# Patient Record
Sex: Female | Born: 1960
Health system: Southern US, Community
[De-identification: ages and names within clinical notes are randomized; demographics above are authoritative.]

## PROBLEM LIST (undated history)

## (undated) DIAGNOSIS — Z9889 Other specified postprocedural states: Secondary | ICD-10-CM

## (undated) DIAGNOSIS — N390 Urinary tract infection, site not specified: Secondary | ICD-10-CM

## (undated) DIAGNOSIS — J449 Chronic obstructive pulmonary disease, unspecified: Secondary | ICD-10-CM

## (undated) DIAGNOSIS — I890 Lymphedema, not elsewhere classified: Secondary | ICD-10-CM

## (undated) DIAGNOSIS — T8859XA Other complications of anesthesia, initial encounter: Secondary | ICD-10-CM

## (undated) DIAGNOSIS — K224 Dyskinesia of esophagus: Secondary | ICD-10-CM

## (undated) DIAGNOSIS — I480 Paroxysmal atrial fibrillation: Secondary | ICD-10-CM

## (undated) DIAGNOSIS — G4730 Sleep apnea, unspecified: Secondary | ICD-10-CM

## (undated) DIAGNOSIS — IMO0002 Reserved for concepts with insufficient information to code with codable children: Secondary | ICD-10-CM

## (undated) DIAGNOSIS — D649 Anemia, unspecified: Secondary | ICD-10-CM

## (undated) DIAGNOSIS — L4050 Arthropathic psoriasis, unspecified: Secondary | ICD-10-CM

## (undated) DIAGNOSIS — J189 Pneumonia, unspecified organism: Secondary | ICD-10-CM

## (undated) DIAGNOSIS — I10 Essential (primary) hypertension: Secondary | ICD-10-CM

## (undated) DIAGNOSIS — Z87898 Personal history of other specified conditions: Secondary | ICD-10-CM

## (undated) DIAGNOSIS — K3184 Gastroparesis: Secondary | ICD-10-CM

## (undated) DIAGNOSIS — R112 Nausea with vomiting, unspecified: Secondary | ICD-10-CM

## (undated) DIAGNOSIS — F32A Depression, unspecified: Secondary | ICD-10-CM

## (undated) DIAGNOSIS — M797 Fibromyalgia: Secondary | ICD-10-CM

## (undated) DIAGNOSIS — E119 Type 2 diabetes mellitus without complications: Secondary | ICD-10-CM

## (undated) DIAGNOSIS — I499 Cardiac arrhythmia, unspecified: Secondary | ICD-10-CM

## (undated) DIAGNOSIS — M1990 Unspecified osteoarthritis, unspecified site: Secondary | ICD-10-CM

## (undated) DIAGNOSIS — G894 Chronic pain syndrome: Secondary | ICD-10-CM

## (undated) DIAGNOSIS — K219 Gastro-esophageal reflux disease without esophagitis: Secondary | ICD-10-CM

## (undated) DIAGNOSIS — K589 Irritable bowel syndrome without diarrhea: Secondary | ICD-10-CM

## (undated) DIAGNOSIS — M069 Rheumatoid arthritis, unspecified: Secondary | ICD-10-CM

## (undated) DIAGNOSIS — N319 Neuromuscular dysfunction of bladder, unspecified: Secondary | ICD-10-CM

## (undated) DIAGNOSIS — F329 Major depressive disorder, single episode, unspecified: Secondary | ICD-10-CM

## (undated) HISTORY — PX: BACK SURGERY: SHX140

## (undated) HISTORY — PX: CHOLECYSTECTOMY: SHX55

## (undated) HISTORY — DX: Dyskinesia of esophagus: K22.4

## (undated) HISTORY — PX: KNEE SURGERY: SHX244

## (undated) HISTORY — DX: Lymphedema, not elsewhere classified: I89.0

## (undated) HISTORY — DX: Gastro-esophageal reflux disease without esophagitis: K21.9

## (undated) HISTORY — DX: Reserved for concepts with insufficient information to code with codable children: IMO0002

## (undated) HISTORY — DX: Unspecified osteoarthritis, unspecified site: M19.90

## (undated) HISTORY — PX: LUNG BIOPSY: SHX232

## (undated) HISTORY — PX: APPENDECTOMY: SHX54

## (undated) HISTORY — DX: Neuromuscular dysfunction of bladder, unspecified: N31.9

## (undated) HISTORY — DX: Irritable bowel syndrome, unspecified: K58.9

## (undated) HISTORY — PX: CARDIAC CATHETERIZATION: SHX172

## (undated) HISTORY — DX: Gastroparesis: K31.84

## (undated) HISTORY — DX: Chronic obstructive pulmonary disease, unspecified: J44.9

## (undated) HISTORY — DX: Paroxysmal atrial fibrillation: I48.0

## (undated) HISTORY — DX: Fibromyalgia: M79.7

## (undated) HISTORY — PX: COLONOSCOPY: SHX174

## (undated) HISTORY — DX: Essential (primary) hypertension: I10

## (undated) HISTORY — DX: Personal history of other specified conditions: Z87.898

## (undated) HISTORY — DX: Type 2 diabetes mellitus without complications: E11.9

## (undated) HISTORY — DX: Other specified postprocedural states: Z98.890

## (undated) HISTORY — PX: ABDOMINAL HYSTERECTOMY: SHX81

---

## 1994-01-02 HISTORY — PX: CHOLECYSTECTOMY: SHX55

## 1995-01-03 HISTORY — PX: ABDOMINAL HYSTERECTOMY: SHX81

## 1995-01-03 HISTORY — PX: KNEE SURGERY: SHX244

## 1997-09-02 HISTORY — PX: APPENDECTOMY: SHX54

## 2000-03-12 ENCOUNTER — Encounter: Payer: Self-pay | Admitting: Cardiovascular Disease

## 2000-03-12 ENCOUNTER — Inpatient Hospital Stay (HOSPITAL_COMMUNITY): Admission: EM | Admit: 2000-03-12 | Discharge: 2000-03-14 | Payer: Self-pay | Admitting: Emergency Medicine

## 2000-11-02 ENCOUNTER — Ambulatory Visit (HOSPITAL_COMMUNITY): Admission: RE | Admit: 2000-11-02 | Discharge: 2000-11-02 | Payer: Self-pay | Admitting: *Deleted

## 2001-07-12 ENCOUNTER — Ambulatory Visit (HOSPITAL_COMMUNITY): Admission: RE | Admit: 2001-07-12 | Discharge: 2001-07-12 | Payer: Self-pay | Admitting: Internal Medicine

## 2001-07-25 ENCOUNTER — Encounter: Payer: Self-pay | Admitting: Anesthesiology

## 2001-07-25 ENCOUNTER — Ambulatory Visit (HOSPITAL_COMMUNITY): Admission: RE | Admit: 2001-07-25 | Discharge: 2001-07-25 | Payer: Self-pay | Admitting: Anesthesiology

## 2002-03-09 ENCOUNTER — Ambulatory Visit (HOSPITAL_COMMUNITY): Admission: RE | Admit: 2002-03-09 | Discharge: 2002-03-09 | Payer: Self-pay | Admitting: Neurosurgery

## 2003-03-09 ENCOUNTER — Ambulatory Visit (HOSPITAL_COMMUNITY): Admission: RE | Admit: 2003-03-09 | Discharge: 2003-03-09 | Payer: Self-pay | Admitting: Neurosurgery

## 2003-03-12 ENCOUNTER — Emergency Department (HOSPITAL_COMMUNITY): Admission: EM | Admit: 2003-03-12 | Discharge: 2003-03-13 | Payer: Self-pay | Admitting: Emergency Medicine

## 2003-03-13 ENCOUNTER — Inpatient Hospital Stay (HOSPITAL_COMMUNITY): Admission: AD | Admit: 2003-03-13 | Discharge: 2003-03-13 | Payer: Self-pay | Admitting: Cardiology

## 2003-08-17 ENCOUNTER — Ambulatory Visit (HOSPITAL_COMMUNITY): Admission: RE | Admit: 2003-08-17 | Discharge: 2003-08-17 | Payer: Self-pay | Admitting: Internal Medicine

## 2004-05-20 ENCOUNTER — Ambulatory Visit: Payer: Self-pay | Admitting: Gastroenterology

## 2004-06-28 ENCOUNTER — Ambulatory Visit: Payer: Self-pay | Admitting: Internal Medicine

## 2004-08-30 ENCOUNTER — Ambulatory Visit: Payer: Self-pay | Admitting: Cardiology

## 2004-09-28 ENCOUNTER — Ambulatory Visit: Payer: Self-pay | Admitting: Internal Medicine

## 2004-10-12 ENCOUNTER — Encounter: Payer: Self-pay | Admitting: Internal Medicine

## 2004-10-12 ENCOUNTER — Ambulatory Visit (HOSPITAL_COMMUNITY): Admission: RE | Admit: 2004-10-12 | Discharge: 2004-10-12 | Payer: Self-pay | Admitting: Internal Medicine

## 2004-10-12 ENCOUNTER — Ambulatory Visit: Payer: Self-pay | Admitting: Internal Medicine

## 2004-10-12 HISTORY — PX: ESOPHAGOGASTRODUODENOSCOPY: SHX1529

## 2005-01-02 HISTORY — PX: LUNG BIOPSY: SHX232

## 2005-05-02 ENCOUNTER — Ambulatory Visit (HOSPITAL_COMMUNITY): Admission: RE | Admit: 2005-05-02 | Discharge: 2005-05-02 | Payer: Self-pay | Admitting: Thoracic Surgery

## 2005-05-02 ENCOUNTER — Encounter (INDEPENDENT_AMBULATORY_CARE_PROVIDER_SITE_OTHER): Payer: Self-pay | Admitting: *Deleted

## 2005-11-21 ENCOUNTER — Ambulatory Visit (HOSPITAL_COMMUNITY): Admission: RE | Admit: 2005-11-21 | Discharge: 2005-11-21 | Payer: Self-pay | Admitting: Neurosurgery

## 2006-03-28 ENCOUNTER — Ambulatory Visit (HOSPITAL_COMMUNITY): Admission: RE | Admit: 2006-03-28 | Discharge: 2006-03-28 | Payer: Self-pay | Admitting: Endocrinology

## 2007-06-13 ENCOUNTER — Ambulatory Visit: Payer: Self-pay | Admitting: Internal Medicine

## 2007-06-19 ENCOUNTER — Encounter (HOSPITAL_COMMUNITY): Admission: RE | Admit: 2007-06-19 | Discharge: 2007-07-19 | Payer: Self-pay | Admitting: Internal Medicine

## 2007-06-26 ENCOUNTER — Ambulatory Visit: Payer: Self-pay | Admitting: Internal Medicine

## 2007-07-11 ENCOUNTER — Ambulatory Visit: Payer: Self-pay | Admitting: Internal Medicine

## 2007-07-30 ENCOUNTER — Ambulatory Visit: Payer: Self-pay | Admitting: Gastroenterology

## 2007-08-06 ENCOUNTER — Ambulatory Visit: Payer: Self-pay | Admitting: Internal Medicine

## 2007-09-10 ENCOUNTER — Ambulatory Visit (HOSPITAL_COMMUNITY): Admission: RE | Admit: 2007-09-10 | Discharge: 2007-09-10 | Payer: Self-pay | Admitting: Neurosurgery

## 2007-10-02 ENCOUNTER — Inpatient Hospital Stay (HOSPITAL_COMMUNITY): Admission: RE | Admit: 2007-10-02 | Discharge: 2007-10-07 | Payer: Self-pay | Admitting: Neurosurgery

## 2007-10-02 DIAGNOSIS — R7309 Other abnormal glucose: Secondary | ICD-10-CM | POA: Insufficient documentation

## 2007-10-02 DIAGNOSIS — E1143 Type 2 diabetes mellitus with diabetic autonomic (poly)neuropathy: Secondary | ICD-10-CM | POA: Insufficient documentation

## 2007-10-02 DIAGNOSIS — K3184 Gastroparesis: Secondary | ICD-10-CM

## 2007-10-02 DIAGNOSIS — IMO0002 Reserved for concepts with insufficient information to code with codable children: Secondary | ICD-10-CM | POA: Insufficient documentation

## 2007-10-02 DIAGNOSIS — E739 Lactose intolerance, unspecified: Secondary | ICD-10-CM | POA: Insufficient documentation

## 2007-10-02 DIAGNOSIS — J4489 Other specified chronic obstructive pulmonary disease: Secondary | ICD-10-CM | POA: Insufficient documentation

## 2007-10-02 DIAGNOSIS — F329 Major depressive disorder, single episode, unspecified: Secondary | ICD-10-CM | POA: Insufficient documentation

## 2007-10-02 DIAGNOSIS — M1990 Unspecified osteoarthritis, unspecified site: Secondary | ICD-10-CM | POA: Insufficient documentation

## 2007-10-02 DIAGNOSIS — IMO0001 Reserved for inherently not codable concepts without codable children: Secondary | ICD-10-CM | POA: Insufficient documentation

## 2007-10-02 DIAGNOSIS — G4733 Obstructive sleep apnea (adult) (pediatric): Secondary | ICD-10-CM | POA: Insufficient documentation

## 2007-10-02 DIAGNOSIS — K589 Irritable bowel syndrome without diarrhea: Secondary | ICD-10-CM | POA: Insufficient documentation

## 2007-10-02 DIAGNOSIS — I201 Angina pectoris with documented spasm: Secondary | ICD-10-CM | POA: Insufficient documentation

## 2007-10-02 DIAGNOSIS — K219 Gastro-esophageal reflux disease without esophagitis: Secondary | ICD-10-CM | POA: Insufficient documentation

## 2007-10-02 DIAGNOSIS — R1110 Vomiting, unspecified: Secondary | ICD-10-CM | POA: Insufficient documentation

## 2007-10-02 DIAGNOSIS — J449 Chronic obstructive pulmonary disease, unspecified: Secondary | ICD-10-CM | POA: Insufficient documentation

## 2007-11-19 ENCOUNTER — Ambulatory Visit: Payer: Self-pay | Admitting: Internal Medicine

## 2008-05-15 ENCOUNTER — Encounter: Payer: Self-pay | Admitting: Internal Medicine

## 2008-06-02 ENCOUNTER — Ambulatory Visit: Payer: Self-pay | Admitting: Cardiology

## 2008-06-09 ENCOUNTER — Ambulatory Visit: Payer: Self-pay | Admitting: Cardiology

## 2008-06-14 ENCOUNTER — Encounter: Payer: Self-pay | Admitting: Cardiology

## 2008-06-29 ENCOUNTER — Encounter: Payer: Self-pay | Admitting: Physician Assistant

## 2008-06-29 ENCOUNTER — Ambulatory Visit: Payer: Self-pay | Admitting: Cardiology

## 2008-09-16 DIAGNOSIS — R079 Chest pain, unspecified: Secondary | ICD-10-CM | POA: Insufficient documentation

## 2008-10-24 ENCOUNTER — Encounter: Payer: Self-pay | Admitting: Cardiology

## 2010-01-02 HISTORY — PX: HAND SURGERY: SHX662

## 2010-01-23 ENCOUNTER — Encounter: Payer: Self-pay | Admitting: Neurological Surgery

## 2010-01-24 ENCOUNTER — Encounter: Payer: Self-pay | Admitting: Neurosurgery

## 2010-02-04 ENCOUNTER — Other Ambulatory Visit (HOSPITAL_COMMUNITY): Payer: Self-pay | Admitting: Neurosurgery

## 2010-02-04 DIAGNOSIS — M545 Low back pain, unspecified: Secondary | ICD-10-CM

## 2010-02-04 DIAGNOSIS — M542 Cervicalgia: Secondary | ICD-10-CM

## 2010-02-18 ENCOUNTER — Other Ambulatory Visit (HOSPITAL_COMMUNITY): Payer: Self-pay

## 2010-03-08 ENCOUNTER — Other Ambulatory Visit (HOSPITAL_COMMUNITY): Payer: Self-pay

## 2010-03-08 ENCOUNTER — Ambulatory Visit (HOSPITAL_COMMUNITY)
Admission: RE | Admit: 2010-03-08 | Discharge: 2010-03-08 | Disposition: A | Discharge status: Home / Self Care | Payer: Medicare Other | Source: Ambulatory Visit | Attending: Neurosurgery | Admitting: Neurosurgery

## 2010-03-08 DIAGNOSIS — M542 Cervicalgia: Secondary | ICD-10-CM

## 2010-03-08 DIAGNOSIS — M545 Low back pain, unspecified: Secondary | ICD-10-CM

## 2010-03-08 DIAGNOSIS — Q761 Klippel-Feil syndrome: Secondary | ICD-10-CM | POA: Insufficient documentation

## 2010-03-08 DIAGNOSIS — R209 Unspecified disturbances of skin sensation: Secondary | ICD-10-CM | POA: Insufficient documentation

## 2010-03-08 DIAGNOSIS — M5126 Other intervertebral disc displacement, lumbar region: Secondary | ICD-10-CM | POA: Insufficient documentation

## 2010-03-08 DIAGNOSIS — M4802 Spinal stenosis, cervical region: Secondary | ICD-10-CM | POA: Insufficient documentation

## 2010-03-08 DIAGNOSIS — Z981 Arthrodesis status: Secondary | ICD-10-CM | POA: Insufficient documentation

## 2010-03-08 MED ORDER — IOHEXOL 300 MG/ML  SOLN: 10.0000 mL | Freq: Once | INTRAMUSCULAR | Status: DC | PRN

## 2010-05-17 NOTE — Assessment & Plan Note (Signed)
NAMEMarland Kitchen  MERTIS, MOSHER                CHART#:  [id number]   DATE:  07/11/2007                       DOB:  02-Jun-1960   CHIEF COMPLAINT:  Vomiting.   SUBJECTIVE:  The patient is a pleasant, morbidly obese Caucasian female  who presents today in followup.  She was last seen on June 13, 2007.  She has a long history of gastroesophageal reflux disease and IBS.  She  had recently come off her Reglan due to tremors.  She had noticed since  that time, intermittent nausea and vomiting.  She also had bright red  reflux symptoms.  It was felt that she likely had underlying  gastroparesis, which was medication induced.  She had a solid-phase  gastric emptying study, which revealed 99% of the radiotracer remaining  in the stomach at 2 hours.  She also had LFTs done, which were normal  with a history of fatty liver.  Three Hemoccults were negative.  She has  tried gastroparesis diet.  She eats multiple small meals daily, but  continues to have intermittent vomiting, not daily, but several days a  week.  She also complains of early a.m. nausea even prior to meals or  medications.  Heartburn is not much of an issue at this point in time.  She is on Prilosec 20 mg b.i.d.  She continues to have some postprandial  vomiting up to and hour or hour and a half after meals consisting of  undigested food.  Denies any dysphagia or odynophagia, constipation,  diarrhea, melena, or rectal bleeding.   CURRENT MEDICATIONS:  Multiple, please see chart.   ALLERGIES:  Levsin and Reglan.   PHYSICAL EXAMINATION:  VITAL SIGNS:  Weight 280 pounds and stable, temp  98.1, blood pressure 110/60, and pulse 88.  GENERAL:  Pleasant, obese Caucasian female in no acute distress.  SKIN:  Warm and dry.  No jaundice.  HEENT:  Sclerae nonicteric.  ABDOMEN:  Massively obese.  Positive bowel sounds.  Soft and nontender.  EXTREMITIES:  Trace edema bilaterally.   IMPRESSION:  The patient is a pleasant, morbidly obese 50 year old  lady  with a long-standing gastroesophageal reflux disease with persistent  intermittent nausea and vomiting.  She had very abnormal gastric  emptying study basically with 99% of the trace were present at 2 hours.  She is unable to tolerate Reglan due to tremor, but had been doing very  well on Reglan with regards to her gastroesophageal reflux disease and  vomiting.  She would likely benefit from erythromycin.  She is on  multiple medications, which of course can negatively impact gastric  motility as well as potentially interact with erythromycin.  I have  discussed with her extensively the potential interactions with her  medications.  She was advised to absolutely not take Phenergan with  erythromycin as it is an absolute contraindication.  We would give her  as low dose as possible that control  her symptoms.  If she has any  questions or notices any side effects, etc., she will stop erythromycin  and call us.   PLAN:  1. Erythromycin ethyl succinate 80 mg q.a.c., 2-week supply with zero      refills, do not take Phenergan.  2. Office visit in 2 weeks with Dr. Jena Gauss.  3. If she does not respond to EES she may need  EGD or UGI to rule out      obstruction as potential cause of delayed gastric empyting.  4. Further recommendations to follow.       Tana Coast, P.A.  Electronically Signed     Kassie Mends, M.D.  Electronically Signed    LL/MEDQ  D:  07/11/2007  T:  07/12/2007  Job:  [id number]   cc:   Doreen Beam, MD

## 2010-05-17 NOTE — Assessment & Plan Note (Signed)
NAMEMarland Kitchen  CRISTAL, QADIR                CHART#:  [id number]   DATE:  11/19/2007                       DOB:  February 16, 1960   SUBJECTIVE:  The patient is here for followup visit.  She was last seen  back on August 06, 2007, by Dr. Jena Gauss.  She has a history of  gastroesophageal reflux disease, gastroparesis, and IBS.  Since we last  saw her, she has had a back surgery with spinal fusion of L3-S1.  She is  temporarily on Percocet for pain.  She states she actually has less  problems with her gastroparesis.  She has had less frequent nausea, but  does tend to have early morning nausea fairly regularly.  She is able to  eat throughout the day, however.  She has had no vomiting.  Her reflux  is pretty well controlled.  She did not see any benefits to the  erythromycin 125 mg q.a.c.  She states she actually felt a little worse  on the medication.  She had a lot of problems taking the liquid and felt  more nauseated.  She continues to have alternating constipation and  diarrhea.  She may go 3 days without any bowel movement, then have a day  of 7-day loose stools and a lot of abdominal cramping.  Currently, she  is not taking her MiraLax or lactulose.  She takes Bentyl about 2-3  times a day on the day she has diarrhea.   CURRENT MEDICATIONS:  Please see updated list.   ALLERGIES:  Levsin causes tachycardia and Reglan causes tremor.   PHYSICAL EXAMINATION:  VITAL SIGNS:  Weight 294, up 9 pounds.  Temp  97.9, blood pressure 122/72, and pulse 80.  GENERAL:  A pleasant, obese Caucasian female in no acute distress.  She  is accompanied by her CNA.  SKIN:  Warm and dry.  No jaundice.  HEENT:  Sclerae nonicteric.  Oropharyngeal mucosa moist and pink.  ABDOMEN:  Positive bowel sounds.  Abdomen is obese, soft, and nontender.  No organomegaly or masses.  No rebound or guarding.  No abdominal bruits  or hernias, somewhat limited due to body habitus.  LOWER EXTREMITIES:  Trace pedal edema bilaterally.   IMPRESSION:  The patient is a 50 year old lady with history of  gastroesophageal reflux disease and gastroparesis.  Gastroparesis felt  to be multifactorial and drug-induced in origin.  She continues to have  intermittent nausea, but as not as frequent vomiting.  She was not able  to tolerate Reglan due to side effects.  She developed tremors on the  medication.  She did not appreciate any benefit on erythromycin  ethylsuccinate suspension at 125 mg q.a.c.  I am not sure what else we  have to offer at this point.  There are so many of her medications that  are impeding her gastric emptying and gastrointestinal motility, which  is making it very difficult to overcome.  I discussed with Dr. Jena Gauss a  potential of referral to [facility] ([place]  for evaluation.  Domperidone may be an option, given her polypharmacy  would be concerned with potential drug interactions with any additional  medication.  She continues to have swings between constipation, diarrhea  likely due to her irritable bowel syndrome and polypharmacy as well.   PLAN:  1. MiraLax 17 g daily.  She is to take in the evening time everyday      unless she has diarrhea that day.  She may continue to use Bentyl 2-      3 times daily on days that she has abdominal cramps and diarrhea.      Seven days samples and coupon provided for MiraLax.  2. We will discuss further with Dr. Jena Gauss and potentially make      referral to [facility] to help      with management for her severe gastroparesis.       Tana Coast, P.A.  Electronically Signed     R. Roetta Sessions, M.D.  Electronically Signed    LL/MEDQ  D:  11/19/2007  T:  11/19/2007  Job:  [id number]   cc:   Kirstie Peri, MD

## 2010-05-17 NOTE — Op Note (Signed)
[name] [name], [name] [name]               ACCOUNT NO.:  [id number]   MEDICAL RECORD NO.:  [id number]          PATIENT TYPE:  INP   LOCATION:  3032                         FACILITY:  [facility]   PHYSICIAN:  Cristi Loron, M.D.DATE OF BIRTH:  03/23/1960   DATE OF PROCEDURE:  10/02/2007  DATE OF DISCHARGE:                               OPERATIVE REPORT   BRIEF HISTORY:  The patient is a 50 year old white female who has  previously undergone a L4-5 and L5-S1 lumbar decompression and fusion by  another physician in 1991.  The patient had some chronic back pain but  more recently it has increased and she has symptoms consistent with  neurogenic claudication.  I worked the patient up with a lumbar Myo/CT  which demonstrated that the patient may have had a pseudoarthrosis at L4-  5 and possibly L5-S1 and has developed significant adjacent segment  degenerative changes at L3-4 with spondylolisthesis and spinal stenosis  and facet arthropathy.  I discussed various treatment options with the  patient including surgery.  She has weighed the risks, benefits and  alternatives of surgery and decided to proceed with exploration of a  lumbar fusion with a decompression, fusion and instrumentation.   PREOPERATIVE DIAGNOSES:  L4-5 and possible L5-S1 pseudoarthrosis; L3-4  degenerative disk disease, stenosis, lumbar radiculopathy, myelopathy,  and lumbago.   POSTOPERATIVE DIAGNOSES:  L4-5 and possible L5-S1 pseudoarthrosis; L3-4  degenerative disk disease, stenosis, lumbar radiculopathy, myelopathy,  and lumbago.   PROCEDURE:  Exploration of lumbar fusion; removal of old  Steffee  instrumentation; bilateral L3-L4 laminotomies and foraminotomies to  decompress the bilateral L3, L4, and L5 nerve roots; L3-4 and L4-5  transforaminal lumbar interbody fusion with local morselized autograft  bone and Vitoss/Actifuse bone graft extender; insertion of L3-4 and L4-5  interbody prosthesis (PEEK interbody  prosthesis); posterior segmental  instrumentation L3 to S1 with Legacy titanium pedicle screws and rods;  L3-4, L4-5 and L5-S1 posterolateral arthrodesis with local morselized  autograft bone, Actifuse and Vitoss bone graft extenders as well as bone  morphogenic protein soaked collagen sponges.   SURGEON:  Cristi Loron, MD   ASSISTANT:  Hewitt Shorts, MD   ANESTHESIA:  General endotracheal.   ESTIMATED BLOOD LOSS:  600 mL.   SPECIMENS:  None.   DRAINS:  None.   COMPLICATIONS:  None.   DESCRIPTION OF PROCEDURE:  The patient was brought to the operating room  by the anesthesia team.  General endotracheal anesthesia was induced.  The patient was turned to the prone position on the Wilson frame.  Her  lumbosacral region was then prepared with Betadine scrub and Betadine  solution.  Sterile drapes were applied.  I then injected the area to be  incised with Marcaine with epinephrine solution, used a scalpel to make  a linear midline incision through the patient's previous lumbar scar.  I  used electrocautery to perform a bilateral subperiosteal dissection  exposing the spinous process lamina of L2, L3, L4, L5 in the upper  sacrum.  Then we used electrocautery to expose the old hardware.  We  then inserted the Versa-Trac  retractor for exposure.  We began the  surgery by exploring the arthrodesis.  We used the various wrenches to  remove the old Steffee hardware.  This was quite time consuming.  As  expected, we noted that the bilateral L4 pedicles screws were fractured.  We removed the screw heads but could not see the deeper pedicle screw  within the vertebral body/pedicles.  Once we removed the hardware, we  inspected the arthrodesis.  The patient appeared to have pseudoarthrosis  at L4-5 with some motion but we could not tell whether L5-S1 was fused  or not.   We now turned our attention to the decompression.  Because of the  patient's severe facet arthropathy at L3-4  and L4-5, we need to do a  wide decompression of the thecal sac and the nerve roots.  I removed the  medial aspect of the facets and performed wide foraminotomies about the  bilateral L3, L4 and L5 nerve roots.  We used a high-speed drill to  perform a right L3 laminotomy and bilateral L4 laminotomies.  We widened  the lamina with Kerrison punches and carefully dissected through the  epidural fibrosis and decompressing the thecal sac and performed  foraminotomies about the bilateral L3, L4 and L5 nerve roots.  We were  able to decompress the bilateral nerve roots at L3-4 via unilateral  laminotomy by reaching across at midline and carefully reached across  the  midline with the Kerrison punches..   Having completed the decompression, we now turned our attention to the  transforaminal lumbar interbody fusion, removed the inferior facet at L3-  L4 and this gave [place] wide lateral exposure to the L3-4 and L4-5  intervertebral disk.  We incised these disks with the 15 blade scalpel  and performed a partial intervertebral diskectomy with the pituitary  forceps and Epstein curettes.  We prepared the vertebral endplates for  the fusion by removing all soft tissues using the curettes.  We used a  trial spacer to determine it using a 10 x 26 spacer at L4-5 and a 12 x  26 at L3-4.  We prefilled this prosthesis with a combination of BMP,  local autograft bone, Actifuse and Vitoss.  We also used these  substances to fill in the disk space ventrally at L3-4 and L4-5.  We  inserted the prosthesis into the interspace from the right side of  course after retracting neural structures out of harm's way.  We then  used a bone tap to turn the prosthesis laterally and then filled the  posterior disk space with local autograft bone, Vitoss, Actifuse and  bone morphogenic protein soaked collagen sponges.  This completed the  transforaminal lumbar interbody fusion.   We now turned our attention to  instrumentation.  We cannulated the  bilateral L3 pedicles with the bone probes under fluoroscopic guidance.  We tapped the pedicles with 5.5 Miller tap, probed inside the tapped  pedicles around cortical breeches and then inserted  7.5 x 55 mm pedicle  screws bilaterally at L3 and 7.5 x 50 bilaterally at L5 and 7.5 x 45  bilaterally at S1 but we did not attempt to put screws in the L4  pedicles because the drains has broken screws from old surgery.  We then  connected unilateral pedicle screws with a lordotic rod.  We compressed  the construct and then secured the rod in place with the caps, then  placed a cross connector and tightened it appropriately.  This completed  instrumentation.  We palpated along the medial aspect of the L3 and L4  pedicles and noted there was no cortical breeches and the nerve roots  were not injured.   We now turned our attention to arthrodesis.  We used a high-speed drill  to decorticate the remainder of the left L3-4, L4-5 and L5-S1 facets,  pars region, lateral masses and old fusion mass.  We then laid the bone  morphogenic protein soaked collagen sponges over these decorticated  posterolateral structures and then laid local autograft bone, Vitoss and  Actifuse over the BMP.  This completed the posterolateral arthrodesis.  We then inspected the thecal sac and noted that the thecal sac was well  decompressed at L3-4, L4-5 and about L3, L4 and L5 nerve roots were well  decompressed.  We then obtained hemostasis using bipolar cautery.  We  removed the retractor and then reapproximated the patient's  thoracolumbar fascia with interrupted #1 Vicryl suture, subcutaneous  with interrupted 2-0 Vicryl suture and the skin with Steri-Strips and  benzoin.  The wound was then coated with bacitracin ointment.  A sterile dressing was applied.  The drapes were removed and the patient  was subsequently returned to supine position where she was extubated by  the anesthesia  team and transported to the postanesthesia care unit in  stable condition.  All sponge, instrument and needle counts were correct  at the end of this case.      Cristi Loron, M.D.  Electronically Signed     JDJ/MEDQ  D:  10/03/2007  T:  10/04/2007  Job:  [id number]

## 2010-05-17 NOTE — Discharge Summary (Signed)
NAMELENOIR, FACCHINI               ACCOUNT NO.:  [id number]   MEDICAL RECORD NO.:  [id number]          PATIENT TYPE:  INP   LOCATION:  3032                         FACILITY:  [facility]   PHYSICIAN:  Cristi Loron, M.D.DATE OF BIRTH:  Dec 04, 1960   DATE OF ADMISSION:  10/02/2007  DATE OF DISCHARGE:  10/07/2007                               DISCHARGE SUMMARY   BRIEF HISTORY:  The patient is a 51 year old white female who has  previously undergone L4-L5, L5-S1 lumbar decompression and fusion by  another physician in 1991.  The patient has had chronic back pain, but  more recently it has increased and has developed symptoms consistent  with neurogenic claudication.  I have ordered the patient now for lumbar  myelo-CT, which demonstrated the patient may have osteoarthritis of L4-  L5 and possibly L5-S1 and has developed significant degenerative changes  at L3-L4 with spondylolisthesis, spinal stenosis, and facet arthropathy.  I discussed various treatment options with the patient.  She has weighed  the risk, benefits, alternatives of the surgery and decided to proceed  with an exploration of lumbar fusion with decompression, fusion, and  instrumentation of L3-L4.   For further details of this admission, please refer typed history and  physical.   HOSPITAL COURSE:  Admitted the patient to [facility] on  October 02, 2007.  On the day of admission, I performed a L3-L4  decompression, instrumentation, and fusion.  The surgery went well (for  full details of this operation, please refer to operative note).   POSTOPERATIVE COURSE:  The patient's postoperative course was  unremarkable.  We had PT/OT see the patient.   By October 07, 2007, the patient was afebrile.  Vital signs were stable.  She was eating well and ambulating well and her wound was healing well  without signs of infections, although she did have some minimal  serosanguineous discharge.  The patient was discharged  home on October 07, 2007.   DISCHARGE INSTRUCTIONS:  The patient was instructed to follow up with me  in 4 weeks.  She was given written discharge instructions.   DISCHARGE PRESCRIPTIONS:  1. Percocet 10/325 #100 one p.o. q.4 h. p.r.n. pain.  2. Valium 5 mg #15 one p.o. q.6-8 h. p.r.n. for muscle spasm.  3. Keflex 500 mg #20 one p.o. q.i.d.   FINAL DIAGNOSES:  Lumbar pseudoarthrosis and L3-L4 degenerative disk  disease, stenosis, lumbar radiculopathy and myelopathy, lumbago.   PROCEDURE PERFORMED:  Exploration of lumbar fusion; removal of old  Steffe instrumentation; bilateral L3-L4 laminotomies, foraminotomies,  decompression of bilateral L3-L4 nerve roots; L3-L4, L4-L5  transforaminal lumbar interbody fusion with local morselized autograft  bone and Vitoss and Actifuse bone graft extenders; insertion of L3-  L4 and L4-L5 interbody prosthesis (PEEK interbody prosthesis) posterior  segmental instrumentation L3 through S1 with Legacy titanium pedicle  screws and rods; L3-L4 and L4-L5 posterior lateral arthrodesis with  local morselized autograft bone, and Actifuse and Vitoss bone graft  extender.      Cristi Loron, M.D.  Electronically Signed     JDJ/MEDQ  D:  11/14/2007  T:  11/15/2007  Job:  [id number]

## 2010-05-17 NOTE — Assessment & Plan Note (Signed)
[facility]                          [name] CARDIOLOG[facility] OFFICE NOTE   [name], [name] [name]                      MRN:          [id number]  DATE:06/29/2008                            DOB:          1960/08/09    PRIMARY CARDIOLOGIST:  Learta Codding, MD, [facility]   REASON FOR VISIT:  Scheduled followup.  Please refer to Dr. Margarita Mail  office note of June 02, 2008, for complete details.   At that time, the patient was referred for a dobutamine stress  echocardiogram for risk stratification.  This was interpreted as  entirely within normal limits.  No medication adjustments were made.   Ms. [name] continues to have intermittent chest pain; of note, this is  completely unpredictable in onset, although it does respond to  sublingual nitroglycerin.  Ms. [name] also suggests that the recent up  titration of Imdur, per Dr. Sherril Croon, has helped somewhat.  She refers to  having been previously diagnosed with spasm.   The patient has had 2 previous cardiac catheterizations, initially in  1998 revealing normal coronary arteries, by Dr. Viann Fish.  More  recently, Dr. Gerri Spore noted mild, nonobstructive CAD in 2002.  Specifically, there was 40% ostial RCA stenosis with question of  catheter-induced spasm.  There was no residual significant disease in  the other arteries.   CURRENT MEDICATIONS:  Unchanged from previous visit.   PHYSICAL EXAMINATION:  VITAL SIGNS:  Blood pressure 125/82, pulse 95,  regular, and weight 289.8.  GENERAL:  A 50 year old female, morbidly obese, sitting upright, and in  no distress.  HEENT:  Normocephalic, atraumatic.  NECK:  Palpable carotid pulse without bruits; unable to assess JVD,  secondary to neck girth.  LUNGS:  Clear to auscultation in all fields.  HEART:  Regular rate and rhythm.  No significant murmurs.  No rubs or  gallops.  ABDOMEN:  Protuberant, intact bowel sounds.  EXTREMITIES:  Bilateral chronic lower extremity edema,  with mild  erythema.  NEUROLOGIC:  No focal deficit.   IMPRESSION:  1. Noncardiac chest pain.      a.     Recent normal dobutamine stress echocardiogram.      b.     History of normal coronary arteries by previous       catheterizations.      c.     Question proximal RCA vasospasm by cardiac catheterization       in 2002.      d.     Status post recent false positive perfusion imaging study.  2. Normal LVF.  3. GERD/esophageal spasm.  4. Fibromyalgia.  5. Irritable bowel syndrome.  6. Anxiety/depression.  7. Chronic lymphedema.  8. Obstructive sleep apnea.   PLAN:  1. Increase Norvasc to 10 mg daily, for continued attempt in      suppressing possible coronary vasospasm-induced chest pain.      Moreover, the patient suggests a history of esophageal spasm, which      may also be the etiology for her symptoms.  In the future, if she      continues to have breakthrough chest discomfort, then  we may need      to further up titrate her Imdur.  2. Schedule return clinic followup with myself and Dr. Andee Lineman in 3      months.  3. The patient is in the process of being referred to Dr. Lucretia Field of      the [facility] at [facility].      Gene Serpe, PA-C  Electronically Signed      Learta Codding, MD,FACC  Electronically Signed   GS/MedQ  DD: 06/29/2008  DT: 06/30/2008  Job #: [id number]   cc:   Doreen Beam, MD

## 2010-05-17 NOTE — Assessment & Plan Note (Signed)
NAMEMarland Kitchen  [name] [name], [name] [name]                CHART#:  [id number]   DATE:  07/30/2007                       DOB:  11-May-1960   CHIEF COMPLAINT:  Followup for diarrhea and vomiting.   SUBJECTIVE:  The patient is a very pleasant 50 year old lady who I  recently saw on 07/11/2007.  She has a history of gastroparesis and IBS.  She was having intermittent nausea and vomiting felt to be due to  gastroparesis.  She did not tolerate Reglan due to tremors.  When she  came off the Reglan, she noticed that her nausea and vomiting had  returned.  She is on multiple medications, which is felt to be the cause  of her gastroparesis.  At the last office visit, we did decide to try  erythromycin ethylsuccinate at 80 mg q.a.c., a 2-week supply.  She was  advised not to take with Phenergan and the risk of reaction to multiple  of her medications.  She states that it did not seem to help at this  dose.  She continued to have some vomiting intermittently.  She states  that these symptoms are ongoing, but she is mostly concerned now of a 4-  week history of diarrhea, which she failed to tell me about at her last  office visit.  She is having 5 or 6 stools a day.  All of her stools are  loose to watery.  Denies any blood in the stool or melena.  In the past,  she has had some loose stools related to IBS, but not as prolonged as  this.  She denies any nocturnal diarrhea or constipation.  She states  every morning, her nausea is very bad in the morning.  When she gets up  and moves around, she feels fluid jostling around in her stomach.  She  also has a lot of abdominal cramps, which were relieved with bowel  movements, but it takes a while for them to go away.  Sometimes, she  feels like she has a flu.  She doubles over with cramps.  She  currently is holding her MiraLax and lactulose.  She denies any recent  antibiotic use.   CURRENT MEDICATIONS:  Multiple.  Please see chart.   ALLERGIES:  Levsin caused tachycardia  and Reglan caused tremors.   PHYSICAL EXAMINATION:  VITAL SIGNS:  Weight 274, down 6 pounds.  Temp  97.9, blood pressure 118/82, and pulse 60.  GENERAL:  A pleasant, obese, Caucasian female in no acute distress.  She  is accompanied by her aide today.  SKIN:  Warm and dry.  No jaundice.  HEENT:  Sclerae nonicteric.  Oropharyngeal mucosa moist and pink.  ABDOMEN:  Massively obese.  Positive bowel sounds.  Soft and nontender.  No organomegaly or masses appreciated, but limited due to body habitus.  LOWER EXTREMITIES:  Trace edema bilaterally.   IMPRESSION:  The patient is a very pleasant 50 year old lady with  longstanding gastroesophageal reflux disease, abnormal gastric emptying  study with 99% of the tracer present at 2 hours, who presents with  persistent nausea and vomiting as well as 4-week history of diarrhea in  the setting of irritable bowel syndrome.  Unfortunately, she did not  tolerate Reglan due to tremors.  Erythromycin at low dose did not seem  to help.  At this point in  time, we will hold the medication, but may  need to consider trying at 125 mg q.a.c. in the near future.  With  regards to her diarrhea, need to rule out infectious etiology.  May  ultimately be due to irritable bowel syndrome, but symptoms seem to be  more pronounced for her currently.   PLAN:  1. We will check CBC, CMET, and TSH.  2. Stools for C. Diff,  O&P culture, and WBC.  3. Trial of Bentyl 10 mg p.o. t.i.d. p.r.n. diarrhea and abdominal      cramps, #90, 1 refill.  4. She should push the fluids, and for ongoing nausea and vomiting,      try clear liquids or full liquids due to her gastroparesis.  5. Further recommendations to follow.       Tana Coast, P.A.  Electronically Signed     R. Roetta Sessions, M.D.  Electronically Signed    LL/MEDQ  D:  07/30/2007  T:  07/30/2007  Job:  [id number]   cc:   Dr. Sherryll Burger

## 2010-05-17 NOTE — Assessment & Plan Note (Signed)
[facility]                          [name] OFFICE NOTE   ALYCE, INSCORE                      MRN:          [id number]  DATE:06/02/2008                            DOB:          08-10-1960    HISTORY OF PRESENT ILLNESS:  The patient is a 50 year old female with a  history of chronic chest discomfort, fibromyalgia, irritable bowel  syndrome and chronic fatigue syndrome.  The patient had a prior cardiac  catheterization in 1998, which showed normal coronary arteries.  More  recently, however, the patient states that she is been having more chest  pain, which is substernal with some radiation to the left shoulder.  However, this pain is not new and she has had this for quite some time,  but just thinks that now more frequent.  Unfortunately, the patient is  on plethora of pain medications including Duragesic patch and baclofen.  The patient does have a history of anxiety and depression and is  currently taking Wellbutrin.  She continues to have intermittent bowel  symptoms, alternating diarrhea with constipation.  She states she is  under lot of stress and does not sleep very well.  Her son is a  diabetic.  She needs to administer insulin at 3 o'clock in the morning.  The patient's mother also has Alzheimer's and severe arthritis and needs  a lot of help.  The patient states that she has been using more  nitroglycerin and frequently and also Dr. Sherril Croon increased her Imdur down  to 50 mg p.o. daily.  She had a stress test done, which was interpreted  by Dr. Shelva Majestic, which showed mild hypoperfusion to antral apex on rest  compared to stress study.  However, it was suspect that this was breast  attenuation.  Ejection fraction was 39%, but he also was not certain  whether this was actually underestimated.   MEDICATIONS:  1. Prilosec 20 mg p.o. b.i.d.  2. Neurontin 400 mg q.6 h.  3. Imdur 150 mg p.o. daily.  4. Lasix 80 mg p.o. b.i.d.  5.  Nortriptyline 12.5 mg at bedtime.  6. Valium 5 mg p.o. b.i.d.  7. Ecotrin 325 daily.  8. Baclofen 20 mg p.o. t.i.d.  9. Wellbutrin SR 150 p.o. daily.  10.B12 injection 1000 mcg every month.  11.MiraLax p.r.n.  12.Symbicort.  13.K-Dur.  14.Spironolactone 50 mg p.o. t.i.d.  15.Duragesic patch 75 mcg per hour patch for last q.12 h.  16.Loratadine 10 mg p.o. daily.  17.Nitrostat p.r.n.  18.Albuterol p.r.n.  19.Phenergan p.r.n.  20.Maalox p.r.n.  21.O2 with 2 L oxygen at night.  22.Niaspan 500 mg p.o. daily.  23.Multivitamin.  24.Calcium D.  25.Vitamin C.  26.Vitamin E.  27.Vitamin D.   PHYSICAL EXAMINATION:  VITAL SIGNS:  Blood pressure is 142/87, heart  rate 97, weight is 289 pounds.  NECK:  Normal carotid upstroke and no carotid bruits.  LUNGS:  Clear breath sounds bilaterally.  HEART:  Regular rate and rhythm with normal S1 and S2.  No murmur, rubs,  or gallops.  ABDOMEN:  Soft and nontender.  No rebound or guarding.  Good  bowel  sounds.  EXTREMITIES:  No cyanosis, clubbing, or edema.  NEUROLOGIC:  The patient is alert, oriented and grossly nonfocal.   PROBLEM LIST:  1. Chronic chest pain syndrome.      a.     Rule out fibromyalgia.      b.     Rule out costochondritis.      c.     Rule out ischemic heart disease.  2. Lymphedema.  3. Obstructive sleep apnea.  4. Fibromyalgia.  5. Irritable bowel syndrome  6. Anxiety.  7. Depression.  8. Abnormal Cardiolite scan.   PLAN:  1. At this point I suspect that the patient's ejection fraction is      underestimated and that the Cardiolite scan is a false positive      study.  Rather than performing a catheterization, which I think      will be low yield particularly in light of prior negative      catheterization.  I recommended a dobutamine echocardiographic      study.  2. If the study is within normal limits the patient will need to be      referred to a specialty clinic particularly at a functional bowel       disorder in the clinic of Dr. Lucretia Field as she needs to have her      medications consolidated and likely needs to be treated with      combination of antidepressants rather than medication regimen that      consists of multiple narcotics.  Actually the narcotics are making      her bowel symptoms significantly worse.  3. The patient was very receptive to her referral to Functional Bowel      Disorder Clinic and we have made arrangements for this.     Learta Codding, MD,FACC  Electronically Signed    GED/MedQ  DD: 06/02/2008  DT: 06/03/2008  Job #: [id number]

## 2010-05-17 NOTE — Assessment & Plan Note (Signed)
NAMEEDELL, MESENBRINK                CHART#:  [id number]   DATE:  08/06/2007                       DOB:  [birth year]   FOLLOWUP:  Intermittent diarrhea, vomiting, history of gastroparesis  likely drug-induced, last seen on July 30, 2007, history of long-  standing gastroesophageal reflux disease with a markedly delayed gastric  emptying with a recent 5-week history of worsening of nausea and  vomiting and the diarrhea felt to be secondary to irritable bowel  syndrome.  Previously, she was found to be intolerant to [facility] due to  tremors.  She was on erythromycin 80 mg orally a.c., which really has  not helped.  She is taking Phenergan in the morning.  Lab for evaluation  ensued from her last visit.  Stool culture came back negative,  glycophorin and C. diff came back negative, OMP came back negative.  Interestingly, we have her weighing 11 pounds more than she did at her  prior visit last week.  White count slightly up at 12.8, hemoglobin  15.0, and hematocrit 45.8.  BMET with good except for separate glucose  128, TSH 1.268.  She has been taking Bentyl 10 mg t.i.d. which has  slowed the diarrhea.  She tells me other folks around her have been ill  of similar symptoms and we wonder if she would have experienced a viral  syndrome along the way, superimposed on gastroparesis.  She continues  taking Prilosec 20 mg orally b.i.d.  She is on multiple medications  which would impede gastric emptying and GI motility gel for that matter  including nortriptyline, baclofen, and Duragesic patch.  She is followed  by Dr. Thyra Breed, pain management specialist at [facility].   ALLERGIES:  LEVSIN, tachycardia and REGLAN, tremors.   PHYSICAL EXAMINATION:  GENERAL:  She appears in no acute distress.  She  is accompanied by one of her caregivers.  VITAL SIGNS:  Weight 285, height 5 feet 9 inches, temperature 98.4, BP  122/70, and pulse 96.  SKIN:  Warm and dry.  CHEST:  Lungs are clear to  auscultation.  CARDIAC:  Regular rate and rhythm without murmur, gallop, or rub.  ABDOMEN:  Nondistended.  Positive bowel sounds.  No succussion.  Flat,  soft, and nontender,  No appreciable mass or organomegaly.   ASSESSMENT:  History of gastroesophageal reflux disease/gastroparesis,  likely multifactorial drug-induced in origin.  She continues to have  symptoms of gastroparesis with intermittent nausea and vomiting.  She  has certainly not lost any ground as far as her weight is concerned and  should not.  Based on BMET last week, it is a challenge to deal with  gastroparesis with intolerance to Reglan and her polypharmaceutical  regimen.   She may have experienced a recent viral gastroenteritis, which  exacerbated her gastroparesis, in part.   RECOMMENDATIONS:  We will go ahead and bump up the dose of erythromycin  ethylsuccinate suspension 125 mg a.c. (limited to t.i.d.).  I have asked  her to spread her meals out in 4-5 smaller meals daily, but limit  erythromycin dosing to before 3 of these meals.  I have asked her not to  take any Phenergan whatsoever while taking erythromycin given the  potential for drug interactions.  I will see how she does with this  regimen.  I have given her enough erythromycin  for 10 days.  If she  likes, she will call up and she is going to let us know.  She may  continue Bentyl p.r.n. diarrhea.   Plan is to see this lady back in the office in 8 weeks.       Jonathon Bellows, M.D.  Electronically Signed     RMR/MEDQ  D:  08/06/2007  T:  08/07/2007  Job:  [id number]   cc:   Cam Hai, C.N.M.

## 2010-05-17 NOTE — Assessment & Plan Note (Signed)
NAME:  [name] [name], [name] [name]                CHART#:  [id number]   DATE:  06/13/2007                       DOB:  January 03, 1960   This patient is a 50 year old lady, has not been seen here in nearly 3  years.  She is followed primarily with Dr. Sherril Croon in [place].  She has a long  complicated medical history and has well-documented gastroesophageal  reflux disease in the setting of morbid obesity.  In addition to b.i.d.  and t.i.d. proton pump inhibitor therapy, she has been on Reglan for at  least a couple of years.  As she apparently developed a tremor recently,  Dr. Vear Clock, her pain management physician, asked her to stop the  Reglan and tremors improved.  However, her nausea and vomiting has now  become an issue.  She has had these symptoms for 2 to 3 months, which  correlate to cessation of Reglan therapy.  Reflux symptoms have been  well controlled on Prilosec 20 mg orally b.i.d., but they are not as  well controlled nowadays.  No odynophagia and no dysphagia.  No true  early satiety.  She has gained 16.5 pounds since she was weighed here  for instance in 2000.  We last saw her in September 2006, she was felt  to be doing fairly well.  She weighed 316 pounds at that time.  She had  seen Dr. Huston Foley over need for consideration of antireflux  surgery but never followed through.  She has well-documented  gastroesophageal reflux disease on 2 prior pH studies.  There is no  history of Barrett's on prior EGD.  She has not had any melena or rectal  bleeding.  She is not known to have diabetes.  She had a colonoscopy  back in 2003 largely for screening.  She had history of 3 second degree  relatives with colon cancer.  She had internal hemorrhoids only.   PAST MEDICAL HISTORY:  Fibromyalgia and degenerative joint disease,  lower extremity neuropathy, osteoarthritis, and COPD.  She really has  glucose intolerance but no diabetes.  Gastroesophageal reflux disease,  irritable bowel syndrome,  depression, and obstructive sleep apnea.   PAST SURGICAL HISTORY:  Spinal fusion, appendectomy, cholecystectomy,  and hysterectomy, lung biopsy for benign disease, exploratory laparotomy  for endometriosis, and left knee surgery.   CURRENT MEDICATIONS:  1. Prilosec 20 mg b.i.d.  2. Neurontin 400 mg q.6 h.  3. Imdur 120 mg daily.  4. Lasix 80 mg b.i.d.  5. Nortriptyline 25 mg at bedtime.  6. Valium 5 mg one tablet b.i.d.  7. Ecotrin 325 mg daily.  8. Premarin 0.125 mg two capsules daily.  9. Nitrostat 0.4 mg p.r.n.  10.Baclofen 20 mg one tablet t.i.d.  11.Wellbutrin SR 150 mg one tablet b.i.d.  12.B12, 1 mg injection monthly.  13.MiraLax 25 g in 8 ounces of water p.r.n. constipation.  14.Advair Diskus 250/50 inhaler one puff twice daily for asthma.  15.Albuterol 90 mcg 2 puffs p.r.n. asthma.  16.Albuterol nebulizer 4 times daily.  17.Chantix 1 mg b.i.d.  18.K-Dur 20 mEq CR 2 tablets 4 times daily.  19.Spironolactone 50 mg orally t.i.d. for fluid.  20.Duragesic 75 mcg patch every 2 days.  21.Magnesium oxide 400 mg two tablets t.i.d.  22.Metanx one tablet every 12 hours for neuropathy.  23.Loratadine 10 mg one tablet daily.  24.Phenergan 25 mg one tablet a day for nausea.  25.Spiriva inhaler once daily.  26.Lactulose 2 tablespoons as needed for constipation.  27.Diflucan 150 mg once weekly for thrush.  28.Maalox as needed for GERD.  29.Hydroxyzine 25 mg one tablet every 4 hours as needed for rash or      itching.  30.BiPAP when sleeping, 2 L of O2 as needed.  31.Calcium, vitamin D supplement.  32.Vitamin C, vitamin E, and vitamin D daily.   ALLERGIES:  LEVSIN, and REGLAN.   FAMILY HISTORY:  Two aunts and one grandparent with colon cancer at  advanced age.  No first-degree relatives with colon cancer or polyps.  Her father died with heart disease-related problems.  Mother has had  mini stroke, osteoarthritis, and dementia.   SOCIAL HISTORY:  The patient is divorced,  disabled.  She quit smoking in  March of this year.  No alcohol or illicit drugs.   REVIEW OF SYSTEMS:  No recent chest pain, dyspnea on exertion, weight  does fluctuate, but she has been morbidly obese for several years.   PHYSICAL EXAMINATION:  A 50 year old lady resting comfortably.  Weight 279.5, height 5 feet 9 inches, temp 97.9, and BP 110/78, and  pulse 88.  SKIN:  Warm and dry.  There is no jaundice or cutaneous stigmata of  chronic liver disease.  HEENT:  No scleral icterus.  Conjunctivae are pink.  CHEST:  Lungs are clear to auscultation.  CARDIAC:  Regular rate and rhythm without murmur, gallop or rub.  ABDOMEN:  Nondistended.  BREAST:  Deferred.  ABDOMEN:  Massively obese.  Positive bowel sounds.  No succussion  splash.  Abdomen is soft with minimal epigastric tenderness.  No  appreciable mass or organomegaly.  EXTREMITIES:  No edema.   IMPRESSION:  This patient is a pleasant, morbidly obese 50 year old lady  with longstanding gastroesophageal reflux disease with intermittent  nausea and vomiting.  She has been on Reglan for some time as an adjunct  to treat gastroesophageal reflux disease.  She is on multiple  medications, a number of which would negatively impact on gastric  motility.  I am glad the Reglan has been withdrawn from her regimen, and  her tremor has improved.   However, the withdrawal of Reglan is at least temporally related to the  onset of worsening nausea and vomiting symptoms.  I suspect she may have  underlying gastroparesis, which is probably more medication induced than  anything else.  She does have a history of glucose intolerance but  really does not have a history of diabetes.  She has a past family  history of colon cancer in 3 second-degree relatives but not ata young  age, per report.   RECOMMENDATIONS:  We will go ahead and do a solid-phase gastric emptying  study to see where we stand as far as gastric emptying is concerned, and  we  will check hepatic profile and sent her home with 3 mail-in  Hemoccults.  We will make further recommendation in the very near  future.       Jonathon Bellows, M.D.  Electronically Signed     RMR/MEDQ  D:  06/13/2007  T:  06/14/2007  Job:  [id number]   cc:   Doreen Beam, MD

## 2010-05-20 NOTE — Op Note (Signed)
[name] [name], [name] [name]                           ACCOUNT NO.:  [id number]   MEDICAL RECORD NO.:  [id number]                    PATIENT TYPE:   LOCATION:                                       FACILITY:   PHYSICIAN:  R. Roetta Sessions, M.D.              DATE OF BIRTH:  01-Apr-1960   DATE OF PROCEDURE:  08/17/2003  DATE OF DISCHARGE:                                 OPERATIVE REPORT   PROCEDURE:  A 24-hour pH probe study.   REFERRING PHYSICIAN:  R. Roetta Sessions, M.D.   INDICATIONS:  Atypical chest pain, possibly related to gastroesophageal  reflux unresponsive to aggressive acid suppression therapy.   PRIOR STUDIES:  Remotely in April 2000 esophageal pH probe study revealed  markedly abnormal amount of gastroesophageal reflux over a 24-hour period of  time which coordinated well with the patient's reflux symptoms.  In March  2000 EGD revealed a 4-cm hiatal hernia.   METHOD:  The pH electrodes were placed 20 and 5 cm above the proximal border  of the manometrically determined lower esophageal sphincter (LES). These  electrodes were defined as channels 1 and 2, respectively. The data was  converted using the [employer], version 2.30. A reflux  episode was defined as a drop in pH below 4.0. The procedure was reviewed  with the patient prior to performing it.   FINDINGS:  Proximal border of the LES (location from nares):  42 cm.  Study duration:  24 hours.  Channel 2 analysis:  Number of reflux episodes:  114.  Number of reflux episodes longer than 5 minutes:  3  Longest reflux episode:  12 minutes.  Total time pH below 4.0 (min):  142 minutes.  Fraction of total time pH below 4.0:  9.9%.  DeMeester score (DeMeester normals <14.7 95th percentile):  28.8.   IMPRESSION:  A 24-hour ambulatory esophageal pH study performed while on  Nexium 40 mg b.i.d. and Reglan 10 mg q.a.c. and q.h.s.  Markedly abnormal  esophageal pH probe study on acid suppression.  The patient has  a  significant amount of gastroesophageal reflux documented on this study.  Unfortunately she did not record any symptoms on her diary sheet; therefore,  correlation between episodes of reflux and symptoms cannot be determined.  Notably, the patient did not have any significant acid reflux in the supine  position.   RECOMMENDATION:  Will have the patient continue Nexium 40 mg b.i.d. and  Reglan q.a.c. and q.h.s. for now.  We will touch base with her with regards  to any symptoms that she might have had during this 24 hour period of time.  The patient is to follow up with Dr. Jena Gauss regarding further  recommendations.     ________________________________________  ___________________________________________  Tana Coast, P.A.  Jonathon Bellows, M.D.   LL/MEDQ  D:  08/19/2003  T:  08/19/2003  Job:  [id number]   cc:   R. Roetta Sessions, M.D.  [address]  [place]  [place] [address]  Fax: [phone]

## 2010-05-20 NOTE — H&P (Signed)
NAME:  [name] [name]                         ACCOUNT NO.:  [id number]   MEDICAL RECORD NO.:  [id number]                   PATIENT TYPE:  INP   LOCATION:  4705                                 FACILITY:  [facility]   PHYSICIAN:   Bing, M.D.               DATE OF BIRTH:  1960/12/08   DATE OF ADMISSION:  03/13/2003  DATE OF DISCHARGE:  03/13/2003                                HISTORY & PHYSICAL   PHYSICIANS:  Referring physician, Dr. Ilean Skill  Primary care physician, Dr. Doyne Keel.  Primary cardiologist, Dr. Diona Browner.   HISTORY OF PRESENT ILLNESS:  A 50 year old woman transferred from [facility] where she presented with an exacerbation of chronic chest pain.  [name] [name]'s history dates back at least to 1998 when she was admitted to  [facility] for chest discomfort.  Cardiac catheterization at that  time was reportedly entirely normal.  She has had multiple subsequent  admissions and evaluations, most recently in December of last year at  [facility].  Her most recent catheterization was performed in late  2002 at which time she was said to have a 40% proximal RCA lesion that was  smooth and not of the typical appearance of atherosclerosis.  Her most  recent stress Cardiolite study was performed at [facility] within the  past year.  She experienced approximately four episodes of transient chest  discomfort with radiation to the neck and jaw and associated dyspnea today,  each terminated by sublingual nitroglycerin.  She also experienced some  nausea.  This is not unlike her usual chest discomfort.  She subsequently  was seen by her primary care physician for a routine followup visit.  Upon  hearing of her recent symptoms, he advised observation in the hospital.  Since [facility] was excessively busy, he sent the patient to [facility].  There were no unit beds there for management of intravenous  nitroglycerin, prompting transfer  here.   PAST MEDICAL HISTORY:  Extensive and includes:  1. Recurrent episodes of bronchitis.  2. History of oral Candidiasis.  3. Depression and anxiety.  4. Asthma.  5. Fibromyositis.  6. Chronic low back pain.  7. Mediastinal lymphadenopathy of uncertain cause.  8. Pituitary adenoma.  9. GERD.  10.      Chronic anemia.  11.      B12 deficiency.   PAST SURGICAL HISTORY:  1. Appendectomy.  2. Cholecystectomy.  3. Hysterectomy.  4. Lumbar laminectomy.  5. Left arthroscopic knee surgery.   MEDICATIONS:  Also extensive and include:  1. Advair.  2. Albuterol.  3. Aspirin.  4. Baclofen 20 t.i.d.  5. Calcium.  6. Torsemide 40 mg b.i.d.  7. Diazepam 5 mg t.i.d.  8. Diflucan weekly.  9. Duragesic.  10.      Flonase.  11.      Hycodan.  12.      Imdur 120 mg daily.  13.      KCl 40 mEq t.i.d.  14.      MiraLax.  15.      Multivitamins.  16.      Neurontin 400 mg t.i.d.  17.      Nexium.  18.      Niaspan 1 g q.p.m.  19.      Sublingual nitroglycerin.  20.      Nortriptyline 100 mg q.h.s.  21.      Phenergan.  22.      Premarin 1.25 mg daily.  23.      Reglan 10 mg four times a day  24.      Spironolactone 50 mg b.i.d.  25.      Toprol 50 mg daily.  26.      Vitamin B12 monthly.  27.      Wellbutrin 150 mg b.i.d.   ALLERGIES:  No allergies to medications are reported.   SOCIAL HISTORY:  Disabled; lives with relatives; 30-pack-year history of  cigarette smoking, recently discontinued.   FAMILY HISTORY:  Father died from myocardial infarction.   REVIEW OF SYSTEMS:  Limited activity level; chronic low back pain; chronic  lower extremity edema with erythema of the skin; all other systems negative.   PHYSICAL EXAMINATION:  GENERAL:  Pleasant woman with mildly slurred speech  which she attributes to fatigue, in no acute distress.  Morphine was  administered when she was in the emergency department.  VITAL SIGNS:  Blood pressure 105/75, heart rate 85 and regular,  respirations  20.  HEENT:  Pupils equal, round, and reactive to light, anicteric sclerae.  NECK:  No jugular venous distention, no carotid bruit.  ENDOCRINE:  No thyromegaly.  HEMATOPOIETIC:  No adenopathy.  SKIN:  Erythema and warmth over the lower legs.  LUNGS: Decreased breath sounds.  CARDIAC:  Distant first and second heart sounds.  Minimal systolic murmur.  ABDOMEN:  Soft and nontender.  No bruits, nor organomegaly.  EXTREMITIES:  1+ pretibial edema; normal distal pulses.  NEUROMUSCULAR:  Symmetrical strength and tone.  MUSCULOSKELETAL:  No joint deformities.   LABORATORY AND X-RAY DATA:  EKG:  Sinus rhythm; nondiagnostic lateral Q  wave; PVCs.   Initial laboratory studies unremarkable including cardiac markers, CBC, and  chemistry profile.   IMPRESSION:  [name] [name] has chronic chest pain that is probably not of  cardiac origin, although coronary spasm and syndrome X have not been  entirely excluded by previous testing.  It is unlikely that her repeat  stress Cardiolite study or cardiac catheterization will be of benefit.  There is little room for manipulation of her medical therapy.  Serial  cardiac markers and EKGs will be obtained.  If there is no evidence of  myocardial ischemia or infarction, she will be released from the hospital  for followup in [place].                                                [place] Bing, M.D.    RR/MEDQ  D:  03/13/2003  T:  03/14/2003  Job:  [id number]   cc:   Ilean Skill, M.D.   Dr. Leeroy Bock, M.D. [facility]

## 2010-05-20 NOTE — Op Note (Signed)
[name] [name], [name] [name]               ACCOUNT NO.:  [id number]   MEDICAL RECORD NO.:  [id number]          PATIENT TYPE:  AMB   LOCATION:  SDS                          FACILITY:  [facility]   PHYSICIAN:  Ines Bloomer, M.D. DATE OF BIRTH:  1960-08-03   DATE OF PROCEDURE:  05/02/2005  DATE OF DISCHARGE:                                 OPERATIVE REPORT   PREOPERATIVE DIAGNOSIS:  Mediastinal adenopathy.   POSTOPERATIVE DIAGNOSIS:  Mediastinal adenopathy.   OPERATION PERFORMED:  Fiberoptic bronchoscopy, mediastinoscopy.   SURGEON:  Ines Bloomer, M.D.   ANESTHESIA:  General.   After adequate general anesthesia, the video bronchoscope was passed through  the endotracheal tube.  There was a lot of tracheal bronchitis but the  distal carina was normal.  The right upper lobe, right middle lobe, and  right lower lobe orifices were normal.  The left mainstem, left upper lobe,  and left lower lobe orifices were normal.  All of the cultures and washings  were taken.  The video bronchoscope was removed.  The anterior neck was  prepped and draped in the usual sterile manner.  Transverse incision was  made.  It was carried down with electrocautery to the subcutaneous tissue  and fascia.  The pretracheal fascia was entered and __________ was entered.  Biopsies of 4R, 2R, and another 4R node were done.  The strap muscles were  closed with 2-0 Vicryl, subcutaneous sutures with 3-0 Vicryl, and Dermabond  otherwise the skin.  Patient returned to the recovery room in stable  condition.           ______________________________  Ines Bloomer, M.D.     DPB/MEDQ  D:  05/02/2005  T:  05/02/2005  Job:  [id number]   cc:   Kern Reap, M.D.  [place], [place]

## 2010-05-20 NOTE — Discharge Summary (Signed)
NAME:  [name] [name], [name] [name]                         ACCOUNT NO.:  [id number]   MEDICAL RECORD NO.:  [id number]                   PATIENT TYPE:  INP   LOCATION:  4705                                 FACILITY:  [facility]   PHYSICIAN:  Olga Millers, M.D.                DATE OF BIRTH:  October 13, 1960   DATE OF ADMISSION:  03/13/2003  DATE OF DISCHARGE:  03/13/2003                                 DISCHARGE SUMMARY   DISCHARGE DIAGNOSIS:  1. Recurrent chest discomfort, admitted for observation.  Initial cardiac     enzyme negative.  Enzymes on the morning of March 13, 2003, show CK is     53, troponin I is 0.01.  Also electrocardiogram on the morning of March 13, 2003, showing sinus rhythm with occasional premature ventricular     contractions, no ST or T wave changes.  2. History of chronic chest pain status post negative left heart     catheterization in 1998.  3. Left heart catheterization in 2002 with 40% proximal right coronary     artery stenosis.  Multiple Cardiolite studies since then.   SECONDARY DIAGNOSES:  1. Obesity.  2. Asthma.  3. Family history of coronary artery disease.  4. Tobacco habituation.  5. History of lumbosacral degenerative joint disease.  6. Gastroesophageal reflux disease.  7. Status post cholecystectomy, hysterectomy, lumbar fusion surgery, left     knee surgery, and appendectomy.   PROCEDURES:  None.   The studies above including electrocardiography and cardiac enzymes have  been negative.  The patient has ruled out for acute myocardial infarction.  The patient has had no further chest pain or discomfort that could be traced  to unstable angina.  Patient goes home with the following medications:  1. Advair Diskus one puff twice daily.  2. Albuterol inhaler as needed.  3. Enteric-coated aspirin 325 mg daily.  4. Baclofen 20 mg three times a day.  5. Demadex 40 mg twice daily.  6. Diazepam 5 mg three times daily.  7. Duragesic patch 100 mcg per hour, change  every 3 days.  8. Imdur 120 mg daily.  9. Potassium chloride 40 mEq three times daily.  10.      MiraLax 17 g daily.  11.      Neurontin 400 mg three times daily.  12.      Nexium 40 mg daily.  13.      Niaspan 1 g daily.  14.      Nortriptyline 100 mg at bedtime.  15.      Premarin 1.25 mg daily.  16.      Reglan 10 mg before meals and at bedtime.  17.      Aldactone 25 mg daily.  18.      Toprol 50 mg daily.  19.      Sublingual nitroglycerin 0.4 mg as needed.   DISCHARGE ACTIVITIES:  As  tolerated.   DISCHARGE DIET:  Low-sodium, low-cholesterol diet.   FOLLOW UP:  She has follow up scheduled with Dr. Diona Browner at the [facility] on March 24, 2003, at 1:15 p.m.   BRIEF HISTORY:  [name] is a 50 year old female.  She has chronic chest  pain.  She was admitted with an exacerbation.  She had catheterization in  1998, which was negative.  She had left heart catheterization in 2002, which  showed a 40% smooth proximal RCA lesion.  She has had multiple Cardiolites  with variable findings.  She has had transient recurrent chest tightness  since arrival in the emergency room.  She has an extensive medication list.  Electrocardiogram at the emergency room was nondiagnostic with lateral Q-  waves.  Initial cardiac enzyme was negative to rule out myocardial  infarction.  Medications for ischemic coronary disease have been maximized.  It is doubted that further diagnostic study will be of benefit.  Serial  markers will be obtained and if negative in the morning, she can be  discharged, and indeed they were negative.  CK was 53 and troponin I was  0.01.   HOSPITAL COURSE:  She was seen by Dr. Jens Som on the morning of March 13, 2003.  She has had no further chest pain overnight after her admission in  the early morning hours.  She is maintaining a regular rate and rhythm.  Two  sets of cardiac enzymes were negative.  Electrocardiogram shows normal sinus  rhythm  with no ST changes.  She will go home with the pre-admission  medications and she will follow up with Dr. Nona Dell in the [facility]  office on March 24, 2003, as previously arranged.      Maple Mirza, P.A.                    Olga Millers, M.D.    GM/MEDQ  D:  03/13/2003  T:  03/15/2003  Job:  [id number]   cc:   Jonelle Sidle, M.D. [facility]   Wende Crease, M.D.

## 2010-05-20 NOTE — Op Note (Signed)
[name] [name], [name] [name]               ACCOUNT NO.:  [id number]   MEDICAL RECORD NO.:  [id number]          PATIENT TYPE:  AMB   LOCATION:  DAY                           FACILITY:  [facility]   PHYSICIAN:  R. Roetta Sessions, M.D. DATE OF BIRTH:  1960/06/15   DATE OF PROCEDURE:  10/12/2004  DATE OF DISCHARGE:                                 OPERATIVE REPORT   PROCEDURE:  Esophagogastroduodenoscopy with esophageal biopsy, KOH brush.   INDICATIONS FOR PROCEDURE:  A 50 year old morbid obese lady with chronic  gastroesophageal reflux disease symptoms in spite of t.i.d. 40 mg Nexium  capsules. She has been on Reglan. This is being tapered off. She has  intermittent nausea and vomiting. She has a history of candida esophagitis.  EGD is now being done to rule out mucosa process in the upper GI tract  contributing to her symptoms. This approach has been discussed with the  patient at length. Potential risks, benefits, and alternatives have been  reviewed and questions answered. She is agreeable. Please see documentation  in the medical record.   PROCEDURE NOTE:  O2 saturation, blood pressure, pulse, and respirations were  monitored throughout the entire procedure. Conscious sedation with Versed 4  mg IV and Demerol 100 mg IV in divided doses. SB prophylaxis ampicillin 2 g  IV, gentamicin 120 mg IV. Cetacaine spray for topical oropharyngeal  anesthesia.   INSTRUMENT:  Olympus video chip system.   FINDINGS:  Examination of the tubular esophagus revealed a fine plaquing of  the esophageal mucosa with linear streaking on top of the mucosa. Plaques  were not cream colored or raised. Did not look cheesy as seen with typical  candida esophagitis. Esophageal lumen was widely patent through the EG  junction. Otherwise esophageal mucosa appeared normal.   Stomach:  Gastric cavity was empty and insufflated well with air. Thorough  examination of gastric mucosa including retroflexed view of the proximal  stomach and esophagogastric junction demonstrated only a tiny hiatal hernia.  Pylorus patent and easily traversed. Examination of bulb and second portion  revealed no abnormalities.   THERAPEUTIC/DIAGNOSTIC MANEUVERS:  Esophageal mucosa was biopsied for  histologic study and also brushed for KOH prep.   The patient tolerated the procedure well and was reactive to endoscopy.   IMPRESSION:  Fine plaquing on the esophageal mucosa of uncertain  significance, not typical of what is seen with candida esophagitis status  post KOH brushing for KOH prep and biopsy for histology. Rule out candida  esophagitis/eosinophilic esophagitis. Otherwise normal esophagus. Tiny  hiatal hernia. Otherwise, normal stomach, normal D1 and D2.   RECOMMENDATIONS:  1.  Follow up on pending labs.  2.  Further recommendations to follow.      Jonathon Bellows, M.D.  Electronically Signed     RMR/MEDQ  D:  10/12/2004  T:  10/12/2004  Job:  [id number]   cc:   Wende Crease, M.D.  [facility]  [place], [place]

## 2010-05-20 NOTE — Op Note (Signed)
NAME:  [name] [name], [name] [name]                        ACCOUNT NO.:  [id number]   MEDICAL RECORD NO.:  [id number]                    PATIENT TYPE:   LOCATION:                                       FACILITY:   PHYSICIAN:  R. Roetta Sessions, M.D.              DATE OF BIRTH:   DATE OF PROCEDURE:  08/17/2003  DATE OF DISCHARGE:                                 OPERATIVE REPORT   PROCEDURE:  Ambulatory esophageal pH report.   REFERRING PHYSICIAN:  Fayrene Fearing B. [facility], [place], [place].   INDICATIONS:  The patient is a 50 year old morbidly obese lady with atypical  chest pain and symptoms consistent with typical gastroesophageal reflux  disease refractory to Nexium 40 mg orally b.i.d. and Reglan 10 mg a.c. and  h.s.  She is significantly overweight and continues to gain weight.  Therefore, to sore out further how much of her chest pain may be related to  gastroesophageal reflux disease pH probe monitoring is being performed.  She  had a pH study back in 2000 which showed excessive gastroesophageal reflux  disease.  This approach has been discussed with the patient.   PRIOR STUDIES:  Normal esophageal manometry and abnormal esophageal pH probe  study 04/14/1998.   METHOD:  The pH electrodes were placed 20 and 5 cm above the proximal border  of the manometrically determined lower esophageal sphincter (LES). These  electrodes were defined as channels 1 and 2, respectively. The data was  converted using the [employer], version 2.30. A reflux  episode was defined as a drop in pH below 4.0. The procedure was reviewed  with the patient prior to performing it.   FINDINGS:  Proximal border of the LES (location from nares):  38 cm based on  04/14/1998 EM study.  Study duration:  Channel 2 analysis  Total time pH below 4.0 (min):  142.  Fraction of total time pH below 4.0:  9.9%.  DeMeester score (DeMeester normals <14.7 95th percentile):  This study  28.8.   IMPRESSION:  In reviewing the patient's diary during the study she did not  have any episodes of chest pain or reflux during the study.   This study is felt to be abnormal.  She does have objectively documented  abnormal periods of gastroesophageal reflux.  This is striking in the  setting of concomitant high-dose proton pump inhibitor therapy and  __________ therapy in the way of Reglan.  This is an abnormal study.   RECOMMENDATION:  Will continue to recommend weight loss and an antireflux--  lifestyle diet.  Follow up appointment with [place] in our office in 6 weeks.   The concept of antireflux surgery was discussed with this lady previously;  however, her marked morbid obesity would make that approach risky.  The  ideal situation is for this lady to loose 100 pounds or some lesser  significant amount of weight which would  be of great benefit for her overall  health as well as would have a positive effect on her gastroesophageal  reflux disease.      ___________________________________________                                            Jonathon Bellows, M.D.   RMR/MEDQ  D:  08/26/2003  T:  08/27/2003  Job:  [id number]   cc:   Fayrene Fearing B. [facility].  [name]  [place]   R. Roetta Sessions, M.D.  [address]  [place]  [place] [address]  Fax: [phone]

## 2010-05-23 ENCOUNTER — Encounter (HOSPITAL_BASED_OUTPATIENT_CLINIC_OR_DEPARTMENT_OTHER)
Admission: RE | Admit: 2010-05-23 | Discharge: 2010-05-23 | Disposition: A | Discharge status: Home / Self Care | Payer: Medicare Other | Source: Ambulatory Visit | Attending: Orthopedic Surgery | Admitting: Orthopedic Surgery

## 2010-05-23 LAB — BASIC METABOLIC PANEL
CO2: 28 mEq/L (ref 19–32)
Calcium: 10.1 mg/dL (ref 8.4–10.5)
Creatinine, Ser: 0.64 mg/dL (ref 0.4–1.2)
GFR calc non Af Amer: >60 mL/min (ref >60)
Glucose, Bld: 125 mg/dL — ABNORMAL HIGH (ref 70–99)
Sodium: 138 mEq/L (ref 135–145)

## 2010-05-24 ENCOUNTER — Ambulatory Visit (HOSPITAL_BASED_OUTPATIENT_CLINIC_OR_DEPARTMENT_OTHER)
Admission: RE | Admit: 2010-05-24 | Discharge: 2010-05-24 | Disposition: A | Discharge status: Home / Self Care | Payer: Medicare Other | Source: Ambulatory Visit | Attending: Orthopedic Surgery | Admitting: Orthopedic Surgery

## 2010-05-24 DIAGNOSIS — G5620 Lesion of ulnar nerve, unspecified upper limb: Secondary | ICD-10-CM | POA: Insufficient documentation

## 2010-05-24 DIAGNOSIS — M479 Spondylosis, unspecified: Secondary | ICD-10-CM | POA: Insufficient documentation

## 2010-05-24 DIAGNOSIS — Z01810 Encounter for preprocedural cardiovascular examination: Secondary | ICD-10-CM | POA: Insufficient documentation

## 2010-05-24 DIAGNOSIS — J449 Chronic obstructive pulmonary disease, unspecified: Secondary | ICD-10-CM | POA: Insufficient documentation

## 2010-05-24 DIAGNOSIS — J4489 Other specified chronic obstructive pulmonary disease: Secondary | ICD-10-CM | POA: Insufficient documentation

## 2010-05-24 DIAGNOSIS — Z01812 Encounter for preprocedural laboratory examination: Secondary | ICD-10-CM | POA: Insufficient documentation

## 2010-05-24 DIAGNOSIS — E119 Type 2 diabetes mellitus without complications: Secondary | ICD-10-CM | POA: Insufficient documentation

## 2010-05-24 DIAGNOSIS — I2510 Atherosclerotic heart disease of native coronary artery without angina pectoris: Secondary | ICD-10-CM | POA: Insufficient documentation

## 2010-05-24 DIAGNOSIS — E669 Obesity, unspecified: Secondary | ICD-10-CM | POA: Insufficient documentation

## 2010-05-24 DIAGNOSIS — I10 Essential (primary) hypertension: Secondary | ICD-10-CM | POA: Insufficient documentation

## 2010-05-24 DIAGNOSIS — M6740 Ganglion, unspecified site: Secondary | ICD-10-CM | POA: Insufficient documentation

## 2010-05-24 DIAGNOSIS — M25439 Effusion, unspecified wrist: Secondary | ICD-10-CM | POA: Insufficient documentation

## 2010-05-27 NOTE — Op Note (Signed)
[name], [name]               ACCOUNT NO.:  [id number]  MEDICAL RECORD NO.:  [id number]           PATIENT TYPE:  LOCATION:                                 FACILITY:  PHYSICIAN:  Katy Fitch. Oleta Gunnoe, M.D. 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  OPERATION: 1. Decompression of left ulnar nerve at Guyon canal with inspection of     pisotriquetral joint and identification of mild effusion but no     evidence of an entrapping pisotriquetral ganglion. 2. Resection of complex dorsal myxoid cyst involving fourth dorsal  compartment and radiocarpal articulation followed by resection of     fourth dorsal compartment ganglion and debridement of radiocarpal     articulation with resection of posterior interosseous nerve for     postoperative pain control.  SURGEON:  Katy Fitch. Koehn Salehi, MD  ASSISTANT:  Annye Rusk, PA-C  ANESTHESIA:  General by LMA supplemented by a left interscalene block.  SUPERVISING ANESTHESIOLOGIST:  Quita Skye. Krista Blue, MD  INDICATIONS:  [name] [name] is a 50 year old woman referred through the courtesy of Dr. Wende Crease of [place], [place] for evaluation and management of left hand numbness and a large myxoid cyst on the dorsal aspect of the wrist.  [name] [name] has recently been diagnosed to have type 2 diabetes.  She has a large body mass index of 41.  Multiple comorbidities including COPD, hypertension, coronary artery disease and marked functional impairment due to spinal arthrosis.  Her clinical examination suggested ulnar entrapment neuropathy either at the elbow or wrist.  Clinical examination confirmed the presence of moderate pisotriquetral arthrosis evidenced by crepitation with grind and a large dorsal myxoid cyst.  Due to electrodiagnostic studies being normal at the elbow and borderline at the wrist, we obtained an MRI of the wrist, looking for signs of a ganglion at Guyon canal.  Given the past history of  pisotriquetral arthrosis, this was a distinct possibility.  [name] [name] was noted to have a bilobed cyst dorsally involving the fourth dorsal compartment and extending within the joint with  a multilobular component within the joint likely causing compression of the posterior interosseous nerve.  She has also noted to have a cyst proximal and distal to pisotriquetral joint that could have been a synovial cyst due to arthrosis and may have been causing compression of the ulnar nerve to wrist.  Due to persistent ulnar symptoms in view of a normal conduction  study at the elbow, we recommended proceeding with exploration of the ulnar nerve at canal Guyon anticipating possible cyst excision from the pisotriquetral joint.  Also, we planned on dorsal exploration in removal of her fourth dorsal compartment cyst and her intra-articular components of her myxoid cyst.  Preoperatively questions were invited and answered in detail.  She was reviewed by Dr. Krista Blue preoperatively for an Anesthesia consult. He recommended a regional block supplemented by general anesthesia by LMA technique.  [name] [name] accepted Dr. Robina Ade recommendations and is brought to the operating room at this time.  PROCEDURE:  [name] [name] was brought to room 8 at the [facility] surgical center and placed in supine position on the operating table.  Following Dr. Robina Ade consultation, a left infraclavicular block was placed without complication after few moments of excellent anesthesia of the left upper extremity was achieved.  [name] [name] was brought to room 8 where under Dr. Robina Ade direct supervision general anesthesia by LMA technique was induced.  The left arm was then prepped with Betadine soap solution and sterilely draped.  Due to joint entry, 1 g of Ancef was administered as an IV prophylactic antibiotic.  A preoperative time-out was accomplished followed by planning of a Brunner zigzag incision at the distal wrist flexion creases extending over the path of the ulnar nerve through the canal Guyon.  The arm was exsanguinated with Esmarch bandage and an arterial tourniquet on the proximal brachium was inflated to 250 mmHg.  Procedure commenced with incision in the region between the proximal and distal wrist flexion creases identifying the flexor carpi ulnaris, the ulnar nerve and ulnar artery at wrist level.  A meticulous dissection through the canal Guyon was accomplished following the path of the ulnar nerve. The motor branch was identified at its entry point at  the abductor digiti minimi.  The motor branch to the hypothenar muscles identified and the distal sensory branches were followed through the palmar space. The pisotriquetral joint was directly inspected after retraction of the artery and nerve.  There was no ganglion directly compressing the nerve. The pisotriquetral ligament and the fascia at the wrist level was quite contracted due to her diabetes as well as morbid obesity.  There was clearly evidence of compression of the nerve at the distal wrist flexion crease and at the pisotriquetral ligament.  We elected not to resect the pisiform.  This wound was then dressed with hemostasis with bipolar cautery under saline.  The wound was repaired with subcutaneous 4-0 Vicryl and intradermal 3-0 Prolene with Steri-Strips.  Attention then directed to the dorsal aspect of the wrist.  A 3-cm dorsal transverse incision was fashioned directly over the apex of her cyst.  Subcutaneous tissues were carefully divided taking care to spare the dorsal cutaneous sensory branches and dorsal veins.  The retinacular fibers were split transversely and the cystic exposed.  There was a cyst measuring 2 cm diameter in the fourth dorsal compartment which was carefully followed to its entry into the capsule.  Fourth dorsal compartment tendons were retracted ulnarly, the second dorsal compartment tendons retracted radially.  We drained the cyst and followed its  neck into the radiocarpal articulation through transverse arthrotomy.  The cyst was exiting between the posterior interosseous nerve branches and the dorsal posterior interosseous vascular branches. After dissection, I elected to resect the posterior interosseous nerve branch that was directly involving the cyst to prevent postoperative pain.  The cyst was followed directly to the scapholunate ligament and had another lobe that extended towards a radiolunate articulation.  A capsulectomy was performed  eliminating all elements of the cyst.  Bleeding points were electrocauterized with bipolar current followed by repair of the wound in layers with 4-0 Vicryl subcutaneously and intradermal 3-0 Prolene with Steri-Strips.  [name] [name] was placed in compressive dressing.  She will be discharged home to the care of friends and family.  She was provided a prescription for Percocet 5 mg one p.o. q. 4-6 h. p.r.n. pain 24 tablets without refill.  We will see her back for follow-up in the office in 1 week for dressing change, discussion of pathology and planning of her postoperative rehabilitation program.     Katy Fitch. Dossie Swor, M.D.     RVS/MEDQ  D:  05/24/2010  T:  05/24/2010  Job:  [id number]  cc:   Dr. Wende Crease  Electronically Signed by Josephine Igo M.D. on 05/27/2010 08:15:05 AM

## 2010-10-03 LAB — BASIC METABOLIC PANEL
BUN: 7
CO2: 27
Calcium: 8.4
Chloride: 95 — ABNORMAL LOW
GFR calc non Af Amer: >60
Glucose, Bld: 129 — ABNORMAL HIGH
Glucose, Bld: 149 — ABNORMAL HIGH
Potassium: 3.5
Sodium: 135
Sodium: 136

## 2010-10-03 LAB — CBC
HCT: 42.4
Hemoglobin: 14.5
Hemoglobin: 11.4 — ABNORMAL LOW
MCHC: 34.3
MCV: 92.2
Platelets: 341
Platelets: 261
RDW: 12.9
RDW: 13.4
WBC: 12.4 — ABNORMAL HIGH

## 2010-10-03 LAB — TYPE AND SCREEN

## 2010-10-03 LAB — POCT I-STAT 4, (NA,K, GLUC, HGB,HCT)
Glucose, Bld: 177 — ABNORMAL HIGH
HCT: 34 — ABNORMAL LOW
Hemoglobin: 11.6 — ABNORMAL LOW

## 2010-11-08 ENCOUNTER — Encounter (HOSPITAL_COMMUNITY): Payer: Self-pay

## 2010-11-15 ENCOUNTER — Ambulatory Visit (HOSPITAL_COMMUNITY)
Admission: RE | Admit: 2010-11-15 | Discharge: 2010-11-15 | Disposition: A | Discharge status: Home / Self Care | Payer: Medicare Other | Source: Ambulatory Visit | Attending: Cardiology | Admitting: Cardiology

## 2010-11-15 ENCOUNTER — Encounter (HOSPITAL_COMMUNITY): Payer: Self-pay

## 2010-11-15 ENCOUNTER — Encounter (HOSPITAL_COMMUNITY)
Admission: RE | Disposition: A | Discharge status: Home / Self Care | Payer: Self-pay | Source: Ambulatory Visit | Attending: Cardiology

## 2010-11-15 DIAGNOSIS — E119 Type 2 diabetes mellitus without complications: Secondary | ICD-10-CM | POA: Insufficient documentation

## 2010-11-15 DIAGNOSIS — R079 Chest pain, unspecified: Secondary | ICD-10-CM | POA: Insufficient documentation

## 2010-11-15 DIAGNOSIS — I10 Essential (primary) hypertension: Secondary | ICD-10-CM | POA: Insufficient documentation

## 2010-11-15 DIAGNOSIS — Z87891 Personal history of nicotine dependence: Secondary | ICD-10-CM | POA: Insufficient documentation

## 2010-11-15 HISTORY — PX: LEFT HEART CATHETERIZATION WITH CORONARY ANGIOGRAM: SHX5451

## 2010-11-15 SURGERY — LEFT HEART CATHETERIZATION WITH CORONARY ANGIOGRAM
Anesthesia: LOCAL

## 2010-11-15 MED ORDER — FENTANYL CITRATE 0.05 MG/ML IJ SOLN
INTRAMUSCULAR | Status: AC
  Filled 2010-11-15: qty 2

## 2010-11-15 MED ORDER — ACETAMINOPHEN 325 MG PO TABS
650.0000 mg | ORAL_TABLET | Freq: Four times a day (QID) | ORAL | Status: DC | PRN
  Filled 2010-11-15: qty 2

## 2010-11-15 MED ORDER — ASPIRIN 81 MG PO CHEW
324.0000 mg | CHEWABLE_TABLET | ORAL | Status: AC
  Administered 2010-11-15: 324 mg via ORAL

## 2010-11-15 MED ORDER — NITROGLYCERIN 0.2 MG/ML ON CALL CATH LAB
INTRAVENOUS | Status: AC
  Filled 2010-11-15: qty 1

## 2010-11-15 MED ORDER — ACETAMINOPHEN 325 MG PO TABS: 650.0000 mg | ORAL_TABLET | ORAL | Status: DC | PRN

## 2010-11-15 MED ORDER — IOHEXOL 300 MG/ML  SOLN: 10.0000 mL | Freq: Once | INTRAMUSCULAR | Status: DC | PRN

## 2010-11-15 MED ORDER — SODIUM CHLORIDE 0.9 % IJ SOLN
3.0000 mL | Freq: Two times a day (BID) | INTRAMUSCULAR | Status: DC
Start: 2010-11-15 — End: 2010-11-15

## 2010-11-15 MED ORDER — ONDANSETRON HCL 4 MG/2ML IJ SOLN: 4.0000 mg | Freq: Four times a day (QID) | INTRAMUSCULAR | Status: DC | PRN

## 2010-11-15 MED ORDER — HEPARIN SODIUM (PORCINE) 1000 UNIT/ML IJ SOLN
INTRAMUSCULAR | Status: AC
  Filled 2010-11-15: qty 1

## 2010-11-15 MED ORDER — MIDAZOLAM HCL 2 MG/2ML IJ SOLN
INTRAMUSCULAR | Status: AC
  Filled 2010-11-15: qty 2

## 2010-11-15 MED ORDER — VERAPAMIL HCL 2.5 MG/ML IV SOLN
INTRAVENOUS | Status: AC
  Filled 2010-11-15: qty 2

## 2010-11-15 MED ORDER — SODIUM CHLORIDE 0.9 % IJ SOLN: 3.0000 mL | INTRAMUSCULAR | Status: DC | PRN

## 2010-11-15 MED ORDER — SODIUM CHLORIDE 0.9 % IV SOLN: 1.0000 mL/kg/h | INTRAVENOUS | Status: DC

## 2010-11-15 MED ORDER — HEPARIN (PORCINE) IN NACL 2-0.9 UNIT/ML-% IJ SOLN
INTRAMUSCULAR | Status: AC
  Filled 2010-11-15: qty 2000

## 2010-11-15 MED ORDER — FENTANYL CITRATE 0.05 MG/ML IJ SOLN
50.0000 ug | INTRAMUSCULAR | Status: DC | PRN
  Administered 2010-11-15: 11:00:00 via INTRAVENOUS
  Filled 2010-11-15: qty 2

## 2010-11-15 MED ORDER — ONDANSETRON HCL 4 MG/2ML IJ SOLN
INTRAMUSCULAR | Status: AC
  Filled 2010-11-15: qty 2

## 2010-11-15 MED ORDER — LIDOCAINE HCL (PF) 1 % IJ SOLN
INTRAMUSCULAR | Status: AC
  Filled 2010-11-15: qty 30

## 2010-11-15 MED ORDER — SODIUM CHLORIDE 0.9 % IV SOLN
250.0000 mL | INTRAVENOUS | Status: DC
  Administered 2010-11-15: 1000 mL via INTRAVENOUS

## 2010-11-15 MED ORDER — ASPIRIN 81 MG PO CHEW
CHEWABLE_TABLET | ORAL | Status: AC
  Administered 2010-11-15: 324 mg via ORAL
  Filled 2010-11-15: qty 4

## 2010-11-15 NOTE — Cardiovascular Report (Signed)
NAMEKRISTOPHER, [name]               ACCOUNT NO.:  [id number]  MEDICAL RECORD NO.:  [id number]  LOCATION:  [facility]                         FACILITY:  [facility]  PHYSICIAN:  Pamella Pert, MD DATE OF BIRTH:  05-Nov-1960  DATE OF PROCEDURE: DATE OF DISCHARGE:                           CARDIAC CATHETERIZATION   PROCEDURE PERFORMED: 1. Cardiac catheterization, left heart catheterization including left     ventriculography. 2. Selective right and left coronary arteriography.  INDICATION:  [name] is a 50 year old pleasant female with history of diabetes, hypertension, prior tobacco use who has been having recurrent chest discomfort.  She has had negative Cardiolite stress test x2 in the past, last one was in 2010.  Because of continued recurrent chest pain for definite diagnosis of coronary artery disease, she is now brought to the cardiac cath lab to evaluate his coronary anatomy.  She has had cardiac catheterization in 2002, which had revealed normal coronary arteries.  HEMODYNAMIC DATA:  Left ventricular pressure was 109/8 with end diastole of 16 mmHg.  Aortic pressure was 103/62 with a mean of 82 mmHg.  There is no pressure gradient across the aortic valve.  ANGIOGRAPHIC DATA:  Left ventricle:  Left ventricular systolic function was normal with ejection fraction of 60% to 65%.  Right coronary artery:  Right coronary artery is a large-caliber vessel and is a dominant vessel.  It is smooth and normal.  Left main coronary artery:  Left main coronary artery is a large-caliber vessel.  It is smooth and normal.  Left anterior descending artery.  Left anterior descending artery is a large-caliber vessel, which gives origin to several small to moderate- sized diagonals.  It wraps around the apex.  It is smooth and normal.  Circumflex:  The circumflex is a very large vessel giving origin to large obtuse marginal 1.  It is smooth and normal.  IMPRESSION: 1. Normal left ventricular  systolic function.  Ejection fraction 65%.     No significant mitral regurgitation. 2. Mildly elevated left ventricular end-diastolic pressure suggestive     of diastolic dysfunction. 3. Normal coronary arteries, right dominant circulation.  RECOMMENDATION:  Evaluation for noncardiac cause of chest pain is indicated.  I suspect GI as an etiology for her chest pain.  She will be discharged home today with outpatient followup.  A total of 60 mL of contrast was utilized for diagnostic angiography.  TECHNIQUE OF PROCEDURE:  Under sterile precautions, using 6-French right radial access, a 5-French TIG #4 catheter was advanced into the ascending aorta and selective right and left coronary arteriography was performed. Catheter was then pulled out of the body over a exchange length J-wire. Left ventriculography was performed in the RAO projection using a 5- French pigtail catheter.  The catheter exchanges was done over a J-wire. Hemostasis was obtained by applying TR band.  The patient tolerated the procedure well.  No immediate complications.     Pamella Pert, MD     JRG/MEDQ  D:  11/15/2010  T:  11/15/2010  Job:  [id number]

## 2010-11-15 NOTE — H&P (Signed)
  Date of Initial H&P: 11/07/2010  History reviewed, patient examined, no change in status, stable for surgery. No change since prior out patient evaluation in physical exam and medication reconciliation.  Pamella Pert 11/15/2010 9:49 AM

## 2010-11-15 NOTE — Brief Op Note (Signed)
11/15/2010  10:26 AM  PATIENT: [name] [name]  PRE-OPERATIVE DIAGNOSIS: Chest pain   POST-OPERATIVE DIAGNOSIS: Same  PROCEDURE (S):  leftHEART CATHETERIZATION WITH CORONARY/ ANGIOGRAM  Cardiologist: Jeanella Cara, MD, [facility].:    Referring MD: PCP      DICTATION: .Other Dictation: Dictation Number [id number]   PATIENT DISPOSITION:  Short Stay

## 2011-01-12 DIAGNOSIS — R339 Retention of urine, unspecified: Secondary | ICD-10-CM | POA: Insufficient documentation

## 2011-01-12 DIAGNOSIS — N941 Unspecified dyspareunia: Secondary | ICD-10-CM | POA: Insufficient documentation

## 2011-01-12 DIAGNOSIS — R3589 Other polyuria: Secondary | ICD-10-CM | POA: Insufficient documentation

## 2011-07-13 DIAGNOSIS — R319 Hematuria, unspecified: Secondary | ICD-10-CM | POA: Insufficient documentation

## 2011-08-04 DIAGNOSIS — D72829 Elevated white blood cell count, unspecified: Secondary | ICD-10-CM | POA: Insufficient documentation

## 2011-08-04 DIAGNOSIS — R590 Localized enlarged lymph nodes: Secondary | ICD-10-CM | POA: Insufficient documentation

## 2011-08-17 DIAGNOSIS — R591 Generalized enlarged lymph nodes: Secondary | ICD-10-CM | POA: Insufficient documentation

## 2011-09-13 DIAGNOSIS — I4891 Unspecified atrial fibrillation: Secondary | ICD-10-CM

## 2011-09-13 DIAGNOSIS — R079 Chest pain, unspecified: Secondary | ICD-10-CM

## 2011-09-15 ENCOUNTER — Telehealth: Payer: Self-pay | Admitting: Cardiology

## 2011-09-15 NOTE — Telephone Encounter (Signed)
Spoke with patient and she said she was recently d/c from [facility] and found to have atrial fibrillation and placed on cardizem cd 120 mg and xarelto 20 mg. Patient said her HR speeds up especially when she moves around. Patient is unable to check BP and HR because she doesn't have a monitor at home. Spoke with Gene and he suggest patient go to ED for evaluation.

## 2011-09-15 NOTE — Telephone Encounter (Signed)
[name] [name] called and states that while taking her shower yesterday she became very short of breath. States that she Is concerned about her shortness of breath. Has appointment with Dr. Diona Browner on 09-28-2011.

## 2011-09-28 ENCOUNTER — Encounter: Payer: Self-pay | Admitting: Cardiology

## 2011-09-28 ENCOUNTER — Encounter: Payer: Self-pay | Admitting: *Deleted

## 2011-09-28 ENCOUNTER — Ambulatory Visit (INDEPENDENT_AMBULATORY_CARE_PROVIDER_SITE_OTHER): Payer: Medicare Other | Admitting: Cardiology

## 2011-09-28 VITALS — BP 108/62 | HR 91 | Ht 68.0 in | Wt 294.0 lb

## 2011-09-28 DIAGNOSIS — I480 Paroxysmal atrial fibrillation: Secondary | ICD-10-CM | POA: Insufficient documentation

## 2011-09-28 DIAGNOSIS — I4891 Unspecified atrial fibrillation: Secondary | ICD-10-CM

## 2011-09-28 DIAGNOSIS — I10 Essential (primary) hypertension: Secondary | ICD-10-CM

## 2011-09-28 NOTE — Patient Instructions (Addendum)
Your physician recommends that you schedule a follow-up appointment in: 5 weeks with Dr. Diona Browner. Your physician recommends that you continue on your current medications as directed. Please refer to the Current Medication list given to you today.  Your physician has recommended that you have a Cardioversion (DCCV) October 10th 2013 @10 :00am with Dr. Diona Browner. Electrical Cardioversion uses a jolt of electricity to your heart either through paddles or wired patches attached to your chest. This is a controlled, usually prescheduled, procedure. Defibrillation is done under light anesthesia in the hospital, and you usually go home the day of the procedure. This is done to get your heart back into a normal rhythm. You are not awake for the procedure. Please see the instruction sheet given to you today.

## 2011-09-28 NOTE — Progress Notes (Signed)
Clinical Summary [name] [name] is a 51 y.o.female presenting for office followup. I met her recently in consultation at [facility], previously followed by Dr. Jacinto Halim in [place]. She has been recently diagnosed with paroxysmal to persistent atrial fibrillation, managed medically including rate control and anticoagulant therapy.  Recent echocardiogram on 9/11 showed mild LVH with LV EF 60-65%, mild left atrial enlargement, trace mitral and tricuspid regurgitation, RVSP 33 mm mercury. I reviewed this with her today.  She still reports dyspnea on exertion, vague sense of palpitations, sometimes sweating. Her ECG today shows persistent atrial fibrillation, although heart rate control is much better at 80 beats per minute.  She states that she does have sleep apnea, although has not been able to tolerate CPAP. She is followed by Dr. Egbert Garibaldi for Pulmonary medicine assessment in [place].  We discussed options for management of atrial fibrillation including cardioversion, possibility of antiarrhythmic therapy, also need for better control of sleep apnea. Ablation would be reserved for later.  She has been on Xarelto for 2 weeks, denies any bleeding episodes.   Allergies  Allergen Reactions  . Hyoscyamine Sulfate Other (See Comments)    TACHYCARDIA  . Metoclopramide Hcl Other (See Comments)     TREMORS    Current Outpatient Prescriptions  Medication Sig Dispense Refill  . albuterol (PROAIR HFA) 108 (90 BASE) MCG/ACT inhaler Inhale 2 puffs into the lungs every 6 (six) hours as needed.      Marland Kitchen albuterol (PROVENTIL) (2.5 MG/3ML) 0.083% nebulizer solution Take 2.5 mg by nebulization every 6 (six) hours as needed. For shortness of breath.       Marland Kitchen amoxicillin (AMOXIL) 500 MG capsule Take 2,000 mg by mouth as directed.      . baclofen (LIORESAL) 20 MG tablet Take 20 mg by mouth 3 (three) times daily.        Marland Kitchen buPROPion (WELLBUTRIN SR) 150 MG 12 hr tablet Take 150 mg by mouth daily.        . Cyanocobalamin  (VITAMIN B-12 IJ) Inject 1,000 mcg as directed every 30 (thirty) days.       . diazepam (VALIUM) 5 MG tablet Take 5 mg by mouth every 8 (eight) hours as needed. For anxiety.       . diclofenac sodium (VOLTAREN) 1 % GEL Apply 2-4 g topically 4 (four) times daily.      Marland Kitchen diltiazem (CARDIZEM CD) 120 MG 24 hr capsule Take 120 mg by mouth daily.      Marland Kitchen esomeprazole (NEXIUM) 40 MG capsule Take 40 mg by mouth daily before breakfast.      . furosemide (LASIX) 80 MG tablet Take 80 mg by mouth 2 (two) times daily.        Marland Kitchen gabapentin (NEURONTIN) 400 MG capsule Take 400 mg by mouth 4 (four) times daily.        . isosorbide mononitrate (IMDUR) 120 MG 24 hr tablet Take 120 mg by mouth daily.        . isosorbide mononitrate (IMDUR) 30 MG 24 hr tablet Take 30 mg by mouth daily.        Marland Kitchen l-methylfolate-B6-B12 (METANX) 3-35-2 MG TABS Take 1 tablet by mouth daily.        Marland Kitchen lactulose (CHRONULAC) 10 GM/15ML solution Take 15 mLs by mouth 2 (two) times daily as needed.      . loratadine (CLARITIN) 10 MG tablet Take 10 mg by mouth daily.        . niacin (NIASPAN) 500 MG CR tablet Take  500 mg by mouth at bedtime.        . nitroGLYCERIN (NITROLINGUAL) 0.4 MG/SPRAY spray Place 1-2 sprays under the tongue every 5 (five) minutes as needed. For chest pain.       Marland Kitchen oxyCODONE (OXYCONTIN) 40 MG 12 hr tablet Take 40 mg by mouth every 12 (twelve) hours.        Marland Kitchen oxyCODONE-acetaminophen (PERCOCET) 10-325 MG per tablet Take 1 tablet by mouth every 6 (six) hours as needed. For pain.      . potassium chloride SA (K-DUR,KLOR-CON) 20 MEQ tablet Take 40 mEq by mouth 4 (four) times daily.        . Rivaroxaban (XARELTO) 20 MG TABS Take 20 mg by mouth daily.      Marland Kitchen spironolactone (ALDACTONE) 50 MG tablet Take 50 mg by mouth 3 (three) times daily.        Marland Kitchen DISCONTD: isosorbide mononitrate (IMDUR) 30 MG 24 hr tablet Take 30 mg by mouth daily.      Marland Kitchen DISCONTD: albuterol (PROVENTIL HFA;VENTOLIN HFA) 108 (90 BASE) MCG/ACT inhaler Inhale 2 puffs  into the lungs every 6 (six) hours as needed. For shortness of breath.         Past Medical History  Diagnosis Date  . Atrial fibrillation     Paroxysmal  . Essential hypertension, benign   . Type 2 diabetes mellitus   . History of cardiac catheterization     Normal coronary arteries 11/12  . Esophageal spasm     NTG and Norvasc  . Fibromyalgia   . Irritable bowel syndrome   . COPD (chronic obstructive pulmonary disease)   . Lymphedema     No past surgical history on file.  Family History  Problem Relation Age of Onset  . Coronary artery disease Father     Died age 90  . Heart attack Father   . Arrhythmia Father     AF  . Arrhythmia Mother     AF  . Arrhythmia Brother     AF    Social History [name] [name] reports that she quit smoking about 4 years ago. Her smoking use included Cigarettes. She has a 45 pack-year smoking history. She has never used smokeless tobacco. [name] [name] reports that she does not drink alcohol.  Review of Systems Negative except as outlined above.  Physical Examination Filed Vitals:   09/28/11 1108  BP: 108/62  Pulse: 91   Filed Weights   09/28/11 1108  Weight: 294 lb (133.358 kg)   Patient in no acute distress. HEENT: Conjunctiva and lids normal, oropharynx clear with moist mucosa. Neck: Supple, no elevated JVP or carotid bruits, no thyromegaly. Lungs: Clear to auscultation, nonlabored breathing at rest. Cardiac: Irregularly irregular, no S3 or significant systolic murmur, no pericardial rub. Abdomen: Soft, nontender, bowel sounds present, no guarding or rebound. Extremities: Mild edema, distal pulses 2+. Skin: Warm and dry. Musculoskeletal: No kyphosis. Neuropsychiatric: Alert and oriented x3, affect grossly appropriate.   Problem List and Plan   Atrial fibrillation Paroxysmal to persistent, now with better heart rate control on Cardizem CD, tolerating Xarelto without bleeding episodes. As noted above, we discussed options for  management, and our plan will be to arrange elective cardioversion attempt in approximately 2 weeks. We may ultimately need to consider antiarrhythmic therapy as well. I have also asked her to see her pulmonologist and try to arrange more consistent treatment of obstructive sleep apnea as this will be necessary for optimal rhythm management. Office followup arranged.  Essential hypertension, benign Blood pressure well controlled today.  OBSTRUCTIVE SLEEP APNEA I asked her to follow up with Dr. Egbert Garibaldi for pulmonary assessment.    Jonelle Sidle, M.D., F.A.C.C.

## 2011-09-28 NOTE — Assessment & Plan Note (Signed)
Blood pressure well-controlled today. 

## 2011-09-28 NOTE — Assessment & Plan Note (Signed)
Paroxysmal to persistent, now with better heart rate control on Cardizem CD, tolerating Xarelto without bleeding episodes. As noted above, we discussed options for management, and our plan will be to arrange elective cardioversion attempt in approximately 2 weeks. We may ultimately need to consider antiarrhythmic therapy as well. I have also asked her to see her pulmonologist and try to arrange more consistent treatment of obstructive sleep apnea as this will be necessary for optimal rhythm management. Office followup arranged.

## 2011-09-28 NOTE — Assessment & Plan Note (Signed)
I asked her to follow up with Dr. Egbert Garibaldi for pulmonary assessment.

## 2011-10-03 ENCOUNTER — Telehealth: Payer: Self-pay | Admitting: Cardiology

## 2011-10-03 NOTE — Telephone Encounter (Signed)
Patient c/o chest pain this morning that was sharp pressure in middle of chest that went into her back. Patient took nitro times 3 and said it was the worse pain she has ever had. Patient say's it resolved after taking 3rd dose. Patient c/o dizziness and sob with chest pain. Nurse advised patient that she needed to go to ED for evaluation of the severe chest pain she had today. Patient verbalized understanding of plan.

## 2011-10-03 NOTE — Telephone Encounter (Signed)
Cardioversion (DCCV) October 10th 2013 @10 :00am scheduled. [name] [name] is wanting to know if procedure is only scheduled At [facility]. She states that she has been having some chest pains today also. States that it has been severe at times.

## 2011-10-05 DIAGNOSIS — R079 Chest pain, unspecified: Secondary | ICD-10-CM

## 2011-10-11 ENCOUNTER — Other Ambulatory Visit: Payer: Self-pay | Admitting: *Deleted

## 2011-10-11 DIAGNOSIS — I4891 Unspecified atrial fibrillation: Secondary | ICD-10-CM

## 2011-10-12 DIAGNOSIS — I4891 Unspecified atrial fibrillation: Secondary | ICD-10-CM

## 2011-10-30 ENCOUNTER — Ambulatory Visit (INDEPENDENT_AMBULATORY_CARE_PROVIDER_SITE_OTHER): Payer: Medicare Other | Admitting: Cardiology

## 2011-10-30 ENCOUNTER — Encounter: Payer: Self-pay | Admitting: Cardiology

## 2011-10-30 VITALS — BP 110/70 | HR 67 | Ht 68.0 in | Wt 289.0 lb

## 2011-10-30 DIAGNOSIS — I4891 Unspecified atrial fibrillation: Secondary | ICD-10-CM

## 2011-10-30 DIAGNOSIS — G4733 Obstructive sleep apnea (adult) (pediatric): Secondary | ICD-10-CM

## 2011-10-30 DIAGNOSIS — I10 Essential (primary) hypertension: Secondary | ICD-10-CM

## 2011-10-30 NOTE — Patient Instructions (Addendum)
Your physician recommends that you schedule a follow-up appointment in: 3 months. Your physician recommends that you continue on your current medications as directed. Please refer to the Current Medication list given to you today. 

## 2011-10-30 NOTE — Assessment & Plan Note (Signed)
Pending BiPAP titration study.

## 2011-10-30 NOTE — Assessment & Plan Note (Signed)
Maintains sinus rhythm following elective DCCV. Continue current regimen and followup arranged.

## 2011-10-30 NOTE — Assessment & Plan Note (Signed)
Blood pressure normal today. 

## 2011-10-30 NOTE — Progress Notes (Signed)
Clinical Summary Ms. Purcell is a 51 y.o.female presenting for followup. She was seen in September and subsequently underwent successful elective DCCV with conversion of atrial fibrillation to sinus rhythm. She reports feeling better. Still has intermittent "spasms" in her chest, for which she uses nitroglycerin as before. She reports compliance with her medications, no major bleeding episodes on anticoagulation. She also states that she is scheduled for a BiPAP titration study and will hopefully get more regular treatment of her sleep apnea.  ECG today shows sinus rhythm with leftward axis.   Allergies  Allergen Reactions  . Hyoscyamine Sulfate Other (See Comments)    TACHYCARDIA  . Metoclopramide Hcl Other (See Comments)     TREMORS    Current Outpatient Prescriptions  Medication Sig Dispense Refill  . albuterol (PROAIR HFA) 108 (90 BASE) MCG/ACT inhaler Inhale 2 puffs into the lungs every 6 (six) hours as needed.      Marland Kitchen albuterol (PROVENTIL) (2.5 MG/3ML) 0.083% nebulizer solution Take 2.5 mg by nebulization every 6 (six) hours as needed. For shortness of breath.       Marland Kitchen albuterol (PROVENTIL) (2.5 MG/3ML) 0.083% nebulizer solution Take 2.5 mg by nebulization every 4 (four) hours as needed.      Marland Kitchen amoxicillin (AMOXIL) 500 MG capsule Take 2,000 mg by mouth as directed.      . baclofen (LIORESAL) 20 MG tablet Take 20 mg by mouth 3 (three) times daily.        Marland Kitchen buPROPion (WELLBUTRIN) 75 MG tablet Take 75 mg by mouth every other day.      . Cyanocobalamin (VITAMIN B-12 IJ) Inject 1 mL as directed every 30 (thirty) days.       . diazepam (VALIUM) 5 MG tablet Take 5 mg by mouth every 8 (eight) hours as needed. For anxiety.       . diclofenac sodium (VOLTAREN) 1 % GEL Apply 2-4 g topically 4 (four) times daily as needed.       . diltiazem (CARDIZEM CD) 120 MG 24 hr capsule Take 120 mg by mouth daily.      Marland Kitchen esomeprazole (NEXIUM) 40 MG capsule Take 40 mg by mouth daily before breakfast.        . furosemide (LASIX) 80 MG tablet Take 80 mg by mouth 2 (two) times daily.        Marland Kitchen gabapentin (NEURONTIN) 400 MG capsule Take 400 mg by mouth 4 (four) times daily.        . hydrOXYzine (ATARAX/VISTARIL) 25 MG tablet Take 25 mg by mouth 2 (two) times daily as needed.      . isosorbide mononitrate (IMDUR) 120 MG 24 hr tablet Take 120 mg by mouth daily.        . isosorbide mononitrate (IMDUR) 30 MG 24 hr tablet Take 30 mg by mouth daily.        Marland Kitchen l-methylfolate-B6-B12 (METANX) 3-35-2 MG TABS Take 1 tablet by mouth daily.        Marland Kitchen lactulose (CHRONULAC) 10 GM/15ML solution Take 15 mLs by mouth 2 (two) times daily as needed.      . loratadine (CLARITIN) 10 MG tablet Take 10 mg by mouth daily.        . niacin (NIASPAN) 500 MG CR tablet Take 500 mg by mouth at bedtime.        . nitroGLYCERIN (NITROLINGUAL) 0.4 MG/SPRAY spray Place 1-2 sprays under the tongue every 5 (five) minutes as needed. For chest pain.       Marland Kitchen  oxyCODONE (OXYCONTIN) 40 MG 12 hr tablet Take 40 mg by mouth every 12 (twelve) hours.        Marland Kitchen oxyCODONE-acetaminophen (PERCOCET) 10-325 MG per tablet Take 1 tablet by mouth every 6 (six) hours as needed. For pain.      . potassium chloride SA (K-DUR,KLOR-CON) 20 MEQ tablet Take 40 mEq by mouth 4 (four) times daily.        . predniSONE (DELTASONE) 10 MG tablet Take 1 tablet by mouth Daily.      . Rivaroxaban (XARELTO) 20 MG TABS Take 20 mg by mouth daily.      Marland Kitchen spironolactone (ALDACTONE) 50 MG tablet Take 50 mg by mouth 3 (three) times daily.        . isosorbide mononitrate (IMDUR) 30 MG 24 hr tablet Take 30 mg by mouth daily.        Past Medical History  Diagnosis Date  . Atrial fibrillation     Paroxysmal  . Essential hypertension, benign   . Type 2 diabetes mellitus   . History of cardiac catheterization     Normal coronary arteries 11/12  . Esophageal spasm     NTG and Norvasc  . Fibromyalgia   . Irritable bowel syndrome   . COPD (chronic obstructive pulmonary disease)   .  Lymphedema     Social History Ms. Appleman reports that she quit smoking about 4 years ago. Her smoking use included Cigarettes. She has a 45 pack-year smoking history. She has never used smokeless tobacco. Ms. Zinda reports that she does not drink alcohol.  Review of Systems No palpitations or dizziness. Otherwise negative except as outlined.  Physical Examination Filed Vitals:   10/30/11 1455  BP: 110/70  Pulse: 67   Filed Weights   10/30/11 1455  Weight: 289 lb (131.09 kg)   Patient in no acute distress.  HEENT: Conjunctiva and lids normal, oropharynx clear with moist mucosa.  Neck: Supple, no elevated JVP or carotid bruits, no thyromegaly.  Lungs: Clear to auscultation, nonlabored breathing at rest.  Cardiac: Regular rate and rhythm, no S3 or significant systolic murmur, no pericardial rub.  Abdomen: Soft, nontender, bowel sounds present, no guarding or rebound.  Extremities: Mild edema, distal pulses 2+.    Problem List and Plan   Atrial fibrillation Maintains sinus rhythm following elective DCCV. Continue current regimen and followup arranged.  OBSTRUCTIVE SLEEP APNEA Pending BiPAP titration study.  Essential hypertension, benign Blood pressure normal today.    Jonelle Sidle, M.D., F.A.C.C.

## 2011-11-03 ENCOUNTER — Telehealth: Payer: Self-pay | Admitting: *Deleted

## 2011-11-03 DIAGNOSIS — R319 Hematuria, unspecified: Secondary | ICD-10-CM

## 2011-11-03 DIAGNOSIS — I4891 Unspecified atrial fibrillation: Secondary | ICD-10-CM

## 2011-11-03 NOTE — Telephone Encounter (Signed)
Patient and Vyas's office informed. Patient verbalized understanding of plan. Lab order faxed to [facility].

## 2011-11-03 NOTE — Telephone Encounter (Signed)
Please have her get a CBC and UA. She should also go by Dr. Bonnita Levan office to get stool cards to check for any guaiac-positive stools. We can then decide on the need to modify medications and whether she needs any further workup.

## 2011-11-03 NOTE — Telephone Encounter (Signed)
Patient started taking xarelto on September 10 th 2013 and on yesterday and today patient noticed dark stools and urine is darker. Patient also said that she has started having bilateral calf pain. Patient noticed that after brushing teeth on last week, that she spit out blood x's one, and hasn't noticed this before taking xarelto.  Patient also notice that she has started having headaches. Patient denies dizziness,chest pain or sob. Nurse advised patient that these could all be potential side effects of xarelto and physician would be notified for recommendations.

## 2011-11-07 ENCOUNTER — Ambulatory Visit: Payer: Medicare Other | Admitting: Cardiology

## 2011-11-07 ENCOUNTER — Telehealth: Payer: Self-pay | Admitting: *Deleted

## 2011-11-07 NOTE — Telephone Encounter (Signed)
Patient informed. 

## 2011-11-07 NOTE — Telephone Encounter (Signed)
Message copied by Eustace Moore on Tue Nov 07, 2011 11:07 AM ------      Message from: MCDOWELL, Illene Bolus      Created: Mon Nov 06, 2011  2:24 PM       Hemoglobin is normal at 14.5, platelets normal at 299, urinalysis also normal without blood. Would followup stool cards with Dr. Sherril Croon. Not entirely clear whether her described symptoms are related to Xarelto at this point.

## 2011-12-08 ENCOUNTER — Other Ambulatory Visit: Payer: Self-pay | Admitting: Neurosurgery

## 2011-12-08 DIAGNOSIS — M545 Low back pain, unspecified: Secondary | ICD-10-CM

## 2011-12-15 ENCOUNTER — Ambulatory Visit
Admission: RE | Admit: 2011-12-15 | Discharge: 2011-12-15 | Disposition: A | Discharge status: Home / Self Care | Payer: Medicare Other | Source: Ambulatory Visit | Attending: Neurosurgery | Admitting: Neurosurgery

## 2011-12-15 DIAGNOSIS — M545 Low back pain, unspecified: Secondary | ICD-10-CM

## 2011-12-15 MED ORDER — GADOBENATE DIMEGLUMINE 529 MG/ML IV SOLN
20.0000 mL | Freq: Once | INTRAVENOUS | Status: AC | PRN
  Administered 2011-12-15: 20 mL via INTRAVENOUS

## 2012-01-24 ENCOUNTER — Other Ambulatory Visit: Payer: Self-pay | Admitting: Neurosurgery

## 2012-01-24 DIAGNOSIS — G9519 Other vascular myelopathies: Secondary | ICD-10-CM

## 2012-01-24 DIAGNOSIS — M5410 Radiculopathy, site unspecified: Secondary | ICD-10-CM

## 2012-01-24 DIAGNOSIS — M549 Dorsalgia, unspecified: Secondary | ICD-10-CM

## 2012-01-24 DIAGNOSIS — M48062 Spinal stenosis, lumbar region with neurogenic claudication: Secondary | ICD-10-CM

## 2012-01-31 ENCOUNTER — Ambulatory Visit: Payer: Medicare Other | Admitting: Cardiology

## 2012-02-06 ENCOUNTER — Encounter: Payer: Self-pay | Admitting: Cardiology

## 2012-02-06 ENCOUNTER — Ambulatory Visit (INDEPENDENT_AMBULATORY_CARE_PROVIDER_SITE_OTHER): Payer: Medicare Other | Admitting: Cardiology

## 2012-02-06 VITALS — BP 112/70 | HR 63 | Ht 69.0 in | Wt 299.8 lb

## 2012-02-06 DIAGNOSIS — J449 Chronic obstructive pulmonary disease, unspecified: Secondary | ICD-10-CM

## 2012-02-06 DIAGNOSIS — R072 Precordial pain: Secondary | ICD-10-CM | POA: Insufficient documentation

## 2012-02-06 DIAGNOSIS — I10 Essential (primary) hypertension: Secondary | ICD-10-CM

## 2012-02-06 DIAGNOSIS — I4891 Unspecified atrial fibrillation: Secondary | ICD-10-CM

## 2012-02-06 DIAGNOSIS — G4733 Obstructive sleep apnea (adult) (pediatric): Secondary | ICD-10-CM

## 2012-02-06 DIAGNOSIS — R079 Chest pain, unspecified: Secondary | ICD-10-CM

## 2012-02-06 NOTE — Assessment & Plan Note (Signed)
Maintaining sinus rhythm by ECG today. She does report occasional palpitations, but has had no clearly defined atrial fibrillation since her cardioversion. She continues on Cardizem and Xarelto.

## 2012-02-06 NOTE — Progress Notes (Signed)
Clinical Summary [name] [name] is a 52 y.o.female presenting for followup. She was seen in October 2013. She came into the office today complaining of significant chest discomfort, reported that it started shortly before coming in for her visit, states that she was late and was rushing. She reports waves of chest discomfort, cannot discern whether it is related to esophageal spasm or not. She has not tried any nitroglycerin until she came in.  ECG today shows sinus rhythm with prolonged PR interval and IVCD/LAFB. She was given nitroglycerin spray, reported some improvement but not resolution in symptoms. Followup tracing obtained, shows continued sinus rhythm with no acute ST segment abnormalities.  She does report occasional palpitations, has not had any definitively documented recurrent atrial fibrillation however. She continues on anticoagulant therapy with Xarelto. Also continues on diltiazem.  Cardiac history includes documentation of normal coronary arteries at cardiac catheterization by Dr. Jacinto Halim in November 2012. She has not had any followup ischemic testing. Echocardiogram in September 2013 revealed LVEF 60-65%, No major valvular abnormalities. Of note, she has been on high-dose long-acting nitrates for the possibility of recurring esophageal spasm versus coronary vasospasm. Of note, she does not have any acute ST segment elevation with her symptoms to suggest definitive ongoing coronary vasospasm with her symptoms.   Allergies  Allergen Reactions  . Hyoscyamine Sulfate Other (See Comments)    TACHYCARDIA  . Metoclopramide Hcl Other (See Comments)     TREMORS    Current Outpatient Prescriptions  Medication Sig Dispense Refill  . albuterol (PROAIR HFA) 108 (90 BASE) MCG/ACT inhaler Inhale 2 puffs into the lungs every 6 (six) hours as needed.      Marland Kitchen albuterol (PROVENTIL) (2.5 MG/3ML) 0.083% nebulizer solution Take 2.5 mg by nebulization every 6 (six) hours as needed. For shortness of  breath.       Marland Kitchen albuterol (PROVENTIL) (2.5 MG/3ML) 0.083% nebulizer solution Take 2.5 mg by nebulization every 4 (four) hours as needed.      Marland Kitchen amoxicillin (AMOXIL) 500 MG capsule Take 2,000 mg by mouth as directed.      . baclofen (LIORESAL) 20 MG tablet Take 20 mg by mouth 3 (three) times daily.        Marland Kitchen buPROPion (WELLBUTRIN) 75 MG tablet Take 75 mg by mouth every other day.      . Cyanocobalamin (VITAMIN B-12 IJ) Inject 1 mL as directed every 30 (thirty) days.       . diazepam (VALIUM) 5 MG tablet Take 5 mg by mouth every 8 (eight) hours as needed. For anxiety.       . diclofenac sodium (VOLTAREN) 1 % GEL Apply 2-4 g topically 4 (four) times daily as needed.       . diltiazem (CARDIZEM CD) 120 MG 24 hr capsule Take 120 mg by mouth daily.      Marland Kitchen esomeprazole (NEXIUM) 40 MG capsule Take 40 mg by mouth daily before breakfast.      . furosemide (LASIX) 80 MG tablet Take 80 mg by mouth 2 (two) times daily.        Marland Kitchen gabapentin (NEURONTIN) 400 MG capsule Take 400 mg by mouth 4 (four) times daily.        . hydrOXYzine (ATARAX/VISTARIL) 25 MG tablet Take 25 mg by mouth 2 (two) times daily as needed.      . isosorbide mononitrate (IMDUR) 120 MG 24 hr tablet Take 120 mg by mouth every morning.       . isosorbide mononitrate (IMDUR) 30 MG  24 hr tablet Take 30 mg by mouth every evening.       Marland Kitchen l-methylfolate-B6-B12 (METANX) 3-35-2 MG TABS Take 1 tablet by mouth daily.        Marland Kitchen lactulose (CHRONULAC) 10 GM/15ML solution Take 15 mLs by mouth 2 (two) times daily as needed.      . loratadine (CLARITIN) 10 MG tablet Take 10 mg by mouth daily.        . niacin (NIASPAN) 500 MG CR tablet Take 500 mg by mouth at bedtime.        . nitroGLYCERIN (NITROLINGUAL) 0.4 MG/SPRAY spray Place 1-2 sprays under the tongue every 5 (five) minutes as needed. For chest pain.       . NON FORMULARY BIPAP - STARTED ABOUT 1 MONTH AGO.      Marland Kitchen oxyCODONE (OXYCONTIN) 40 MG 12 hr tablet Take 40 mg by mouth every 12 (twelve) hours.          Marland Kitchen oxyCODONE-acetaminophen (PERCOCET) 10-325 MG per tablet Take 1 tablet by mouth every 6 (six) hours as needed. For pain.      . potassium chloride SA (K-DUR,KLOR-CON) 20 MEQ tablet Take 40 mEq by mouth 4 (four) times daily.        . Rivaroxaban (XARELTO) 20 MG TABS Take 20 mg by mouth daily.      Marland Kitchen spironolactone (ALDACTONE) 50 MG tablet Take 50 mg by mouth 3 (three) times daily.          Past Medical History  Diagnosis Date  . Atrial fibrillation     Paroxysmal  . Essential hypertension, benign   . Type 2 diabetes mellitus   . History of cardiac catheterization     Normal coronary arteries 11/12  . Esophageal spasm     NTG and Norvasc  . Fibromyalgia   . Irritable bowel syndrome   . COPD (chronic obstructive pulmonary disease)   . Lymphedema     Social History [name] reports that she quit smoking about 5 years ago. Her smoking use included Cigarettes. She has a 45 pack-year smoking history. She has never used smokeless tobacco. [name] reports that she does not drink alcohol.  Review of Systems Outlined above. No reported fevers or chills, no cough. Has intermittent chest pain symptoms, for which she uses nitroglycerin. No change in baseline appetite. States her weight has increased.  Physical Examination Filed Vitals:   02/06/12 1438  BP: 112/70  Pulse: 63   Filed Weights   02/06/12 1438  Weight: 299 lb 12.8 oz (135.988 kg)    Patient reports chest discomfort, appears uncomfortable. HEENT: Conjunctiva and lids normal, oropharynx clear.  Neck: No elevated JVP or carotid bruits.  Lungs: Clear to auscultation, nonlabored breathing at rest.  Cardiac: Regular rate and rhythm, no S3 or significant systolic murmur, no pericardial rub.  Abdomen: Soft, nontender, bowel sounds present.  Extremities: Mild edema, distal pulses 2+.    Problem List and Plan   Precordial pain Recurrent over time, however presents to the office today with more prolonged, intense  symptoms which just recently began. She has been given nitroglycerin in the office with some improvement although no resolution as yet. Vital signs are normal, and serial ECGs do not show acute ST segment changes at this time. Importantly, she also remains in sinus rhythm. Etiology of symptoms is not entirely certain at this point. She has history of possible esophageal spasm and questionable coronary vasospasm (notably no active ST segment change with her symptoms would  argue against this). She has a history of normal coronary arteries at cardiac catheterization in November 2012, has not had followup ischemic workup since then. Situation reviewed in detail with the patient and her sister who was summoned to the office by phone. Recommendation is for further evaluation in the ER with possible plan for hospital observation. Offered to call EMS to have her transported, however they declined and will travel across the street to [place] via private vehicle. Situation discussed with Dr. Timmothy Euler. Note being sent to ER with copies of ECGs.  Atrial fibrillation Maintaining sinus rhythm by ECG today. She does report occasional palpitations, but has had no clearly defined atrial fibrillation since her cardioversion. She continues on Cardizem and Xarelto.  OBSTRUCTIVE SLEEP APNEA States that she has been using CPAP.  COPD Could potentially be contributing to symptoms as well. Chest x-ray to be obtained.  Essential hypertension, benign Blood pressure is normal today.    Jonelle Sidle, M.D., F.A.C.C.

## 2012-02-06 NOTE — Assessment & Plan Note (Signed)
Could potentially be contributing to symptoms as well. Chest x-ray to be obtained.

## 2012-02-06 NOTE — Assessment & Plan Note (Addendum)
Recurrent over time, however presents to the office today with more prolonged, intense symptoms which just recently began. She has been given nitroglycerin in the office with some improvement although no resolution as yet. Vital signs are normal, and serial ECGs do not show acute ST segment changes at this time. Importantly, she also remains in sinus rhythm. Etiology of symptoms is not entirely certain at this point. She has history of possible esophageal spasm and questionable coronary vasospasm (notably no active ST segment change with her symptoms would argue against this). She has a history of normal coronary arteries at cardiac catheterization in November 2012, has not had followup ischemic workup since then. Situation reviewed in detail with the patient and her sister who was summoned to the office by phone. Recommendation is for further evaluation in the ER with possible plan for hospital observation. Offered to call EMS to have her transported, however they declined and will travel across the street to [place] via private vehicle. Situation discussed with Dr. Timmothy Euler. Note being sent to ER with copies of ECGs.

## 2012-02-06 NOTE — Assessment & Plan Note (Signed)
Blood pressure is normal today. 

## 2012-02-06 NOTE — Assessment & Plan Note (Signed)
States that she has been using CPAP.

## 2012-02-07 DIAGNOSIS — R079 Chest pain, unspecified: Secondary | ICD-10-CM

## 2012-02-14 ENCOUNTER — Other Ambulatory Visit: Payer: Medicare Other

## 2012-02-21 ENCOUNTER — Encounter: Payer: Medicare Other | Admitting: Physician Assistant

## 2012-02-21 ENCOUNTER — Encounter: Payer: Self-pay | Admitting: Physician Assistant

## 2012-02-21 ENCOUNTER — Other Ambulatory Visit: Payer: Medicare Other

## 2012-02-22 NOTE — Progress Notes (Signed)
Patient ID: [name] [name], female   DOB: 17-May-1960, 52 y.o.   MRN: [id number] error

## 2012-02-28 ENCOUNTER — Ambulatory Visit
Admission: RE | Admit: 2012-02-28 | Discharge: 2012-02-28 | Disposition: A | Discharge status: Home / Self Care | Payer: Medicare Other | Source: Ambulatory Visit | Attending: Neurosurgery | Admitting: Neurosurgery

## 2012-02-28 VITALS — BP 139/65 | HR 63 | Ht 68.5 in | Wt 292.0 lb

## 2012-02-28 DIAGNOSIS — M549 Dorsalgia, unspecified: Secondary | ICD-10-CM

## 2012-02-28 DIAGNOSIS — M5410 Radiculopathy, site unspecified: Secondary | ICD-10-CM

## 2012-02-28 DIAGNOSIS — M48062 Spinal stenosis, lumbar region with neurogenic claudication: Secondary | ICD-10-CM

## 2012-02-28 DIAGNOSIS — G9519 Other vascular myelopathies: Secondary | ICD-10-CM

## 2012-02-28 MED ORDER — IOHEXOL 180 MG/ML  SOLN
15.0000 mL | Freq: Once | INTRAMUSCULAR | Status: AC | PRN
  Administered 2012-02-28: 15 mL via INTRATHECAL

## 2012-02-28 MED ORDER — ONDANSETRON HCL 4 MG/2ML IJ SOLN
4.0000 mg | Freq: Once | INTRAMUSCULAR | Status: AC
  Administered 2012-02-28: 4 mg via INTRAMUSCULAR

## 2012-02-28 MED ORDER — DIAZEPAM 5 MG PO TABS
10.0000 mg | ORAL_TABLET | Freq: Once | ORAL | Status: AC
  Administered 2012-02-28: 10 mg via ORAL

## 2012-02-28 MED ORDER — MEPERIDINE HCL 100 MG/ML IJ SOLN
100.0000 mg | Freq: Once | INTRAMUSCULAR | Status: AC
  Administered 2012-02-28: 100 mg via INTRAMUSCULAR

## 2012-02-28 NOTE — Progress Notes (Signed)
Patient states she has been off Bupropion since Sunday (02/25/12).  Last dose of Xarelto was Monday, 02/26/12.  jkl

## 2012-03-04 ENCOUNTER — Encounter: Payer: Medicare Other | Admitting: Physician Assistant

## 2012-04-09 ENCOUNTER — Other Ambulatory Visit: Payer: Self-pay | Admitting: Physician Assistant

## 2012-05-02 ENCOUNTER — Encounter: Payer: Self-pay | Admitting: Cardiology

## 2012-05-02 ENCOUNTER — Ambulatory Visit (INDEPENDENT_AMBULATORY_CARE_PROVIDER_SITE_OTHER): Payer: Medicare Other | Admitting: Cardiology

## 2012-05-02 VITALS — BP 111/69 | HR 66 | Ht 68.5 in | Wt 302.0 lb

## 2012-05-02 DIAGNOSIS — I10 Essential (primary) hypertension: Secondary | ICD-10-CM

## 2012-05-02 DIAGNOSIS — R072 Precordial pain: Secondary | ICD-10-CM

## 2012-05-02 DIAGNOSIS — I4891 Unspecified atrial fibrillation: Secondary | ICD-10-CM

## 2012-05-02 NOTE — Assessment & Plan Note (Signed)
Paroxysmal, presently in sinus rhythm. Continue current cardiac medications, followup in 6 months.

## 2012-05-02 NOTE — Assessment & Plan Note (Signed)
No significant recurrences. Patient has history of normal coronary arteries at catheterization in November 2012, low risk followup Cardiolite done recently in February.

## 2012-05-02 NOTE — Progress Notes (Signed)
Clinical Summary [name] [name] is a 52 y.o.female last seen in February 2014. At that time she was admitted to the hospital with recurring episodes of chest pain. She ruled out for infarct with normal troponin I levels, and underwent a Lexiscan Cardiolite that demonstrated no diagnostic ST segment abnormalities with evidence of attenuation artifact, no ischemia, LVEF 73%.  She reports no signiicant chest pain symptoms or palpitations at this point. ECG today shows normal sinus rhythm, nonspecific IVCD.  She reports compliance with her cardiac medications, no significant bleeding episodes on Xarelto.  She states she may be having surgery at some point over the next year, possibly her back or a bladder suspension. Nothing is scheduled at this time.   Allergies  Allergen Reactions  . Hyoscyamine Sulfate Other (See Comments)    TACHYCARDIA  . Metoclopramide Hcl Other (See Comments)     TREMORS    Current Outpatient Prescriptions  Medication Sig Dispense Refill  . albuterol (PROAIR HFA) 108 (90 BASE) MCG/ACT inhaler Inhale 2 puffs into the lungs every 6 (six) hours as needed.      Marland Kitchen albuterol (PROVENTIL) (2.5 MG/3ML) 0.083% nebulizer solution Take 2.5 mg by nebulization every 6 (six) hours as needed. For shortness of breath.       Marland Kitchen amoxicillin (AMOXIL) 500 MG capsule Take 2,000 mg by mouth as directed.      . baclofen (LIORESAL) 20 MG tablet Take 20 mg by mouth 3 (three) times daily.        Marland Kitchen buPROPion (WELLBUTRIN) 75 MG tablet Take 75 mg by mouth every other day.      . Cyanocobalamin (VITAMIN B-12 IJ) Inject 1 mL as directed every 30 (thirty) days.       . diazepam (VALIUM) 5 MG tablet Take 5 mg by mouth every 8 (eight) hours as needed. For anxiety.       . diclofenac sodium (VOLTAREN) 1 % GEL Apply 2-4 g topically 4 (four) times daily as needed.       . diltiazem (CARDIZEM CD) 120 MG 24 hr capsule TAKE 1 CAPSULE BY MOUTH DAILY  30 capsule  6  . esomeprazole (NEXIUM) 40 MG capsule Take  40 mg by mouth daily before breakfast.      . fluconazole (DIFLUCAN) 150 MG tablet as needed.      . furosemide (LASIX) 80 MG tablet Take 80 mg by mouth 2 (two) times daily.        Marland Kitchen gabapentin (NEURONTIN) 600 MG tablet Take 600 mg by mouth 4 (four) times daily.      . hydrOXYzine (ATARAX/VISTARIL) 25 MG tablet Take 25 mg by mouth 2 (two) times daily as needed.      . isosorbide mononitrate (IMDUR) 120 MG 24 hr tablet Take 120 mg by mouth every morning.       . isosorbide mononitrate (IMDUR) 30 MG 24 hr tablet Take 30 mg by mouth every evening.       Marland Kitchen l-methylfolate-B6-B12 (METANX) 3-35-2 MG TABS Take 1 tablet by mouth daily.        Marland Kitchen lactulose (CHRONULAC) 10 GM/15ML solution Take 15 mLs by mouth 2 (two) times daily as needed.      . loratadine (CLARITIN) 10 MG tablet Take 10 mg by mouth daily.        . niacin (NIASPAN) 1000 MG CR tablet Take 1,000 mg by mouth at bedtime.      . nitroGLYCERIN (NITROLINGUAL) 0.4 MG/SPRAY spray Place 1-2 sprays under the tongue every  5 (five) minutes as needed. For chest pain.       . NON FORMULARY BIPAP - STARTED ABOUT 1 MONTH AGO.      Marland Kitchen Nystatin (NYAMYC) 100000 UNIT/GM POWD Apply 1 Bottle topically as needed.       . OPANA ER, CRUSH RESISTANT, 20 MG T12A Take 1 tablet by mouth 2 (two) times daily.      Marland Kitchen oxyCODONE-acetaminophen (PERCOCET) 10-325 MG per tablet Take 1 tablet by mouth every 6 (six) hours as needed. For pain.      . potassium chloride SA (K-DUR,KLOR-CON) 20 MEQ tablet Take 40 mEq by mouth 4 (four) times daily.        . Rivaroxaban (XARELTO) 20 MG TABS Take 20 mg by mouth daily.      Marland Kitchen spironolactone (ALDACTONE) 50 MG tablet Take 50 mg by mouth 3 (three) times daily.         No current facility-administered medications for this visit.    Past Medical History  Diagnosis Date  . Atrial fibrillation     Paroxysmal  . Essential hypertension, benign   . Type 2 diabetes mellitus   . History of cardiac catheterization     Normal coronary arteries  11/12  . Esophageal spasm     NTG and Norvasc  . Fibromyalgia   . Irritable bowel syndrome   . COPD (chronic obstructive pulmonary disease)   . Lymphedema     Social History [name] [name] reports that she quit smoking about 5 years ago. Her smoking use included Cigarettes. She has a 45 pack-year smoking history. She has never used smokeless tobacco. [name] [name] reports that she does not drink alcohol.  Review of Systems Chronic back and leg pain. Using a cane to walk. States that her "bladder has fallen. "Some incontinence. Otherwise negative.  Physical Examination Filed Vitals:   05/02/12 1057  BP: 111/69  Pulse: 66   Filed Weights   05/02/12 1057  Weight: 302 lb (136.986 kg)   Obese woman in no acute distress. HEENT: Conjunctiva and lids normal, oropharynx clear.  Neck: No elevated JVP or carotid bruits.  Lungs: Clear to auscultation, nonlabored breathing at rest.  Cardiac: Regular rate and rhythm, no S3 or significant systolic murmur, no pericardial rub.  Abdomen: Soft, nontender, bowel sounds present.  Extremities: Mild edema, distal pulses 2+.    Problem List and Plan   Atrial fibrillation Paroxysmal, presently in sinus rhythm. Continue current cardiac medications, followup in 6 months.  Essential hypertension, benign Blood pressure control is good today.  Precordial pain No significant recurrences. Patient has history of normal coronary arteries at catheterization in November 2012, low risk followup Cardiolite done recently in February.    Jonelle Sidle, M.D., F.A.C.C.

## 2012-05-02 NOTE — Assessment & Plan Note (Signed)
Blood pressure control is good today. 

## 2012-05-02 NOTE — Patient Instructions (Addendum)

## 2012-09-30 ENCOUNTER — Ambulatory Visit: Payer: No Typology Code available for payment source | Attending: Neurosurgery | Admitting: Physical Therapy

## 2012-09-30 ENCOUNTER — Other Ambulatory Visit: Payer: Self-pay | Admitting: *Deleted

## 2012-09-30 DIAGNOSIS — IMO0001 Reserved for inherently not codable concepts without codable children: Secondary | ICD-10-CM | POA: Insufficient documentation

## 2012-09-30 DIAGNOSIS — M545 Low back pain, unspecified: Secondary | ICD-10-CM | POA: Insufficient documentation

## 2012-09-30 MED ORDER — DILTIAZEM HCL ER COATED BEADS 120 MG PO CP24: 120.0000 mg | ORAL_CAPSULE | Freq: Every day | ORAL | Status: DC

## 2012-10-03 ENCOUNTER — Ambulatory Visit: Payer: No Typology Code available for payment source | Attending: Neurosurgery

## 2012-10-03 DIAGNOSIS — IMO0001 Reserved for inherently not codable concepts without codable children: Secondary | ICD-10-CM | POA: Insufficient documentation

## 2012-10-03 DIAGNOSIS — M545 Low back pain, unspecified: Secondary | ICD-10-CM | POA: Insufficient documentation

## 2012-10-09 ENCOUNTER — Ambulatory Visit: Payer: No Typology Code available for payment source

## 2012-10-10 ENCOUNTER — Ambulatory Visit: Payer: No Typology Code available for payment source

## 2012-10-14 ENCOUNTER — Ambulatory Visit: Payer: No Typology Code available for payment source

## 2012-11-14 ENCOUNTER — Ambulatory Visit (INDEPENDENT_AMBULATORY_CARE_PROVIDER_SITE_OTHER): Payer: Medicare Other | Admitting: Cardiology

## 2012-11-14 ENCOUNTER — Encounter: Payer: Self-pay | Admitting: Cardiology

## 2012-11-14 VITALS — BP 113/69 | HR 67 | Ht 69.0 in | Wt 294.0 lb

## 2012-11-14 DIAGNOSIS — R072 Precordial pain: Secondary | ICD-10-CM

## 2012-11-14 DIAGNOSIS — I4891 Unspecified atrial fibrillation: Secondary | ICD-10-CM

## 2012-11-14 DIAGNOSIS — I10 Essential (primary) hypertension: Secondary | ICD-10-CM

## 2012-11-14 NOTE — Patient Instructions (Signed)
Continue all current medications. Your physician wants you to follow up in: 6 months.  You will receive a reminder letter in the mail one-two months in advance.  If you don't receive a letter, please call our office to schedule the follow up appointment   

## 2012-11-14 NOTE — Assessment & Plan Note (Addendum)
No significant recurrences. Patient has history of normal coronary arteries at catheterization in November 2012, low risk followup Cardiolite done recently in February. Likely also has component of esophageal spasm. She is on nitrates and Norvasc.

## 2012-11-14 NOTE — Assessment & Plan Note (Signed)
Blood pressure is well-controlled today. I asked her to try and take her low-dose lisinopril in the evenings. If she is not able to tolerate this, then she needs to discuss with Dr. Sherril Croon whether the medicine can be stopped altogether.

## 2012-11-14 NOTE — Progress Notes (Signed)
Clinical Summary [name] [name] is a 52 y.o.female last seen in May 2014. She has been stable from a cardiac perspective, no progressive chest pain or palpitations. She was placed on low-dose ACE inhibitor for renal protective effects with diabetes per Dr. Sherril Croon. States that she has been dizzy on this medication with relatively low normal blood pressures. She stop it 4 days ago.  Lexiscan Cardiolite in February 2014 showed no diagnostic ST segment abnormalities with evidence of attenuation artifact, no ischemia, LVEF 73%.   Allergies  Allergen Reactions  . Hyoscyamine Sulfate Other (See Comments)    TACHYCARDIA  . Metoclopramide Hcl Other (See Comments)     TREMORS    Current Outpatient Prescriptions  Medication Sig Dispense Refill  . albuterol (PROAIR HFA) 108 (90 BASE) MCG/ACT inhaler Inhale 2 puffs into the lungs every 6 (six) hours as needed.      Marland Kitchen albuterol (PROVENTIL) (2.5 MG/3ML) 0.083% nebulizer solution Take 2.5 mg by nebulization every 6 (six) hours as needed. For shortness of breath.       Marland Kitchen amoxicillin (AMOXIL) 500 MG capsule Take 2,000 mg by mouth as directed.      . baclofen (LIORESAL) 20 MG tablet Take 20 mg by mouth 3 (three) times daily.        Marland Kitchen buPROPion (WELLBUTRIN) 75 MG tablet Take 75 mg by mouth every other day.      . Cyanocobalamin (VITAMIN B-12 IJ) Inject 1 mL as directed every 30 (thirty) days.       . diazepam (VALIUM) 5 MG tablet Take 5 mg by mouth every 8 (eight) hours as needed. For anxiety.       . diclofenac sodium (VOLTAREN) 1 % GEL Apply 2-4 g topically 4 (four) times daily as needed.       . diltiazem (CARDIZEM CD) 120 MG 24 hr capsule Take 1 capsule (120 mg total) by mouth daily.  30 capsule  6  . esomeprazole (NEXIUM) 40 MG capsule Take 40 mg by mouth daily before breakfast.      . fluconazole (DIFLUCAN) 150 MG tablet as needed.      . furosemide (LASIX) 80 MG tablet Take 80 mg by mouth 2 (two) times daily.        Marland Kitchen gabapentin (NEURONTIN) 600 MG  tablet Take 600 mg by mouth 4 (four) times daily.      . hydrOXYzine (ATARAX/VISTARIL) 25 MG tablet Take 25 mg by mouth 2 (two) times daily as needed.      . isosorbide mononitrate (IMDUR) 120 MG 24 hr tablet Take 120 mg by mouth every morning.       . isosorbide mononitrate (IMDUR) 30 MG 24 hr tablet Take 30 mg by mouth every evening.       Marland Kitchen l-methylfolate-B6-B12 (METANX) 3-35-2 MG TABS Take 1 tablet by mouth daily.        Marland Kitchen lactulose (CHRONULAC) 10 GM/15ML solution Take 15 mLs by mouth 2 (two) times daily as needed.      . loratadine (CLARITIN) 10 MG tablet Take 10 mg by mouth daily.        . niacin (NIASPAN) 1000 MG CR tablet Take 1,000 mg by mouth at bedtime.      . nitroGLYCERIN (NITROLINGUAL) 0.4 MG/SPRAY spray Place 1-2 sprays under the tongue every 5 (five) minutes as needed. For chest pain.       . NON FORMULARY BIPAP - STARTED ABOUT 1 MONTH AGO.      Marland Kitchen Nystatin (NYAMYC) 100000  UNIT/GM POWD Apply 1 Bottle topically as needed.       . OPANA ER, CRUSH RESISTANT, 20 MG T12A Take 1 tablet by mouth 2 (two) times daily.      Marland Kitchen oxyCODONE-acetaminophen (PERCOCET) 10-325 MG per tablet Take 1 tablet by mouth every 6 (six) hours as needed. For pain.      . potassium chloride SA (K-DUR,KLOR-CON) 20 MEQ tablet Take 40 mEq by mouth 4 (four) times daily.        Marland Kitchen spironolactone (ALDACTONE) 50 MG tablet Take 50 mg by mouth 3 (three) times daily.        Marland Kitchen sulfamethoxazole-trimethoprim (BACTRIM DS) 800-160 MG per tablet Take 1 tablet by mouth 2 (two) times daily.       Marland Kitchen lisinopril (PRINIVIL,ZESTRIL) 2.5 MG tablet Take 2.5 mg by mouth daily.       . Rivaroxaban (XARELTO) 20 MG TABS Take 20 mg by mouth daily.       No current facility-administered medications for this visit.    Past Medical History  Diagnosis Date  . Atrial fibrillation     Paroxysmal  . Essential hypertension, benign   . Type 2 diabetes mellitus   . History of cardiac catheterization     Normal coronary arteries 11/12  .  Esophageal spasm     NTG and Norvasc  . Fibromyalgia   . Irritable bowel syndrome   . COPD (chronic obstructive pulmonary disease)   . Lymphedema     Social History [name] [name] reports that she quit smoking about 5 years ago. Her smoking use included Cigarettes. She has a 45 pack-year smoking history. She has never used smokeless tobacco. [name] [name] reports that she does not drink alcohol.  Review of Systems Chronic back pain. Will be having a tooth extraction soon, temporarily off  Xarelto. Otherwise negative.  Physical Examination Filed Vitals:   11/14/12 1041  BP: 113/69  Pulse: 67   Filed Weights   11/14/12 1041  Weight: 294 lb (133.358 kg)    Obese woman in no acute distress.  HEENT: Conjunctiva and lids normal, oropharynx clear.  Neck: No elevated JVP or carotid bruits.  Lungs: Clear to auscultation, nonlabored breathing at rest.  Cardiac: Regular rate and rhythm, no S3 or significant systolic murmur, no pericardial rub.  Abdomen: Soft, nontender, bowel sounds present.  Extremities: Mild edema, distal pulses 2+.    Problem List and Plan   Atrial fibrillation Paroxysmal, symptomatically stable. Continue Cardizem CD and Xarelto.  Precordial pain No significant recurrences. Patient has history of normal coronary arteries at catheterization in November 2012, low risk followup Cardiolite done recently in February. Likely also has component of esophageal spasm. She is on nitrates and Norvasc.  Essential hypertension, benign Blood pressure is well-controlled today. I asked her to try and take her low-dose lisinopril in the evenings. If she is not able to tolerate this, then she needs to discuss with Dr. Sherril Croon whether the medicine can be stopped altogether.    Jonelle Sidle, M.D., F.A.C.C.

## 2012-11-14 NOTE — Assessment & Plan Note (Signed)
Paroxysmal, symptomatically stable. Continue Cardizem CD and Xarelto.

## 2013-01-06 ENCOUNTER — Emergency Department (HOSPITAL_COMMUNITY): Payer: Medicare Other

## 2013-01-06 ENCOUNTER — Telehealth: Payer: Self-pay | Admitting: *Deleted

## 2013-01-06 ENCOUNTER — Encounter (HOSPITAL_COMMUNITY): Payer: Self-pay | Admitting: Emergency Medicine

## 2013-01-06 ENCOUNTER — Emergency Department (HOSPITAL_COMMUNITY)
Admission: EM | Admit: 2013-01-06 | Discharge: 2013-01-07 | Disposition: A | Discharge status: Home / Self Care | Payer: Medicare Other | Attending: Emergency Medicine | Admitting: Emergency Medicine

## 2013-01-06 DIAGNOSIS — Z79899 Other long term (current) drug therapy: Secondary | ICD-10-CM | POA: Insufficient documentation

## 2013-01-06 DIAGNOSIS — Z8719 Personal history of other diseases of the digestive system: Secondary | ICD-10-CM | POA: Insufficient documentation

## 2013-01-06 DIAGNOSIS — Z7901 Long term (current) use of anticoagulants: Secondary | ICD-10-CM | POA: Insufficient documentation

## 2013-01-06 DIAGNOSIS — R072 Precordial pain: Secondary | ICD-10-CM

## 2013-01-06 DIAGNOSIS — I10 Essential (primary) hypertension: Secondary | ICD-10-CM | POA: Insufficient documentation

## 2013-01-06 DIAGNOSIS — I4891 Unspecified atrial fibrillation: Secondary | ICD-10-CM | POA: Insufficient documentation

## 2013-01-06 DIAGNOSIS — Z87891 Personal history of nicotine dependence: Secondary | ICD-10-CM | POA: Insufficient documentation

## 2013-01-06 DIAGNOSIS — R110 Nausea: Secondary | ICD-10-CM | POA: Insufficient documentation

## 2013-01-06 DIAGNOSIS — Z9889 Other specified postprocedural states: Secondary | ICD-10-CM | POA: Insufficient documentation

## 2013-01-06 DIAGNOSIS — J441 Chronic obstructive pulmonary disease with (acute) exacerbation: Secondary | ICD-10-CM | POA: Insufficient documentation

## 2013-01-06 LAB — COMPREHENSIVE METABOLIC PANEL
ALBUMIN: 4.5 g/dL (ref 3.5–5.2)
ALK PHOS: 74 U/L (ref 39–117)
ALT: 16 U/L (ref 0–35)
AST: 16 U/L (ref 0–37)
BUN: 16 mg/dL (ref 6–23)
CHLORIDE: 88 meq/L — AB (ref 96–112)
CO2: 28 mEq/L (ref 19–32)
Calcium: 10.9 mg/dL — ABNORMAL HIGH (ref 8.4–10.5)
Creatinine, Ser: 0.65 mg/dL (ref 0.50–1.10)
GFR calc Af Amer: >90 mL/min (ref >90)
GFR calc non Af Amer: >90 mL/min (ref >90)
Glucose, Bld: 245 mg/dL — ABNORMAL HIGH (ref 70–99)
POTASSIUM: 4.3 meq/L (ref 3.7–5.3)
SODIUM: 135 meq/L — AB (ref 137–147)
TOTAL PROTEIN: 9 g/dL — AB (ref 6.0–8.3)
Total Bilirubin: 0.2 mg/dL — ABNORMAL LOW (ref 0.3–1.2)

## 2013-01-06 LAB — CBC WITH DIFFERENTIAL/PLATELET
BASOS ABS: 0 10*3/uL (ref 0.0–0.1)
BASOS PCT: 0 % (ref 0–1)
EOS ABS: 0.2 10*3/uL (ref 0.0–0.7)
Eosinophils Relative: 2 % (ref 0–5)
HCT: 43.3 % (ref 36.0–46.0)
Hemoglobin: 14.4 g/dL (ref 12.0–15.0)
Lymphocytes Relative: 38 % (ref 12–46)
Lymphs Abs: 5.3 10*3/uL — ABNORMAL HIGH (ref 0.7–4.0)
MCH: 29 pg (ref 26.0–34.0)
MCHC: 33.3 g/dL (ref 30.0–36.0)
MCV: 87.3 fL (ref 78.0–100.0)
MONOS PCT: 6 % (ref 3–12)
Monocytes Absolute: 0.9 10*3/uL (ref 0.1–1.0)
NEUTROS ABS: 7.7 10*3/uL (ref 1.7–7.7)
NEUTROS PCT: 54 % (ref 43–77)
PLATELETS: 358 10*3/uL (ref 150–400)
RBC: 4.96 MIL/uL (ref 3.87–5.11)
RDW: 12.7 % (ref 11.5–15.5)
WBC: 14.2 10*3/uL — ABNORMAL HIGH (ref 4.0–10.5)

## 2013-01-06 LAB — TROPONIN I

## 2013-01-06 MED ORDER — DIAZEPAM 5 MG PO TABS
5.0000 mg | ORAL_TABLET | Freq: Once | ORAL | Status: AC
  Administered 2013-01-06: 5 mg via ORAL
  Filled 2013-01-06: qty 1

## 2013-01-06 NOTE — ED Provider Notes (Signed)
CSN: [id number]     Arrival date & time 01/06/13  1928 History   First MD Initiated Contact with Patient 01/06/13 2236     Chief Complaint  Patient presents with  . Chest Pain   (Consider location/radiation/quality/duration/timing/severity/associated sxs/prior Treatment) HPI Comments: [name] [name] is a 53 y.o. Female presenting with a 3-4 day history of midsternal chest pain, described as aching and pressure, with radiation into her right jaw and worsened with exertion and stress.  She has taken nitroglycerin in addition to her daily imdur with temporary relief.  She also reports sob and nausea, also with exertion.  She has a history of previous episodes of similar symptoms, and is followed by Dr. Diona Browner additionally for her atrial fibrillation and htn.  She denies dizziness, palpitations with this episode.  She states her prior episodes are identical to current, although currently more prolonged.  She has had a cardiac catheterization in 2012 which was negative for coronary disease and also underwent a cardiolyte study 2/14 which was normal, prompted by an episode as described above.  She has been diagnosed with "cardiac spasm" by a prior cardiologist,  Per Dr McDowell's notes, it is unclear if this is coronary vasospasm vs esophageal spasm.   Pt denies palpitations, fevers, chills, peripheral edema, vomiting and abdominal pain.  She took aspirin prior to arrival.     The history is provided by the patient.    Past Medical History  Diagnosis Date  . Atrial fibrillation     Paroxysmal  . Essential hypertension, benign   . Type 2 diabetes mellitus   . History of cardiac catheterization     Normal coronary arteries 11/12  . Esophageal spasm     NTG and Norvasc  . Fibromyalgia   . Irritable bowel syndrome   . COPD (chronic obstructive pulmonary disease)   . Lymphedema    Past Surgical History  Procedure Laterality Date  . Back surgery    . Cholecystectomy    . Appendectomy    .  Abdominal hysterectomy    . Lung biopsy    . Knee surgery     Family History  Problem Relation Age of Onset  . Coronary artery disease Father     Died age 62  . Heart attack Father   . Arrhythmia Father     AF  . Arrhythmia Mother     AF  . Arrhythmia Brother     AF   History  Substance Use Topics  . Smoking status: Former Smoker -- 1.50 packs/day for 30 years    Types: Cigarettes    Quit date: 01/03/2007  . Smokeless tobacco: Never Used  . Alcohol Use: No   OB History   Grav Para Term Preterm Abortions TAB SAB Ect Mult Living                 Review of Systems  Constitutional: Negative for fever.  HENT: Negative for congestion and sore throat.   Eyes: Negative.   Respiratory: Positive for chest tightness and shortness of breath.   Cardiovascular: Positive for chest pain. Negative for palpitations and leg swelling.  Gastrointestinal: Positive for nausea. Negative for vomiting and abdominal pain.  Genitourinary: Negative.   Musculoskeletal: Negative for arthralgias, joint swelling and neck pain.  Skin: Negative.  Negative for rash and wound.  Neurological: Negative for dizziness, weakness, light-headedness, numbness and headaches.  Psychiatric/Behavioral: Negative.     Allergies  Hyoscyamine sulfate and Metoclopramide hcl  Home Medications  Current Outpatient Rx  Name  Route  Sig  Dispense  Refill  . albuterol (PROAIR HFA) 108 (90 BASE) MCG/ACT inhaler   Inhalation   Inhale 2 puffs into the lungs every 6 (six) hours as needed.         Marland Kitchen albuterol (PROVENTIL) (2.5 MG/3ML) 0.083% nebulizer solution   Nebulization   Take 2.5 mg by nebulization every 6 (six) hours as needed. For shortness of breath.          Marland Kitchen amoxicillin (AMOXIL) 500 MG capsule   Oral   Take 2,000 mg by mouth as directed.         . baclofen (LIORESAL) 20 MG tablet   Oral   Take 20 mg by mouth 3 (three) times daily.           Marland Kitchen buPROPion (WELLBUTRIN) 75 MG tablet   Oral   Take 75  mg by mouth every other day.         . Cyanocobalamin (VITAMIN B-12 IJ)   Injection   Inject 1 mL as directed every 30 (thirty) days.          . diazepam (VALIUM) 5 MG tablet   Oral   Take 5 mg by mouth every 8 (eight) hours as needed. For anxiety.          . diclofenac sodium (VOLTAREN) 1 % GEL   Topical   Apply 2-4 g topically 4 (four) times daily as needed.          . diltiazem (CARDIZEM CD) 120 MG 24 hr capsule   Oral   Take 1 capsule (120 mg total) by mouth daily.   30 capsule   6     Generic [id number] CD  120MG /24 Generic [id number] .Marland Kitchen.   . esomeprazole (NEXIUM) 40 MG capsule   Oral   Take 40 mg by mouth daily before breakfast.         . fluconazole (DIFLUCAN) 150 MG tablet      as needed.         . furosemide (LASIX) 80 MG tablet   Oral   Take 80 mg by mouth 2 (two) times daily.           Marland Kitchen gabapentin (NEURONTIN) 600 MG tablet   Oral   Take 600 mg by mouth 4 (four) times daily.         . hydrOXYzine (ATARAX/VISTARIL) 25 MG tablet   Oral   Take 25 mg by mouth 2 (two) times daily as needed.         . isosorbide mononitrate (IMDUR) 120 MG 24 hr tablet   Oral   Take 120 mg by mouth every morning.          . isosorbide mononitrate (IMDUR) 30 MG 24 hr tablet   Oral   Take 30 mg by mouth every evening.          Marland Kitchen l-methylfolate-B6-B12 (METANX) 3-35-2 MG TABS   Oral   Take 1 tablet by mouth daily.           Marland Kitchen lactulose (CHRONULAC) 10 GM/15ML solution   Oral   Take 15 mLs by mouth 2 (two) times daily as needed.         Marland Kitchen lisinopril (PRINIVIL,ZESTRIL) 2.5 MG tablet   Oral   Take 2.5 mg by mouth daily.          Marland Kitchen loratadine (CLARITIN) 10 MG tablet   Oral   Take 10 mg by mouth  daily.           . niacin (NIASPAN) 1000 MG CR tablet   Oral   Take 1,000 mg by mouth at bedtime.         . nitroGLYCERIN (NITROLINGUAL) 0.4 MG/SPRAY spray   Sublingual   Place 1-2 sprays under the tongue every 5 (five) minutes as needed. For  chest pain.          . NON FORMULARY      BIPAP - STARTED ABOUT 1 MONTH AGO.         Marland Kitchen Nystatin (NYAMYC) 100000 UNIT/GM POWD   Topical   Apply 1 Bottle topically as needed.          . OPANA ER, CRUSH RESISTANT, 20 MG T12A   Oral   Take 1 tablet by mouth 2 (two) times daily.         Marland Kitchen oxyCODONE-acetaminophen (PERCOCET) 10-325 MG per tablet   Oral   Take 1 tablet by mouth every 6 (six) hours as needed. For pain.         . potassium chloride SA (K-DUR,KLOR-CON) 20 MEQ tablet   Oral   Take 40 mEq by mouth 4 (four) times daily.           . Rivaroxaban (XARELTO) 20 MG TABS   Oral   Take 20 mg by mouth daily.         Marland Kitchen spironolactone (ALDACTONE) 50 MG tablet   Oral   Take 50 mg by mouth 3 (three) times daily.           Marland Kitchen sulfamethoxazole-trimethoprim (BACTRIM DS) 800-160 MG per tablet   Oral   Take 1 tablet by mouth 2 (two) times daily.           BP 124/65  Pulse 69  Temp(Src) 97.4 F (36.3 C) (Oral)  Resp 20  Ht 5\' 9"  (1.753 m)  Wt 292 lb (132.45 kg)  BMI 43.10 kg/m2  SpO2 98% Physical Exam  Nursing note and vitals reviewed. Constitutional: She appears well-developed and well-nourished. No distress.  HENT:  Head: Normocephalic and atraumatic.  Eyes: Conjunctivae are normal.  Neck: Normal range of motion.  Cardiovascular: Normal rate, regular rhythm, normal heart sounds and intact distal pulses.   No murmur heard. Pulmonary/Chest: Effort normal and breath sounds normal. She has no wheezes.  Abdominal: Soft. Bowel sounds are normal. She exhibits no distension. There is no tenderness.  Musculoskeletal: Normal range of motion.  Neurological: She is alert.  Skin: Skin is warm and dry.  Psychiatric: She has a normal mood and affect.    ED Course  Procedures (including critical care time) Labs Review Labs Reviewed  CBC WITH DIFFERENTIAL - Abnormal; Notable for the following:    WBC 14.2 (*)    Lymphs Abs 5.3 (*)    All other components within  normal limits  COMPREHENSIVE METABOLIC PANEL - Abnormal; Notable for the following:    Sodium 135 (*)    Chloride 88 (*)    Glucose, Bld 245 (*)    Calcium 10.9 (*)    Total Protein 9.0 (*)    Total Bilirubin 0.2 (*)    All other components within normal limits  TROPONIN I   Imaging Review Dg Chest 2 View  01/06/2013   CLINICAL DATA:  Chest pain and shortness of breath  EXAM: CHEST  2 VIEW  COMPARISON:  February 06, 2012  FINDINGS: There is a degree of underlying emphysema. There is no edema or  consolidation. The heart size is normal. Pulmonary vascularity reflects underlying emphysema. No adenopathy. No bone lesions.  IMPRESSION: Underlying emphysematous change.  No edema or consolidation.   Electronically Signed   By: Lowella Grip M.D.   On: 01/06/2013 19:57    EKG Interpretation    Date/Time:  Monday January 06 2013 19:38:02 EST Ventricular Rate:  69 PR Interval:  170 QRS Duration: 108 QT Interval:  370 QTC Calculation: 396 R Axis:   -26 Text Interpretation:  Normal sinus rhythm Incomplete right bundle branch block Left ventricular hypertrophy with repolarization abnormality When compared with ECG of 23-May-2010 15:32, No significant change was found Confirmed by [facility]  MD, JOHN ([id number]) on 01/06/2013 7:48:20 PM            MDM   1. Precordial pain    Patients labs and/or radiological studies were viewed and considered during the medical decision making and disposition process. Pt was discussed with Dr. Lacinda Axon who agreed with dc home.  4 day hx of sx with prior episodes of same, negative troponin, normal vital signs.  No evidence for cardiac process.  Doubt PE with normal sats, respirations and pulse.  Intermittent symptoms.  Advised f/u with her cardiologist this week for a recheck if sx persist.  She was sx free at time of dc.    Evalee Jefferson, PA-C 01/07/13 1217

## 2013-01-06 NOTE — ED Notes (Signed)
Pt reports having taking nitro for the past 4 days, today was the worse. Pt states she took 4 nitro & asa at home. Pt under more stress & she says she is more SOB while walking than normal.

## 2013-01-06 NOTE — ED Notes (Signed)
Has used ntg intermittently for 3-4 days for chest pain.  , Sob.  nausea

## 2013-01-06 NOTE — Telephone Encounter (Signed)
Patient c/o intermittent chest pain that started on Friday rated #8 on a scale of 1-10 (10 being the greatest) with sob. Patient said today she also had nausea with the chest pain. Patient isn't having chest pain now while on the phone. No c/o dizziness. Patient has been using her nitroglycerin and said she did get relief. Nurse advised patient to go to [facility] ED for evaluation. Patient verbalized understanding of plan.

## 2013-01-07 NOTE — ED Notes (Signed)
Pt alert & oriented x4, stable gait. Patient given discharge instructions, paperwork & prescription(s). Patient  instructed to stop at the registration desk to finish any additional paperwork. Patient verbalized understanding. Pt left department w/ no further questions. 

## 2013-01-07 NOTE — Discharge Instructions (Signed)
Chest Pain (Nonspecific) °It is often hard to give a specific diagnosis for the cause of chest pain. There is always a chance that your pain could be related to something serious, such as a heart attack or a blood clot in the lungs. You need to follow up with your caregiver for further evaluation. °CAUSES  °· Heartburn. °· Pneumonia or bronchitis. °· Anxiety or stress. °· Inflammation around your heart (pericarditis) or lung (pleuritis or pleurisy). °· A blood clot in the lung. °· A collapsed lung (pneumothorax). It can develop suddenly on its own (spontaneous pneumothorax) or from injury (trauma) to the chest. °· Shingles infection (herpes zoster virus). °The chest wall is composed of bones, muscles, and cartilage. Any of these can be the source of the pain. °· The bones can be bruised by injury. °· The muscles or cartilage can be strained by coughing or overwork. °· The cartilage can be affected by inflammation and become sore (costochondritis). °DIAGNOSIS  °Lab tests or other studies, such as X-rays, electrocardiography, stress testing, or cardiac imaging, may be needed to find the cause of your pain.  °TREATMENT  °· Treatment depends on what may be causing your chest pain. Treatment may include: °· Acid blockers for heartburn. °· Anti-inflammatory medicine. °· Pain medicine for inflammatory conditions. °· Antibiotics if an infection is present. °· You may be advised to change lifestyle habits. This includes stopping smoking and avoiding alcohol, caffeine, and chocolate. °· You may be advised to keep your head raised (elevated) when sleeping. This reduces the chance of acid going backward from your stomach into your esophagus. °· Most of the time, nonspecific chest pain will improve within 2 to 3 days with rest and mild pain medicine. °HOME CARE INSTRUCTIONS  °· If antibiotics were prescribed, take your antibiotics as directed. Finish them even if you start to feel better. °· For the next few days, avoid physical  activities that bring on chest pain. Continue physical activities as directed. °· Do not smoke. °· Avoid drinking alcohol. °· Only take over-the-counter or prescription medicine for pain, discomfort, or fever as directed by your caregiver. °· Follow your caregiver's suggestions for further testing if your chest pain does not go away. °· Keep any follow-up appointments you made. If you do not go to an appointment, you could develop lasting (chronic) problems with pain. If there is any problem keeping an appointment, you must call to reschedule. °SEEK MEDICAL CARE IF:  °· You think you are having problems from the medicine you are taking. Read your medicine instructions carefully. °· Your chest pain does not go away, even after treatment. °· You develop a rash with blisters on your chest. °SEEK IMMEDIATE MEDICAL CARE IF:  °· You have increased chest pain or pain that spreads to your arm, neck, jaw, back, or abdomen. °· You develop shortness of breath, an increasing cough, or you are coughing up blood. °· You have severe back or abdominal pain, feel nauseous, or vomit. °· You develop severe weakness, fainting, or chills. °· You have a fever. °THIS IS AN EMERGENCY. Do not wait to see if the pain will go away. Get medical help at once. Call your local emergency services (911 in [place] Do not drive yourself to the hospital. °MAKE SURE YOU:  °· Understand these instructions. °· Will watch your condition. °· Will get help right away if you are not doing well or get worse. °Document Released: 09/28/2004 Document Revised: 03/13/2011 Document Reviewed: 07/25/2007 °[employer]® Patient Information ©2014 [employer]   [employer]. ° °

## 2013-01-09 ENCOUNTER — Ambulatory Visit (INDEPENDENT_AMBULATORY_CARE_PROVIDER_SITE_OTHER): Payer: Medicare Other | Admitting: Cardiology

## 2013-01-09 ENCOUNTER — Encounter: Payer: Self-pay | Admitting: Cardiology

## 2013-01-09 VITALS — BP 122/78 | HR 68 | Ht 69.0 in | Wt 288.8 lb

## 2013-01-09 DIAGNOSIS — R072 Precordial pain: Secondary | ICD-10-CM

## 2013-01-09 DIAGNOSIS — I4891 Unspecified atrial fibrillation: Secondary | ICD-10-CM

## 2013-01-09 MED ORDER — NITROGLYCERIN 0.4 MG/SPRAY TL SOLN: 1.0000 | Status: DC | PRN

## 2013-01-09 NOTE — Progress Notes (Signed)
Clinical Summary [name] [name] is a 53 y.o.female last seen in November 2014. Recent ER visit noted at [facility] secondary to chest pain. Troponin I level was negative, ECG showed sinus rhythm with nonspecific ST changes, increased voltage, R' V2 predominately, chest x-ray suggested emphysematous changes but no acute process.  She is here with her CNA. We reviewed her symptoms. Determining clear-cut etiology has been quite frustrating, I was honest with her about this today. Her cardiac workup has actually been quite reassuring over the years including normal coronary arteries in 2012 (Dr. Einar Gip) and a negative stress test done in followup last year. Although the possibility of vasospasm is to be considered, perhaps more likely is endothelial dysfunction, and also what appears to be a definite component of stress and anxiety. It is important to note that she has not been in atrial fibrillation recently in the setting of these symptoms.   Lexiscan Cardiolite in February 2014 showed no diagnostic ST segment abnormalities with evidence of attenuation artifact, no ischemia, LVEF 73%.   Allergies  Allergen Reactions  . Hyoscyamine Sulfate Other (See Comments)    TACHYCARDIA  . Metoclopramide Hcl Other (See Comments)     TREMORS    Current Outpatient Prescriptions  Medication Sig Dispense Refill  . albuterol (PROAIR HFA) 108 (90 BASE) MCG/ACT inhaler Inhale 2 puffs into the lungs every 6 (six) hours as needed.      Marland Kitchen albuterol (PROVENTIL) (2.5 MG/3ML) 0.083% nebulizer solution Take 2.5 mg by nebulization every 6 (six) hours as needed. For shortness of breath.       . baclofen (LIORESAL) 20 MG tablet Take 20 mg by mouth 3 (three) times daily.        Marland Kitchen buPROPion (WELLBUTRIN) 75 MG tablet Take 75 mg by mouth every other day.      . Cyanocobalamin (VITAMIN B-12 IJ) Inject 1 mL as directed every 30 (thirty) days.       . diazepam (VALIUM) 5 MG tablet Take 5 mg by mouth every 8 (eight) hours as  needed. For anxiety.       . diclofenac sodium (VOLTAREN) 1 % GEL Apply 2-4 g topically 4 (four) times daily as needed.       . diltiazem (CARDIZEM CD) 120 MG 24 hr capsule Take 1 capsule (120 mg total) by mouth daily.  30 capsule  6  . esomeprazole (NEXIUM) 40 MG capsule Take 40 mg by mouth daily before breakfast.      . furosemide (LASIX) 80 MG tablet Take 80 mg by mouth 2 (two) times daily.        Marland Kitchen gabapentin (NEURONTIN) 600 MG tablet Take 600 mg by mouth 4 (four) times daily.      . hydrOXYzine (ATARAX/VISTARIL) 25 MG tablet Take 25 mg by mouth 2 (two) times daily as needed.      . isosorbide mononitrate (IMDUR) 120 MG 24 hr tablet Take 120 mg by mouth every morning.       . isosorbide mononitrate (IMDUR) 30 MG 24 hr tablet Take 30 mg by mouth every evening.       . lactulose (CHRONULAC) 10 GM/15ML solution Take 15 mLs by mouth 2 (two) times daily as needed.      Marland Kitchen lisinopril (PRINIVIL,ZESTRIL) 2.5 MG tablet Take 2.5 mg by mouth daily.       Marland Kitchen loratadine (CLARITIN) 10 MG tablet Take 10 mg by mouth daily.        . niacin (NIASPAN) 1000 MG CR  tablet Take 1,000 mg by mouth at bedtime.      . nitroGLYCERIN (NITROLINGUAL) 0.4 MG/SPRAY spray Place 1-2 sprays under the tongue every 5 (five) minutes as needed. Up to 3 doses For chest pain. If no relief after 3rd dose, proceed to ED for evaluation  12 g  3  . NON FORMULARY BIPAP - STARTED ABOUT 1 MONTH AGO.      Marland Kitchen Nystatin (NYAMYC) 100000 UNIT/GM POWD Apply 1 Bottle topically as needed.       . OPANA ER, CRUSH RESISTANT, 20 MG T12A Take 1 tablet by mouth 2 (two) times daily.      Marland Kitchen oxyCODONE-acetaminophen (PERCOCET) 10-325 MG per tablet Take 1 tablet by mouth every 6 (six) hours as needed. For pain.      . potassium chloride SA (K-DUR,KLOR-CON) 20 MEQ tablet Take 40 mEq by mouth 4 (four) times daily.        . Rivaroxaban (XARELTO) 20 MG TABS Take 20 mg by mouth daily.      Marland Kitchen spironolactone (ALDACTONE) 50 MG tablet Take 50 mg by mouth 3 (three) times  daily.        Marland Kitchen amoxicillin (AMOXIL) 500 MG capsule Take 2,000 mg by mouth as directed.      . fluconazole (DIFLUCAN) 150 MG tablet as needed.      Marland Kitchen l-methylfolate-B6-B12 (METANX) 3-35-2 MG TABS Take 1 tablet by mouth daily.         No current facility-administered medications for this visit.    Past Medical History  Diagnosis Date  . Atrial fibrillation     Paroxysmal  . Essential hypertension, benign   . Type 2 diabetes mellitus   . History of cardiac catheterization     Normal coronary arteries 11/12  . Esophageal spasm     NTG and Norvasc  . Fibromyalgia   . Irritable bowel syndrome   . COPD (chronic obstructive pulmonary disease)   . Lymphedema     Social History Ms. Clemence reports that she quit smoking about 6 years ago. Her smoking use included Cigarettes. She has a 45 pack-year smoking history. She has never used smokeless tobacco. Ms. Prokop reports that she does not drink alcohol.  Review of Systems Outlined above.  Physical Examination Filed Vitals:   01/09/13 1358  BP: 122/78  Pulse: 68   Filed Weights   01/09/13 1358  Weight: 288 lb 12.8 oz (130.999 kg)    Obese woman in no acute distress.  HEENT: Conjunctiva and lids normal, oropharynx clear.  Neck: No elevated JVP or carotid bruits.  Lungs: Clear to auscultation, nonlabored breathing at rest.  Cardiac: Regular rate and rhythm, no S3 or significant systolic murmur, no pericardial rub.  Abdomen: Soft, nontender, bowel sounds present.  Extremities: Mild edema, distal pulses 2+.    Problem List and Plan   Precordial pain Recurrent as outlined above. Etiology is not clearly defined, but I suspect endothelial dysfunction and also a component of anxiety and stress. She has not been in atrial fibrillation recently. Ischemic and structural cardiac workups have been reassuring. Plan to continue current medical regimen. We did provide a refill for Nitrolingual spray. I have recommended weight loss and  exercise, also stress reduction.  Atrial fibrillation Paroxysmal, currently in sinus rhythm. She is on Xarelto for stroke prophylaxis and has prior history of cardioversion.    Satira Sark, M.D., F.A.C.C.

## 2013-01-09 NOTE — Assessment & Plan Note (Signed)
Paroxysmal, currently in sinus rhythm. She is on Xarelto for stroke prophylaxis and has prior history of cardioversion.

## 2013-01-09 NOTE — Assessment & Plan Note (Signed)
Recurrent as outlined above. Etiology is not clearly defined, but I suspect endothelial dysfunction and also a component of anxiety and stress. She has not been in atrial fibrillation recently. Ischemic and structural cardiac workups have been reassuring. Plan to continue current medical regimen. We did provide a refill for Nitrolingual spray. I have recommended weight loss and exercise, also stress reduction.

## 2013-01-09 NOTE — Patient Instructions (Signed)
Your physician recommends that you schedule a follow-up appointment in: 4 months. You will receive a reminder letter in the mail in about 2 months reminding you to call and schedule your appointment. If you don't receive this letter, please contact our office. Your physician recommends that you continue on your current medications as directed. Please refer to the Current Medication list given to you today. 

## 2013-01-10 NOTE — ED Provider Notes (Signed)
Medical screening examination/treatment/procedure(s) were conducted as a shared visit with non-physician practitioner(s) and myself.  I personally evaluated the patient during the encounter.  EKG Interpretation    Date/Time:  Monday January 06 2013 19:38:02 EST Ventricular Rate:  69 PR Interval:  170 QRS Duration: 108 QT Interval:  370 QTC Calculation: 396 R Axis:   -26 Text Interpretation:  Normal sinus rhythm Incomplete right bundle branch block Left ventricular hypertrophy with repolarization abnormality When compared with ECG of 23-May-2010 15:32, No significant change was found Confirmed by [facility]  MD, JOHN ([id number]) on 01/06/2013 7:48:20 PM           Screening tests including chest x-ray, EKG, troponin all negative. Low risk for ACS or PE.  Nat Christen, MD 01/10/13 [phone]

## 2013-05-13 ENCOUNTER — Other Ambulatory Visit: Payer: Self-pay | Admitting: Cardiology

## 2013-06-02 ENCOUNTER — Ambulatory Visit: Payer: Medicare Other | Admitting: Gastroenterology

## 2013-06-04 NOTE — Progress Notes (Addendum)
Primary Care Physician:  Glenda Chroman., MD Primary Gastroenterologist:  Dr.   Laurel Dimmer Complaint  Patient presents with  . Irritable Bowel Syndrome    diarrhea to constipation    HPI:   [name] [name] presents today as a self-referral with a history of GERD, gastroparesis, and IBS. Last colonoscopy possibly in 2003 per review of old medical records. Actual op notes not available at time of visit; however, remote office visits report internal hemorrhoids at that time. Goes from constipation to diarrhea. Intermittent low-volume rectal bleeding. Will be constipated for several days then several days of diarrhea, worsens with anxiety/stress/feeling upset. Has a lot of stress currently. Sets it off. Abdominal cramping with diarrhea. Sometimes fecal incontinence. Denies unexplained weight loss or lack of appetite. Chronic nausea, at baseline. No vomiting. Chronic GERD, on Nexium daily. Tries to stay away from triggers that set her off. On Xarelto 20 mg daily for afib. Possible black stool about a month ago. NONE since. Rare Ibuprofen for headaches. No dysphagia. Occasional esophageal spasms.  Past Medical History  Diagnosis Date  . Atrial fibrillation     Paroxysmal  . Essential hypertension, benign   . Type 2 diabetes mellitus   . History of cardiac catheterization     Normal coronary arteries 11/12  . Esophageal spasm     NTG and Norvasc  . Fibromyalgia   . Irritable bowel syndrome   . COPD (chronic obstructive pulmonary disease)   . Lymphedema   . GERD (gastroesophageal reflux disease)   . DDD (degenerative disc disease)   . Osteoarthritis   . Gastroparesis     remote past    Past Surgical History  Procedure Laterality Date  . Back surgery    . Cholecystectomy    . Appendectomy    . Abdominal hysterectomy    . Lung biopsy    . Knee surgery    . Esophagogastroduodenoscopy  10/12/2004    YCX:[id number] plaquing on the esophageal mucosa of uncertain significance, not typical of  what is seen with candida esophagitis status post KOH brushing for KOH prep and biopsy for histology. Rule out candida esophagitis/eosinophilic esophagitis. Otherwise normal esophagus. Tiny hiatal hernia. Otherwise, normal stomach, normal D1 and D2. Benign biopsy of esophagus, unknown KOH status.   . Colonoscopy      Approximately 2003. Per medical records, internal hemorrhoids noted    Current Outpatient Prescriptions  Medication Sig Dispense Refill  . albuterol (PROAIR HFA) 108 (90 BASE) MCG/ACT inhaler Inhale 2 puffs into the lungs every 6 (six) hours as needed.      Marland Kitchen albuterol (PROVENTIL) (2.5 MG/3ML) 0.083% nebulizer solution Take 2.5 mg by nebulization every 6 (six) hours as needed. For shortness of breath.       . baclofen (LIORESAL) 20 MG tablet Take 20 mg by mouth 3 (three) times daily.        Marland Kitchen buPROPion (WELLBUTRIN) 75 MG tablet Take 75 mg by mouth every other day.      . Cyanocobalamin (VITAMIN B-12 IJ) Inject 1 mL as directed every 30 (thirty) days.       . diazepam (VALIUM) 5 MG tablet Take 5 mg by mouth every 12 (twelve) hours as needed. For anxiety.      . diclofenac sodium (VOLTAREN) 1 % GEL Apply 2-4 g topically 4 (four) times daily as needed.       . diltiazem (CARDIZEM CD) 120 MG 24 hr capsule TAKE 1 CAPSULE BY MOUTH EVERY DAY  30  capsule  6  . esomeprazole (NEXIUM) 40 MG capsule Take 40 mg by mouth daily before breakfast.      . furosemide (LASIX) 80 MG tablet Take 80 mg by mouth 2 (two) times daily.        Marland Kitchen gabapentin (NEURONTIN) 600 MG tablet Take 600 mg by mouth 4 (four) times daily.      . hydrOXYzine (ATARAX/VISTARIL) 25 MG tablet Take 25 mg by mouth 2 (two) times daily as needed.      . isosorbide mononitrate (IMDUR) 120 MG 24 hr tablet Take 120 mg by mouth every morning.       . isosorbide mononitrate (IMDUR) 30 MG 24 hr tablet Take 30 mg by mouth every evening.       Marland Kitchen lisinopril (PRINIVIL,ZESTRIL) 2.5 MG tablet Take 2.5 mg by mouth daily.       Marland Kitchen loratadine  (CLARITIN) 10 MG tablet Take 10 mg by mouth daily.        . niacin (NIASPAN) 1000 MG CR tablet Take 1,000 mg by mouth at bedtime.      Marland Kitchen Nystatin (NYAMYC) 100000 UNIT/GM POWD Apply 1 Bottle topically as needed.       . OPANA ER, CRUSH RESISTANT, 20 MG T12A Take 1 tablet by mouth 2 (two) times daily.      Marland Kitchen oxyCODONE-acetaminophen (PERCOCET) 10-325 MG per tablet Take 1 tablet by mouth every 6 (six) hours as needed. For pain.      . potassium chloride SA (K-DUR,KLOR-CON) 20 MEQ tablet Take 40 mEq by mouth 4 (four) times daily.        . Rivaroxaban (XARELTO) 20 MG TABS Take 20 mg by mouth daily.      Marland Kitchen spironolactone (ALDACTONE) 50 MG tablet Take 50 mg by mouth 3 (three) times daily.        Marland Kitchen dicyclomine (BENTYL) 10 MG capsule Take 1 capsule (10 mg total) by mouth 4 (four) times daily -  before meals and at bedtime. For abdominal cramping and loose stools  120 capsule  5  . NON FORMULARY BIPAP - STARTED ABOUT 1 MONTH AGO.      . polyethylene glycol-electrolytes (TRILYTE) 420 G solution Take 4,000 mLs by mouth as directed.  4000 mL  0   No current facility-administered medications for this visit.    Allergies as of 06/05/2013 - Review Complete 06/05/2013  Allergen Reaction Noted  . Hyoscyamine sulfate Other (See Comments)   . Metoclopramide hcl Other (See Comments)     Family History  Problem Relation Age of Onset  . Coronary artery disease Father     Died age 48  . Heart attack Father   . Arrhythmia Father     AF  . Arrhythmia Mother     AF  . Arrhythmia Brother     AF  . Colon cancer Maternal Grandfather   . Colon cancer Paternal Aunt   . Colon cancer Paternal Aunt     History   Social History  . Marital Status: Divorced    Spouse Name: N/A    Number of Children: N/A  . Years of Education: N/A   Occupational History  . Not on file.   Social History Main Topics  . Smoking status: Former Smoker -- 1.50 packs/day for 30 years    Types: Cigarettes    Quit date: 01/03/2007   . Smokeless tobacco: Never Used     Comment: Quit x 6 years  . Alcohol Use: No  . Drug Use:  No  . Sexual Activity: Yes    Birth Control/ Protection: Surgical   Other Topics Concern  . Not on file   Social History Narrative  . No narrative on file    Review of Systems: Gen: Denies any fever, chills, fatigue, weight loss, lack of appetite.  CV: occasional chest discomfort  Resp: +DOE GI: see HPI GU : Denies urinary burning, urinary frequency, urinary hesitancy MS: +joint pain  Derm: Denies rash, itching, dry skin Psych: +stress Heme: Denies bruising, bleeding, and enlarged lymph nodes.  Physical Exam: BP 108/67  Pulse 70  Temp(Src) 97 F (36.1 C) (Oral)  Ht 5\' 9"  (1.753 m)  Wt 298 lb 12.8 oz (135.535 kg)  BMI 44.10 kg/m2 General:   Alert and oriented. Pleasant and cooperative. Well-nourished and well-developed.  Head:  Normocephalic and atraumatic. Eyes:  Without icterus, sclera clear and conjunctiva pink.  Ears:  Normal auditory acuity. Nose:  No deformity, discharge,  or lesions. Mouth:  No deformity or lesions, oral mucosa pink.  Lungs:  Clear to auscultation bilaterally. No wheezes, rales, or rhonchi. No distress.  Heart:  S1, S2 present without murmurs appreciated.  Abdomen:  +BS, soft, non-tender and non-distended. No HSM noted. No guarding or rebound. No masses appreciated.  Rectal:  Deferred  Msk:  Symmetrical without gross deformities. Normal posture. Extremities:  Without clubbing or edema. Neurologic:  Alert and  oriented x4;  grossly normal neurologically. Skin:  Intact without significant lesions or rashes. Psych:  Alert and cooperative. Normal mood and affect.   Outside labs 5/27:  Hgb 12.8, Hct 39.2

## 2013-06-05 ENCOUNTER — Ambulatory Visit (INDEPENDENT_AMBULATORY_CARE_PROVIDER_SITE_OTHER): Payer: Medicare Other | Admitting: Gastroenterology

## 2013-06-05 ENCOUNTER — Encounter: Payer: Self-pay | Admitting: Gastroenterology

## 2013-06-05 VITALS — BP 108/67 | HR 70 | Temp 97.0°F | Ht 69.0 in | Wt 298.8 lb

## 2013-06-05 DIAGNOSIS — K589 Irritable bowel syndrome without diarrhea: Secondary | ICD-10-CM | POA: Insufficient documentation

## 2013-06-05 DIAGNOSIS — Z8719 Personal history of other diseases of the digestive system: Secondary | ICD-10-CM

## 2013-06-05 MED ORDER — PEG 3350-KCL-NA BICARB-NACL 420 G PO SOLR: 4000.0000 mL | ORAL | Status: DC

## 2013-06-05 MED ORDER — DICYCLOMINE HCL 10 MG PO CAPS: 10.0000 mg | ORAL_CAPSULE | Freq: Three times a day (TID) | ORAL | Status: DC

## 2013-06-05 NOTE — Assessment & Plan Note (Addendum)
Possible report of melena about a month ago but none since. On Xarelto for afib. No significant upper GI symptoms to report. Question if this was a true incidence. Will request most recent CBC and plan on possible EGD at time of TCS to evaluate further. Last EGD in 2006 as mentioned in [facility].   Proceed with upper endoscopy in the near future with Dr. Gala Romney. The risks, benefits, and alternatives have been discussed in detail with patient. They have stated understanding and desire to proceed.  Obtain outside CBC Xarelto on hold X 3 days prior

## 2013-06-05 NOTE — Patient Instructions (Signed)
For constipation: take Miralax each evening as needed for constipation.  For diarrhea/loose stool: take Bentyl 1 capsule with meals and at bedtime as needed. Monitor for dry mouth, constipation, drowsiness.   We have scheduled you for a colonoscopy and possible upper endoscopy with Dr. Gala Romney in the near future.

## 2013-06-05 NOTE — Assessment & Plan Note (Signed)
53 year old female with history of IBS, now with fecal incontinence in the setting of intermittent constipation/diarrhea. Intermittent low-volume hematochezia likely benign in this setting. Somewhat overdue for routine screening colonoscopy, last in 2003. Hx of internal hemorrhoids. No first degree relatives with colon cancer but does note several 2nd degree relatives with colon cancer.   Needs TCS in near future for screening purposes; doubt occult issues going on. Will treat symptomatically with Miralax for constipation and Bentyl for loose stool/abdominal cramping.  Proceed with TCS with Dr. Gala Romney in near future: the risks, benefits, and alternatives have been discussed with the patient in detail. The patient states understanding and desires to proceed. ON XARELTO: hold X 3 days. Confirming with cardiology.

## 2013-06-06 ENCOUNTER — Telehealth: Payer: Self-pay

## 2013-06-06 NOTE — Telephone Encounter (Signed)
Dr. Domenic Polite,   your patient was seen in our office for IBS and melena. She needs to have a colonoscopy and EGD but she is on Xarelto and we need to know if she can hold it for 3 days. Her procedures are scheduled for 06/25/13 with Dr. Gala Romney.  Please advise.   Thank you.

## 2013-06-09 NOTE — Telephone Encounter (Signed)
Thank you Dr.McDowell! 

## 2013-06-09 NOTE — Telephone Encounter (Signed)
Okay to hold Xarelto for endoscopic procedures. Generally, Xarelto just needs to be held greater than 24 hours prior to planned procedure.

## 2013-06-09 NOTE — Progress Notes (Signed)
cc'd to pcp 

## 2013-06-09 NOTE — Telephone Encounter (Signed)
Pt is aware that its ok to hold xarelto. [name] [name], pt needs to reschedule for a different date. She forgot she had two office visits that day. Will you reschedule her please.

## 2013-06-09 NOTE — Telephone Encounter (Signed)
She states that she will keep the 24th

## 2013-06-19 ENCOUNTER — Encounter (HOSPITAL_COMMUNITY): Payer: Self-pay | Admitting: Pharmacy Technician

## 2013-06-25 ENCOUNTER — Encounter (HOSPITAL_COMMUNITY)
Admission: RE | Disposition: A | Discharge status: Home / Self Care | Payer: Self-pay | Source: Ambulatory Visit | Attending: Internal Medicine

## 2013-06-25 ENCOUNTER — Encounter (HOSPITAL_COMMUNITY): Payer: Self-pay | Admitting: *Deleted

## 2013-06-25 ENCOUNTER — Ambulatory Visit (HOSPITAL_COMMUNITY)
Admission: RE | Admit: 2013-06-25 | Discharge: 2013-06-25 | Disposition: A | Discharge status: Home / Self Care | Payer: Medicare Other | Source: Ambulatory Visit | Attending: Internal Medicine | Admitting: Internal Medicine

## 2013-06-25 DIAGNOSIS — J449 Chronic obstructive pulmonary disease, unspecified: Secondary | ICD-10-CM | POA: Insufficient documentation

## 2013-06-25 DIAGNOSIS — K589 Irritable bowel syndrome without diarrhea: Secondary | ICD-10-CM

## 2013-06-25 DIAGNOSIS — D126 Benign neoplasm of colon, unspecified: Secondary | ICD-10-CM

## 2013-06-25 DIAGNOSIS — Z87891 Personal history of nicotine dependence: Secondary | ICD-10-CM | POA: Diagnosis not present

## 2013-06-25 DIAGNOSIS — Z8719 Personal history of other diseases of the digestive system: Secondary | ICD-10-CM

## 2013-06-25 DIAGNOSIS — K921 Melena: Secondary | ICD-10-CM

## 2013-06-25 DIAGNOSIS — K2940 Chronic atrophic gastritis without bleeding: Secondary | ICD-10-CM | POA: Insufficient documentation

## 2013-06-25 DIAGNOSIS — I10 Essential (primary) hypertension: Secondary | ICD-10-CM | POA: Insufficient documentation

## 2013-06-25 DIAGNOSIS — E119 Type 2 diabetes mellitus without complications: Secondary | ICD-10-CM | POA: Diagnosis not present

## 2013-06-25 DIAGNOSIS — R897 Abnormal histological findings in specimens from other organs, systems and tissues: Secondary | ICD-10-CM

## 2013-06-25 DIAGNOSIS — K645 Perianal venous thrombosis: Secondary | ICD-10-CM | POA: Insufficient documentation

## 2013-06-25 DIAGNOSIS — Z79899 Other long term (current) drug therapy: Secondary | ICD-10-CM | POA: Insufficient documentation

## 2013-06-25 DIAGNOSIS — J4489 Other specified chronic obstructive pulmonary disease: Secondary | ICD-10-CM | POA: Insufficient documentation

## 2013-06-25 HISTORY — PX: COLONOSCOPY: SHX5424

## 2013-06-25 HISTORY — DX: Urinary tract infection, site not specified: N39.0

## 2013-06-25 HISTORY — PX: ESOPHAGOGASTRODUODENOSCOPY: SHX5428

## 2013-06-25 LAB — GLUCOSE, CAPILLARY: Glucose-Capillary: 125 mg/dL — ABNORMAL HIGH (ref 70–99)

## 2013-06-25 SURGERY — COLONOSCOPY
Anesthesia: Moderate Sedation

## 2013-06-25 MED ORDER — ONDANSETRON HCL 4 MG/2ML IJ SOLN
INTRAMUSCULAR | Status: DC | PRN
  Administered 2013-06-25: 4 mg via INTRAVENOUS

## 2013-06-25 MED ORDER — LIDOCAINE VISCOUS 2 % MT SOLN
OROMUCOSAL | Status: AC
  Filled 2013-06-25: qty 15

## 2013-06-25 MED ORDER — MEPERIDINE HCL 100 MG/ML IJ SOLN
INTRAMUSCULAR | Status: AC
  Filled 2013-06-25: qty 2

## 2013-06-25 MED ORDER — PROMETHAZINE HCL 25 MG/ML IJ SOLN
25.0000 mg | Freq: Once | INTRAMUSCULAR | Status: AC
  Administered 2013-06-25: 25 mg via INTRAVENOUS
  Filled 2013-06-25: qty 1

## 2013-06-25 MED ORDER — LIDOCAINE VISCOUS 2 % MT SOLN
OROMUCOSAL | Status: DC | PRN
  Administered 2013-06-25: 4 mL via OROMUCOSAL

## 2013-06-25 MED ORDER — MIDAZOLAM HCL 5 MG/5ML IJ SOLN
INTRAMUSCULAR | Status: DC | PRN
  Administered 2013-06-25: 2 mg via INTRAVENOUS
  Administered 2013-06-25: 1 mg via INTRAVENOUS
  Administered 2013-06-25: 2 mg via INTRAVENOUS
  Administered 2013-06-25 (×5): 1 mg via INTRAVENOUS

## 2013-06-25 MED ORDER — PROMETHAZINE HCL 25 MG/ML IJ SOLN
INTRAMUSCULAR | Status: AC
  Filled 2013-06-25: qty 1

## 2013-06-25 MED ORDER — MIDAZOLAM HCL 5 MG/5ML IJ SOLN
INTRAMUSCULAR | Status: AC
  Filled 2013-06-25: qty 10

## 2013-06-25 MED ORDER — MEPERIDINE HCL 100 MG/ML IJ SOLN
INTRAMUSCULAR | Status: DC | PRN
  Administered 2013-06-25: 25 mg via INTRAVENOUS
  Administered 2013-06-25: 50 mg via INTRAVENOUS
  Administered 2013-06-25 (×3): 25 mg via INTRAVENOUS

## 2013-06-25 MED ORDER — SODIUM CHLORIDE 0.9 % IJ SOLN
INTRAMUSCULAR | Status: AC
Start: 2013-06-25 — End: 2013-06-25
  Filled 2013-06-25: qty 10

## 2013-06-25 MED ORDER — SODIUM CHLORIDE 0.9 % IV SOLN
INTRAVENOUS | Status: DC
  Administered 2013-06-25: 12:00:00 via INTRAVENOUS

## 2013-06-25 MED ORDER — ONDANSETRON HCL 4 MG/2ML IJ SOLN
INTRAMUSCULAR | Status: AC
  Filled 2013-06-25: qty 2

## 2013-06-25 MED ORDER — STERILE WATER FOR IRRIGATION IR SOLN
Status: DC | PRN
  Administered 2013-06-25: 12:00:00

## 2013-06-25 NOTE — Discharge Instructions (Addendum)
Colonoscopy Discharge Instructions  Read the instructions outlined below and refer to this sheet in the next few weeks. These discharge instructions provide you with general information on caring for yourself after you leave the hospital. Your doctor may also give you specific instructions. While your treatment has been planned according to the most current medical practices available, unavoidable complications occasionally occur. If you have any problems or questions after discharge, call Dr. Gala Romney at [phone]. ACTIVITY  You may resume your regular activity, but move at a slower pace for the next 24 hours.   Take frequent rest periods for the next 24 hours.   Walking will help get rid of the air and reduce the bloated feeling in your belly (abdomen).   No driving for 24 hours (because of the medicine (anesthesia) used during the test).    Do not sign any important legal documents or operate any machinery for 24 hours (because of the anesthesia used during the test).  NUTRITION  Drink plenty of fluids.   You may resume your normal diet as instructed by your doctor.   Begin with a light meal and progress to your normal diet. Heavy or fried foods are harder to digest and may make you feel sick to your stomach (nauseated).   Avoid alcoholic beverages for 24 hours or as instructed.  MEDICATIONS  You may resume your normal medications unless your doctor tells you otherwise.  WHAT YOU CAN EXPECT TODAY  Some feelings of bloating in the abdomen.   Passage of more gas than usual.   Spotting of blood in your stool or on the toilet paper.  IF YOU HAD POLYPS REMOVED DURING THE COLONOSCOPY:  No aspirin products for 7 days or as instructed.   No alcohol for 7 days or as instructed.   Eat a soft diet for the next 24 hours.  FINDING OUT THE RESULTS OF YOUR TEST Not all test results are available during your visit. If your test results are not back during the visit, make an appointment  with your caregiver to find out the results. Do not assume everything is normal if you have not heard from your caregiver or the medical facility. It is important for you to follow up on all of your test results.  SEEK IMMEDIATE MEDICAL ATTENTION IF:  You have more than a spotting of blood in your stool.   Your belly is swollen (abdominal distention).   You are nauseated or vomiting.   You have a temperature over 101.  You have abdominal pain or discomfort that is severe or gets worse throughout the day. EGD Discharge instructions Please read the instructions outlined below and refer to this sheet in the next few weeks. These discharge instructions provide you with general information on caring for yourself after you leave the hospital. Your doctor may also give you specific instructions. While your treatment has been planned according to the most current medical practices available, unavoidable complications occasionally occur. If you have any problems or questions after discharge, please call your doctor. ACTIVITY You may resume your regular activity but move at a slower pace for the next 24 hours.  Take frequent rest periods for the next 24 hours.  Walking will help expel (get rid of) the air and reduce the bloated feeling in your abdomen.  No driving for 24 hours (because of the anesthesia (medicine) used during the test).  You may shower.  Do not sign any important legal documents or operate any machinery for 24  hours (because of the anesthesia used during the test).  NUTRITION Drink plenty of fluids.  You may resume your normal diet.  Begin with a light meal and progress to your normal diet.  Avoid alcoholic beverages for 24 hours or as instructed by your caregiver.  MEDICATIONS You may resume your normal medications unless your caregiver tells you otherwise.  WHAT YOU CAN EXPECT TODAY You may experience abdominal discomfort such as a feeling of fullness or gas pains.   FOLLOW-UP Your doctor will discuss the results of your test with you.  SEEK IMMEDIATE MEDICAL ATTENTION IF ANY OF THE FOLLOWING OCCUR: Excessive nausea (feeling sick to your stomach) and/or vomiting.  Severe abdominal pain and distention (swelling).  Trouble swallowing.  Temperature over 101 F (37.8 C).  Rectal bleeding or vomiting of blood.   Polyp and hemorrhoid information provided  Continue  Nexium daily.  Begin Benefiber 2 teaspoons twice daily  Ten-day course of Anusol suppositories  Resume Xarelto June 27  Further recommendations to follow pending review of pathology report    Hemorrhoids Hemorrhoids are swollen veins around the rectum or anus. There are two types of hemorrhoids:  Internal hemorrhoids. These occur in the veins just inside the rectum. They may poke through to the outside and become irritated and painful. External hemorrhoids. These occur in the veins outside the anus and can be felt as a painful swelling or hard lump near the anus. CAUSES Pregnancy.  Obesity.  Constipation or diarrhea.  Straining to have a bowel movement.  Sitting for long periods on the toilet. Heavy lifting or other activity that caused you to strain. Anal intercourse. SYMPTOMS  Pain.  Anal itching or irritation.  Rectal bleeding.  Fecal leakage.  Anal swelling.  One or more lumps around the anus.  DIAGNOSIS  Your caregiver may be able to diagnose hemorrhoids by visual examination. Other examinations or tests that may be performed include:  Examination of the rectal area with a gloved hand (digital rectal exam).  Examination of anal canal using a small tube (scope).  A blood test if you have lost a significant amount of blood. A test to look inside the colon (sigmoidoscopy or colonoscopy). TREATMENT Most hemorrhoids can be treated at home. However, if symptoms do not seem to be getting better or if you have a lot of rectal bleeding, your caregiver may perform a  procedure to help make the hemorrhoids get smaller or remove them completely. Possible treatments include:  Placing a rubber band at the base of the hemorrhoid to cut off the circulation (rubber band ligation).  Injecting a chemical to shrink the hemorrhoid (sclerotherapy).  Using a tool to burn the hemorrhoid (infrared light therapy).  Surgically removing the hemorrhoid (hemorrhoidectomy).  Stapling the hemorrhoid to block blood flow to the tissue (hemorrhoid stapling).  HOME CARE INSTRUCTIONS  Eat foods with fiber, such as whole grains, beans, nuts, fruits, and vegetables. Ask your doctor about taking products with added fiber in them (fibersupplements). Increase fluid intake. Drink enough water and fluids to keep your urine clear or pale yellow.  Exercise regularly.  Go to the bathroom when you have the urge to have a bowel movement. Do not wait.  Avoid straining to have bowel movements.  Keep the anal area dry and clean. Use wet toilet paper or moist towelettes after a bowel movement.  Medicated creams and suppositories may be used or applied as directed.  Only take over-the-counter or prescription medicines as directed by your caregiver.  Take  warm sitz baths for 15-20 minutes, 3-4 times a day to ease pain and discomfort.  Place ice packs on the hemorrhoids if they are tender and swollen. Using ice packs between sitz baths may be helpful.  Put ice in a plastic bag.  Place a towel between your skin and the bag.  Leave the ice on for 15-20 minutes, 3-4 times a day.  Do not use a donut-shaped pillow or sit on the toilet for long periods. This increases blood pooling and pain.  SEEK MEDICAL CARE IF: You have increasing pain and swelling that is not controlled by treatment or medicine. You have uncontrolled bleeding. You have difficulty or you are unable to have a bowel movement. You have pain or inflammation outside the area of the hemorrhoids. MAKE SURE YOU: Understand  these instructions. Will watch your condition. Will get help right away if you are not doing well or get worse. Document Released: 12/17/1999 Document Revised: 12/06/2011 Document Reviewed: 10/24/2011 [facility] Patient Information 2015 [place], [place]. This information is not intended to replace advice given to you by your health care provider. Make sure you discuss any questions you have with your health care provider. Colon Polyps Polyps are lumps of extra tissue growing inside the body. Polyps can grow in the large intestine (colon). Most colon polyps are noncancerous (benign). However, some colon polyps can become cancerous over time. Polyps that are larger than a pea may be harmful. To be safe, caregivers remove and test all polyps. CAUSES  Polyps form when mutations in the genes cause your cells to grow and divide even though no more tissue is needed. RISK FACTORS There are a number of risk factors that can increase your chances of getting colon polyps. They include: Being older than 50 years. Family history of colon polyps or colon cancer. Long-term colon diseases, such as colitis or Crohn disease. Being overweight. Smoking. Being inactive. Drinking too much alcohol. SYMPTOMS  Most small polyps do not cause symptoms. If symptoms are present, they may include: Blood in the stool. The stool may look dark red or black. Constipation or diarrhea that lasts longer than 1 week. DIAGNOSIS People often do not know they have polyps until their caregiver finds them during a regular checkup. Your caregiver can use 4 tests to check for polyps: Digital rectal exam. The caregiver wears gloves and feels inside the rectum. This test would find polyps only in the rectum. Barium enema. The caregiver puts a liquid called barium into your rectum before taking X-rays of your colon. Barium makes your colon look white. Polyps are dark, so they are easy to see in the X-ray pictures. Sigmoidoscopy. A thin,  flexible tube (sigmoidoscope) is placed into your rectum. The sigmoidoscope has a light and tiny camera in it. The caregiver uses the sigmoidoscope to look at the last third of your colon. Colonoscopy. This test is like sigmoidoscopy, but the caregiver looks at the entire colon. This is the most common method for finding and removing polyps. TREATMENT  Any polyps will be removed during a sigmoidoscopy or colonoscopy. The polyps are then tested for cancer. PREVENTION  To help lower your risk of getting more colon polyps: Eat plenty of fruits and vegetables. Avoid eating fatty foods. Do not smoke. Avoid drinking alcohol. Exercise every day. Lose weight if recommended by your caregiver. Eat plenty of calcium and folate. Foods that are rich in calcium include milk, cheese, and broccoli. Foods that are rich in folate include chickpeas, kidney beans, and spinach.  HOME CARE INSTRUCTIONS Keep all follow-up appointments as directed by your caregiver. You may need periodic exams to check for polyps. SEEK MEDICAL CARE IF: You notice bleeding during a bowel movement. Document Released: 09/15/2003 Document Revised: 03/13/2011 Document Reviewed: 02/28/2011 [facility] Patient Information 2015 [place], [place]. This information is not intended to replace advice given to you by your health care provider. Make sure you discuss any questions you have with your health care provider.

## 2013-06-25 NOTE — H&P (View-Only) (Signed)
Primary Care Physician:  Glenda Chroman., MD Primary Gastroenterologist:  Dr.   Laurel Dimmer Complaint  Patient presents with  . Irritable Bowel Syndrome    diarrhea to constipation    HPI:   [name] [name] presents today as a self-referral with a history of GERD, gastroparesis, and IBS. Last colonoscopy possibly in 2003 per review of old medical records. Actual op notes not available at time of visit; however, remote office visits report internal hemorrhoids at that time. Goes from constipation to diarrhea. Intermittent low-volume rectal bleeding. Will be constipated for several days then several days of diarrhea, worsens with anxiety/stress/feeling upset. Has a lot of stress currently. Sets it off. Abdominal cramping with diarrhea. Sometimes fecal incontinence. Denies unexplained weight loss or lack of appetite. Chronic nausea, at baseline. No vomiting. Chronic GERD, on Nexium daily. Tries to stay away from triggers that set her off. On Xarelto 20 mg daily for afib. Possible black stool about a month ago. NONE since. Rare Ibuprofen for headaches. No dysphagia. Occasional esophageal spasms.  Past Medical History  Diagnosis Date  . Atrial fibrillation     Paroxysmal  . Essential hypertension, benign   . Type 2 diabetes mellitus   . History of cardiac catheterization     Normal coronary arteries 11/12  . Esophageal spasm     NTG and Norvasc  . Fibromyalgia   . Irritable bowel syndrome   . COPD (chronic obstructive pulmonary disease)   . Lymphedema   . GERD (gastroesophageal reflux disease)   . DDD (degenerative disc disease)   . Osteoarthritis   . Gastroparesis     remote past    Past Surgical History  Procedure Laterality Date  . Back surgery    . Cholecystectomy    . Appendectomy    . Abdominal hysterectomy    . Lung biopsy    . Knee surgery    . Esophagogastroduodenoscopy  10/12/2004    YCX:[id number] plaquing on the esophageal mucosa of uncertain significance, not typical of  what is seen with candida esophagitis status post KOH brushing for KOH prep and biopsy for histology. Rule out candida esophagitis/eosinophilic esophagitis. Otherwise normal esophagus. Tiny hiatal hernia. Otherwise, normal stomach, normal D1 and D2. Benign biopsy of esophagus, unknown KOH status.   . Colonoscopy      Approximately 2003. Per medical records, internal hemorrhoids noted    Current Outpatient Prescriptions  Medication Sig Dispense Refill  . albuterol (PROAIR HFA) 108 (90 BASE) MCG/ACT inhaler Inhale 2 puffs into the lungs every 6 (six) hours as needed.      Marland Kitchen albuterol (PROVENTIL) (2.5 MG/3ML) 0.083% nebulizer solution Take 2.5 mg by nebulization every 6 (six) hours as needed. For shortness of breath.       . baclofen (LIORESAL) 20 MG tablet Take 20 mg by mouth 3 (three) times daily.        Marland Kitchen buPROPion (WELLBUTRIN) 75 MG tablet Take 75 mg by mouth every other day.      . Cyanocobalamin (VITAMIN B-12 IJ) Inject 1 mL as directed every 30 (thirty) days.       . diazepam (VALIUM) 5 MG tablet Take 5 mg by mouth every 12 (twelve) hours as needed. For anxiety.      . diclofenac sodium (VOLTAREN) 1 % GEL Apply 2-4 g topically 4 (four) times daily as needed.       . diltiazem (CARDIZEM CD) 120 MG 24 hr capsule TAKE 1 CAPSULE BY MOUTH EVERY DAY  30  capsule  6  . esomeprazole (NEXIUM) 40 MG capsule Take 40 mg by mouth daily before breakfast.      . furosemide (LASIX) 80 MG tablet Take 80 mg by mouth 2 (two) times daily.        Marland Kitchen gabapentin (NEURONTIN) 600 MG tablet Take 600 mg by mouth 4 (four) times daily.      . hydrOXYzine (ATARAX/VISTARIL) 25 MG tablet Take 25 mg by mouth 2 (two) times daily as needed.      . isosorbide mononitrate (IMDUR) 120 MG 24 hr tablet Take 120 mg by mouth every morning.       . isosorbide mononitrate (IMDUR) 30 MG 24 hr tablet Take 30 mg by mouth every evening.       Marland Kitchen lisinopril (PRINIVIL,ZESTRIL) 2.5 MG tablet Take 2.5 mg by mouth daily.       Marland Kitchen loratadine  (CLARITIN) 10 MG tablet Take 10 mg by mouth daily.        . niacin (NIASPAN) 1000 MG CR tablet Take 1,000 mg by mouth at bedtime.      Marland Kitchen Nystatin (NYAMYC) 100000 UNIT/GM POWD Apply 1 Bottle topically as needed.       . OPANA ER, CRUSH RESISTANT, 20 MG T12A Take 1 tablet by mouth 2 (two) times daily.      Marland Kitchen oxyCODONE-acetaminophen (PERCOCET) 10-325 MG per tablet Take 1 tablet by mouth every 6 (six) hours as needed. For pain.      . potassium chloride SA (K-DUR,KLOR-CON) 20 MEQ tablet Take 40 mEq by mouth 4 (four) times daily.        . Rivaroxaban (XARELTO) 20 MG TABS Take 20 mg by mouth daily.      Marland Kitchen spironolactone (ALDACTONE) 50 MG tablet Take 50 mg by mouth 3 (three) times daily.        Marland Kitchen dicyclomine (BENTYL) 10 MG capsule Take 1 capsule (10 mg total) by mouth 4 (four) times daily -  before meals and at bedtime. For abdominal cramping and loose stools  120 capsule  5  . NON FORMULARY BIPAP - STARTED ABOUT 1 MONTH AGO.      . polyethylene glycol-electrolytes (TRILYTE) 420 G solution Take 4,000 mLs by mouth as directed.  4000 mL  0   No current facility-administered medications for this visit.    Allergies as of 06/05/2013 - Review Complete 06/05/2013  Allergen Reaction Noted  . Hyoscyamine sulfate Other (See Comments)   . Metoclopramide hcl Other (See Comments)     Family History  Problem Relation Age of Onset  . Coronary artery disease Father     Died age 48  . Heart attack Father   . Arrhythmia Father     AF  . Arrhythmia Mother     AF  . Arrhythmia Brother     AF  . Colon cancer Maternal Grandfather   . Colon cancer Paternal Aunt   . Colon cancer Paternal Aunt     History   Social History  . Marital Status: Divorced    Spouse Name: N/A    Number of Children: N/A  . Years of Education: N/A   Occupational History  . Not on file.   Social History Main Topics  . Smoking status: Former Smoker -- 1.50 packs/day for 30 years    Types: Cigarettes    Quit date: 01/03/2007   . Smokeless tobacco: Never Used     Comment: Quit x 6 years  . Alcohol Use: No  . Drug Use:  No  . Sexual Activity: Yes    Birth Control/ Protection: Surgical   Other Topics Concern  . Not on file   Social History Narrative  . No narrative on file    Review of Systems: Gen: Denies any fever, chills, fatigue, weight loss, lack of appetite.  CV: occasional chest discomfort  Resp: +DOE GI: see HPI GU : Denies urinary burning, urinary frequency, urinary hesitancy MS: +joint pain  Derm: Denies rash, itching, dry skin Psych: +stress Heme: Denies bruising, bleeding, and enlarged lymph nodes.  Physical Exam: BP 108/67  Pulse 70  Temp(Src) 97 F (36.1 C) (Oral)  Ht 5\' 9"  (1.753 m)  Wt 298 lb 12.8 oz (135.535 kg)  BMI 44.10 kg/m2 General:   Alert and oriented. Pleasant and cooperative. Well-nourished and well-developed.  Head:  Normocephalic and atraumatic. Eyes:  Without icterus, sclera clear and conjunctiva pink.  Ears:  Normal auditory acuity. Nose:  No deformity, discharge,  or lesions. Mouth:  No deformity or lesions, oral mucosa pink.  Lungs:  Clear to auscultation bilaterally. No wheezes, rales, or rhonchi. No distress.  Heart:  S1, S2 present without murmurs appreciated.  Abdomen:  +BS, soft, non-tender and non-distended. No HSM noted. No guarding or rebound. No masses appreciated.  Rectal:  Deferred  Msk:  Symmetrical without gross deformities. Normal posture. Extremities:  Without clubbing or edema. Neurologic:  Alert and  oriented x4;  grossly normal neurologically. Skin:  Intact without significant lesions or rashes. Psych:  Alert and cooperative. Normal mood and affect.

## 2013-06-25 NOTE — Op Note (Addendum)
[facility] [address] [place], [address]   COLONOSCOPY PROCEDURE REPORT  PATIENT: [name] [name], [name] [name]  MR#:         [id number] BIRTHDATE: [birth year] , 49  yrs. old GENDER: Female ENDOSCOPIST: R.  Garfield Cornea, MD FACP FACG REFERRED BY:  Jerene Bears, M.D. PROCEDURE DATE:  06/25/2013 PROCEDURE:     Ileocolonoscopy with snare polypectomy  INDICATIONS: Paper hematochezia  INFORMED CONSENT:  The risks, benefits, alternatives and imponderables including but not limited to bleeding, perforation as well as the possibility of a missed lesion have been reviewed.  The potential for biopsy, lesion removal, etc. have also been discussed.  Questions have been answered.  All parties agreeable. Please see the history and physical in the medical record for more information.  MEDICATIONS: Versed 8 mg IV and Demerol 125 mg IV in divided doses. Phenergan 25 mg IV and Zofran 4 mg IV  DESCRIPTION OF PROCEDURE:  After a digital rectal exam was performed, the EC-[id number] ([id number])  colonoscope was advanced from the anus through the rectum and colon to the area of the cecum, ileocecal valve and appendiceal orifice.  The cecum was deeply intubated.  These structures were well-seen and photographed for the record.  From the level of the cecum and ileocecal valve, the scope was slowly and cautiously withdrawn.  The mucosal surfaces were carefully surveyed utilizing scope tip deflection to facilitate fold flattening as needed.  The scope was pulled down into the rectum where a thorough examination including retroflexion was performed.    FINDINGS:  Adequate preparation. Anal canal hemorrhoids; otherwise, normal rectum. Capacious and redundant colon. (1) 7 mm polyp in the mid sigmoid segment and (1) 8 mm polyp in the mid descending segment; otherwise, the remainder of colonic mucosa appeared normal. The distal 5 cm of terminal ileal mucosa also appeared normal.  THERAPEUTIC /  DIAGNOSTIC MANEUVERS PERFORMED:  The above-mentioned polyps were hot snare removed.  COMPLICATIONS: none  CECAL WITHDRAWAL TIME:  13 minutes  IMPRESSION:  Anal canal hemorrhoids-more likely the source of paper hematochezia. Redundant, capacious colon. Multiple colonic polyps-removed as described above  RECOMMENDATIONS: Begin Benefiber 2 teaspoons twice daily. Ten-day course of Anusol suppositories one per rectum twice a day. Followup on pathology. See EGD report.  Resume Xarelto  June 27   _______________________________ eSigned:  R. Garfield Cornea, MD FACP [facility] 06/25/2013 1:08 PM Revised: 06/25/2013 1:08 PM  CC:    PATIENT NAME:  [name] [name], [name] [name] MR#: [id number]

## 2013-06-25 NOTE — Interval H&P Note (Signed)
History and Physical Interval Note:  06/25/2013 12:10 PM  [name] [name]  has presented today for surgery, with the diagnosis of IBS AND POSS MELENA  The various methods of treatment have been discussed with the patient and family. After consideration of risks, benefits and other options for treatment, the patient has consented to  Procedure(s) with comments: COLONOSCOPY (N/A) - 12:15 ESOPHAGOGASTRODUODENOSCOPY (EGD) (N/A) as a surgical intervention .  The patient's history has been reviewed, patient examined, no change in status, stable for surgery.  I have reviewed the patient's chart and labs.  Questions were answered to the patient's satisfaction.    No change. Patient denies dysphagia. Intermittent dark stool or hemoglobin 14 several days ago. Colonoscopy probable diagnostic EGD to follow per plan. Blood thinners held x3 days.  The risks, benefits, limitations, imponderables and alternatives regarding both EGD and colonoscopy have been reviewed with the patient. Questions have been answered. All parties agreeable.    Manus Rudd

## 2013-06-25 NOTE — Op Note (Signed)
[facility] [address] [place], [address]   ENDOSCOPY PROCEDURE REPORT  PATIENT: [name] [name], [name] [name]  MR#: [id number] BIRTHDATE: 06-Jul-1960 , 82  yrs. old GENDER: Female ENDOSCOPIST: R.  Garfield Cornea, MD FACP FACG REFERRED BY:  Jerene Bears, M.D. PROCEDURE DATE:  06/25/2013 PROCEDURE:     EGD with gastric biopsy  INDICATIONS:    Reported episode of melena; hemoglobin remains normal at 14; no dysphagia  INFORMED CONSENT:   The risks, benefits, limitations, alternatives and imponderables have been discussed.  The potential for biopsy, esophogeal dilation, etc. have also been reviewed.  Questions have been answered.  All parties agreeable.  Please see the history and physical in the medical record for more information.  MEDICATIONS: Versed 10 mg IV and Demerol 150 mg IV. Phenergan 25 mg IV and Zofran 4 mg the. Xylocaine gel orally  DESCRIPTION OF PROCEDURE:   The MO-[id number] ([id number])  endoscope was introduced through the mouth and advanced to the second portion of the duodenum without difficulty or limitations.  The mucosal surfaces were surveyed very carefully during advancement of the scope and upon withdrawal.  Retroflexion view of the proximal stomach and esophagogastric junction was performed.      FINDINGS: Normal esophagus. Stomach empty. "Mosaicism" of the Proximal Gastric Mucosa. No Ulcer or Infiltrating Process. Patent Pylorus. Normal First and Second Portion of the Duodenum  THERAPEUTIC / DIAGNOSTIC MANEUVERS PERFORMED:  Biopsies of abnormal gastric mucosa taken for histologic study   COMPLICATIONS:  None  IMPRESSION:  Abnormal gastric mucosa of uncertain significance next is post gastric biopsy  RECOMMENDATIONS:  Followup on pathology. Further recommendations to follow. See colonoscopy report.    _______________________________ R. Garfield Cornea, MD FACP [facility] eSigned:  R. Garfield Cornea, MD FACP [facility] 06/25/2013 1:04  PM     CC:  PATIENT NAME:  [name] [name], [name] [name] MR#: [id number]

## 2013-06-27 ENCOUNTER — Encounter (HOSPITAL_COMMUNITY): Payer: Self-pay | Admitting: Internal Medicine

## 2013-07-10 ENCOUNTER — Telehealth: Payer: Self-pay | Admitting: Internal Medicine

## 2013-07-10 NOTE — Telephone Encounter (Signed)
Pt had TCS/EGD done a few weeks ago and hasn't heard from the results yet. She is still having diarrhea and the medicine isn't helping her much. Please call [phone]

## 2013-07-11 NOTE — Telephone Encounter (Signed)
Routing to RMR for path results.

## 2013-07-14 ENCOUNTER — Encounter: Payer: Self-pay | Admitting: Internal Medicine

## 2013-07-14 NOTE — Telephone Encounter (Signed)
Letter mailed to pt. LL has cc'd pcp.  Manuela Schwartz, please schedule pt. She would like for you to call her with that appt

## 2013-07-14 NOTE — Telephone Encounter (Signed)
Per RMR- Send letter to patient.  Send copy of letter with path to referring provider and PCP.  Please let patient know of findings as outlined in letter. Patient needs an office visit with extender to go over bowel symptoms.

## 2013-07-15 NOTE — Telephone Encounter (Signed)
Pt is aware of OV on 8/19 at 130 with AS and appt card mailed

## 2013-07-17 ENCOUNTER — Encounter: Payer: Medicare Other | Admitting: Gastroenterology

## 2013-08-06 LAB — CBC
HCT: 39 %
HGB: 12.8 g/dL

## 2013-08-20 ENCOUNTER — Encounter: Payer: Self-pay | Admitting: Gastroenterology

## 2013-08-20 ENCOUNTER — Ambulatory Visit (INDEPENDENT_AMBULATORY_CARE_PROVIDER_SITE_OTHER): Payer: Medicare Other | Admitting: Gastroenterology

## 2013-08-20 VITALS — BP 119/74 | HR 60 | Temp 97.5°F | Ht 69.0 in | Wt 294.4 lb

## 2013-08-20 DIAGNOSIS — K589 Irritable bowel syndrome without diarrhea: Secondary | ICD-10-CM

## 2013-08-20 MED ORDER — HYDROCORTISONE 2.5 % RE CREA: 1.0000 "application " | TOPICAL_CREAM | Freq: Two times a day (BID) | RECTAL | Status: DC

## 2013-08-20 NOTE — Patient Instructions (Signed)
I will need to discuss with Dr. Gala Romney how to manage Xarelto for the hemorrhoid banding.  You may take Miralax once to twice a day as needed for constipation.  Use Bentyl as needed for abdominal discomfort and loose stool.

## 2013-08-20 NOTE — Progress Notes (Signed)
Referring Provider: Glenda Chroman., MD Primary Care Physician:  Glenda Chroman., MD Primary GI: Dr. Gala Romney   Chief Complaint  Patient presents with  . Follow-up    HPI:   [name] [name] presents today in follow-up after TCS/EGD. Seen in June with possible reports of melena and noted low-volume hematochezia. Alternating constipation and diarrhea. Recent flare with hemorrhoids. Couldn't get the anusol suppositories as it was not covered by her insurance. On Xarelto. Bleeding subsided. Now in the constipation mode. Alternates between diarrhea and constipation. Diarrhea usually when stressed and upset. Will take Miralax prn. More constipated than anything. Bentyl works well with loose stool and cramping.   Past Medical History  Diagnosis Date  . Atrial fibrillation     Paroxysmal  . Essential hypertension, benign   . Type 2 diabetes mellitus   . History of cardiac catheterization     Normal coronary arteries 11/12  . Esophageal spasm     NTG and Norvasc  . Fibromyalgia   . Irritable bowel syndrome   . COPD (chronic obstructive pulmonary disease)   . Lymphedema   . GERD (gastroesophageal reflux disease)   . DDD (degenerative disc disease)   . Osteoarthritis   . Gastroparesis     remote past  . Urinary tract infection     Past Surgical History  Procedure Laterality Date  . Back surgery    . Cholecystectomy    . Appendectomy    . Abdominal hysterectomy    . Lung biopsy    . Knee surgery    . Esophagogastroduodenoscopy  10/12/2004    ZOX:WRUE plaquing on the esophageal mucosa of uncertain significance, not typical of what is seen with candida esophagitis status post KOH brushing for KOH prep and biopsy for histology. Rule out candida esophagitis/eosinophilic esophagitis. Otherwise normal esophagus. Tiny hiatal hernia. Otherwise, normal stomach, normal D1 and D2. Benign biopsy of esophagus, unknown KOH status.   . Colonoscopy      Approximately 2003. Per medical records,  internal hemorrhoids noted  . Colonoscopy N/A 06/25/2013    Dr. Gala Romney: Anal canal hemorrhoids-more likely the source of paper hematochezia. Redundant, capacious colon. Multiple colonic polyps-tubular adenoma  . Esophagogastroduodenoscopy N/A 06/25/2013    Dr. Rourk:mild chronic gastritis    Current Outpatient Prescriptions  Medication Sig Dispense Refill  . albuterol (PROAIR HFA) 108 (90 BASE) MCG/ACT inhaler Inhale 2 puffs into the lungs every 6 (six) hours as needed for wheezing or shortness of breath.       Marland Kitchen albuterol (PROVENTIL) (2.5 MG/3ML) 0.083% nebulizer solution Take 2.5 mg by nebulization every 6 (six) hours as needed. For shortness of breath.       . baclofen (LIORESAL) 20 MG tablet Take 20 mg by mouth 3 (three) times daily.        Marland Kitchen buPROPion (WELLBUTRIN) 75 MG tablet Take 75 mg by mouth daily.       . Cyanocobalamin (VITAMIN B-12 IJ) Inject 1 mL as directed every 30 (thirty) days.       . diazepam (VALIUM) 5 MG tablet Take 5 mg by mouth every 12 (twelve) hours as needed. For anxiety.      . dicyclomine (BENTYL) 10 MG capsule Take 1 capsule (10 mg total) by mouth 4 (four) times daily -  before meals and at bedtime. For abdominal cramping and loose stools  120 capsule  5  . diltiazem (CARDIZEM CD) 120 MG 24 hr capsule Take 120 mg by mouth daily.      Marland Kitchen  esomeprazole (NEXIUM) 40 MG capsule Take 40 mg by mouth daily before breakfast.      . furosemide (LASIX) 80 MG tablet Take 80 mg by mouth 2 (two) times daily.        Marland Kitchen gabapentin (NEURONTIN) 600 MG tablet Take 600 mg by mouth 4 (four) times daily.      . hydrOXYzine (ATARAX/VISTARIL) 25 MG tablet Take 25 mg by mouth 2 (two) times daily as needed for anxiety or itching.       . isosorbide mononitrate (IMDUR) 120 MG 24 hr tablet Take 120 mg by mouth every morning.       Marland Kitchen lisinopril (PRINIVIL,ZESTRIL) 2.5 MG tablet Take 2.5 mg by mouth at bedtime.       Marland Kitchen loratadine (CLARITIN) 10 MG tablet Take 10 mg by mouth daily.        . meclizine  (ANTIVERT) 25 MG tablet Take 25 mg by mouth 2 (two) times daily.       . metFORMIN (GLUCOPHAGE) 500 MG tablet Take 500 mg by mouth daily.      . nitroGLYCERIN (NITROLINGUAL) 0.4 MG/SPRAY spray Place 1 spray under the tongue as needed. Chest pain      . NON FORMULARY BIPAP - STARTED ABOUT 1 MONTH AGO.      Marland Kitchen nystatin (MYCOSTATIN/NYSTOP) 100000 UNIT/GM POWD Apply 1 g topically 2 (two) times daily.      . OPANA ER, CRUSH RESISTANT, 20 MG T12A Take 1 tablet by mouth 2 (two) times daily.      Marland Kitchen oxyCODONE-acetaminophen (PERCOCET) 10-325 MG per tablet Take 1 tablet by mouth every 6 (six) hours as needed. For pain.      . potassium chloride SA (K-DUR,KLOR-CON) 20 MEQ tablet Take 40 mEq by mouth 4 (four) times daily.        . Rivaroxaban (XARELTO) 20 MG TABS Take 20 mg by mouth daily.      Marland Kitchen spironolactone (ALDACTONE) 50 MG tablet Take 50 mg by mouth 3 (three) times daily.        . ciprofloxacin (CIPRO) 500 MG tablet Take 500 mg by mouth 2 (two) times daily. Started 06/18/2013 for for 10 days      . diclofenac sodium (VOLTAREN) 1 % GEL Apply 2-4 g topically 4 (four) times daily.       . hydrocortisone (PROCTOSOL HC) 2.5 % rectal cream Place 1 application rectally 2 (two) times daily.  30 g  0  . isosorbide mononitrate (IMDUR) 30 MG 24 hr tablet Take 30 mg by mouth every evening.       . tamsulosin (FLOMAX) 0.4 MG CAPS capsule Take 0.4 mg by mouth daily.       No current facility-administered medications for this visit.    Allergies as of 08/20/2013 - Review Complete 06/25/2013  Allergen Reaction Noted  . Hyoscyamine sulfate Other (See Comments)   . Metoclopramide hcl Other (See Comments)     Family History  Problem Relation Age of Onset  . Coronary artery disease Father     Died age 53  . Heart attack Father   . Arrhythmia Father     AF  . Arrhythmia Mother     AF  . Arrhythmia Brother     AF  . Colon cancer Maternal Grandfather   . Colon cancer Paternal Aunt   . Colon cancer Paternal  Aunt     History   Social History  . Marital Status: Divorced    Spouse Name: N/A  Number of Children: N/A  . Years of Education: N/A   Social History Main Topics  . Smoking status: Former Smoker -- 1.50 packs/day for 30 years    Types: Cigarettes    Quit date: 01/03/2007  . Smokeless tobacco: Never Used     Comment: Quit x 6 years  . Alcohol Use: No  . Drug Use: No  . Sexual Activity: Yes    Birth Control/ Protection: Surgical   Other Topics Concern  . None   Social History Narrative  . None    Review of Systems: As mentioned in HPI  Physical Exam: BP 119/74  Pulse 60  Temp(Src) 97.5 F (36.4 C) (Oral)  Ht 5\' 9"  (1.753 m)  Wt 294 lb 6.4 oz (133.539 kg)  BMI 43.46 kg/m2 General:   Alert and oriented. No distress noted. Pleasant and cooperative.  Head:  Normocephalic and atraumatic. Heart:  S1, S2 present without murmurs, rubs, or gallops. Regular rate and rhythm. Abdomen:  +BS, soft, non-tender and non-distended. No rebound or guarding. No HSM or masses noted. Msk:  Symmetrical without gross deformities. Normal posture. Extremities:  Without edema. Neurologic:  Alert and  oriented x4;  grossly normal neurologically. Skin:  Intact without significant lesions or rashes. Psych:  Alert and cooperative. Normal mood and affect.  Lab Results  Component Value Date   WBC 14.2* 01/06/2013   HGB 12.8 05/28/2013   HCT 39 05/28/2013   MCV 87.3 01/06/2013   PLT 358 01/06/2013

## 2013-08-22 ENCOUNTER — Encounter: Payer: Self-pay | Admitting: Gastroenterology

## 2013-08-22 DIAGNOSIS — K625 Hemorrhage of anus and rectum: Secondary | ICD-10-CM | POA: Insufficient documentation

## 2013-08-22 NOTE — Assessment & Plan Note (Signed)
With alternating constipation and diarrhea. TCS on file with surveillance due in 2020. Internal hemorrhoids noted on colonoscopy with intermittent low-volume hematochezia still noted. Interested in hemorrhoid banding. Somewhat more complicated due to anticoagulation on Xarelto. Will need to review with Dr. Gala Romney first.   Bentyl prn loose stool Miralax nightly as needed for constipation Anusol cream BID X 7 days Discuss with Dr. Gala Romney hemorrhoid banding

## 2013-08-26 NOTE — Progress Notes (Signed)
Cc to PCP 

## 2013-11-06 ENCOUNTER — Ambulatory Visit (INDEPENDENT_AMBULATORY_CARE_PROVIDER_SITE_OTHER): Payer: Medicare Other | Admitting: *Deleted

## 2013-11-06 ENCOUNTER — Encounter: Payer: Self-pay | Admitting: Cardiology

## 2013-11-06 ENCOUNTER — Ambulatory Visit (INDEPENDENT_AMBULATORY_CARE_PROVIDER_SITE_OTHER): Payer: Medicare Other | Admitting: Cardiology

## 2013-11-06 VITALS — BP 103/64 | HR 61 | Ht 69.0 in | Wt 297.0 lb

## 2013-11-06 DIAGNOSIS — Z23 Encounter for immunization: Secondary | ICD-10-CM

## 2013-11-06 DIAGNOSIS — R072 Precordial pain: Secondary | ICD-10-CM

## 2013-11-06 DIAGNOSIS — I480 Paroxysmal atrial fibrillation: Secondary | ICD-10-CM

## 2013-11-06 DIAGNOSIS — I10 Essential (primary) hypertension: Secondary | ICD-10-CM

## 2013-11-06 NOTE — Assessment & Plan Note (Signed)
Maintaining sinus rhythm at this time. She continues on Cardizem CD as well as Xarelto. It has not been clear that her atrial fibrillation is related to her chest pain symptoms.

## 2013-11-06 NOTE — Patient Instructions (Signed)

## 2013-11-06 NOTE — Assessment & Plan Note (Signed)
Recurrent over time. Etiology is not clearly defined, but I suspect endothelial dysfunction and also a component of anxiety and stress. She has had reassuring cardiac testing as outlined above. Plan to continue current medical regimen.

## 2013-11-06 NOTE — Assessment & Plan Note (Signed)
Blood pressure is normal today. No changes were made.

## 2013-11-06 NOTE — Progress Notes (Signed)
Reason for visit: Atrial fibrillation, history of chest pain  Clinical Summary [name] [name] is a 53 y.o.female last seen in January. She presents for a routine visit. She continues to have intermittent chest pain symptoms, uses nitroglycerin spray intermittently.  Her cardiac workup has actually been quite reassuring over the years including normal coronary arteries in 2012 (Dr. Einar Gip) and a negative stress test done in followup last year. Although the possibility of vasospasm is to be considered, perhaps more likely is endothelial dysfunction, and also what appears to be a definite component of stress and anxiety.  Lexiscan Cardiolite in February 2014 showed no diagnostic ST segment abnormalities with evidence of attenuation artifact, no ischemia, LVEF 73%.  She tells me that she was admitted to [facility] back in August with cellulitis. She was under the impression that she had an ECG done at that time showing evidence of an old "infarct." I had the ECG pulled today, reviewing it finding sinus rhythm with borderline left axis deviation, no evidence of previous infarct.  She tells me that she is also nearing a neurological consultation to evaluate for the possibility of "mini strokes versus MS."  Allergies  Allergen Reactions  . Hyoscyamine Sulfate Other (See Comments)    TACHYCARDIA  . Metoclopramide Hcl Other (See Comments)     TREMORS    Current Outpatient Prescriptions  Medication Sig Dispense Refill  . albuterol (PROAIR HFA) 108 (90 BASE) MCG/ACT inhaler Inhale 2 puffs into the lungs every 6 (six) hours as needed for wheezing or shortness of breath.     Marland Kitchen albuterol (PROVENTIL) (2.5 MG/3ML) 0.083% nebulizer solution Take 2.5 mg by nebulization every 6 (six) hours as needed. For shortness of breath.     . baclofen (LIORESAL) 20 MG tablet Take 20 mg by mouth 3 (three) times daily.      Marland Kitchen buPROPion (WELLBUTRIN) 75 MG tablet Take 75 mg by mouth daily.     . Cyanocobalamin (VITAMIN B-12  IJ) Inject 1 mL as directed every 30 (thirty) days.     . diazepam (VALIUM) 5 MG tablet Take 5 mg by mouth every 12 (twelve) hours as needed. For anxiety.    . diclofenac sodium (VOLTAREN) 1 % GEL Apply 2-4 g topically 4 (four) times daily.     Marland Kitchen dicyclomine (BENTYL) 10 MG capsule Take 1 capsule (10 mg total) by mouth 4 (four) times daily -  before meals and at bedtime. For abdominal cramping and loose stools 120 capsule 5  . diltiazem (CARDIZEM CD) 120 MG 24 hr capsule Take 120 mg by mouth daily.    Marland Kitchen esomeprazole (NEXIUM) 40 MG capsule Take 40 mg by mouth daily before breakfast.    . furosemide (LASIX) 80 MG tablet Take 80 mg by mouth 2 (two) times daily.      Marland Kitchen gabapentin (NEURONTIN) 600 MG tablet Take 600 mg by mouth 4 (four) times daily.    . hydrocortisone (PROCTOSOL HC) 2.5 % rectal cream Place 1 application rectally 2 (two) times daily. 30 g 0  . hydrOXYzine (ATARAX/VISTARIL) 25 MG tablet Take 25 mg by mouth 2 (two) times daily as needed for anxiety or itching.     . isosorbide mononitrate (IMDUR) 120 MG 24 hr tablet Take 120 mg by mouth every morning.     . isosorbide mononitrate (IMDUR) 30 MG 24 hr tablet Take 30 mg by mouth every evening.     Marland Kitchen lisinopril (PRINIVIL,ZESTRIL) 2.5 MG tablet Take 2.5 mg by mouth at bedtime.     Marland Kitchen  loratadine (CLARITIN) 10 MG tablet Take 10 mg by mouth daily.      . meclizine (ANTIVERT) 25 MG tablet Take 25 mg by mouth 2 (two) times daily as needed.     . metFORMIN (GLUCOPHAGE) 500 MG tablet Take 500 mg by mouth daily.    . nitroGLYCERIN (NITROLINGUAL) 0.4 MG/SPRAY spray Place 1 spray under the tongue as needed. Chest pain    . NON FORMULARY BIPAP - STARTED ABOUT 1 1/2 years AGO.    Marland Kitchen nystatin (MYCOSTATIN/NYSTOP) 100000 UNIT/GM POWD Apply 1 g topically 2 (two) times daily.    . OPANA ER, CRUSH RESISTANT, 20 MG T12A Take 1 tablet by mouth 2 (two) times daily.    Marland Kitchen oxyCODONE-acetaminophen (PERCOCET) 10-325 MG per tablet Take 1 tablet by mouth every 6 (six)  hours as needed. For pain.    . potassium chloride SA (K-DUR,KLOR-CON) 20 MEQ tablet Take 40 mEq by mouth 4 (four) times daily.      . Rivaroxaban (XARELTO) 20 MG TABS Take 20 mg by mouth daily.    Marland Kitchen spironolactone (ALDACTONE) 50 MG tablet Take 50 mg by mouth 3 (three) times daily.      . tamsulosin (FLOMAX) 0.4 MG CAPS capsule Take 0.4 mg by mouth daily.     No current facility-administered medications for this visit.    Past Medical History  Diagnosis Date  . Atrial fibrillation     Paroxysmal  . Essential hypertension, benign   . Type 2 diabetes mellitus   . History of cardiac catheterization     Normal coronary arteries 11/12  . Esophageal spasm     NTG and Norvasc  . Fibromyalgia   . Irritable bowel syndrome   . COPD (chronic obstructive pulmonary disease)   . Lymphedema   . GERD (gastroesophageal reflux disease)   . DDD (degenerative disc disease)   . Osteoarthritis   . Gastroparesis     remote past  . Urinary tract infection     Social History Ms. Devins reports that she quit smoking about 6 years ago. Her smoking use included Cigarettes. She started smoking about 36 years ago. She has a 45 pack-year smoking history. She has never used smokeless tobacco. Ms. Selvage reports that she does not drink alcohol.  Review of Systems Complete review of systems negative except as otherwise outlined in the clinical summary and also the following.  Physical Examination Filed Vitals:   11/06/13 1107  BP: 103/64  Pulse: 61   Filed Weights   11/06/13 1107  Weight: 297 lb (134.718 kg)    Obese woman in no acute distress.  HEENT: Conjunctiva and lids normal, oropharynx clear.  Neck: No elevated JVP or carotid bruits.  Lungs: Clear to auscultation, nonlabored breathing at rest.  Cardiac: Regular rate and rhythm, no S3 or significant systolic murmur, no pericardial rub.  Abdomen: Soft, nontender, bowel sounds present.  Extremities: Mild edema, distal pulses 2+. Uses  a cane to walk.   Problem List and Plan   Paroxysmal atrial fibrillation Maintaining sinus rhythm at this time. She continues on Cardizem CD as well as Xarelto. It has not been clear that her atrial fibrillation is related to her chest pain symptoms.  Essential hypertension, benign Blood pressure is normal today. No changes were made.  Precordial pain Recurrent over time. Etiology is not clearly defined, but I suspect endothelial dysfunction and also a component of anxiety and stress. She has had reassuring cardiac testing as outlined above. Plan to continue current medical  regimen.     Satira Sark, M.D., F.A.C.C.

## 2013-11-21 ENCOUNTER — Ambulatory Visit (INDEPENDENT_AMBULATORY_CARE_PROVIDER_SITE_OTHER): Payer: Medicare Other | Admitting: Neurology

## 2013-11-21 ENCOUNTER — Encounter: Payer: Self-pay | Admitting: Neurology

## 2013-11-21 VITALS — BP 122/68 | HR 64 | Temp 98.0°F | Resp 16 | Ht 69.0 in | Wt 295.2 lb

## 2013-11-21 DIAGNOSIS — H53133 Sudden visual loss, bilateral: Secondary | ICD-10-CM

## 2013-11-21 DIAGNOSIS — H02401 Unspecified ptosis of right eyelid: Secondary | ICD-10-CM

## 2013-11-21 DIAGNOSIS — R42 Dizziness and giddiness: Secondary | ICD-10-CM

## 2013-11-21 NOTE — Patient Instructions (Addendum)
1.  We will check CTA of the head to look for any blockage or narrowing of arteries [facility]  12/04/13 at 3:45 pm  2.  We will check an EEG to look for any evidence for risk of seizures 3.  We will check a myasthenia panel. 4.  We will follow up afterwards

## 2013-11-21 NOTE — Progress Notes (Signed)
NEUROLOGY CONSULTATION NOTE  [name] [name] MRN: [id number] DOB: 1960-09-02  Referring provider: Dr. Woody Seller Primary care provider: Dr. Woody Seller  Reason for consult:  Episodic dizziness  HISTORY OF PRESENT ILLNESS: [name] [name] is a 53 year old right-handed woman with paroxysmal atrial fibrillation, hypertension, B12 deficiency, type II diabetes mellitus, fibromyalgia, CAD, depression, anxiety, gastroparesis, IBS, and history of spinal stenosis with neuropathy, who presents for episodes of dizziness.  Limited records from PCP reviewed.  She has history of dizziness secondary to medications.  However, she began experiencing different dizzy spells in May.  She would describe it as her "head swimming" and then vision goes black but with no loss of consciousness.  Dizziness is not positional.  It lasts several minutes and occurs daily.  She needs to stay still for it to go away.  She had an MRI of the brain without contrast performed on 07/22/13, which was personally reviewed, and revealed mild small vessel ischemic changes and incidental pituitary adenoma or Rathke's cleft cyst.  She reportedly was diagnosed with the pituitary adenoma many years ago, which had been monitored for several years.  However, she says that the last imaging done a few years ago said that it was no longer present.  In September, she had a slightly different episode.  She was standing and talking to her mom when she developed the dizziness and vision loss but she also felt like she was "in a fog."  She did not lose consciousness but does not remember all the details of the event.  She reportedly was slurring her words and not really making sense and having word-finding difficulties.  It lasted 45 minutes.  However, her tongue felt numb for about a day.  She had similar episode a couple of other times lasting 20-30 minutes, the last one about 2 weeks ago.  There is no associated headache.  She also reports that her right eye  sometimes droops when she gets tired, as well as the left side of her mouth.  Her CNA mentions that she sometimes slurs her speech.  She reports having a work up for MS in the late-90s.  She said she was evaluated at that time for episodes of numbness and tingling.  She was evaluated by a neurologist at [facility], who performed an MRI which was unremarkable except for the pituitary lesion.  She was diagnosed with fibromyalgia.    She has no history of seizures.  She had a Lexiscan Cardiolite in February 2014, which was unremarkable with LVEF of 73%.  She reportedly had an MRI of the brain performed in July which showed a pituitary adenoma versus cleft cyst, however I do not have the scans or report to review.  Her medications include Xarelto, Lisinopril, Imdur, spironolactone, furosemide, metformin, Opana ER, bupropion, gabapentin, cyanocobalamin, baclofen and Percocet.  PAST MEDICAL HISTORY: Past Medical History  Diagnosis Date  . Atrial fibrillation     Paroxysmal  . Essential hypertension, benign   . Type 2 diabetes mellitus   . History of cardiac catheterization     Normal coronary arteries 11/12  . Esophageal spasm     NTG and Norvasc  . Fibromyalgia   . Irritable bowel syndrome   . COPD (chronic obstructive pulmonary disease)   . Lymphedema   . GERD (gastroesophageal reflux disease)   . DDD (degenerative disc disease)   . Osteoarthritis   . Gastroparesis     remote past  . Urinary tract infection     PAST  SURGICAL HISTORY: Past Surgical History  Procedure Laterality Date  . Back surgery    . Cholecystectomy    . Appendectomy    . Abdominal hysterectomy    . Lung biopsy    . Knee surgery    . Esophagogastroduodenoscopy  10/12/2004    WNI:OEVO plaquing on the esophageal mucosa of uncertain significance, not typical of what is seen with candida esophagitis status post KOH brushing for KOH prep and biopsy for histology. Rule out candida esophagitis/eosinophilic esophagitis.  Otherwise normal esophagus. Tiny hiatal hernia. Otherwise, normal stomach, normal D1 and D2. Benign biopsy of esophagus, unknown KOH status.   . Colonoscopy      Approximately 2003. Per medical records, internal hemorrhoids noted  . Colonoscopy N/A 06/25/2013    Dr. Gala Romney: Anal canal hemorrhoids-more likely the source of paper hematochezia. Redundant, capacious colon. Multiple colonic polyps-tubular adenoma  . Esophagogastroduodenoscopy N/A 06/25/2013    Dr. Chelsea Aus chronic gastritis    MEDICATIONS: Current Outpatient Prescriptions on File Prior to Visit  Medication Sig Dispense Refill  . albuterol (PROAIR HFA) 108 (90 BASE) MCG/ACT inhaler Inhale 2 puffs into the lungs every 6 (six) hours as needed for wheezing or shortness of breath.     Marland Kitchen albuterol (PROVENTIL) (2.5 MG/3ML) 0.083% nebulizer solution Take 2.5 mg by nebulization every 6 (six) hours as needed. For shortness of breath.     . baclofen (LIORESAL) 20 MG tablet Take 20 mg by mouth 3 (three) times daily.      Marland Kitchen buPROPion (WELLBUTRIN) 75 MG tablet Take 75 mg by mouth daily.     . Cyanocobalamin (VITAMIN B-12 IJ) Inject 1 mL as directed every 30 (thirty) days.     . diazepam (VALIUM) 5 MG tablet Take 5 mg by mouth every 12 (twelve) hours as needed. For anxiety.    . diclofenac sodium (VOLTAREN) 1 % GEL Apply 2-4 g topically 4 (four) times daily.     Marland Kitchen dicyclomine (BENTYL) 10 MG capsule Take 1 capsule (10 mg total) by mouth 4 (four) times daily -  before meals and at bedtime. For abdominal cramping and loose stools 120 capsule 5  . diltiazem (CARDIZEM CD) 120 MG 24 hr capsule Take 120 mg by mouth daily.    Marland Kitchen esomeprazole (NEXIUM) 40 MG capsule Take 40 mg by mouth daily before breakfast.    . furosemide (LASIX) 80 MG tablet Take 80 mg by mouth 2 (two) times daily.      Marland Kitchen gabapentin (NEURONTIN) 600 MG tablet Take 600 mg by mouth 4 (four) times daily.    . hydrocortisone (PROCTOSOL HC) 2.5 % rectal cream Place 1 application rectally 2  (two) times daily. 30 g 0  . hydrOXYzine (ATARAX/VISTARIL) 25 MG tablet Take 25 mg by mouth 2 (two) times daily as needed for anxiety or itching.     . isosorbide mononitrate (IMDUR) 120 MG 24 hr tablet Take 120 mg by mouth every morning.     . isosorbide mononitrate (IMDUR) 30 MG 24 hr tablet Take 30 mg by mouth every evening.     Marland Kitchen lisinopril (PRINIVIL,ZESTRIL) 2.5 MG tablet Take 2.5 mg by mouth at bedtime.     Marland Kitchen loratadine (CLARITIN) 10 MG tablet Take 10 mg by mouth daily.      . meclizine (ANTIVERT) 25 MG tablet Take 25 mg by mouth 2 (two) times daily as needed.     . metFORMIN (GLUCOPHAGE) 500 MG tablet Take 500 mg by mouth daily.    . nitroGLYCERIN (NITROLINGUAL) 0.4  MG/SPRAY spray Place 1 spray under the tongue as needed. Chest pain    . NON FORMULARY BIPAP - STARTED ABOUT 1 1/2 years AGO.    Marland Kitchen nystatin (MYCOSTATIN/NYSTOP) 100000 UNIT/GM POWD Apply 1 g topically 2 (two) times daily.    . OPANA ER, CRUSH RESISTANT, 20 MG T12A Take 1 tablet by mouth 2 (two) times daily.    Marland Kitchen oxyCODONE-acetaminophen (PERCOCET) 10-325 MG per tablet Take 1 tablet by mouth every 6 (six) hours as needed. For pain.    . potassium chloride SA (K-DUR,KLOR-CON) 20 MEQ tablet Take 40 mEq by mouth 4 (four) times daily.      . Rivaroxaban (XARELTO) 20 MG TABS Take 20 mg by mouth daily.    Marland Kitchen spironolactone (ALDACTONE) 50 MG tablet Take 50 mg by mouth 3 (three) times daily.      . tamsulosin (FLOMAX) 0.4 MG CAPS capsule Take 0.4 mg by mouth daily.     No current facility-administered medications on file prior to visit.    ALLERGIES: Allergies  Allergen Reactions  . Hyoscyamine Sulfate Other (See Comments)    TACHYCARDIA  . Metoclopramide Hcl Other (See Comments)     TREMORS    FAMILY HISTORY: Family History  Problem Relation Age of Onset  . Coronary artery disease Father     Died age 70  . Heart attack Father   . Arrhythmia Father     AF  . Arrhythmia Mother     AF  . Arrhythmia Brother     AF  .  Colon cancer Maternal Grandfather   . Colon cancer Paternal Aunt   . Colon cancer Paternal Aunt   . Stroke Mother   . Dementia Mother   . Cancer Mother     UTERINE   . Diabetes Father   . Diabetes Son   . Cancer Sister     BREAST   . Parkinson's disease Mother   . Parkinson's disease Father     SOCIAL HISTORY: History   Social History  . Marital Status: Divorced    Spouse Name: N/A    Number of Children: N/A  . Years of Education: N/A   Occupational History  . Not on file.   Social History Main Topics  . Smoking status: Former Smoker -- 1.50 packs/day for 30 years    Types: Cigarettes    Start date: 03/06/1977    Quit date: 01/03/2007  . Smokeless tobacco: Never Used     Comment: Quit x 6 years  . Alcohol Use: No  . Drug Use: No  . Sexual Activity: No   Other Topics Concern  . Not on file   Social History Narrative    REVIEW OF SYSTEMS: Constitutional: No fevers, chills, or sweats, no generalized fatigue, change in appetite Eyes: No visual changes, double vision, eye pain Ear, nose and throat: No hearing loss, ear pain, nasal congestion, sore throat Cardiovascular: No chest pain, palpitations Respiratory:  No shortness of breath at rest or with exertion, wheezes GastrointestinaI: No nausea, vomiting, diarrhea, abdominal pain, fecal incontinence Genitourinary:  No dysuria, urinary retention or frequency Musculoskeletal:  No neck pain, back pain Integumentary: No rash, pruritus, skin lesions Neurological: as above Psychiatric: No depression, insomnia, anxiety Endocrine: No palpitations, fatigue, diaphoresis, mood swings, change in appetite, change in weight, increased thirst Hematologic/Lymphatic:  No anemia, purpura, petechiae. Allergic/Immunologic: no itchy/runny eyes, nasal congestion, recent allergic reactions, rashes  PHYSICAL EXAM: Filed Vitals:   11/21/13 0823  BP: 122/68  Pulse: 64  Temp: 98 F (36.7 C)  Resp: 16   General: No acute  distress Head:  Normocephalic/atraumatic Eyes:  fundi not able to be visualized when inspected CN III, IV, VI:  full range of motion, no nystagmus, no ptosis Neck: supple, no paraspinal tenderness, full range of motion Back: No paraspinal tenderness Heart: regular rate and rhythm Lungs: Clear to auscultation bilaterally. Vascular: No carotid bruits. Neurological Exam: Mental status: alert and oriented to person, place, and time, recent and remote memory intact, fund of knowledge intact, attention and concentration intact, speech fluent and not dysarthric, language intact. Cranial nerves: CN I: not tested CN II: pupils equal, round and reactive to light, visual fields intact, fundi not able to be visualized when inspected CN III, IV, VI:  full range of motion, no nystagmus, no ptosis CN V: facial sensation intact CN VII: upper and lower face symmetric CN VIII: hearing intact CN IX, X: gag intact, uvula midline CN XI: sternocleidomastoid and trapezius muscles intact CN XII: tongue midline Bulk & Tone: normal, no fasciculations. Motor:  5/5 throughout Sensation:  Reduced pinprick and vibration in feet. Deep Tendon Reflexes:  Areflexive throughout, toes downgoing. Finger to nose testing:  No dysmetria Gait:  Slight waddling gait. Romberg negative.  IMPRESSION: Dizziness Transient episodes of slurred speech Transient right ptosis  PLAN: 1.  We will check CTA of the head to look for any blockage or narrowing of arteries [facility]  12/04/13 at 3:45 pm  2.  We will check an EEG to look for any evidence for risk of seizures 3.  We will check a myasthenia panel. 4.  We will follow up afterwards  Thank you for allowing me to take part in the care of this patient.  Metta Clines, DO  CC: Jerene Bears, MD

## 2013-11-25 ENCOUNTER — Ambulatory Visit (INDEPENDENT_AMBULATORY_CARE_PROVIDER_SITE_OTHER): Payer: Medicare Other | Admitting: Neurology

## 2013-11-25 DIAGNOSIS — R42 Dizziness and giddiness: Secondary | ICD-10-CM

## 2013-11-25 DIAGNOSIS — H02401 Unspecified ptosis of right eyelid: Secondary | ICD-10-CM

## 2013-11-25 DIAGNOSIS — H53133 Sudden visual loss, bilateral: Secondary | ICD-10-CM

## 2013-11-25 LAB — ACETYLCHOLINE RECEPTOR, BINDING: A CHR BINDING ABS: <0.3 nmol/L (ref <=0.30)

## 2013-11-26 NOTE — Progress Notes (Signed)
If still interested in hemorrhoid banding, she would need to be off of Xarelto 2 days prior to banding PLUS an additional week following the banding. IF she is still interested in this, would need to have clearance from cardiology first to hold Xarelto that long.

## 2013-12-01 ENCOUNTER — Telehealth: Payer: Self-pay | Admitting: *Deleted

## 2013-12-01 ENCOUNTER — Telehealth: Payer: Self-pay | Admitting: Neurology

## 2013-12-01 LAB — STRIATED MUSCLE ANTIBODY

## 2013-12-01 NOTE — Telephone Encounter (Signed)
-----   Message from Dudley Major, DO sent at 12/01/2013 10:20 AM EST ----- Labs are normal. ----- Message -----    From: Lab in [facility] Interface    Sent: 12/01/2013  10:16 AM      To: Dudley Major, DO

## 2013-12-01 NOTE — Telephone Encounter (Signed)
did not leave voice message  regarding normal labs

## 2013-12-01 NOTE — Telephone Encounter (Signed)
Patient is aware of normal labs  

## 2013-12-01 NOTE — Telephone Encounter (Signed)
Pt called/returning your call at 10:45AM. C/B [phone]

## 2013-12-04 ENCOUNTER — Encounter (HOSPITAL_COMMUNITY): Payer: Self-pay

## 2013-12-04 ENCOUNTER — Ambulatory Visit (HOSPITAL_COMMUNITY)
Admission: RE | Admit: 2013-12-04 | Discharge: 2013-12-04 | Disposition: A | Discharge status: Home / Self Care | Payer: Medicare Other | Source: Ambulatory Visit | Attending: Neurology | Admitting: Neurology

## 2013-12-04 DIAGNOSIS — R42 Dizziness and giddiness: Secondary | ICD-10-CM | POA: Diagnosis not present

## 2013-12-04 DIAGNOSIS — H02401 Unspecified ptosis of right eyelid: Secondary | ICD-10-CM

## 2013-12-04 DIAGNOSIS — R4781 Slurred speech: Secondary | ICD-10-CM | POA: Diagnosis not present

## 2013-12-04 DIAGNOSIS — R29810 Facial weakness: Secondary | ICD-10-CM | POA: Diagnosis not present

## 2013-12-04 DIAGNOSIS — H53133 Sudden visual loss, bilateral: Secondary | ICD-10-CM

## 2013-12-04 LAB — POCT I-STAT CREATININE: Creatinine, Ser: 0.6 mg/dL (ref 0.50–1.10)

## 2013-12-04 MED ORDER — IOHEXOL 350 MG/ML SOLN: 100.0000 mL | Freq: Once | INTRAVENOUS | Status: AC | PRN

## 2013-12-05 ENCOUNTER — Telehealth: Payer: Self-pay | Admitting: *Deleted

## 2013-12-05 ENCOUNTER — Other Ambulatory Visit: Payer: Self-pay | Admitting: Cardiology

## 2013-12-05 NOTE — Telephone Encounter (Signed)
-----   Message from Dudley Major, DO sent at 12/05/2013  7:11 AM EST ----- CTA looks okay. ----- Message -----    From: Rad Results In Interface    Sent: 12/04/2013   7:07 PM      To: Dudley Major, DO

## 2013-12-10 ENCOUNTER — Ambulatory Visit: Payer: Medicare Other | Admitting: Neurology

## 2013-12-11 ENCOUNTER — Encounter (HOSPITAL_COMMUNITY): Payer: Self-pay | Admitting: Cardiology

## 2013-12-15 ENCOUNTER — Ambulatory Visit (INDEPENDENT_AMBULATORY_CARE_PROVIDER_SITE_OTHER): Payer: Medicare Other | Admitting: Neurology

## 2013-12-15 ENCOUNTER — Encounter: Payer: Self-pay | Admitting: Neurology

## 2013-12-15 VITALS — BP 122/60 | HR 78 | Temp 98.1°F | Resp 16 | Ht 69.0 in | Wt 288.6 lb

## 2013-12-15 DIAGNOSIS — R42 Dizziness and giddiness: Secondary | ICD-10-CM

## 2013-12-15 DIAGNOSIS — R404 Transient alteration of awareness: Secondary | ICD-10-CM

## 2013-12-15 DIAGNOSIS — Z79899 Other long term (current) drug therapy: Secondary | ICD-10-CM

## 2013-12-15 NOTE — Procedures (Signed)
ELECTROENCEPHALOGRAM REPORT  Date of Study: 11/25/2013  Patient's Name: [name] [name] MRN: [id number] Date of Birth: July 19, 1960  Indication: transient episodes of dizziness, slurred speech and right ptosis  Medications: Gabapentin, valium, baclofen, Percocet, bupropion  Technical Summary: This is a multichannel digital EEG recording, using the international 10-20 placement system.  Spike detection software was employed.  Description: The EEG background is symmetric, with a well-developed posterior dominant rhythm of 8-9 Hz, which is reactive to eye opening and closing.  Diffuse beta activity is seen, with a bilateral frontal preponderance.  No focal or generalized abnormalities are seen.  No focal or generalized epileptiform discharges are seen.  Stage II sleep is seen, with normal and symmetric sleep patterns.  Hyperventilation and photic stimulation were performed, and produced no abnormalities.  ECG revealed normal cardiac rate and rhythm.  Impression: This is a normal routine EEG of the awake and asleep states, with activating procedures.  A normal study does not rule out the possibility of a seizure disorder in this patient.  Adam R. Tomi Likens, DO

## 2013-12-15 NOTE — Progress Notes (Signed)
NEUROLOGY FOLLOW UP OFFICE NOTE  [name] [name] [id number]  HISTORY OF PRESENT ILLNESS: [name] [name] is a 52 year old right-handed woman with paroxysmal atrial fibrillation, hypertension, B12 deficiency, type II diabetes mellitus, fibromyalgia, CAD, depression, anxiety, gastroparesis, IBS, and history of spinal stenosis with neuropathy, who follows up for transient episodes of dizziness, slurred speech and right ptosis.  CTA, labs and EEG reviewed.  UPDATE: EEG was performed 11/25/13 and was normal. CTA of the head was performed on 12/04/13 and was unremarkable, without any basilar stenosis. Myasthenia panel performed 11/21/13 was negative.  Since last visit, she has had two more episodes of "foggy" feeling.  HISTORY: She has history of dizziness secondary to medications.  However, she began experiencing different dizzy spells in May.  She would describe it as her "head swimming" and then vision goes black but with no loss of consciousness.  Dizziness is not positional.  It lasts several minutes and occurs daily.  She needs to stay still for it to go away.  She had an MRI of the brain without contrast performed on 07/22/13, which was personally reviewed, and revealed mild small vessel ischemic changes and incidental pituitary adenoma or Rathke's cleft cyst.  She reportedly was diagnosed with the pituitary adenoma many years ago, which had been monitored for several years.  However, she says that the last imaging done a few years ago said that it was no longer present.  In September, she had a slightly different episode.  She was standing and talking to her mom when she developed the dizziness and vision loss but she also felt like she was "in a fog."  She did not lose consciousness but does not remember all the details of the event.  She reportedly was slurring her words and not really making sense and having word-finding difficulties.  It lasted 45 minutes.  However, her tongue felt numb for  about a day.  She had similar episode a couple of other times lasting 20-30 minutes, the last one about 2 weeks ago.  There is no associated headache.  She also reports that her right eye sometimes droops when she gets tired, as well as the left side of her mouth.  Her CNA mentions that she sometimes slurs her speech.  She reports having a work up for MS in the late-90s.  She said she was evaluated at that time for episodes of numbness and tingling.  She was evaluated by a neurologist at [facility], who performed an MRI which was unremarkable except for the pituitary lesion.  She was diagnosed with fibromyalgia.    She has no history of seizures.  She had a Lexiscan Cardiolite in February 2014, which was unremarkable with LVEF of 73%.  She reportedly had an MRI of the brain performed in July which showed a pituitary adenoma versus cleft cyst, however I do not have the scans or report to review.  Her medications include Xarelto, Lisinopril, Imdur, spironolactone, furosemide, metformin, Opana ER, bupropion, gabapentin, cyanocobalamin, baclofen and Percocet.  PAST MEDICAL HISTORY: Past Medical History  Diagnosis Date  . Atrial fibrillation     Paroxysmal  . Essential hypertension, benign   . Type 2 diabetes mellitus   . History of cardiac catheterization     Normal coronary arteries 11/12  . Esophageal spasm     NTG and Norvasc  . Fibromyalgia   . Irritable bowel syndrome   . COPD (chronic obstructive pulmonary disease)   . Lymphedema   . GERD (gastroesophageal  reflux disease)   . DDD (degenerative disc disease)   . Osteoarthritis   . Gastroparesis     remote past  . Urinary tract infection     MEDICATIONS: Current Outpatient Prescriptions on File Prior to Visit  Medication Sig Dispense Refill  . albuterol (PROAIR HFA) 108 (90 BASE) MCG/ACT inhaler Inhale 2 puffs into the lungs every 6 (six) hours as needed for wheezing or shortness of breath.     Marland Kitchen albuterol (PROVENTIL) (2.5 MG/3ML)  0.083% nebulizer solution Take 2.5 mg by nebulization every 6 (six) hours as needed. For shortness of breath.     Marland Kitchen amoxicillin (AMOXIL) 500 MG capsule     . amoxicillin-clavulanate (AUGMENTIN) 875-125 MG per tablet     . baclofen (LIORESAL) 20 MG tablet Take 20 mg by mouth 3 (three) times daily.      Marland Kitchen buPROPion (WELLBUTRIN) 75 MG tablet Take 75 mg by mouth daily.     . Cyanocobalamin (VITAMIN B-12 IJ) Inject 1 mL as directed every 30 (thirty) days.     . diazepam (VALIUM) 5 MG tablet Take 5 mg by mouth every 12 (twelve) hours as needed. For anxiety.    . diclofenac sodium (VOLTAREN) 1 % GEL Apply 2-4 g topically 4 (four) times daily.     Marland Kitchen dicyclomine (BENTYL) 10 MG capsule Take 1 capsule (10 mg total) by mouth 4 (four) times daily -  before meals and at bedtime. For abdominal cramping and loose stools 120 capsule 5  . diltiazem (CARDIZEM CD) 120 MG 24 hr capsule Take 120 mg by mouth daily.    Marland Kitchen diltiazem (CARDIZEM CD) 120 MG 24 hr capsule TAKE 1 CAPSULE BY MOUTH EVERY DAY. 30 capsule 6  . doxycycline (MONODOX) 100 MG capsule     . esomeprazole (NEXIUM) 40 MG capsule Take 40 mg by mouth daily before breakfast.    . furosemide (LASIX) 80 MG tablet Take 80 mg by mouth 2 (two) times daily.      Marland Kitchen gabapentin (NEURONTIN) 600 MG tablet Take 600 mg by mouth 4 (four) times daily.    . hydrocortisone (PROCTOSOL HC) 2.5 % rectal cream Place 1 application rectally 2 (two) times daily. 30 g 0  . hydrOXYzine (ATARAX/VISTARIL) 25 MG tablet Take 25 mg by mouth 2 (two) times daily as needed for anxiety or itching.     . isosorbide mononitrate (IMDUR) 120 MG 24 hr tablet Take 120 mg by mouth every morning.     . isosorbide mononitrate (IMDUR) 30 MG 24 hr tablet Take 30 mg by mouth every evening.     Marland Kitchen lisinopril (PRINIVIL,ZESTRIL) 2.5 MG tablet Take 2.5 mg by mouth at bedtime.     Marland Kitchen loratadine (CLARITIN) 10 MG tablet Take 10 mg by mouth daily.      . meclizine (ANTIVERT) 25 MG tablet Take 25 mg by mouth 2  (two) times daily as needed.     . metFORMIN (GLUCOPHAGE) 500 MG tablet Take 500 mg by mouth daily.    . nitrofurantoin, macrocrystal-monohydrate, (MACROBID) 100 MG capsule     . nitroGLYCERIN (NITROLINGUAL) 0.4 MG/SPRAY spray Place 1 spray under the tongue as needed. Chest pain    . NON FORMULARY BIPAP - STARTED ABOUT 1 1/2 years AGO.    Marland Kitchen nystatin (MYCOSTATIN/NYSTOP) 100000 UNIT/GM POWD Apply 1 g topically 2 (two) times daily.    . OPANA ER, CRUSH RESISTANT, 20 MG T12A Take 1 tablet by mouth 2 (two) times daily.    Marland Kitchen oxyCODONE-acetaminophen (PERCOCET)  10-325 MG per tablet Take 1 tablet by mouth every 6 (six) hours as needed. For pain.    . polyethylene glycol powder (GLYCOLAX/MIRALAX) powder     . potassium chloride SA (K-DUR,KLOR-CON) 20 MEQ tablet Take 40 mEq by mouth 4 (four) times daily.      . Rivaroxaban (XARELTO) 20 MG TABS Take 20 mg by mouth daily.    Marland Kitchen spironolactone (ALDACTONE) 50 MG tablet Take 50 mg by mouth 3 (three) times daily.      . tamsulosin (FLOMAX) 0.4 MG CAPS capsule Take 0.4 mg by mouth daily.     No current facility-administered medications on file prior to visit.    ALLERGIES: Allergies  Allergen Reactions  . Hyoscyamine Sulfate Other (See Comments)    TACHYCARDIA  . Metoclopramide Hcl Other (See Comments)     TREMORS    FAMILY HISTORY: Family History  Problem Relation Age of Onset  . Coronary artery disease Father     Died age 69  . Heart attack Father   . Arrhythmia Father     AF  . Arrhythmia Mother     AF  . Arrhythmia Brother     AF  . Colon cancer Maternal Grandfather   . Colon cancer Paternal Aunt   . Colon cancer Paternal Aunt   . Stroke Mother   . Dementia Mother   . Cancer Mother     UTERINE   . Diabetes Father   . Diabetes Son   . Cancer Sister     BREAST   . Parkinson's disease Mother   . Parkinson's disease Father     SOCIAL HISTORY: History   Social History  . Marital Status: Divorced    Spouse Name: N/A    Number of  Children: N/A  . Years of Education: N/A   Occupational History  . Not on file.   Social History Main Topics  . Smoking status: Former Smoker -- 1.50 packs/day for 30 years    Types: Cigarettes    Start date: 03/06/1977    Quit date: 01/03/2007  . Smokeless tobacco: Never Used     Comment: Quit x 6 years  . Alcohol Use: No  . Drug Use: No  . Sexual Activity: No   Other Topics Concern  . Not on file   Social History Narrative    REVIEW OF SYSTEMS: Constitutional: No fevers, chills, or sweats, no generalized fatigue, change in appetite Eyes: No visual changes, double vision, eye pain Ear, nose and throat: No hearing loss, ear pain, nasal congestion, sore throat Cardiovascular: No chest pain, palpitations Respiratory:  No shortness of breath at rest or with exertion, wheezes GastrointestinaI: No nausea, vomiting, diarrhea, abdominal pain, fecal incontinence Genitourinary:  No dysuria, urinary retention or frequency Musculoskeletal:  No neck pain, back pain Integumentary: No rash, pruritus, skin lesions Neurological: as above Psychiatric: No depression, insomnia, anxiety Endocrine: No palpitations, fatigue, diaphoresis, mood swings, change in appetite, change in weight, increased thirst Hematologic/Lymphatic:  No anemia, purpura, petechiae. Allergic/Immunologic: no itchy/runny eyes, nasal congestion, recent allergic reactions, rashes  PHYSICAL EXAM: Filed Vitals:   12/15/13 1359  BP: 122/60  Pulse: 78  Temp: 98.1 F (36.7 C)  Resp: 16   General: No acute distress Head:  Normocephalic/atraumatic  IMPRESSION: Episodic dizziness Transient altered sensorium Polypharmacy Morbid obesity  I have no explanation for her recurrent symptoms.  They really are not typical for seizure or migraine.  They really do not seem like TIAs either, especially since these are  recurrent habitual spells.  All I can say is that we ruled out potentially dangerous causes, such as basilar  stenosis.  I do believe they are related to polypharmacy, despite taking these medications for many years.  PLAN: Asked her to discuss with pain specialist about decreasing some of her medications. Weight loss.  Follow up as needed. 15 minutes spent with patient, 100% spent discussing differential diagnoses and coordinating care. Metta Clines, DO  CC: Jerene Bears, MD

## 2013-12-15 NOTE — Patient Instructions (Signed)
I don't have an explanation for your spells but at least we ruled out serious causes.  Still consider medication side effects.  Please call with any questions or concerns.

## 2013-12-19 NOTE — Progress Notes (Signed)
Tried to call pt- LMOM asked her to call back and let us know if she is interested in a banding.

## 2014-02-05 ENCOUNTER — Telehealth: Payer: Self-pay | Admitting: *Deleted

## 2014-02-05 NOTE — Telephone Encounter (Signed)
Home health nurse called to report that patient is having palpitations and is requesting an appointment. Also home health nurse is asking if patient should go back on xarelto. Patient was admitted to [facility] after Christmas for urinary bladder bleeding after giving herself a cath and caused trauma to bladder that resulted in bleeding. Patient was d/c on 12/30/14 and her xarelto was placed on hold. Patient was referred to [facility] at that time but is being d/c from their services today. Patient is still giving herself caths at home. All cardiac medications were reviewed while on phone with home health nurse. Please advise if this patient can be added to see MD on 01/09/14 at the [place] office.

## 2014-02-05 NOTE — Telephone Encounter (Signed)
Called and left message on voicemail with appointment information.

## 2014-02-05 NOTE — Telephone Encounter (Signed)
Can place on [place] schedule on February 8. Please pick afternoon if possible.

## 2014-02-09 ENCOUNTER — Ambulatory Visit: Payer: Medicare Other | Admitting: Cardiology

## 2014-02-20 ENCOUNTER — Ambulatory Visit (INDEPENDENT_AMBULATORY_CARE_PROVIDER_SITE_OTHER): Payer: Medicare Other | Admitting: Cardiology

## 2014-02-20 ENCOUNTER — Encounter: Payer: Self-pay | Admitting: Cardiology

## 2014-02-20 VITALS — BP 105/67 | HR 76 | Ht 69.0 in | Wt 288.4 lb

## 2014-02-20 DIAGNOSIS — I10 Essential (primary) hypertension: Secondary | ICD-10-CM

## 2014-02-20 DIAGNOSIS — I481 Persistent atrial fibrillation: Secondary | ICD-10-CM

## 2014-02-20 DIAGNOSIS — E1140 Type 2 diabetes mellitus with diabetic neuropathy, unspecified: Secondary | ICD-10-CM

## 2014-02-20 DIAGNOSIS — R002 Palpitations: Secondary | ICD-10-CM

## 2014-02-20 DIAGNOSIS — I4819 Other persistent atrial fibrillation: Secondary | ICD-10-CM

## 2014-02-20 MED ORDER — DILTIAZEM HCL ER COATED BEADS 180 MG PO CP24: 180.0000 mg | ORAL_CAPSULE | Freq: Every day | ORAL | Status: DC

## 2014-02-20 MED ORDER — FLECAINIDE ACETATE 50 MG PO TABS: 50.0000 mg | ORAL_TABLET | Freq: Two times a day (BID) | ORAL | Status: DC

## 2014-02-20 NOTE — Progress Notes (Signed)
Cardiology Office Note  Date: 02/20/2014   ID: [name] [name], DOB 27-Apr-1960, MRN [id number]  PCP: Glenda Chroman., MD  Primary Cardiologist: Rozann Lesches, MD   Chief Complaint  Patient presents with  . PAF  . Hypertension    History of Present Illness: [name] [name] is a 54 y.o. female last seen in November 2015. Interval records reviewed finding hospitalization at [facility] in December with hematuria associated with instrumentation of the bladder via self-catheterization, patient also on Xarelto. Anticoagulation was held at that time and outpatient urology follow-up was arranged by Dr. Woody Seller. She also had home health nursing temporarily. She tells me that she was found to have an enlarged bladder and may need further intervention down the road, continues to have to self catheterize. She has been placed back on Xarelto by Dr. Woody Seller, approximately one week ago. No recurring bleeding at this time.  She presents today stating that she has sensed palpitations since early January, potentially has been back in atrial fibrillation over this time. ECG today confirms rate-controlled atrial fibrillation. Her tracing from hospitalization at [facility] showed sinus rhythm in December 2015.       We discussed atrial fibrillation management strategies, she has had cardioversion in the past, and actually maintained sinus rhythm for a reasonable period. She is symptomatic with her atrial fibrillation, and we discussed initiating antiarrhythmic therapy in the form of flecainide, potentially considering a repeat cardioversion if she does not convert in the interim.                        Her cardiac workup has been quite reassuring over the years including normal coronary arteries in 2012 (Dr. Einar Gip) and a negative stress test done in followup 2014. Although the possibility of vasospasm is to be considered, perhaps more likely is endothelial dysfunction, and also what appears to be a definite component of  stress and anxiety.   Past Medical History  Diagnosis Date  . Atrial fibrillation     Paroxysmal  . Essential hypertension, benign   . Type 2 diabetes mellitus   . History of cardiac catheterization     Normal coronary arteries 11/12  . Esophageal spasm     NTG and Norvasc  . Fibromyalgia   . Irritable bowel syndrome   . COPD (chronic obstructive pulmonary disease)   . Lymphedema   . GERD (gastroesophageal reflux disease)   . DDD (degenerative disc disease)   . Osteoarthritis   . Gastroparesis     remote past  . Urinary tract infection     Past Surgical History  Procedure Laterality Date  . Back surgery    . Cholecystectomy    . Appendectomy    . Abdominal hysterectomy    . Lung biopsy    . Knee surgery    . Esophagogastroduodenoscopy  10/12/2004    XIH:WTUU plaquing on the esophageal mucosa of uncertain significance, not typical of what is seen with candida esophagitis status post KOH brushing for KOH prep and biopsy for histology. Rule out candida esophagitis/eosinophilic esophagitis. Otherwise normal esophagus. Tiny hiatal hernia. Otherwise, normal stomach, normal D1 and D2. Benign biopsy of esophagus, unknown KOH status.   . Colonoscopy      Approximately 2003. Per medical records, internal hemorrhoids noted  . Colonoscopy N/A 06/25/2013    Dr. Gala Romney: Anal canal hemorrhoids-more likely the source of paper hematochezia. Redundant, capacious colon. Multiple colonic polyps-tubular adenoma  . Esophagogastroduodenoscopy N/A 06/25/2013  Dr. Rourk:mild chronic gastritis  . Left heart catheterization with coronary angiogram N/A 11/15/2010    Procedure: LEFT HEART CATHETERIZATION WITH CORONARY ANGIOGRAM;  Surgeon: Laverda Page, MD;  Location: [facility] CATH LAB;  Service: Cardiovascular;  Laterality: N/A;    Current Outpatient Prescriptions  Medication Sig Dispense Refill  . albuterol (PROAIR HFA) 108 (90 BASE) MCG/ACT inhaler Inhale 2 puffs into the lungs every 6 (six) hours  as needed for wheezing or shortness of breath.     Marland Kitchen albuterol (PROVENTIL) (2.5 MG/3ML) 0.083% nebulizer solution Take 2.5 mg by nebulization every 6 (six) hours as needed. For shortness of breath.     Marland Kitchen amoxicillin (AMOXIL) 500 MG capsule As needed prior to dental work    . baclofen (LIORESAL) 20 MG tablet Take 20 mg by mouth 3 (three) times daily.      Marland Kitchen buPROPion (WELLBUTRIN) 75 MG tablet Take 75 mg by mouth daily.     . diazepam (VALIUM) 5 MG tablet Take 5 mg by mouth every 12 (twelve) hours as needed. For anxiety.    . diclofenac sodium (VOLTAREN) 1 % GEL Apply 2-4 g topically 4 (four) times daily.     Marland Kitchen dicyclomine (BENTYL) 10 MG capsule Take 1 capsule (10 mg total) by mouth 4 (four) times daily -  before meals and at bedtime. For abdominal cramping and loose stools 120 capsule 5  . esomeprazole (NEXIUM) 40 MG capsule Take 40 mg by mouth daily before breakfast.    . fluconazole (DIFLUCAN) 100 MG tablet as needed.     . furosemide (LASIX) 80 MG tablet Take 80 mg by mouth 2 (two) times daily.      Marland Kitchen gabapentin (NEURONTIN) 600 MG tablet Take 600 mg by mouth 4 (four) times daily.    . hydrocortisone (PROCTOSOL HC) 2.5 % rectal cream Place 1 application rectally 2 (two) times daily. 30 g 0  . hydrOXYzine (ATARAX/VISTARIL) 25 MG tablet Take 25 mg by mouth 2 (two) times daily as needed for anxiety or itching.     . isosorbide mononitrate (IMDUR) 120 MG 24 hr tablet Take 120 mg by mouth every morning.     . isosorbide mononitrate (IMDUR) 30 MG 24 hr tablet Take 30 mg by mouth every evening.     Marland Kitchen lisinopril (PRINIVIL,ZESTRIL) 2.5 MG tablet Take 2.5 mg by mouth at bedtime.     Marland Kitchen loratadine (CLARITIN) 10 MG tablet Take 10 mg by mouth daily.      . meclizine (ANTIVERT) 25 MG tablet Take 25 mg by mouth 2 (two) times daily as needed.     . metFORMIN (GLUCOPHAGE) 500 MG tablet Take 500 mg by mouth daily.    . nitroGLYCERIN (NITROLINGUAL) 0.4 MG/SPRAY spray Place 1 spray under the tongue as needed. Chest  pain    . NON FORMULARY BIPAP - STARTED ABOUT 1 1/2 years AGO.    Marland Kitchen nystatin (MYCOSTATIN/NYSTOP) 100000 UNIT/GM POWD Apply 1 g topically 2 (two) times daily.    . OPANA ER, CRUSH RESISTANT, 20 MG T12A Take 1 tablet by mouth 2 (two) times daily.    Marland Kitchen oxyCODONE-acetaminophen (PERCOCET) 10-325 MG per tablet Take 1 tablet by mouth every 6 (six) hours as needed. For pain.    . polyethylene glycol powder (GLYCOLAX/MIRALAX) powder as needed.     . potassium chloride SA (K-DUR,KLOR-CON) 20 MEQ tablet Take 40 mEq by mouth 4 (four) times daily.      . Rivaroxaban (XARELTO) 20 MG TABS Take 20 mg by mouth  daily.    . spironolactone (ALDACTONE) 50 MG tablet Take 50 mg by mouth 3 (three) times daily.      . tamsulosin (FLOMAX) 0.4 MG CAPS capsule Take 0.4 mg by mouth daily.    Marland Kitchen diltiazem (CARDIZEM CD) 180 MG 24 hr capsule Take 1 capsule (180 mg total) by mouth daily. 90 capsule 3  . flecainide (TAMBOCOR) 50 MG tablet Take 1 tablet (50 mg total) by mouth 2 (two) times daily. 180 tablet 3   No current facility-administered medications for this visit.    Allergies:  Hyoscyamine sulfate and Metoclopramide hcl   Social History: The patient  reports that she quit smoking about 7 years ago. Her smoking use included Cigarettes. She started smoking about 36 years ago. She has a 45 pack-year smoking history. She has never used smokeless tobacco. She reports that she does not drink alcohol or use illicit drugs.   ROS:  Please see the history of present illness. Otherwise, complete review of systems is positive for difficulty passing urine, arthritic pains.  All other systems are reviewed and negative.    Physical Exam: VS:  BP 105/67 mmHg  Pulse 76  Ht 5\' 9"  (1.753 m)  Wt 288 lb 6.4 oz (130.817 kg)  BMI 42.57 kg/m2  SpO2 99%, BMI Body mass index is 42.57 kg/(m^2).  Wt Readings from Last 3 Encounters:  02/20/14 288 lb 6.4 oz (130.817 kg)  12/15/13 288 lb 9.6 oz (130.908 kg)  11/21/13 295 lb 3.2 oz (133.902  kg)     Obese woman in no acute distress.  HEENT: Conjunctiva and lids normal, oropharynx clear.  Neck: No elevated JVP or carotid bruits.  Lungs: Clear to auscultation, nonlabored breathing at rest.  Cardiac: Irregularly irregular, no S3 or significant systolic murmur, no pericardial rub.  Abdomen: Soft, nontender, bowel sounds present.  Extremities: Mild edema, distal pulses 2+. Uses a cane to walk. Skin: Warm and dry. Musculoskeletal: No kyphosis. Neuropsychiatric: Alert and oriented 3, affect appropriate.   ECG: ECG is ordered today and reviewed showing rate-controlled atrial fibrillation.   Recent Labwork:  Lab work from December 2015 showed BUN 16, creatinine 0.6, potassium 3.7, INR 1.1, hemoglobin 11.9, platelets 359.  Other Studies Reviewed Today:  ECG from 12/25/2013 showed normal sinus rhythm with left axis deviation, high lateral Q waves.  Assessment and Plan:  1. Persistent atrial fibrillation, possibly recurrence sometime in January based on symptoms. She reports rapid heart rate with activity and symptomatic palpitations but no chest pain. After discussing potential options for treatment, plan is to initiate flecainide 50 mg twice daily and also increase Cardizem CD to 180 mg daily. Agree with reinstitution of Xarelto as per Dr. Woody Seller for stroke prophylaxis. If we have to pursue electrical cardioversion she will need to be anticoagulated.  2. Essential hypertension, blood pressure low normal. I reviewed her medications. She is on Cardizem CD, Imdur, Aldactone, and lisinopril. In discussing it with her, it sounds like the ACE inhibitor was added for renal protection with diabetes rather than for further blood pressure control. Need to keep an eye on blood pressure with increased dose Cardizem CD.  3. Type 2 diabetes mellitus, followed by Dr. Woody Seller.  Current medicines are reviewed at length with the patient today.  The patient does not have concerns regarding  medicines.   Orders Placed This Encounter  Procedures  . EKG 12-Lead    Disposition: FU with me in 1 month.   Signed, Satira Sark, MD, [name] 02/20/2014 9:47  [facility] [place], [place], [place] [address] Phone: [phone]; Fax: [phone]

## 2014-02-20 NOTE — Patient Instructions (Signed)
Your physician recommends that you schedule a follow-up appointment in: 1 month with Dr. Domenic Polite  Your physician has recommended you make the following change in your medication:   INCREASE CARDIZEM 180 MG DAILY  START FLECAINIDE 50 MG TWICE DAILY  CONTINUE ALL OTHER MEDICATIONS AS DIRECTED  Thank you for choosing [facility]!!

## 2014-03-20 ENCOUNTER — Telehealth: Payer: Self-pay | Admitting: Cardiology

## 2014-03-20 ENCOUNTER — Encounter: Payer: Self-pay | Admitting: Cardiology

## 2014-03-20 ENCOUNTER — Other Ambulatory Visit: Payer: Self-pay | Admitting: Cardiology

## 2014-03-20 ENCOUNTER — Encounter: Payer: Self-pay | Admitting: *Deleted

## 2014-03-20 ENCOUNTER — Ambulatory Visit (INDEPENDENT_AMBULATORY_CARE_PROVIDER_SITE_OTHER): Payer: Medicare Other | Admitting: Cardiology

## 2014-03-20 VITALS — BP 108/71 | HR 69 | Ht 69.0 in | Wt 288.1 lb

## 2014-03-20 DIAGNOSIS — I10 Essential (primary) hypertension: Secondary | ICD-10-CM | POA: Diagnosis not present

## 2014-03-20 DIAGNOSIS — I481 Persistent atrial fibrillation: Secondary | ICD-10-CM | POA: Diagnosis not present

## 2014-03-20 DIAGNOSIS — I4819 Other persistent atrial fibrillation: Secondary | ICD-10-CM

## 2014-03-20 DIAGNOSIS — E119 Type 2 diabetes mellitus without complications: Secondary | ICD-10-CM

## 2014-03-20 DIAGNOSIS — I480 Paroxysmal atrial fibrillation: Secondary | ICD-10-CM

## 2014-03-20 NOTE — Addendum Note (Signed)
Addended by: Merlene Laughter on: 03/20/2014 01:46 PM   Modules accepted: Level of Service

## 2014-03-20 NOTE — Telephone Encounter (Signed)
No precert required 

## 2014-03-20 NOTE — Progress Notes (Signed)
Cardiology Office Note  Date: 03/20/2014   ID: [name], DOB 11-Aug-1960, MRN [id number]  PCP: Glenda Chroman., MD  Primary Cardiologist: Rozann Lesches, MD   Chief Complaint  Patient presents with  . Atrial Fibrillation    History of Present Illness: [name] [name] is a 54 y.o. female seen recently in February with documented persistent atrial fibrillation. In addition to baseline anticoagulation with the Xarelto, she was started on flecainide and had her Cardizem CD dose increased. She comes in for a follow-up visit today.  Unfortunately, she remains in atrial fibrillation, symptomatic in terms of dyspnea on exertion and palpitations, but her heart rate does look better controlled. Follow-up ECG as noted below.  We discussed arranging an outpatient DCCV to restore sinus rhythm, and will plan to leave her on flecainide in hopes that this will help maintain sinus rhythm. We are scheduling this for next week in [place] with Dr Bronson Ing will be covering the hospital at that time. She has been on uninterrupted Xarelto for greater than a month.   Past Medical History  Diagnosis Date  . Atrial fibrillation     Paroxysmal  . Essential hypertension, benign   . Type 2 diabetes mellitus   . History of cardiac catheterization     Normal coronary arteries 11/12  . Esophageal spasm     NTG and Norvasc  . Fibromyalgia   . Irritable bowel syndrome   . COPD (chronic obstructive pulmonary disease)   . Lymphedema   . GERD (gastroesophageal reflux disease)   . DDD (degenerative disc disease)   . Osteoarthritis   . Gastroparesis     remote past  . Urinary tract infection     Past Surgical History  Procedure Laterality Date  . Back surgery    . Cholecystectomy    . Appendectomy    . Abdominal hysterectomy    . Lung biopsy    . Knee surgery    . Esophagogastroduodenoscopy  10/12/2004    VEL:FYBO plaquing on the esophageal mucosa of uncertain significance, not typical of  what is seen with candida esophagitis status post KOH brushing for KOH prep and biopsy for histology. Rule out candida esophagitis/eosinophilic esophagitis. Otherwise normal esophagus. Tiny hiatal hernia. Otherwise, normal stomach, normal D1 and D2. Benign biopsy of esophagus, unknown KOH status.   . Colonoscopy      Approximately 2003. Per medical records, internal hemorrhoids noted  . Colonoscopy N/A 06/25/2013    Dr. Gala Romney: Anal canal hemorrhoids-more likely the source of paper hematochezia. Redundant, capacious colon. Multiple colonic polyps-tubular adenoma  . Esophagogastroduodenoscopy N/A 06/25/2013    Dr. Rourk:mild chronic gastritis  . Left heart catheterization with coronary angiogram N/A 11/15/2010    Procedure: LEFT HEART CATHETERIZATION WITH CORONARY ANGIOGRAM;  Surgeon: Laverda Page, MD;  Location: [facility] CATH LAB;  Service: Cardiovascular;  Laterality: N/A;    Current Outpatient Prescriptions  Medication Sig Dispense Refill  . albuterol (PROAIR HFA) 108 (90 BASE) MCG/ACT inhaler Inhale 2 puffs into the lungs every 6 (six) hours as needed for wheezing or shortness of breath.     Marland Kitchen albuterol (PROVENTIL) (2.5 MG/3ML) 0.083% nebulizer solution Take 2.5 mg by nebulization every 6 (six) hours as needed. For shortness of breath.     Marland Kitchen amoxicillin (AMOXIL) 500 MG capsule As needed prior to dental work    . baclofen (LIORESAL) 20 MG tablet Take 20 mg by mouth 3 (three) times daily.      Marland Kitchen buPROPion (WELLBUTRIN) 75 MG  tablet Take 75 mg by mouth daily.     . diazepam (VALIUM) 5 MG tablet Take 5 mg by mouth every 12 (twelve) hours as needed. For anxiety.    . diclofenac sodium (VOLTAREN) 1 % GEL Apply 2-4 g topically 4 (four) times daily.     Marland Kitchen dicyclomine (BENTYL) 10 MG capsule Take 1 capsule (10 mg total) by mouth 4 (four) times daily -  before meals and at bedtime. For abdominal cramping and loose stools 120 capsule 5  . diltiazem (CARDIZEM CD) 180 MG 24 hr capsule Take 1 capsule (180 mg  total) by mouth daily. 90 capsule 3  . esomeprazole (NEXIUM) 40 MG capsule Take 40 mg by mouth daily before breakfast.    . flecainide (TAMBOCOR) 50 MG tablet Take 1 tablet (50 mg total) by mouth 2 (two) times daily. 180 tablet 3  . fluconazole (DIFLUCAN) 100 MG tablet as needed.     . furosemide (LASIX) 80 MG tablet Take 80 mg by mouth 2 (two) times daily.      Marland Kitchen gabapentin (NEURONTIN) 600 MG tablet Take 600 mg by mouth 4 (four) times daily.    . hydrocortisone (PROCTOSOL HC) 2.5 % rectal cream Place 1 application rectally 2 (two) times daily. 30 g 0  . hydrOXYzine (ATARAX/VISTARIL) 25 MG tablet Take 25 mg by mouth 2 (two) times daily as needed for anxiety or itching.     . isosorbide mononitrate (IMDUR) 120 MG 24 hr tablet Take 120 mg by mouth every morning.     . isosorbide mononitrate (IMDUR) 30 MG 24 hr tablet Take 30 mg by mouth every evening.     Marland Kitchen lisinopril (PRINIVIL,ZESTRIL) 2.5 MG tablet Take 2.5 mg by mouth at bedtime.     Marland Kitchen loratadine (CLARITIN) 10 MG tablet Take 10 mg by mouth daily.      . meclizine (ANTIVERT) 25 MG tablet Take 25 mg by mouth 2 (two) times daily as needed.     . metFORMIN (GLUCOPHAGE) 500 MG tablet Take 500 mg by mouth daily.    . nitroGLYCERIN (NITROLINGUAL) 0.4 MG/SPRAY spray Place 1 spray under the tongue as needed. Chest pain    . NON FORMULARY BIPAP - STARTED ABOUT 1 1/2 years AGO.    Marland Kitchen nystatin (MYCOSTATIN/NYSTOP) 100000 UNIT/GM POWD Apply 1 g topically 2 (two) times daily.    . OPANA ER, CRUSH RESISTANT, 20 MG T12A Take 1 tablet by mouth 2 (two) times daily.    Marland Kitchen oxyCODONE-acetaminophen (PERCOCET) 10-325 MG per tablet Take 1 tablet by mouth every 6 (six) hours as needed. For pain.    . polyethylene glycol powder (GLYCOLAX/MIRALAX) powder as needed.     . potassium chloride SA (K-DUR,KLOR-CON) 20 MEQ tablet Take 40 mEq by mouth 4 (four) times daily.      . Rivaroxaban (XARELTO) 20 MG TABS Take 20 mg by mouth daily.    Marland Kitchen spironolactone (ALDACTONE) 50 MG  tablet Take 50 mg by mouth 3 (three) times daily.      . tamsulosin (FLOMAX) 0.4 MG CAPS capsule Take 0.4 mg by mouth daily.     No current facility-administered medications for this visit.    Allergies:  Hyoscyamine sulfate and Metoclopramide hcl   Social History: The patient  reports that she quit smoking about 7 years ago. Her smoking use included Cigarettes. She started smoking about 37 years ago. She has a 45 pack-year smoking history. She has never used smokeless tobacco. She reports that she does not drink alcohol  or use illicit drugs.   ROS:  Please see the history of present illness. Otherwise, complete review of systems is positive for dyspnea exertion, palpitations, no chest pain or syncope.  All other systems are reviewed and negative.    Physical Exam: VS:  BP 108/71 mmHg  Pulse 69  Ht 5\' 9"  (1.753 m)  Wt 288 lb 1.9 oz (130.69 kg)  BMI 42.53 kg/m2  SpO2 98%, BMI Body mass index is 42.53 kg/(m^2).  Wt Readings from Last 3 Encounters:  03/20/14 288 lb 1.9 oz (130.69 kg)  02/20/14 288 lb 6.4 oz (130.817 kg)  12/15/13 288 lb 9.6 oz (130.908 kg)     Obese woman in no acute distress.  HEENT: Conjunctiva and lids normal, oropharynx clear.  Neck: No elevated JVP or carotid bruits.  Lungs: Clear to auscultation, nonlabored breathing at rest.  Cardiac: Irregularly irregular, no S3 or significant systolic murmur, no pericardial rub.  Abdomen: Soft, nontender, bowel sounds present.  Extremities: Mild edema, distal pulses 2+. Uses a cane to walk. Skin: Warm and dry. Musculoskeletal: No kyphosis. Neuropsychiatric: Alert and oriented 3, affect appropriate.   ECG: ECG is ordered today and reviewed showing rate-controlled atrial fibrillation.   Recent Labwork: 05/28/2013: Hemoglobin 12.8 12/04/2013: Creatinine 0.60   Assessment and Plan:  1. Persistent, symptomatic atrial fibrillation despite adequate heart rate control. She has been on Xarelto uninterrupted for  greater than a month, continues on flecainide and Cardizem CD. Plan is to schedule an elective DCCV with Dr. Bronson Ing who will be covering the hospital next week in [place]. Follow-up lab work at presentation, also hold Cardizem CD that morning just in case she is significantly bradycardic following her cardioversion.  2. Essential hypertension, blood pressure is normal today.  3. Type 2 diabetes mellitus, followed by Dr. Woody Seller.   Current medicines are reviewed at length with the patient today.     Orders Placed This Encounter  Procedures  . EKG 12-Lead    Disposition: FU with me in 3 weeks.   Signed, Satira Sark, MD, [facility] 03/20/2014 1:42 PM    [facility] [place] [facility] [place], [place], [place] [address] Phone: [phone]; Fax: [phone]

## 2014-03-20 NOTE — Telephone Encounter (Signed)
CHECKING PERCERT  DCCV scheduled for Thursday, March 26, 2014 @9 :00 am with Dr. Bronson Ing for perssistant atrial fibrillation

## 2014-03-20 NOTE — Patient Instructions (Signed)
Your physician recommends that you schedule a follow-up appointment in: 3 weeks. Your physician recommends that you continue on your current medications as directed. Please refer to the Current Medication list given to you today. Your physician has recommended that you have a Cardioversion (DCCV). Electrical Cardioversion uses a jolt of electricity to your heart either through paddles or wired patches attached to your chest. This is a controlled, usually prescheduled, procedure. Defibrillation is done under light anesthesia in the hospital, and you usually go home the day of the procedure. This is done to get your heart back into a normal rhythm. You are not awake for the procedure. Please see the instruction sheet given to you today.

## 2014-03-26 ENCOUNTER — Ambulatory Visit (HOSPITAL_COMMUNITY): Payer: Medicare Other | Admitting: Anesthesiology

## 2014-03-26 ENCOUNTER — Ambulatory Visit (HOSPITAL_COMMUNITY)
Admission: RE | Admit: 2014-03-26 | Discharge: 2014-03-26 | Disposition: A | Discharge status: Home / Self Care | Payer: Medicare Other | Source: Ambulatory Visit | Attending: Cardiovascular Disease | Admitting: Cardiovascular Disease

## 2014-03-26 ENCOUNTER — Encounter (HOSPITAL_COMMUNITY): Payer: Self-pay | Admitting: *Deleted

## 2014-03-26 ENCOUNTER — Encounter (HOSPITAL_COMMUNITY)
Admission: RE | Disposition: A | Discharge status: Home / Self Care | Payer: Self-pay | Source: Ambulatory Visit | Attending: Cardiovascular Disease

## 2014-03-26 DIAGNOSIS — M1990 Unspecified osteoarthritis, unspecified site: Secondary | ICD-10-CM | POA: Diagnosis not present

## 2014-03-26 DIAGNOSIS — Z888 Allergy status to other drugs, medicaments and biological substances status: Secondary | ICD-10-CM | POA: Diagnosis not present

## 2014-03-26 DIAGNOSIS — M797 Fibromyalgia: Secondary | ICD-10-CM | POA: Insufficient documentation

## 2014-03-26 DIAGNOSIS — E119 Type 2 diabetes mellitus without complications: Secondary | ICD-10-CM | POA: Insufficient documentation

## 2014-03-26 DIAGNOSIS — Z87891 Personal history of nicotine dependence: Secondary | ICD-10-CM | POA: Insufficient documentation

## 2014-03-26 DIAGNOSIS — I482 Chronic atrial fibrillation: Secondary | ICD-10-CM

## 2014-03-26 DIAGNOSIS — K219 Gastro-esophageal reflux disease without esophagitis: Secondary | ICD-10-CM | POA: Insufficient documentation

## 2014-03-26 DIAGNOSIS — I481 Persistent atrial fibrillation: Secondary | ICD-10-CM | POA: Diagnosis not present

## 2014-03-26 DIAGNOSIS — Z90710 Acquired absence of both cervix and uterus: Secondary | ICD-10-CM | POA: Insufficient documentation

## 2014-03-26 DIAGNOSIS — J449 Chronic obstructive pulmonary disease, unspecified: Secondary | ICD-10-CM | POA: Diagnosis not present

## 2014-03-26 DIAGNOSIS — Z87440 Personal history of urinary (tract) infections: Secondary | ICD-10-CM | POA: Insufficient documentation

## 2014-03-26 DIAGNOSIS — Z9049 Acquired absence of other specified parts of digestive tract: Secondary | ICD-10-CM | POA: Diagnosis not present

## 2014-03-26 DIAGNOSIS — I4891 Unspecified atrial fibrillation: Secondary | ICD-10-CM | POA: Diagnosis present

## 2014-03-26 DIAGNOSIS — I10 Essential (primary) hypertension: Secondary | ICD-10-CM | POA: Insufficient documentation

## 2014-03-26 HISTORY — PX: CARDIOVERSION: SHX1299

## 2014-03-26 LAB — CBC WITH DIFFERENTIAL/PLATELET
BASOS PCT: 0 % (ref 0–1)
Basophils Absolute: 0 10*3/uL (ref 0.0–0.1)
Eosinophils Absolute: 0.3 10*3/uL (ref 0.0–0.7)
Eosinophils Relative: 2 % (ref 0–5)
HEMATOCRIT: 38.8 % (ref 36.0–46.0)
Hemoglobin: 11.9 g/dL — ABNORMAL LOW (ref 12.0–15.0)
LYMPHS ABS: 2.1 10*3/uL (ref 0.7–4.0)
Lymphocytes Relative: 19 % (ref 12–46)
MCH: 24.9 pg — ABNORMAL LOW (ref 26.0–34.0)
MCHC: 30.7 g/dL (ref 30.0–36.0)
MCV: 81.3 fL (ref 78.0–100.0)
MONO ABS: 0.6 10*3/uL (ref 0.1–1.0)
MONOS PCT: 6 % (ref 3–12)
NEUTROS ABS: 7.9 10*3/uL — AB (ref 1.7–7.7)
Neutrophils Relative %: 73 % (ref 43–77)
Platelets: 346 10*3/uL (ref 150–400)
RBC: 4.77 MIL/uL (ref 3.87–5.11)
RDW: 14.1 % (ref 11.5–15.5)
WBC: 10.8 10*3/uL — AB (ref 4.0–10.5)

## 2014-03-26 LAB — BASIC METABOLIC PANEL
Anion gap: 8 (ref 5–15)
BUN: 13 mg/dL (ref 6–23)
CHLORIDE: 101 mmol/L (ref 96–112)
CO2: 28 mmol/L (ref 19–32)
Calcium: 9.6 mg/dL (ref 8.4–10.5)
Creatinine, Ser: 0.55 mg/dL (ref 0.50–1.10)
GFR calc Af Amer: >90 mL/min (ref >90)
Glucose, Bld: 141 mg/dL — ABNORMAL HIGH (ref 70–99)
POTASSIUM: 4.3 mmol/L (ref 3.5–5.1)
SODIUM: 137 mmol/L (ref 135–145)

## 2014-03-26 LAB — GLUCOSE, CAPILLARY: Glucose-Capillary: 128 mg/dL — ABNORMAL HIGH (ref 70–99)

## 2014-03-26 SURGERY — CARDIOVERSION
Anesthesia: Monitor Anesthesia Care

## 2014-03-26 MED ORDER — ONDANSETRON HCL 4 MG/2ML IJ SOLN
INTRAMUSCULAR | Status: AC
  Filled 2014-03-26: qty 2

## 2014-03-26 MED ORDER — PROPOFOL 10 MG/ML IV BOLUS
INTRAVENOUS | Status: AC
  Filled 2014-03-26: qty 20

## 2014-03-26 MED ORDER — ONDANSETRON HCL 4 MG/2ML IJ SOLN: 4.0000 mg | Freq: Once | INTRAMUSCULAR | Status: DC | PRN

## 2014-03-26 MED ORDER — MIDAZOLAM HCL 5 MG/5ML IJ SOLN
INTRAMUSCULAR | Status: DC | PRN
  Administered 2014-03-26: 2 mg via INTRAVENOUS

## 2014-03-26 MED ORDER — MIDAZOLAM HCL 2 MG/2ML IJ SOLN
INTRAMUSCULAR | Status: AC
  Filled 2014-03-26: qty 2

## 2014-03-26 MED ORDER — FENTANYL CITRATE 0.05 MG/ML IJ SOLN
INTRAMUSCULAR | Status: AC
  Filled 2014-03-26: qty 2

## 2014-03-26 MED ORDER — ONDANSETRON HCL 4 MG/2ML IJ SOLN
4.0000 mg | Freq: Once | INTRAMUSCULAR | Status: AC
  Administered 2014-03-26: 4 mg via INTRAVENOUS

## 2014-03-26 MED ORDER — FENTANYL CITRATE 0.05 MG/ML IJ SOLN: 25.0000 ug | INTRAMUSCULAR | Status: AC

## 2014-03-26 MED ORDER — PROPOFOL INFUSION 10 MG/ML OPTIME
INTRAVENOUS | Status: DC | PRN
  Administered 2014-03-26: 200 ug/kg/min via INTRAVENOUS

## 2014-03-26 MED ORDER — MIDAZOLAM HCL 2 MG/2ML IJ SOLN: 1.0000 mg | INTRAMUSCULAR | Status: DC | PRN

## 2014-03-26 MED ORDER — LACTATED RINGERS IV SOLN
INTRAVENOUS | Status: DC
  Administered 2014-03-26: 09:00:00 via INTRAVENOUS

## 2014-03-26 MED ORDER — FENTANYL CITRATE 0.05 MG/ML IJ SOLN
INTRAMUSCULAR | Status: DC | PRN
  Administered 2014-03-26 (×3): 25 ug via INTRAVENOUS

## 2014-03-26 MED ORDER — FENTANYL CITRATE 0.05 MG/ML IJ SOLN: 25.0000 ug | INTRAMUSCULAR | Status: DC | PRN

## 2014-03-26 NOTE — Anesthesia Postprocedure Evaluation (Signed)
  Anesthesia Post-op Note  Patient: [name] [name]  Procedure(s) Performed: Procedure(s): CARDIOVERSION (N/A)  Patient Location: PACU  Anesthesia Type:MAC  Level of Consciousness: awake, alert  and oriented  Airway and Oxygen Therapy: Patient Spontanous Breathing  Post-op Pain: none  Post-op Assessment: Post-op Vital signs reviewed, Patient's Cardiovascular Status Stable, Respiratory Function Stable, Patent Airway and No signs of Nausea or vomiting  Post-op Vital Signs: Reviewed and stable  Last Vitals:  Filed Vitals:   03/26/14 0932  BP:   Pulse:   Temp:   Resp: 15    Complications: No apparent anesthesia complications

## 2014-03-26 NOTE — Interval H&P Note (Signed)
History and Physical Interval Note:  No changes. Will proceed with DCCV as planned.  03/26/2014 8:33 AM  [name] [name]  has presented today for surgery, with the diagnosis of persistent atrial fibrillation  The various methods of treatment have been discussed with the patient and family. After consideration of risks, benefits and other options for treatment, the patient has consented to  Procedure(s): CARDIOVERSION (N/[name]) as [name] surgical intervention .  The patient's history has been reviewed, patient examined, no change in status, stable for surgery.  I have reviewed the patient's chart and labs.  Questions were answered to the patient's satisfaction.     [name] [name]

## 2014-03-26 NOTE — Discharge Instructions (Signed)

## 2014-03-26 NOTE — H&P (View-Only) (Signed)
Cardiology Office Note  Date: 03/20/2014   ID: [name] [name], DOB 1960-01-22, MRN [id number]  PCP: Glenda Chroman., MD  Primary Cardiologist: Rozann Lesches, MD   Chief Complaint  Patient presents with  . Atrial Fibrillation    History of Present Illness: [name] [name] is a 54 y.o. female seen recently in February with documented persistent atrial fibrillation. In addition to baseline anticoagulation with the Xarelto, she was started on flecainide and had her Cardizem CD dose increased. She comes in for a follow-up visit today.  Unfortunately, she remains in atrial fibrillation, symptomatic in terms of dyspnea on exertion and palpitations, but her heart rate does look better controlled. Follow-up ECG as noted below.  We discussed arranging an outpatient DCCV to restore sinus rhythm, and will plan to leave her on flecainide in hopes that this will help maintain sinus rhythm. We are scheduling this for next week in [place] with Dr Bronson Ing will be covering the hospital at that time. She has been on uninterrupted Xarelto for greater than a month.   Past Medical History  Diagnosis Date  . Atrial fibrillation     Paroxysmal  . Essential hypertension, benign   . Type 2 diabetes mellitus   . History of cardiac catheterization     Normal coronary arteries 11/12  . Esophageal spasm     NTG and Norvasc  . Fibromyalgia   . Irritable bowel syndrome   . COPD (chronic obstructive pulmonary disease)   . Lymphedema   . GERD (gastroesophageal reflux disease)   . DDD (degenerative disc disease)   . Osteoarthritis   . Gastroparesis     remote past  . Urinary tract infection     Past Surgical History  Procedure Laterality Date  . Back surgery    . Cholecystectomy    . Appendectomy    . Abdominal hysterectomy    . Lung biopsy    . Knee surgery    . Esophagogastroduodenoscopy  10/12/2004    LYY:TKPT plaquing on the esophageal mucosa of uncertain significance, not typical of  what is seen with candida esophagitis status post KOH brushing for KOH prep and biopsy for histology. Rule out candida esophagitis/eosinophilic esophagitis. Otherwise normal esophagus. Tiny hiatal hernia. Otherwise, normal stomach, normal D1 and D2. Benign biopsy of esophagus, unknown KOH status.   . Colonoscopy      Approximately 2003. Per medical records, internal hemorrhoids noted  . Colonoscopy N/A 06/25/2013    Dr. Gala Romney: Anal canal hemorrhoids-more likely the source of paper hematochezia. Redundant, capacious colon. Multiple colonic polyps-tubular adenoma  . Esophagogastroduodenoscopy N/A 06/25/2013    Dr. Rourk:mild chronic gastritis  . Left heart catheterization with coronary angiogram N/A 11/15/2010    Procedure: LEFT HEART CATHETERIZATION WITH CORONARY ANGIOGRAM;  Surgeon: Laverda Page, MD;  Location: [facility] CATH LAB;  Service: Cardiovascular;  Laterality: N/A;    Current Outpatient Prescriptions  Medication Sig Dispense Refill  . albuterol (PROAIR HFA) 108 (90 BASE) MCG/ACT inhaler Inhale 2 puffs into the lungs every 6 (six) hours as needed for wheezing or shortness of breath.     Marland Kitchen albuterol (PROVENTIL) (2.5 MG/3ML) 0.083% nebulizer solution Take 2.5 mg by nebulization every 6 (six) hours as needed. For shortness of breath.     Marland Kitchen amoxicillin (AMOXIL) 500 MG capsule As needed prior to dental work    . baclofen (LIORESAL) 20 MG tablet Take 20 mg by mouth 3 (three) times daily.      Marland Kitchen buPROPion (WELLBUTRIN) 75 MG  tablet Take 75 mg by mouth daily.     . diazepam (VALIUM) 5 MG tablet Take 5 mg by mouth every 12 (twelve) hours as needed. For anxiety.    . diclofenac sodium (VOLTAREN) 1 % GEL Apply 2-4 g topically 4 (four) times daily.     Marland Kitchen dicyclomine (BENTYL) 10 MG capsule Take 1 capsule (10 mg total) by mouth 4 (four) times daily -  before meals and at bedtime. For abdominal cramping and loose stools 120 capsule 5  . diltiazem (CARDIZEM CD) 180 MG 24 hr capsule Take 1 capsule (180 mg  total) by mouth daily. 90 capsule 3  . esomeprazole (NEXIUM) 40 MG capsule Take 40 mg by mouth daily before breakfast.    . flecainide (TAMBOCOR) 50 MG tablet Take 1 tablet (50 mg total) by mouth 2 (two) times daily. 180 tablet 3  . fluconazole (DIFLUCAN) 100 MG tablet as needed.     . furosemide (LASIX) 80 MG tablet Take 80 mg by mouth 2 (two) times daily.      Marland Kitchen gabapentin (NEURONTIN) 600 MG tablet Take 600 mg by mouth 4 (four) times daily.    . hydrocortisone (PROCTOSOL HC) 2.5 % rectal cream Place 1 application rectally 2 (two) times daily. 30 g 0  . hydrOXYzine (ATARAX/VISTARIL) 25 MG tablet Take 25 mg by mouth 2 (two) times daily as needed for anxiety or itching.     . isosorbide mononitrate (IMDUR) 120 MG 24 hr tablet Take 120 mg by mouth every morning.     . isosorbide mononitrate (IMDUR) 30 MG 24 hr tablet Take 30 mg by mouth every evening.     Marland Kitchen lisinopril (PRINIVIL,ZESTRIL) 2.5 MG tablet Take 2.5 mg by mouth at bedtime.     Marland Kitchen loratadine (CLARITIN) 10 MG tablet Take 10 mg by mouth daily.      . meclizine (ANTIVERT) 25 MG tablet Take 25 mg by mouth 2 (two) times daily as needed.     . metFORMIN (GLUCOPHAGE) 500 MG tablet Take 500 mg by mouth daily.    . nitroGLYCERIN (NITROLINGUAL) 0.4 MG/SPRAY spray Place 1 spray under the tongue as needed. Chest pain    . NON FORMULARY BIPAP - STARTED ABOUT 1 1/2 years AGO.    Marland Kitchen nystatin (MYCOSTATIN/NYSTOP) 100000 UNIT/GM POWD Apply 1 g topically 2 (two) times daily.    . OPANA ER, CRUSH RESISTANT, 20 MG T12A Take 1 tablet by mouth 2 (two) times daily.    Marland Kitchen oxyCODONE-acetaminophen (PERCOCET) 10-325 MG per tablet Take 1 tablet by mouth every 6 (six) hours as needed. For pain.    . polyethylene glycol powder (GLYCOLAX/MIRALAX) powder as needed.     . potassium chloride SA (K-DUR,KLOR-CON) 20 MEQ tablet Take 40 mEq by mouth 4 (four) times daily.      . Rivaroxaban (XARELTO) 20 MG TABS Take 20 mg by mouth daily.    Marland Kitchen spironolactone (ALDACTONE) 50 MG  tablet Take 50 mg by mouth 3 (three) times daily.      . tamsulosin (FLOMAX) 0.4 MG CAPS capsule Take 0.4 mg by mouth daily.     No current facility-administered medications for this visit.    Allergies:  Hyoscyamine sulfate and Metoclopramide hcl   Social History: The patient  reports that she quit smoking about 7 years ago. Her smoking use included Cigarettes. She started smoking about 37 years ago. She has a 45 pack-year smoking history. She has never used smokeless tobacco. She reports that she does not drink alcohol  or use illicit drugs.   ROS:  Please see the history of present illness. Otherwise, complete review of systems is positive for dyspnea exertion, palpitations, no chest pain or syncope.  All other systems are reviewed and negative.    Physical Exam: VS:  BP 108/71 mmHg  Pulse 69  Ht 5\' 9"  (1.753 m)  Wt 288 lb 1.9 oz (130.69 kg)  BMI 42.53 kg/m2  SpO2 98%, BMI Body mass index is 42.53 kg/(m^2).  Wt Readings from Last 3 Encounters:  03/20/14 288 lb 1.9 oz (130.69 kg)  02/20/14 288 lb 6.4 oz (130.817 kg)  12/15/13 288 lb 9.6 oz (130.908 kg)     Obese woman in no acute distress.  HEENT: Conjunctiva and lids normal, oropharynx clear.  Neck: No elevated JVP or carotid bruits.  Lungs: Clear to auscultation, nonlabored breathing at rest.  Cardiac: Irregularly irregular, no S3 or significant systolic murmur, no pericardial rub.  Abdomen: Soft, nontender, bowel sounds present.  Extremities: Mild edema, distal pulses 2+. Uses a cane to walk. Skin: Warm and dry. Musculoskeletal: No kyphosis. Neuropsychiatric: Alert and oriented 3, affect appropriate.   ECG: ECG is ordered today and reviewed showing rate-controlled atrial fibrillation.   Recent Labwork: 05/28/2013: Hemoglobin 12.8 12/04/2013: Creatinine 0.60   Assessment and Plan:  1. Persistent, symptomatic atrial fibrillation despite adequate heart rate control. She has been on Xarelto uninterrupted for  greater than a month, continues on flecainide and Cardizem CD. Plan is to schedule an elective DCCV with Dr. Bronson Ing who will be covering the hospital next week in [place]. Follow-up lab work at presentation, also hold Cardizem CD that morning just in case she is significantly bradycardic following her cardioversion.  2. Essential hypertension, blood pressure is normal today.  3. Type 2 diabetes mellitus, followed by Dr. Woody Seller.   Current medicines are reviewed at length with the patient today.     Orders Placed This Encounter  Procedures  . EKG 12-Lead    Disposition: FU with me in 3 weeks.   Signed, Satira Sark, MD, [facility] 03/20/2014 1:42 PM    [place] [facility] [place], [place], [place] [address] Phone: [phone]; Fax: [phone]

## 2014-03-26 NOTE — Transfer of Care (Signed)
Immediate Anesthesia Transfer of Care Note  Patient: [name] [name]  Procedure(s) Performed: Procedure(s): CARDIOVERSION (N/A)  Patient Location: PACU  Anesthesia Type:MAC  Level of Consciousness: awake, alert  and oriented  Airway & Oxygen Therapy: Patient Spontanous Breathing and Patient connected to nasal cannula oxygen  Post-op Assessment: Report given to RN  Post vital signs: Reviewed and stable  Last Vitals:  Filed Vitals:   03/26/14 0932  BP:   Pulse:   Temp:   Resp: 15    Complications: No apparent anesthesia complications

## 2014-03-26 NOTE — Procedures (Signed)
After written informed consent was obtained, the patient was sedated by anesthesiology. Rhythm was rate-controlled atrial fibrillation, 60 bpm range. Initially, 150 J were administered which was unable to successfully cardiovert the patient. Finally, 200 J were administered which was also unsuccessful. The patient remains in atrial fibrillation. The results were discussed with family members. The patient will follow up with Dr. Domenic Polite for further management.

## 2014-03-26 NOTE — Anesthesia Preprocedure Evaluation (Addendum)
Anesthesia Evaluation  Patient identified by MRN, date of birth, ID band Patient awake    Reviewed: Allergy & Precautions, NPO status , Patient's Chart, lab work & pertinent test results  Airway Mallampati: II  TM Distance: <3 FB   Mouth opening: Limited Mouth Opening  Dental  (+) Partial Upper   Pulmonary sleep apnea , COPDformer smoker,  breath sounds clear to auscultation        Cardiovascular hypertension, Pt. on medications + dysrhythmias Atrial Fibrillation Rhythm:Irregular Rate:Normal     Neuro/Psych    GI/Hepatic GERD-  Medicated and Controlled,  Endo/Other  diabetes, Well Controlled, Type 2, Oral Hypoglycemic AgentsMorbid obesity  Renal/GU      Musculoskeletal  (+) Arthritis -, Fibromyalgia -  Abdominal   Peds  Hematology   Anesthesia Other Findings   Reproductive/Obstetrics                           Anesthesia Physical Anesthesia Plan  ASA: III  Anesthesia Plan: MAC   Post-op Pain Management:    Induction: Intravenous  Airway Management Planned: Simple Face Mask  Additional Equipment:   Intra-op Plan:   Post-operative Plan:   Informed Consent: I have reviewed the patients History and Physical, chart, labs and discussed the procedure including the risks, benefits and alternatives for the proposed anesthesia with the patient or authorized representative who has indicated his/her understanding and acceptance.     Plan Discussed with:   Anesthesia Plan Comments:         Anesthesia Quick Evaluation

## 2014-03-27 NOTE — Addendum Note (Signed)
Addendum  created 03/27/14 1058 by Ollen Bowl, CRNA   Modules edited: Anesthesia Attestations

## 2014-03-31 ENCOUNTER — Encounter (HOSPITAL_COMMUNITY): Payer: Self-pay | Admitting: Cardiovascular Disease

## 2014-04-10 ENCOUNTER — Other Ambulatory Visit: Payer: Self-pay | Admitting: *Deleted

## 2014-04-10 ENCOUNTER — Ambulatory Visit (INDEPENDENT_AMBULATORY_CARE_PROVIDER_SITE_OTHER): Payer: Medicare Other | Admitting: Cardiology

## 2014-04-10 ENCOUNTER — Encounter: Payer: Self-pay | Admitting: Cardiology

## 2014-04-10 VITALS — BP 118/82 | HR 70

## 2014-04-10 DIAGNOSIS — I10 Essential (primary) hypertension: Secondary | ICD-10-CM | POA: Diagnosis not present

## 2014-04-10 DIAGNOSIS — I4819 Other persistent atrial fibrillation: Secondary | ICD-10-CM

## 2014-04-10 DIAGNOSIS — R002 Palpitations: Secondary | ICD-10-CM

## 2014-04-10 DIAGNOSIS — I481 Persistent atrial fibrillation: Secondary | ICD-10-CM | POA: Diagnosis not present

## 2014-04-10 MED ORDER — FLECAINIDE ACETATE 50 MG PO TABS: 100.0000 mg | ORAL_TABLET | Freq: Two times a day (BID) | ORAL | Status: DC

## 2014-04-10 NOTE — Patient Instructions (Signed)
   Increase Flecainide to 100mg  twice a day  - patient will take 2 tabs of her 50mg  tablet twice a day till next visit.   Continue all other medications.   Your physician has recommended that you wear a 24 hour holter monitor. Holter monitors are medical devices that record the heart's electrical activity. Doctors most often use these monitors to diagnose arrhythmias. Arrhythmias are problems with the speed or rhythm of the heartbeat. The monitor is a small, portable device. You can wear one while you do your normal daily activities. This is usually used to diagnose what is causing palpitations/syncope (passing out). Office will contact with results via phone or letter.   Follow up in  2-3 weeks

## 2014-04-10 NOTE — Progress Notes (Signed)
Cardiology Office Note  Date: 04/10/2014   ID: [name] [name], DOB [birth year], MRN [id number]  PCP: Glenda Chroman., MD  Primary Cardiologist: Rozann Lesches, MD   Chief Complaint  Patient presents with  . Atrial Fibrillation    History of Present Illness: [name] [name] is a 54 y.o. female seen in March with persistent atrial fibrillation. She has been anticoagulated with Xarelto and treated with Cardizem CD, most recent addition was flecainide prior to arranging elective cardioversion attempt to restore sinus rhythm. She presented on March 24 and underwent 2 separate shocks with Dr. Bronson Ing, 150 J followed by 200 J which failed to convert the patient back to sinus rhythm.  She presents today for a follow-up visit. She reports fatigue, shortness of breath, and palpitations which she attributes to the atrial fibrillation. Today's blood pressure and heart rate are well controlled at rest. She states that her heart rate "runs away" when she walks around. She reports compliance with her medications.  Today we discussed obtaining a Holter monitor to better assess heart rate variability which will help me know how to adjust her medications. We will also increase her flecainide dose and consider an additional cardioversion attempt prior to deviating course to a different antiarrhythmic or EP consultation. Frankly, it may be difficult to keep her in sinus rhythm.   Past Medical History  Diagnosis Date  . Atrial fibrillation     Paroxysmal  . Essential hypertension, benign   . Type 2 diabetes mellitus   . History of cardiac catheterization     Normal coronary arteries 11/12  . Esophageal spasm     NTG and Norvasc  . Fibromyalgia   . Irritable bowel syndrome   . COPD (chronic obstructive pulmonary disease)   . Lymphedema   . GERD (gastroesophageal reflux disease)   . DDD (degenerative disc disease)   . Osteoarthritis   . Gastroparesis   . Urinary tract infection     Past  Surgical History  Procedure Laterality Date  . Back surgery    . Cholecystectomy    . Appendectomy    . Abdominal hysterectomy    . Lung biopsy    . Knee surgery    . Esophagogastroduodenoscopy  10/12/2004    HLK:TGYB plaquing on the esophageal mucosa of uncertain significance, not typical of what is seen with candida esophagitis status post KOH brushing for KOH prep and biopsy for histology. Rule out candida esophagitis/eosinophilic esophagitis. Otherwise normal esophagus. Tiny hiatal hernia. Otherwise, normal stomach, normal D1 and D2. Benign biopsy of esophagus, unknown KOH status.   . Colonoscopy      Approximately 2003. Per medical records, internal hemorrhoids noted  . Colonoscopy N/A 06/25/2013    Dr. Gala Romney: Anal canal hemorrhoids-more likely the source of paper hematochezia. Redundant, capacious colon. Multiple colonic polyps-tubular adenoma  . Esophagogastroduodenoscopy N/A 06/25/2013    Dr. Rourk:mild chronic gastritis  . Left heart catheterization with coronary angiogram N/A 11/15/2010    Procedure: LEFT HEART CATHETERIZATION WITH CORONARY ANGIOGRAM;  Surgeon: Laverda Page, MD;  Location: [facility] CATH LAB;  Service: Cardiovascular;  Laterality: N/A;  . Cardioversion N/A 03/26/2014    Procedure: CARDIOVERSION;  Surgeon: Herminio Commons, MD;  Location: [facility] ORS;  Service: Endoscopy;  Laterality: N/A;    Current Outpatient Prescriptions  Medication Sig Dispense Refill  . albuterol (PROAIR HFA) 108 (90 BASE) MCG/ACT inhaler Inhale 2 puffs into the lungs every 6 (six) hours as needed for wheezing or shortness of breath.     Marland Kitchen  albuterol (PROVENTIL) (2.5 MG/3ML) 0.083% nebulizer solution Take 2.5 mg by nebulization every 6 (six) hours as needed. For shortness of breath.     Marland Kitchen amoxicillin (AMOXIL) 500 MG capsule As needed prior to dental work    . amoxicillin-clavulanate (AUGMENTIN) 500-125 MG per tablet Take 1 tablet by mouth 2 (two) times daily. Bronchitis    . baclofen (LIORESAL) 20  MG tablet Take 20 mg by mouth 3 (three) times daily.      Marland Kitchen buPROPion (WELLBUTRIN) 75 MG tablet Take 75 mg by mouth daily.     . diazepam (VALIUM) 5 MG tablet Take 5 mg by mouth every 12 (twelve) hours as needed. For anxiety.    . diclofenac sodium (VOLTAREN) 1 % GEL Apply 2-4 g topically 4 (four) times daily.     Marland Kitchen dicyclomine (BENTYL) 10 MG capsule Take 1 capsule (10 mg total) by mouth 4 (four) times daily -  before meals and at bedtime. For abdominal cramping and loose stools 120 capsule 5  . diltiazem (CARDIZEM CD) 180 MG 24 hr capsule Take 1 capsule (180 mg total) by mouth daily. 90 capsule 3  . esomeprazole (NEXIUM) 40 MG capsule Take 40 mg by mouth daily before breakfast.    . flecainide (TAMBOCOR) 50 MG tablet Take 1 tablet (50 mg total) by mouth 2 (two) times daily. 180 tablet 3  . furosemide (LASIX) 80 MG tablet Take 80 mg by mouth 2 (two) times daily.      Marland Kitchen gabapentin (NEURONTIN) 600 MG tablet Take 600 mg by mouth 4 (four) times daily.    . hydrocortisone (PROCTOSOL HC) 2.5 % rectal cream Place 1 application rectally 2 (two) times daily. 30 g 0  . hydrOXYzine (ATARAX/VISTARIL) 25 MG tablet Take 25 mg by mouth 2 (two) times daily as needed for anxiety or itching.     . isosorbide mononitrate (IMDUR) 120 MG 24 hr tablet Take 120 mg by mouth every morning.     . isosorbide mononitrate (IMDUR) 30 MG 24 hr tablet Take 30 mg by mouth every evening.     Marland Kitchen lisinopril (PRINIVIL,ZESTRIL) 2.5 MG tablet Take 2.5 mg by mouth at bedtime.     Marland Kitchen loratadine (CLARITIN) 10 MG tablet Take 10 mg by mouth daily.      . meclizine (ANTIVERT) 25 MG tablet Take 25 mg by mouth 2 (two) times daily as needed.     . metFORMIN (GLUCOPHAGE) 500 MG tablet Take 500 mg by mouth daily.    . nitroGLYCERIN (NITROLINGUAL) 0.4 MG/SPRAY spray Place 1 spray under the tongue as needed. Chest pain    . NON FORMULARY BIPAP - STARTED ABOUT 1 1/2 years AGO.    Marland Kitchen nystatin (MYCOSTATIN/NYSTOP) 100000 UNIT/GM POWD Apply 1 g topically  2 (two) times daily.    . OPANA ER, CRUSH RESISTANT, 20 MG T12A Take 1 tablet by mouth 2 (two) times daily.    Marland Kitchen oxyCODONE-acetaminophen (PERCOCET) 10-325 MG per tablet Take 1 tablet by mouth every 6 (six) hours as needed. For pain.    . polyethylene glycol powder (GLYCOLAX/MIRALAX) powder Take 17 g by mouth daily as needed for mild constipation.     . potassium chloride SA (K-DUR,KLOR-CON) 20 MEQ tablet Take 40 mEq by mouth 4 (four) times daily.      Marland Kitchen PREDNISONE, PAK, PO Take by mouth. Dose pack for bronchitis    . Rivaroxaban (XARELTO) 20 MG TABS Take 20 mg by mouth daily.    Marland Kitchen spironolactone (ALDACTONE) 50 MG tablet  Take 50 mg by mouth 3 (three) times daily.      . tamsulosin (FLOMAX) 0.4 MG CAPS capsule Take 0.4 mg by mouth daily.     No current facility-administered medications for this visit.    Allergies:  Hyoscyamine sulfate and Metoclopramide hcl   Social History: The patient  reports that she quit smoking about 7 years ago. Her smoking use included Cigarettes. She started smoking about 37 years ago. She has a 45 pack-year smoking history. She has never used smokeless tobacco. She reports that she does not drink alcohol or use illicit drugs.    ROS:  Please see the history of present illness. Otherwise, complete review of systems is positive for arthritic pains, shortness of breath and fatigue as noted.  All other systems are reviewed and negative.   Physical Exam: VS:  BP 118/82 mmHg  Pulse 70  SpO2 97%, BMI There is no weight on file to calculate BMI.  Wt Readings from Last 3 Encounters:  03/26/14 288 lb (130.636 kg)  03/20/14 288 lb 1.9 oz (130.69 kg)  02/20/14 288 lb 6.4 oz (130.817 kg)     Obese woman in no acute distress.  HEENT: Conjunctiva and lids normal, oropharynx clear.  Neck: No elevated JVP or carotid bruits.  Lungs: Clear to auscultation, nonlabored breathing at rest.  Cardiac: Irregularly irregular, no S3 or significant systolic murmur, no pericardial  rub.  Abdomen: Soft, nontender, bowel sounds present.  Extremities: Mild edema, distal pulses 2+. Uses a cane to walk. Skin: Warm and dry. Musculoskeletal: No kyphosis. Neuropsychiatric: Alert and oriented 3, affect appropriate.   ECG: ECG is not ordered today.   Recent Labwork: 03/26/2014: BUN 13; Creatinine 0.55; Hemoglobin 11.9*; Platelets 346; Potassium 4.3; Sodium 137   Assessment and Plan:  1. Persistent atrial fibrillation, failed recent cardioversion attempt as outlined. Plan at this time is to continue her current regimen except increase flecainide to 100 mg twice daily (she is 288 pounds), obtain a 24 Holter monitor to better assess heart rate variability, and even consider a follow-up cardioversion attempt. May need to consider EP consultation if unsuccessful at that point.  2. History of normal coronaries based on prior workup.  3. Essential hypertension, blood pressure well controlled today.  Current medicines were reviewed with the patient today.   Disposition: FU with me in 3 weeks.   Signed, Satira Sark, MD, [facility] 04/10/2014 5:00 PM    [facility] [address], [place], [place] [address] Phone: [phone]; Fax: [phone]

## 2014-04-20 ENCOUNTER — Ambulatory Visit: Payer: Medicare Other | Admitting: Cardiology

## 2014-04-21 ENCOUNTER — Telehealth: Payer: Self-pay | Admitting: Cardiology

## 2014-04-21 MED ORDER — FLECAINIDE ACETATE 50 MG PO TABS: 100.0000 mg | ORAL_TABLET | Freq: Two times a day (BID) | ORAL | Status: DC

## 2014-04-21 NOTE — Telephone Encounter (Signed)
Needs refill on the flecainide (TAMBOCOR) 50 MG tablet patient states that it needs to be 100mg  -[name] Patient states that she has been in A-Fib. Patient states that has had to use Nitro 3 times in the past week.

## 2014-04-21 NOTE — Telephone Encounter (Signed)
Spoke with patient and she c/o that she is using her nitroglycerin more than usual 3 times per week. Patient also c/o sob after walking and that she is back in a-fib. Patient's last appointment suggest to follow up in 2-3 weeks so patient was added to clinic tomorrow for an evaluation. New prescription sent to pharmacy for flecainide per patient request.

## 2014-04-22 ENCOUNTER — Telehealth: Payer: Self-pay | Admitting: Cardiology

## 2014-04-22 ENCOUNTER — Ambulatory Visit (INDEPENDENT_AMBULATORY_CARE_PROVIDER_SITE_OTHER): Payer: Medicare Other | Admitting: Cardiology

## 2014-04-22 ENCOUNTER — Other Ambulatory Visit: Payer: Self-pay | Admitting: Cardiology

## 2014-04-22 ENCOUNTER — Encounter: Payer: Self-pay | Admitting: Cardiology

## 2014-04-22 ENCOUNTER — Encounter: Payer: Self-pay | Admitting: *Deleted

## 2014-04-22 VITALS — BP 118/76 | HR 81 | Ht 69.0 in | Wt 284.0 lb

## 2014-04-22 DIAGNOSIS — R072 Precordial pain: Secondary | ICD-10-CM | POA: Diagnosis not present

## 2014-04-22 DIAGNOSIS — I481 Persistent atrial fibrillation: Secondary | ICD-10-CM | POA: Diagnosis not present

## 2014-04-22 DIAGNOSIS — I10 Essential (primary) hypertension: Secondary | ICD-10-CM

## 2014-04-22 DIAGNOSIS — I4819 Other persistent atrial fibrillation: Secondary | ICD-10-CM

## 2014-04-22 NOTE — Telephone Encounter (Signed)
DCCV holding cardizem at [facility] on 05/03/13 with Dr. Domenic Polite @10 :00 am Checking percert

## 2014-04-22 NOTE — Patient Instructions (Signed)
Your physician recommends that you continue on your current medications as directed. Please refer to the Current Medication list given to you today. Your physician has recommended that you have a Cardioversion (DCCV). Electrical Cardioversion uses a jolt of electricity to your heart either through paddles or wired patches attached to your chest. This is a controlled, usually prescheduled, procedure. Defibrillation is done under light anesthesia in the hospital, and you usually go home the day of the procedure. This is done to get your heart back into a normal rhythm. You are not awake for the procedure. Please see the instruction sheet given to you today.

## 2014-04-22 NOTE — Progress Notes (Signed)
Cardiology Office Note  Date: 04/22/2014   ID: NAZLY DIGILIO, DOB [birth year], MRN [id number]  PCP: Glenda Chroman., MD  Primary Cardiologist: Rozann Lesches, MD   Chief Complaint  Patient presents with  . Atrial Fibrillation  . Chest Pain    History of Present Illness: [name] [name] is a 54 y.o. female just recently seen in the office in early April at which time flecainide dose was increased with plan to consider follow-up cardioversion of persistent atrial fibrillation following a recent failed cardioversion attempt. She also had a follow-up cardiac monitor arranged to better assess heart rate variability. Monitor showed average heart rate in atrial fibrillation of 79 with peak up to the 130s and some bradycardia, although no pauses greater than 3 seconds. I reviewed these results with her today.  Phone call to nursing noted yesterday reporting recent use of nitroglycerin and persistent shortness of breath with activity in atrial fibrillation. She was placed on the schedule for today. Cardiac history is reviewed below including previous documentation of normal coronary arteries as well as esophageal spasm.  She remains in atrial fibrillation today. We have discussed arranging a follow-up attempt at cardioversion on higher dose flecainide to see if we can restore sinus rhythm. She had held sinus rhythm previously and was symptomatically improved for several months. Otherwise, I will plan to refer her to the atrial fibrillation clinic.   Past Medical History  Diagnosis Date  . Atrial fibrillation     Paroxysmal  . Essential hypertension, benign   . Type 2 diabetes mellitus   . History of cardiac catheterization     Normal coronary arteries 11/12  . Esophageal spasm     NTG and Norvasc  . Fibromyalgia   . Irritable bowel syndrome   . COPD (chronic obstructive pulmonary disease)   . Lymphedema   . GERD (gastroesophageal reflux disease)   . DDD (degenerative disc disease)     . Osteoarthritis   . Gastroparesis   . Urinary tract infection     Past Surgical History  Procedure Laterality Date  . Back surgery    . Cholecystectomy    . Appendectomy    . Abdominal hysterectomy    . Lung biopsy    . Knee surgery    . Esophagogastroduodenoscopy  10/12/2004    VQM:GQQP plaquing on the esophageal mucosa of uncertain significance, not typical of what is seen with candida esophagitis status post KOH brushing for KOH prep and biopsy for histology. Rule out candida esophagitis/eosinophilic esophagitis. Otherwise normal esophagus. Tiny hiatal hernia. Otherwise, normal stomach, normal D1 and D2. Benign biopsy of esophagus, unknown KOH status.   . Colonoscopy      Approximately 2003. Per medical records, internal hemorrhoids noted  . Colonoscopy N/A 06/25/2013    Dr. Gala Romney: Anal canal hemorrhoids-more likely the source of paper hematochezia. Redundant, capacious colon. Multiple colonic polyps-tubular adenoma  . Esophagogastroduodenoscopy N/A 06/25/2013    Dr. Rourk:mild chronic gastritis  . Left heart catheterization with coronary angiogram N/A 11/15/2010    Procedure: LEFT HEART CATHETERIZATION WITH CORONARY ANGIOGRAM;  Surgeon: Laverda Page, MD;  Location: [facility] CATH LAB;  Service: Cardiovascular;  Laterality: N/A;  . Cardioversion N/A 03/26/2014    Procedure: CARDIOVERSION;  Surgeon: Herminio Commons, MD;  Location: [facility] ORS;  Service: Endoscopy;  Laterality: N/A;    Current Outpatient Prescriptions  Medication Sig Dispense Refill  . albuterol (PROAIR HFA) 108 (90 BASE) MCG/ACT inhaler Inhale 2 puffs into the lungs every 6 (six)  hours as needed for wheezing or shortness of breath.     Marland Kitchen albuterol (PROVENTIL) (2.5 MG/3ML) 0.083% nebulizer solution Take 2.5 mg by nebulization every 6 (six) hours as needed. For shortness of breath.     Marland Kitchen amoxicillin (AMOXIL) 500 MG capsule As needed prior to dental work    . baclofen (LIORESAL) 20 MG tablet Take 20 mg by mouth 3 (three)  times daily.      Marland Kitchen buPROPion (WELLBUTRIN) 75 MG tablet Take 75 mg by mouth daily.     . diazepam (VALIUM) 5 MG tablet Take 5 mg by mouth every 12 (twelve) hours as needed. For anxiety.    . diclofenac sodium (VOLTAREN) 1 % GEL Apply 2-4 g topically 4 (four) times daily.     Marland Kitchen dicyclomine (BENTYL) 10 MG capsule Take 1 capsule (10 mg total) by mouth 4 (four) times daily -  before meals and at bedtime. For abdominal cramping and loose stools 120 capsule 5  . diltiazem (CARDIZEM CD) 180 MG 24 hr capsule Take 1 capsule (180 mg total) by mouth daily. 90 capsule 3  . esomeprazole (NEXIUM) 40 MG capsule Take 40 mg by mouth daily before breakfast.    . flecainide (TAMBOCOR) 50 MG tablet Take 2 tablets (100 mg total) by mouth 2 (two) times daily. 120 tablet 1  . furosemide (LASIX) 80 MG tablet Take 80 mg by mouth 2 (two) times daily.      Marland Kitchen gabapentin (NEURONTIN) 600 MG tablet Take 600 mg by mouth 4 (four) times daily.    . hydrocortisone (PROCTOSOL HC) 2.5 % rectal cream Place 1 application rectally 2 (two) times daily. 30 g 0  . hydrOXYzine (ATARAX/VISTARIL) 25 MG tablet Take 25 mg by mouth 2 (two) times daily as needed for anxiety or itching.     . isosorbide mononitrate (IMDUR) 120 MG 24 hr tablet Take 120 mg by mouth every morning.     . isosorbide mononitrate (IMDUR) 30 MG 24 hr tablet Take 30 mg by mouth every evening.     Marland Kitchen lisinopril (PRINIVIL,ZESTRIL) 2.5 MG tablet Take 2.5 mg by mouth at bedtime.     Marland Kitchen loratadine (CLARITIN) 10 MG tablet Take 10 mg by mouth daily.      . meclizine (ANTIVERT) 25 MG tablet Take 25 mg by mouth 2 (two) times daily as needed.     . metFORMIN (GLUCOPHAGE) 500 MG tablet Take 500 mg by mouth daily.    . nitroGLYCERIN (NITROLINGUAL) 0.4 MG/SPRAY spray Place 1 spray under the tongue as needed. Chest pain    . NON FORMULARY BIPAP - STARTED ABOUT 1 1/2 years AGO.    Marland Kitchen nystatin (MYCOSTATIN/NYSTOP) 100000 UNIT/GM POWD Apply 1 g topically 2 (two) times daily.    . OPANA ER,  CRUSH RESISTANT, 20 MG T12A Take 1 tablet by mouth 2 (two) times daily.    Marland Kitchen oxyCODONE-acetaminophen (PERCOCET) 10-325 MG per tablet Take 1 tablet by mouth every 6 (six) hours as needed. For pain.    . polyethylene glycol powder (GLYCOLAX/MIRALAX) powder Take 17 g by mouth daily as needed for mild constipation.     . potassium chloride SA (K-DUR,KLOR-CON) 20 MEQ tablet Take 40 mEq by mouth 4 (four) times daily.      . Rivaroxaban (XARELTO) 20 MG TABS Take 20 mg by mouth daily.    Marland Kitchen spironolactone (ALDACTONE) 50 MG tablet Take 50 mg by mouth 3 (three) times daily.      . tamsulosin (FLOMAX) 0.4 MG CAPS  capsule Take 0.4 mg by mouth daily.     No current facility-administered medications for this visit.    Allergies:  Hyoscyamine sulfate and Metoclopramide hcl   Social History: The patient  reports that she quit smoking about 7 years ago. Her smoking use included Cigarettes. She started smoking about 37 years ago. She has a 45 pack-year smoking history. She has never used smokeless tobacco. She reports that she does not drink alcohol or use illicit drugs.   Family History: The patient's family history includes Arrhythmia in her brother, father, and mother; Cancer in her mother and sister; Colon cancer in her maternal grandfather, paternal aunt, and paternal aunt; Coronary artery disease in her father; Dementia in her mother; Diabetes in her father and son; Heart attack in her father; Parkinson's disease in her father and mother; Stroke in her mother.   ROS:  Please see the history of present illness. Otherwise, complete review of systems is positive for fatigue.  All other systems are reviewed and negative.   Physical Exam: VS:  BP 118/76 mmHg  Pulse 81  Ht 5\' 9"  (1.753 m)  Wt 284 lb (128.822 kg)  BMI 41.92 kg/m2  SpO2 98%, BMI Body mass index is 41.92 kg/(m^2).  Wt Readings from Last 3 Encounters:  04/22/14 284 lb (128.822 kg)  03/26/14 288 lb (130.636 kg)  03/20/14 288 lb 1.9 oz (130.69  kg)     Obese woman in no acute distress.  HEENT: Conjunctiva and lids normal, oropharynx clear.  Neck: No elevated JVP or carotid bruits.  Lungs: Clear to auscultation, nonlabored breathing at rest.  Cardiac: Irregularly irregular, no S3 or significant systolic murmur, no pericardial rub.  Abdomen: Soft, nontender, bowel sounds present.  Extremities: Mild edema, distal pulses 2+. Uses a cane to walk. Skin: Warm and dry. Musculoskeletal: No kyphosis. Neuropsychiatric: Alert and oriented 3, affect appropriate.   ECG: ECG is not ordered today.   Recent Labwork: 03/26/2014: BUN 13; Creatinine 0.55; Hemoglobin 11.9*; Platelets 346; Potassium 4.3; Sodium 137  No results found for: CHOL, TRIG, HDL, CHOLHDL, VLDL, LDLCALC, LDLDIRECT   Assessment and Plan:  1. Persistent, symptomatic atrial fibrillation. She is now on flecainide 100 mg twice daily, Cardizem CD 180 mg daily, and Xarelto 20 mg daily. We will plan to arrange a repeat attempt at electrical cardioversion in the first week of May at [facility]. I will have her hold Cardizem CD before the procedure to avoid post-cardioversion bradycardia. If we are not successful in restoring sinus rhythm, I will plan to refer her to the atrial fibrillation clinic to discuss other options for management.   2. History of recurrent chest pain with previously documented normal coronary arteries.  3. Essential hypertension, blood pressure well controlled today.  Current medicines were reviewed with the patient today.   Orders Placed This Encounter  Procedures  . Cardioversion (Bedside)  . EKG 12-Lead    Disposition: FU with me after cardioversion.   Signed, Satira Sark, MD, [facility] 04/22/2014 3:38 PM    [facility] [place], [place], [place] [address] Phone: [phone]; Fax: [phone]

## 2014-04-23 NOTE — Telephone Encounter (Signed)
No precert required 

## 2014-05-04 ENCOUNTER — Ambulatory Visit (HOSPITAL_COMMUNITY): Payer: Medicare Other | Admitting: Anesthesiology

## 2014-05-04 ENCOUNTER — Ambulatory Visit (HOSPITAL_COMMUNITY)
Admission: RE | Admit: 2014-05-04 | Discharge: 2014-05-04 | Disposition: A | Discharge status: Home / Self Care | Payer: Medicare Other | Source: Ambulatory Visit | Attending: Cardiology | Admitting: Cardiology

## 2014-05-04 ENCOUNTER — Encounter (HOSPITAL_COMMUNITY): Payer: Self-pay | Admitting: Anesthesiology

## 2014-05-04 ENCOUNTER — Encounter (HOSPITAL_COMMUNITY)
Admission: RE | Disposition: A | Discharge status: Home / Self Care | Payer: Self-pay | Source: Ambulatory Visit | Attending: Cardiology

## 2014-05-04 DIAGNOSIS — M1990 Unspecified osteoarthritis, unspecified site: Secondary | ICD-10-CM | POA: Insufficient documentation

## 2014-05-04 DIAGNOSIS — I10 Essential (primary) hypertension: Secondary | ICD-10-CM | POA: Diagnosis not present

## 2014-05-04 DIAGNOSIS — Z7901 Long term (current) use of anticoagulants: Secondary | ICD-10-CM | POA: Diagnosis not present

## 2014-05-04 DIAGNOSIS — E119 Type 2 diabetes mellitus without complications: Secondary | ICD-10-CM | POA: Insufficient documentation

## 2014-05-04 DIAGNOSIS — K219 Gastro-esophageal reflux disease without esophagitis: Secondary | ICD-10-CM | POA: Insufficient documentation

## 2014-05-04 DIAGNOSIS — J449 Chronic obstructive pulmonary disease, unspecified: Secondary | ICD-10-CM | POA: Insufficient documentation

## 2014-05-04 DIAGNOSIS — Z87891 Personal history of nicotine dependence: Secondary | ICD-10-CM | POA: Insufficient documentation

## 2014-05-04 DIAGNOSIS — I481 Persistent atrial fibrillation: Secondary | ICD-10-CM | POA: Diagnosis present

## 2014-05-04 DIAGNOSIS — M797 Fibromyalgia: Secondary | ICD-10-CM | POA: Diagnosis not present

## 2014-05-04 HISTORY — PX: CARDIOVERSION: SHX1299

## 2014-05-04 LAB — CBC WITH DIFFERENTIAL/PLATELET
Basophils Absolute: 0 10*3/uL (ref 0.0–0.1)
Basophils Relative: 0 % (ref 0–1)
Eosinophils Absolute: 0.2 10*3/uL (ref 0.0–0.7)
Eosinophils Relative: 2 % (ref 0–5)
HCT: 38.6 % (ref 36.0–46.0)
Hemoglobin: 12.2 g/dL (ref 12.0–15.0)
LYMPHS ABS: 2.3 10*3/uL (ref 0.7–4.0)
LYMPHS PCT: 26 % (ref 12–46)
MCH: 25.1 pg — ABNORMAL LOW (ref 26.0–34.0)
MCHC: 31.6 g/dL (ref 30.0–36.0)
MCV: 79.4 fL (ref 78.0–100.0)
Monocytes Absolute: 0.6 10*3/uL (ref 0.1–1.0)
Monocytes Relative: 7 % (ref 3–12)
Neutro Abs: 5.6 10*3/uL (ref 1.7–7.7)
Neutrophils Relative %: 65 % (ref 43–77)
Platelets: 310 10*3/uL (ref 150–400)
RBC: 4.86 MIL/uL (ref 3.87–5.11)
RDW: 16.6 % — AB (ref 11.5–15.5)
WBC: 8.7 10*3/uL (ref 4.0–10.5)

## 2014-05-04 LAB — BASIC METABOLIC PANEL
ANION GAP: 8 (ref 5–15)
BUN: 13 mg/dL (ref 6–20)
CALCIUM: 9.4 mg/dL (ref 8.9–10.3)
CHLORIDE: 105 mmol/L (ref 101–111)
CO2: 24 mmol/L (ref 22–32)
Creatinine, Ser: 0.47 mg/dL (ref 0.44–1.00)
Glucose, Bld: 109 mg/dL — ABNORMAL HIGH (ref 70–99)
Potassium: 4.2 mmol/L (ref 3.5–5.1)
SODIUM: 137 mmol/L (ref 135–145)

## 2014-05-04 LAB — GLUCOSE, CAPILLARY: Glucose-Capillary: 108 mg/dL — ABNORMAL HIGH (ref 70–99)

## 2014-05-04 SURGERY — CARDIOVERSION
Anesthesia: Monitor Anesthesia Care

## 2014-05-04 MED ORDER — SUCCINYLCHOLINE CHLORIDE 20 MG/ML IJ SOLN
INTRAMUSCULAR | Status: AC
  Filled 2014-05-04: qty 1

## 2014-05-04 MED ORDER — PROPOFOL 10 MG/ML IV BOLUS
INTRAVENOUS | Status: AC
  Filled 2014-05-04: qty 20

## 2014-05-04 MED ORDER — ALBUTEROL SULFATE (2.5 MG/3ML) 0.083% IN NEBU
2.5000 mg | INHALATION_SOLUTION | Freq: Once | RESPIRATORY_TRACT | Status: AC
  Administered 2014-05-04: 2.5 mg via RESPIRATORY_TRACT

## 2014-05-04 MED ORDER — LIDOCAINE HCL (PF) 1 % IJ SOLN
INTRAMUSCULAR | Status: AC
  Filled 2014-05-04: qty 5

## 2014-05-04 MED ORDER — ONDANSETRON HCL 4 MG/2ML IJ SOLN
INTRAMUSCULAR | Status: AC
  Filled 2014-05-04: qty 2

## 2014-05-04 MED ORDER — FENTANYL CITRATE (PF) 100 MCG/2ML IJ SOLN: 25.0000 ug | INTRAMUSCULAR | Status: DC | PRN

## 2014-05-04 MED ORDER — ALBUTEROL SULFATE (2.5 MG/3ML) 0.083% IN NEBU
INHALATION_SOLUTION | RESPIRATORY_TRACT | Status: AC
  Filled 2014-05-04: qty 3

## 2014-05-04 MED ORDER — ONDANSETRON HCL 4 MG/2ML IJ SOLN
4.0000 mg | Freq: Once | INTRAMUSCULAR | Status: AC
  Administered 2014-05-04: 4 mg via INTRAVENOUS

## 2014-05-04 MED ORDER — FENTANYL CITRATE (PF) 100 MCG/2ML IJ SOLN
25.0000 ug | INTRAMUSCULAR | Status: AC
  Administered 2014-05-04 (×2): 25 ug via INTRAVENOUS

## 2014-05-04 MED ORDER — FENTANYL CITRATE (PF) 100 MCG/2ML IJ SOLN
INTRAMUSCULAR | Status: AC
  Filled 2014-05-04: qty 2

## 2014-05-04 MED ORDER — MIDAZOLAM HCL 2 MG/2ML IJ SOLN
INTRAMUSCULAR | Status: AC
  Filled 2014-05-04: qty 2

## 2014-05-04 MED ORDER — LACTATED RINGERS IV SOLN
INTRAVENOUS | Status: DC
  Administered 2014-05-04: 10:00:00 via INTRAVENOUS

## 2014-05-04 MED ORDER — MIDAZOLAM HCL 5 MG/5ML IJ SOLN
INTRAMUSCULAR | Status: DC | PRN
Start: 2014-05-04 — End: 2014-05-04
  Administered 2014-05-04: 2 mg via INTRAVENOUS

## 2014-05-04 MED ORDER — LIDOCAINE HCL (CARDIAC) 20 MG/ML IV SOLN
INTRAVENOUS | Status: DC | PRN
  Administered 2014-05-04: 50 mg via INTRAVENOUS

## 2014-05-04 MED ORDER — ONDANSETRON HCL 4 MG/2ML IJ SOLN: 4.0000 mg | Freq: Once | INTRAMUSCULAR | Status: DC | PRN

## 2014-05-04 MED ORDER — PROPOFOL INFUSION 10 MG/ML OPTIME
INTRAVENOUS | Status: DC | PRN
  Administered 2014-05-04: 150 ug/kg/min via INTRAVENOUS

## 2014-05-04 NOTE — Anesthesia Preprocedure Evaluation (Signed)
Anesthesia Evaluation  Patient identified by MRN, date of birth, ID band Patient awake    Reviewed: Allergy & Precautions, NPO status , Patient's Chart, lab work & pertinent test results  Airway Mallampati: II  TM Distance: <3 FB   Mouth opening: Limited Mouth Opening  Dental  (+) Partial Upper   Pulmonary sleep apnea , COPDformer smoker,  breath sounds clear to auscultation        Cardiovascular hypertension, Pt. on medications + dysrhythmias Atrial Fibrillation Rhythm:Irregular Rate:Normal     Neuro/Psych    GI/Hepatic GERD-  Medicated and Controlled,  Endo/Other  diabetes, Well Controlled, Type 2, Oral Hypoglycemic AgentsMorbid obesity  Renal/GU      Musculoskeletal  (+) Arthritis -, Fibromyalgia -  Abdominal   Peds  Hematology   Anesthesia Other Findings   Reproductive/Obstetrics                             Anesthesia Physical Anesthesia Plan  ASA: III  Anesthesia Plan: MAC   Post-op Pain Management:    Induction: Intravenous  Airway Management Planned: Simple Face Mask  Additional Equipment:   Intra-op Plan:   Post-operative Plan:   Informed Consent: I have reviewed the patients History and Physical, chart, labs and discussed the procedure including the risks, benefits and alternatives for the proposed anesthesia with the patient or authorized representative who has indicated his/her understanding and acceptance.     Plan Discussed with:   Anesthesia Plan Comments:         Anesthesia Quick Evaluation

## 2014-05-04 NOTE — Anesthesia Postprocedure Evaluation (Signed)
  Anesthesia Post-op Note  Patient: [name] [name]  Procedure(s) Performed: Procedure(s): CARDIOVERSION (N/A)  Patient Location: PACU  Anesthesia Type:MAC  Level of Consciousness: awake, alert , oriented and patient cooperative  Airway and Oxygen Therapy: Patient Spontanous Breathing and Patient connected to face mask oxygen  Post-op Pain: none  Post-op Assessment: Post-op Vital signs reviewed, Patient's Cardiovascular Status Stable, Respiratory Function Stable, Patent Airway, No signs of Nausea or vomiting and Pain level controlled  Post-op Vital Signs: Reviewed and stable  Last Vitals:  Filed Vitals:   05/04/14 1049  BP:   Pulse:   Resp: 19    Complications: No apparent anesthesia complications

## 2014-05-04 NOTE — CV Procedure (Signed)
Elective direct current cardioversion  Indication: Persistent symptomatic atrial fibrillation  Consent signed, time out completed. Sedation provided by Anesthesia service (see record for details). Biphasic defibrillator utilized with delivery of 120J synchronized shock via anterior/posterior pad placement and anterior sandbag weight in place. Atrial fibrillation persisted and a second synchronized shock at 200J was delivered with subsequent conversion to sinus rhythm with prolonged PR interval and some Wenkebach conduction. Patient tolerated well without obvious immediate complications.  Satira Sark, M.D., F.A.C.C.

## 2014-05-04 NOTE — Discharge Instructions (Signed)

## 2014-05-04 NOTE — Progress Notes (Signed)
Electrical Cardioversion Procedure Note [name] [name] [id number] June 24, 1960  Procedure: Electrical Cardioversion Indications: atrial fibrillation  Procedure Details Consent: elective cardioversion Time Out: Verified patient identification, verified procedure, site/side was marked, verified correct patient position, special equipment/implants available, medications/allergies/relevent history reviewed, required imaging and test results available. Dr. Domenic Polite, Dr. Patsey Berthold, A. Adams CRNA, Richarda Osmond RN [phone]  Patient placed on cardiac monitor, pulse oximetry, supplemental oxygen as necessary.  Sedation given: anesthesia  Pacer pads placed anterior and posterior chest with added 5 lb sand bag for added weight.  Cardioverted 2 time(s).  Cardioverted at 1039 with 120 jules then @ 1040 @ 200 jules with sinus rhythm obtained..  Evaluation Findings: Post procedure EKG shows:sinus rhythm with 2nd degree block  Complications: n/a Patient did tolerate procedure well. Pad sites pink. Patient & family spoke with Dr. Domenic Polite following procedure. Appointment to be made for followup with the cardiology office.   [name] [name] 05/04/2014, 10:57 AM

## 2014-05-04 NOTE — Transfer of Care (Signed)
Immediate Anesthesia Transfer of Care Note  Patient: [name] [name]  Procedure(s) Performed: Procedure(s): CARDIOVERSION (N/A)  Patient Location: PACU  Anesthesia Type:MAC  Level of Consciousness: awake, alert , oriented and patient cooperative  Airway & Oxygen Therapy: Patient Spontanous Breathing and Patient connected to face mask oxygen  Post-op Assessment: Report given to RN and Post -op Vital signs reviewed and stable  Post vital signs: Reviewed and stable  Last Vitals:  Filed Vitals:   05/04/14 1025  BP: 127/72  Pulse:   Resp: 24    Complications: No apparent anesthesia complications

## 2014-05-04 NOTE — H&P (Signed)
Cardiology Office Note  Date: 04/22/2014   ID: [name] [name], DOB 02/18/1960, MRN [id number]  PCP: Glenda Chroman., MD Primary Cardiologist: Rozann Lesches, MD   Chief Complaint  Patient presents with  . Atrial Fibrillation  . Chest Pain    History of Present Illness: [name] [name] is a 54 y.o. female just recently seen in the office in early April at which time flecainide dose was increased with plan to consider follow-up cardioversion of persistent atrial fibrillation following a recent failed cardioversion attempt. She also had a follow-up cardiac monitor arranged to better assess heart rate variability. Monitor showed average heart rate in atrial fibrillation of 79 with peak up to the 130s and some bradycardia, although no pauses greater than 3 seconds. I reviewed these results with her today.  Phone call to nursing noted yesterday reporting recent use of nitroglycerin and persistent shortness of breath with activity in atrial fibrillation. She was placed on the schedule for today. Cardiac history is reviewed below including previous documentation of normal coronary arteries as well as esophageal spasm.  She remains in atrial fibrillation today. We have discussed arranging a follow-up attempt at cardioversion on higher dose flecainide to see if we can restore sinus rhythm. She had held sinus rhythm previously and was symptomatically improved for several months. Otherwise, I will plan to refer her to the atrial fibrillation clinic.   Past Medical History  Diagnosis Date  . Atrial fibrillation     Paroxysmal  . Essential hypertension, benign   . Type 2 diabetes mellitus   . History of cardiac catheterization     Normal coronary arteries 11/12  . Esophageal spasm     NTG and Norvasc  . Fibromyalgia   . Irritable bowel syndrome   . COPD (chronic obstructive pulmonary disease)   . Lymphedema   . GERD (gastroesophageal  reflux disease)   . DDD (degenerative disc disease)   . Osteoarthritis   . Gastroparesis   . Urinary tract infection     Past Surgical History  Procedure Laterality Date  . Back surgery    . Cholecystectomy    . Appendectomy    . Abdominal hysterectomy    . Lung biopsy    . Knee surgery    . Esophagogastroduodenoscopy  10/12/2004    [id number] plaquing on the esophageal mucosa of uncertain significance, not typical of what is seen with candida esophagitis status post KOH brushing for KOH prep and biopsy for histology. Rule out candida esophagitis/eosinophilic esophagitis. Otherwise normal esophagus. Tiny hiatal hernia. Otherwise, normal stomach, normal D1 and D2. Benign biopsy of esophagus, unknown KOH status.   . Colonoscopy      Approximately 2003. Per medical records, internal hemorrhoids noted  . Colonoscopy N/A 06/25/2013    Dr. Gala Romney: Anal canal hemorrhoids-more likely the source of paper hematochezia. Redundant, capacious colon. Multiple colonic polyps-tubular adenoma  . Esophagogastroduodenoscopy N/A 06/25/2013    Dr. Rourk:mild chronic gastritis  . Left heart catheterization with coronary angiogram N/A 11/15/2010    Procedure: LEFT HEART CATHETERIZATION WITH CORONARY ANGIOGRAM; Surgeon: Laverda Page, MD; Location: [facility] CATH LAB; Service: Cardiovascular; Laterality: N/A;  . Cardioversion N/A 03/26/2014    Procedure: CARDIOVERSION; Surgeon: Herminio Commons, MD; Location: [facility]; Service: Endoscopy; Laterality: N/A;    Current Outpatient Prescriptions  Medication Sig Dispense Refill  . albuterol (PROAIR HFA) 108 (90 BASE) MCG/ACT inhaler Inhale 2 puffs into the lungs every 6 (six) hours as needed for wheezing or shortness of breath.     Marland Kitchen  albuterol (PROVENTIL) (2.5 MG/3ML) 0.083% nebulizer solution Take 2.5 mg by nebulization every 6 (six) hours as needed. For shortness  of breath.     Marland Kitchen amoxicillin (AMOXIL) 500 MG capsule As needed prior to dental work    . baclofen (LIORESAL) 20 MG tablet Take 20 mg by mouth 3 (three) times daily.     Marland Kitchen buPROPion (WELLBUTRIN) 75 MG tablet Take 75 mg by mouth daily.     . diazepam (VALIUM) 5 MG tablet Take 5 mg by mouth every 12 (twelve) hours as needed. For anxiety.    . diclofenac sodium (VOLTAREN) 1 % GEL Apply 2-4 g topically 4 (four) times daily.     Marland Kitchen dicyclomine (BENTYL) 10 MG capsule Take 1 capsule (10 mg total) by mouth 4 (four) times daily - before meals and at bedtime. For abdominal cramping and loose stools 120 capsule 5  . diltiazem (CARDIZEM CD) 180 MG 24 hr capsule Take 1 capsule (180 mg total) by mouth daily. 90 capsule 3  . esomeprazole (NEXIUM) 40 MG capsule Take 40 mg by mouth daily before breakfast.    . flecainide (TAMBOCOR) 50 MG tablet Take 2 tablets (100 mg total) by mouth 2 (two) times daily. 120 tablet 1  . furosemide (LASIX) 80 MG tablet Take 80 mg by mouth 2 (two) times daily.     Marland Kitchen gabapentin (NEURONTIN) 600 MG tablet Take 600 mg by mouth 4 (four) times daily.    . hydrocortisone (PROCTOSOL HC) 2.5 % rectal cream Place 1 application rectally 2 (two) times daily. 30 g 0  . hydrOXYzine (ATARAX/VISTARIL) 25 MG tablet Take 25 mg by mouth 2 (two) times daily as needed for anxiety or itching.     . isosorbide mononitrate (IMDUR) 120 MG 24 hr tablet Take 120 mg by mouth every morning.     . isosorbide mononitrate (IMDUR) 30 MG 24 hr tablet Take 30 mg by mouth every evening.     Marland Kitchen lisinopril (PRINIVIL,ZESTRIL) 2.5 MG tablet Take 2.5 mg by mouth at bedtime.     Marland Kitchen loratadine (CLARITIN) 10 MG tablet Take 10 mg by mouth daily.     . meclizine (ANTIVERT) 25 MG tablet Take 25 mg by mouth 2 (two) times daily as needed.     . metFORMIN (GLUCOPHAGE) 500 MG tablet Take 500 mg by mouth daily.    . nitroGLYCERIN  (NITROLINGUAL) 0.4 MG/SPRAY spray Place 1 spray under the tongue as needed. Chest pain    . NON FORMULARY BIPAP - STARTED ABOUT 1 1/2 years AGO.    Marland Kitchen nystatin (MYCOSTATIN/NYSTOP) 100000 UNIT/GM POWD Apply 1 g topically 2 (two) times daily.    . OPANA ER, CRUSH RESISTANT, 20 MG T12A Take 1 tablet by mouth 2 (two) times daily.    Marland Kitchen oxyCODONE-acetaminophen (PERCOCET) 10-325 MG per tablet Take 1 tablet by mouth every 6 (six) hours as needed. For pain.    . polyethylene glycol powder (GLYCOLAX/MIRALAX) powder Take 17 g by mouth daily as needed for mild constipation.     . potassium chloride SA (K-DUR,KLOR-CON) 20 MEQ tablet Take 40 mEq by mouth 4 (four) times daily.     . Rivaroxaban (XARELTO) 20 MG TABS Take 20 mg by mouth daily.    Marland Kitchen spironolactone (ALDACTONE) 50 MG tablet Take 50 mg by mouth 3 (three) times daily.     . tamsulosin (FLOMAX) 0.4 MG CAPS capsule Take 0.4 mg by mouth daily.     No current facility-administered medications for this visit.  Allergies: Hyoscyamine sulfate and Metoclopramide hcl   Social History: The patient  reports that she quit smoking about 7 years ago. Her smoking use included Cigarettes. She started smoking about 37 years ago. She has a 45 pack-year smoking history. She has never used smokeless tobacco. She reports that she does not drink alcohol or use illicit drugs.   Family History: The patient's family history includes Arrhythmia in her brother, father, and mother; Cancer in her mother and sister; Colon cancer in her maternal grandfather, paternal aunt, and paternal aunt; Coronary artery disease in her father; Dementia in her mother; Diabetes in her father and son; Heart attack in her father; Parkinson's disease in her father and mother; Stroke in her mother.   ROS: Please see the history of present illness. Otherwise, complete review of systems is positive for fatigue. All other systems are reviewed and  negative.   Physical Exam: VS: BP 118/76 mmHg  Pulse 81  Ht 5\' 9"  (1.753 m)  Wt 284 lb (128.822 kg)  BMI 41.92 kg/m2  SpO2 98%, BMI Body mass index is 41.92 kg/(m^2).  Wt Readings from Last 3 Encounters:  04/22/14 284 lb (128.822 kg)  03/26/14 288 lb (130.636 kg)  03/20/14 288 lb 1.9 oz (130.69 kg)     Obese woman in no acute distress.  HEENT: Conjunctiva and lids normal, oropharynx clear.  Neck: No elevated JVP or carotid bruits.  Lungs: Clear to auscultation, nonlabored breathing at rest.  Cardiac: Irregularly irregular, no S3 or significant systolic murmur, no pericardial rub.  Abdomen: Soft, nontender, bowel sounds present.  Extremities: Mild edema, distal pulses 2+. Uses a cane to walk. Skin: Warm and dry. Musculoskeletal: No kyphosis. Neuropsychiatric: Alert and oriented 3, affect appropriate.   ECG: ECG is not ordered today.   Recent Labwork: 03/26/2014: BUN 13; Creatinine 0.55; Hemoglobin 11.9*; Platelets 346; Potassium 4.3; Sodium 137   Labs (Brief)    No results found for: CHOL, TRIG, HDL, CHOLHDL, VLDL, LDLCALC, LDLDIRECT     Assessment and Plan:  1. Persistent, symptomatic atrial fibrillation. She is now on flecainide 100 mg twice daily, Cardizem CD 180 mg daily, and Xarelto 20 mg daily. We will plan to arrange a repeat attempt at electrical cardioversion in the first week of May at [facility]. I will have her hold Cardizem CD before the procedure to avoid post-cardioversion bradycardia. If we are not successful in restoring sinus rhythm, I will plan to refer her to the atrial fibrillation clinic to discuss other options for management.   2. History of recurrent chest pain with previously documented normal coronary arteries.  3. Essential hypertension, blood pressure well controlled today.  Current medicines were reviewed with the patient today.  Orders Placed This Encounter  Procedures  . Cardioversion (Bedside)  . EKG 12-Lead     Disposition: FU with me after cardioversion.   Signed, Satira Sark, MD, [facility]       Addendum:  Patient presents for repeat DCCV attempt with symptomatic atrial fibrillation as noted above. No major interval change in history except some recent chest pain with stress (mother taken to hospital last night). Stable without chest pain today and ECG without change. She is ready to proceed.  Satira Sark, M.D., F.A.C.C.

## 2014-05-05 ENCOUNTER — Encounter (HOSPITAL_COMMUNITY): Payer: Self-pay | Admitting: Cardiology

## 2014-05-12 ENCOUNTER — Encounter: Payer: Self-pay | Admitting: Cardiology

## 2014-05-12 ENCOUNTER — Ambulatory Visit: Payer: Medicare Other | Admitting: Cardiology

## 2014-05-12 ENCOUNTER — Ambulatory Visit (INDEPENDENT_AMBULATORY_CARE_PROVIDER_SITE_OTHER): Payer: Medicare Other | Admitting: Cardiology

## 2014-05-12 VITALS — BP 117/73 | HR 60 | Ht 69.0 in | Wt 276.0 lb

## 2014-05-12 DIAGNOSIS — I480 Paroxysmal atrial fibrillation: Secondary | ICD-10-CM | POA: Diagnosis not present

## 2014-05-12 DIAGNOSIS — R072 Precordial pain: Secondary | ICD-10-CM | POA: Diagnosis not present

## 2014-05-12 DIAGNOSIS — I10 Essential (primary) hypertension: Secondary | ICD-10-CM

## 2014-05-12 MED ORDER — DILTIAZEM HCL ER COATED BEADS 120 MG PO CP24: 120.0000 mg | ORAL_CAPSULE | Freq: Every day | ORAL | Status: DC

## 2014-05-12 NOTE — Patient Instructions (Signed)
Your physician recommends that you schedule a follow-up appointment in: 3 months with Dr. Domenic Polite  Your physician has recommended you make the following change in your medication:   DECREASE CARDIZEM 120 MG DAILY'  CONTINUE ALL OTHER MEDICATIONS AS DIRECTED  Thank you for choosing [facility]!!

## 2014-05-12 NOTE — Progress Notes (Signed)
Cardiology Office Note  Date: 05/12/2014   ID: SHAY JHAVERI, DOB 02-04-1960, MRN [id number]  PCP: Glenda Chroman., MD  Primary Cardiologist: Rozann Lesches, MD   Chief Complaint  Patient presents with  . Follow-up cardioversion    History of Present Illness: [name] [name] is a 54 y.o. female last seen in the office in April, and referred for a follow-up cardioversion attempt for symptomatic atrial fibrillation (initially unsuccessful on March 24). Flecainide dose was increased and she underwent repeat cardioversion on May 2 which was successful in restoring sinus rhythm. Subsequent rhythm was sinus with prolonged PR interval and some intermittent second-degree heart block which was predominantly Wenkebach conduction.   She presents today for a follow-up visit. She indicates less of a sense of palpitations and exertional dyspnea. Continues to have atypical chest pains although less frequent. Follow-up ECG today confirms sinus rhythm. She still has a prolonged PR interval with evidence of conduction system disease, however no higher degree heart block.  We discussed reducing her Cardizem CD dose, otherwise continuing observation on remaining regimen.   Past Medical History  Diagnosis Date  . Atrial fibrillation     Paroxysmal  . Essential hypertension, benign   . Type 2 diabetes mellitus   . History of cardiac catheterization     Normal coronary arteries 11/12  . Esophageal spasm     NTG and Norvasc  . Fibromyalgia   . Irritable bowel syndrome   . COPD (chronic obstructive pulmonary disease)   . Lymphedema   . GERD (gastroesophageal reflux disease)   . DDD (degenerative disc disease)   . Osteoarthritis   . Gastroparesis   . Urinary tract infection      Current Outpatient Prescriptions  Medication Sig Dispense Refill  . albuterol (PROAIR HFA) 108 (90 BASE) MCG/ACT inhaler Inhale 2 puffs into the lungs every 6 (six) hours as needed for wheezing or shortness of breath.      Marland Kitchen albuterol (PROVENTIL) (2.5 MG/3ML) 0.083% nebulizer solution Take 2.5 mg by nebulization every 6 (six) hours as needed. For shortness of breath.     Marland Kitchen amoxicillin (AMOXIL) 500 MG capsule As needed prior to dental work    . baclofen (LIORESAL) 20 MG tablet Take 20 mg by mouth 3 (three) times daily.      Marland Kitchen buPROPion (WELLBUTRIN) 75 MG tablet Take 75 mg by mouth daily.     . diazepam (VALIUM) 5 MG tablet Take 5 mg by mouth every 12 (twelve) hours as needed. For anxiety.    . diclofenac sodium (VOLTAREN) 1 % GEL Apply 2-4 g topically 4 (four) times daily.     Marland Kitchen dicyclomine (BENTYL) 10 MG capsule Take 1 capsule (10 mg total) by mouth 4 (four) times daily -  before meals and at bedtime. For abdominal cramping and loose stools 120 capsule 5  . diltiazem (CARDIZEM CD) 180 MG 24 hr capsule Take 1 capsule (180 mg total) by mouth daily. 90 capsule 3  . esomeprazole (NEXIUM) 40 MG capsule Take 40 mg by mouth daily before breakfast.    . flecainide (TAMBOCOR) 50 MG tablet Take 2 tablets (100 mg total) by mouth 2 (two) times daily. 120 tablet 1  . furosemide (LASIX) 80 MG tablet Take 80 mg by mouth 2 (two) times daily.      Marland Kitchen gabapentin (NEURONTIN) 600 MG tablet Take 600 mg by mouth 4 (four) times daily.    . hydrocortisone (PROCTOSOL HC) 2.5 % rectal cream Place 1 application rectally  2 (two) times daily. 30 g 0  . hydrOXYzine (ATARAX/VISTARIL) 25 MG tablet Take 25 mg by mouth 2 (two) times daily as needed for anxiety or itching.     . isosorbide mononitrate (IMDUR) 120 MG 24 hr tablet Take 120 mg by mouth every morning.     . isosorbide mononitrate (IMDUR) 30 MG 24 hr tablet Take 30 mg by mouth every evening.     Marland Kitchen lisinopril (PRINIVIL,ZESTRIL) 2.5 MG tablet Take 2.5 mg by mouth at bedtime.     Marland Kitchen loratadine (CLARITIN) 10 MG tablet Take 10 mg by mouth daily.      . meclizine (ANTIVERT) 25 MG tablet Take 25 mg by mouth 2 (two) times daily as needed.     . metFORMIN (GLUCOPHAGE) 500 MG tablet Take 500 mg  by mouth daily.    . nitroGLYCERIN (NITROLINGUAL) 0.4 MG/SPRAY spray Place 1 spray under the tongue as needed. Chest pain    . NON FORMULARY BIPAP - STARTED ABOUT 1 1/2 years AGO.    Marland Kitchen nystatin (MYCOSTATIN/NYSTOP) 100000 UNIT/GM POWD Apply 1 g topically 2 (two) times daily.    . OPANA ER, CRUSH RESISTANT, 20 MG T12A Take 1 tablet by mouth 2 (two) times daily.    Marland Kitchen oxyCODONE-acetaminophen (PERCOCET) 10-325 MG per tablet Take 1 tablet by mouth every 6 (six) hours as needed. For pain.    . polyethylene glycol powder (GLYCOLAX/MIRALAX) powder Take 17 g by mouth daily as needed for mild constipation.     . potassium chloride SA (K-DUR,KLOR-CON) 20 MEQ tablet Take 40 mEq by mouth 4 (four) times daily.      . Rivaroxaban (XARELTO) 20 MG TABS Take 20 mg by mouth daily.    Marland Kitchen spironolactone (ALDACTONE) 50 MG tablet Take 50 mg by mouth 3 (three) times daily.      . tamsulosin (FLOMAX) 0.4 MG CAPS capsule Take 0.4 mg by mouth daily.     No current facility-administered medications for this visit.    Allergies:  Hyoscyamine sulfate and Metoclopramide hcl   Social History: The patient  reports that she quit smoking about 7 years ago. Her smoking use included Cigarettes. She started smoking about 37 years ago. She has a 45 pack-year smoking history. She has never used smokeless tobacco. She reports that she does not drink alcohol or use illicit drugs.   ROS:  Please see the history of present illness. Otherwise, complete review of systems is positive for chronic knee pain.  All other systems are reviewed and negative.   Physical Exam: VS:  BP 117/73 mmHg  Pulse 60  Ht 5\' 9"  (1.753 m)  Wt 276 lb (125.193 kg)  BMI 40.74 kg/m2  SpO2 98%, BMI Body mass index is 40.74 kg/(m^2).  Wt Readings from Last 3 Encounters:  05/12/14 276 lb (125.193 kg)  05/04/14 284 lb (128.822 kg)  04/22/14 284 lb (128.822 kg)     Obese woman in no acute distress.  HEENT: Conjunctiva and lids normal, oropharynx clear.   Neck: No elevated JVP or carotid bruits.  Lungs: Clear to auscultation, nonlabored breathing at rest.  Cardiac: RRR, no S3 or significant systolic murmur, no pericardial rub.  Abdomen: Soft, nontender, bowel sounds present.  Extremities: Mild edema, distal pulses 2+. Uses a cane to walk. Skin: Warm and dry.   ECG: ECG is ordered today and reviewed showing sinus bradycardia with prolonged PR interval 262 ms, incomplete right bundle branch block and left anterior fascicular block.   Recent Labwork: 05/04/2014: BUN  13; Creatinine 0.47; Hemoglobin 12.2; Platelets 310; Potassium 4.2; Sodium 137    Assessment and Plan:  1. Symptomatic paroxysmal atrial fibrillation, maintaining sinus rhythm after recent cardioversion. Plan to reduce Cardizem CD to from 180 mg to 120 mg daily. She does have evidence of conduction system disease by ECG in sinus rhythm. Otherwise continue flecainide and Xarelto.  2. Essential hypertension, blood pressure normal today.  3. History of recurrent, atypical chest pain. Normal coronary arteries by cardiac catheterization in 2012. Follow-up Lexiscan Cardiolite in 2014 showed no clear evidence of ischemia. She does use nitroglycerin, possibly endothelial dysfunction.  Current medicines were reviewed with the patient today.   Orders Placed This Encounter  Procedures  . EKG 12-Lead    Disposition: FU with me in 3 months.   Signed, Satira Sark, MD, [facility] 05/12/2014 4:26 PM    [facility] [place], [place], [place] [address] Phone: [phone]; Fax: [phone]

## 2014-05-29 ENCOUNTER — Other Ambulatory Visit: Payer: Self-pay | Admitting: Cardiology

## 2014-07-08 ENCOUNTER — Other Ambulatory Visit: Payer: Self-pay | Admitting: *Deleted

## 2014-07-08 MED ORDER — NITROGLYCERIN 0.4 MG/SPRAY TL SOLN
1.0000 | Status: DC | PRN
Start: 2014-07-08 — End: 2015-06-18

## 2014-08-18 ENCOUNTER — Encounter: Payer: Self-pay | Admitting: Cardiology

## 2014-08-18 ENCOUNTER — Ambulatory Visit (INDEPENDENT_AMBULATORY_CARE_PROVIDER_SITE_OTHER): Payer: Medicare Other | Admitting: Cardiology

## 2014-08-18 VITALS — BP 102/58 | HR 71 | Ht 69.0 in | Wt 288.0 lb

## 2014-08-18 DIAGNOSIS — R072 Precordial pain: Secondary | ICD-10-CM

## 2014-08-18 DIAGNOSIS — I480 Paroxysmal atrial fibrillation: Secondary | ICD-10-CM

## 2014-08-18 DIAGNOSIS — I10 Essential (primary) hypertension: Secondary | ICD-10-CM | POA: Diagnosis not present

## 2014-08-18 NOTE — Patient Instructions (Signed)
Your physician recommends that you continue on your current medications as directed. Please refer to the Current Medication list given to you today. Your physician recommends that you schedule a follow-up appointment in: 4 months. You will receive a reminder letter in the mail in about 1-2 months reminding you to call and schedule your appointment. If you don't receive this letter, please contact our office. 

## 2014-08-18 NOTE — Progress Notes (Signed)
Cardiology Office Note  Date: 08/18/2014   ID: [name] [name], DOB 05-04-1960, MRN [id number]  PCP: Glenda Chroman., MD  Primary Cardiologist: Rozann Lesches, MD   Chief Complaint  Patient presents with  . Atrial Fibrillation    History of Present Illness: [name] [name] is a 54 y.o. female last seen in May. At that time she was maintaining sinus rhythm, Cardizem CD was reduced to 120 mg daily, and she otherwise continued on flecainide and Xarelto. She presents today for a routine follow-up visit, reports occasional brief palpitations, but remains in sinus rhythm. Follow-up ECG reviewed.  She tells me that her mother passed away in the last few weeks after a prolonged illness. Although she was expecting this to some degree, she is still obviously undergoing appropriate grieving adjustment.  We reviewed her cardiac medications which are unchanged. She denies having any bleeding problems on Xarelto.   Past Medical History  Diagnosis Date  . Atrial fibrillation     Paroxysmal  . Essential hypertension, benign   . Type 2 diabetes mellitus   . History of cardiac catheterization     Normal coronary arteries 11/12  . Esophageal spasm     NTG and Norvasc  . Fibromyalgia   . Irritable bowel syndrome   . COPD (chronic obstructive pulmonary disease)   . Lymphedema   . GERD (gastroesophageal reflux disease)   . DDD (degenerative disc disease)   . Osteoarthritis   . Gastroparesis   . Urinary tract infection      Current Outpatient Prescriptions  Medication Sig Dispense Refill  . albuterol (PROAIR HFA) 108 (90 BASE) MCG/ACT inhaler Inhale 2 puffs into the lungs every 6 (six) hours as needed for wheezing or shortness of breath.     Marland Kitchen albuterol (PROVENTIL) (2.5 MG/3ML) 0.083% nebulizer solution Take 2.5 mg by nebulization every 6 (six) hours as needed. For shortness of breath.     Marland Kitchen amoxicillin (AMOXIL) 500 MG capsule As needed prior to dental work    . baclofen (LIORESAL) 20 MG  tablet Take 20 mg by mouth 3 (three) times daily.      Marland Kitchen buPROPion (WELLBUTRIN) 75 MG tablet Take 75 mg by mouth daily.     . diazepam (VALIUM) 5 MG tablet Take 5 mg by mouth every 12 (twelve) hours as needed. For anxiety.    . diclofenac sodium (VOLTAREN) 1 % GEL Apply 2-4 g topically 4 (four) times daily.     Marland Kitchen dicyclomine (BENTYL) 10 MG capsule Take 1 capsule (10 mg total) by mouth 4 (four) times daily -  before meals and at bedtime. For abdominal cramping and loose stools 120 capsule 5  . diltiazem (CARDIZEM CD) 120 MG 24 hr capsule Take 1 capsule (120 mg total) by mouth daily. 90 capsule 3  . esomeprazole (NEXIUM) 40 MG capsule Take 40 mg by mouth daily before breakfast.    . flecainide (TAMBOCOR) 50 MG tablet TAKE 2 TABLETS BY MOUTH TWICE DAILY 120 tablet 6  . furosemide (LASIX) 80 MG tablet Take 80 mg by mouth 2 (two) times daily.      Marland Kitchen gabapentin (NEURONTIN) 600 MG tablet Take 600 mg by mouth 4 (four) times daily.    . hydrocortisone (PROCTOSOL HC) 2.5 % rectal cream Place 1 application rectally 2 (two) times daily. 30 g 0  . hydrOXYzine (ATARAX/VISTARIL) 25 MG tablet Take 25 mg by mouth 2 (two) times daily as needed for anxiety or itching.     . isosorbide  mononitrate (IMDUR) 120 MG 24 hr tablet Take 120 mg by mouth every morning.     . isosorbide mononitrate (IMDUR) 30 MG 24 hr tablet Take 30 mg by mouth every evening.     Marland Kitchen lisinopril (PRINIVIL,ZESTRIL) 2.5 MG tablet Take 2.5 mg by mouth at bedtime.     Marland Kitchen loratadine (CLARITIN) 10 MG tablet Take 10 mg by mouth daily.      . meclizine (ANTIVERT) 25 MG tablet Take 25 mg by mouth 2 (two) times daily as needed.     . metFORMIN (GLUCOPHAGE) 500 MG tablet Take 500 mg by mouth daily.    . nitroGLYCERIN (NITROLINGUAL) 0.4 MG/SPRAY spray Place 1 spray under the tongue as needed. Chest pain 12 g 3  . NON FORMULARY BIPAP - STARTED ABOUT 1 1/2 years AGO.    Marland Kitchen nystatin (MYCOSTATIN/NYSTOP) 100000 UNIT/GM POWD Apply 1 g topically 2 (two) times daily.     . OPANA ER, CRUSH RESISTANT, 20 MG T12A Take 1 tablet by mouth 2 (two) times daily.    Marland Kitchen oxyCODONE-acetaminophen (PERCOCET) 10-325 MG per tablet Take 1 tablet by mouth every 6 (six) hours as needed. For pain.    . polyethylene glycol powder (GLYCOLAX/MIRALAX) powder Take 17 g by mouth daily as needed for mild constipation.     . potassium chloride SA (K-DUR,KLOR-CON) 20 MEQ tablet Take 40 mEq by mouth 4 (four) times daily.      . Rivaroxaban (XARELTO) 20 MG TABS Take 20 mg by mouth daily.    Marland Kitchen spironolactone (ALDACTONE) 50 MG tablet Take 50 mg by mouth 3 (three) times daily.       No current facility-administered medications for this visit.    Allergies:  Hyoscyamine sulfate and Metoclopramide hcl   Social History: The patient  reports that she quit smoking about 7 years ago. Her smoking use included Cigarettes. She started smoking about 37 years ago. She has a 45 pack-year smoking history. She has never used smokeless tobacco. She reports that she does not drink alcohol or use illicit drugs.   ROS:  Please see the history of present illness. Otherwise, complete review of systems is positive for chronic back pain, some left arm neuropathic paresthesias.  All other systems are reviewed and negative.   Physical Exam: VS:  BP 102/58 mmHg  Pulse 71  Ht 5\' 9"  (1.753 m)  Wt 288 lb (130.636 kg)  BMI 42.51 kg/m2  SpO2 96%, BMI Body mass index is 42.51 kg/(m^2).  Wt Readings from Last 3 Encounters:  08/18/14 288 lb (130.636 kg)  05/12/14 276 lb (125.193 kg)  05/04/14 284 lb (128.822 kg)     Obese woman in no acute distress.  HEENT: Conjunctiva and lids normal, oropharynx clear.  Neck: No elevated JVP or carotid bruits.  Lungs: Clear to auscultation, nonlabored breathing at rest.  Cardiac: RRR, no S3 or significant systolic murmur, no pericardial rub.  Abdomen: Soft, nontender, bowel sounds present.  Extremities: Mild edema, distal pulses 2+. Uses a cane to walk. Skin: Warm and  dry.   ECG: ECG is ordered today showing sinus rhythm with left anterior fascicular block, LVH with repolarization changes.   Recent Labwork: 05/04/2014: BUN 13; Creatinine, Ser 0.47; Hemoglobin 12.2; Platelets 310; Potassium 4.2; Sodium 137   Assessment and Plan:  1. Paroxysmal atrial fibrillation, maintaining sinus rhythm on flecainide following cardioversion earlier in the year. No changes were made to present regimen. ECG reviewed. QRS duration stable.  2. Essential hypertension, blood pressure is normal today.  3. History of recurrent chest pain with previously documented normal coronary arteries and follow-up low risk Cardiolite in 2014. Treating for possible endothelial dysfunction, she continues on Imdur.  Current medicines were reviewed with the patient today.   Orders Placed This Encounter  Procedures  . EKG 12-Lead    Disposition: FU with me in 4 months.   Signed, Satira Sark, MD, [facility] 08/18/2014 3:24 PM    [facility] [place], [place], [place] [address] Phone: [phone]; Fax: [phone]

## 2014-09-03 ENCOUNTER — Other Ambulatory Visit: Payer: Self-pay | Admitting: Anesthesiology

## 2014-09-03 DIAGNOSIS — M542 Cervicalgia: Secondary | ICD-10-CM

## 2014-09-13 ENCOUNTER — Ambulatory Visit
Admission: RE | Admit: 2014-09-13 | Discharge: 2014-09-13 | Disposition: A | Discharge status: Home / Self Care | Payer: Medicare Other | Source: Ambulatory Visit | Attending: Anesthesiology | Admitting: Anesthesiology

## 2014-09-13 DIAGNOSIS — M542 Cervicalgia: Secondary | ICD-10-CM

## 2014-09-28 ENCOUNTER — Other Ambulatory Visit: Payer: Self-pay | Admitting: Neurosurgery

## 2014-09-28 DIAGNOSIS — M48061 Spinal stenosis, lumbar region without neurogenic claudication: Secondary | ICD-10-CM

## 2014-10-01 ENCOUNTER — Ambulatory Visit
Admission: RE | Admit: 2014-10-01 | Discharge: 2014-10-01 | Disposition: A | Discharge status: Home / Self Care | Payer: Medicare Other | Source: Ambulatory Visit | Attending: Neurosurgery | Admitting: Neurosurgery

## 2014-10-01 DIAGNOSIS — M48061 Spinal stenosis, lumbar region without neurogenic claudication: Secondary | ICD-10-CM

## 2014-10-01 MED ORDER — GADOBENATE DIMEGLUMINE 529 MG/ML IV SOLN
20.0000 mL | Freq: Once | INTRAVENOUS | Status: AC | PRN
  Administered 2014-10-01: 20 mL via INTRAVENOUS

## 2014-10-06 ENCOUNTER — Other Ambulatory Visit: Payer: Self-pay | Admitting: Nurse Practitioner

## 2014-10-06 DIAGNOSIS — N6310 Unspecified lump in the right breast, unspecified quadrant: Secondary | ICD-10-CM

## 2014-10-13 ENCOUNTER — Other Ambulatory Visit: Payer: Medicare Other

## 2014-10-20 ENCOUNTER — Ambulatory Visit
Admission: RE | Admit: 2014-10-20 | Discharge: 2014-10-20 | Disposition: A | Discharge status: Home / Self Care | Payer: Medicare Other | Source: Ambulatory Visit | Attending: Nurse Practitioner | Admitting: Nurse Practitioner

## 2014-10-20 DIAGNOSIS — N6310 Unspecified lump in the right breast, unspecified quadrant: Secondary | ICD-10-CM

## 2014-10-23 ENCOUNTER — Other Ambulatory Visit: Payer: Self-pay | Admitting: Neurosurgery

## 2014-10-23 DIAGNOSIS — G8929 Other chronic pain: Secondary | ICD-10-CM

## 2014-10-23 DIAGNOSIS — M542 Cervicalgia: Principal | ICD-10-CM

## 2014-10-23 DIAGNOSIS — M545 Low back pain: Secondary | ICD-10-CM

## 2014-11-05 ENCOUNTER — Ambulatory Visit
Admission: RE | Admit: 2014-11-05 | Discharge: 2014-11-05 | Disposition: A | Discharge status: Home / Self Care | Payer: Medicare Other | Source: Ambulatory Visit | Attending: Neurosurgery | Admitting: Neurosurgery

## 2014-11-05 DIAGNOSIS — M545 Low back pain, unspecified: Secondary | ICD-10-CM

## 2014-11-05 DIAGNOSIS — M542 Cervicalgia: Principal | ICD-10-CM

## 2014-11-05 DIAGNOSIS — G8929 Other chronic pain: Secondary | ICD-10-CM

## 2014-11-05 MED ORDER — DIAZEPAM 5 MG PO TABS
10.0000 mg | ORAL_TABLET | Freq: Once | ORAL | Status: AC
  Administered 2014-11-05: 10 mg via ORAL

## 2014-11-05 MED ORDER — ONDANSETRON HCL 4 MG/2ML IJ SOLN
4.0000 mg | Freq: Once | INTRAMUSCULAR | Status: AC
  Administered 2014-11-05: 4 mg via INTRAMUSCULAR

## 2014-11-05 MED ORDER — MEPERIDINE HCL 100 MG/ML IJ SOLN
100.0000 mg | Freq: Once | INTRAMUSCULAR | Status: AC
  Administered 2014-11-05: 100 mg via INTRAMUSCULAR

## 2014-11-05 MED ORDER — IOHEXOL 300 MG/ML  SOLN
10.0000 mL | Freq: Once | INTRAMUSCULAR | Status: DC | PRN
  Administered 2014-11-05: 10 mL via INTRATHECAL

## 2014-11-05 NOTE — Discharge Instructions (Signed)
Myelogram Discharge Instructions  1. Go home and rest quietly for the next 24 hours.  It is important to lie flat for the next 24 hours.  Get up only to go to the restroom.  You may lie in the bed or on a couch on your back, your stomach, your left side or your right side.  You may have one pillow under your head.  You may have pillows between your knees while you are on your side or under your knees while you are on your back.  2. DO NOT drive today.  Recline the seat as far back as it will go, while still wearing your seat belt, on the way home.  3. You may get up to go to the bathroom as needed.  You may sit up for 10 minutes to eat.  You may resume your normal diet and medications unless otherwise indicated.  Drink lots of extra fluids today and tomorrow.  4. The incidence of headache, nausea, or vomiting is about 5% (one in 20 patients).  If you develop a headache, lie flat and drink plenty of fluids until the headache goes away.  Caffeinated beverages may be helpful.  If you develop severe nausea and vomiting or a headache that does not go away with flat bed rest, call [phone].  5. You may resume normal activities after your 24 hours of bed rest is over; however, do not exert yourself strongly or do any heavy lifting tomorrow. If when you get up you have a headache when standing, go back to bed and force fluids for another 24 hours.  6. Call your physician for a follow-up appointment.  The results of your myelogram will be sent directly to your physician by the following day.  7. If you have any questions or if complications develop after you arrive home, please call [phone].  Discharge instructions have been explained to the patient.  The patient, or the person responsible for the patient, fully understands these instructions.       MAY RESUME XARELTO TO DAY.  May resume Wellbutrin on Nov. 4, 2016, after 11:00 am.

## 2014-11-05 NOTE — Progress Notes (Signed)
Pt has been off Xarelto and Wellbutrin for the past 2 days.   Discharge instructions explained to pt.

## 2014-11-10 ENCOUNTER — Other Ambulatory Visit: Payer: Self-pay | Admitting: Neurosurgery

## 2014-11-23 ENCOUNTER — Encounter (HOSPITAL_COMMUNITY)
Admission: RE | Admit: 2014-11-23 | Discharge: 2014-11-23 | Disposition: A | Discharge status: Home / Self Care | Payer: Medicare Other | Source: Ambulatory Visit | Attending: Neurosurgery | Admitting: Neurosurgery

## 2014-11-23 ENCOUNTER — Encounter (HOSPITAL_COMMUNITY): Payer: Self-pay

## 2014-11-23 DIAGNOSIS — Z01818 Encounter for other preprocedural examination: Secondary | ICD-10-CM | POA: Insufficient documentation

## 2014-11-23 DIAGNOSIS — Z79899 Other long term (current) drug therapy: Secondary | ICD-10-CM | POA: Insufficient documentation

## 2014-11-23 DIAGNOSIS — Z7902 Long term (current) use of antithrombotics/antiplatelets: Secondary | ICD-10-CM | POA: Diagnosis not present

## 2014-11-23 DIAGNOSIS — I4891 Unspecified atrial fibrillation: Secondary | ICD-10-CM | POA: Insufficient documentation

## 2014-11-23 DIAGNOSIS — E119 Type 2 diabetes mellitus without complications: Secondary | ICD-10-CM | POA: Diagnosis not present

## 2014-11-23 DIAGNOSIS — K219 Gastro-esophageal reflux disease without esophagitis: Secondary | ICD-10-CM | POA: Diagnosis not present

## 2014-11-23 DIAGNOSIS — Z87891 Personal history of nicotine dependence: Secondary | ICD-10-CM | POA: Diagnosis not present

## 2014-11-23 DIAGNOSIS — Z01812 Encounter for preprocedural laboratory examination: Secondary | ICD-10-CM | POA: Diagnosis not present

## 2014-11-23 DIAGNOSIS — J45909 Unspecified asthma, uncomplicated: Secondary | ICD-10-CM | POA: Insufficient documentation

## 2014-11-23 DIAGNOSIS — J449 Chronic obstructive pulmonary disease, unspecified: Secondary | ICD-10-CM | POA: Insufficient documentation

## 2014-11-23 DIAGNOSIS — I10 Essential (primary) hypertension: Secondary | ICD-10-CM | POA: Diagnosis not present

## 2014-11-23 DIAGNOSIS — Z7984 Long term (current) use of oral hypoglycemic drugs: Secondary | ICD-10-CM | POA: Diagnosis not present

## 2014-11-23 DIAGNOSIS — M4722 Other spondylosis with radiculopathy, cervical region: Secondary | ICD-10-CM | POA: Diagnosis not present

## 2014-11-23 DIAGNOSIS — G4733 Obstructive sleep apnea (adult) (pediatric): Secondary | ICD-10-CM | POA: Insufficient documentation

## 2014-11-23 HISTORY — DX: Cardiac arrhythmia, unspecified: I49.9

## 2014-11-23 HISTORY — DX: Depression, unspecified: F32.A

## 2014-11-23 HISTORY — DX: Nausea with vomiting, unspecified: R11.2

## 2014-11-23 HISTORY — DX: Sleep apnea, unspecified: G47.30

## 2014-11-23 HISTORY — DX: Nausea with vomiting, unspecified: Z98.890

## 2014-11-23 HISTORY — DX: Major depressive disorder, single episode, unspecified: F32.9

## 2014-11-23 LAB — BASIC METABOLIC PANEL
ANION GAP: 7 (ref 5–15)
BUN: 9 mg/dL (ref 6–20)
CHLORIDE: 98 mmol/L — AB (ref 101–111)
CO2: 29 mmol/L (ref 22–32)
Calcium: 10 mg/dL (ref 8.9–10.3)
Creatinine, Ser: 0.58 mg/dL (ref 0.44–1.00)
GFR calc Af Amer: >60 mL/min (ref >60)
GFR calc non Af Amer: >60 mL/min (ref >60)
GLUCOSE: 119 mg/dL — AB (ref 65–99)
POTASSIUM: 4 mmol/L (ref 3.5–5.1)
Sodium: 134 mmol/L — ABNORMAL LOW (ref 135–145)

## 2014-11-23 LAB — SURGICAL PCR SCREEN
MRSA, PCR: NEGATIVE
Staphylococcus aureus: POSITIVE — AB

## 2014-11-23 LAB — CBC
HCT: 38.1 % (ref 36.0–46.0)
HEMOGLOBIN: 12.9 g/dL (ref 12.0–15.0)
MCH: 29.2 pg (ref 26.0–34.0)
MCHC: 33.9 g/dL (ref 30.0–36.0)
MCV: 86.2 fL (ref 78.0–100.0)
Platelets: 297 10*3/uL (ref 150–400)
RBC: 4.42 MIL/uL (ref 3.87–5.11)
RDW: 13 % (ref 11.5–15.5)
WBC: 10.3 10*3/uL (ref 4.0–10.5)

## 2014-11-23 LAB — GLUCOSE, CAPILLARY: Glucose-Capillary: 120 mg/dL — ABNORMAL HIGH (ref 65–99)

## 2014-11-23 NOTE — Pre-Procedure Instructions (Signed)
    HELAINE BURFIELD  11/23/2014      [name] - [place], [place] - [address] [address] Phone: [phone] Fax: [phone]    Your procedure is scheduled on 12/02/14.  Report to [facility] Admitting at 12 noon  Call this number if you have problems the morning of surgery:  [phone]   Remember:  Do not eat food or drink liquids after midnight.  Take these medicines the morning of surgery with A SIP OF WATER --all inhalers,bclofen,valium,diltiazem,nexium,neurontin,imdur,oxycodone   Do not wear jewelry, make-up or nail polish.  Do not wear lotions, powders, or perfumes.  You may wear deodorant.  Do not shave 48 hours prior to surgery.  Men may shave face and neck.  Do not bring valuables to the hospital.  [facility] is not responsible for any belongings or valuables.  Contacts, dentures or bridgework may not be worn into surgery.  Leave your suitcase in the car.  After surgery it may be brought to your room.  For patients admitted to the hospital, discharge time will be determined by your treatment team.  Patients discharged the day of surgery will not be allowed to drive home.   Name and phone number of your driver:    Special instructions:   Please read over the following fact sheets that you were given. Pain Booklet, Coughing and Deep Breathing, MRSA Information and Surgical Site Infection Prevention

## 2014-11-24 NOTE — Progress Notes (Signed)
Anesthesia Chart Review:  Pt is 54 year old female scheduled for C5-6 ACDF on 12/02/2014 with Dr. Arnoldo Morale.   PCP is Dr. Jerene Bears. Cardiologist is Dr. Rozann Lesches, last office visit 08/18/14.   PMH includes:  Atrial fibrillation (maintaining SR on flecainide s/p cardioversion 05/04/14), HTN, DM, OSA (BiPAP), COPD, asthma, GERD, post-op N/V, pituitary tumor. Bladder does not empty, pt self-caths. Former smoker. BMI 45.   Medications include: albuterol, diltiazem, nexium, flecainide, lasix, imdur, lisinopril, metformin, potassium, xarelto, spironolactone. Pt to stop xarelto 11/26/14.   Preoperative labs reviewed.  Glucose 119, hgbA1c not obtained.   EKG 08/18/14: Sinus  Rhythm. LAFB. Voltage criteria for LVH. Voltage criteria w/o ST/T abnormality may be normal.  Cardiac cath 11/15/10:  1. Normal left ventricular systolic function. Ejection fraction 65%. No significant mitral regurgitation. 2. Mildly elevated left ventricular end-diastolic pressure suggestive of diastolic dysfunction. 3. Normal coronary arteries, right dominant circulation.  By Dr. Myles Gip note dated 05/02/12, pt had Lexiscan Cardiolite in 02/2012 that demonstrated no diagnostic ST segment abnormalities with evidence of attenuation artifact, no ischemia, LVEF 73%. Will attempt to obtain copy of report for our records.   If no changes, I anticipate pt can proceed with surgery as scheduled.   Willeen Cass, FNP-BC [facility]/Anesthesiology Phone: [phone] 11/24/2014 2:09 PM

## 2014-11-30 ENCOUNTER — Telehealth: Payer: Self-pay | Admitting: Cardiology

## 2014-11-30 NOTE — Telephone Encounter (Signed)
Having cervical fusion done on Wednesday, 12/02/2014 at [facility] - Dr. Arnoldo Morale .  Was told by her pain management doctor to contact us regarding low blood pressure.  Stated that this BP was concerning for her since she was stressed & frustrated by the time she got to office as she had a flat tire on the way.  She thought her BP would have been elevated.  Stated that she did not have any other readings written down.  Did state that her BP at her pre-op visit was relatively in the same range.  Informed patient that message will be sent to provider for further advice.

## 2014-11-30 NOTE — Telephone Encounter (Signed)
Noted. Actually, a blood pressure of 103/55 is not overly concerning, particularly if she is asymptomatic. This is fairly close to the last blood pressure we had when I saw her in the office back in August - 102/58. Would not change her medications now.

## 2014-11-30 NOTE — Telephone Encounter (Signed)
[name] [name] called today in regards to her low blood pressure. States that she saw her pain doctor today and was told to call Our office in regards to her BP being low.  103/55 per pain doctor. Patient is scheduled for surgery on 12/02/14 by Dr. Arnoldo Morale.

## 2014-12-01 ENCOUNTER — Ambulatory Visit (INDEPENDENT_AMBULATORY_CARE_PROVIDER_SITE_OTHER): Payer: Medicare Other | Admitting: *Deleted

## 2014-12-01 ENCOUNTER — Telehealth: Payer: Self-pay | Admitting: *Deleted

## 2014-12-01 VITALS — BP 101/71 | HR 80

## 2014-12-01 DIAGNOSIS — I480 Paroxysmal atrial fibrillation: Secondary | ICD-10-CM

## 2014-12-01 MED ORDER — DEXTROSE 5 % IV SOLN
3.0000 g | INTRAVENOUS | Status: AC
  Administered 2014-12-02: 3 g via INTRAVENOUS
  Filled 2014-12-01: qty 3000

## 2014-12-01 NOTE — Telephone Encounter (Signed)
Patient thinks her heart is out of rhythm today. Patient is scheduled for c-spine surgery tomorrow. Patient instructed to come to the office now for an EKG. Patient said she didn't have transportation for right now but could be here by 4:00 pm today. Patient is going to check with her transportation and call office back to confirm if she can come today.

## 2014-12-01 NOTE — Telephone Encounter (Signed)
Noted. Get ECG. It is not necessarily clear however that having recurrent atrial fibrillation would lead to a cancellation of surgery, as long as she is hemodynamically stable and heart rate is adequately controlled. She has known PAF - she could be followed by our cardiology service while hospitalized at [facility].

## 2014-12-01 NOTE — Telephone Encounter (Signed)
Patient informed and is coming to have EKG done now.

## 2014-12-01 NOTE — Telephone Encounter (Signed)
Patient notified

## 2014-12-02 ENCOUNTER — Inpatient Hospital Stay (HOSPITAL_COMMUNITY): Payer: Medicare Other | Admitting: Emergency Medicine

## 2014-12-02 ENCOUNTER — Inpatient Hospital Stay (HOSPITAL_COMMUNITY): Payer: Medicare Other | Admitting: Anesthesiology

## 2014-12-02 ENCOUNTER — Encounter (HOSPITAL_COMMUNITY): Payer: Self-pay | Admitting: *Deleted

## 2014-12-02 ENCOUNTER — Encounter (HOSPITAL_COMMUNITY)
Admission: RE | Disposition: A | Discharge status: Home / Self Care | Payer: Self-pay | Source: Ambulatory Visit | Attending: Neurosurgery

## 2014-12-02 ENCOUNTER — Telehealth: Payer: Self-pay | Admitting: *Deleted

## 2014-12-02 ENCOUNTER — Inpatient Hospital Stay (HOSPITAL_COMMUNITY): Payer: Medicare Other

## 2014-12-02 ENCOUNTER — Inpatient Hospital Stay (HOSPITAL_COMMUNITY)
Admission: RE | Admit: 2014-12-02 | Discharge: 2014-12-03 | DRG: 473 | Disposition: A | Discharge status: Home / Self Care | Payer: Medicare Other | Source: Ambulatory Visit | Attending: Neurosurgery | Admitting: Neurosurgery

## 2014-12-02 DIAGNOSIS — Z79891 Long term (current) use of opiate analgesic: Secondary | ICD-10-CM | POA: Diagnosis not present

## 2014-12-02 DIAGNOSIS — M797 Fibromyalgia: Secondary | ICD-10-CM | POA: Diagnosis present

## 2014-12-02 DIAGNOSIS — N189 Chronic kidney disease, unspecified: Secondary | ICD-10-CM | POA: Diagnosis present

## 2014-12-02 DIAGNOSIS — I129 Hypertensive chronic kidney disease with stage 1 through stage 4 chronic kidney disease, or unspecified chronic kidney disease: Secondary | ICD-10-CM | POA: Diagnosis present

## 2014-12-02 DIAGNOSIS — J449 Chronic obstructive pulmonary disease, unspecified: Secondary | ICD-10-CM | POA: Diagnosis present

## 2014-12-02 DIAGNOSIS — Z87891 Personal history of nicotine dependence: Secondary | ICD-10-CM | POA: Diagnosis not present

## 2014-12-02 DIAGNOSIS — F329 Major depressive disorder, single episode, unspecified: Secondary | ICD-10-CM | POA: Diagnosis present

## 2014-12-02 DIAGNOSIS — M4722 Other spondylosis with radiculopathy, cervical region: Secondary | ICD-10-CM | POA: Diagnosis present

## 2014-12-02 DIAGNOSIS — K589 Irritable bowel syndrome without diarrhea: Secondary | ICD-10-CM | POA: Diagnosis present

## 2014-12-02 DIAGNOSIS — E1122 Type 2 diabetes mellitus with diabetic chronic kidney disease: Secondary | ICD-10-CM | POA: Diagnosis present

## 2014-12-02 DIAGNOSIS — J45909 Unspecified asthma, uncomplicated: Secondary | ICD-10-CM | POA: Diagnosis present

## 2014-12-02 DIAGNOSIS — M5010 Cervical disc disorder with radiculopathy, unspecified cervical region: Secondary | ICD-10-CM | POA: Diagnosis present

## 2014-12-02 DIAGNOSIS — K219 Gastro-esophageal reflux disease without esophagitis: Secondary | ICD-10-CM | POA: Diagnosis present

## 2014-12-02 DIAGNOSIS — Z419 Encounter for procedure for purposes other than remedying health state, unspecified: Secondary | ICD-10-CM

## 2014-12-02 DIAGNOSIS — G4730 Sleep apnea, unspecified: Secondary | ICD-10-CM | POA: Diagnosis present

## 2014-12-02 DIAGNOSIS — M4802 Spinal stenosis, cervical region: Secondary | ICD-10-CM | POA: Diagnosis present

## 2014-12-02 DIAGNOSIS — I4891 Unspecified atrial fibrillation: Secondary | ICD-10-CM | POA: Diagnosis present

## 2014-12-02 DIAGNOSIS — M542 Cervicalgia: Secondary | ICD-10-CM | POA: Diagnosis present

## 2014-12-02 HISTORY — PX: ANTERIOR CERVICAL DECOMP/DISCECTOMY FUSION: SHX1161

## 2014-12-02 LAB — GLUCOSE, CAPILLARY
GLUCOSE-CAPILLARY: 136 mg/dL — AB (ref 65–99)
GLUCOSE-CAPILLARY: 137 mg/dL — AB (ref 65–99)
Glucose-Capillary: 229 mg/dL — ABNORMAL HIGH (ref 65–99)

## 2014-12-02 SURGERY — ANTERIOR CERVICAL DECOMPRESSION/DISCECTOMY FUSION 1 LEVEL
Anesthesia: General

## 2014-12-02 MED ORDER — THROMBIN 5000 UNITS EX SOLR
CUTANEOUS | Status: DC | PRN
  Administered 2014-12-02 (×2): 5000 [IU] via TOPICAL

## 2014-12-02 MED ORDER — PANTOPRAZOLE SODIUM 40 MG PO TBEC
80.0000 mg | DELAYED_RELEASE_TABLET | Freq: Every day | ORAL | Status: DC
  Administered 2014-12-03: 80 mg via ORAL
  Filled 2014-12-02: qty 2

## 2014-12-02 MED ORDER — DEXAMETHASONE SODIUM PHOSPHATE 4 MG/ML IJ SOLN
INTRAMUSCULAR | Status: DC | PRN
  Administered 2014-12-02: 8 mg via INTRAVENOUS

## 2014-12-02 MED ORDER — FENTANYL CITRATE (PF) 250 MCG/5ML IJ SOLN
INTRAMUSCULAR | Status: AC
  Filled 2014-12-02: qty 5

## 2014-12-02 MED ORDER — FLECAINIDE ACETATE 100 MG PO TABS
100.0000 mg | ORAL_TABLET | Freq: Two times a day (BID) | ORAL | Status: DC
Start: 2014-12-02 — End: 2014-12-03
  Administered 2014-12-02 – 2014-12-03 (×2): 100 mg via ORAL
  Filled 2014-12-02 (×3): qty 1

## 2014-12-02 MED ORDER — FUROSEMIDE 80 MG PO TABS
80.0000 mg | ORAL_TABLET | Freq: Two times a day (BID) | ORAL | Status: DC
  Administered 2014-12-03 (×2): 80 mg via ORAL
  Filled 2014-12-02 (×2): qty 1

## 2014-12-02 MED ORDER — DIAZEPAM 5 MG PO TABS
ORAL_TABLET | ORAL | Status: AC
  Filled 2014-12-02: qty 1

## 2014-12-02 MED ORDER — BUPROPION HCL 75 MG PO TABS
75.0000 mg | ORAL_TABLET | Freq: Every day | ORAL | Status: DC
  Administered 2014-12-03: 75 mg via ORAL
  Filled 2014-12-02 (×2): qty 1

## 2014-12-02 MED ORDER — ACETAMINOPHEN 325 MG PO TABS: 650.0000 mg | ORAL_TABLET | ORAL | Status: DC | PRN

## 2014-12-02 MED ORDER — POTASSIUM CHLORIDE CRYS ER 20 MEQ PO TBCR: 40.0000 meq | EXTENDED_RELEASE_TABLET | Freq: Four times a day (QID) | ORAL | Status: DC

## 2014-12-02 MED ORDER — ISOSORBIDE MONONITRATE ER 30 MG PO TB24
30.0000 mg | ORAL_TABLET | Freq: Every evening | ORAL | Status: DC
  Administered 2014-12-03: 30 mg via ORAL
  Filled 2014-12-02: qty 1

## 2014-12-02 MED ORDER — DICYCLOMINE HCL 10 MG PO CAPS
10.0000 mg | ORAL_CAPSULE | Freq: Four times a day (QID) | ORAL | Status: DC | PRN
  Filled 2014-12-02: qty 1

## 2014-12-02 MED ORDER — LIDOCAINE HCL (CARDIAC) 20 MG/ML IV SOLN
INTRAVENOUS | Status: DC | PRN
  Administered 2014-12-02: 60 mg via INTRAVENOUS
  Administered 2014-12-02: 60 mg via INTRATRACHEAL

## 2014-12-02 MED ORDER — ACETAMINOPHEN 650 MG RE SUPP: 650.0000 mg | RECTAL | Status: DC | PRN

## 2014-12-02 MED ORDER — HYDROXYZINE HCL 25 MG PO TABS: 25.0000 mg | ORAL_TABLET | Freq: Two times a day (BID) | ORAL | Status: DC | PRN

## 2014-12-02 MED ORDER — HYDROMORPHONE HCL 1 MG/ML IJ SOLN
INTRAMUSCULAR | Status: AC
  Administered 2014-12-02: 0.5 mg via INTRAVENOUS
  Filled 2014-12-02: qty 1

## 2014-12-02 MED ORDER — ARTIFICIAL TEARS OP OINT
TOPICAL_OINTMENT | OPHTHALMIC | Status: AC
  Filled 2014-12-02: qty 3.5

## 2014-12-02 MED ORDER — ONDANSETRON HCL 4 MG/2ML IJ SOLN
INTRAMUSCULAR | Status: AC
  Filled 2014-12-02: qty 2

## 2014-12-02 MED ORDER — SPIRONOLACTONE 25 MG PO TABS
50.0000 mg | ORAL_TABLET | Freq: Three times a day (TID) | ORAL | Status: DC
  Administered 2014-12-03 (×2): 50 mg via ORAL
  Filled 2014-12-02 (×2): qty 2

## 2014-12-02 MED ORDER — MORPHINE SULFATE ER 30 MG PO TBCR
60.0000 mg | EXTENDED_RELEASE_TABLET | Freq: Two times a day (BID) | ORAL | Status: DC
  Administered 2014-12-02 – 2014-12-03 (×2): 60 mg via ORAL
  Filled 2014-12-02 (×2): qty 2

## 2014-12-02 MED ORDER — DIAZEPAM 5 MG PO TABS
5.0000 mg | ORAL_TABLET | Freq: Four times a day (QID) | ORAL | Status: DC | PRN
  Administered 2014-12-02: 5 mg via ORAL
  Filled 2014-12-02: qty 1

## 2014-12-02 MED ORDER — CEFAZOLIN SODIUM-DEXTROSE 2-3 GM-% IV SOLR
2.0000 g | Freq: Three times a day (TID) | INTRAVENOUS | Status: AC
  Administered 2014-12-02 – 2014-12-03 (×2): 2 g via INTRAVENOUS
  Filled 2014-12-02 (×2): qty 50

## 2014-12-02 MED ORDER — METFORMIN HCL 500 MG PO TABS
500.0000 mg | ORAL_TABLET | Freq: Every day | ORAL | Status: DC
  Administered 2014-12-03: 500 mg via ORAL
  Filled 2014-12-02: qty 1

## 2014-12-02 MED ORDER — BACITRACIN ZINC 500 UNIT/GM EX OINT
TOPICAL_OINTMENT | CUTANEOUS | Status: DC | PRN
  Administered 2014-12-02: 1 via TOPICAL

## 2014-12-02 MED ORDER — LORATADINE 10 MG PO TABS
10.0000 mg | ORAL_TABLET | Freq: Every day | ORAL | Status: DC
  Administered 2014-12-03: 10 mg via ORAL
  Filled 2014-12-02: qty 1

## 2014-12-02 MED ORDER — ALBUTEROL SULFATE (2.5 MG/3ML) 0.083% IN NEBU
2.5000 mg | INHALATION_SOLUTION | Freq: Four times a day (QID) | RESPIRATORY_TRACT | Status: DC | PRN
Start: 2014-12-02 — End: 2014-12-02

## 2014-12-02 MED ORDER — GABAPENTIN 600 MG PO TABS
600.0000 mg | ORAL_TABLET | Freq: Four times a day (QID) | ORAL | Status: DC
  Administered 2014-12-02 – 2014-12-03 (×4): 600 mg via ORAL
  Filled 2014-12-02 (×3): qty 1

## 2014-12-02 MED ORDER — PHENOL 1.4 % MT LIQD: 1.0000 | OROMUCOSAL | Status: DC | PRN

## 2014-12-02 MED ORDER — PROPOFOL 10 MG/ML IV BOLUS
INTRAVENOUS | Status: AC
  Filled 2014-12-02: qty 20

## 2014-12-02 MED ORDER — BACLOFEN 10 MG PO TABS
20.0000 mg | ORAL_TABLET | Freq: Three times a day (TID) | ORAL | Status: DC
  Administered 2014-12-02 – 2014-12-03 (×3): 20 mg via ORAL
  Filled 2014-12-02 (×3): qty 2

## 2014-12-02 MED ORDER — DOCUSATE SODIUM 100 MG PO CAPS
100.0000 mg | ORAL_CAPSULE | Freq: Two times a day (BID) | ORAL | Status: DC
  Administered 2014-12-02 – 2014-12-03 (×2): 100 mg via ORAL
  Filled 2014-12-02: qty 1

## 2014-12-02 MED ORDER — MENTHOL 3 MG MT LOZG: 1.0000 | LOZENGE | OROMUCOSAL | Status: DC | PRN

## 2014-12-02 MED ORDER — LACTATED RINGERS IV SOLN
INTRAVENOUS | Status: DC
  Administered 2014-12-02: 21:00:00 via INTRAVENOUS

## 2014-12-02 MED ORDER — ONDANSETRON HCL 4 MG/2ML IJ SOLN: 4.0000 mg | Freq: Once | INTRAMUSCULAR | Status: DC | PRN

## 2014-12-02 MED ORDER — 0.9 % SODIUM CHLORIDE (POUR BTL) OPTIME
TOPICAL | Status: DC | PRN
  Administered 2014-12-02: 1000 mL

## 2014-12-02 MED ORDER — DILTIAZEM HCL ER COATED BEADS 120 MG PO CP24
120.0000 mg | ORAL_CAPSULE | Freq: Every day | ORAL | Status: DC
  Administered 2014-12-03: 120 mg via ORAL
  Filled 2014-12-02: qty 1

## 2014-12-02 MED ORDER — ROCURONIUM BROMIDE 100 MG/10ML IV SOLN
INTRAVENOUS | Status: DC | PRN
  Administered 2014-12-02: 20 mg via INTRAVENOUS
  Administered 2014-12-02: 10 mg via INTRAVENOUS
  Administered 2014-12-02: 50 mg via INTRAVENOUS
  Administered 2014-12-02: 10 mg via INTRAVENOUS

## 2014-12-02 MED ORDER — ALBUTEROL SULFATE (2.5 MG/3ML) 0.083% IN NEBU: 3.0000 mL | INHALATION_SOLUTION | Freq: Four times a day (QID) | RESPIRATORY_TRACT | Status: DC | PRN

## 2014-12-02 MED ORDER — MIDAZOLAM HCL 2 MG/2ML IJ SOLN
INTRAMUSCULAR | Status: AC
  Filled 2014-12-02: qty 2

## 2014-12-02 MED ORDER — SUGAMMADEX SODIUM 500 MG/5ML IV SOLN
INTRAVENOUS | Status: DC | PRN
  Administered 2014-12-02: 276.6 mg via INTRAVENOUS

## 2014-12-02 MED ORDER — SODIUM CHLORIDE 0.9 % IR SOLN
Status: DC | PRN
  Administered 2014-12-02: 16:00:00

## 2014-12-02 MED ORDER — ONDANSETRON HCL 4 MG/2ML IJ SOLN: 4.0000 mg | INTRAMUSCULAR | Status: DC | PRN

## 2014-12-02 MED ORDER — DEXAMETHASONE 4 MG PO TABS
4.0000 mg | ORAL_TABLET | Freq: Four times a day (QID) | ORAL | Status: AC
  Administered 2014-12-03 (×2): 4 mg via ORAL
  Filled 2014-12-02 (×2): qty 1

## 2014-12-02 MED ORDER — LACTATED RINGERS IV SOLN
INTRAVENOUS | Status: DC
  Administered 2014-12-02 (×2): via INTRAVENOUS

## 2014-12-02 MED ORDER — OXYCODONE-ACETAMINOPHEN 5-325 MG PO TABS
1.0000 | ORAL_TABLET | ORAL | Status: DC | PRN
  Administered 2014-12-03 (×3): 2 via ORAL
  Filled 2014-12-02 (×3): qty 2

## 2014-12-02 MED ORDER — HYDROCODONE-ACETAMINOPHEN 5-325 MG PO TABS: 1.0000 | ORAL_TABLET | ORAL | Status: DC | PRN

## 2014-12-02 MED ORDER — ROCURONIUM BROMIDE 50 MG/5ML IV SOLN
INTRAVENOUS | Status: AC
  Filled 2014-12-02: qty 1

## 2014-12-02 MED ORDER — HYDROMORPHONE HCL 1 MG/ML IJ SOLN
0.2500 mg | INTRAMUSCULAR | Status: DC | PRN
  Administered 2014-12-02 (×3): 0.5 mg via INTRAVENOUS

## 2014-12-02 MED ORDER — MIDAZOLAM HCL 5 MG/5ML IJ SOLN
INTRAMUSCULAR | Status: DC | PRN
  Administered 2014-12-02: 2 mg via INTRAVENOUS

## 2014-12-02 MED ORDER — NITROGLYCERIN 0.4 MG/SPRAY TL SOLN
1.0000 | Status: DC | PRN
  Filled 2014-12-02: qty 4.9

## 2014-12-02 MED ORDER — BUPIVACAINE-EPINEPHRINE (PF) 0.5% -1:200000 IJ SOLN
INTRAMUSCULAR | Status: DC | PRN
  Administered 2014-12-02: 10 mL via PERINEURAL

## 2014-12-02 MED ORDER — POLYETHYLENE GLYCOL 3350 17 GM/SCOOP PO POWD
17.0000 g | Freq: Every day | ORAL | Status: DC | PRN
  Filled 2014-12-02: qty 255

## 2014-12-02 MED ORDER — MORPHINE SULFATE (PF) 2 MG/ML IV SOLN
1.0000 mg | INTRAVENOUS | Status: DC | PRN
  Administered 2014-12-03: 2 mg via INTRAVENOUS
  Filled 2014-12-02: qty 1

## 2014-12-02 MED ORDER — DEXAMETHASONE SODIUM PHOSPHATE 4 MG/ML IJ SOLN
4.0000 mg | Freq: Four times a day (QID) | INTRAMUSCULAR | Status: AC
  Administered 2014-12-03: 4 mg via INTRAVENOUS
  Filled 2014-12-02: qty 1

## 2014-12-02 MED ORDER — OXYCODONE-ACETAMINOPHEN 10-325 MG PO TABS: 1.0000 | ORAL_TABLET | ORAL | Status: DC | PRN

## 2014-12-02 MED ORDER — LISINOPRIL 2.5 MG PO TABS
2.5000 mg | ORAL_TABLET | Freq: Every day | ORAL | Status: DC
  Administered 2014-12-02: 2.5 mg via ORAL
  Filled 2014-12-02: qty 1

## 2014-12-02 MED ORDER — ONDANSETRON HCL 4 MG/2ML IJ SOLN
4.0000 mg | Freq: Once | INTRAMUSCULAR | Status: AC
  Administered 2014-12-02: 4 mg via INTRAVENOUS
  Filled 2014-12-02: qty 2

## 2014-12-02 MED ORDER — MEPERIDINE HCL 25 MG/ML IJ SOLN: 6.2500 mg | INTRAMUSCULAR | Status: DC | PRN

## 2014-12-02 MED ORDER — ALUM & MAG HYDROXIDE-SIMETH 200-200-20 MG/5ML PO SUSP: 30.0000 mL | Freq: Four times a day (QID) | ORAL | Status: DC | PRN

## 2014-12-02 MED ORDER — FENTANYL CITRATE (PF) 100 MCG/2ML IJ SOLN
INTRAMUSCULAR | Status: DC | PRN
  Administered 2014-12-02: 50 ug via INTRAVENOUS
  Administered 2014-12-02 (×2): 100 ug via INTRAVENOUS

## 2014-12-02 MED ORDER — BISACODYL 10 MG RE SUPP
10.0000 mg | Freq: Every day | RECTAL | Status: DC | PRN
Start: 2014-12-02 — End: 2014-12-03

## 2014-12-02 MED ORDER — HEMOSTATIC AGENTS (NO CHARGE) OPTIME
TOPICAL | Status: DC | PRN
  Administered 2014-12-02: 1 via TOPICAL

## 2014-12-02 MED ORDER — MECLIZINE HCL 12.5 MG PO TABS: 25.0000 mg | ORAL_TABLET | Freq: Two times a day (BID) | ORAL | Status: DC | PRN

## 2014-12-02 MED ORDER — PROPOFOL 10 MG/ML IV BOLUS
INTRAVENOUS | Status: DC | PRN
  Administered 2014-12-02: 180 mg via INTRAVENOUS

## 2014-12-02 MED ORDER — ISOSORBIDE MONONITRATE ER 30 MG PO TB24
120.0000 mg | ORAL_TABLET | Freq: Every morning | ORAL | Status: DC
  Administered 2014-12-03: 120 mg via ORAL
  Filled 2014-12-02: qty 4

## 2014-12-02 SURGICAL SUPPLY — 57 items
BAG DECANTER FOR FLEXI CONT (MISCELLANEOUS) ×2 IMPLANT
BENZOIN TINCTURE PRP APPL 2/3 (GAUZE/BANDAGES/DRESSINGS) ×2 IMPLANT
BIT DRILL NEURO 2X3.1 SFT TUCH (MISCELLANEOUS) ×1 IMPLANT
BLADE SURG 15 STRL LF DISP TIS (BLADE) ×1 IMPLANT
BLADE SURG 15 STRL SS (BLADE) ×1
BLADE ULTRA TIP 2M (BLADE) ×2 IMPLANT
BRUSH SCRUB EZ PLAIN DRY (MISCELLANEOUS) ×2 IMPLANT
BUR BARREL STRAIGHT FLUTE 4.0 (BURR) ×2 IMPLANT
BUR MATCHSTICK NEURO 3.0 LAGG (BURR) ×2 IMPLANT
CAGE VISTA-S 11X14X9-0 (Cage) ×2 IMPLANT
CANISTER SUCT 3000ML PPV (MISCELLANEOUS) ×2 IMPLANT
COVER MAYO STAND STRL (DRAPES) ×2 IMPLANT
DEVICE FUSION VISTA 11X14X8MM (Spacer) IMPLANT
DRAPE LAPAROTOMY 100X72 PEDS (DRAPES) ×2 IMPLANT
DRAPE MICROSCOPE LEICA (MISCELLANEOUS) IMPLANT
DRAPE POUCH INSTRU U-SHP 10X18 (DRAPES) ×2 IMPLANT
DRAPE SURG 17X23 STRL (DRAPES) ×4 IMPLANT
DRILL NEURO 2X3.1 SOFT TOUCH (MISCELLANEOUS) ×2
ELECT BLADE 4.0 EZ CLEAN MEGAD (MISCELLANEOUS) ×2
ELECT REM PT RETURN 9FT ADLT (ELECTROSURGICAL) ×2
ELECTRODE BLDE 4.0 EZ CLN MEGD (MISCELLANEOUS) ×1 IMPLANT
ELECTRODE REM PT RTRN 9FT ADLT (ELECTROSURGICAL) ×1 IMPLANT
GAUZE SPONGE 4X4 12PLY STRL (GAUZE/BANDAGES/DRESSINGS) ×2 IMPLANT
GAUZE SPONGE 4X4 16PLY XRAY LF (GAUZE/BANDAGES/DRESSINGS) ×2 IMPLANT
GLOVE BIO SURGEON STRL SZ8 (GLOVE) ×2 IMPLANT
GLOVE BIO SURGEON STRL SZ8.5 (GLOVE) ×2 IMPLANT
GLOVE EXAM NITRILE LRG STRL (GLOVE) IMPLANT
GLOVE EXAM NITRILE MD LF STRL (GLOVE) IMPLANT
GLOVE EXAM NITRILE XL STR (GLOVE) IMPLANT
GLOVE EXAM NITRILE XS STR PU (GLOVE) IMPLANT
GOWN STRL REUS W/ TWL LRG LVL3 (GOWN DISPOSABLE) IMPLANT
GOWN STRL REUS W/ TWL XL LVL3 (GOWN DISPOSABLE) ×1 IMPLANT
GOWN STRL REUS W/TWL LRG LVL3 (GOWN DISPOSABLE)
GOWN STRL REUS W/TWL XL LVL3 (GOWN DISPOSABLE) ×1
KIT BASIN OR (CUSTOM PROCEDURE TRAY) ×2 IMPLANT
KIT ROOM TURNOVER OR (KITS) ×2 IMPLANT
MARKER SKIN DUAL TIP RULER LAB (MISCELLANEOUS) ×2 IMPLANT
NEEDLE HYPO 22GX1.5 SAFETY (NEEDLE) ×2 IMPLANT
NEEDLE SPNL 18GX3.5 QUINCKE PK (NEEDLE) ×2 IMPLANT
NS IRRIG 1000ML POUR BTL (IV SOLUTION) ×2 IMPLANT
PACK LAMINECTOMY NEURO (CUSTOM PROCEDURE TRAY) ×2 IMPLANT
PIN DISTRACTION 14MM (PIN) ×4 IMPLANT
PLATE ANT CERV XTEND ELD 1 L14 (Plate) ×2 IMPLANT
PUTTY BIOACTIVE 2CC KINEX (Miscellaneous) ×2 IMPLANT
RUBBERBAND STERILE (MISCELLANEOUS) IMPLANT
SCREW XTD VAR 4.2 SELF TAP 12 (Screw) ×8 IMPLANT
SPONGE INTESTINAL PEANUT (DISPOSABLE) ×4 IMPLANT
SPONGE SURGIFOAM ABS GEL SZ50 (HEMOSTASIS) ×2 IMPLANT
STRIP CLOSURE SKIN 1/2X4 (GAUZE/BANDAGES/DRESSINGS) ×2 IMPLANT
SUT VIC AB 0 CT1 27 (SUTURE) ×1
SUT VIC AB 0 CT1 27XBRD ANTBC (SUTURE) ×1 IMPLANT
SUT VIC AB 3-0 SH 8-18 (SUTURE) ×2 IMPLANT
TAPE CLOTH SURG 4X10 WHT LF (GAUZE/BANDAGES/DRESSINGS) ×2 IMPLANT
TOWEL OR 17X24 6PK STRL BLUE (TOWEL DISPOSABLE) ×2 IMPLANT
TOWEL OR 17X26 10 PK STRL BLUE (TOWEL DISPOSABLE) ×2 IMPLANT
VISTA 11X14X8MM (Spacer) IMPLANT
WATER STERILE IRR 1000ML POUR (IV SOLUTION) ×2 IMPLANT

## 2014-12-02 NOTE — Anesthesia Procedure Notes (Signed)
Procedure Name: Intubation Date/Time: 12/02/2014 3:13 PM Performed by: Maryland Pink Pre-anesthesia Checklist: Patient identified, Emergency Drugs available, Suction available, Patient being monitored and Timeout performed Patient Re-evaluated:Patient Re-evaluated prior to inductionOxygen Delivery Method: Circle system utilized Preoxygenation: Pre-oxygenation with 100% oxygen Intubation Type: IV induction Ventilation: Mask ventilation without difficulty Laryngoscope Size: Mac and 4 Grade View: Grade I Tube type: Oral Tube size: 7.0 mm Number of attempts: 1 Airway Equipment and Method: Stylet and LTA kit utilized Placement Confirmation: ETT inserted through vocal cords under direct vision,  positive ETCO2 and breath sounds checked- equal and bilateral Secured at: 23 cm Tube secured with: Tape Dental Injury: Teeth and Oropharynx as per pre-operative assessment

## 2014-12-02 NOTE — Anesthesia Postprocedure Evaluation (Signed)
Anesthesia Post Note  Patient: [name] [name]  Procedure(s) Performed: Procedure(s) (LRB): Cervical five cervical six anterior cervical decompression with fusion interbody prosthesis plating and bone graft (N/A)  Patient location during evaluation: PACU Anesthesia Type: General Level of consciousness: awake and alert Pain management: pain level controlled Vital Signs Assessment: post-procedure vital signs reviewed and stable Respiratory status: spontaneous breathing, nonlabored ventilation, respiratory function stable and patient connected to nasal cannula oxygen Cardiovascular status: blood pressure returned to baseline and stable Postop Assessment: no signs of nausea or vomiting Anesthetic complications: no    Last Vitals:  Filed Vitals:   12/02/14 1730 12/02/14 1745  BP: 112/55 114/56  Pulse: 74 72  Temp: 37 C   Resp: 12 14    Last Pain:  Filed Vitals:   12/02/14 1750  PainSc: 5                  [name] DAVID

## 2014-12-02 NOTE — Progress Notes (Signed)
Dr. Conrad Harris called and informed of nausea, Zofran ordered and that pt went to cardiologist's office yesterday due to pt reports of "going out of rhythm", hx of a-fib. EKG was done and pt was in SR. Pt's cardiologist aware of surgery today, EKG and notes from office visit in [place].

## 2014-12-02 NOTE — Telephone Encounter (Signed)
-----   Message from Satira Sark, MD sent at 12/01/2014  4:08 PM EST ----- I reviewed the ECG, she remains in sinus rhythm.  ----- Message -----    From: Merlene Laughter, LPN    Sent: [id number]   4:06 PM      To: Satira Sark, MD

## 2014-12-02 NOTE — Telephone Encounter (Signed)
Patient informed. 

## 2014-12-02 NOTE — Transfer of Care (Signed)
Immediate Anesthesia Transfer of Care Note  Patient: [name] [name]  Procedure(s) Performed: Procedure(s) with comments: Cervical five cervical six anterior cervical decompression with fusion interbody prosthesis plating and bone graft (N/A) - C56 anterior cervical decompression with fusion interbody prosthesis plating and bonegraft  Patient Location: PACU  Anesthesia Type:General  Level of Consciousness: awake, alert  and oriented  Airway & Oxygen Therapy: Patient Spontanous Breathing and Patient connected to nasal cannula oxygen  Post-op Assessment: Report given to RN, Post -op Vital signs reviewed and stable and Patient moving all extremities  Post vital signs: Reviewed and stable  Last Vitals:  Filed Vitals:   12/02/14 1158  BP: 120/56  Pulse: 64  Temp: 37.2 C  Resp: 20    Complications: No apparent anesthesia complications

## 2014-12-02 NOTE — Op Note (Signed)
Brief history: White female who has complained of neck and arm pain consistent with a cervical radiculopathy. She has failed medical management and was worked up with a cervical MRI and myelogram CT. This demonstrated the patient had disc degeneration and spondylosis most prominent at C5-6. I discussed the various treatment options with the patient including surgery. She has weighed the risks, benefits, and alternatives surgery and decided to proceed with a C5-6 anterior cervical discectomy, fusion, and plating.  Preoperative diagnosis: C5-6 disc degeneration, spondylosis, foraminal stenosis, cervicalgia, cervical radiculopathy  Postoperative diagnosis: The same  Procedure: C5-6 Anterior cervical discectomy/decompression; C5-6 interbody arthrodesis with local morcellized autograft bone and Kinnex bone graft extender; insertion of interbody prosthesis at C5-6 (Zimmer peek interbody prosthesis); anterior cervical plating from C5-6 with globus titanium plate  Surgeon: Dr. Earle Gell  Asst.: Dr. Dayton Bailiff  Anesthesia: Gen. endotracheal  Estimated blood loss: Minimal  Drains: None  Complications: None  Description of procedure: The patient was brought to the operating room by the anesthesia team. General endotracheal anesthesia was induced. A roll was placed under the patient's shoulders to keep the neck in the neutral position. The patient's anterior cervical region was then prepared with Betadine scrub and Betadine solution. Sterile drapes were applied.  The area to be incised was then injected with Marcaine with epinephrine solution. I then used a scalpel to make a transverse incision in the patient's left anterior neck. I used the Metzenbaum scissors to divide the platysmal muscle and then to dissect medial to the sternocleidomastoid muscle, jugular vein, and carotid artery. I carefully dissected down towards the anterior cervical spine identifying the esophagus and retracting it medially.  Then using Kitner swabs to clear soft tissue from the anterior cervical spine. We then inserted a bent spinal needle into the upper exposed intervertebral disc space. We then obtained intraoperative radiographs confirm our location.  I then used electrocautery to detach the medial border of the longus colli muscle bilaterally from the C5-6 intervertebral disc spaces. I then inserted the Caspar self-retaining retractor underneath the longus colli muscle bilaterally to provide exposure.  We then incised the intervertebral disc at C5-6. We then performed a partial intervertebral discectomy with a pituitary forceps and the Karlin curettes. I then inserted distraction screws into the vertebral bodies at C5-6. We then distracted the interspace. We then used the high-speed drill to decorticate the vertebral endplates at 075-GRM, to drill away the remainder of the intervertebral disc, to drill away some posterior spondylosis, and to thin out the posterior longitudinal ligament. I then incised ligament with the arachnoid knife. We then removed the ligament with a Kerrison punches undercutting the vertebral endplates and decompressing the thecal sac. We then performed foraminotomies about the bilateral C6 nerve roots. This completed the decompression at this level.  We now turned our to attention to the interbody fusion. We used the trial spacers to determine the appropriate size for the interbody prosthesis. We then pre-filled prosthesis with a combination of local morcellized autograft bone that we obtained during decompression as well as Kinnex bone graft extender. We then inserted the prosthesis into the distracted interspace at C5-6. We then removed the distraction screws. There was a good snug fit of the prosthesis in the interspace.  Having completed the fusion we now turned attention to the anterior spinal instrumentation. We used the high-speed drill to drill away some anterior spondylosis at the disc spaces so  that the plate lay down flat. We selected the appropriate length titanium anterior cervical  plate. We laid it along the anterior aspect of the vertebral bodies from C5-6. We then drilled 12 mm holes at C5 and C6. We then secured the plate to the vertebral bodies by placing two 12 mm self-tapping screws at C5 and C6. We then obtained intraoperative radiograph. The demonstrating good position of the instrumentation. We therefore secured the screws the plate the locking each cam. This completed the instrumentation.  We then obtained hemostasis using bipolar electrocautery. We irrigated the wound out with bacitracin solution. We then removed the retractor. We inspected the esophagus for any damage. There was none apparent. We then reapproximated patient's platysmal muscle with interrupted 3-0 Vicryl suture. We then reapproximated the subcutaneous tissue with interrupted 3-0 Vicryl suture. The skin was reapproximated with Steri-Strips and benzoin. The wound was then covered with bacitracin ointment. A sterile dressing was applied. The drapes were removed. Patient was subsequently extubated by the anesthesia team and transported to the post anesthesia care unit in stable condition. All sponge instrument and needle counts were reportedly correct at the end of this case.

## 2014-12-02 NOTE — H&P (Signed)
Subjective: The patient is a 54 year old white female who has complained of neck and arm pain consistent with a cervical radiculopathy. She has failed medical management and was worked up with a cervical myelo CT.Marland Kitchen This demonstrated cervical spondylosis and stenosis at C5-6. I discussed the various treatment options with the patient including surgery. She has decided to proceed with a C5-6 anterior cervical discectomy, fusion, and plating.   Past Medical History  Diagnosis Date  . Atrial fibrillation (HCC)     Paroxysmal  . Essential hypertension, benign   . Type 2 diabetes mellitus ([place])   . History of cardiac catheterization     Normal coronary arteries 11/12  . Esophageal spasm     NTG and Norvasc  . Fibromyalgia   . Irritable bowel syndrome   . COPD (chronic obstructive pulmonary disease) ([place])   . Lymphedema   . GERD (gastroesophageal reflux disease)   . DDD (degenerative disc disease)   . Osteoarthritis   . Gastroparesis   . Urinary tract infection   . PONV (postoperative nausea and vomiting)   . Dysrhythmia   . Depression   . Brain tumor ([place])     pituitary  . Sleep apnea     bi pap  . Asthma   . Chronic kidney disease     self caths 4xdaily        bladder doesnot empty    Past Surgical History  Procedure Laterality Date  . Back surgery    . Cholecystectomy    . Appendectomy    . Abdominal hysterectomy    . Lung biopsy    . Knee surgery    . Esophagogastroduodenoscopy  10/12/2004    [id number] plaquing on the esophageal mucosa of uncertain significance, not typical of what is seen with candida esophagitis status post KOH brushing for KOH prep and biopsy for histology. Rule out candida esophagitis/eosinophilic esophagitis. Otherwise normal esophagus. Tiny hiatal hernia. Otherwise, normal stomach, normal D1 and D2. Benign biopsy of esophagus, unknown KOH status.   . Colonoscopy      Approximately 2003. Per medical records, internal hemorrhoids noted  . Colonoscopy  N/A 06/25/2013    Dr. Gala Romney: Anal canal hemorrhoids-more likely the source of paper hematochezia. Redundant, capacious colon. Multiple colonic polyps-tubular adenoma  . Esophagogastroduodenoscopy N/A 06/25/2013    Dr. Rourk:mild chronic gastritis  . Left heart catheterization with coronary angiogram N/A 11/15/2010    Procedure: LEFT HEART CATHETERIZATION WITH CORONARY ANGIOGRAM;  Surgeon: Laverda Page, MD;  Location: [facility] CATH LAB;  Service: Cardiovascular;  Laterality: N/A;  . Cardioversion N/A 03/26/2014    Procedure: CARDIOVERSION;  Surgeon: Herminio Commons, MD;  Location: [facility] ORS;  Service: Endoscopy;  Laterality: N/A;  . Cardioversion N/A 05/04/2014    Procedure: CARDIOVERSION;  Surgeon: Satira Sark, MD;  Location: [facility] ORS;  Service: Cardiovascular;  Laterality: N/A;  . Cardiac catheterization      Allergies  Allergen Reactions  . Levofloxacin Other (See Comments)  . Hyoscyamine Sulfate Other (See Comments)    TACHYCARDIA  . Reglan [Metoclopramide] Other (See Comments)    tremors    Social History  Substance Use Topics  . Smoking status: Former Smoker -- 1.50 packs/day for 30 years    Types: Cigarettes    Start date: 03/06/1977    Quit date: 01/03/2007  . Smokeless tobacco: Never Used     Comment: Quit x 6 years  . Alcohol Use: No    Family History  Problem Relation Age of Onset  .  Coronary artery disease Father     Died age 78  . Heart attack Father   . Arrhythmia Father     AF  . Arrhythmia Mother     AF  . Arrhythmia Brother     AF  . Colon cancer Maternal Grandfather   . Colon cancer Paternal Aunt   . Colon cancer Paternal Aunt   . Stroke Mother   . Dementia Mother   . Cancer Mother     UTERINE   . Diabetes Father   . Diabetes Son   . Cancer Sister     BREAST   . Parkinson's disease Mother   . Parkinson's disease Father    Prior to Admission medications   Medication Sig Start Date End Date Taking? Authorizing Provider  albuterol (PROAIR HFA) 108  (90 BASE) MCG/ACT inhaler Inhale 2 puffs into the lungs every 6 (six) hours as needed for wheezing or shortness of breath.    Yes Historical Provider, MD  albuterol (PROVENTIL) (2.5 MG/3ML) 0.083% nebulizer solution Take 2.5 mg by nebulization every 6 (six) hours as needed for wheezing. For shortness of breath.   Yes Historical Provider, MD  baclofen (LIORESAL) 20 MG tablet Take 20 mg by mouth 3 (three) times daily.     Yes Historical Provider, MD  buPROPion (WELLBUTRIN) 75 MG tablet Take 75 mg by mouth daily.    Yes Historical Provider, MD  diazepam (VALIUM) 5 MG tablet Take 5 mg by mouth every 12 (twelve) hours as needed for anxiety. For anxiety.   Yes Historical Provider, MD  diclofenac sodium (VOLTAREN) 1 % GEL Apply 2-4 g topically 4 (four) times daily as needed. For pain   Yes Historical Provider, MD  dicyclomine (BENTYL) 10 MG capsule Take 1 capsule (10 mg total) by mouth 4 (four) times daily -  before meals and at bedtime. For abdominal cramping and loose stools Patient taking differently: Take 10 mg by mouth 4 (four) times daily as needed. For abdominal cramping and loose stools 06/05/13  Yes Orvil Feil, NP  diltiazem (CARDIZEM CD) 120 MG 24 hr capsule Take 1 capsule (120 mg total) by mouth daily. 05/12/14  Yes Satira Sark, MD  esomeprazole (NEXIUM) 40 MG capsule Take 40 mg by mouth daily before breakfast.   Yes Historical Provider, MD  flecainide (TAMBOCOR) 50 MG tablet TAKE 2 TABLETS BY MOUTH TWICE DAILY 05/29/14  Yes Satira Sark, MD  furosemide (LASIX) 80 MG tablet Take 80 mg by mouth 2 (two) times daily.     Yes Historical Provider, MD  gabapentin (NEURONTIN) 600 MG tablet Take 600 mg by mouth 4 (four) times daily.   Yes Historical Provider, MD  isosorbide mononitrate (IMDUR) 120 MG 24 hr tablet Take 120 mg by mouth every morning.    Yes Historical Provider, MD  isosorbide mononitrate (IMDUR) 30 MG 24 hr tablet Take 30 mg by mouth every evening.    Yes Historical Provider, MD   lisinopril (PRINIVIL,ZESTRIL) 2.5 MG tablet Take 2.5 mg by mouth at bedtime.  10/29/12  Yes Historical Provider, MD  loratadine (CLARITIN) 10 MG tablet Take 10 mg by mouth daily.     Yes Historical Provider, MD  metFORMIN (GLUCOPHAGE) 500 MG tablet Take 500 mg by mouth daily. 05/18/13  Yes Historical Provider, MD  nitroGLYCERIN (NITROLINGUAL) 0.4 MG/SPRAY spray Place 1 spray under the tongue as needed. Chest pain Patient taking differently: Place 1 spray under the tongue as needed for chest pain. Chest pain 07/08/14  Yes Satira Sark, MD  nystatin (MYCOSTATIN/NYSTOP) 100000 UNIT/GM POWD Apply 1 g topically daily.    Yes Historical Provider, MD  OPANA ER, CRUSH RESISTANT, 20 MG T12A Take 1 tablet by mouth 2 (two) times daily. 04/10/12  Yes Historical Provider, MD  oxyCODONE-acetaminophen (PERCOCET) 10-325 MG per tablet Take 1 tablet by mouth every 6 (six) hours as needed. For pain.   Yes Historical Provider, MD  polyethylene glycol powder (GLYCOLAX/MIRALAX) powder Take 17 g by mouth daily as needed for mild constipation.  08/25/13  Yes Historical Provider, MD  potassium chloride SA (K-DUR,KLOR-CON) 20 MEQ tablet Take 40 mEq by mouth 4 (four) times daily.     Yes Historical Provider, MD  Rivaroxaban (XARELTO) 20 MG TABS Take 20 mg by mouth daily.   Yes Historical Provider, MD  spironolactone (ALDACTONE) 50 MG tablet Take 50 mg by mouth 3 (three) times daily.     Yes Historical Provider, MD  amoxicillin (AMOXIL) 500 MG capsule Take 500 mg by mouth as needed. As needed prior to dental work 10/10/13   Historical Provider, MD  hydrocortisone (PROCTOSOL HC) 2.5 % rectal cream Place 1 application rectally 2 (two) times daily. Patient taking differently: Place 1 application rectally daily as needed for hemorrhoids.  08/20/13   Orvil Feil, NP  hydrOXYzine (ATARAX/VISTARIL) 25 MG tablet Take 25 mg by mouth 2 (two) times daily as needed for anxiety or itching.     Historical Provider, MD  meclizine (ANTIVERT) 25  MG tablet Take 25 mg by mouth 2 (two) times daily as needed for dizziness.  07/31/13   Historical Provider, MD  NON FORMULARY BIPAP - STARTED ABOUT 1 1/2 years AGO.    Historical Provider, MD     Review of Systems  Positive ROS: As above  All other systems have been reviewed and were otherwise negative with the exception of those mentioned in the HPI and as above.  Objective: Vital signs in last 24 hours: Temp:  [98.9 F (37.2 C)] 98.9 F (37.2 C) (11/30 1158) Pulse Rate:  [64-80] 64 (11/30 1158) Resp:  [20] 20 (11/30 1158) BP: (101-120)/(56-71) 120/56 mmHg (11/30 1158) SpO2:  [98 %] 98 % (11/30 1158) Weight:  [138.347 kg (305 lb)] 138.347 kg (305 lb) (11/30 1158)  General Appearance: Alert, cooperative, no distress, Head: Normocephalic, without obvious abnormality, atraumatic Eyes: PERRL, conjunctiva/corneas clear, EOM's intact,    Ears: Normal  Throat: Normal  Neck: Supple, symmetrical, trachea midline, no adenopathy; thyroid: No enlargement/tenderness/nodules; no carotid bruit or JVD Back: Symmetric, no curvature, ROM normal, no CVA tenderness. The patient's lumbar incision is well-healed. Lungs: Clear to auscultation bilaterally, respirations unlabored Heart: Regular rate and rhythm, no murmur, rub or gallop Abdomen: Soft, non-tender,, no masses, no organomegaly Extremities: Extremities normal, atraumatic, no cyanosis or edema Pulses: 2+ and symmetric all extremities Skin: Skin color, texture, turgor normal, no rashes or lesions  NEUROLOGIC:   Mental status: alert and oriented, no aphasia, good attention span, Fund of knowledge/ memory ok Motor Exam - grossly normal Sensory Exam - grossly normal Reflexes:  Coordination - grossly normal Gait - grossly normal Balance - grossly normal Cranial Nerves: I: smell Not tested  II: visual acuity  OS: Normal  OD: Normal   II: visual fields Full to confrontation  II: pupils Equal, round, reactive to light  III,VII: ptosis None   III,IV,VI: extraocular muscles  Full ROM  V: mastication Normal  V: facial light touch sensation  Normal  V,VII: corneal reflex  Present  VII: facial muscle function - upper  Normal  VII: facial muscle function - lower Normal  VIII: hearing Not tested  IX: soft palate elevation  Normal  IX,X: gag reflex Present  XI: trapezius strength  5/5  XI: sternocleidomastoid strength 5/5  XI: neck flexion strength  5/5  XII: tongue strength  Normal    Data Review Lab Results  Component Value Date   WBC 10.3 11/23/2014   HGB 12.9 11/23/2014   HCT 38.1 11/23/2014   MCV 86.2 11/23/2014   PLT 297 11/23/2014   Lab Results  Component Value Date   NA 134* 11/23/2014   K 4.0 11/23/2014   CL 98* 11/23/2014   CO2 29 11/23/2014   BUN 9 11/23/2014   CREATININE 0.58 11/23/2014   GLUCOSE 119* 11/23/2014   No results found for: INR, PROTIME  Assessment/Plan: C5-6 disc degeneration, spondylosis, stenosis, cervicalgia, cervical radiculopathy: I have discussed the situation with the patient and reviewed her imaging studies with her. We have discussed the various treatment options including surgery. I have described the surgical treatment option of a C5-6 anterior cervical discectomy, fusion, and plating. I have shown her surgical models. We have discussed the risks, benefits, alternatives, and likelihood of achieving her goals with surgery. I have answered all patient's questions. She has decided proceed with surgery.   [name] [name] 12/02/2014 2:15 PM

## 2014-12-02 NOTE — Anesthesia Preprocedure Evaluation (Signed)
Anesthesia Evaluation  Patient identified by MRN, date of birth, ID band  Reviewed: Allergy & Precautions, NPO status   History of Anesthesia Complications (+) PONV  Airway Mallampati: II  TM Distance: >3 FB Neck ROM: Full    Dental   Pulmonary sleep apnea , COPD, former smoker,    Pulmonary exam normal        Cardiovascular hypertension, Pt. on medications Normal cardiovascular exam+ dysrhythmias Atrial Fibrillation      Neuro/Psych    GI/Hepatic GERD  Medicated and Controlled,  Endo/Other  diabetes, Type 2, Oral Hypoglycemic Agents  Renal/GU      Musculoskeletal   Abdominal   Peds  Hematology   Anesthesia Other Findings   Reproductive/Obstetrics                             Anesthesia Physical Anesthesia Plan  ASA: III  Anesthesia Plan: General   Post-op Pain Management:    Induction: Intravenous  Airway Management Planned: Oral ETT  Additional Equipment:   Intra-op Plan:   Post-operative Plan: Extubation in OR  Informed Consent: I have reviewed the patients History and Physical, chart, labs and discussed the procedure including the risks, benefits and alternatives for the proposed anesthesia with the patient or authorized representative who has indicated his/her understanding and acceptance.     Plan Discussed with: CRNA and Surgeon  Anesthesia Plan Comments:         Anesthesia Quick Evaluation

## 2014-12-03 ENCOUNTER — Encounter (HOSPITAL_COMMUNITY): Payer: Self-pay | Admitting: Neurosurgery

## 2014-12-03 LAB — GLUCOSE, CAPILLARY
Glucose-Capillary: 259 mg/dL — ABNORMAL HIGH (ref 65–99)
Glucose-Capillary: 255 mg/dL — ABNORMAL HIGH (ref 65–99)
Glucose-Capillary: 227 mg/dL — ABNORMAL HIGH (ref 65–99)

## 2014-12-03 MED ORDER — INSULIN ASPART 100 UNIT/ML ~~LOC~~ SOLN
0.0000 [IU] | Freq: Three times a day (TID) | SUBCUTANEOUS | Status: DC
  Administered 2014-12-03: 7 [IU] via SUBCUTANEOUS
  Administered 2014-12-03 (×2): 11 [IU] via SUBCUTANEOUS

## 2014-12-03 MED ORDER — OXYCODONE-ACETAMINOPHEN 10-325 MG PO TABS: 1.0000 | ORAL_TABLET | ORAL | Status: DC | PRN

## 2014-12-03 NOTE — Progress Notes (Signed)
Pt arrived to unit via bed from PACU.  Pt alert and able to move from stretcher to unit bed.  Pt complains of pain 5/10. Vitals and assessment stable, see flow sheet.  Bed low, call bell within reach and bed alarm set.  Will continue to monitor.

## 2014-12-03 NOTE — Discharge Summary (Signed)
Physician Discharge Summary  Patient ID: [name] [name] MRN: [id number] DOB/AGE: Sep 09, 1960 54 y.o.  Admit date: 12/02/2014 Discharge date: 12/03/2014  Admission Diagnoses:C5-6 disc degeneration, spondylosis, stenosis, cervicalgia, cervical radiculopathy  Discharge Diagnoses: the same Active Problems:   Cervical spondylosis with radiculopathy   Discharged Condition: good  Hospital Course: I performed a C5-6 anterior cervical discectomy, fusion, and plating on the patient on 12/02/2014. The surgery went well.  The patient's postoperative course was unremarkable. On postoperative day #1 the patient requested discharge to home. The patient, and her friend, were given oral and written discharge instructions. All their questions were answered.  Consults:none Significant Diagnostic Studies:none Treatments:C5-6 anterior cervical discectomy, fusion, and plating. Discharge Exam: Blood pressure 100/38, pulse 94, temperature 98 F (36.7 C), temperature source Oral, resp. rate 20, height 5\' 9"  (1.753 m), weight 140.57 kg (309 lb 14.4 oz), SpO2 80 %. The patient is alert and pleasant. She looks well. Her dressing is clean and dry. There is no hematoma or shift. Her strength is normal in all 4 extremities.  Disposition: home  Discharge Instructions    Call MD for:  difficulty breathing, headache or visual disturbances    Complete by:  As directed      Call MD for:  extreme fatigue    Complete by:  As directed      Call MD for:  hives    Complete by:  As directed      Call MD for:  persistant dizziness or light-headedness    Complete by:  As directed      Call MD for:  persistant nausea and vomiting    Complete by:  As directed      Call MD for:  redness, tenderness, or signs of infection (pain, swelling, redness, odor or green/yellow discharge around incision site)    Complete by:  As directed      Call MD for:  severe uncontrolled pain    Complete by:  As directed      Call MD for:   temperature >100.4    Complete by:  As directed      Diet - low sodium heart healthy    Complete by:  As directed      Discharge instructions    Complete by:  As directed   Call [phone] for a followup appointment. Take a stool softener while you are using pain medications.     Driving Restrictions    Complete by:  As directed   Do not drive for 2 weeks.     Increase activity slowly    Complete by:  As directed      Lifting restrictions    Complete by:  As directed   Do not lift more than 5 pounds. No excessive bending or twisting.     May shower / Bathe    Complete by:  As directed   He may shower after the pain she is removed 3 days after surgery. Leave the incision alone.     Remove dressing in 48 hours    Complete by:  As directed   Your stitches are under the scan and will dissolve by themselves. The Steri-Strips will fall off after you take a few showers. Do not rub back or pick at the wound, Leave the wound alone.            Medication List    TAKE these medications        albuterol (2.5 MG/3ML) 0.083% nebulizer solution  Commonly  known as:  PROVENTIL  Take 2.5 mg by nebulization every 6 (six) hours as needed for wheezing. For shortness of breath.     PROAIR HFA 108 (90 BASE) MCG/ACT inhaler  Generic drug:  albuterol  Inhale 2 puffs into the lungs every 6 (six) hours as needed for wheezing or shortness of breath.     amoxicillin 500 MG capsule  Commonly known as:  AMOXIL  Take 500 mg by mouth as needed. As needed prior to dental work     baclofen 20 MG tablet  Commonly known as:  LIORESAL  Take 20 mg by mouth 3 (three) times daily.     diazepam 5 MG tablet  Commonly known as:  VALIUM  Take 5 mg by mouth every 12 (twelve) hours as needed for anxiety. For anxiety.     dicyclomine 10 MG capsule  Commonly known as:  BENTYL  Take 1 capsule (10 mg total) by mouth 4 (four) times daily -  before meals and at bedtime. For abdominal cramping and loose stools      diltiazem 120 MG 24 hr capsule  Commonly known as:  CARDIZEM CD  Take 1 capsule (120 mg total) by mouth daily.     esomeprazole 40 MG capsule  Commonly known as:  NEXIUM  Take 40 mg by mouth daily before breakfast.     flecainide 50 MG tablet  Commonly known as:  TAMBOCOR  TAKE 2 TABLETS BY MOUTH TWICE DAILY     furosemide 80 MG tablet  Commonly known as:  LASIX  Take 80 mg by mouth 2 (two) times daily.     gabapentin 600 MG tablet  Commonly known as:  NEURONTIN  Take 600 mg by mouth 4 (four) times daily.     hydrocortisone 2.5 % rectal cream  Commonly known as:  PROCTOSOL HC  Place 1 application rectally 2 (two) times daily.     hydrOXYzine 25 MG tablet  Commonly known as:  ATARAX/VISTARIL  Take 25 mg by mouth 2 (two) times daily as needed for anxiety or itching.     isosorbide mononitrate 120 MG 24 hr tablet  Commonly known as:  IMDUR  Take 120 mg by mouth every morning.     isosorbide mononitrate 30 MG 24 hr tablet  Commonly known as:  IMDUR  Take 30 mg by mouth every evening.     lisinopril 2.5 MG tablet  Commonly known as:  PRINIVIL,ZESTRIL  Take 2.5 mg by mouth at bedtime.     loratadine 10 MG tablet  Commonly known as:  CLARITIN  Take 10 mg by mouth daily.     meclizine 25 MG tablet  Commonly known as:  ANTIVERT  Take 25 mg by mouth 2 (two) times daily as needed for dizziness.     metFORMIN 500 MG tablet  Commonly known as:  GLUCOPHAGE  Take 500 mg by mouth daily.     nitroGLYCERIN 0.4 MG/SPRAY spray  Commonly known as:  NITROLINGUAL  Place 1 spray under the tongue as needed. Chest pain     NON FORMULARY  BIPAP - STARTED ABOUT 1 1/2 years AGO.     nystatin 100000 UNIT/GM Powd  Apply 1 g topically daily.     OPANA ER (CRUSH RESISTANT) 20 MG T12a  Generic drug:  Oxymorphone HCl (Crush Resist)  Take 1 tablet by mouth 2 (two) times daily.     oxyCODONE-acetaminophen 10-325 MG tablet  Commonly known as:  PERCOCET  Take 1 tablet by mouth  every 6  (six) hours as needed. For pain.     oxyCODONE-acetaminophen 10-325 MG tablet  Commonly known as:  PERCOCET  Take 1 tablet by mouth every 4 (four) hours as needed for pain.     polyethylene glycol powder powder  Commonly known as:  GLYCOLAX/MIRALAX  Take 17 g by mouth daily as needed for mild constipation.     potassium chloride SA 20 MEQ tablet  Commonly known as:  K-DUR,KLOR-CON  Take 40 mEq by mouth 4 (four) times daily.     spironolactone 50 MG tablet  Commonly known as:  ALDACTONE  Take 50 mg by mouth 3 (three) times daily.     VOLTAREN 1 % Gel  Generic drug:  diclofenac sodium  Apply 2-4 g topically 4 (four) times daily as needed. For pain     WELLBUTRIN 75 MG tablet  Generic drug:  buPROPion  Take 75 mg by mouth daily.     XARELTO 20 MG Tabs tablet  Generic drug:  rivaroxaban  Take 20 mg by mouth daily.         SignedOphelia Charter 12/03/2014, 5:49 PM

## 2014-12-03 NOTE — Progress Notes (Signed)
Pt to discharge home with sister. Discharge instructions given. IV discontinued. Pt to leave unit via wheelchair with RN. Wendee Copp

## 2014-12-15 ENCOUNTER — Encounter: Payer: Self-pay | Admitting: Cardiology

## 2014-12-15 ENCOUNTER — Encounter: Payer: Medicare Other | Admitting: Cardiology

## 2014-12-15 NOTE — Progress Notes (Signed)
Patient canceled.  This encounter was created in error - please disregard. 

## 2014-12-25 ENCOUNTER — Other Ambulatory Visit: Payer: Self-pay | Admitting: Cardiology

## 2015-01-01 ENCOUNTER — Encounter: Payer: Self-pay | Admitting: Cardiology

## 2015-01-01 ENCOUNTER — Ambulatory Visit (INDEPENDENT_AMBULATORY_CARE_PROVIDER_SITE_OTHER): Payer: Medicare Other | Admitting: Cardiology

## 2015-01-01 VITALS — BP 119/71 | HR 74 | Ht 69.0 in | Wt 310.6 lb

## 2015-01-01 DIAGNOSIS — I480 Paroxysmal atrial fibrillation: Secondary | ICD-10-CM

## 2015-01-01 DIAGNOSIS — I10 Essential (primary) hypertension: Secondary | ICD-10-CM | POA: Diagnosis not present

## 2015-01-01 NOTE — Progress Notes (Signed)
Cardiology Office Note  Date: 01/01/2015   ID: [name] [name], DOB [birth year], MRN [id number]  PCP: Glenda Chroman., MD  Primary Cardiologist: Rozann Lesches, MD   Chief Complaint  Patient presents with  . PAF    History of Present Illness: [name] [name] is a 54 y.o. female last seen in August. She presents for a routine follow-up visit. Overall she has been stable from a cardiac perspective, only has occasional sense of palpitations. She has not had any documented persistent atrial fibrillation since earlier in the year.  Records reviewed, she underwent cervical spine surgery in November with Dr. Arnoldo Morale. There were no perioperative cardiac complications noted. I reviewed her most recent lab work and ECG from November.  Her weight has been going up, she admits that she has not been very active both related to her surgery and also after the passing of her mother. She would like to try to get back to a walking plan.  Past Medical History  Diagnosis Date  . Atrial fibrillation (HCC)     Paroxysmal  . Essential hypertension, benign   . Type 2 diabetes mellitus ([place])   . History of cardiac catheterization     Normal coronary arteries 11/12  . Esophageal spasm     NTG and Norvasc  . Fibromyalgia   . Irritable bowel syndrome   . COPD (chronic obstructive pulmonary disease) ([place])   . Lymphedema   . GERD (gastroesophageal reflux disease)   . DDD (degenerative disc disease)   . Osteoarthritis   . Gastroparesis   . Urinary tract infection   . Depression   . History of pituitary tumor   . Sleep apnea     BIPAP  . Bladder dysfunction     Self urinary catheterization    Current Outpatient Prescriptions  Medication Sig Dispense Refill  . albuterol (PROAIR HFA) 108 (90 BASE) MCG/ACT inhaler Inhale 2 puffs into the lungs every 6 (six) hours as needed for wheezing or shortness of breath.     Marland Kitchen albuterol (PROVENTIL) (2.5 MG/3ML) 0.083% nebulizer solution Take 2.5 mg by  nebulization every 6 (six) hours as needed for wheezing. For shortness of breath.    Marland Kitchen amoxicillin (AMOXIL) 500 MG capsule Take 500 mg by mouth as needed. As needed prior to dental work    . baclofen (LIORESAL) 20 MG tablet Take 20 mg by mouth 3 (three) times daily.      Marland Kitchen buPROPion (WELLBUTRIN) 75 MG tablet Take 75 mg by mouth daily.     . diazepam (VALIUM) 5 MG tablet Take 5 mg by mouth every 12 (twelve) hours as needed for anxiety. For anxiety.    . diclofenac sodium (VOLTAREN) 1 % GEL Apply 2-4 g topically 4 (four) times daily as needed. For pain    . dicyclomine (BENTYL) 10 MG capsule Take 1 capsule (10 mg total) by mouth 4 (four) times daily -  before meals and at bedtime. For abdominal cramping and loose stools (Patient taking differently: Take 10 mg by mouth 4 (four) times daily as needed. For abdominal cramping and loose stools) 120 capsule 5  . diltiazem (CARDIZEM CD) 120 MG 24 hr capsule Take 1 capsule (120 mg total) by mouth daily. 90 capsule 3  . esomeprazole (NEXIUM) 40 MG capsule Take 40 mg by mouth daily before breakfast.    . flecainide (TAMBOCOR) 50 MG tablet TAKE 2 TABLETS BY MOUTH TWICE DAILY 120 tablet 6  . furosemide (LASIX) 80 MG tablet Take 80  mg by mouth 2 (two) times daily.      Marland Kitchen gabapentin (NEURONTIN) 600 MG tablet Take 600 mg by mouth 4 (four) times daily.    . hydrocortisone (PROCTOSOL HC) 2.5 % rectal cream Place 1 application rectally 2 (two) times daily. (Patient taking differently: Place 1 application rectally daily as needed for hemorrhoids. ) 30 g 0  . isosorbide mononitrate (IMDUR) 120 MG 24 hr tablet Take 120 mg by mouth every morning.     . isosorbide mononitrate (IMDUR) 30 MG 24 hr tablet Take 30 mg by mouth every evening.     Marland Kitchen lisinopril (PRINIVIL,ZESTRIL) 2.5 MG tablet Take 2.5 mg by mouth at bedtime.     Marland Kitchen loratadine (CLARITIN) 10 MG tablet Take 10 mg by mouth daily.      . meclizine (ANTIVERT) 25 MG tablet Take 25 mg by mouth 2 (two) times daily as needed  for dizziness.     . metFORMIN (GLUCOPHAGE) 500 MG tablet Take 500 mg by mouth daily.    . nitroGLYCERIN (NITROLINGUAL) 0.4 MG/SPRAY spray Place 1 spray under the tongue as needed. Chest pain (Patient taking differently: Place 1 spray under the tongue as needed for chest pain. Chest pain) 12 g 3  . NON FORMULARY BIPAP - STARTED ABOUT 1 1/2 years AGO.    Marland Kitchen nystatin (MYCOSTATIN/NYSTOP) 100000 UNIT/GM POWD Apply 1 g topically daily.     . OPANA ER, CRUSH RESISTANT, 20 MG T12A Take 1 tablet by mouth 2 (two) times daily.    Marland Kitchen oxyCODONE-acetaminophen (PERCOCET) 10-325 MG per tablet Take 1 tablet by mouth every 6 (six) hours as needed. For pain.    . polyethylene glycol powder (GLYCOLAX/MIRALAX) powder Take 17 g by mouth daily as needed for mild constipation.     . potassium chloride SA (K-DUR,KLOR-CON) 20 MEQ tablet Take 40 mEq by mouth 4 (four) times daily.      . Rivaroxaban (XARELTO) 20 MG TABS Take 20 mg by mouth daily.    Marland Kitchen spironolactone (ALDACTONE) 50 MG tablet Take 50 mg by mouth 3 (three) times daily.       No current facility-administered medications for this visit.   Allergies:  Levofloxacin; Hyoscyamine sulfate; and Reglan   Social History: The patient  reports that she quit smoking about 8 years ago. Her smoking use included Cigarettes. She started smoking about 37 years ago. She has a 45 pack-year smoking history. She has never used smokeless tobacco. She reports that she does not drink alcohol or use illicit drugs.   ROS:  Please see the history of present illness. Otherwise, complete review of systems is positive for chronic back pain, uses a cane, neck brace in place.  All other systems are reviewed and negative.   Physical Exam: VS:  BP 119/71 mmHg  Pulse 74  Ht 5\' 9"  (1.753 m)  Wt 310 lb 9.6 oz (140.887 kg)  BMI 45.85 kg/m2  SpO2 97%, BMI Body mass index is 45.85 kg/(m^2).  Wt Readings from Last 3 Encounters:  01/01/15 310 lb 9.6 oz (140.887 kg)  12/02/14 309 lb 14.4 oz  (140.57 kg)  11/23/14 305 lb 6.4 oz (138.529 kg)    Obese woman in no acute distress. Neck brace in place. HEENT: Conjunctiva and lids normal, oropharynx clear.  Neck: No elevated JVP or carotid bruits.  Lungs: Clear to auscultation, nonlabored breathing at rest.  Cardiac: RRR, no S3 or significant systolic murmur, no pericardial rub.  Abdomen: Soft, nontender, bowel sounds present.  Extremities: Mild  edema, distal pulses 2+. Uses a cane to walk.  ECG: Tracing from 12/01/2014 showed normal sinus rhythm with left anterior fascicular block, QRS duration 116 ms..  Recent Labwork: 11/23/2014: BUN 9; Creatinine, Ser 0.58; Hemoglobin 12.9; Platelets 297; Potassium 4.0; Sodium 134*   Assessment and Plan:  1. Paroxysmal atrial fibrillation, maintaining sinus rhythm at this time on medical therapy which includes Cardizem CD, flecainide, and Xarelto. No changes were made today.  2. History of recurrent chest pain with previously documented normal coronary arteries and low risk follow-up noninvasive cardiac evaluation. She continues on Imdur for possible endothelial dysfunction. No recently worsening symptoms.  3. Essential hypertension, blood pressure is well controlled today.  Current medicines were reviewed with the patient today.  Disposition: FU with me in 4 months.   Signed, Satira Sark, MD, [facility] 01/01/2015 2:53 PM    [facility] [place], [place], [place] [address] Phone: [phone]; Fax: [phone]

## 2015-01-01 NOTE — Patient Instructions (Signed)
Your physician recommends that you schedule a follow-up appointment in: 4 months with Dr. McDowell  Your physician recommends that you continue on your current medications as directed. Please refer to the Current Medication list given to you today.  Thank you for choosing [facility]!    

## 2015-01-05 ENCOUNTER — Ambulatory Visit (INDEPENDENT_AMBULATORY_CARE_PROVIDER_SITE_OTHER): Payer: Medicare Other | Admitting: Psychiatry

## 2015-01-05 ENCOUNTER — Encounter (HOSPITAL_COMMUNITY): Payer: Self-pay | Admitting: Psychiatry

## 2015-01-05 VITALS — BP 103/52 | HR 67 | Ht 69.0 in | Wt 305.2 lb

## 2015-01-05 DIAGNOSIS — F329 Major depressive disorder, single episode, unspecified: Secondary | ICD-10-CM | POA: Diagnosis not present

## 2015-01-05 DIAGNOSIS — F32A Depression, unspecified: Secondary | ICD-10-CM

## 2015-01-05 MED ORDER — DULOXETINE HCL 30 MG PO CPEP: 30.0000 mg | ORAL_CAPSULE | Freq: Every day | ORAL | Status: DC

## 2015-01-05 MED ORDER — BUPROPION HCL 75 MG PO TABS: 75.0000 mg | ORAL_TABLET | Freq: Every day | ORAL | Status: DC
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
## 2015-01-22 ENCOUNTER — Other Ambulatory Visit: Payer: Self-pay | Admitting: Internal Medicine

## 2015-01-22 DIAGNOSIS — N8111 Cystocele, midline: Secondary | ICD-10-CM | POA: Diagnosis not present

## 2015-01-22 DIAGNOSIS — Z0000 Encounter for general adult medical examination without abnormal findings: Secondary | ICD-10-CM | POA: Diagnosis not present

## 2015-01-22 DIAGNOSIS — R338 Other retention of urine: Secondary | ICD-10-CM | POA: Diagnosis not present

## 2015-01-25 DIAGNOSIS — G894 Chronic pain syndrome: Secondary | ICD-10-CM | POA: Diagnosis not present

## 2015-01-25 DIAGNOSIS — M5412 Radiculopathy, cervical region: Secondary | ICD-10-CM | POA: Diagnosis not present

## 2015-01-25 DIAGNOSIS — M4806 Spinal stenosis, lumbar region: Secondary | ICD-10-CM | POA: Diagnosis not present

## 2015-01-25 DIAGNOSIS — Z79891 Long term (current) use of opiate analgesic: Secondary | ICD-10-CM | POA: Diagnosis not present

## 2015-01-27 ENCOUNTER — Ambulatory Visit (INDEPENDENT_AMBULATORY_CARE_PROVIDER_SITE_OTHER): Payer: Medicare Other | Admitting: Psychiatry

## 2015-01-27 ENCOUNTER — Encounter (HOSPITAL_COMMUNITY): Payer: Self-pay | Admitting: Psychiatry

## 2015-01-27 DIAGNOSIS — F329 Major depressive disorder, single episode, unspecified: Secondary | ICD-10-CM | POA: Diagnosis not present

## 2015-01-27 DIAGNOSIS — F32A Depression, unspecified: Secondary | ICD-10-CM
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
## 2015-01-27 NOTE — Patient Instructions (Signed)
Discussed orally 

## 2015-02-02 ENCOUNTER — Encounter (HOSPITAL_COMMUNITY): Payer: Self-pay | Admitting: Psychiatry

## 2015-02-02 ENCOUNTER — Ambulatory Visit (HOSPITAL_COMMUNITY): Payer: Self-pay | Admitting: Psychiatry

## 2015-02-02 ENCOUNTER — Ambulatory Visit (INDEPENDENT_AMBULATORY_CARE_PROVIDER_SITE_OTHER): Payer: Medicare Other | Admitting: Psychiatry

## 2015-02-02 VITALS — BP 117/59 | HR 66 | Ht 69.0 in | Wt 303.4 lb

## 2015-02-02 DIAGNOSIS — F32A Depression, unspecified: Secondary | ICD-10-CM

## 2015-02-02 DIAGNOSIS — F329 Major depressive disorder, single episode, unspecified: Secondary | ICD-10-CM | POA: Diagnosis not present

## 2015-02-02 MED ORDER — DULOXETINE HCL 60 MG PO CPEP: 60.0000 mg | ORAL_CAPSULE | Freq: Every day | ORAL | Status: DC

## 2015-02-02 MED ORDER — BUPROPION HCL 75 MG PO TABS: 75.0000 mg | ORAL_TABLET | Freq: Every day | ORAL | Status: DC
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
## 2015-02-11 ENCOUNTER — Ambulatory Visit (HOSPITAL_COMMUNITY): Payer: Self-pay | Admitting: Psychiatry

## 2015-02-18 ENCOUNTER — Telehealth (HOSPITAL_COMMUNITY): Payer: Self-pay | Admitting: *Deleted

## 2015-02-18 ENCOUNTER — Other Ambulatory Visit (HOSPITAL_COMMUNITY): Payer: Self-pay | Admitting: Psychiatry

## 2015-02-18 MED ORDER — DULOXETINE HCL 30 MG PO CPEP: 30.0000 mg | ORAL_CAPSULE | Freq: Every day | ORAL | Status: DC

## 2015-02-18 NOTE — Telephone Encounter (Signed)
Refill for 30 mg sent in

## 2015-02-18 NOTE — Telephone Encounter (Signed)
noted 

## 2015-02-18 NOTE — Telephone Encounter (Signed)
phone call from patient her Cymbalta was increased from 30 mg. to 60 mg. She said she can't tolerate the increase due to dizziness, nausea, sweating.   She said she fell because of dizziness.   She said she want to just stay on 30 mg.   She think she has a refill left on the 30 mg.

## 2015-02-22 DIAGNOSIS — E1142 Type 2 diabetes mellitus with diabetic polyneuropathy: Secondary | ICD-10-CM | POA: Diagnosis not present

## 2015-02-22 DIAGNOSIS — M4806 Spinal stenosis, lumbar region: Secondary | ICD-10-CM | POA: Diagnosis not present

## 2015-02-22 DIAGNOSIS — G894 Chronic pain syndrome: Secondary | ICD-10-CM | POA: Diagnosis not present

## 2015-02-22 DIAGNOSIS — Z79891 Long term (current) use of opiate analgesic: Secondary | ICD-10-CM | POA: Diagnosis not present

## 2015-02-24 ENCOUNTER — Ambulatory Visit (HOSPITAL_COMMUNITY): Payer: Self-pay | Admitting: Psychiatry

## 2015-02-24 ENCOUNTER — Telehealth (HOSPITAL_COMMUNITY): Payer: Self-pay | Admitting: *Deleted

## 2015-02-24 NOTE — Telephone Encounter (Signed)
Spoke with pt and informed her that medication was sent into pharmacy and mg was decreased to 30 mg and pt showed understanding.

## 2015-02-24 NOTE — Telephone Encounter (Signed)
Phone call from patient, she wants to know status of her phone call on 02/18/15.    the medicine is not agreeing with her at all.

## 2015-03-12 DIAGNOSIS — R42 Dizziness and giddiness: Secondary | ICD-10-CM | POA: Diagnosis not present

## 2015-03-12 DIAGNOSIS — E1140 Type 2 diabetes mellitus with diabetic neuropathy, unspecified: Secondary | ICD-10-CM | POA: Diagnosis not present

## 2015-03-12 DIAGNOSIS — I10 Essential (primary) hypertension: Secondary | ICD-10-CM | POA: Diagnosis not present

## 2015-03-12 DIAGNOSIS — I2510 Atherosclerotic heart disease of native coronary artery without angina pectoris: Secondary | ICD-10-CM | POA: Diagnosis not present

## 2015-03-16 DIAGNOSIS — M542 Cervicalgia: Secondary | ICD-10-CM | POA: Diagnosis not present

## 2015-03-16 DIAGNOSIS — Z6841 Body Mass Index (BMI) 40.0 and over, adult: Secondary | ICD-10-CM | POA: Diagnosis not present

## 2015-03-16 DIAGNOSIS — I10 Essential (primary) hypertension: Secondary | ICD-10-CM | POA: Diagnosis not present

## 2015-03-17 ENCOUNTER — Ambulatory Visit (INDEPENDENT_AMBULATORY_CARE_PROVIDER_SITE_OTHER): Payer: Medicare Other | Admitting: Psychiatry

## 2015-03-17 ENCOUNTER — Encounter (HOSPITAL_COMMUNITY): Payer: Self-pay | Admitting: Psychiatry

## 2015-03-17 VITALS — BP 114/59 | HR 67 | Ht 69.0 in | Wt 306.8 lb

## 2015-03-17 DIAGNOSIS — F329 Major depressive disorder, single episode, unspecified: Secondary | ICD-10-CM | POA: Diagnosis not present

## 2015-03-17 DIAGNOSIS — F32A Depression, unspecified: Secondary | ICD-10-CM

## 2015-03-17 MED ORDER — BUPROPION HCL ER (XL) 150 MG PO TB24: 150.0000 mg | ORAL_TABLET | ORAL | Status: DC
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
## 2015-03-19 DIAGNOSIS — M4806 Spinal stenosis, lumbar region: Secondary | ICD-10-CM | POA: Diagnosis not present

## 2015-03-19 DIAGNOSIS — G894 Chronic pain syndrome: Secondary | ICD-10-CM | POA: Diagnosis not present

## 2015-03-19 DIAGNOSIS — Z79891 Long term (current) use of opiate analgesic: Secondary | ICD-10-CM | POA: Diagnosis not present

## 2015-03-19 DIAGNOSIS — E1142 Type 2 diabetes mellitus with diabetic polyneuropathy: Secondary | ICD-10-CM | POA: Diagnosis not present

## 2015-03-25 ENCOUNTER — Ambulatory Visit (INDEPENDENT_AMBULATORY_CARE_PROVIDER_SITE_OTHER): Payer: Medicare Other | Admitting: Nurse Practitioner

## 2015-03-25 ENCOUNTER — Encounter: Payer: Self-pay | Admitting: Nurse Practitioner

## 2015-03-25 VITALS — BP 117/75 | HR 66 | Temp 98.0°F | Ht 69.0 in | Wt 303.4 lb

## 2015-03-25 DIAGNOSIS — R110 Nausea: Secondary | ICD-10-CM | POA: Diagnosis not present

## 2015-03-25 DIAGNOSIS — K582 Mixed irritable bowel syndrome: Secondary | ICD-10-CM

## 2015-03-25 DIAGNOSIS — E1142 Type 2 diabetes mellitus with diabetic polyneuropathy: Secondary | ICD-10-CM | POA: Diagnosis not present

## 2015-03-25 DIAGNOSIS — L84 Corns and callosities: Secondary | ICD-10-CM | POA: Diagnosis not present

## 2015-03-25 DIAGNOSIS — K219 Gastro-esophageal reflux disease without esophagitis: Secondary | ICD-10-CM

## 2015-03-25 DIAGNOSIS — B351 Tinea unguium: Secondary | ICD-10-CM | POA: Diagnosis not present

## 2015-03-25 MED ORDER — ONDANSETRON HCL 4 MG PO TABS: 4.0000 mg | ORAL_TABLET | Freq: Three times a day (TID) | ORAL | Status: DC | PRN

## 2015-03-25 NOTE — Progress Notes (Signed)
Referring Provider: Glenda Chroman., MD Primary Care Physician:  Glenda Chroman., MD Primary GI:  Dr. Gala Romney  Chief Complaint  Patient presents with  . Constipation  . Gastroesophageal Reflux    HPI:   [name] [name] is a 55 y.o. female who presents for follow-up on irritable bowel syndrome. Last seen in our office 08/20/2013 at which point it was noted she alternates between diarrhea and constipation with diarrhea more prominent during stressful times. Colonoscopy up-to-date, next due in 2020. Stated she was interested in hemorrhoid banding but determined that she would need to be off Xarelto for 2 days prior to banding and remain off it for 1 week after banding. Message left with this information and at if she is still interested she would need cardiology clearance first. At that time recommended Bentyl when necessary for loose stools, MiraLAX when necessary for constipation, Anusol cream for hemorrhoid symptoms.  Today she states she still has alternating IBS. Hasn't had to use Bentyl in a while, primarily with constipation, but never knows when diarrhea will hit. Miralax works "pretty good" for constipation, will take it twice a day if needed. Has issues cleaning when constipation is worse, and notes inflamed hemorrhoids when that happens. Recently had cervical fusion. Has a history of GERD previously well controlled on Nexium. GERD symptoms worsening post cervical fusion. Remains on Nexium and is also on Zantac in the evenings. Has breakthrough symptoms on average 1-2 times a day. Dependant on diet. States Dr. Gala Romney previously told her she should have surgery for "my flapper and hernia to help the acid reflux." Denies overt hematochezia, occasional scant toilet tissue hematochezia when hemorrhoids are flared up for which she takes Anusol. Denies melena. Also with nausea. Denies chest pain, dyspnea, dizziness, lightheadedness, syncope, near syncope. Denies any other upper or lower GI  symptoms.  Past Medical History  Diagnosis Date  . Atrial fibrillation (HCC)     Paroxysmal  . Essential hypertension, benign   . Type 2 diabetes mellitus ([place])   . History of cardiac catheterization     Normal coronary arteries 11/12  . Esophageal spasm     NTG and Norvasc  . Fibromyalgia   . Irritable bowel syndrome   . COPD (chronic obstructive pulmonary disease) ([place])   . Lymphedema   . GERD (gastroesophageal reflux disease)   . DDD (degenerative disc disease)   . Osteoarthritis   . Gastroparesis   . Urinary tract infection   . Depression   . History of pituitary tumor   . Sleep apnea     BIPAP  . Bladder dysfunction     Self urinary catheterization    Past Surgical History  Procedure Laterality Date  . Back surgery    . Cholecystectomy    . Appendectomy    . Abdominal hysterectomy    . Lung biopsy    . Knee surgery    . Esophagogastroduodenoscopy  10/12/2004    [id number] plaquing on the esophageal mucosa of uncertain significance, not typical of what is seen with candida esophagitis status post KOH brushing for KOH prep and biopsy for histology. Rule out candida esophagitis/eosinophilic esophagitis. Otherwise normal esophagus. Tiny hiatal hernia. Otherwise, normal stomach, normal D1 and D2. Benign biopsy of esophagus, unknown KOH status.   . Colonoscopy      Approximately 2003. Per medical records, internal hemorrhoids noted  . Colonoscopy N/A 06/25/2013    Dr. Gala Romney: Anal canal hemorrhoids-more likely the source of paper hematochezia. Redundant, capacious colon. Multiple  colonic polyps-tubular adenoma  . Esophagogastroduodenoscopy N/A 06/25/2013    Dr. Rourk:mild chronic gastritis  . Left heart catheterization with coronary angiogram N/A 11/15/2010    Procedure: LEFT HEART CATHETERIZATION WITH CORONARY ANGIOGRAM;  Surgeon: Laverda Page, MD;  Location: [facility] CATH LAB;  Service: Cardiovascular;  Laterality: N/A;  . Cardioversion N/A 03/26/2014    Procedure:  CARDIOVERSION;  Surgeon: Herminio Commons, MD;  Location: [facility] ORS;  Service: Endoscopy;  Laterality: N/A;  . Cardioversion N/A 05/04/2014    Procedure: CARDIOVERSION;  Surgeon: Satira Sark, MD;  Location: [facility] ORS;  Service: Cardiovascular;  Laterality: N/A;  . Cardiac catheterization    . Anterior cervical decomp/discectomy fusion N/A 12/02/2014    Procedure: Cervical five cervical six anterior cervical decompression with fusion interbody prosthesis plating and bone graft;  Surgeon: Newman Pies, MD;  Location: [facility] NEURO ORS;  Service: Neurosurgery;  Laterality: N/A;  C56 anterior cervical decompression with fusion interbody prosthesis plating and bonegraft    Current Outpatient Prescriptions  Medication Sig Dispense Refill  . albuterol (PROAIR HFA) 108 (90 BASE) MCG/ACT inhaler Inhale 2 puffs into the lungs every 6 (six) hours as needed for wheezing or shortness of breath.     Marland Kitchen albuterol (PROVENTIL) (2.5 MG/3ML) 0.083% nebulizer solution Take 2.5 mg by nebulization every 6 (six) hours as needed for wheezing. For shortness of breath.    Marland Kitchen atorvastatin (LIPITOR) 10 MG tablet Take 10 mg by mouth at bedtime.  3  . baclofen (LIORESAL) 20 MG tablet Take 20 mg by mouth 3 (three) times daily.      Marland Kitchen buPROPion (WELLBUTRIN XL) 150 MG 24 hr tablet Take 1 tablet (150 mg total) by mouth every morning. 30 tablet 2  . diazepam (VALIUM) 5 MG tablet Take 5 mg by mouth every 12 (twelve) hours as needed for anxiety. For anxiety.    . diclofenac sodium (VOLTAREN) 1 % GEL Apply 2-4 g topically 4 (four) times daily as needed. For pain    . dicyclomine (BENTYL) 10 MG capsule Take 1 capsule (10 mg total) by mouth 4 (four) times daily -  before meals and at bedtime. For abdominal cramping and loose stools (Patient taking differently: Take 10 mg by mouth 4 (four) times daily as needed. For abdominal cramping and loose stools) 120 capsule 5  . diltiazem (CARDIZEM CD) 120 MG 24 hr capsule Take 1 capsule (120 mg  total) by mouth daily. 90 capsule 3  . esomeprazole (NEXIUM) 40 MG capsule Take 40 mg by mouth daily before breakfast.    . flecainide (TAMBOCOR) 50 MG tablet TAKE 2 TABLETS BY MOUTH TWICE DAILY 120 tablet 6  . furosemide (LASIX) 80 MG tablet Take 80 mg by mouth 2 (two) times daily.      Marland Kitchen gabapentin (NEURONTIN) 600 MG tablet Take 600 mg by mouth 4 (four) times daily.    . hydrocortisone (PROCTOSOL HC) 2.5 % rectal cream Place 1 application rectally 2 (two) times daily. (Patient taking differently: Place 1 application rectally daily as needed for hemorrhoids. ) 30 g 0  . isosorbide mononitrate (IMDUR) 120 MG 24 hr tablet Take 120 mg by mouth every morning.     . isosorbide mononitrate (IMDUR) 30 MG 24 hr tablet Take 30 mg by mouth every evening.     Marland Kitchen lisinopril (PRINIVIL,ZESTRIL) 2.5 MG tablet Take 2.5 mg by mouth at bedtime.     Marland Kitchen loratadine (CLARITIN) 10 MG tablet Take 10 mg by mouth daily.      Marland Kitchen  meclizine (ANTIVERT) 25 MG tablet Take 25 mg by mouth 3 (three) times daily.     . metFORMIN (GLUCOPHAGE) 500 MG tablet Take 500 mg by mouth daily.    . NON FORMULARY BIPAP - STARTED ABOUT 1 1/2 years AGO.    Marland Kitchen nystatin (MYCOSTATIN/NYSTOP) 100000 UNIT/GM POWD Apply 1 g topically daily.     . OPANA ER, CRUSH RESISTANT, 20 MG T12A Take 1 tablet by mouth 2 (two) times daily.    Marland Kitchen oxyCODONE-acetaminophen (PERCOCET) 10-325 MG per tablet Take 1 tablet by mouth every 6 (six) hours as needed. For pain.    . polyethylene glycol powder (GLYCOLAX/MIRALAX) powder Take 17 g by mouth daily as needed for mild constipation.     . potassium chloride SA (K-DUR,KLOR-CON) 20 MEQ tablet Take 40 mEq by mouth 4 (four) times daily.      . ranitidine (ZANTAC) 150 MG tablet Take 150 mg by mouth at bedtime.    . Rivaroxaban (XARELTO) 20 MG TABS Take 20 mg by mouth daily.    Marland Kitchen spironolactone (ALDACTONE) 50 MG tablet Take 50 mg by mouth 3 (three) times daily.      . nitroGLYCERIN (NITROLINGUAL) 0.4 MG/SPRAY spray Place 1 spray  under the tongue as needed. Chest pain (Patient not taking: Reported on 03/25/2015) 12 g 3  . ondansetron (ZOFRAN) 4 MG tablet Take 1 tablet (4 mg total) by mouth every 8 (eight) hours as needed for nausea or vomiting. 30 tablet 1   No current facility-administered medications for this visit.    Allergies as of 03/25/2015 - Review Complete 03/25/2015  Allergen Reaction Noted  . Levofloxacin Other (See Comments) 12/01/2014  . Hyoscyamine sulfate Other (See Comments)   . Reglan [metoclopramide] Other (See Comments) 11/05/2014    Family History  Problem Relation Age of Onset  . Coronary artery disease Father     Died age 23  . Heart attack Father   . Arrhythmia Father     AF  . Diabetes Father   . Parkinson's disease Father   . Arrhythmia Mother     AF  . Stroke Mother   . Dementia Mother   . Cancer Mother     UTERINE   . Parkinson's disease Mother   . Arrhythmia Brother     AF  . Colon cancer Maternal Grandfather   . Colon cancer Paternal Aunt   . Colon cancer Paternal Aunt   . Diabetes Son   . Cancer Sister     BREAST   . Depression Sister   . Depression Sister   . Depression Sister     Social History   Social History  . Marital Status: Divorced    Spouse Name: N/A  . Number of Children: N/A  . Years of Education: N/A   Social History Main Topics  . Smoking status: Former Smoker -- 1.50 packs/day for 30 years    Types: Cigarettes    Start date: 03/06/1977    Quit date: 01/03/2007  . Smokeless tobacco: Never Used     Comment: Quit x 6 years  . Alcohol Use: No  . Drug Use: No  . Sexual Activity: No   Other Topics Concern  . None   Social History Narrative    Review of Systems: General: Negative for anorexia, weight loss, fever, chills, fatigue, weakness. ENT: Negative for hoarseness, difficulty swallowing. CV: Negative for chest pain, angina, palpitations,peripheral edema.  Respiratory: Negative for dyspnea at rest, cough, sputum, wheezing.  GI: See  history  of present illness. MS: Chronic neck pain, is s/p cervical fusion.  Derm: Negative for rash or itching.  Endo: Negative for unusual weight change.    Physical Exam: BP 117/75 mmHg  Pulse 66  Temp(Src) 98 F (36.7 C) (Oral)  Ht 5\' 9"  (1.753 m)  Wt 303 lb 6.4 oz (137.621 kg)  BMI 44.78 kg/m2 General:   Alert and oriented. Pleasant and cooperative. Well-nourished and well-developed.  Head:  Normocephalic and atraumatic. Eyes:  Without icterus, sclera clear and conjunctiva pink.  Cardiovascular:  S1, S2 present without murmurs appreciated. Extremities without clubbing or edema. Respiratory:  Clear to auscultation bilaterally. No wheezes, rales, or rhonchi. No distress.  Gastrointestinal:  +BS, soft, and non-distended. Mild epigastric TTP. No HSM noted. No guarding or rebound. No masses appreciated.  Rectal:  Deferred  Musculoskalatal:  Symmetrical without gross deformities. Skin:  Intact without significant lesions or rashes. Neurologic:  Alert and oriented x4;  grossly normal neurologically. Carvical collar in place and not manipulated. Psych:  Alert and cooperative. Normal mood and affect.    03/30/2015 9:23 AM   Disclaimer: This note was dictated with voice recognition software. Similar sounding words can inadvertently be transcribed and may not be corrected upon review.

## 2015-03-25 NOTE — Patient Instructions (Signed)
1. Stop Nexium. 2. Start taking Dexilant 60 mg once a day. We will give the samples for 2 weeks. 3. I sent in a prescription of Zofran to your pharmacy for nausea. You can take 1 every 8 hours as needed for nausea. 4. Call us in 2 weeks and let us know how the Dexilant is working. 5. Return for follow-up in 6 weeks.

## 2015-03-26 ENCOUNTER — Ambulatory Visit (HOSPITAL_COMMUNITY): Payer: Self-pay | Admitting: Psychiatry

## 2015-03-30 ENCOUNTER — Telehealth (HOSPITAL_COMMUNITY): Payer: Self-pay | Admitting: *Deleted

## 2015-03-30 NOTE — Telephone Encounter (Signed)
voice message from patient, said the Wellbutrin 150 mg. is making her extremely sleepy.   She can't function.

## 2015-03-30 NOTE — Assessment & Plan Note (Addendum)
Has irritable bowel syndrome, mixed type. Recommend continue MiraLAX for constipation and Bentyl for diarrhea as needed. Return for follow-up in 6 weeks. She does have hemorrhoid flareups as well and is interested in possible hemorrhoid banding. She states she will bring it up with her cardiologist to discuss whether she can hold her anticoagulant.

## 2015-03-30 NOTE — Assessment & Plan Note (Signed)
Patient with nausea without vomiting, likely due to uncontrolled GERD which seems to have worsened after cervical fusion. We'll send in prescription for Zofran to take as needed in addition to PPI changes noted above. Return for follow-up in 6 weeks. Has a history of gastroparesis but does not want to try Reglan as she had side effects with the last attempt.

## 2015-03-30 NOTE — Assessment & Plan Note (Signed)
Progressive GERD symptoms on Nexium. Stop Nexium, start Dexilant 60 mg daily with samples for 2 weeks. If she continues to have problems may need pH testing. Mentioned a discussion years ago about Nissen fundoplication for hiatal hernia to improve her GERD symptoms. Reluctant to schedule for endoscopy at this time as she just completed cervical fusion surgery and still has a c-collar in place. Return for follow-up in 6 weeks to discuss options in next steps.

## 2015-03-31 NOTE — Progress Notes (Signed)
CC'D TO PCP °

## 2015-04-01 ENCOUNTER — Other Ambulatory Visit (HOSPITAL_COMMUNITY): Payer: Self-pay | Admitting: Psychiatry

## 2015-04-01 ENCOUNTER — Other Ambulatory Visit: Payer: Self-pay

## 2015-04-01 DIAGNOSIS — Z1231 Encounter for screening mammogram for malignant neoplasm of breast: Secondary | ICD-10-CM

## 2015-04-01 DIAGNOSIS — I2510 Atherosclerotic heart disease of native coronary artery without angina pectoris: Secondary | ICD-10-CM | POA: Diagnosis not present

## 2015-04-01 DIAGNOSIS — E119 Type 2 diabetes mellitus without complications: Secondary | ICD-10-CM | POA: Diagnosis not present

## 2015-04-01 DIAGNOSIS — I10 Essential (primary) hypertension: Secondary | ICD-10-CM | POA: Diagnosis not present

## 2015-04-01 MED ORDER — BUPROPION HCL 75 MG PO TABS: 75.0000 mg | ORAL_TABLET | Freq: Every day | ORAL | Status: DC

## 2015-04-01 NOTE — Telephone Encounter (Signed)
Called pt back to get more details. Per pt, she wants to go back on the 75 mg Wellbutrin XL. Per pt, the mg she is on right now is not working. Per pt, it's making her extremely sleepy, can not function like walking and can't stay awake. Pt number is [phone]

## 2015-04-01 NOTE — Telephone Encounter (Signed)
75 mg dose sent in

## 2015-04-05 DIAGNOSIS — N3946 Mixed incontinence: Secondary | ICD-10-CM | POA: Diagnosis not present

## 2015-04-05 DIAGNOSIS — N8111 Cystocele, midline: Secondary | ICD-10-CM | POA: Diagnosis not present

## 2015-04-05 DIAGNOSIS — R3914 Feeling of incomplete bladder emptying: Secondary | ICD-10-CM | POA: Diagnosis not present

## 2015-04-05 DIAGNOSIS — Z0000 Encounter for general adult medical examination without abnormal findings: Secondary | ICD-10-CM | POA: Diagnosis not present

## 2015-04-06 DIAGNOSIS — H538 Other visual disturbances: Secondary | ICD-10-CM | POA: Diagnosis not present

## 2015-04-06 DIAGNOSIS — R42 Dizziness and giddiness: Secondary | ICD-10-CM | POA: Diagnosis not present

## 2015-04-06 DIAGNOSIS — E237 Disorder of pituitary gland, unspecified: Secondary | ICD-10-CM | POA: Diagnosis not present

## 2015-04-06 DIAGNOSIS — R279 Unspecified lack of coordination: Secondary | ICD-10-CM | POA: Diagnosis not present

## 2015-04-06 NOTE — Telephone Encounter (Signed)
noted 

## 2015-04-08 ENCOUNTER — Telehealth: Payer: Self-pay

## 2015-04-08 DIAGNOSIS — K219 Gastro-esophageal reflux disease without esophagitis: Secondary | ICD-10-CM

## 2015-04-08 NOTE — Telephone Encounter (Signed)
Pt called and states that the [place] is working great and wants a Careers information officer

## 2015-04-09 MED ORDER — DEXLANSOPRAZOLE 60 MG PO CPDR: 60.0000 mg | DELAYED_RELEASE_CAPSULE | Freq: Every day | ORAL | Status: DC

## 2015-04-09 NOTE — Telephone Encounter (Signed)
Please notify patient that Rx sent to pharmacy

## 2015-04-12 NOTE — Telephone Encounter (Signed)
Called pt and LMOM about RX

## 2015-04-13 DIAGNOSIS — Z79899 Other long term (current) drug therapy: Secondary | ICD-10-CM | POA: Diagnosis not present

## 2015-04-13 DIAGNOSIS — E1165 Type 2 diabetes mellitus with hyperglycemia: Secondary | ICD-10-CM | POA: Diagnosis not present

## 2015-04-13 DIAGNOSIS — R5383 Other fatigue: Secondary | ICD-10-CM | POA: Diagnosis not present

## 2015-04-14 DIAGNOSIS — R5383 Other fatigue: Secondary | ICD-10-CM | POA: Diagnosis not present

## 2015-04-14 DIAGNOSIS — Z79899 Other long term (current) drug therapy: Secondary | ICD-10-CM | POA: Diagnosis not present

## 2015-04-15 DIAGNOSIS — E1142 Type 2 diabetes mellitus with diabetic polyneuropathy: Secondary | ICD-10-CM | POA: Diagnosis not present

## 2015-04-15 DIAGNOSIS — G894 Chronic pain syndrome: Secondary | ICD-10-CM | POA: Diagnosis not present

## 2015-04-15 DIAGNOSIS — Z79891 Long term (current) use of opiate analgesic: Secondary | ICD-10-CM | POA: Diagnosis not present

## 2015-04-15 DIAGNOSIS — M4806 Spinal stenosis, lumbar region: Secondary | ICD-10-CM | POA: Diagnosis not present

## 2015-04-19 ENCOUNTER — Other Ambulatory Visit: Payer: Self-pay | Admitting: Cardiology

## 2015-04-23 ENCOUNTER — Encounter: Payer: Self-pay | Admitting: Cardiology

## 2015-04-23 ENCOUNTER — Ambulatory Visit (INDEPENDENT_AMBULATORY_CARE_PROVIDER_SITE_OTHER): Payer: Medicare Other | Admitting: Cardiology

## 2015-04-23 VITALS — BP 126/79 | HR 78 | Ht 69.0 in | Wt 307.6 lb

## 2015-04-23 DIAGNOSIS — K649 Unspecified hemorrhoids: Secondary | ICD-10-CM | POA: Diagnosis not present

## 2015-04-23 DIAGNOSIS — I480 Paroxysmal atrial fibrillation: Secondary | ICD-10-CM | POA: Diagnosis not present

## 2015-04-23 DIAGNOSIS — I208 Other forms of angina pectoris: Secondary | ICD-10-CM | POA: Diagnosis not present

## 2015-04-23 DIAGNOSIS — I10 Essential (primary) hypertension: Secondary | ICD-10-CM | POA: Diagnosis not present

## 2015-04-23 NOTE — Patient Instructions (Signed)
Your physician recommends that you continue on your current medications as directed. Please refer to the Current Medication list given to you today. Your physician recommends that you schedule a follow-up appointment in: 6 months. You will receive a reminder letter in the mail in about 4 months reminding you to call and schedule your appointment. If you don't receive this letter, please contact our office. 

## 2015-04-23 NOTE — Progress Notes (Addendum)
Cardiology Office Note  Date: 04/23/2015   ID: [name] [name], DOB [birth year], MRN [id number]  PCP: Glenda Chroman, MD  Primary Cardiologist: Rozann Lesches, MD   Chief Complaint  Patient presents with  . Atrial Fibrillation    History of Present Illness: [name] [name] is a 55 y.o. female last seen in December 2016. She presents for a routine follow-up visit. Reports no palpitations or increasing chest pain symptoms on current regimen. She has had other health problems, continues to wear a neck brace after having cervical spine surgery in November of last year, has trouble with chronic back pain, also IBS as well as hemorrhoid flareups.  I reviewed her medications. Cardiac regimen includes Cardizem CD, flecainide, Lasix, potassium supplements, Imdur, lisinopril, and Xarelto.  I reviewed her follow-up ECG today which shows sinus rhythm with left anterior fascicular block and increased voltage, QRS duration 118 ms.  She was evaluated by gastroenterology recently in March, discussion had at that time about possibly undergoing hemorrhoid banding. Note indicates that she would need to be off Xarelto 2 days prior to the procedure and then offer a week thereafter. I do think that she could proceed with this at an acceptable risk, relatively low risk of stroke particularly since she has been maintaining sinus rhythm.  Past Medical History  Diagnosis Date  . Atrial fibrillation (HCC)     Paroxysmal  . Essential hypertension, benign   . Type 2 diabetes mellitus ([place])   . History of cardiac catheterization     Normal coronary arteries 11/12  . Esophageal spasm     NTG and Norvasc  . Fibromyalgia   . Irritable bowel syndrome   . COPD (chronic obstructive pulmonary disease) ([place])   . Lymphedema   . GERD (gastroesophageal reflux disease)   . DDD (degenerative disc disease)   . Osteoarthritis   . Gastroparesis   . Urinary tract infection   . Depression   . History of pituitary tumor    . Sleep apnea     BIPAP  . Bladder dysfunction     Self urinary catheterization    Past Surgical History  Procedure Laterality Date  . Back surgery    . Cholecystectomy    . Appendectomy    . Abdominal hysterectomy    . Lung biopsy    . Knee surgery    . Esophagogastroduodenoscopy  10/12/2004    [id number] plaquing on the esophageal mucosa of uncertain significance, not typical of what is seen with candida esophagitis status post KOH brushing for KOH prep and biopsy for histology. Rule out candida esophagitis/eosinophilic esophagitis. Otherwise normal esophagus. Tiny hiatal hernia. Otherwise, normal stomach, normal D1 and D2. Benign biopsy of esophagus, unknown KOH status.   . Colonoscopy      Approximately 2003. Per medical records, internal hemorrhoids noted  . Colonoscopy N/A 06/25/2013    Dr. Gala Romney: Anal canal hemorrhoids-more likely the source of paper hematochezia. Redundant, capacious colon. Multiple colonic polyps-tubular adenoma  . Esophagogastroduodenoscopy N/A 06/25/2013    Dr. Rourk:mild chronic gastritis  . Left heart catheterization with coronary angiogram N/A 11/15/2010    Procedure: LEFT HEART CATHETERIZATION WITH CORONARY ANGIOGRAM;  Surgeon: Laverda Page, MD;  Location: [facility] CATH LAB;  Service: Cardiovascular;  Laterality: N/A;  . Cardioversion N/A 03/26/2014    Procedure: CARDIOVERSION;  Surgeon: Herminio Commons, MD;  Location: [facility] ORS;  Service: Endoscopy;  Laterality: N/A;  . Cardioversion N/A 05/04/2014    Procedure: CARDIOVERSION;  Surgeon: Aloha Gell  Domenic Polite, MD;  Location: [facility] ORS;  Service: Cardiovascular;  Laterality: N/A;  . Cardiac catheterization    . Anterior cervical decomp/discectomy fusion N/A 12/02/2014    Procedure: Cervical five cervical six anterior cervical decompression with fusion interbody prosthesis plating and bone graft;  Surgeon: Newman Pies, MD;  Location: [facility] NEURO ORS;  Service: Neurosurgery;  Laterality: N/A;  C56 anterior cervical  decompression with fusion interbody prosthesis plating and bonegraft    Current Outpatient Prescriptions  Medication Sig Dispense Refill  . albuterol (PROAIR HFA) 108 (90 BASE) MCG/ACT inhaler Inhale 2 puffs into the lungs every 6 (six) hours as needed for wheezing or shortness of breath.     Marland Kitchen albuterol (PROVENTIL) (2.5 MG/3ML) 0.083% nebulizer solution Take 2.5 mg by nebulization every 6 (six) hours as needed for wheezing. For shortness of breath.    Marland Kitchen atorvastatin (LIPITOR) 10 MG tablet Take 10 mg by mouth at bedtime.  3  . baclofen (LIORESAL) 20 MG tablet Take 20 mg by mouth 3 (three) times daily.      Marland Kitchen buPROPion (WELLBUTRIN) 75 MG tablet Take 1 tablet (75 mg total) by mouth daily. 30 tablet 2  . dexlansoprazole (DEXILANT) 60 MG capsule Take 1 capsule (60 mg total) by mouth daily. 90 capsule 3  . diazepam (VALIUM) 5 MG tablet Take 5 mg by mouth every 12 (twelve) hours as needed for anxiety. For anxiety.    . diclofenac sodium (VOLTAREN) 1 % GEL Apply 2-4 g topically 4 (four) times daily as needed. For pain    . dicyclomine (BENTYL) 10 MG capsule Take 1 capsule (10 mg total) by mouth 4 (four) times daily -  before meals and at bedtime. For abdominal cramping and loose stools (Patient taking differently: Take 10 mg by mouth 4 (four) times daily as needed. For abdominal cramping and loose stools) 120 capsule 5  . diltiazem (CARDIZEM CD) 120 MG 24 hr capsule TAKE 1 CAPSULE BY MOUTH EVERY DAY 90 capsule 3  . esomeprazole (NEXIUM) 40 MG capsule Take 40 mg by mouth daily before breakfast.    . flecainide (TAMBOCOR) 50 MG tablet TAKE 2 TABLETS BY MOUTH TWICE DAILY 120 tablet 6  . furosemide (LASIX) 80 MG tablet Take 80 mg by mouth 2 (two) times daily.      Marland Kitchen gabapentin (NEURONTIN) 600 MG tablet Take 600 mg by mouth 4 (four) times daily.    . hydrocortisone (PROCTOSOL HC) 2.5 % rectal cream Place 1 application rectally 2 (two) times daily. (Patient taking differently: Place 1 application rectally  daily as needed for hemorrhoids. ) 30 g 0  . isosorbide mononitrate (IMDUR) 120 MG 24 hr tablet Take 120 mg by mouth every morning.     . isosorbide mononitrate (IMDUR) 30 MG 24 hr tablet Take 30 mg by mouth every evening.     Marland Kitchen lisinopril (PRINIVIL,ZESTRIL) 2.5 MG tablet Take 2.5 mg by mouth at bedtime.     Marland Kitchen loratadine (CLARITIN) 10 MG tablet Take 10 mg by mouth daily.      . meclizine (ANTIVERT) 25 MG tablet Take 25 mg by mouth 3 (three) times daily.     . metFORMIN (GLUCOPHAGE) 500 MG tablet Take 500 mg by mouth daily.    . nitroGLYCERIN (NITROLINGUAL) 0.4 MG/SPRAY spray Place 1 spray under the tongue as needed. Chest pain 12 g 3  . NON FORMULARY BIPAP - STARTED ABOUT 1 1/2 years AGO.    Marland Kitchen nystatin (MYCOSTATIN/NYSTOP) 100000 UNIT/GM POWD Apply 1 g topically daily.     Marland Kitchen  ondansetron (ZOFRAN) 4 MG tablet Take 1 tablet (4 mg total) by mouth every 8 (eight) hours as needed for nausea or vomiting. 30 tablet 1  . [place] ER, CRUSH RESISTANT, 20 MG T12A Take 1 tablet by mouth 2 (two) times daily.    Marland Kitchen oxyCODONE-acetaminophen (PERCOCET) 10-325 MG per tablet Take 1 tablet by mouth every 6 (six) hours as needed. For pain.    . polyethylene glycol powder (GLYCOLAX/MIRALAX) powder Take 17 g by mouth daily as needed for mild constipation.     . potassium chloride SA (K-DUR,KLOR-CON) 20 MEQ tablet Take 40 mEq by mouth 4 (four) times daily.      . ranitidine (ZANTAC) 150 MG tablet Take 150 mg by mouth as needed.     . Rivaroxaban (XARELTO) 20 MG TABS Take 20 mg by mouth daily.    Marland Kitchen spironolactone (ALDACTONE) 50 MG tablet Take 50 mg by mouth 3 (three) times daily.       No current facility-administered medications for this visit.   Allergies:  Levofloxacin; Hyoscyamine sulfate; and Reglan   Social History: The patient  reports that she quit smoking about 8 years ago. Her smoking use included Cigarettes. She started smoking about 38 years ago. She has a 45 pack-year smoking history. She has never used  smokeless tobacco. She reports that she does not drink alcohol or use illicit drugs.   ROS:  Please see the history of present illness. Otherwise, complete review of systems is positive for chronic back pain.  All other systems are reviewed and negative.   Physical Exam: VS:  BP 126/79 mmHg  Pulse 78  Ht 5\' 9"  (1.753 m)  Wt 307 lb 9.6 oz (139.526 kg)  BMI 45.40 kg/m2  SpO2 96%, BMI Body mass index is 45.4 kg/(m^2).  Wt Readings from Last 3 Encounters:  04/23/15 307 lb 9.6 oz (139.526 kg)  03/25/15 303 lb 6.4 oz (137.621 kg)  03/17/15 306 lb 12.8 oz (139.164 kg)    Obese woman in no acute distress. Neck brace in place. HEENT: Conjunctiva and lids normal, oropharynx clear.  Neck: No elevated JVP or carotid bruits.  Lungs: Clear to auscultation, nonlabored breathing at rest.  Cardiac: RRR, no S3 or significant systolic murmur, no pericardial rub.  Abdomen: Soft, nontender, bowel sounds present.  Extremities: Mild edema, distal pulses 2+. Uses a cane to walk.  ECG: I personally reviewed the prior tracing from 12/01/2014 which showed normal sinus rhythm with left anterior fascicular block.  Recent Labwork: 11/23/2014: BUN 9; Creatinine, Ser 0.58; Hemoglobin 12.9; Platelets 297; Potassium 4.0; Sodium 134*   Assessment and Plan:  1. Paroxysmal atrial fibrillation, maintaining sinus rhythm on current regimen including Cardizem CD, flecainide, and Xarelto. ECG reviewed. No changes made to current regimen.  2. History of recurrent chest pain with previously documented normal coronary arteries and low risk follow-up noninvasive cardiac evaluation. She continues on Imdur for possible endothelial dysfunction. No recently worsening symptoms.  3. Essential hypertension, blood pressure is adequately controlled today.  4. Patient considering possible hemorrhoid banding procedure with Dr. Gala Romney. She would need to be off Xarelto 48 hours prior to the procedure and then one week thereafter  according to recent note. I think that she could proceed with this at relatively low risk of stroke, particular since she has been maintaining sinus rhythm.  Current medicines were reviewed with the patient today.   Orders Placed This Encounter  Procedures  . EKG 12-Lead    Disposition: FU with me in 6  months.   Signed, Satira Sark, MD, [facility] 04/23/2015 2:13 PM    [facility] at [place], [place], [place] [address] Phone: [phone]; Fax: [phone]

## 2015-04-28 ENCOUNTER — Ambulatory Visit (HOSPITAL_COMMUNITY): Payer: Self-pay | Admitting: Psychiatry

## 2015-05-13 DIAGNOSIS — Z79891 Long term (current) use of opiate analgesic: Secondary | ICD-10-CM | POA: Diagnosis not present

## 2015-05-13 DIAGNOSIS — M4806 Spinal stenosis, lumbar region: Secondary | ICD-10-CM | POA: Diagnosis not present

## 2015-05-13 DIAGNOSIS — G894 Chronic pain syndrome: Secondary | ICD-10-CM | POA: Diagnosis not present

## 2015-05-13 DIAGNOSIS — E1142 Type 2 diabetes mellitus with diabetic polyneuropathy: Secondary | ICD-10-CM | POA: Diagnosis not present

## 2015-05-14 ENCOUNTER — Encounter: Payer: Self-pay | Admitting: Internal Medicine

## 2015-05-14 ENCOUNTER — Ambulatory Visit (INDEPENDENT_AMBULATORY_CARE_PROVIDER_SITE_OTHER): Payer: Medicare Other | Admitting: Internal Medicine

## 2015-05-14 VITALS — BP 132/64 | HR 83 | Temp 98.2°F | Ht 69.0 in | Wt 306.4 lb

## 2015-05-14 DIAGNOSIS — K5903 Drug induced constipation: Secondary | ICD-10-CM | POA: Diagnosis not present

## 2015-05-14 DIAGNOSIS — K219 Gastro-esophageal reflux disease without esophagitis: Secondary | ICD-10-CM | POA: Diagnosis not present

## 2015-05-14 DIAGNOSIS — T402X5A Adverse effect of other opioids, initial encounter: Secondary | ICD-10-CM

## 2015-05-14 DIAGNOSIS — I208 Other forms of angina pectoris: Secondary | ICD-10-CM

## 2015-05-14 NOTE — Progress Notes (Signed)
Primary Care Physician:  Glenda Chroman, MD Primary Gastroenterologist:  Dr. Gala Romney  Pre-Procedure History & Physical: HPI:  [name] [name] is a 55 y.o. female here for follow-up of GERD , hematochezia constipation. No multiple medications including multiple opioids. Takes MiraLax at bedtime. Urologist ask her to start a stool softener recently. May go up to a week without a bowel movement. Has intermittent paper hematochezia felt to be due to hemorrhoids. She is interested hemorrhoid banding. She is on [name]. Cardiology has cleared her to come off of it for a week if procedure needed. And  Recent issues with cervical spine;   she is wearing a c-collar. Mobility continues to be impaired which is contributing to constipation.   History of mixed IBS-D  - lately, bowel function shifted towards refractory constipation.  In addition,  she tells me the addition of Dexilant to her regimen has been very successful in combating reflux symptoms in a setting of known gastroparesis.  Past Medical History  Diagnosis Date  . Atrial fibrillation (HCC)     Paroxysmal  . Essential hypertension, benign   . Type 2 diabetes mellitus ([place])   . History of cardiac catheterization     Normal coronary arteries 11/12  . Esophageal spasm     NTG and Norvasc  . Fibromyalgia   . Irritable bowel syndrome   . COPD (chronic obstructive pulmonary disease) ([place])   . Lymphedema   . GERD (gastroesophageal reflux disease)   . DDD (degenerative disc disease)   . Osteoarthritis   . Gastroparesis   . Urinary tract infection   . Depression   . History of pituitary tumor   . Sleep apnea     BIPAP  . Bladder dysfunction     Self urinary catheterization    Past Surgical History  Procedure Laterality Date  . Back surgery    . Cholecystectomy    . Appendectomy    . Abdominal hysterectomy    . Lung biopsy    . Knee surgery    . Esophagogastroduodenoscopy  10/12/2004    [id number] plaquing on  the esophageal mucosa of uncertain significance, not typical of what is seen with candida esophagitis status post KOH brushing for KOH prep and biopsy for histology. Rule out candida esophagitis/eosinophilic esophagitis. Otherwise normal esophagus. Tiny hiatal hernia. Otherwise, normal stomach, normal D1 and D2. Benign biopsy of esophagus, unknown KOH status.   . Colonoscopy      Approximately 2003. Per medical records, internal hemorrhoids noted  . Colonoscopy N/A 06/25/2013    Dr. Gala Romney: Anal canal hemorrhoids-more likely the source of paper hematochezia. Redundant, capacious colon. Multiple colonic polyps-tubular adenoma  . Esophagogastroduodenoscopy N/A 06/25/2013    Dr. Rourk:mild chronic gastritis  . Left heart catheterization with coronary angiogram N/A 11/15/2010    Procedure: LEFT HEART CATHETERIZATION WITH CORONARY ANGIOGRAM;  Surgeon: Laverda Page, MD;  Location: [facility] CATH LAB;  Service: Cardiovascular;  Laterality: N/A;  . Cardioversion N/A 03/26/2014    Procedure: CARDIOVERSION;  Surgeon: Herminio Commons, MD;  Location: [facility] ORS;  Service: Endoscopy;  Laterality: N/A;  . Cardioversion N/A 05/04/2014    Procedure: CARDIOVERSION;  Surgeon: Satira Sark, MD;  Location: [facility] ORS;  Service: Cardiovascular;  Laterality: N/A;  . Cardiac catheterization    . Anterior cervical decomp/discectomy fusion N/A 12/02/2014    Procedure: Cervical five cervical six anterior cervical decompression with fusion interbody prosthesis plating and bone graft;  Surgeon: Newman Pies, MD;  Location: [facility] NEURO ORS;  Service: Neurosurgery;  Laterality: N/A;  C56 anterior cervical decompression with fusion interbody prosthesis plating and bonegraft    Prior to Admission medications   Medication Sig Start Date End Date Taking? Authorizing Provider  albuterol (PROAIR HFA) 108 (90 BASE) MCG/ACT inhaler Inhale 2 puffs into the lungs every 6 (six) hours as needed for wheezing or shortness of breath.    Yes  Historical Provider, MD  albuterol (PROVENTIL) (2.5 MG/3ML) 0.083% nebulizer solution Take 2.5 mg by nebulization every 6 (six) hours as needed for wheezing. For shortness of breath.   Yes Historical Provider, MD  atorvastatin (LIPITOR) 10 MG tablet Take 10 mg by mouth at bedtime. 03/12/15  Yes Historical Provider, MD  baclofen (LIORESAL) 20 MG tablet Take 20 mg by mouth 3 (three) times daily.     Yes Historical Provider, MD  buPROPion (WELLBUTRIN) 75 MG tablet Take 1 tablet (75 mg total) by mouth daily. 04/01/15 03/31/16 Yes Cloria Spring, MD  dexlansoprazole (DEXILANT) 60 MG capsule Take 1 capsule (60 mg total) by mouth daily. 04/09/15  Yes Carlis Stable, NP  diazepam (VALIUM) 5 MG tablet Take 5 mg by mouth every 12 (twelve) hours as needed for anxiety. For anxiety.   Yes Historical Provider, MD  diclofenac sodium (VOLTAREN) 1 % GEL Apply 2-4 g topically 4 (four) times daily as needed. For pain   Yes Historical Provider, MD  dicyclomine (BENTYL) 10 MG capsule Take 1 capsule (10 mg total) by mouth 4 (four) times daily -  before meals and at bedtime. For abdominal cramping and loose stools Patient taking differently: Take 10 mg by mouth 4 (four) times daily as needed. For abdominal cramping and loose stools 06/05/13  Yes Orvil Feil, NP  diltiazem (CARDIZEM CD) 120 MG 24 hr capsule TAKE 1 CAPSULE BY MOUTH EVERY DAY 04/19/15  Yes Satira Sark, MD  esomeprazole (NEXIUM) 40 MG capsule Take 40 mg by mouth daily before breakfast.   Yes Historical Provider, MD  flecainide (TAMBOCOR) 50 MG tablet TAKE 2 TABLETS BY MOUTH TWICE DAILY 12/29/14  Yes Satira Sark, MD  furosemide (LASIX) 80 MG tablet Take 80 mg by mouth 2 (two) times daily.     Yes Historical Provider, MD  gabapentin (NEURONTIN) 600 MG tablet Take 600 mg by mouth 4 (four) times daily.   Yes Historical Provider, MD  hydrocortisone (PROCTOSOL HC) 2.5 % rectal cream Place 1 application rectally 2 (two) times daily. Patient taking differently: Place  1 application rectally daily as needed for hemorrhoids.  08/20/13  Yes Orvil Feil, NP  isosorbide mononitrate (IMDUR) 120 MG 24 hr tablet Take 120 mg by mouth every morning.    Yes Historical Provider, MD  isosorbide mononitrate (IMDUR) 30 MG 24 hr tablet Take 30 mg by mouth every evening.    Yes Historical Provider, MD  lisinopril (PRINIVIL,ZESTRIL) 2.5 MG tablet Take 2.5 mg by mouth at bedtime.  10/29/12  Yes Historical Provider, MD  loratadine (CLARITIN) 10 MG tablet Take 10 mg by mouth daily.     Yes Historical Provider, MD  meclizine (ANTIVERT) 25 MG tablet Take 25 mg by mouth 3 (three) times daily.  07/31/13  Yes Historical Provider, MD  metFORMIN (GLUCOPHAGE) 500 MG tablet Take 500 mg by mouth daily. 05/18/13  Yes Historical Provider, MD  nitroGLYCERIN (NITROLINGUAL) 0.4 MG/SPRAY spray Place 1 spray under the tongue as needed. Chest pain 07/08/14  Yes Satira Sark, MD  NON FORMULARY BIPAP - STARTED ABOUT 1 1/2 years AGO.   Yes Historical Provider, MD  nystatin (MYCOSTATIN/NYSTOP) 100000 UNIT/GM POWD Apply 1 g topically daily.    Yes Historical Provider, MD  ondansetron (ZOFRAN) 4 MG tablet Take 1 tablet (4 mg total) by mouth every 8 (eight) hours as needed for nausea or vomiting. 03/25/15  Yes Carlis Stable, NP  OPANA ER, CRUSH RESISTANT, 20 MG T12A Take 1 tablet by mouth 2 (two) times daily. 04/10/12  Yes Historical Provider, MD  oxyCODONE-acetaminophen (PERCOCET) 10-325 MG per tablet Take 1 tablet by mouth every 6 (six) hours as needed. For pain.   Yes Historical Provider, MD  polyethylene glycol powder (GLYCOLAX/MIRALAX) powder Take 17 g by mouth daily as needed for mild constipation.  08/25/13  Yes Historical Provider, MD  potassium chloride SA (K-DUR,KLOR-CON) 20 MEQ tablet Take 40 mEq by mouth 4 (four) times daily.     Yes Historical Provider, MD  ranitidine (ZANTAC) 150 MG tablet Take 150 mg by mouth as needed.    Yes Historical Provider, MD  Rivaroxaban (XARELTO) 20 MG TABS Take 20 mg by  mouth daily.   Yes Historical Provider, MD  spironolactone (ALDACTONE) 50 MG tablet Take 50 mg by mouth 3 (three) times daily.     Yes Historical Provider, MD    Allergies as of 05/14/2015 - Review Complete 05/14/2015  Allergen Reaction Noted  . Levofloxacin Other (See Comments) 12/01/2014  . Hyoscyamine sulfate Other (See Comments)   . Reglan [metoclopramide] Other (See Comments) 11/05/2014    Family History  Problem Relation Age of Onset  . Coronary artery disease Father     Died age 64  . Heart attack Father   . Arrhythmia Father     AF  . Diabetes Father   . Parkinson's disease Father   . Arrhythmia Mother     AF  . Stroke Mother   . Dementia Mother   . Cancer Mother     UTERINE   . Parkinson's disease Mother   . Arrhythmia Brother     AF  . Colon cancer Maternal Grandfather   . Colon cancer Paternal Aunt   . Colon cancer Paternal Aunt   . Diabetes Son   . Cancer Sister     BREAST   . Depression Sister   . Depression Sister   . Depression Sister     Social History   Social History  . Marital Status: Divorced    Spouse Name: N/A  . Number of Children: N/A  . Years of Education: N/A   Occupational History  . Not on file.   Social History Main Topics  . Smoking status: Former Smoker -- 1.50 packs/day for 30 years    Types: Cigarettes    Start date: 03/06/1977    Quit date: 01/03/2007  . Smokeless tobacco: Never Used     Comment: Quit x 6 years  . Alcohol Use: No  . Drug Use: No  . Sexual Activity: No   Other Topics Concern  . Not on file   Social History Narrative    Review of Systems: See HPI, otherwise negative ROS  Physical Exam: BP 132/64 mmHg  Pulse 83  Temp(Src) 98.2 F (36.8 C) (Oral)  Ht 5\' 9"  (1.753 m)  Wt 306 lb 6.4 oz (138.982 kg)  BMI 45.23 kg/m2 General:   Alert,   pleasant and cooperative in NAD.  C-collar in place Skin:  Intact without significant lesions or rashes. Neck:  Supple;  no masses or thyromegaly. No significant  cervical adenopathy. Lungs:  Clear throughout to auscultation.   No wheezes, crackles, or rhonchi. No acute distress. Heart:  Regular rate and rhythm; no murmurs, clicks, rubs,  or gallops. Abdomen: Obese. Positive bowel sounds no succussion splash abdomen soft and nontender no obvious mass or organomegaly Extremities:  Without clubbing or edema.  Impression:   Pleasant 55 year old lady with multiple medical problems including long-standing diabetes, gastroparesis, GERD on chronic opioid therapy;  history of IBS. Now pretty much has refractory constipation - likely opioid induced to a large extent although her diabetes, immobility and other medications (i.e. Cardieziem may be contributing as well. She has paper hematochezia in all likelihood related to hemorrhoids given recent colonoscopy. She's interested in hemorrhoid banding. At this time, it is most important to effectively manae her constipation before pursuing hemorrhoid banding.    Recommendations:  Add colace twice daily to regimen  Continue miralax for now  Begin Linzess 145 daily x 7 days - samples  Call us middle of next week with a progress report.  We can consider a course of Movantik if Linzess not effective  Continue Dexilant 60 mg daily  Office visit in 3 months   Notice: This dictation was prepared with Dragon dictation along with smaller phrase technology. Any transcriptional errors that result from this process are unintentional and may not be corrected upon review.

## 2015-05-14 NOTE — Patient Instructions (Signed)
Add colace twice daily to regimen  Continue miralax for now  Begin Linzess 145 daily x 7 days - samples  Call us middle of next week with a progress report  Continue Dexilant 60 mg daily  Office visit in 3 months

## 2015-05-19 ENCOUNTER — Telehealth: Payer: Self-pay | Admitting: Internal Medicine

## 2015-05-19 NOTE — Telephone Encounter (Signed)
Lets increase Linzess to 290 daily x 10 days and see how she does  - and we will go from there

## 2015-05-19 NOTE — Telephone Encounter (Signed)
Dr.Rourk, pt called- linzess 145mg  is not helping. Did you want to try her on movantik? We have 1 box of samples left that has 3 pills in it.

## 2015-05-19 NOTE — Telephone Encounter (Signed)
Pt is aware. Samples of linzess 285mcg are at the front desk.

## 2015-05-19 NOTE — Telephone Encounter (Signed)
Pt called to say the the Linzess samples were not helping her and she needed something else or maybe increase the dose of the linzess. She uses [name]. [id number]

## 2015-05-26 ENCOUNTER — Telehealth: Payer: Self-pay | Admitting: Internal Medicine

## 2015-05-26 NOTE — Telephone Encounter (Signed)
Pt called this afternoon to say that the Linzess isn't helping her have a complete bowel movement and she has 2 more days worth of Linzess to take and didn't know what RMR would recommended after the 2 days are up. She said she is also talking 2 colace stool softeners and drinking miralax twice a day along with the Linzess and can not have a complete BM. Please advise and call her back at [phone]

## 2015-05-27 DIAGNOSIS — Z87891 Personal history of nicotine dependence: Secondary | ICD-10-CM | POA: Diagnosis not present

## 2015-05-27 DIAGNOSIS — E1165 Type 2 diabetes mellitus with hyperglycemia: Secondary | ICD-10-CM | POA: Diagnosis not present

## 2015-05-27 NOTE — Telephone Encounter (Signed)
Progress noted. Can try Movantik 25 mg daily - samples 2 weeks please. May supplement with MiraLAX if needed. Stop Linzess.

## 2015-05-27 NOTE — Telephone Encounter (Signed)
Dr.Rourk, pt is taking linzess 290 mcg. Do you want to try her on something else?

## 2015-05-27 NOTE — Telephone Encounter (Signed)
Pt is aware. We are out of samples at the moment. Can we send in an rx?

## 2015-05-28 DIAGNOSIS — E119 Type 2 diabetes mellitus without complications: Secondary | ICD-10-CM | POA: Diagnosis not present

## 2015-05-28 DIAGNOSIS — I2510 Atherosclerotic heart disease of native coronary artery without angina pectoris: Secondary | ICD-10-CM | POA: Diagnosis not present

## 2015-05-28 DIAGNOSIS — I10 Essential (primary) hypertension: Secondary | ICD-10-CM | POA: Diagnosis not present

## 2015-05-29 NOTE — Telephone Encounter (Signed)
Callin movantik 25 mg daily #30 - not refills

## 2015-06-01 ENCOUNTER — Encounter (HOSPITAL_COMMUNITY): Payer: Self-pay | Admitting: Psychiatry

## 2015-06-01 ENCOUNTER — Ambulatory Visit (INDEPENDENT_AMBULATORY_CARE_PROVIDER_SITE_OTHER): Payer: Medicare Other | Admitting: Psychiatry

## 2015-06-01 VITALS — BP 135/74 | HR 84 | Ht 69.0 in | Wt 305.0 lb

## 2015-06-01 DIAGNOSIS — I208 Other forms of angina pectoris: Secondary | ICD-10-CM

## 2015-06-01 DIAGNOSIS — F32A Depression, unspecified: Secondary | ICD-10-CM

## 2015-06-01 DIAGNOSIS — F329 Major depressive disorder, single episode, unspecified: Secondary | ICD-10-CM | POA: Diagnosis not present

## 2015-06-01 MED ORDER — BUPROPION HCL 75 MG PO TABS: 75.0000 mg | ORAL_TABLET | Freq: Every day | ORAL | Status: DC

## 2015-06-01 MED ORDER — NALOXEGOL OXALATE 25 MG PO TABS: 25.0000 mg | ORAL_TABLET | Freq: Every day | ORAL | Status: DC

## 2015-06-01 NOTE — Telephone Encounter (Signed)
rx sent to the pharmacy. 

## 2015-06-01 NOTE — Addendum Note (Signed)
Addended by: Claudina Lick on: 06/01/2015 08:11 AM   Modules accepted: Orders
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
## 2015-06-08 DIAGNOSIS — E7800 Pure hypercholesterolemia, unspecified: Secondary | ICD-10-CM | POA: Diagnosis not present

## 2015-06-08 DIAGNOSIS — Z1389 Encounter for screening for other disorder: Secondary | ICD-10-CM | POA: Diagnosis not present

## 2015-06-08 DIAGNOSIS — R5383 Other fatigue: Secondary | ICD-10-CM | POA: Diagnosis not present

## 2015-06-08 DIAGNOSIS — Z1211 Encounter for screening for malignant neoplasm of colon: Secondary | ICD-10-CM | POA: Diagnosis not present

## 2015-06-08 DIAGNOSIS — Z299 Encounter for prophylactic measures, unspecified: Secondary | ICD-10-CM | POA: Diagnosis not present

## 2015-06-08 DIAGNOSIS — Z6841 Body Mass Index (BMI) 40.0 and over, adult: Secondary | ICD-10-CM | POA: Diagnosis not present

## 2015-06-08 DIAGNOSIS — Z0000 Encounter for general adult medical examination without abnormal findings: Secondary | ICD-10-CM | POA: Diagnosis not present

## 2015-06-08 DIAGNOSIS — Z79899 Other long term (current) drug therapy: Secondary | ICD-10-CM | POA: Diagnosis not present

## 2015-06-08 DIAGNOSIS — Z7189 Other specified counseling: Secondary | ICD-10-CM | POA: Diagnosis not present

## 2015-06-08 DIAGNOSIS — E1165 Type 2 diabetes mellitus with hyperglycemia: Secondary | ICD-10-CM | POA: Diagnosis not present

## 2015-06-10 DIAGNOSIS — E1142 Type 2 diabetes mellitus with diabetic polyneuropathy: Secondary | ICD-10-CM | POA: Diagnosis not present

## 2015-06-10 DIAGNOSIS — M4806 Spinal stenosis, lumbar region: Secondary | ICD-10-CM | POA: Diagnosis not present

## 2015-06-10 DIAGNOSIS — Z79891 Long term (current) use of opiate analgesic: Secondary | ICD-10-CM | POA: Diagnosis not present

## 2015-06-10 DIAGNOSIS — G894 Chronic pain syndrome: Secondary | ICD-10-CM | POA: Diagnosis not present

## 2015-06-18 ENCOUNTER — Other Ambulatory Visit: Payer: Self-pay | Admitting: Internal Medicine

## 2015-06-18 ENCOUNTER — Other Ambulatory Visit: Payer: Self-pay | Admitting: Cardiology

## 2015-06-18 ENCOUNTER — Telehealth: Payer: Self-pay

## 2015-06-18 NOTE — Telephone Encounter (Signed)
Would stop the Movantik and go back to [employer]

## 2015-06-18 NOTE — Telephone Encounter (Signed)
Pt called- she has been taking movantik and 2 stool softeners and miralax 1-2 x a day. Pt said it is still not working well and she feels like the linzess worked better.

## 2015-06-18 NOTE — Telephone Encounter (Signed)
Pt only had samples of linzess 285mcg. She would like an rx. Is it ok to send in rx?

## 2015-06-21 NOTE — Telephone Encounter (Signed)
Pharmacy called wanting to know if they could have an rx for linzess 290. I spoke with LSL and she ok'd linzess 245mcg #30 with 11rf. This has been given to the pharmacist at [place].

## 2015-06-21 NOTE — Telephone Encounter (Signed)
agree

## 2015-06-24 DIAGNOSIS — L84 Corns and callosities: Secondary | ICD-10-CM | POA: Diagnosis not present

## 2015-06-24 DIAGNOSIS — E1142 Type 2 diabetes mellitus with diabetic polyneuropathy: Secondary | ICD-10-CM | POA: Diagnosis not present

## 2015-06-24 DIAGNOSIS — B351 Tinea unguium: Secondary | ICD-10-CM | POA: Diagnosis not present

## 2015-07-09 DIAGNOSIS — M4806 Spinal stenosis, lumbar region: Secondary | ICD-10-CM | POA: Diagnosis not present

## 2015-07-09 DIAGNOSIS — Z79891 Long term (current) use of opiate analgesic: Secondary | ICD-10-CM | POA: Diagnosis not present

## 2015-07-09 DIAGNOSIS — E1142 Type 2 diabetes mellitus with diabetic polyneuropathy: Secondary | ICD-10-CM | POA: Diagnosis not present

## 2015-07-09 DIAGNOSIS — G894 Chronic pain syndrome: Secondary | ICD-10-CM | POA: Diagnosis not present

## 2015-07-12 ENCOUNTER — Other Ambulatory Visit: Payer: Self-pay | Admitting: Cardiology

## 2015-07-27 DIAGNOSIS — Z713 Dietary counseling and surveillance: Secondary | ICD-10-CM | POA: Diagnosis not present

## 2015-07-27 DIAGNOSIS — Z6841 Body Mass Index (BMI) 40.0 and over, adult: Secondary | ICD-10-CM | POA: Diagnosis not present

## 2015-07-27 DIAGNOSIS — J42 Unspecified chronic bronchitis: Secondary | ICD-10-CM | POA: Diagnosis not present

## 2015-07-27 DIAGNOSIS — E1165 Type 2 diabetes mellitus with hyperglycemia: Secondary | ICD-10-CM | POA: Diagnosis not present

## 2015-08-06 DIAGNOSIS — Z981 Arthrodesis status: Secondary | ICD-10-CM | POA: Diagnosis not present

## 2015-08-06 DIAGNOSIS — M542 Cervicalgia: Secondary | ICD-10-CM | POA: Diagnosis not present

## 2015-08-10 DIAGNOSIS — G894 Chronic pain syndrome: Secondary | ICD-10-CM | POA: Diagnosis not present

## 2015-08-10 DIAGNOSIS — M4806 Spinal stenosis, lumbar region: Secondary | ICD-10-CM | POA: Diagnosis not present

## 2015-08-10 DIAGNOSIS — Z79891 Long term (current) use of opiate analgesic: Secondary | ICD-10-CM | POA: Diagnosis not present

## 2015-08-10 DIAGNOSIS — E1142 Type 2 diabetes mellitus with diabetic polyneuropathy: Secondary | ICD-10-CM | POA: Diagnosis not present

## 2015-08-16 ENCOUNTER — Encounter: Payer: Self-pay | Admitting: Nurse Practitioner

## 2015-08-16 ENCOUNTER — Ambulatory Visit (INDEPENDENT_AMBULATORY_CARE_PROVIDER_SITE_OTHER): Payer: Medicare Other | Admitting: Nurse Practitioner

## 2015-08-16 ENCOUNTER — Ambulatory Visit (HOSPITAL_COMMUNITY)
Admission: RE | Admit: 2015-08-16 | Discharge: 2015-08-16 | Disposition: A | Discharge status: Home / Self Care | Payer: Medicare Other | Source: Ambulatory Visit | Attending: Nurse Practitioner | Admitting: Nurse Practitioner

## 2015-08-16 VITALS — BP 125/78 | HR 76 | Temp 96.9°F | Ht 69.0 in | Wt 307.0 lb

## 2015-08-16 DIAGNOSIS — K3184 Gastroparesis: Secondary | ICD-10-CM

## 2015-08-16 DIAGNOSIS — R197 Diarrhea, unspecified: Secondary | ICD-10-CM | POA: Diagnosis not present

## 2015-08-16 DIAGNOSIS — K219 Gastro-esophageal reflux disease without esophagitis: Secondary | ICD-10-CM | POA: Diagnosis not present

## 2015-08-16 DIAGNOSIS — E1143 Type 2 diabetes mellitus with diabetic autonomic (poly)neuropathy: Secondary | ICD-10-CM

## 2015-08-16 DIAGNOSIS — K5900 Constipation, unspecified: Secondary | ICD-10-CM | POA: Insufficient documentation

## 2015-08-16 DIAGNOSIS — I208 Other forms of angina pectoris: Secondary | ICD-10-CM | POA: Diagnosis not present

## 2015-08-16 MED ORDER — ESOMEPRAZOLE MAGNESIUM 40 MG PO PACK
40.0000 mg | PACK | Freq: Every day | ORAL | 12 refills | Status: DC
Start: 2015-08-16 — End: 2015-09-23

## 2015-08-16 NOTE — Progress Notes (Signed)
cc'ed to pcp °

## 2015-08-16 NOTE — Assessment & Plan Note (Signed)
Patient with refractory constipation. Moban take did not work well, she is back on Linzess 290 g daily. She also takes stool softeners and MiraLAX. When she does not have a bowel movement for more than 2 or 3 days she will mix MiraLAX with warm prune juice. I have advised her that we have essentially exhausted her routine constipation management options at this point. Being prune juice/MiraLAX tends to work, I have advised her to do this more regularly. She can start every other day, decreased to every third day, or increased every day as needed and based on those response. Return for follow-up in 2 months.

## 2015-08-16 NOTE — Progress Notes (Signed)
Referring Provider: Glenda Chroman, MD Primary Care Physician:  Glenda Chroman, MD Primary GI:  Dr. Gala Romney  Chief Complaint  Patient presents with  . Follow-up    GERD, Gastroparesis worse    HPI:   [name] [name] is a 55 y.o. female who presents for follow-up. She was last seen in our office 05/14/2015 for GERD and constipation. At that time she was noted to have persistent refractory constipation. Recommended add Colace twice daily to her regimen, continue MiraLAX, start Linzess 145 g with samples provided for 7 days, continue Dexilant, calls the progress report and consider Moban take if Linzess not effective, return for follow-up in 3 months. She called one week later stating Linzess 145 was not helping and her Linzess is increased to 290 g daily. A week after that she called to say Linzess 290 g not working. She was started on Movantik 25 mg daily and instructed to add MiraLAX as needed. No further progress report calls.  Today she states she stopped taking Movantik and switched back to Linzess and still taking MiraLAX and prune juice after 3 days without bowel movement and will sometimes have a bowel movement at that point. States Dexilant not working as well as Nexium and wants to go back on Nexium. Still having daily heartburn symptoms. Also with nausea that she thinks is related to gastroparesis. Last GES in 2009 abnormal. Doesn't think she can lay on her back for another test for two hours at this point. Always has abdominal pain which improves with bowel movement. Denies hematochezia, melena. Does have nausea as per above. Also states she's not digesting raw vegetables at all. Denies chest pain, dyspnea, dizziness, lightheadedness, syncope, near syncope. Denies any other upper or lower GI symptoms.   Past Medical History:  Diagnosis Date  . Atrial fibrillation (HCC)    Paroxysmal  . Bladder dysfunction    Self urinary catheterization  . COPD (chronic obstructive pulmonary  disease) ([place])   . DDD (degenerative disc disease)   . Depression   . Esophageal spasm    NTG and Norvasc  . Essential hypertension, benign   . Fibromyalgia   . Gastroparesis   . GERD (gastroesophageal reflux disease)   . History of cardiac catheterization    Normal coronary arteries 11/12  . History of pituitary tumor   . Irritable bowel syndrome   . Lymphedema   . Osteoarthritis   . Sleep apnea    BIPAP  . Type 2 diabetes mellitus ([place])   . Urinary tract infection     Past Surgical History:  Procedure Laterality Date  . ABDOMINAL HYSTERECTOMY    . ANTERIOR CERVICAL DECOMP/DISCECTOMY FUSION N/A 12/02/2014   Procedure: Cervical five cervical six anterior cervical decompression with fusion interbody prosthesis plating and bone graft;  Surgeon: Newman Pies, MD;  Location: [facility];  Service: Neurosurgery;  Laterality: N/A;  C56 anterior cervical decompression with fusion interbody prosthesis plating and bonegraft  . APPENDECTOMY    . BACK SURGERY    . CARDIAC CATHETERIZATION    . CARDIOVERSION N/A 03/26/2014   Procedure: CARDIOVERSION;  Surgeon: Herminio Commons, MD;  Location: [facility];  Service: Endoscopy;  Laterality: N/A;  . CARDIOVERSION N/A 05/04/2014   Procedure: CARDIOVERSION;  Surgeon: Satira Sark, MD;  Location: [facility];  Service: Cardiovascular;  Laterality: N/A;  . CHOLECYSTECTOMY    . COLONOSCOPY     Approximately 2003. Per medical records, internal hemorrhoids noted  . COLONOSCOPY N/A 06/25/2013  Dr. Gala Romney: Anal canal hemorrhoids-more likely the source of paper hematochezia. Redundant, capacious colon. Multiple colonic polyps-tubular adenoma  . ESOPHAGOGASTRODUODENOSCOPY  10/12/2004   [id number] plaquing on the esophageal mucosa of uncertain significance, not typical of what is seen with candida esophagitis status post KOH brushing for KOH prep and biopsy for histology. Rule out candida esophagitis/eosinophilic esophagitis. Otherwise normal esophagus. Tiny  hiatal hernia. Otherwise, normal stomach, normal D1 and D2. Benign biopsy of esophagus, unknown KOH status.   . ESOPHAGOGASTRODUODENOSCOPY N/A 06/25/2013   Dr. Rourk:mild chronic gastritis  . KNEE SURGERY    . LEFT HEART CATHETERIZATION WITH CORONARY ANGIOGRAM N/A 11/15/2010   Procedure: LEFT HEART CATHETERIZATION WITH CORONARY ANGIOGRAM;  Surgeon: Laverda Page, MD;  Location: [facility] CATH LAB;  Service: Cardiovascular;  Laterality: N/A;  . LUNG BIOPSY      Current Outpatient Prescriptions  Medication Sig Dispense Refill  . albuterol (PROAIR HFA) 108 (90 BASE) MCG/ACT inhaler Inhale 2 puffs into the lungs every 6 (six) hours as needed for wheezing or shortness of breath.     Marland Kitchen albuterol (PROVENTIL) (2.5 MG/3ML) 0.083% nebulizer solution Take 2.5 mg by nebulization every 6 (six) hours as needed for wheezing. For shortness of breath.    Marland Kitchen atorvastatin (LIPITOR) 10 MG tablet Take 10 mg by mouth at bedtime.  3  . baclofen (LIORESAL) 20 MG tablet Take 20 mg by mouth 3 (three) times daily.      Marland Kitchen buPROPion (WELLBUTRIN) 75 MG tablet Take 1 tablet (75 mg total) by mouth daily. 30 tablet 3  . dexlansoprazole (DEXILANT) 60 MG capsule Take 1 capsule (60 mg total) by mouth daily. 90 capsule 3  . diazepam (VALIUM) 5 MG tablet Take 5 mg by mouth every 12 (twelve) hours as needed for anxiety. For anxiety.    . diclofenac sodium (VOLTAREN) 1 % GEL Apply 2-4 g topically 4 (four) times daily as needed. For pain    . dicyclomine (BENTYL) 10 MG capsule Take 1 capsule (10 mg total) by mouth 4 (four) times daily -  before meals and at bedtime. For abdominal cramping and loose stools (Patient taking differently: Take 10 mg by mouth 4 (four) times daily as needed. For abdominal cramping and loose stools) 120 capsule 5  . diltiazem (CARDIZEM CD) 120 MG 24 hr capsule TAKE 1 CAPSULE BY MOUTH EVERY DAY 90 capsule 3  . flecainide (TAMBOCOR) 50 MG tablet TAKE 2 TABLETS BY MOUTH TWICE DAILY 120 tablet 6  . furosemide (LASIX)  80 MG tablet Take 80 mg by mouth 2 (two) times daily.      Marland Kitchen gabapentin (NEURONTIN) 600 MG tablet Take 600 mg by mouth 4 (four) times daily.    Marland Kitchen glimepiride (AMARYL) 2 MG tablet Take 2 mg by mouth 2 (two) times daily. 4mg  in the morning and 2mg  in the evening    . hydrocortisone (PROCTOSOL HC) 2.5 % rectal cream Place 1 application rectally 2 (two) times daily. (Patient taking differently: Place 1 application rectally daily as needed for hemorrhoids. ) 30 g 0  . isosorbide mononitrate (IMDUR) 120 MG 24 hr tablet Take 120 mg by mouth every morning.     . isosorbide mononitrate (IMDUR) 30 MG 24 hr tablet Take 30 mg by mouth every evening.     . linaclotide (LINZESS) 290 MCG CAPS capsule Take 290 mcg by mouth daily before breakfast.    . lisinopril (PRINIVIL,ZESTRIL) 2.5 MG tablet Take 2.5 mg by mouth at bedtime.     Marland Kitchen loratadine (CLARITIN)  10 MG tablet Take 10 mg by mouth daily.      . meclizine (ANTIVERT) 25 MG tablet Take 25 mg by mouth 3 (three) times daily as needed for dizziness.     . metFORMIN (GLUCOPHAGE) 500 MG tablet Take 1,000 mg by mouth 2 (two) times daily.     . nitroGLYCERIN (NITROLINGUAL) 0.4 MG/SPRAY spray PLACE 1 SPRAY UNDER THE TONGUE AS NEEDED. FOR CHEST PAIN 12 g 3  . NON FORMULARY BIPAP - STARTED ABOUT 1 1/2 years AGO.    Marland Kitchen nystatin (MYCOSTATIN/NYSTOP) 100000 UNIT/GM POWD Apply 1 g topically daily.     . ondansetron (ZOFRAN) 4 MG tablet Take 1 tablet (4 mg total) by mouth every 8 (eight) hours as needed for nausea or vomiting. 30 tablet 1  . OPANA ER, CRUSH RESISTANT, 20 MG T12A Take 1 tablet by mouth 2 (two) times daily.    Marland Kitchen oxyCODONE-acetaminophen (PERCOCET) 10-325 MG per tablet Take 1 tablet by mouth every 6 (six) hours as needed. For pain.    . polyethylene glycol powder (GLYCOLAX/MIRALAX) powder Take 17 g by mouth daily.     . potassium chloride SA (K-DUR,KLOR-CON) 20 MEQ tablet Take 40 mEq by mouth 4 (four) times daily.      . ranitidine (ZANTAC) 150 MG tablet Take 150  mg by mouth as needed.     . Rivaroxaban (XARELTO) 20 MG TABS Take 20 mg by mouth daily.    Marland Kitchen spironolactone (ALDACTONE) 50 MG tablet Take 50 mg by mouth 3 (three) times daily.       No current facility-administered medications for this visit.     Allergies as of 08/16/2015 - Review Complete 08/16/2015  Allergen Reaction Noted  . Hyoscyamine sulfate Other (See Comments)   . Reglan [metoclopramide] Other (See Comments) 11/05/2014    Family History  Problem Relation Age of Onset  . Coronary artery disease Father     Died age 19  . Heart attack Father   . Arrhythmia Father     AF  . Diabetes Father   . Parkinson's disease Father   . Arrhythmia Mother     AF  . Stroke Mother   . Dementia Mother   . Cancer Mother     UTERINE   . Parkinson's disease Mother   . Arrhythmia Brother     AF  . Colon cancer Maternal Grandfather   . Colon cancer Paternal Aunt   . Colon cancer Paternal Aunt   . Diabetes Son   . Cancer Sister     BREAST   . Depression Sister   . Depression Sister   . Depression Sister     Social History   Social History  . Marital status: Divorced    Spouse name: N/A  . Number of children: N/A  . Years of education: N/A   Social History Main Topics  . Smoking status: Former Smoker    Packs/day: 1.50    Years: 30.00    Types: Cigarettes    Start date: 03/06/1977    Quit date: 01/03/2007  . Smokeless tobacco: Never Used  . Alcohol use No  . Drug use: No  . Sexual activity: No   Other Topics Concern  . None   Social History Narrative  . None    Review of Systems: General: Negative for anorexia, weight loss, fever, chills, fatigue, weakness. CV: Negative for chest pain, angina, palpitations, peripheral edema.  Respiratory: Negative for dyspnea at rest, cough, sputum, wheezing.  GI: See history  of present illness. Endo: Negative for unusual weight change.    Physical Exam: BP 125/78   Pulse 76   Temp (!) 96.9 F (36.1 C) (Oral)   Ht 5\' 9"   (1.753 m)   Wt (!) 307 lb (139.3 kg)   BMI 45.34 kg/m  General:   Morbidly obese female, alert and oriented. Pleasant and cooperative. Well-nourished and well-developed.  Ears:  Normal auditory acuity. Cardiovascular:  S1, S2 present without murmurs appreciated. Extremities without clubbing or edema. Respiratory:  Clear to auscultation bilaterally. No wheezes, rales, or rhonchi. No distress.  Gastrointestinal:  +BS, obese but soft, and non-distended. Generalized mild TTP, no overt pain. No HSM noted. No guarding or rebound. No masses appreciated but limited due to abdominal girth.  Rectal:  Deferred  Musculoskalatal:  Symmetrical without gross deformities. Neurologic:  Alert and oriented x4;  grossly normal neurologically. Psych:  Alert and cooperative. Normal mood and affect. Heme/Lymph/Immune: No excessive bruising noted.    08/16/2015 2:19 PM   Disclaimer: This note was dictated with voice recognition software. Similar sounding words can inadvertently be transcribed and may not be corrected upon review.

## 2015-08-16 NOTE — Assessment & Plan Note (Signed)
Issue with a history of gastroparesis per gastric emptying study completed in 2009. Does have nausea postprandially, likely multifactorial in nature including uncontrolled GERD and gastroparesis. I am changing her PPI as noted above. She is artery tried Reglan and cannot tolerate the medication. I provided her with information and verbal education on gastroparesis diet. Offered nutritionist consult which she is declined at this time of May be interested in in the future. Continue Zofran for nausea, cannot tolerate Phenergan due to drowsiness. Return for follow-up in 2 months.

## 2015-08-16 NOTE — Assessment & Plan Note (Signed)
Patient with continued GERD symptoms, feels Nexium worked better than Dexilant. I will stop her Dexilant have her start Nexium 40 mg daily. Her GERD symptoms are likely exacerbated by her abdominal girth. Offered nutritionist consult which she would like to hold off at this point. Return for follow-up in 2 months

## 2015-08-16 NOTE — Patient Instructions (Addendum)
1. Stop taking Dexilant, start taking Nexium 40 mg once a day. Exline I send a prescription of Nexium to your pharmacy. 2. Have your x-ray of your stomach done when you're able to. 3. Start using prune juice/MiraLAX more regularly to help her bowel movements. 4. Continue taking Zofran as needed for nausea. 5. Try to eat a gastroparesis diet, generally smaller amounts of food more frequently rather than 3 large meals a day. 6. I will provide you with more information about this below. 7. Return for follow-up in 2 months.     Gastroparesis Gastroparesis, also called delayed gastric emptying, is a condition in which food takes longer than normal to empty from the stomach. The condition is usually long-lasting (chronic). CAUSES This condition may be caused by:  An endocrine disorder, such as hypothyroidism or diabetes. Diabetes is the most common cause of this condition.  A nervous system disease, such as Parkinson disease or multiple sclerosis.  Cancer, infection, or surgery of the stomach or vagus nerve.  A connective tissue disorder, such as scleroderma.  Certain medicines. In most cases, the cause is not known. RISK FACTORS This condition is more likely to develop in:  People with certain disorders, including endocrine disorders, eating disorders, amyloidosis, and scleroderma.  People with certain diseases, including Parkinson disease or multiple sclerosis.  People with cancer or infection of the stomach or vagus nerve.  People who have had surgery on the stomach or vagus nerve.  People who take certain medicines.  Women. SYMPTOMS Symptoms of this condition include:  An early feeling of fullness when eating.  Nausea.  Weight loss.  Vomiting.  Heartburn.  Abdominal bloating.  Inconsistent blood glucose levels.  Lack of appetite.  Acid from the stomach coming up into the esophagus (gastroesophageal reflux).  Spasms of the stomach. Symptoms may come and  go. DIAGNOSIS This condition is diagnosed with tests, such as:  Tests that check how long it takes food to move through the stomach and intestines. These tests include:  Upper gastrointestinal (GI) series. In this test, X-rays of the intestines are taken after you drink a liquid. The liquid makes the intestines show up better on the X-rays.  Gastric emptying scintigraphy. In this test, scans are taken after you eat food that contains a small amount of radioactive material.  Wireless capsule GI monitoring system. This test involves swallowing a capsule that records information about movement through the stomach.  Gastric manometry. This test measures electrical and muscular activity in the stomach. It is done with a thin tube that is passed down the throat and into the stomach.  Endoscopy. This test checks for abnormalities in the lining of the stomach. It is done with a long, thin tube that is passed down the throat and into the stomach.  An ultrasound. This test can help rule out gallbladder disease or pancreatitis as a cause of your symptoms. It uses sound waves to take pictures of the inside of your body. TREATMENT There is no cure for gastroparesis. This condition may be managed with:  Treatment of the underlying condition causing the gastroparesis.  Lifestyle changes, including exercise and dietary changes. Dietary changes can include:  Changes in what and when you eat.  Eating smaller meals more often.  Eating low-fat foods.  Eating low-fiber forms of high-fiber foods, such as cooked vegetables instead of raw vegetables.  Having liquid foods in place of solid foods. Liquid foods are easier to digest.  Medicines. These may be given to control nausea  and vomiting and to stimulate stomach muscles.  Getting food through a feeding tube. This may be done in severe cases.  A gastric neurostimulator. This is a device that is inserted into the body with surgery. It helps improve  stomach emptying and control nausea and vomiting. HOME CARE INSTRUCTIONS  Follow your health care provider's instructions about exercise and diet.  Take medicines only as directed by your health care provider. SEEK MEDICAL CARE IF:  Your symptoms do not improve with treatment.  You have new symptoms. SEEK IMMEDIATE MEDICAL CARE IF:  You have severe abdominal pain that does not improve with treatment.  You have nausea that does not go away.  You cannot keep fluids down.   This information is not intended to replace advice given to you by your health care provider. Make sure you discuss any questions you have with your health care provider.   Document Released: 12/19/2004 Document Revised: 05/05/2014 Document Reviewed: 12/15/2013 Elsevier Interactive Patient Education [employer].

## 2015-08-30 ENCOUNTER — Telehealth: Payer: Self-pay | Admitting: Internal Medicine

## 2015-08-30 NOTE — Telephone Encounter (Signed)
Patient was given generic nexium and it is not working, can she get the named brand nexium because it may work better, also has questions about her reflux [phone]

## 2015-08-31 NOTE — Telephone Encounter (Signed)
I spoke with the pt, she continues to have problems with reflux and her gastroparesis. She feels like the brand name nexium worked better for her. I advised her that I would try to get that approved for her.   She then tells me that her gastroparesis is causing her a lot of problems. She is trying to eat smaller meals but is having a hard time with it. She ate pintos and quash yesterday and all of it came back up. She is having a lot of nausea. Taking zofran but it doesn't seem to work all the time. I asked her if she had a copy of the gastroparesis diet and she said no. I went over the diet with her, I have emailed her a copy of it and I have mailed her a copy of it. She said she understood it and wanted to try this prior to seeing a nutritionist.   Pt also has concerns about the "flapper" in her stomach. She said RMR told her 20 years ago that the medication she takes would make it more "relaxed" than it should be. She was on Opana but now she is on Morphine ER (this changed about a month ago) but she wonders if she needed to have surgery on it now? And if the morphine would make it more "relaxed" than it would be on Opana?

## 2015-09-01 ENCOUNTER — Encounter: Payer: Self-pay | Admitting: Internal Medicine

## 2015-09-02 ENCOUNTER — Encounter: Payer: Self-pay | Admitting: Internal Medicine

## 2015-09-08 NOTE — Telephone Encounter (Signed)
Please call and check on her symptoms since reviewing the gastroparesis diet. I am not sure if Morphine would cause GE spincter laxity. If she's having persistent problems we can refer her to [facility] for monometry.

## 2015-09-09 DIAGNOSIS — E1142 Type 2 diabetes mellitus with diabetic polyneuropathy: Secondary | ICD-10-CM | POA: Diagnosis not present

## 2015-09-09 DIAGNOSIS — M4806 Spinal stenosis, lumbar region: Secondary | ICD-10-CM | POA: Diagnosis not present

## 2015-09-09 DIAGNOSIS — Z79891 Long term (current) use of opiate analgesic: Secondary | ICD-10-CM | POA: Diagnosis not present

## 2015-09-09 DIAGNOSIS — G894 Chronic pain syndrome: Secondary | ICD-10-CM | POA: Diagnosis not present

## 2015-09-09 NOTE — Telephone Encounter (Signed)
Tried to call- NA 

## 2015-09-10 DIAGNOSIS — N819 Female genital prolapse, unspecified: Secondary | ICD-10-CM | POA: Diagnosis not present

## 2015-09-13 ENCOUNTER — Other Ambulatory Visit: Payer: Self-pay | Admitting: Nurse Practitioner

## 2015-09-13 DIAGNOSIS — K582 Mixed irritable bowel syndrome: Secondary | ICD-10-CM

## 2015-09-13 DIAGNOSIS — K219 Gastro-esophageal reflux disease without esophagitis: Secondary | ICD-10-CM

## 2015-09-13 DIAGNOSIS — R110 Nausea: Secondary | ICD-10-CM

## 2015-09-14 ENCOUNTER — Other Ambulatory Visit: Payer: Self-pay

## 2015-09-14 DIAGNOSIS — M542 Cervicalgia: Secondary | ICD-10-CM | POA: Diagnosis not present

## 2015-09-14 DIAGNOSIS — M545 Low back pain: Secondary | ICD-10-CM | POA: Diagnosis not present

## 2015-09-14 DIAGNOSIS — R0682 Tachypnea, not elsewhere classified: Secondary | ICD-10-CM | POA: Diagnosis not present

## 2015-09-14 DIAGNOSIS — M4806 Spinal stenosis, lumbar region: Secondary | ICD-10-CM | POA: Diagnosis not present

## 2015-09-14 NOTE — Telephone Encounter (Signed)
Pt is aware. She wants to wait on the referral to [facility] right now and she will call back and let us know if she decides to go.   I have tried to do a PA for brand nexium and insurance co is telling me that the pt has access to the medication and does not need a PA. Pt is aware of this as well and is going to call the pharmacy and will find out if she can get it.

## 2015-09-15 ENCOUNTER — Encounter (HOSPITAL_COMMUNITY): Payer: Self-pay | Admitting: Family Medicine

## 2015-09-15 ENCOUNTER — Observation Stay (HOSPITAL_COMMUNITY)
Admission: EM | Admit: 2015-09-15 | Discharge: 2015-09-15 | Disposition: A | Discharge status: Home / Self Care | Payer: Medicare Other | Attending: Internal Medicine | Admitting: Internal Medicine

## 2015-09-15 ENCOUNTER — Observation Stay (HOSPITAL_BASED_OUTPATIENT_CLINIC_OR_DEPARTMENT_OTHER): Payer: Medicare Other

## 2015-09-15 ENCOUNTER — Other Ambulatory Visit: Payer: Self-pay

## 2015-09-15 ENCOUNTER — Emergency Department (HOSPITAL_COMMUNITY): Payer: Medicare Other

## 2015-09-15 ENCOUNTER — Other Ambulatory Visit: Payer: Self-pay | Admitting: Neurosurgery

## 2015-09-15 DIAGNOSIS — I480 Paroxysmal atrial fibrillation: Secondary | ICD-10-CM

## 2015-09-15 DIAGNOSIS — R0789 Other chest pain: Secondary | ICD-10-CM

## 2015-09-15 DIAGNOSIS — E119 Type 2 diabetes mellitus without complications: Secondary | ICD-10-CM | POA: Diagnosis not present

## 2015-09-15 DIAGNOSIS — R079 Chest pain, unspecified: Secondary | ICD-10-CM | POA: Diagnosis not present

## 2015-09-15 DIAGNOSIS — G4733 Obstructive sleep apnea (adult) (pediatric): Secondary | ICD-10-CM

## 2015-09-15 DIAGNOSIS — Z7984 Long term (current) use of oral hypoglycemic drugs: Secondary | ICD-10-CM | POA: Insufficient documentation

## 2015-09-15 DIAGNOSIS — R600 Localized edema: Secondary | ICD-10-CM

## 2015-09-15 DIAGNOSIS — M797 Fibromyalgia: Secondary | ICD-10-CM | POA: Diagnosis present

## 2015-09-15 DIAGNOSIS — Z87891 Personal history of nicotine dependence: Secondary | ICD-10-CM | POA: Diagnosis not present

## 2015-09-15 DIAGNOSIS — R0602 Shortness of breath: Secondary | ICD-10-CM | POA: Diagnosis not present

## 2015-09-15 DIAGNOSIS — I10 Essential (primary) hypertension: Secondary | ICD-10-CM | POA: Insufficient documentation

## 2015-09-15 DIAGNOSIS — Z79899 Other long term (current) drug therapy: Secondary | ICD-10-CM | POA: Insufficient documentation

## 2015-09-15 DIAGNOSIS — M48061 Spinal stenosis, lumbar region without neurogenic claudication: Secondary | ICD-10-CM

## 2015-09-15 DIAGNOSIS — E1143 Type 2 diabetes mellitus with diabetic autonomic (poly)neuropathy: Secondary | ICD-10-CM | POA: Diagnosis present

## 2015-09-15 DIAGNOSIS — J449 Chronic obstructive pulmonary disease, unspecified: Secondary | ICD-10-CM

## 2015-09-15 DIAGNOSIS — K3184 Gastroparesis: Secondary | ICD-10-CM

## 2015-09-15 DIAGNOSIS — K219 Gastro-esophageal reflux disease without esophagitis: Secondary | ICD-10-CM | POA: Diagnosis present

## 2015-09-15 LAB — ECHOCARDIOGRAM COMPLETE
EERAT: 10.48
EWDT: 282 ms
FS: 48 % — AB (ref 28–44)
Height: 69 in
IV/PV OW: 1.24
LA ID, A-P, ES: 39 mm
LA diam end sys: 39 mm
LA diam index: 1.57 cm/m2
LA vol A4C: 70.8 ml
LA vol index: 28.8 mL/m2
LA vol: 71.8 mL
LDCA: 3.14 cm2
LV E/e' medial: 10.48
LV TDI E'LATERAL: 8.05
LV TDI E'MEDIAL: 6.74
LVEEAVG: 10.48
LVELAT: 8.05 cm/s
LVOTD: 20 mm
MV Dec: 282
MV Peak grad: 3 mmHg
MV pk A vel: 113 m/s
MV pk E vel: 84.4 m/s
PW: 12.9 mm — AB (ref 0.6–1.1)
TAPSE: 27 mm
Weight: 4960 oz

## 2015-09-15 LAB — URINALYSIS, ROUTINE W REFLEX MICROSCOPIC
Bilirubin Urine: NEGATIVE
Glucose, UA: 250 mg/dL — AB
Hgb urine dipstick: NEGATIVE
KETONES UR: NEGATIVE mg/dL
LEUKOCYTES UA: NEGATIVE
NITRITE: POSITIVE — AB
PH: 6 (ref 5.0–8.0)
Protein, ur: NEGATIVE mg/dL
SPECIFIC GRAVITY, URINE: 1.01 (ref 1.005–1.030)

## 2015-09-15 LAB — TROPONIN I
Troponin I: <0.03 ng/mL (ref <0.03)
Troponin I: <0.03 ng/mL (ref <0.03)

## 2015-09-15 LAB — CBC WITH DIFFERENTIAL/PLATELET
BASOS ABS: 0 10*3/uL (ref 0.0–0.1)
Basophils Relative: 1 %
Eosinophils Absolute: 0.4 10*3/uL (ref 0.0–0.7)
Eosinophils Relative: 5 %
HEMATOCRIT: 35.5 % — AB (ref 36.0–46.0)
HEMOGLOBIN: 11.9 g/dL — AB (ref 12.0–15.0)
LYMPHS ABS: 3.5 10*3/uL (ref 0.7–4.0)
LYMPHS PCT: 40 %
MCH: 29.3 pg (ref 26.0–34.0)
MCHC: 33.5 g/dL (ref 30.0–36.0)
MCV: 87.4 fL (ref 78.0–100.0)
Monocytes Absolute: 0.7 10*3/uL (ref 0.1–1.0)
Monocytes Relative: 8 %
NEUTROS ABS: 4.1 10*3/uL (ref 1.7–7.7)
Neutrophils Relative %: 47 %
Platelets: 273 10*3/uL (ref 150–400)
RBC: 4.06 MIL/uL (ref 3.87–5.11)
RDW: 12.8 % (ref 11.5–15.5)
WBC: 8.7 10*3/uL (ref 4.0–10.5)

## 2015-09-15 LAB — BRAIN NATRIURETIC PEPTIDE: B Natriuretic Peptide: 40 pg/mL (ref 0.0–100.0)

## 2015-09-15 LAB — BASIC METABOLIC PANEL
ANION GAP: 13 (ref 5–15)
BUN: 8 mg/dL (ref 6–20)
CHLORIDE: 96 mmol/L — AB (ref 101–111)
CO2: 26 mmol/L (ref 22–32)
Calcium: 9.6 mg/dL (ref 8.9–10.3)
Creatinine, Ser: 0.6 mg/dL (ref 0.44–1.00)
GFR calc Af Amer: >60 mL/min (ref >60)
GLUCOSE: 283 mg/dL — AB (ref 65–99)
POTASSIUM: 3.5 mmol/L (ref 3.5–5.1)
Sodium: 135 mmol/L (ref 135–145)

## 2015-09-15 LAB — GLUCOSE, CAPILLARY
Glucose-Capillary: 257 mg/dL — ABNORMAL HIGH (ref 65–99)
Glucose-Capillary: 285 mg/dL — ABNORMAL HIGH (ref 65–99)

## 2015-09-15 LAB — URINE MICROSCOPIC-ADD ON

## 2015-09-15 LAB — D-DIMER, QUANTITATIVE (NOT AT ARMC): D DIMER QUANT: 0.36 ug{FEU}/mL (ref 0.00–0.50)

## 2015-09-15 MED ORDER — MORPHINE SULFATE (PF) 2 MG/ML IV SOLN
2.0000 mg | INTRAVENOUS | Status: DC | PRN
  Administered 2015-09-15: 2 mg via INTRAVENOUS
  Filled 2015-09-15: qty 1

## 2015-09-15 MED ORDER — FUROSEMIDE 10 MG/ML IJ SOLN
INTRAMUSCULAR | Status: AC
  Filled 2015-09-15: qty 4

## 2015-09-15 MED ORDER — MORPHINE SULFATE ER 30 MG PO TBCR
60.0000 mg | EXTENDED_RELEASE_TABLET | Freq: Two times a day (BID) | ORAL | Status: DC
  Administered 2015-09-15: 60 mg via ORAL
  Filled 2015-09-15: qty 2

## 2015-09-15 MED ORDER — ASPIRIN EC 81 MG PO TBEC: 81.0000 mg | DELAYED_RELEASE_TABLET | Freq: Every day | ORAL | 0 refills | Status: DC

## 2015-09-15 MED ORDER — DIAZEPAM 5 MG PO TABS: 5.0000 mg | ORAL_TABLET | Freq: Two times a day (BID) | ORAL | Status: DC | PRN

## 2015-09-15 MED ORDER — SPIRONOLACTONE 25 MG PO TABS
50.0000 mg | ORAL_TABLET | Freq: Three times a day (TID) | ORAL | Status: DC
  Administered 2015-09-15: 50 mg via ORAL
  Filled 2015-09-15: qty 2

## 2015-09-15 MED ORDER — METHOCARBAMOL 500 MG PO TABS: 500.0000 mg | ORAL_TABLET | Freq: Three times a day (TID) | ORAL | 0 refills | Status: DC | PRN

## 2015-09-15 MED ORDER — ACETAMINOPHEN 325 MG PO TABS: 650.0000 mg | ORAL_TABLET | ORAL | Status: DC | PRN

## 2015-09-15 MED ORDER — OXYMORPHONE HCL ER 20 MG PO T12A: 1.0000 | EXTENDED_RELEASE_TABLET | Freq: Two times a day (BID) | ORAL | Status: DC

## 2015-09-15 MED ORDER — RIVAROXABAN 20 MG PO TABS
20.0000 mg | ORAL_TABLET | Freq: Every day | ORAL | Status: DC
  Administered 2015-09-15: 20 mg via ORAL
  Filled 2015-09-15 (×2): qty 1

## 2015-09-15 MED ORDER — ALBUTEROL SULFATE (2.5 MG/3ML) 0.083% IN NEBU: 3.0000 mL | INHALATION_SOLUTION | Freq: Four times a day (QID) | RESPIRATORY_TRACT | Status: DC | PRN

## 2015-09-15 MED ORDER — ASPIRIN 81 MG PO CHEW
CHEWABLE_TABLET | ORAL | Status: AC
  Filled 2015-09-15: qty 4

## 2015-09-15 MED ORDER — METHOCARBAMOL 500 MG PO TABS
500.0000 mg | ORAL_TABLET | Freq: Three times a day (TID) | ORAL | Status: DC | PRN
  Administered 2015-09-15: 500 mg via ORAL
  Filled 2015-09-15: qty 1

## 2015-09-15 MED ORDER — ASPIRIN EC 325 MG PO TBEC
325.0000 mg | DELAYED_RELEASE_TABLET | Freq: Every day | ORAL | Status: DC
  Administered 2015-09-15: 325 mg via ORAL
  Filled 2015-09-15: qty 1

## 2015-09-15 MED ORDER — FUROSEMIDE 80 MG PO TABS
80.0000 mg | ORAL_TABLET | Freq: Two times a day (BID) | ORAL | Status: DC
  Administered 2015-09-15: 80 mg via ORAL
  Filled 2015-09-15: qty 1

## 2015-09-15 MED ORDER — ONDANSETRON HCL 4 MG/2ML IJ SOLN: 4.0000 mg | Freq: Four times a day (QID) | INTRAMUSCULAR | Status: DC | PRN

## 2015-09-15 MED ORDER — ALBUTEROL SULFATE (2.5 MG/3ML) 0.083% IN NEBU: 2.5000 mg | INHALATION_SOLUTION | Freq: Four times a day (QID) | RESPIRATORY_TRACT | Status: DC | PRN

## 2015-09-15 NOTE — Progress Notes (Signed)
Inpatient Diabetes Program Recommendations  AACE/ADA: New Consensus Statement on Inpatient Glycemic Control (2015)  Target Ranges:  Prepandial:   less than 140 mg/dL      Peak postprandial:   less than 180 mg/dL (1-2 hours)      Critically ill patients:  140 - 180 mg/dL   Lab Results  Component Value Date   GLUCAP 227 (H) 12/03/2014    Review of Glycemic Control  Diabetes history: DM 2 Outpatient Diabetes medications: Amaryl 4 mg QAM, 2 mg QPM, Metformin 1,000 mg BID Current orders for Inpatient glycemic control: None  Inpatient Diabetes Program Recommendations:   Patient has a history of DM and takes 2 oral medications at home. Glucose 283 in labs this morning at 0200 am. Please consider starting Novolog Moderate TID + HS while inpatient.  Thanks,  Tama Headings RN, MSN, [facility] Inpatient Diabetes Coordinator Team Pager [phone] (8a-5p)

## 2015-09-15 NOTE — Progress Notes (Signed)
Pt complained of chest pain 6/10, prn morphine administered.

## 2015-09-15 NOTE — Progress Notes (Signed)
Pt stable. Brother and sister for transportation home. IV removed with catheter intact. Telemetry box removed. PT transported via wheelchair by Domenick Bookbinder. Discharge instructions and medications reviewed. PT verbalized understanding. Prescriptions to be picked up from [place]. Pts questions and needs addressed.

## 2015-09-15 NOTE — H&P (Signed)
History and Physical    [name] [name] [id number] DOB: [birth year] DOA: 09/15/2015  PCP: Glenda Chroman, MD  Patient coming from:  home  Chief Complaint:   Chest pain, sob  HPI: [name] [name] is a 55 y.o. female with medical history significant of chronic ble lymphedema for over 2 years, afib on xaralto, COPD, MO, fibromyalgia comes in with sscp that radiated to left chest today that lasted over 20 minutes and was associated with sob.  She denies any cough or fevers.  Pain spontaneously resolved.  Pain was exertional.  She has had worsened ble swelling the last 2 days.  She is on xaralto, and lasix and spirinolactone and reports she takes all her meds.  Pt referred for admission for possible ACS.  She has no h/o CHF.    Review of Systems: As per HPI otherwise 10 point review of systems negative.   Past Medical History:  Diagnosis Date  . Atrial fibrillation (HCC)    Paroxysmal  . Bladder dysfunction    Self urinary catheterization  . COPD (chronic obstructive pulmonary disease) ([place])   . DDD (degenerative disc disease)   . Depression   . Esophageal spasm    NTG and Norvasc  . Essential hypertension, benign   . Fibromyalgia   . Gastroparesis   . GERD (gastroesophageal reflux disease)   . History of cardiac catheterization    Normal coronary arteries 11/12  . History of pituitary tumor   . Irritable bowel syndrome   . Lymphedema   . Osteoarthritis   . Sleep apnea    BIPAP  . Type 2 diabetes mellitus ([name])   . Urinary tract infection     Past Surgical History:  Procedure Laterality Date  . ABDOMINAL HYSTERECTOMY    . ANTERIOR CERVICAL DECOMP/DISCECTOMY FUSION N/A 12/02/2014   Procedure: Cervical five cervical six anterior cervical decompression with fusion interbody prosthesis plating and bone graft;  Surgeon: Newman Pies, MD;  Location: [facility];  Service: Neurosurgery;  Laterality: N/A;  C56 anterior cervical decompression with fusion interbody prosthesis  plating and bonegraft  . APPENDECTOMY    . BACK SURGERY    . CARDIAC CATHETERIZATION    . CARDIOVERSION N/A 03/26/2014   Procedure: CARDIOVERSION;  Surgeon: Herminio Commons, MD;  Location: [facility];  Service: Endoscopy;  Laterality: N/A;  . CARDIOVERSION N/A 05/04/2014   Procedure: CARDIOVERSION;  Surgeon: Satira Sark, MD;  Location: [facility];  Service: Cardiovascular;  Laterality: N/A;  . CHOLECYSTECTOMY    . COLONOSCOPY     Approximately 2003. Per medical records, internal hemorrhoids noted  . COLONOSCOPY N/A 06/25/2013   Dr. Gala Romney: Anal canal hemorrhoids-more likely the source of paper hematochezia. Redundant, capacious colon. Multiple colonic polyps-tubular adenoma  . ESOPHAGOGASTRODUODENOSCOPY  10/12/2004   [id number] plaquing on the esophageal mucosa of uncertain significance, not typical of what is seen with candida esophagitis status post KOH brushing for KOH prep and biopsy for histology. Rule out candida esophagitis/eosinophilic esophagitis. Otherwise normal esophagus. Tiny hiatal hernia. Otherwise, normal stomach, normal D1 and D2. Benign biopsy of esophagus, unknown KOH status.   . ESOPHAGOGASTRODUODENOSCOPY N/A 06/25/2013   Dr. Rourk:mild chronic gastritis  . KNEE SURGERY    . LEFT HEART CATHETERIZATION WITH CORONARY ANGIOGRAM N/A 11/15/2010   Procedure: LEFT HEART CATHETERIZATION WITH CORONARY ANGIOGRAM;  Surgeon: Laverda Page, MD;  Location: [facility] C[facility];  Service: Cardiovascular;  Laterality: N/A;  . LUNG BIOPSY       reports that she quit  smoking about 8 years ago. Her smoking use included Cigarettes. She started smoking about 38 years ago. She has a 45.00 pack-year smoking history. She has never used smokeless tobacco. She reports that she does not drink alcohol or use drugs.  Allergies  Allergen Reactions  . Hyoscyamine Sulfate Other (See Comments)    TACHYCARDIA  . Reglan [Metoclopramide] Other (See Comments)    tremors    Family History  Problem Relation Age  of Onset  . Coronary artery disease Father     Died age 66  . Heart attack Father   . Arrhythmia Father     AF  . Diabetes Father   . Parkinson's disease Father   . Arrhythmia Mother     AF  . Stroke Mother   . Dementia Mother   . Cancer Mother     UTERINE   . Parkinson's disease Mother   . Arrhythmia Brother     AF  . Colon cancer Maternal Grandfather   . Colon cancer Paternal Aunt   . Colon cancer Paternal Aunt   . Diabetes Son   . Cancer Sister     BREAST   . Depression Sister   . Depression Sister   . Depression Sister     Prior to Admission medications   Medication Sig Start Date End Date Taking? Authorizing Provider  albuterol (PROAIR HFA) 108 (90 BASE) MCG/ACT inhaler Inhale 2 puffs into the lungs every 6 (six) hours as needed for wheezing or shortness of breath.     Historical Provider, MD  albuterol (PROVENTIL) (2.5 MG/3ML) 0.083% nebulizer solution Take 2.5 mg by nebulization every 6 (six) hours as needed for wheezing. For shortness of breath.    Historical Provider, MD  atorvastatin (LIPITOR) 10 MG tablet Take 10 mg by mouth at bedtime. 03/12/15   Historical Provider, MD  baclofen (LIORESAL) 20 MG tablet Take 20 mg by mouth 3 (three) times daily.      Historical Provider, MD  buPROPion (WELLBUTRIN) 75 MG tablet Take 1 tablet (75 mg total) by mouth daily. 06/01/15 05/31/16  Cloria Spring, MD  diazepam (VALIUM) 5 MG tablet Take 5 mg by mouth every 12 (twelve) hours as needed for anxiety. For anxiety.    Historical Provider, MD  diclofenac sodium (VOLTAREN) 1 % GEL Apply 2-4 g topically 4 (four) times daily as needed. For pain    Historical Provider, MD  dicyclomine (BENTYL) 10 MG capsule Take 1 capsule (10 mg total) by mouth 4 (four) times daily -  before meals and at bedtime. For abdominal cramping and loose stools Patient taking differently: Take 10 mg by mouth 4 (four) times daily as needed. For abdominal cramping and loose stools 06/05/13   Annitta Needs, NP  diltiazem  (CARDIZEM CD) 120 MG 24 hr capsule TAKE 1 CAPSULE BY MOUTH EVERY DAY 04/19/15   Satira Sark, MD  esomeprazole (NEXIUM) 40 MG packet Take 40 mg by mouth daily before breakfast. 08/16/15   Carlis Stable, NP  flecainide (TAMBOCOR) 50 MG tablet TAKE 2 TABLETS BY MOUTH TWICE DAILY 07/12/15   Satira Sark, MD  furosemide (LASIX) 80 MG tablet Take 80 mg by mouth 2 (two) times daily.      Historical Provider, MD  gabapentin (NEURONTIN) 600 MG tablet Take 600 mg by mouth 4 (four) times daily.    Historical Provider, MD  glimepiride (AMARYL) 2 MG tablet Take 2 mg by mouth 2 (two) times daily. 4mg  in the morning and  2mg  in the evening 05/21/15   Historical Provider, MD  hydrocortisone (PROCTOSOL HC) 2.5 % rectal cream Place 1 application rectally 2 (two) times daily. Patient taking differently: Place 1 application rectally daily as needed for hemorrhoids.  08/20/13   Annitta Needs, NP  isosorbide mononitrate (IMDUR) 120 MG 24 hr tablet Take 120 mg by mouth every morning.     Historical Provider, MD  isosorbide mononitrate (IMDUR) 30 MG 24 hr tablet Take 30 mg by mouth every evening.     Historical Provider, MD  linaclotide (LINZESS) 290 MCG CAPS capsule Take 290 mcg by mouth daily before breakfast.    Historical Provider, MD  lisinopril (PRINIVIL,ZESTRIL) 2.5 MG tablet Take 2.5 mg by mouth at bedtime.  10/29/12   Historical Provider, MD  loratadine (CLARITIN) 10 MG tablet Take 10 mg by mouth daily.      Historical Provider, MD  meclizine (ANTIVERT) 25 MG tablet Take 25 mg by mouth 3 (three) times daily as needed for dizziness.  07/31/13   Historical Provider, MD  metFORMIN (GLUCOPHAGE) 500 MG tablet Take 1,000 mg by mouth 2 (two) times daily.  05/18/13   Historical Provider, MD  nitroGLYCERIN (NITROLINGUAL) 0.4 MG/SPRAY spray PLACE 1 SPRAY UNDER THE TONGUE AS NEEDED. FOR CHEST PAIN 06/18/15   Satira Sark, MD  NON FORMULARY BIPAP - STARTED ABOUT 1 1/2 years AGO.    Historical Provider, MD  nystatin  (MYCOSTATIN/NYSTOP) 100000 UNIT/GM POWD Apply 1 g topically daily.     Historical Provider, MD  ondansetron (ZOFRAN) 4 MG tablet Take 1 tablet (4 mg total) by mouth every 8 (eight) hours as needed for nausea or vomiting. 03/25/15   Carlis Stable, NP  OPANA ER, CRUSH RESISTANT, 20 MG T12A Take 1 tablet by mouth 2 (two) times daily. 04/10/12   Historical Provider, MD  oxyCODONE-acetaminophen (PERCOCET) 10-325 MG per tablet Take 1 tablet by mouth every 6 (six) hours as needed. For pain.    Historical Provider, MD  polyethylene glycol powder (GLYCOLAX/MIRALAX) powder Take 17 g by mouth daily.  08/25/13   Historical Provider, MD  potassium chloride SA (K-DUR,KLOR-CON) 20 MEQ tablet Take 40 mEq by mouth 4 (four) times daily.      Historical Provider, MD  ranitidine (ZANTAC) 150 MG tablet Take 150 mg by mouth as needed.     Historical Provider, MD  Rivaroxaban (XARELTO) 20 MG TABS Take 20 mg by mouth daily.    Historical Provider, MD  spironolactone (ALDACTONE) 50 MG tablet Take 50 mg by mouth 3 (three) times daily.      Historical Provider, MD    Physical Exam: Vitals:   09/15/15 0130 09/15/15 0215  BP: 118/69   Pulse: 70 69  Resp: 15   SpO2: 96% 96%   Constitutional: NAD, calm, comfortable Vitals:   09/15/15 0130 09/15/15 0215  BP: 118/69   Pulse: 70 69  Resp: 15   SpO2: 96% 96%   Eyes: PERRL, lids and conjunctivae normal ENMT: Mucous membranes are moist. Posterior pharynx clear of any exudate or lesions.Normal dentition.  Neck: normal, supple, no masses, no thyromegaly Respiratory: clear to auscultation bilaterally, no wheezing, no crackles. Normal respiratory effort. No accessory muscle use.  Cardiovascular: Regular rate and rhythm, no murmurs / rubs / gallops. . 2+ pedal pulses. No carotid bruits.  Abdomen: no tenderness, no masses palpated. No hepatosplenomegaly. Bowel sounds positive.  Musculoskeletal: no clubbing / cyanosis. No joint deformity upper and lower extremities. Good ROM, no  contractures. Normal muscle  tone.  Skin: no rashes, lesions, ulcers. No induration  2+ ble pitting edema Neurologic: CN 2-12 grossly intact. Sensation intact, DTR normal. Strength 5/5 in all 4.  Psychiatric: Normal judgment and insight. Alert and oriented x 3. Normal mood.    Labs on Admission: I have personally reviewed following labs and imaging studies  CBC:  Recent Labs Lab 09/15/15 0200  WBC 8.7  NEUTROABS 4.1  HGB 11.9*  HCT 35.5*  MCV 87.4  PLT 123456   Basic Metabolic Panel:  Recent Labs Lab 09/15/15 0200  NA 135  K 3.5  CL 96*  CO2 26  GLUCOSE 283*  BUN 8  CREATININE 0.60  CALCIUM 9.6   GFR: CrCl cannot be calculated (Unknown ideal weight.).  Cardiac Enzymes:  Recent Labs Lab 09/15/15 0200  TROPONINI <0.03    Radiological Exams on Admission: Dg Chest 2 View  Result Date: 09/15/2015 CLINICAL DATA:  Chest pain, shortness of breath and fatigue for 12 hours. Lower extremity swelling for 4 days. EXAM: CHEST  2 VIEW COMPARISON:  Radiographs 01/06/2013 FINDINGS: The cardiomediastinal contours are normal. Again seen hyperinflation and chronic bronchitic changes. Pulmonary vasculature is normal. No consolidation, pleural effusion, or pneumothorax. No acute osseous abnormalities are seen. IMPRESSION: Chronic hyperinflation and bronchitic change.  No acute abnormality. Electronically Signed   By: Jeb Levering M.D.   On: 09/15/2015 02:27    EKG: Independently reviewed. Ivcd, 1avb nsr cxr reviewed no edema or infiltrate Old chart reviewed Case discussed with dr ward in ed   Assessment/Plan 55 yo female with multiple risk factors comes in with chest pain and sob  Principal Problem:   Chest pain- romi, serial cardiac enzymes, check cardiac echo in the am.  Will also obtain cardiology consult.  Pt has chronic ble lymphedema, no prior h/o CHF.  On high doses of lasix and spirinolactone already.  She has h/o neg cath also in recent past.  She is certainly set up  for diastolic dysfunction, obtain cardiac echo.  Active Problems:  Stable unless o/w noted   Obstructive sleep apnea   Gastroparesis due to DM (HCC)   Paroxysmal atrial fibrillation (HCC)   Essential hypertension, benign   GERD (gastroesophageal reflux disease)   Fibromyalgia   COPD (chronic obstructive pulmonary disease) (HCC)     DVT prophylaxis:  On xaralto  Code Status:   full Family Communication:  none Disposition Plan:   Per day team Consults called:   cards Admission status:  obs   Kaydee Magel A MD [facility]  If 7PM-7AM, please contact night-coverage [email or web address] Password [email or web address]  09/15/2015, 3:02 AM

## 2015-09-15 NOTE — ED Provider Notes (Signed)
TIME SEEN: 1:00 AM  CHIEF COMPLAINT: chest pain, SOB, leg swelling  HPI: Pt is a 55 y.o. F with h/o a fib on Xarelto s/p cardioversion, COPD, hypertension, diabetes, hyperlipidemia, radius tobacco use who quit 6 years ago who presents to the emergency department with complaints of shortness of breath that started at 1 PM that was worse with exertion. Progressively worsen throughout the day and then she began having central to left-sided chest tightness without radiation. Also had nausea. No diaphoresis or new dizziness. States she has chronic venous or all of her medications. Also has no similar the past several days she has had increased lower extremity swelling, worse in the left leg than the right. No history of PE or DVT. No fever or cough. States her family has a strong history of CAD in her mother, father, sisters and brother. Her last stress test was many years ago. Reports she had a cardiac catheterization in 2012 which was normal. Does not have stents. Her cardiologist is Dr. Domenic Polite. Her PCP is Dr. Woody Seller.  She is currently chest pain-free.  ROS: See HPI Constitutional: no fever  Eyes: no drainage  ENT: no runny nose   Cardiovascular:   chest pain  Resp:  SOB  GI: no vomiting GU: no dysuria Integumentary: no rash  Allergy: no hives  Musculoskeletal: no leg swelling  Neurological: no slurred speech ROS otherwise negative  PAST MEDICAL HISTORY/PAST SURGICAL HISTORY:  Past Medical History:  Diagnosis Date  . Atrial fibrillation (HCC)    Paroxysmal  . Bladder dysfunction    Self urinary catheterization  . COPD (chronic obstructive pulmonary disease) ([place])   . DDD (degenerative disc disease)   . Depression   . Esophageal spasm    NTG and Norvasc  . Essential hypertension, benign   . Fibromyalgia   . Gastroparesis   . GERD (gastroesophageal reflux disease)   . History of cardiac catheterization    Normal coronary arteries 11/12  . History of pituitary tumor   . Irritable  bowel syndrome   . Lymphedema   . Osteoarthritis   . Sleep apnea    BIPAP  . Type 2 diabetes mellitus ([place])   . Urinary tract infection     MEDICATIONS:  Prior to Admission medications   Medication Sig Start Date End Date Taking? Authorizing Provider  albuterol (PROAIR HFA) 108 (90 BASE) MCG/ACT inhaler Inhale 2 puffs into the lungs every 6 (six) hours as needed for wheezing or shortness of breath.     Historical Provider, MD  albuterol (PROVENTIL) (2.5 MG/3ML) 0.083% nebulizer solution Take 2.5 mg by nebulization every 6 (six) hours as needed for wheezing. For shortness of breath.    Historical Provider, MD  atorvastatin (LIPITOR) 10 MG tablet Take 10 mg by mouth at bedtime. 03/12/15   Historical Provider, MD  baclofen (LIORESAL) 20 MG tablet Take 20 mg by mouth 3 (three) times daily.      Historical Provider, MD  buPROPion (WELLBUTRIN) 75 MG tablet Take 1 tablet (75 mg total) by mouth daily. 06/01/15 05/31/16  Cloria Spring, MD  diazepam (VALIUM) 5 MG tablet Take 5 mg by mouth every 12 (twelve) hours as needed for anxiety. For anxiety.    Historical Provider, MD  diclofenac sodium (VOLTAREN) 1 % GEL Apply 2-4 g topically 4 (four) times daily as needed. For pain    Historical Provider, MD  dicyclomine (BENTYL) 10 MG capsule Take 1 capsule (10 mg total) by mouth 4 (four) times daily -  before meals and at bedtime. For abdominal cramping and loose stools Patient taking differently: Take 10 mg by mouth 4 (four) times daily as needed. For abdominal cramping and loose stools 06/05/13   Annitta Needs, NP  diltiazem (CARDIZEM CD) 120 MG 24 hr capsule TAKE 1 CAPSULE BY MOUTH EVERY DAY 04/19/15   Satira Sark, MD  esomeprazole (NEXIUM) 40 MG packet Take 40 mg by mouth daily before breakfast. 08/16/15   Carlis Stable, NP  flecainide (TAMBOCOR) 50 MG tablet TAKE 2 TABLETS BY MOUTH TWICE DAILY 07/12/15   Satira Sark, MD  furosemide (LASIX) 80 MG tablet Take 80 mg by mouth 2 (two) times daily.       Historical Provider, MD  gabapentin (NEURONTIN) 600 MG tablet Take 600 mg by mouth 4 (four) times daily.    Historical Provider, MD  glimepiride (AMARYL) 2 MG tablet Take 2 mg by mouth 2 (two) times daily. 4mg  in the morning and 2mg  in the evening 05/21/15   Historical Provider, MD  hydrocortisone (PROCTOSOL HC) 2.5 % rectal cream Place 1 application rectally 2 (two) times daily. Patient taking differently: Place 1 application rectally daily as needed for hemorrhoids.  08/20/13   Annitta Needs, NP  isosorbide mononitrate (IMDUR) 120 MG 24 hr tablet Take 120 mg by mouth every morning.     Historical Provider, MD  isosorbide mononitrate (IMDUR) 30 MG 24 hr tablet Take 30 mg by mouth every evening.     Historical Provider, MD  linaclotide (LINZESS) 290 MCG CAPS capsule Take 290 mcg by mouth daily before breakfast.    Historical Provider, MD  lisinopril (PRINIVIL,ZESTRIL) 2.5 MG tablet Take 2.5 mg by mouth at bedtime.  10/29/12   Historical Provider, MD  loratadine (CLARITIN) 10 MG tablet Take 10 mg by mouth daily.      Historical Provider, MD  meclizine (ANTIVERT) 25 MG tablet Take 25 mg by mouth 3 (three) times daily as needed for dizziness.  07/31/13   Historical Provider, MD  metFORMIN (GLUCOPHAGE) 500 MG tablet Take 1,000 mg by mouth 2 (two) times daily.  05/18/13   Historical Provider, MD  nitroGLYCERIN (NITROLINGUAL) 0.4 MG/SPRAY spray PLACE 1 SPRAY UNDER THE TONGUE AS NEEDED. FOR CHEST PAIN 06/18/15   Satira Sark, MD  NON FORMULARY BIPAP - STARTED ABOUT 1 1/2 years AGO.    Historical Provider, MD  nystatin (MYCOSTATIN/NYSTOP) 100000 UNIT/GM POWD Apply 1 g topically daily.     Historical Provider, MD  ondansetron (ZOFRAN) 4 MG tablet Take 1 tablet (4 mg total) by mouth every 8 (eight) hours as needed for nausea or vomiting. 03/25/15   Carlis Stable, NP  OPANA ER, CRUSH RESISTANT, 20 MG T12A Take 1 tablet by mouth 2 (two) times daily. 04/10/12   Historical Provider, MD  oxyCODONE-acetaminophen  (PERCOCET) 10-325 MG per tablet Take 1 tablet by mouth every 6 (six) hours as needed. For pain.    Historical Provider, MD  polyethylene glycol powder (GLYCOLAX/MIRALAX) powder Take 17 g by mouth daily.  08/25/13   Historical Provider, MD  potassium chloride SA (K-DUR,KLOR-CON) 20 MEQ tablet Take 40 mEq by mouth 4 (four) times daily.      Historical Provider, MD  ranitidine (ZANTAC) 150 MG tablet Take 150 mg by mouth as needed.     Historical Provider, MD  Rivaroxaban (XARELTO) 20 MG TABS Take 20 mg by mouth daily.    Historical Provider, MD  spironolactone (ALDACTONE) 50 MG tablet Take 50 mg by  mouth 3 (three) times daily.      Historical Provider, MD    ALLERGIES:  Allergies  Allergen Reactions  . Hyoscyamine Sulfate Other (See Comments)    TACHYCARDIA  . Reglan [Metoclopramide] Other (See Comments)    tremors    SOCIAL HISTORY:  Social History  Substance Use Topics  . Smoking status: Former Smoker    Packs/day: 1.50    Years: 30.00    Types: Cigarettes    Start date: 03/06/1977    Quit date: 01/03/2007  . Smokeless tobacco: Never Used  . Alcohol use No    FAMILY HISTORY: Family History  Problem Relation Age of Onset  . Coronary artery disease Father     Died age 11  . Heart attack Father   . Arrhythmia Father     AF  . Diabetes Father   . Parkinson's disease Father   . Arrhythmia Mother     AF  . Stroke Mother   . Dementia Mother   . Cancer Mother     UTERINE   . Parkinson's disease Mother   . Arrhythmia Brother     AF  . Colon cancer Maternal Grandfather   . Colon cancer Paternal Aunt   . Colon cancer Paternal Aunt   . Diabetes Son   . Cancer Sister     BREAST   . Depression Sister   . Depression Sister   . Depression Sister     EXAM: BP 118/69   Pulse 69   Resp 15   SpO2 96%  Temp 99 F oral CONSTITUTIONAL: Alert and oriented and responds appropriately to questions. Well-appearing; well-nourished, obese HEAD: Normocephalic EYES: Conjunctivae clear,  PERRL ENT: normal nose; no rhinorrhea; moist mucous membranes NECK: Supple, no meningismus, no LAD  CARD: RRR; S1 and S2 appreciated; no murmurs, no clicks, no rubs, no gallops RESP: Normal chest excursion without splinting or tachypnea; breath sounds clear and equal bilaterally; no wheezes, no rhonchi, no rales, no hypoxia or respiratory distress, speaking full sentences ABD/GI: Normal bowel sounds; non-distended; soft, non-tender, no rebound, no guarding, no peritoneal signs BACK:  The back appears normal and is non-tender to palpation, there is no CVA tenderness EXT: Normal ROM in all joints; non-tender to palpation; 2+ pitting edema in bilateral lower extremities to the midcalf worse in the left side than the right; normal capillary refill; no cyanosis, no calf tenderness or swelling    SKIN: Normal color for age and race; warm; no rash NEURO: Moves all extremities equally, sensation to light touch intact diffusely, cranial nerves II through XII intact PSYCH: The patient's mood and manner are appropriate. Grooming and personal hygiene are appropriate.  MEDICAL DECISION MAKING: Patient here with chest pain, shortness of breath and lower extremity swelling. We'll obtain cardiac labs, chest x-ray. She is currently chest pain-free. Does appear volume overloaded. We'll give aspirin, IV Lasix. She does not have a recent echocardiogram in our system. I suspect she will need admission given she has multiple risk factors for ACS and has concerning story.  ED PROGRESS: Patient's labs are unremarkable including negative troponin. EKG shows no new ischemic abnormality.  D dimer negative.  BNP normal.  CXR clear.    2:30 AM  Discussed patient's case with hospitalist, Dr. Shanon Brow. She will see patient for admission and place orders.  I reviewed all nursing notes, vitals, pertinent old records, EKGs, labs, imaging (as available).      EKG Interpretation  Date/Time:  Wednesday September 15 2015 00:38:07  EDT Ventricular Rate:  71 PR Interval:    QRS Duration: 120 QT Interval:  428 QTC Calculation: 466 R Axis:   -38 Text Interpretation:  Sinus rhythm Prolonged PR interval Nonspecific IVCD with LAD Left ventricular hypertrophy Lateral infarct, old 2nd degree AV block has resolved Otherwise no significant change Confirmed by Keeva Reisen,  DO, Keylie Beavers [phone]) on 09/15/2015 2:14:27 AM         [place], DO 09/15/15 [id number]

## 2015-09-15 NOTE — ED Notes (Signed)
Patient on cardiac monitoring. 

## 2015-09-15 NOTE — ED Notes (Signed)
EKG done and given to Dr Ward 

## 2015-09-15 NOTE — Consult Note (Addendum)
Cardiology Consultation   Patient ID: [name] [name]; [id number]; 12-23-1960   Admit date: 09/15/2015 Date of Consult: 09/15/2015  Referring MD:  Dr. Posey Pronto Cardiologist:Dr. Domenic Polite Consulting Cardiologist: Dr. Bronson Ing  Patient Care Team: Glenda Chroman, MD as PCP - General (Internal Medicine) Daneil Dolin, MD as Consulting Physician (Gastroenterology)    Reason for Consultation: Chest pain   History of Present Illness: MACLE CRAM is a 55 y.o. female with a hx of  PAF maintain normal sinus rhythm on flecainide, diltiazem and Xarelto, history of recurrent chest pain with documented normal coronary arteries and low risk follow-up noninvasive cardiac evaluation. She continues on Imdur for possible endothelial dysfunction. Essential hypertension, COPD, fibromyalgia, chronic bilateral lower extremity lymphedema on Lasix and spironolactone.   Yesterday patient had progressive worsening of dyspnea on exertion. Has had worsening leg edema. Went to back doctor in [place] and weight was up 12 lbs. Hasn't changed diet but eats canned soup almost daily. Dyspnea worsened and then she developed chest tightness and some sharp shooting chest pain that lasted over 20 minutes associated with shortness of breath. It resolved spontaneously. Troponins negative 2, EKG without acute change. She had some chest tightness last night when bending over in bed relieved with morphine. She is on chronic morphine for back and leg pain.  Past Medical History:  Diagnosis Date  . Atrial fibrillation (HCC)    Paroxysmal  . Bladder dysfunction    Self urinary catheterization  . COPD (chronic obstructive pulmonary disease) ([place])   . DDD (degenerative disc disease)   . Depression   . Esophageal spasm    NTG and Norvasc  . Essential hypertension, benign   . Fibromyalgia   . Gastroparesis   . GERD (gastroesophageal reflux disease)   . History of cardiac catheterization    Normal coronary arteries 11/12    . History of pituitary tumor   . Irritable bowel syndrome   . Lymphedema   . Osteoarthritis   . Sleep apnea    BIPAP  . Type 2 diabetes mellitus ([place])   . Urinary tract infection     Past Surgical History:  Procedure Laterality Date  . ABDOMINAL HYSTERECTOMY    . ANTERIOR CERVICAL DECOMP/DISCECTOMY FUSION N/A 12/02/2014   Procedure: Cervical five cervical six anterior cervical decompression with fusion interbody prosthesis plating and bone graft;  Surgeon: Newman Pies, MD;  Location: [place] [facility];  Service: Neurosurgery;  Laterality: N/A;  C56 anterior cervical decompression with fusion interbody prosthesis plating and bonegraft  . APPENDECTOMY    . BACK SURGERY    . CARDIAC CATHETERIZATION    . CARDIOVERSION N/A 03/26/2014   Procedure: CARDIOVERSION;  Surgeon: Herminio Commons, MD;  Location: [facility];  Service: Endoscopy;  Laterality: N/A;  . CARDIOVERSION N/A 05/04/2014   Procedure: CARDIOVERSION;  Surgeon: Satira Sark, MD;  Location: [facility];  Service: Cardiovascular;  Laterality: N/A;  . CHOLECYSTECTOMY    . COLONOSCOPY     Approximately 2003. Per medical records, internal hemorrhoids noted  . COLONOSCOPY N/A 06/25/2013   Dr. Gala Romney: Anal canal hemorrhoids-more likely the source of paper hematochezia. Redundant, capacious colon. Multiple colonic polyps-tubular adenoma  . ESOPHAGOGASTRODUODENOSCOPY  10/12/2004   [id number] plaquing on the esophageal mucosa of uncertain significance, not typical of what is seen with candida esophagitis status post KOH brushing for KOH prep and biopsy for histology. Rule out candida esophagitis/eosinophilic esophagitis. Otherwise normal esophagus. Tiny hiatal hernia. Otherwise, normal stomach, normal D1 and D2. Benign biopsy of esophagus,  unknown KOH status.   . ESOPHAGOGASTRODUODENOSCOPY N/A 06/25/2013   Dr. Rourk:mild chronic gastritis  . KNEE SURGERY    . LEFT HEART CATHETERIZATION WITH CORONARY ANGIOGRAM N/A 11/15/2010   Procedure: LEFT  HEART CATHETERIZATION WITH CORONARY ANGIOGRAM;  Surgeon: Laverda Page, MD;  Location[facility] CATH LAB;  Service: Cardiovascular;  Laterality: N/A;  . LUNG BIOPSY        Home Meds: Prior to Admission medications   Medication Sig Start Date End Date Taking? Authorizing Provider  albuterol (PROAIR HFA) 108 (90 BASE) MCG/ACT inhaler Inhale 2 puffs into the lungs every 6 (six) hours as needed for wheezing or shortness of breath.     Historical Provider, MD  albuterol (PROVENTIL) (2.5 MG/3ML) 0.083% nebulizer solution Take 2.5 mg by nebulization every 6 (six) hours as needed for wheezing. For shortness of breath.    Historical Provider, MD  atorvastatin (LIPITOR) 10 MG tablet Take 10 mg by mouth at bedtime. 03/12/15   Historical Provider, MD  baclofen (LIORESAL) 20 MG tablet Take 20 mg by mouth 3 (three) times daily.      Historical Provider, MD  buPROPion (WELLBUTRIN) 75 MG tablet Take 1 tablet (75 mg total) by mouth daily. 06/01/15 05/31/16  Cloria Spring, MD  diazepam (VALIUM) 5 MG tablet Take 5 mg by mouth every 12 (twelve) hours as needed for anxiety. For anxiety.    Historical Provider, MD  diclofenac sodium (VOLTAREN) 1 % GEL Apply 2-4 g topically 4 (four) times daily as needed. For pain    Historical Provider, MD  dicyclomine (BENTYL) 10 MG capsule Take 1 capsule (10 mg total) by mouth 4 (four) times daily -  before meals and at bedtime. For abdominal cramping and loose stools Patient taking differently: Take 10 mg by mouth 4 (four) times daily as needed. For abdominal cramping and loose stools 06/05/13   Annitta Needs, NP  diltiazem (CARDIZEM CD) 120 MG 24 hr capsule TAKE 1 CAPSULE BY MOUTH EVERY DAY 04/19/15   Satira Sark, MD  esomeprazole (NEXIUM) 40 MG packet Take 40 mg by mouth daily before breakfast. 08/16/15   Carlis Stable, NP  flecainide (TAMBOCOR) 50 MG tablet TAKE 2 TABLETS BY MOUTH TWICE DAILY 07/12/15   Satira Sark, MD  furosemide (LASIX) 80 MG tablet Take 80 mg by mouth 2 (two)  times daily.      Historical Provider, MD  gabapentin (NEURONTIN) 600 MG tablet Take 600 mg by mouth 4 (four) times daily.    Historical Provider, MD  glimepiride (AMARYL) 2 MG tablet Take 2 mg by mouth 2 (two) times daily. 4mg  in the morning and 2mg  in the evening 05/21/15   Historical Provider, MD  hydrocortisone (PROCTOSOL HC) 2.5 % rectal cream Place 1 application rectally 2 (two) times daily. Patient taking differently: Place 1 application rectally daily as needed for hemorrhoids.  08/20/13   Annitta Needs, NP  isosorbide mononitrate (IMDUR) 120 MG 24 hr tablet Take 120 mg by mouth every morning.     Historical Provider, MD  isosorbide mononitrate (IMDUR) 30 MG 24 hr tablet Take 30 mg by mouth every evening.     Historical Provider, MD  linaclotide (LINZESS) 290 MCG CAPS capsule Take 290 mcg by mouth daily before breakfast.    Historical Provider, MD  lisinopril (PRINIVIL,ZESTRIL) 2.5 MG tablet Take 2.5 mg by mouth at bedtime.  10/29/12   Historical Provider, MD  loratadine (CLARITIN) 10 MG tablet Take 10 mg by mouth daily.  Historical Provider, MD  meclizine (ANTIVERT) 25 MG tablet Take 25 mg by mouth 3 (three) times daily as needed for dizziness.  07/31/13   Historical Provider, MD  metFORMIN (GLUCOPHAGE) 500 MG tablet Take 1,000 mg by mouth 2 (two) times daily.  05/18/13   Historical Provider, MD  nitroGLYCERIN (NITROLINGUAL) 0.4 MG/SPRAY spray PLACE 1 SPRAY UNDER THE TONGUE AS NEEDED. FOR CHEST PAIN 06/18/15   Satira Sark, MD  NON FORMULARY BIPAP - STARTED ABOUT 1 1/2 years AGO.    Historical Provider, MD  nystatin (MYCOSTATIN/NYSTOP) 100000 UNIT/GM POWD Apply 1 g topically daily.     Historical Provider, MD  ondansetron (ZOFRAN) 4 MG tablet Take 1 tablet (4 mg total) by mouth every 8 (eight) hours as needed for nausea or vomiting. 03/25/15   Carlis Stable, NP  OPANA ER, CRUSH RESISTANT, 20 MG T12A Take 1 tablet by mouth 2 (two) times daily. 04/10/12   Historical Provider, MD    oxyCODONE-acetaminophen (PERCOCET) 10-325 MG per tablet Take 1 tablet by mouth every 6 (six) hours as needed. For pain.    Historical Provider, MD  polyethylene glycol powder (GLYCOLAX/MIRALAX) powder Take 17 g by mouth daily.  08/25/13   Historical Provider, MD  potassium chloride SA (K-DUR,KLOR-CON) 20 MEQ tablet Take 40 mEq by mouth 4 (four) times daily.      Historical Provider, MD  ranitidine (ZANTAC) 150 MG tablet Take 150 mg by mouth as needed.     Historical Provider, MD  Rivaroxaban (XARELTO) 20 MG TABS Take 20 mg by mouth daily.    Historical Provider, MD  spironolactone (ALDACTONE) 50 MG tablet Take 50 mg by mouth 3 (three) times daily.      Historical Provider, MD    Current Medications: . aspirin      . aspirin EC  325 mg Oral Daily  . furosemide      . furosemide  80 mg Oral BID  . morphine  60 mg Oral Q12H  . rivaroxaban  20 mg Oral Daily  . spironolactone  50 mg Oral TID     Allergies:    Allergies  Allergen Reactions  . Hyoscyamine Sulfate Other (See Comments)    TACHYCARDIA  . Reglan [Metoclopramide] Other (See Comments)    tremors    Social History:   The patient  reports that she quit smoking about 8 years ago. Her smoking use included Cigarettes. She started smoking about 38 years ago. She has a 45.00 pack-year smoking history. She has never used smokeless tobacco. She reports that she does not drink alcohol or use drugs.    Family History:   The patient's family history includes Arrhythmia in her brother, father, and mother; Cancer in her mother and sister; Colon cancer in her maternal grandfather, paternal aunt, and paternal aunt; Coronary artery disease in her father; Dementia in her mother; Depression in her sister, sister, and sister; Diabetes in her father and son; Heart attack in her father; Parkinson's disease in her father and mother; Stroke in her mother.   ROS:  Please see the history of present illness.  Review of Systems  Constitution: Negative.   HENT: Negative.   Eyes: Negative.   Cardiovascular: Positive for dyspnea on exertion and leg swelling.  Respiratory: Positive for shortness of breath.   Hematologic/Lymphatic: Negative.   Musculoskeletal: Positive for back pain, muscle weakness and myalgias. Negative for joint pain.  Gastrointestinal: Negative.   Genitourinary: Positive for incomplete emptying.       In  and out caths herself  Neurological: Negative.    All other ROS reviewed and negative.      Vital Signs: Blood pressure (!) 116/54, pulse 66, temperature 97.7 F (36.5 C), temperature source Oral, resp. rate 16, weight (!) 307 lb 14.4 oz (139.7 kg), SpO2 98 %.   PHYSICAL EXAM: General:  Obese, in no acute distress  HEENT: normal Lymph: no adenopathy Neck: no JVD Endocrine:  No thryomegaly Vascular: No carotid bruits; FA pulses 2+ bilaterally without bruits  Cardiac:  RRR; normal S1, S2; 2/6 systolic murmur LSB,no rub, bruit, thrill, or heave Lungs:  Decreased breath sounds but clear to auscultation bilaterally, no wheezing, rhonchi or rales  Abd: soft, nontender, no hepatomegaly  Ext: no edema, Good distal pulses bilaterally Musculoskeletal:  No deformities, BUE and BLE strength normal and equal Skin: warm and dry  Neuro:  CNs 2-12 intact, no focal abnormalities noted Psych:  Normal affect    EKG:  Normal sinus rhythm with LVH, no acute change  Telemetry: NSR  Labs:  Recent Labs  09/15/15 0200 09/15/15 0435  TROPONINI <0.03 <0.03   Lab Results  Component Value Date   WBC 8.7 09/15/2015   HGB 11.9 (L) 09/15/2015   HCT 35.5 (L) 09/15/2015   MCV 87.4 09/15/2015   PLT 273 09/15/2015    Recent Labs Lab 09/15/15 0200  NA 135  K 3.5  CL 96*  CO2 26  BUN 8  CREATININE 0.60  CALCIUM 9.6  GLUCOSE 283*   No results found for: CHOL, HDL, LDLCALC, TRIG Lab Results  Component Value Date   DDIMER 0.36 09/15/2015    Radiology/Studies:  Dg Chest 2 View  Result Date: 09/15/2015 CLINICAL  DATA:  Chest pain, shortness of breath and fatigue for 12 hours. Lower extremity swelling for 4 days. EXAM: CHEST  2 VIEW COMPARISON:  Radiographs 01/06/2013 FINDINGS: The cardiomediastinal contours are normal. Again seen hyperinflation and chronic bronchitic changes. Pulmonary vasculature is normal. No consolidation, pleural effusion, or pneumothorax. No acute osseous abnormalities are seen. IMPRESSION: Chronic hyperinflation and bronchitic change.  No acute abnormality. Electronically Signed   By: Jeb Levering M.D.   On: 09/15/2015 02:27   Dg Abd 2 Views  Result Date: 08/16/2015 CLINICAL DATA:  Constipation for 6 months.  Recent diarrhea. EXAM: ABDOMEN - 2 VIEW COMPARISON:  CT from 12/29/2014 FINDINGS: Again noted are pedicle screws and rods in the lower lumbar spine and sacrum. No evidence for free air on the upright view. Nonobstructive bowel gas pattern. No significant stool burden in the abdomen or pelvis. Cholecystectomy clips. IMPRESSION: No acute abnormality. Electronically Signed   By: Markus Daft M.D.   On: 08/16/2015 15:56   Cardiac catheterization 11/15/2010 ANGIOGRAPHIC DATA:  Left ventricle:  Left ventricular systolic function was normal with ejection fraction of 60% to 65%.   Right coronary artery:  Right coronary artery is a large-caliber vessel and is a dominant vessel.  It is smooth and normal.   Left main coronary artery:  Left main coronary artery is a large-caliber vessel.  It is smooth and normal.   Left anterior descending artery.  Left anterior descending artery is a large-caliber vessel, which gives origin to several small to moderate- sized diagonals.  It wraps around the apex.  It is smooth and normal.   Circumflex:  The circumflex is a very large vessel giving origin to large obtuse marginal 1.  It is smooth and normal.   IMPRESSION: 1. Normal left ventricular systolic function.  Ejection  fraction 65%.     No significant mitral regurgitation. 2. Mildly  elevated left ventricular end-diastolic pressure suggestive     of diastolic dysfunction. 3. Normal coronary arteries, right dominant circulation.   RECOMMENDATION:  Evaluation for noncardiac cause of chest pain is indicated.  I suspect GI as an etiology for her chest pain.  She will be discharged home today with outpatient followup.  A total of 60 mL of contrast was utilized for diagnostic angiography.      PROBLEM LIST:  Principal Problem:   Chest pain Active Problems:   Obstructive sleep apnea   Gastroparesis due to DM (HCC)   Paroxysmal atrial fibrillation (HCC)   Essential hypertension, benign   GERD (gastroesophageal reflux disease)   Fibromyalgia   Chronic obstructive pulmonary disease (HCC)     ASSESSMENT AND PLAN:  Acute on chronic diastolic CHF: negative 2.4 L and weight down 12 lbs?? Suspect contributing to her chest pain. Await 2Decho  Chest pain with negative troponins and EKG's without change. Normal coronaries in 2012, has been on Imdur for possible endothelial dysfunction. Could do stress test as outpatient if continues to have pain. Would resume home meds.  PAF maintaining NSR on Flecainide, diltiazem, Xarelto-all home meds need resumed here in hospital  Chronic lower extremity lymphedema  OSA  HTN  COPD  Fibromyalgia   Signed, Ermalinda Barrios, PA-C  09/15/2015 8:16 AM   The patient was seen and examined, and I agree with the history, physical exam, assessment and plan as documented above, with modifications as noted below. Pt with PAF followed by Dr. Domenic Polite admitted with worsening SOB and chest pain. Has chronic lymphedema, neuropathy, and chronic venous insufficiency. Troponins and BNP normal. Chest xray clear. Chest pain is chronic and atypical for a cardiac etiology. Would not pursue further inpatient workup. Will follow up echocardiogram. She may have some degree of diastolic dysfunction given obesity and OSA, but I am not convinced this is an  acute diastolic decompensation at this point.  Kate Sable, MD, [facility]  09/15/2015 10:06 AM

## 2015-09-15 NOTE — Progress Notes (Signed)
*  PRELIMINARY RESULTS* Echocardiogram 2D Echocardiogram has been performed.  Leavy Cella 09/15/2015, 11:46 AM

## 2015-09-16 ENCOUNTER — Telehealth: Payer: Self-pay

## 2015-09-16 DIAGNOSIS — K5900 Constipation, unspecified: Secondary | ICD-10-CM

## 2015-09-16 DIAGNOSIS — K219 Gastro-esophageal reflux disease without esophagitis: Secondary | ICD-10-CM

## 2015-09-16 LAB — URINE CULTURE

## 2015-09-16 NOTE — Telephone Encounter (Signed)
Pt called- she said she needed an rx for brand only nexium sent in to [facility]. Pt had a new rx sent in( in Aug) for generic nexium. Per [facility] ok to change to brand only. I have already done a PA for this and it said it was approved. I called [name] and asked them to change to brand only and if it was a problem to let me know.

## 2015-09-16 NOTE — Discharge Summary (Signed)
[facility] Discharge Summary   Patient: [name] [name] [id number]   PCP: Glenda Chroman, MD DOB: [birth year]   Date of admission: 09/15/2015   Date of discharge: 09/15/2015     Discharge Diagnoses:  Principal Problem:   Chest pain Active Problems:   Obstructive sleep apnea   Gastroparesis due to DM (HCC)   Paroxysmal atrial fibrillation (HCC)   Essential hypertension, benign   GERD (gastroesophageal reflux disease)   Fibromyalgia   Chronic obstructive pulmonary disease ([place])   Admitted From: Home Disposition:  Home  Recommendations for Outpatient Follow-up:  1. Please follow-up with PCP in one week. 2. Follow-up with cardiology as scheduled and call as needed.   Follow-up Information    Glenda Chroman, MD. Schedule an appointment as soon as possible for a visit in 1 week(s).   Specialty:  Internal Medicine Contact information: [place] [address] [phone]        Rozann Lesches, MD. Call in 3 week(s).   Specialty:  Cardiology Why:  as needed Contact information: [address] [name]  [address] [phone]          Diet recommendation: Cardiac diet  Activity: The patient is advised to gradually reintroduce usual activities.  Discharge Condition: good  Code Status: Full code  History of present illness: As per the H and P dictated on admission, "[name] [name] is a 55 y.o. female with medical history significant of chronic ble lymphedema for over 2 years, afib on xaralto, COPD, MO, fibromyalgia comes in with sscp that radiated to left chest today that lasted over 20 minutes and was associated with sob.  She denies any cough or fevers.  Pain spontaneously resolved.  Pain was exertional.  She has had worsened ble swelling the last 2 days.  She is on xaralto, and lasix and spirinolactone and reports she takes all her meds.  Pt referred for admission for possible ACS.  She has no h/o CHF."  Hospital Course:  Summary of her active  problems in the hospital is as following.  Principal Problem: Musculoskeletal Chest pain Patient presented with complains of chest pain. The pain was reproducible as well as worsening with movement and deep breath. Improved with Robaxin. Troponin 3 negative, echocardiogram does not show any wall motion abnormality ejection fraction 60-65%. Cardiology recommended no inpatient workup. Patient will continue to follow-up with cardiology as scheduled.  Active Problems:   Type 2 diabetes mellitus. Continuing home medication.    Obstructive sleep apnea Continuous CPAP daily at bedtime.    Gastroparesis due to DM [facility]) Continue home regimen.    Paroxysmal atrial fibrillation (HCC) Continue anticoagulation at home regimen.    Essential hypertension, benign Continue home blood pressure medication.    Fibromyalgia Continue home regimen. Robaxin added for only 5 days.    Chronic obstructive pulmonary disease (HCC)  Continue inhalers.  All other chronic medical condition were stable during the hospitalization.  Patient was ambulatory without any assistance. On the day of the discharge the patient's vitals were stable, and no other acute medical condition were reported by patient. the patient was felt safe to be discharge at home with family.  Procedures and Results:  Echocardiogram  Study Conclusions  - Left ventricle: The cavity size was normal. Systolic function was   normal. The estimated ejection fraction was in the range of 60%   to 65%. Wall motion was normal; there were no regional wall   motion abnormalities. Doppler parameters  are consistent with   abnormal left ventricular relaxation (grade 1 diastolic   dysfunction). Mild concentric and moderate focal basal septal   hypertrophy.  Consultations:  Cardiology  DISCHARGE MEDICATION: Discharge Medication List as of 09/15/2015  3:18 PM    START taking these medications   Details  aspirin EC 81 MG tablet Take 1  tablet (81 mg total) by mouth daily., Starting Thu 09/16/2015, Normal    methocarbamol (ROBAXIN) 500 MG tablet Take 1 tablet (500 mg total) by mouth every 8 (eight) hours as needed for muscle spasms., Starting Wed 09/15/2015, Normal      CONTINUE these medications which have NOT CHANGED   Details  acidophilus (RISAQUAD) CAPS capsule Take 1 capsule by mouth daily., Historical Med    albuterol (PROAIR HFA) 108 (90 BASE) MCG/ACT inhaler Inhale 2 puffs into the lungs every 6 (six) hours as needed for wheezing or shortness of breath. , Until Discontinued, Historical Med    albuterol (PROVENTIL) (2.5 MG/3ML) 0.083% nebulizer solution Take 2.5 mg by nebulization every 6 (six) hours as needed for wheezing. For shortness of breath., Until Discontinued, Historical Med    atorvastatin (LIPITOR) 10 MG tablet Take 10 mg by mouth at bedtime., Starting Fri 03/12/2015, Historical Med    baclofen (LIORESAL) 20 MG tablet Take 20 mg by mouth 3 (three) times daily.  , Until Discontinued, Historical Med    buPROPion (WELLBUTRIN) 75 MG tablet Take 1 tablet (75 mg total) by mouth daily., Starting Tue 06/01/2015, Until Wed 05/31/2016, Normal    Cranberry 250 MG CAPS Take 1 capsule by mouth daily., Historical Med    diazepam (VALIUM) 5 MG tablet Take 5 mg by mouth every 12 (twelve) hours as needed for anxiety. For anxiety., Until Discontinued, Historical Med    diclofenac sodium (VOLTAREN) 1 % GEL Apply 2-4 g topically 4 (four) times daily as needed. For pain, Until Discontinued, Historical Med    dicyclomine (BENTYL) 10 MG capsule Take 1 capsule (10 mg total) by mouth 4 (four) times daily -  before meals and at bedtime. For abdominal cramping and loose stools, Starting 06/05/2013, Until Discontinued, Normal    diltiazem (CARDIZEM CD) 120 MG 24 hr capsule TAKE 1 CAPSULE BY MOUTH EVERY DAY, Normal    docusate sodium (COLACE) 100 MG capsule Take 100 mg by mouth 2 (two) times daily., Historical Med    esomeprazole  (NEXIUM) 40 MG packet Take 40 mg by mouth daily before breakfast., Starting Mon 08/16/2015, Normal    flecainide (TAMBOCOR) 50 MG tablet TAKE 2 TABLETS BY MOUTH TWICE DAILY, Normal    furosemide (LASIX) 80 MG tablet Take 80 mg by mouth 2 (two) times daily.  , Until Discontinued, Historical Med    gabapentin (NEURONTIN) 600 MG tablet Take 600 mg by mouth 4 (four) times daily., Until Discontinued, Historical Med    glimepiride (AMARYL) 2 MG tablet Take 2-4 mg by mouth 2 (two) times daily. 4mg  in the morning and 2mg  in the evening, Starting Fri 05/21/2015, Historical Med    hydrocortisone (PROCTOSOL HC) 2.5 % rectal cream Place 1 application rectally 2 (two) times daily., Starting 08/20/2013, Until Discontinued, Normal    !! isosorbide mononitrate (IMDUR) 120 MG 24 hr tablet Take 120 mg by mouth every morning. , Until Discontinued, Historical Med    !! isosorbide mononitrate (IMDUR) 30 MG 24 hr tablet Take 30 mg by mouth every evening. , Until Discontinued, Historical Med    linaclotide (LINZESS) 290 MCG CAPS capsule Take 290 mcg by  mouth daily before breakfast., Historical Med    lisinopril (PRINIVIL,ZESTRIL) 2.5 MG tablet Take 2.5 mg by mouth at bedtime. , Starting 10/29/2012, Until Discontinued, Historical Med    loratadine (CLARITIN) 10 MG tablet Take 10 mg by mouth daily.  , Until Discontinued, Historical Med    meclizine (ANTIVERT) 25 MG tablet Take 25 mg by mouth 3 (three) times daily as needed for dizziness. , Starting Thu 07/31/2013, Historical Med    metFORMIN (GLUCOPHAGE) 500 MG tablet Take 1,000 mg by mouth 2 (two) times daily. , Starting Sun 05/18/2013, Historical Med    !! morphine (MS CONTIN) 15 MG 12 hr tablet Take 1 tablet by mouth 3 (three) times daily. Takes with the 15 mg., Starting Thu 09/09/2015, Historical Med    !! morphine (MS CONTIN) 30 MG 12 hr tablet Take 1 tablet by mouth 3 (three) times daily. Takes with 15 mg tablet., Starting Thu 09/09/2015, Historical Med      Multiple Vitamin (MULTIVITAMIN WITH MINERALS) TABS tablet Take 1 tablet by mouth daily., Historical Med    nitroGLYCERIN (NITROLINGUAL) 0.4 MG/SPRAY spray PLACE 1 SPRAY UNDER THE TONGUE AS NEEDED. FOR CHEST PAIN, Normal    NON FORMULARY BIPAP - STARTED ABOUT 1 1/2 years AGO., Until Discontinued, Historical Med    nystatin (MYCOSTATIN/NYSTOP) 100000 UNIT/GM POWD Apply 1 g topically daily. , Until Discontinued, Historical Med    omega-3 acid ethyl esters (LOVAZA) 1 g capsule Take 1 capsule by mouth 4 (four) times daily., Starting Mon 09/13/2015, Historical Med    oxyCODONE-acetaminophen (PERCOCET) 10-325 MG per tablet Take 1 tablet by mouth every 6 (six) hours as needed. For pain., Until Discontinued, Historical Med    polyethylene glycol powder (GLYCOLAX/MIRALAX) powder Take 17 g by mouth daily. , Starting Mon 08/25/2013, Historical Med    potassium chloride SA (K-DUR,KLOR-CON) 20 MEQ tablet Take 40 mEq by mouth 4 (four) times daily.  , Until Discontinued, Historical Med    PRESCRIPTION MEDICATION Apply 1 application topically daily as needed (pain). COMPOUNDED MEDICATION FROM [name]. -- C-DOXEPIN-CYCLOBEN-GABAPENT-IBUPROFEN-LIDO 1.9-2-2-2-2.25% Grams, Historical Med    ranitidine (ZANTAC) 150 MG tablet Take 150 mg by mouth daily as needed for heartburn. , Historical Med    Rivaroxaban (XARELTO) 20 MG TABS Take 20 mg by mouth every evening. , Historical Med    spironolactone (ALDACTONE) 50 MG tablet Take 50 mg by mouth 3 (three) times daily.  , Until Discontinued, Historical Med    ondansetron (ZOFRAN) 4 MG tablet Take 1 tablet (4 mg total) by mouth every 8 (eight) hours as needed for nausea or vomiting., Starting Thu 03/25/2015, Normal     !! - Potential duplicate medications found. Please discuss with provider.     Allergies  Allergen Reactions  . Hyoscyamine Sulfate Other (See Comments)    TACHYCARDIA  . Reglan [Metoclopramide] Other (See Comments)    tremors   Discharge  Instructions    Diet - low sodium heart healthy    Complete by:  As directed    Diet Carb Modified    Complete by:  As directed    Discharge instructions    Complete by:  As directed    It is important that you read following instructions as well as go over your medication list with RN to help you understand your care after this hospitalization.  Discharge Instructions: Please follow-up with PCP in one week  Please request your primary care physician to go over all Hospital Tests and Procedure/Radiological results at the follow up,  Please get all Hospital records sent to your PCP by signing hospital release before you go home.   Do not drive, operating heavy machinery, perform activities at heights, swimming or participation in water activities or provide baby sitting services; until you have been seen by Primary Care Physician or a Neurologist and advised to do so again. Do not take more than prescribed Pain, Sleep and Anxiety Medications. You were cared for by a hospitalist during your hospital stay. If you have any questions about your discharge medications or the care you received while you were in the hospital after you are discharged, you can call the unit and ask to speak with the hospitalist on call if the hospitalist that took care of you is not available.  Once you are discharged, your primary care physician will handle any further medical issues. Please note that NO REFILLS for any discharge medications will be authorized once you are discharged, as it is imperative that you return to your primary care physician (or establish a relationship with a primary care physician if you do not have one) for your aftercare needs so that they can reassess your need for medications and monitor your lab values. You Must read complete instructions/literature along with all the possible adverse reactions/side effects for all the Medicines you take and that have been prescribed to you. Take any new  Medicines after you have completely understood and accept all the possible adverse reactions/side effects. Wear Seat belts while driving. If you have smoked or chewed Tobacco in the last 2 yrs please stop smoking and/or stop any Recreational drug use.   Increase activity slowly    Complete by:  As directed      Discharge Exam: Filed Weights   09/15/15 0449 09/15/15 0930  Weight: (!) 139.7 kg (307 lb 14.4 oz) (!) 140.6 kg (310 lb)   Vitals:   09/15/15 0300 09/15/15 0449  BP: 125/56 (!) 116/54  Pulse: 65 66  Resp: 14 16  Temp:  97.7 F (36.5 C)   General: Appear in no distress, no Rash; Oral Mucosa moist. Cardiovascular: S1 and S2 Present, no Murmur, no JVD Respiratory: Bilateral Air entry present and Clear to Auscultation, no Crackles, no wheezes Abdomen: Bowel Sound present, Soft and no tenderness Extremities: no Pedal edema, no calf tenderness Neurology: Grossly no focal neuro deficit.  The results of significant diagnostics from this hospitalization (including imaging, microbiology, ancillary and laboratory) are listed below for reference.    Significant Diagnostic Studies: Dg Chest 2 View  Result Date: 09/15/2015 CLINICAL DATA:  Chest pain, shortness of breath and fatigue for 12 hours. Lower extremity swelling for 4 days. EXAM: CHEST  2 VIEW COMPARISON:  Radiographs 01/06/2013 FINDINGS: The cardiomediastinal contours are normal. Again seen hyperinflation and chronic bronchitic changes. Pulmonary vasculature is normal. No consolidation, pleural effusion, or pneumothorax. No acute osseous abnormalities are seen. IMPRESSION: Chronic hyperinflation and bronchitic change.  No acute abnormality. Electronically Signed   By: Jeb Levering M.D.   On: 09/15/2015 02:27    Microbiology: Recent Results (from the past 240 hour(s))  Urine culture     Status: Abnormal   Collection Time: 09/15/15  3:15 AM  Result Value Ref Range Status   Specimen Description URINE, CLEAN CATCH  Final    Special Requests NONE  Final   Culture (A)  Final    <10,000 COLONIES/mL INSIGNIFICANT GROWTH Performed at [facility]    Report Status 09/16/2015 FINAL  Final     Labs: CBC:  Recent Labs Lab 09/15/15 0200  WBC 8.7  NEUTROABS 4.1  HGB 11.9*  HCT 35.5*  MCV 87.4  PLT 123456   Basic Metabolic Panel:  Recent Labs Lab 09/15/15 0200  NA 135  K 3.5  CL 96*  CO2 26  GLUCOSE 283*  BUN 8  CREATININE 0.60  CALCIUM 9.6   Liver Function Tests: No results for input(s): AST, ALT, ALKPHOS, BILITOT, PROT, ALBUMIN in the last 168 hours. No results for input(s): LIPASE, AMYLASE in the last 168 hours. No results for input(s): AMMONIA in the last 168 hours. Cardiac Enzymes:  Recent Labs Lab 09/15/15 0200 09/15/15 0435 09/15/15 0739 09/15/15 1033  TROPONINI <0.03 <0.03 <0.03 <0.03   BNP (last 3 results)  Recent Labs  09/15/15 0200  BNP 40.0   CBG:  Recent Labs Lab 09/15/15 0031 09/15/15 0349  GLUCAP 285* 257*   Time spent: 30 minutes  Signed:  Zakira Ressel  [facility] 09/15/2015   , 11:27 AM

## 2015-09-20 NOTE — Telephone Encounter (Signed)
[name] [name] from [facility] called- because the pt has Medicare-  they need a written rx or one sent electronically that says "brand only" on it. Please print or send electronically.

## 2015-09-21 DIAGNOSIS — Z299 Encounter for prophylactic measures, unspecified: Secondary | ICD-10-CM | POA: Diagnosis not present

## 2015-09-21 DIAGNOSIS — E1140 Type 2 diabetes mellitus with diabetic neuropathy, unspecified: Secondary | ICD-10-CM | POA: Diagnosis not present

## 2015-09-21 DIAGNOSIS — R0600 Dyspnea, unspecified: Secondary | ICD-10-CM | POA: Diagnosis not present

## 2015-09-21 DIAGNOSIS — Z90710 Acquired absence of both cervix and uterus: Secondary | ICD-10-CM | POA: Diagnosis not present

## 2015-09-21 NOTE — Telephone Encounter (Signed)
Spoke with Barnetta Chapel at [facility], she said the pt is getting capsules.

## 2015-09-21 NOTE — Telephone Encounter (Signed)
Please ask the patient if she takes the capsules or packets. We have the packets currently and not sure why? Want to make sure she gets the correct form then I can send in the brand only Rx

## 2015-09-21 NOTE — Telephone Encounter (Signed)
Called pharmacy and left a message for them to call me back with this information.

## 2015-09-22 DIAGNOSIS — J309 Allergic rhinitis, unspecified: Secondary | ICD-10-CM | POA: Diagnosis not present

## 2015-09-22 DIAGNOSIS — I480 Paroxysmal atrial fibrillation: Secondary | ICD-10-CM | POA: Diagnosis not present

## 2015-09-22 DIAGNOSIS — J410 Simple chronic bronchitis: Secondary | ICD-10-CM | POA: Diagnosis not present

## 2015-09-22 DIAGNOSIS — Z23 Encounter for immunization: Secondary | ICD-10-CM | POA: Diagnosis not present

## 2015-09-22 DIAGNOSIS — G4733 Obstructive sleep apnea (adult) (pediatric): Secondary | ICD-10-CM | POA: Diagnosis not present

## 2015-09-22 DIAGNOSIS — R591 Generalized enlarged lymph nodes: Secondary | ICD-10-CM | POA: Diagnosis not present

## 2015-09-22 DIAGNOSIS — R600 Localized edema: Secondary | ICD-10-CM | POA: Diagnosis not present

## 2015-09-23 DIAGNOSIS — L84 Corns and callosities: Secondary | ICD-10-CM | POA: Diagnosis not present

## 2015-09-23 DIAGNOSIS — E1142 Type 2 diabetes mellitus with diabetic polyneuropathy: Secondary | ICD-10-CM | POA: Diagnosis not present

## 2015-09-23 DIAGNOSIS — B351 Tinea unguium: Secondary | ICD-10-CM | POA: Diagnosis not present

## 2015-09-23 MED ORDER — NEXIUM 40 MG PO CPDR
40.0000 mg | DELAYED_RELEASE_CAPSULE | Freq: Every day | ORAL | 5 refills | Status: DC
Start: 2015-09-23 — End: 2016-02-28

## 2015-09-23 NOTE — Telephone Encounter (Signed)
Rx for name brand only (DAW) sent to pharmacy

## 2015-09-24 ENCOUNTER — Ambulatory Visit (INDEPENDENT_AMBULATORY_CARE_PROVIDER_SITE_OTHER): Payer: Medicare Other | Admitting: Internal Medicine

## 2015-09-24 ENCOUNTER — Encounter: Payer: Self-pay | Admitting: Internal Medicine

## 2015-09-24 VITALS — BP 111/56 | HR 71 | Temp 97.6°F | Ht 69.0 in | Wt 312.2 lb

## 2015-09-24 DIAGNOSIS — K219 Gastro-esophageal reflux disease without esophagitis: Secondary | ICD-10-CM

## 2015-09-24 DIAGNOSIS — Z86010 Personal history of colonic polyps: Secondary | ICD-10-CM

## 2015-09-24 DIAGNOSIS — K5909 Other constipation: Secondary | ICD-10-CM

## 2015-09-24 DIAGNOSIS — I208 Other forms of angina pectoris: Secondary | ICD-10-CM | POA: Diagnosis not present

## 2015-09-24 NOTE — Patient Instructions (Signed)
Information on constipation and GERD  Continue Linzess and Miralax daily  Any decrease in opioids will help GERD and constipation  Would stop Colace for now  Office visit in 3 months

## 2015-09-24 NOTE — Progress Notes (Signed)
Primary Care Physician:  Glenda Chroman, MD Primary Gastroenterologist:  Dr. Gala Romney  Pre-Procedure History & Physical: HPI:  [name] [name] is a 56 y.o. female here for follow-up of constipation, GERD and gastroparesis. Being switched from generic to proprietary Nexium 40 mg daily as she feels this is most effective in managing GERD. No dysphagia. Intermittent nausea. Hemoglobin A1c's7 range. On potent narcotics. Takes Linzess 290, MiraLax daily and sometimes prune juice to have a bowel movement every 2-3 days. When it does come, it is loose. Denies blood per rectum. History of multiple colonic polyps 2015; due for surveillance within the next year. Intolerant to Reglan previously. Plain films negative for obstipation recently.  Past Medical History:  Diagnosis Date  . Atrial fibrillation (HCC)    Paroxysmal  . Bladder dysfunction    Self urinary catheterization  . COPD (chronic obstructive pulmonary disease) ([place])   . DDD (degenerative disc disease)   . Depression   . Esophageal spasm    NTG and Norvasc  . Essential hypertension, benign   . Fibromyalgia   . Gastroparesis   . GERD (gastroesophageal reflux disease)   . History of cardiac catheterization    Normal coronary arteries 11/12  . History of pituitary tumor   . Irritable bowel syndrome   . Lymphedema   . Osteoarthritis   . Sleep apnea    BIPAP  . Type 2 diabetes mellitus ([place])   . Urinary tract infection     Past Surgical History:  Procedure Laterality Date  . ABDOMINAL HYSTERECTOMY    . ANTERIOR CERVICAL DECOMP/DISCECTOMY FUSION N/A 12/02/2014   Procedure: Cervical five cervical six anterior cervical decompression with fusion interbody prosthesis plating and bone graft;  Surgeon: Newman Pies, MD;  Location: [place] [facility];  Service: Neurosurgery;  Laterality: N/A;  C56 anterior cervical decompression with fusion interbody prosthesis plating and bonegraft  . APPENDECTOMY    . BACK SURGERY    . CARDIAC  CATHETERIZATION    . CARDIOVERSION N/A 03/26/2014   Procedure: CARDIOVERSION;  Surgeon: Herminio Commons, MD;  Location: [facility];  Service: Endoscopy;  Laterality: N/A;  . CARDIOVERSION N/A 05/04/2014   Procedure: CARDIOVERSION;  Surgeon: Satira Sark, MD;  Location: [facility];  Service: Cardiovascular;  Laterality: N/A;  . CHOLECYSTECTOMY    . COLONOSCOPY     Approximately 2003. Per medical records, internal hemorrhoids noted  . COLONOSCOPY N/A 06/25/2013   Dr. Gala Romney: Anal canal hemorrhoids-more likely the source of paper hematochezia. Redundant, capacious colon. Multiple colonic polyps-tubular adenoma  . ESOPHAGOGASTRODUODENOSCOPY  10/12/2004   [id number] plaquing on the esophageal mucosa of uncertain significance, not typical of what is seen with candida esophagitis status post KOH brushing for KOH prep and biopsy for histology. Rule out candida esophagitis/eosinophilic esophagitis. Otherwise normal esophagus. Tiny hiatal hernia. Otherwise, normal stomach, normal D1 and D2. Benign biopsy of esophagus, unknown KOH status.   . ESOPHAGOGASTRODUODENOSCOPY N/A 06/25/2013   Dr. Verdean Murin:mild chronic gastritis  . KNEE SURGERY    . LEFT HEART CATHETERIZATION WITH CORONARY ANGIOGRAM N/A 11/15/2010   Procedure: LEFT HEART CATHETERIZATION WITH CORONARY ANGIOGRAM;  Surgeon: Laverda Page, MD;  Location: [facility] C[facility] LAB;  Service: Cardiovascular;  Laterality: N/A;  . LUNG BIOPSY      Prior to Admission medications   Medication Sig Start Date End Date Taking? Authorizing Provider  acidophilus (RISAQUAD) CAPS capsule Take 1 capsule by mouth daily.   Yes Historical Provider, MD  albuterol (PROAIR HFA) 108 (90 BASE) MCG/ACT inhaler  Inhale 2 puffs into the lungs every 6 (six) hours as needed for wheezing or shortness of breath.    Yes Historical Provider, MD  albuterol (PROVENTIL) (2.5 MG/3ML) 0.083% nebulizer solution Take 2.5 mg by nebulization every 6 (six) hours as needed for wheezing. For shortness of  breath.   Yes Historical Provider, MD  aspirin EC 81 MG tablet Take 1 tablet (81 mg total) by mouth daily. 09/16/15  Yes Lavina Hamman, MD  atorvastatin (LIPITOR) 10 MG tablet Take 10 mg by mouth at bedtime. 03/12/15  Yes Historical Provider, MD  baclofen (LIORESAL) 20 MG tablet Take 20 mg by mouth 3 (three) times daily.     Yes Historical Provider, MD  buPROPion (WELLBUTRIN) 75 MG tablet Take 1 tablet (75 mg total) by mouth daily. 06/01/15 05/31/16 Yes Cloria Spring, MD  Cranberry 250 MG CAPS Take 1 capsule by mouth daily.   Yes Historical Provider, MD  diazepam (VALIUM) 5 MG tablet Take 5 mg by mouth every 12 (twelve) hours as needed for anxiety. For anxiety.   Yes Historical Provider, MD  diclofenac sodium (VOLTAREN) 1 % GEL Apply 2-4 g topically 4 (four) times daily as needed. For pain   Yes Historical Provider, MD  dicyclomine (BENTYL) 10 MG capsule Take 1 capsule (10 mg total) by mouth 4 (four) times daily -  before meals and at bedtime. For abdominal cramping and loose stools Patient taking differently: Take 10 mg by mouth 4 (four) times daily as needed. For abdominal cramping and loose stools 06/05/13  Yes Annitta Needs, NP  diltiazem (CARDIZEM CD) 120 MG 24 hr capsule TAKE 1 CAPSULE BY MOUTH EVERY DAY 04/19/15  Yes Satira Sark, MD  docusate sodium (COLACE) 100 MG capsule Take 100 mg by mouth 2 (two) times daily.   Yes Historical Provider, MD  flecainide (TAMBOCOR) 50 MG tablet TAKE 2 TABLETS BY MOUTH TWICE DAILY 07/12/15  Yes Satira Sark, MD  furosemide (LASIX) 80 MG tablet Take 80 mg by mouth 2 (two) times daily.     Yes Historical Provider, MD  gabapentin (NEURONTIN) 600 MG tablet Take 600 mg by mouth 4 (four) times daily.   Yes Historical Provider, MD  glimepiride (AMARYL) 2 MG tablet Take 2-4 mg by mouth 2 (two) times daily. 4mg  in the morning and 2mg  in the evening 05/21/15  Yes Historical Provider, MD  hydrocortisone (PROCTOSOL HC) 2.5 % rectal cream Place 1 application rectally 2  (two) times daily. Patient taking differently: Place 1 application rectally daily as needed for hemorrhoids.  08/20/13  Yes Annitta Needs, NP  isosorbide mononitrate (IMDUR) 120 MG 24 hr tablet Take 120 mg by mouth every morning.    Yes Historical Provider, MD  isosorbide mononitrate (IMDUR) 30 MG 24 hr tablet Take 30 mg by mouth every evening.    Yes Historical Provider, MD  linaclotide (LINZESS) 290 MCG CAPS capsule Take 290 mcg by mouth daily before breakfast.   Yes Historical Provider, MD  lisinopril (PRINIVIL,ZESTRIL) 2.5 MG tablet Take 2.5 mg by mouth at bedtime.  10/29/12  Yes Historical Provider, MD  loratadine (CLARITIN) 10 MG tablet Take 10 mg by mouth daily.     Yes Historical Provider, MD  meclizine (ANTIVERT) 25 MG tablet Take 25 mg by mouth 3 (three) times daily as needed for dizziness.  07/31/13  Yes Historical Provider, MD  metFORMIN (GLUCOPHAGE) 500 MG tablet Take 1,000 mg by mouth 2 (two) times daily.  05/18/13  Yes Historical Provider,  MD  methocarbamol (ROBAXIN) 500 MG tablet Take 1 tablet (500 mg total) by mouth every 8 (eight) hours as needed for muscle spasms. 09/15/15  Yes Lavina Hamman, MD  morphine (MS CONTIN) 15 MG 12 hr tablet Take 1 tablet by mouth 3 (three) times daily. Takes with the 15 mg. 09/09/15  Yes Historical Provider, MD  morphine (MS CONTIN) 30 MG 12 hr tablet Take 1 tablet by mouth 3 (three) times daily. Takes with 15 mg tablet. 09/09/15  Yes Historical Provider, MD  Multiple Vitamin (MULTIVITAMIN WITH MINERALS) TABS tablet Take 1 tablet by mouth daily.   Yes Historical Provider, MD  NEXIUM 40 MG capsule Take 1 capsule (40 mg total) by mouth daily at 12 noon. 09/23/15  Yes Carlis Stable, NP  nitroGLYCERIN (NITROLINGUAL) 0.4 MG/SPRAY spray PLACE 1 SPRAY UNDER THE TONGUE AS NEEDED. FOR CHEST PAIN 06/18/15  Yes Satira Sark, MD  NON FORMULARY BIPAP - STARTED ABOUT 1 1/2 years AGO.   Yes Historical Provider, MD  nystatin (MYCOSTATIN/NYSTOP) 100000 UNIT/GM POWD Apply 1 g  topically daily.    Yes Historical Provider, MD  omega-3 acid ethyl esters (LOVAZA) 1 g capsule Take 1 capsule by mouth 4 (four) times daily. 09/13/15  Yes Historical Provider, MD  ondansetron (ZOFRAN) 4 MG tablet TAKE 1 TABLET BY MOUTH EVERY 8 HOURS AS NEEDED FOR NAUSEA OR VOMITING 09/15/15  Yes Mahala Menghini, PA-C  oxyCODONE-acetaminophen (PERCOCET) 10-325 MG per tablet Take 1 tablet by mouth every 6 (six) hours as needed. For pain.   Yes Historical Provider, MD  polyethylene glycol powder (GLYCOLAX/MIRALAX) powder Take 17 g by mouth daily.  08/25/13  Yes Historical Provider, MD  potassium chloride SA (K-DUR,KLOR-CON) 20 MEQ tablet Take 40 mEq by mouth 4 (four) times daily.     Yes Historical Provider, MD  PRESCRIPTION MEDICATION Apply 1 application topically daily as needed (pain). COMPOUNDED MEDICATION FROM [name]. -- C-DOXEPIN-CYCLOBEN-GABAPENT-IBUPROFEN-LIDO 1.9-2-2-2-2.25% Grams   Yes Historical Provider, MD  ranitidine (ZANTAC) 150 MG tablet Take 150 mg by mouth daily as needed for heartburn.    Yes Historical Provider, MD  Rivaroxaban (XARELTO) 20 MG TABS Take 20 mg by mouth every evening.    Yes Historical Provider, MD  spironolactone (ALDACTONE) 50 MG tablet Take 50 mg by mouth 3 (three) times daily.     Yes Historical Provider, MD    Allergies as of 09/24/2015 - Review Complete 09/24/2015  Allergen Reaction Noted  . Hyoscyamine sulfate Other (See Comments)   . Reglan [metoclopramide] Other (See Comments) 11/05/2014    Family History  Problem Relation Age of Onset  . Coronary artery disease Father     Died age 55  . Heart attack Father   . Arrhythmia Father     AF  . Diabetes Father   . Parkinson's disease Father   . Arrhythmia Mother     AF  . Stroke Mother   . Dementia Mother   . Cancer Mother     UTERINE   . Parkinson's disease Mother   . Arrhythmia Brother     AF  . Colon cancer Maternal Grandfather   . Colon cancer Paternal Aunt   . Colon cancer Paternal Aunt     . Diabetes Son   . Cancer Sister     BREAST   . Depression Sister   . Depression Sister   . Depression Sister     Social History   Social History  . Marital status: Divorced  Spouse name: N/A  . Number of children: N/A  . Years of education: N/A   Occupational History  . Not on file.   Social History Main Topics  . Smoking status: Former Smoker    Packs/day: 1.50    Years: 30.00    Types: Cigarettes    Start date: 03/06/1977    Quit date: 01/03/2007  . Smokeless tobacco: Never Used  . Alcohol use No  . Drug use: No  . Sexual activity: No   Other Topics Concern  . Not on file   Social History Narrative  . No narrative on file    Review of Systems: See HPI, otherwise negative ROS  Physical Exam: BP (!) 111/56   Pulse 71   Temp 97.6 F (36.4 C) (Oral)   Ht 5\' 9"  (1.753 m)   Wt (!) 312 lb 3.2 oz (141.6 kg)   BMI 46.10 kg/m  General:   Alert, , pleasant and cooperative in NAD. accompanied by her caregiver Neck:  Supple; no masses or thyromegaly. No significant cervical adenopathy. Lungs:  Clear throughout to auscultation.   No wheezes, crackles, or rhonchi. No acute distress. Heart:  Regular rate and rhythm; no murmurs, clicks, rubs,  or gallops. Abdomen: Massively obese. No succussion splash. Positive bowel sounds.Non-distended, normal bowel sounds.  Soft and nontender without appreciable mass or hepatosplenomegaly.  Pulses:  Normal pulses noted. Extremities:  Without clubbing or edema.  Impression:  55 year old lady with multiple medical problems including relatively poorly controlled diabetes mellitus, gastroparesis, GERD and constipation which is also likely multifactorial in origin. Certainly, a component of opioid-induced constipation. History of colonic adenomas. I discussed the challenges of successful management of constipation as long as she is taking significant opioids daily.  Recommendations:  Information on constipation and GERD  Continue  Linzess and Miralax daily;  Movantik could be an option in the future.  Any decrease in opioids will help GERD and constipation  Would stop Colace for now  Office visit in 3 months   We should probably plan for surveillance colonoscopy in 2018.  It would be a significant task achieving an adequate colon preparation.      Notice: This dictation was prepared with Dragon dictation along with smaller phrase technology. Any transcriptional errors that result from this process are unintentional and may not be corrected upon review.

## 2015-09-27 DIAGNOSIS — R600 Localized edema: Secondary | ICD-10-CM | POA: Diagnosis not present

## 2015-09-27 DIAGNOSIS — R591 Generalized enlarged lymph nodes: Secondary | ICD-10-CM | POA: Diagnosis not present

## 2015-09-28 ENCOUNTER — Ambulatory Visit
Admission: RE | Admit: 2015-09-28 | Discharge: 2015-09-28 | Disposition: A | Discharge status: Home / Self Care | Payer: Medicare Other | Source: Ambulatory Visit | Attending: Neurosurgery | Admitting: Neurosurgery

## 2015-09-28 DIAGNOSIS — M4806 Spinal stenosis, lumbar region: Secondary | ICD-10-CM | POA: Diagnosis not present

## 2015-09-28 DIAGNOSIS — M48061 Spinal stenosis, lumbar region without neurogenic claudication: Secondary | ICD-10-CM

## 2015-09-28 MED ORDER — GADOBENATE DIMEGLUMINE 529 MG/ML IV SOLN
20.0000 mL | Freq: Once | INTRAVENOUS | Status: AC | PRN
  Administered 2015-09-28: 20 mL via INTRAVENOUS

## 2015-09-30 ENCOUNTER — Ambulatory Visit (HOSPITAL_COMMUNITY): Payer: Self-pay | Admitting: Psychiatry

## 2015-10-04 DIAGNOSIS — E119 Type 2 diabetes mellitus without complications: Secondary | ICD-10-CM | POA: Diagnosis not present

## 2015-10-04 DIAGNOSIS — I10 Essential (primary) hypertension: Secondary | ICD-10-CM | POA: Diagnosis not present

## 2015-10-04 DIAGNOSIS — I2510 Atherosclerotic heart disease of native coronary artery without angina pectoris: Secondary | ICD-10-CM | POA: Diagnosis not present

## 2015-10-05 DIAGNOSIS — N312 Flaccid neuropathic bladder, not elsewhere classified: Secondary | ICD-10-CM | POA: Diagnosis not present

## 2015-10-05 DIAGNOSIS — N8111 Cystocele, midline: Secondary | ICD-10-CM | POA: Diagnosis not present

## 2015-10-05 DIAGNOSIS — R32 Unspecified urinary incontinence: Secondary | ICD-10-CM | POA: Diagnosis not present

## 2015-10-07 DIAGNOSIS — G894 Chronic pain syndrome: Secondary | ICD-10-CM | POA: Diagnosis not present

## 2015-10-07 DIAGNOSIS — M961 Postlaminectomy syndrome, not elsewhere classified: Secondary | ICD-10-CM | POA: Diagnosis not present

## 2015-10-07 DIAGNOSIS — E1142 Type 2 diabetes mellitus with diabetic polyneuropathy: Secondary | ICD-10-CM | POA: Diagnosis not present

## 2015-10-07 DIAGNOSIS — Z79891 Long term (current) use of opiate analgesic: Secondary | ICD-10-CM | POA: Diagnosis not present

## 2015-10-08 DIAGNOSIS — I4891 Unspecified atrial fibrillation: Secondary | ICD-10-CM | POA: Diagnosis not present

## 2015-10-08 DIAGNOSIS — R220 Localized swelling, mass and lump, head: Secondary | ICD-10-CM | POA: Diagnosis not present

## 2015-10-08 DIAGNOSIS — E1142 Type 2 diabetes mellitus with diabetic polyneuropathy: Secondary | ICD-10-CM | POA: Diagnosis not present

## 2015-10-08 DIAGNOSIS — Z299 Encounter for prophylactic measures, unspecified: Secondary | ICD-10-CM | POA: Diagnosis not present

## 2015-10-08 DIAGNOSIS — I2510 Atherosclerotic heart disease of native coronary artery without angina pectoris: Secondary | ICD-10-CM | POA: Diagnosis not present

## 2015-10-11 ENCOUNTER — Other Ambulatory Visit (HOSPITAL_COMMUNITY): Payer: Self-pay | Admitting: Psychiatry

## 2015-10-13 ENCOUNTER — Other Ambulatory Visit (HOSPITAL_COMMUNITY): Payer: Self-pay | Admitting: Psychiatry

## 2015-10-22 ENCOUNTER — Ambulatory Visit: Payer: Self-pay

## 2015-10-27 ENCOUNTER — Ambulatory Visit: Payer: Self-pay

## 2015-10-27 DIAGNOSIS — N819 Female genital prolapse, unspecified: Secondary | ICD-10-CM | POA: Diagnosis not present

## 2015-10-27 DIAGNOSIS — N816 Rectocele: Secondary | ICD-10-CM | POA: Diagnosis not present

## 2015-10-27 DIAGNOSIS — N8111 Cystocele, midline: Secondary | ICD-10-CM | POA: Diagnosis not present

## 2015-10-27 DIAGNOSIS — Z299 Encounter for prophylactic measures, unspecified: Secondary | ICD-10-CM | POA: Diagnosis not present

## 2015-10-27 DIAGNOSIS — N319 Neuromuscular dysfunction of bladder, unspecified: Secondary | ICD-10-CM | POA: Diagnosis not present

## 2015-10-27 DIAGNOSIS — E1142 Type 2 diabetes mellitus with diabetic polyneuropathy: Secondary | ICD-10-CM | POA: Diagnosis not present

## 2015-10-27 DIAGNOSIS — L89301 Pressure ulcer of unspecified buttock, stage 1: Secondary | ICD-10-CM | POA: Diagnosis not present

## 2015-10-27 DIAGNOSIS — M542 Cervicalgia: Secondary | ICD-10-CM | POA: Diagnosis not present

## 2015-10-27 DIAGNOSIS — Z6841 Body Mass Index (BMI) 40.0 and over, adult: Secondary | ICD-10-CM | POA: Diagnosis not present

## 2015-10-27 DIAGNOSIS — M25512 Pain in left shoulder: Secondary | ICD-10-CM | POA: Diagnosis not present

## 2015-10-27 DIAGNOSIS — N3281 Overactive bladder: Secondary | ICD-10-CM | POA: Diagnosis not present

## 2015-10-28 ENCOUNTER — Ambulatory Visit: Payer: Self-pay

## 2015-11-02 DIAGNOSIS — I10 Essential (primary) hypertension: Secondary | ICD-10-CM | POA: Diagnosis not present

## 2015-11-02 DIAGNOSIS — Z299 Encounter for prophylactic measures, unspecified: Secondary | ICD-10-CM | POA: Diagnosis not present

## 2015-11-02 DIAGNOSIS — E1142 Type 2 diabetes mellitus with diabetic polyneuropathy: Secondary | ICD-10-CM | POA: Diagnosis not present

## 2015-11-02 DIAGNOSIS — L89301 Pressure ulcer of unspecified buttock, stage 1: Secondary | ICD-10-CM | POA: Diagnosis not present

## 2015-11-02 DIAGNOSIS — M25512 Pain in left shoulder: Secondary | ICD-10-CM | POA: Diagnosis not present

## 2015-11-02 NOTE — Progress Notes (Signed)
Cardiology Office Note  Date: 11/03/2015   ID: [name] [name], DOB 04-Dec-1960, MRN [id number]  PCP: Glenda Chroman, MD  Primary Cardiologist: Rozann Lesches, MD   Chief Complaint  Patient presents with  . PAF    History of Present Illness: [name] [name] is a 55 y.o. female last seen in April. Records indicate hospitalization in September with atypical chest pain, troponin I levels were negative and follow-up echocardiogram did not show any decrease in LVEF or new wall motion abnormalities. She was seen by Dr. Bronson Ing in consultation, no further cardiac workup indicated as an inpatient.  She comes in today for a follow-up visit. Main complaint is of chronic pain, currently undergoing adjustments in her pain control regimen with follow-up planned per Pain Specialist later this week.  She does not endorse any palpitations and has not had any recent recurrent atrial fibrillation. I reviewed her recent tracings and discussed the results with her.  She continues to have chest pain that is fairly atypical in description. Also history of esophageal spasm. She has previously documented normal coronary arteries.  I did review her medications, asked her to stop aspirin which was started during her hospital stay, since she is taking Xarelto.  Past Medical History:  Diagnosis Date  . Atrial fibrillation (HCC)    Paroxysmal  . Bladder dysfunction    Self urinary catheterization  . COPD (chronic obstructive pulmonary disease) ([place])   . DDD (degenerative disc disease)   . Depression   . Esophageal spasm    NTG and Norvasc  . Essential hypertension, benign   . Fibromyalgia   . Gastroparesis   . GERD (gastroesophageal reflux disease)   . History of cardiac catheterization    Normal coronary arteries 11/12  . History of pituitary tumor   . Irritable bowel syndrome   . Lymphedema   . Osteoarthritis   . Sleep apnea    BIPAP  . Type 2 diabetes mellitus ([place])   . Urinary tract  infection     Past Surgical History:  Procedure Laterality Date  . ABDOMINAL HYSTERECTOMY    . ANTERIOR CERVICAL DECOMP/DISCECTOMY FUSION N/A 12/02/2014   Procedure: Cervical five cervical six anterior cervical decompression with fusion interbody prosthesis plating and bone graft;  Surgeon: Newman Pies, MD;  Location: [place] [facility];  Service: Neurosurgery;  Laterality: N/A;  C56 anterior cervical decompression with fusion interbody prosthesis plating and bonegraft  . APPENDECTOMY    . BACK SURGERY    . CARDIAC CATHETERIZATION    . CARDIOVERSION N/A 03/26/2014   Procedure: CARDIOVERSION;  Surgeon: Herminio Commons, MD;  Location: [facility];  Service: Endoscopy;  Laterality: N/A;  . CARDIOVERSION N/A 05/04/2014   Procedure: CARDIOVERSION;  Surgeon: Satira Sark, MD;  Location: [facility];  Service: Cardiovascular;  Laterality: N/A;  . CHOLECYSTECTOMY    . COLONOSCOPY     Approximately 2003. Per medical records, internal hemorrhoids noted  . COLONOSCOPY N/A 06/25/2013   Dr. Gala Romney: Anal canal hemorrhoids-more likely the source of paper hematochezia. Redundant, capacious colon. Multiple colonic polyps-tubular adenoma  . ESOPHAGOGASTRODUODENOSCOPY  10/12/2004   [id number] plaquing on the esophageal mucosa of uncertain significance, not typical of what is seen with candida esophagitis status post KOH brushing for KOH prep and biopsy for histology. Rule out candida esophagitis/eosinophilic esophagitis. Otherwise normal esophagus. Tiny hiatal hernia. Otherwise, normal stomach, normal D1 and D2. Benign biopsy of esophagus, unknown KOH status.   . ESOPHAGOGASTRODUODENOSCOPY N/A 06/25/2013   Dr. Chelsea Aus chronic  gastritis  . KNEE SURGERY    . LEFT HEART CATHETERIZATION WITH CORONARY ANGIOGRAM N/A 11/15/2010   Procedure: LEFT HEART CATHETERIZATION WITH CORONARY ANGIOGRAM;  Surgeon: Laverda Page, MD;  Location: [facility] CATH LAB;  Service: Cardiovascular;  Laterality: N/A;  . LUNG BIOPSY      Current  Outpatient Prescriptions  Medication Sig Dispense Refill  . acidophilus (RISAQUAD) CAPS capsule Take 1 capsule by mouth daily.    Marland Kitchen albuterol (PROAIR HFA) 108 (90 BASE) MCG/ACT inhaler Inhale 2 puffs into the lungs every 6 (six) hours as needed for wheezing or shortness of breath.     Marland Kitchen albuterol (PROVENTIL) (2.5 MG/3ML) 0.083% nebulizer solution Take 2.5 mg by nebulization every 6 (six) hours as needed for wheezing. For shortness of breath.    Marland Kitchen aspirin EC 81 MG tablet Take 1 tablet (81 mg total) by mouth daily. 30 tablet 0  . atorvastatin (LIPITOR) 10 MG tablet Take 10 mg by mouth at bedtime.  3  . baclofen (LIORESAL) 20 MG tablet Take 20 mg by mouth 3 (three) times daily.      Marland Kitchen buPROPion (WELLBUTRIN) 75 MG tablet Take 1 tablet (75 mg total) by mouth daily. 30 tablet 3  . Cranberry 250 MG CAPS Take 1 capsule by mouth daily.    . diazepam (VALIUM) 5 MG tablet Take 5 mg by mouth every 12 (twelve) hours as needed for anxiety. For anxiety.    . diclofenac sodium (VOLTAREN) 1 % GEL Apply 2-4 g topically 4 (four) times daily as needed. For pain    . dicyclomine (BENTYL) 10 MG capsule Take 1 capsule (10 mg total) by mouth 4 (four) times daily -  before meals and at bedtime. For abdominal cramping and loose stools (Patient taking differently: Take 10 mg by mouth 4 (four) times daily as needed. For abdominal cramping and loose stools) 120 capsule 5  . diltiazem (CARDIZEM CD) 120 MG 24 hr capsule TAKE 1 CAPSULE BY MOUTH EVERY DAY 90 capsule 3  . docusate sodium (COLACE) 100 MG capsule Take 100 mg by mouth 2 (two) times daily.    . flecainide (TAMBOCOR) 50 MG tablet TAKE 2 TABLETS BY MOUTH TWICE DAILY 120 tablet 6  . furosemide (LASIX) 80 MG tablet Take 80 mg by mouth 2 (two) times daily.      Marland Kitchen gabapentin (NEURONTIN) 600 MG tablet Take 600 mg by mouth 4 (four) times daily.    Marland Kitchen glimepiride (AMARYL) 2 MG tablet Take 2-4 mg by mouth 2 (two) times daily. 4mg  in the morning and 2mg  in the evening    .  hydrocortisone (PROCTOSOL HC) 2.5 % rectal cream Place 1 application rectally 2 (two) times daily. (Patient taking differently: Place 1 application rectally daily as needed for hemorrhoids. ) 30 g 0  . isosorbide mononitrate (IMDUR) 120 MG 24 hr tablet Take 120 mg by mouth every morning.     . isosorbide mononitrate (IMDUR) 30 MG 24 hr tablet Take 30 mg by mouth every evening.     . linaclotide (LINZESS) 290 MCG CAPS capsule Take 290 mcg by mouth daily before breakfast.    . lisinopril (PRINIVIL,ZESTRIL) 2.5 MG tablet Take 2.5 mg by mouth at bedtime.     Marland Kitchen loratadine (CLARITIN) 10 MG tablet Take 10 mg by mouth daily.      . meclizine (ANTIVERT) 25 MG tablet Take 25 mg by mouth 3 (three) times daily as needed for dizziness.     . metFORMIN (GLUCOPHAGE) 500 MG tablet  Take 1,000 mg by mouth 2 (two) times daily.     . methocarbamol (ROBAXIN) 500 MG tablet Take 1 tablet (500 mg total) by mouth every 8 (eight) hours as needed for muscle spasms. 15 tablet 0  . morphine (MS CONTIN) 15 MG 12 hr tablet Take 1 tablet by mouth 3 (three) times daily. Takes with the 15 mg.    . Multiple Vitamin (MULTIVITAMIN WITH MINERALS) TABS tablet Take 1 tablet by mouth daily.    Marland Kitchen NEXIUM 40 MG capsule Take 1 capsule (40 mg total) by mouth daily at 12 noon. 30 capsule 5  . nitroGLYCERIN (NITROLINGUAL) 0.4 MG/SPRAY spray PLACE 1 SPRAY UNDER THE TONGUE AS NEEDED. FOR CHEST PAIN 12 g 3  . NON FORMULARY BIPAP - STARTED ABOUT 1 1/2 years AGO.    Marland Kitchen nystatin (MYCOSTATIN/NYSTOP) 100000 UNIT/GM POWD Apply 1 g topically daily.     Marland Kitchen omega-3 acid ethyl esters (LOVAZA) 1 g capsule Take 1 capsule by mouth 4 (four) times daily.    . ondansetron (ZOFRAN) 4 MG tablet TAKE 1 TABLET BY MOUTH EVERY 8 HOURS AS NEEDED FOR NAUSEA OR VOMITING 30 tablet 1  . [place] ER, CRUSH RESISTANT, 20 MG T12A Take 1 tablet by mouth 2 (two) times daily.    Marland Kitchen oxyCODONE-acetaminophen (PERCOCET) 10-325 MG per tablet Take 1 tablet by mouth every 6 (six) hours as  needed. For pain.    . polyethylene glycol powder (GLYCOLAX/MIRALAX) powder Take 17 g by mouth daily.     . potassium chloride SA (K-DUR,KLOR-CON) 20 MEQ tablet Take 40 mEq by mouth 4 (four) times daily.      Marland Kitchen PRESCRIPTION MEDICATION Apply 1 application topically daily as needed (pain). COMPOUNDED MEDICATION FROM [name]. -- C-DOXEPIN-CYCLOBEN-GABAPENT-IBUPROFEN-LIDO 1.9-2-2-2-2.25% Grams    . ranitidine (ZANTAC) 150 MG tablet Take 150 mg by mouth daily as needed for heartburn.     . Rivaroxaban (XARELTO) 20 MG TABS Take 20 mg by mouth every evening.     Marland Kitchen spironolactone (ALDACTONE) 50 MG tablet Take 50 mg by mouth 3 (three) times daily.       No current facility-administered medications for this visit.    Allergies:  Hyoscyamine sulfate and Reglan [metoclopramide]   Social History: The patient  reports that she quit smoking about 8 years ago. Her smoking use included Cigarettes. She started smoking about 38 years ago. She has a 45.00 pack-year smoking history. She has never used smokeless tobacco. She reports that she does not drink alcohol or use drugs.   ROS:  Please see the history of present illness. Otherwise, complete review of systems is positive for chronic pain, anxiety.  All other systems are reviewed and negative.   Physical Exam: VS:  BP 110/68   Pulse 66   Ht 5\' 9"  (1.753 m)   Wt (!) 308 lb (139.7 kg)   BMI 45.48 kg/m , BMI Body mass index is 45.48 kg/m.  Wt Readings from Last 3 Encounters:  11/03/15 (!) 308 lb (139.7 kg)  09/24/15 (!) 312 lb 3.2 oz (141.6 kg)  09/28/15 (!) 312 lb (141.5 kg)    Obese woman in no acute distress. HEENT: Conjunctiva and lids normal, oropharynx clear.  Neck: No elevated JVP or carotid bruits.  Lungs: Clear to auscultation, nonlabored breathing at rest.  Cardiac: RRR, no S3 or significant systolic murmur, no pericardial rub.  Abdomen: Soft, nontender, bowel sounds present.  Extremities: Mild edema, distal pulses 2+. Uses a cane to  walk. Skin: Warm and dry.  Musculoskeletal: No kyphosis. Neuropsychiatric: Alert and oriented 3, affect appropriate.  ECG: I personally reviewed the tracing from 09/15/2015 which showed sinus rhythm with prolonged PR interval, leftward axis, QRS duration 120 ms.  Recent Labwork: 09/15/2015: B Natriuretic Peptide 40.0; BUN 8; Creatinine, Ser 0.60; Hemoglobin 11.9; Platelets 273; Potassium 3.5; Sodium 135   Other Studies Reviewed Today:  Echocardiogram 09/15/2015: Study Conclusions  - Left ventricle: The cavity size was normal. Systolic function was   normal. The estimated ejection fraction was in the range of 60%   to 65%. Wall motion was normal; there were no regional wall   motion abnormalities. Doppler parameters are consistent with   abnormal left ventricular relaxation (grade 1 diastolic   dysfunction). Mild concentric and moderate focal basal septal   hypertrophy.  Assessment and Plan:  1. Paroxysmal atrial fibrillation, maintaining sinus rhythm. Plan to continue Xarelto, flecainide, and Cardizem CD. Recent ECGs reviewed.  2. Recurring atypical chest pain with prior documentation of normal coronary arteries. Also has history of esophageal spasm. Recent hospital stay with normal troponin I levels noted. Echocardiogram showed normal LVEF is well without wall motion maladies. Ischemic testing not planned at this time.  3. Essential hypertension, blood pressure is normal today.  4. Morbid obesity. States that she is considering weight loss surgery.  Current medicines were reviewed with the patient today.  Disposition: Follow-up in 6 months.  Signed, Satira Sark, MD, [facility] 11/03/2015 4:26 PM    [facility], [place], [place] [address] Phone: [phone]; Fax: [phone]

## 2015-11-03 ENCOUNTER — Ambulatory Visit (INDEPENDENT_AMBULATORY_CARE_PROVIDER_SITE_OTHER): Payer: Medicare Other | Admitting: Cardiology

## 2015-11-03 ENCOUNTER — Encounter: Payer: Self-pay | Admitting: Cardiology

## 2015-11-03 VITALS — BP 110/68 | HR 66 | Ht 69.0 in | Wt 308.0 lb

## 2015-11-03 DIAGNOSIS — I10 Essential (primary) hypertension: Secondary | ICD-10-CM

## 2015-11-03 DIAGNOSIS — I209 Angina pectoris, unspecified: Secondary | ICD-10-CM

## 2015-11-03 DIAGNOSIS — I480 Paroxysmal atrial fibrillation: Secondary | ICD-10-CM | POA: Diagnosis not present

## 2015-11-03 DIAGNOSIS — I208 Other forms of angina pectoris: Secondary | ICD-10-CM | POA: Diagnosis not present

## 2015-11-03 NOTE — Patient Instructions (Signed)

## 2015-11-05 DIAGNOSIS — E1142 Type 2 diabetes mellitus with diabetic polyneuropathy: Secondary | ICD-10-CM | POA: Diagnosis not present

## 2015-11-05 DIAGNOSIS — Z79891 Long term (current) use of opiate analgesic: Secondary | ICD-10-CM | POA: Diagnosis not present

## 2015-11-05 DIAGNOSIS — G894 Chronic pain syndrome: Secondary | ICD-10-CM | POA: Diagnosis not present

## 2015-11-08 DIAGNOSIS — I10 Essential (primary) hypertension: Secondary | ICD-10-CM | POA: Diagnosis not present

## 2015-11-08 DIAGNOSIS — I2510 Atherosclerotic heart disease of native coronary artery without angina pectoris: Secondary | ICD-10-CM | POA: Diagnosis not present

## 2015-11-08 DIAGNOSIS — E119 Type 2 diabetes mellitus without complications: Secondary | ICD-10-CM | POA: Diagnosis not present

## 2015-11-12 DIAGNOSIS — M25512 Pain in left shoulder: Secondary | ICD-10-CM | POA: Diagnosis not present

## 2015-11-15 ENCOUNTER — Encounter: Payer: Self-pay | Admitting: Internal Medicine

## 2015-11-18 DIAGNOSIS — M25412 Effusion, left shoulder: Secondary | ICD-10-CM | POA: Diagnosis not present

## 2015-11-18 DIAGNOSIS — M65812 Other synovitis and tenosynovitis, left shoulder: Secondary | ICD-10-CM | POA: Diagnosis not present

## 2015-11-18 DIAGNOSIS — M75112 Incomplete rotator cuff tear or rupture of left shoulder, not specified as traumatic: Secondary | ICD-10-CM | POA: Diagnosis not present

## 2015-11-19 ENCOUNTER — Ambulatory Visit: Payer: Self-pay

## 2015-11-19 DIAGNOSIS — M25512 Pain in left shoulder: Secondary | ICD-10-CM | POA: Diagnosis not present

## 2015-12-02 ENCOUNTER — Telehealth: Payer: Self-pay | Admitting: *Deleted

## 2015-12-02 NOTE — Telephone Encounter (Signed)
Received fax from [facility] (Dr. Earlie Server) requesting clearance for left shoulder scope and if okay to stop Xarelto & for how may days prior to procedure.

## 2015-12-02 NOTE — Telephone Encounter (Signed)
I saw this yesterday and completed it, gave it to [place]. Please check with them.

## 2015-12-02 NOTE — Telephone Encounter (Signed)
LM on pt's private vm that her clearance was faxed to [facility] today

## 2015-12-02 NOTE — Telephone Encounter (Signed)
Will fwd note to [facility] triage.

## 2015-12-07 DIAGNOSIS — E1142 Type 2 diabetes mellitus with diabetic polyneuropathy: Secondary | ICD-10-CM | POA: Diagnosis not present

## 2015-12-07 DIAGNOSIS — G894 Chronic pain syndrome: Secondary | ICD-10-CM | POA: Diagnosis not present

## 2015-12-07 DIAGNOSIS — Z79891 Long term (current) use of opiate analgesic: Secondary | ICD-10-CM | POA: Diagnosis not present

## 2015-12-14 ENCOUNTER — Ambulatory Visit (INDEPENDENT_AMBULATORY_CARE_PROVIDER_SITE_OTHER): Payer: Medicare Other | Admitting: Internal Medicine

## 2015-12-14 ENCOUNTER — Encounter: Payer: Self-pay | Admitting: Internal Medicine

## 2015-12-14 VITALS — BP 128/67 | HR 87 | Temp 98.3°F | Ht 69.0 in | Wt 305.2 lb

## 2015-12-14 DIAGNOSIS — K219 Gastro-esophageal reflux disease without esophagitis: Secondary | ICD-10-CM | POA: Diagnosis not present

## 2015-12-14 DIAGNOSIS — I208 Other forms of angina pectoris: Secondary | ICD-10-CM

## 2015-12-14 DIAGNOSIS — K5903 Drug induced constipation: Secondary | ICD-10-CM

## 2015-12-14 NOTE — Patient Instructions (Signed)
Continue Linzess 290 daily  Continue Colace daily  Increase Miralax to twice daily  Benefiber 1 tablespoon twice daily  Keep a stool diary  Office visit in 6 weeks  Continue Nexium 40 mg daily  Call us in 2 weeks to give [place] a progress report

## 2015-12-14 NOTE — Progress Notes (Signed)
Primary Care Physician:  Glenda Chroman, MD Primary Gastroenterologist:  Dr. Gala Romney  Pre-Procedure History & Physical: HPI:  [name] [name] is a 55 y.o. female here for follow-up GERD constipation-opioid induced. Did not like Movantik. Taking Linzaess 290 and MiraLAX once daily along with Colace. Does not feel she always evacuates; she only she goes 3-4 times weekly. Nexium 40 mg daily controlling reflux symptoms very well.  Past Medical History:  Diagnosis Date  . Atrial fibrillation (HCC)    Paroxysmal  . Bladder dysfunction    Self urinary catheterization  . COPD (chronic obstructive pulmonary disease) ([place])   . DDD (degenerative disc disease)   . Depression   . Esophageal spasm    NTG and Norvasc  . Essential hypertension, benign   . Fibromyalgia   . Gastroparesis   . GERD (gastroesophageal reflux disease)   . History of cardiac catheterization    Normal coronary arteries 11/12  . History of pituitary tumor   . Irritable bowel syndrome   . Lymphedema   . Osteoarthritis   . Sleep apnea    BIPAP  . Type 2 diabetes mellitus ([place])   . Urinary tract infection     Past Surgical History:  Procedure Laterality Date  . ABDOMINAL HYSTERECTOMY    . ANTERIOR CERVICAL DECOMP/DISCECTOMY FUSION N/A 12/02/2014   Procedure: Cervical five cervical six anterior cervical decompression with fusion interbody prosthesis plating and bone graft;  Surgeon: Newman Pies, MD;  Location: [place] [facility];  Service: Neurosurgery;  Laterality: N/A;  C56 anterior cervical decompression with fusion interbody prosthesis plating and bonegraft  . APPENDECTOMY    . BACK SURGERY    . CARDIAC CATHETERIZATION    . CARDIOVERSION N/A 03/26/2014   Procedure: CARDIOVERSION;  Surgeon: Herminio Commons, MD;  Location: [facility];  Service: Endoscopy;  Laterality: N/A;  . CARDIOVERSION N/A 05/04/2014   Procedure: CARDIOVERSION;  Surgeon: Satira Sark, MD;  Location: [facility];  Service: Cardiovascular;   Laterality: N/A;  . CHOLECYSTECTOMY    . COLONOSCOPY     Approximately 2003. Per medical records, internal hemorrhoids noted  . COLONOSCOPY N/A 06/25/2013   Dr. Gala Romney: Anal canal hemorrhoids-more likely the source of paper hematochezia. Redundant, capacious colon. Multiple colonic polyps-tubular adenoma  . ESOPHAGOGASTRODUODENOSCOPY  10/12/2004   [id number] plaquing on the esophageal mucosa of uncertain significance, not typical of what is seen with candida esophagitis status post KOH brushing for KOH prep and biopsy for histology. Rule out candida esophagitis/eosinophilic esophagitis. Otherwise normal esophagus. Tiny hiatal hernia. Otherwise, normal stomach, normal D1 and D2. Benign biopsy of esophagus, unknown KOH status.   . ESOPHAGOGASTRODUODENOSCOPY N/A 06/25/2013   Dr. Makalah Asberry:mild chronic gastritis  . KNEE SURGERY    . LEFT HEART CATHETERIZATION WITH CORONARY ANGIOGRAM N/A 11/15/2010   Procedure: LEFT HEART CATHETERIZATION WITH CORONARY ANGIOGRAM;  Surgeon: Laverda Page, MD;  [facility] C[facility];  Service: Cardiovascular;  Laterality: N/A;  . LUNG BIOPSY      Prior to Admission medications   Medication Sig Start Date End Date Taking? Authorizing Provider  acidophilus (RISAQUAD) CAPS capsule Take 1 capsule by mouth daily.   Yes Historical Provider, MD  albuterol (PROAIR HFA) 108 (90 BASE) MCG/ACT inhaler Inhale 2 puffs into the lungs every 6 (six) hours as needed for wheezing or shortness of breath.    Yes Historical Provider, MD  albuterol (PROVENTIL) (2.5 MG/3ML) 0.083% nebulizer solution Take 2.5 mg by nebulization every 6 (six) hours as needed for wheezing. For  shortness of breath.   Yes Historical Provider, MD  aspirin EC 81 MG tablet Take 1 tablet (81 mg total) by mouth daily. 09/16/15  Yes Lavina Hamman, MD  atorvastatin (LIPITOR) 10 MG tablet Take 10 mg by mouth at bedtime. 03/12/15  Yes Historical Provider, MD  baclofen (LIORESAL) 20 MG tablet Take 20 mg by mouth 3 (three) times  daily.     Yes Historical Provider, MD  buPROPion (WELLBUTRIN) 75 MG tablet Take 1 tablet (75 mg total) by mouth daily. 06/01/15 05/31/16 Yes Cloria Spring, MD  Cranberry 250 MG CAPS Take 1 capsule by mouth daily.   Yes Historical Provider, MD  diazepam (VALIUM) 5 MG tablet Take 5 mg by mouth every 12 (twelve) hours as needed for anxiety. For anxiety.   Yes Historical Provider, MD  diclofenac sodium (VOLTAREN) 1 % GEL Apply 2-4 g topically 4 (four) times daily as needed. For pain   Yes Historical Provider, MD  dicyclomine (BENTYL) 10 MG capsule Take 1 capsule (10 mg total) by mouth 4 (four) times daily -  before meals and at bedtime. For abdominal cramping and loose stools Patient taking differently: Take 10 mg by mouth 4 (four) times daily as needed. For abdominal cramping and loose stools 06/05/13  Yes Annitta Needs, NP  diltiazem (CARDIZEM CD) 120 MG 24 hr capsule TAKE 1 CAPSULE BY MOUTH EVERY DAY 04/19/15  Yes Satira Sark, MD  docusate sodium (COLACE) 100 MG capsule Take 100 mg by mouth 2 (two) times daily.   Yes Historical Provider, MD  flecainide (TAMBOCOR) 50 MG tablet TAKE 2 TABLETS BY MOUTH TWICE DAILY 07/12/15  Yes Satira Sark, MD  furosemide (LASIX) 80 MG tablet Take 80 mg by mouth 2 (two) times daily.     Yes Historical Provider, MD  gabapentin (NEURONTIN) 600 MG tablet Take 600 mg by mouth 4 (four) times daily.   Yes Historical Provider, MD  glimepiride (AMARYL) 2 MG tablet Take 2-4 mg by mouth 2 (two) times daily. 4mg  in the morning and 2mg  in the evening 05/21/15  Yes Historical Provider, MD  hydrocortisone (PROCTOSOL HC) 2.5 % rectal cream Place 1 application rectally 2 (two) times daily. Patient taking differently: Place 1 application rectally daily as needed for hemorrhoids.  08/20/13  Yes Annitta Needs, NP  isosorbide mononitrate (IMDUR) 120 MG 24 hr tablet Take 120 mg by mouth every morning.    Yes Historical Provider, MD  isosorbide mononitrate (IMDUR) 30 MG 24 hr tablet Take  30 mg by mouth every evening.    Yes Historical Provider, MD  linaclotide (LINZESS) 290 MCG CAPS capsule Take 290 mcg by mouth daily before breakfast.   Yes Historical Provider, MD  lisinopril (PRINIVIL,ZESTRIL) 2.5 MG tablet Take 2.5 mg by mouth at bedtime.  10/29/12  Yes Historical Provider, MD  loratadine (CLARITIN) 10 MG tablet Take 10 mg by mouth daily.     Yes Historical Provider, MD  meclizine (ANTIVERT) 25 MG tablet Take 25 mg by mouth 3 (three) times daily as needed for dizziness.  07/31/13  Yes Historical Provider, MD  metFORMIN (GLUCOPHAGE) 500 MG tablet Take 1,000 mg by mouth 2 (two) times daily.  05/18/13  Yes Historical Provider, MD  methocarbamol (ROBAXIN) 500 MG tablet Take 1 tablet (500 mg total) by mouth every 8 (eight) hours as needed for muscle spasms. 09/15/15  Yes Lavina Hamman, MD  Multiple Vitamin (MULTIVITAMIN WITH MINERALS) TABS tablet Take 1 tablet by mouth daily.  Yes Historical Provider, MD  NEXIUM 40 MG capsule Take 1 capsule (40 mg total) by mouth daily at 12 noon. 09/23/15  Yes Carlis Stable, NP  nitroGLYCERIN (NITROLINGUAL) 0.4 MG/SPRAY spray PLACE 1 SPRAY UNDER THE TONGUE AS NEEDED. FOR CHEST PAIN 06/18/15  Yes Satira Sark, MD  NON FORMULARY BIPAP - STARTED ABOUT 1 1/2 years AGO.   Yes Historical Provider, MD  NUCYNTA ER 150 MG TB12  12/05/15  Yes Historical Provider, MD  nystatin (MYCOSTATIN/NYSTOP) 100000 UNIT/GM POWD Apply 1 g topically daily.    Yes Historical Provider, MD  omega-3 acid ethyl esters (LOVAZA) 1 g capsule Take 1 capsule by mouth 4 (four) times daily. 09/13/15  Yes Historical Provider, MD  ondansetron (ZOFRAN) 4 MG tablet TAKE 1 TABLET BY MOUTH EVERY 8 HOURS AS NEEDED FOR NAUSEA OR VOMITING 09/15/15  Yes Mahala Menghini, PA-C  OPANA ER, CRUSH RESISTANT, 20 MG T12A Take 1 tablet by mouth 2 (two) times daily. 10/07/15  Yes Historical Provider, MD  oxyCODONE-acetaminophen (PERCOCET) 10-325 MG per tablet Take 1 tablet by mouth every 6 (six) hours as needed.  For pain.   Yes Historical Provider, MD  polyethylene glycol powder (GLYCOLAX/MIRALAX) powder Take 17 g by mouth daily.  08/25/13  Yes Historical Provider, MD  potassium chloride SA (K-DUR,KLOR-CON) 20 MEQ tablet Take 40 mEq by mouth 4 (four) times daily.     Yes Historical Provider, MD  PRESCRIPTION MEDICATION Apply 1 application topically daily as needed (pain). COMPOUNDED MEDICATION FROM [name]. -- C-DOXEPIN-CYCLOBEN-GABAPENT-IBUPROFEN-LIDO 1.9-2-2-2-2.25% Grams   Yes Historical Provider, MD  ranitidine (ZANTAC) 150 MG tablet Take 150 mg by mouth daily as needed for heartburn.    Yes Historical Provider, MD  Rivaroxaban (XARELTO) 20 MG TABS Take 20 mg by mouth every evening.    Yes Historical Provider, MD  spironolactone (ALDACTONE) 50 MG tablet Take 50 mg by mouth 3 (three) times daily.     Yes Historical Provider, MD  morphine (MS CONTIN) 15 MG 12 hr tablet Take 1 tablet by mouth 3 (three) times daily. Takes with the 15 mg. 09/09/15   Historical Provider, MD    Allergies as of 12/14/2015 - Review Complete 12/14/2015  Allergen Reaction Noted  . Hyoscyamine sulfate Other (See Comments)   . Reglan [metoclopramide] Other (See Comments) 11/05/2014    Family History  Problem Relation Age of Onset  . Coronary artery disease Father     Died age 64  . Heart attack Father   . Arrhythmia Father     AF  . Diabetes Father   . Parkinson's disease Father   . Arrhythmia Mother     AF  . Stroke Mother   . Dementia Mother   . Cancer Mother     UTERINE   . Parkinson's disease Mother   . Arrhythmia Brother     AF  . Colon cancer Maternal Grandfather   . Colon cancer Paternal Aunt   . Colon cancer Paternal Aunt   . Diabetes Son   . Cancer Sister     BREAST   . Depression Sister   . Depression Sister   . Depression Sister     Social History   Social History  . Marital status: Divorced    Spouse name: N/A  . Number of children: N/A  . Years of education: N/A   Occupational History    . Not on file.   Social History Main Topics  . Smoking status: Former Smoker  Packs/day: 1.50    Years: 30.00    Types: Cigarettes    Start date: 03/06/1977    Quit date: 01/03/2007  . Smokeless tobacco: Never Used  . Alcohol use No  . Drug use: No  . Sexual activity: No   Other Topics Concern  . Not on file   Social History Narrative  . No narrative on file    Review of Systems: See HPI, otherwise negative ROS  Physical Exam: BP 128/67   Pulse 87   Temp 98.3 F (36.8 C) (Oral)   Ht 5\' 9"  (1.753 m)   Wt (!) 305 lb 3.2 oz (138.4 kg)   BMI 45.07 kg/m  General:   Alert, chronically ill-appearing, pleasant and cooperative in NAD Skin:  Intact without significant lesions or rashes. Eyes:  Sclera clear, no icterus.   Conjunctiva pink. Ears:  Normal auditory acuity. Lungs:  Clear throughout to auscultation.   No wheezes, crackles, or rhonchi. No acute distress. Heart:  Regular rate and rhythm; no murmurs, clicks, rubs,  or gallops. Abdomen: Non-distended, normal bowel sounds.  Soft and nontender without appreciable mass or hepatosplenomegaly.  Pulses:  Normal pulses noted. Extremities:  Without clubbing or edema.  Impression/Plan:  Pleasant lady with opioid-induced constipation. Did not like Movantik. Moving bowels fairly well. I feel she could do better with a fiber supplement on top of Colace, Miralax and Linzess. GERD continues be well controlled on Nexium daily.   Recommendations:   Continue Linzess 290 daily  Continue Colace daily  Increase Miralax to twice daily  Benefiber 1 tablespoon twice daily  Keep a stool diary  Office visit in 6 weeks  Continue Nexium 40 mg daily  Call us in 2 weeks to give [place] a progress report      Notice: This dictation was prepared with Dragon dictation along with smaller phrase technology. Any transcriptional errors that result from this process are unintentional and may not be corrected upon review.

## 2015-12-17 DIAGNOSIS — E119 Type 2 diabetes mellitus without complications: Secondary | ICD-10-CM | POA: Diagnosis not present

## 2015-12-17 DIAGNOSIS — I10 Essential (primary) hypertension: Secondary | ICD-10-CM | POA: Diagnosis not present

## 2015-12-17 DIAGNOSIS — I2510 Atherosclerotic heart disease of native coronary artery without angina pectoris: Secondary | ICD-10-CM | POA: Diagnosis not present

## 2015-12-21 ENCOUNTER — Ambulatory Visit
Admission: RE | Admit: 2015-12-21 | Discharge: 2015-12-21 | Disposition: A | Discharge status: Home / Self Care | Payer: Medicare Other | Source: Ambulatory Visit

## 2015-12-21 DIAGNOSIS — Z1231 Encounter for screening mammogram for malignant neoplasm of breast: Secondary | ICD-10-CM

## 2015-12-23 DIAGNOSIS — E1142 Type 2 diabetes mellitus with diabetic polyneuropathy: Secondary | ICD-10-CM | POA: Diagnosis not present

## 2015-12-23 DIAGNOSIS — B351 Tinea unguium: Secondary | ICD-10-CM | POA: Diagnosis not present

## 2015-12-23 DIAGNOSIS — L84 Corns and callosities: Secondary | ICD-10-CM | POA: Diagnosis not present

## 2015-12-29 DIAGNOSIS — Z713 Dietary counseling and surveillance: Secondary | ICD-10-CM | POA: Diagnosis not present

## 2015-12-29 DIAGNOSIS — E1142 Type 2 diabetes mellitus with diabetic polyneuropathy: Secondary | ICD-10-CM | POA: Diagnosis not present

## 2015-12-29 DIAGNOSIS — Z6841 Body Mass Index (BMI) 40.0 and over, adult: Secondary | ICD-10-CM | POA: Diagnosis not present

## 2015-12-29 DIAGNOSIS — I4891 Unspecified atrial fibrillation: Secondary | ICD-10-CM | POA: Diagnosis not present

## 2015-12-29 DIAGNOSIS — Z299 Encounter for prophylactic measures, unspecified: Secondary | ICD-10-CM | POA: Diagnosis not present

## 2015-12-31 DIAGNOSIS — G894 Chronic pain syndrome: Secondary | ICD-10-CM | POA: Diagnosis not present

## 2015-12-31 DIAGNOSIS — Z79891 Long term (current) use of opiate analgesic: Secondary | ICD-10-CM | POA: Diagnosis not present

## 2015-12-31 DIAGNOSIS — E1142 Type 2 diabetes mellitus with diabetic polyneuropathy: Secondary | ICD-10-CM | POA: Diagnosis not present

## 2016-01-13 DIAGNOSIS — I2510 Atherosclerotic heart disease of native coronary artery without angina pectoris: Secondary | ICD-10-CM | POA: Diagnosis not present

## 2016-01-13 DIAGNOSIS — E119 Type 2 diabetes mellitus without complications: Secondary | ICD-10-CM | POA: Diagnosis not present

## 2016-01-13 DIAGNOSIS — I10 Essential (primary) hypertension: Secondary | ICD-10-CM | POA: Diagnosis not present

## 2016-01-17 DIAGNOSIS — M25562 Pain in left knee: Secondary | ICD-10-CM | POA: Diagnosis not present

## 2016-01-17 DIAGNOSIS — Z299 Encounter for prophylactic measures, unspecified: Secondary | ICD-10-CM | POA: Diagnosis not present

## 2016-01-17 DIAGNOSIS — Z87891 Personal history of nicotine dependence: Secondary | ICD-10-CM | POA: Diagnosis not present

## 2016-01-25 ENCOUNTER — Ambulatory Visit (INDEPENDENT_AMBULATORY_CARE_PROVIDER_SITE_OTHER): Payer: Medicare Other | Admitting: Internal Medicine

## 2016-01-25 VITALS — BP 130/70 | HR 80 | Temp 98.3°F | Ht 69.0 in | Wt 309.8 lb

## 2016-01-25 DIAGNOSIS — K5903 Drug induced constipation: Secondary | ICD-10-CM

## 2016-01-25 DIAGNOSIS — K219 Gastro-esophageal reflux disease without esophagitis: Secondary | ICD-10-CM | POA: Diagnosis not present

## 2016-01-25 NOTE — Patient Instructions (Signed)
Continue Nexium 40 mg daily  Continue Colace, Linzess 290 daily  Increase Miralax to 1/12 capfuls each morning  Office visit with [place] in 6 months

## 2016-01-25 NOTE — Progress Notes (Signed)
Primary Care Physician:  Glenda Chroman, MD Primary Gastroenterologist:  Dr. Gala Romney  Pre-Procedure History & Physical: HPI:  [name] [name] is a 56 y.o. female here for follow-up of GERD and chronic constipation. Narcotic bowel syndrome. History of gastroparesis. On multiple medications including Percocet and Nucynta.  Not taking Benefiber. Is taking Colace MiraLAX and Linzess 290. If she takes 2 capfuls of MiraLAX daily, she has diarrhea; if she takes one capful on top of the above-mentioned regimen she does not have a bowel movement. She has not passed any blood per rectum. Last colonoscopy 2015 marginal preparation. Colonic adenomas. She was slated for 5 year follow-up colonoscopy. She denies rectal bleeding at this time.  Having laparoscopic knee surgery in the near future.  Past Medical History:  Diagnosis Date  . Atrial fibrillation (HCC)    Paroxysmal  . Bladder dysfunction    Self urinary catheterization  . COPD (chronic obstructive pulmonary disease) ([place])   . DDD (degenerative disc disease)   . Depression   . Esophageal spasm    NTG and Norvasc  . Essential hypertension, benign   . Fibromyalgia   . Gastroparesis   . GERD (gastroesophageal reflux disease)   . History of cardiac catheterization    Normal coronary arteries 11/12  . History of pituitary tumor   . Irritable bowel syndrome   . Lymphedema   . Osteoarthritis   . Sleep apnea    BIPAP  . Type 2 diabetes mellitus ([place])   . Urinary tract infection     Past Surgical History:  Procedure Laterality Date  . ABDOMINAL HYSTERECTOMY    . ANTERIOR CERVICAL DECOMP/DISCECTOMY FUSION N/A 12/02/2014   Procedure: Cervical five cervical six anterior cervical decompression with fusion interbody prosthesis plating and bone graft;  Surgeon: Newman Pies, MD;  Location: [place] [facility];  Service: Neurosurgery;  Laterality: N/A;  C56 anterior cervical decompression with fusion interbody prosthesis plating and bonegraft  .  APPENDECTOMY    . BACK SURGERY    . CARDIAC CATHETERIZATION    . CARDIOVERSION N/A 03/26/2014   Procedure: CARDIOVERSION;  Surgeon: Herminio Commons, MD;  Location: [facility];  Service: Endoscopy;  Laterality: N/A;  . CARDIOVERSION N/A 05/04/2014   Procedure: CARDIOVERSION;  Surgeon: Satira Sark, MD;  Location: [facility];  Service: Cardiovascular;  Laterality: N/A;  . CHOLECYSTECTOMY    . COLONOSCOPY     Approximately 2003. Per medical records, internal hemorrhoids noted  . COLONOSCOPY N/A 06/25/2013   Dr. Gala Romney: Anal canal hemorrhoids-more likely the source of paper hematochezia. Redundant, capacious colon. Multiple colonic polyps-tubular adenoma  . ESOPHAGOGASTRODUODENOSCOPY  10/12/2004   [id number] plaquing on the esophageal mucosa of uncertain significance, not typical of what is seen with candida esophagitis status post KOH brushing for KOH prep and biopsy for histology. Rule out candida esophagitis/eosinophilic esophagitis. Otherwise normal esophagus. Tiny hiatal hernia. Otherwise, normal stomach, normal D1 and D2. Benign biopsy of esophagus, unknown KOH status.   . ESOPHAGOGASTRODUODENOSCOPY N/A 06/25/2013   Dr. Litzy Dicker:mild chronic gastritis  . KNEE SURGERY    . LEFT HEART CATHETERIZATION WITH CORONARY ANGIOGRAM N/A 11/15/2010   Procedure: LEFT HEART CATHETERIZATION WITH CORONARY ANGIOGRAM;  Surgeon: Laverda Page, MD;  Location: [facility] CATH LAB;  Service: Cardiovascular;  Laterality: N/A;  . LUNG BIOPSY      Prior to Admission medications   Medication Sig Start Date End Date Taking? Authorizing Provider  acidophilus (RISAQUAD) CAPS capsule Take 1 capsule by mouth daily.   Yes Historical  Provider, MD  albuterol (PROAIR HFA) 108 (90 BASE) MCG/ACT inhaler Inhale 2 puffs into the lungs every 6 (six) hours as needed for wheezing or shortness of breath.    Yes Historical Provider, MD  albuterol (PROVENTIL) (2.5 MG/3ML) 0.083% nebulizer solution Take 2.5 mg by nebulization every 6 (six) hours  as needed for wheezing. For shortness of breath.   Yes Historical Provider, MD  aspirin EC 81 MG tablet Take 1 tablet (81 mg total) by mouth daily. 09/16/15  Yes Lavina Hamman, MD  atorvastatin (LIPITOR) 10 MG tablet Take 10 mg by mouth at bedtime. 03/12/15  Yes Historical Provider, MD  baclofen (LIORESAL) 20 MG tablet Take 20 mg by mouth 3 (three) times daily.     Yes Historical Provider, MD  buPROPion (WELLBUTRIN) 75 MG tablet Take 1 tablet (75 mg total) by mouth daily. 06/01/15 05/31/16 Yes Cloria Spring, MD  Cranberry 250 MG CAPS Take 1 capsule by mouth daily.   Yes Historical Provider, MD  diazepam (VALIUM) 5 MG tablet Take 5 mg by mouth every 12 (twelve) hours as needed for anxiety. For anxiety.   Yes Historical Provider, MD  diclofenac sodium (VOLTAREN) 1 % GEL Apply 2-4 g topically 4 (four) times daily as needed. For pain   Yes Historical Provider, MD  dicyclomine (BENTYL) 10 MG capsule Take 1 capsule (10 mg total) by mouth 4 (four) times daily -  before meals and at bedtime. For abdominal cramping and loose stools Patient taking differently: Take 10 mg by mouth 4 (four) times daily as needed. For abdominal cramping and loose stools 06/05/13  Yes Annitta Needs, NP  diltiazem (CARDIZEM CD) 120 MG 24 hr capsule TAKE 1 CAPSULE BY MOUTH EVERY DAY 04/19/15  Yes Satira Sark, MD  docusate sodium (COLACE) 100 MG capsule Take 100 mg by mouth 2 (two) times daily.   Yes Historical Provider, MD  flecainide (TAMBOCOR) 50 MG tablet TAKE 2 TABLETS BY MOUTH TWICE DAILY 07/12/15  Yes Satira Sark, MD  furosemide (LASIX) 80 MG tablet Take 80 mg by mouth 2 (two) times daily.     Yes Historical Provider, MD  gabapentin (NEURONTIN) 600 MG tablet Take 600 mg by mouth 4 (four) times daily.   Yes Historical Provider, MD  glimepiride (AMARYL) 2 MG tablet Take 2-4 mg by mouth 2 (two) times daily. 4mg  in the morning and 2mg  in the evening 05/21/15  Yes Historical Provider, MD  hydrocortisone (PROCTOSOL HC) 2.5 %  rectal cream Place 1 application rectally 2 (two) times daily. Patient taking differently: Place 1 application rectally daily as needed for hemorrhoids.  08/20/13  Yes Annitta Needs, NP  isosorbide mononitrate (IMDUR) 120 MG 24 hr tablet Take 120 mg by mouth every morning.    Yes Historical Provider, MD  isosorbide mononitrate (IMDUR) 30 MG 24 hr tablet Take 30 mg by mouth every evening.    Yes Historical Provider, MD  linaclotide (LINZESS) 290 MCG CAPS capsule Take 290 mcg by mouth daily before breakfast.   Yes Historical Provider, MD  lisinopril (PRINIVIL,ZESTRIL) 2.5 MG tablet Take 2.5 mg by mouth at bedtime.  10/29/12  Yes Historical Provider, MD  loratadine (CLARITIN) 10 MG tablet Take 10 mg by mouth daily.     Yes Historical Provider, MD  meclizine (ANTIVERT) 25 MG tablet Take 25 mg by mouth 3 (three) times daily as needed for dizziness.  07/31/13  Yes Historical Provider, MD  metFORMIN (GLUCOPHAGE) 500 MG tablet Take 1,000 mg by  mouth 2 (two) times daily.  05/18/13  Yes Historical Provider, MD  methocarbamol (ROBAXIN) 500 MG tablet Take 1 tablet (500 mg total) by mouth every 8 (eight) hours as needed for muscle spasms. 09/15/15  Yes Lavina Hamman, MD  Multiple Vitamin (MULTIVITAMIN WITH MINERALS) TABS tablet Take 1 tablet by mouth daily.   Yes Historical Provider, MD  NEXIUM 40 MG capsule Take 1 capsule (40 mg total) by mouth daily at 12 noon. 09/23/15  Yes Carlis Stable, NP  nitroGLYCERIN (NITROLINGUAL) 0.4 MG/SPRAY spray PLACE 1 SPRAY UNDER THE TONGUE AS NEEDED. FOR CHEST PAIN 06/18/15  Yes Satira Sark, MD  NON FORMULARY BIPAP - STARTED ABOUT 1 1/2 years AGO.   Yes Historical Provider, MD  NUCYNTA ER 150 MG TB12  12/05/15  Yes Historical Provider, MD  nystatin (MYCOSTATIN/NYSTOP) 100000 UNIT/GM POWD Apply 1 g topically daily.    Yes Historical Provider, MD  omega-3 acid ethyl esters (LOVAZA) 1 g capsule Take 1 capsule by mouth 4 (four) times daily. 09/13/15  Yes Historical Provider, MD    ondansetron (ZOFRAN) 4 MG tablet TAKE 1 TABLET BY MOUTH EVERY 8 HOURS AS NEEDED FOR NAUSEA OR VOMITING 09/15/15  Yes Mahala Menghini, PA-C  OPANA ER, CRUSH RESISTANT, 20 MG T12A Take 1 tablet by mouth 2 (two) times daily. 10/07/15  Yes Historical Provider, MD  oxyCODONE-acetaminophen (PERCOCET) 10-325 MG per tablet Take 1 tablet by mouth every 6 (six) hours as needed. For pain.   Yes Historical Provider, MD  polyethylene glycol powder (GLYCOLAX/MIRALAX) powder Take 17 g by mouth daily.  08/25/13  Yes Historical Provider, MD  potassium chloride SA (K-DUR,KLOR-CON) 20 MEQ tablet Take 40 mEq by mouth 4 (four) times daily.     Yes Historical Provider, MD  PRESCRIPTION MEDICATION Apply 1 application topically daily as needed (pain). COMPOUNDED MEDICATION FROM [name]. -- C-DOXEPIN-CYCLOBEN-GABAPENT-IBUPROFEN-LIDO 1.9-2-2-2-2.25% Grams   Yes Historical Provider, MD  ranitidine (ZANTAC) 150 MG tablet Take 150 mg by mouth daily as needed for heartburn.    Yes Historical Provider, MD  Rivaroxaban (XARELTO) 20 MG TABS Take 20 mg by mouth every evening.    Yes Historical Provider, MD  spironolactone (ALDACTONE) 50 MG tablet Take 50 mg by mouth 3 (three) times daily.     Yes Historical Provider, MD  morphine (MS CONTIN) 15 MG 12 hr tablet Take 1 tablet by mouth 3 (three) times daily. Takes with the 15 mg. 09/09/15   Historical Provider, MD    Allergies as of 01/25/2016 - Review Complete 01/25/2016  Allergen Reaction Noted  . Hyoscyamine sulfate Other (See Comments)   . Reglan [metoclopramide] Other (See Comments) 11/05/2014    Family History  Problem Relation Age of Onset  . Coronary artery disease Father     Died age 76  . Heart attack Father   . Arrhythmia Father     AF  . Diabetes Father   . Parkinson's disease Father   . Arrhythmia Mother     AF  . Stroke Mother   . Dementia Mother   . Cancer Mother     UTERINE   . Parkinson's disease Mother   . Arrhythmia Brother     AF  . Colon cancer  Maternal Grandfather   . Colon cancer Paternal Aunt   . Colon cancer Paternal Aunt   . Diabetes Son   . Cancer Sister     BREAST   . Depression Sister   . Depression Sister   . Depression  Sister     Social History   Social History  . Marital status: Divorced    Spouse name: N/A  . Number of children: N/A  . Years of education: N/A   Occupational History  . Not on file.   Social History Main Topics  . Smoking status: Former Smoker    Packs/day: 1.50    Years: 30.00    Types: Cigarettes    Start date: 03/06/1977    Quit date: 01/03/2007  . Smokeless tobacco: Never Used  . Alcohol use No  . Drug use: No  . Sexual activity: No   Other Topics Concern  . Not on file   Social History Narrative  . No narrative on file    Review of Systems: See HPI, otherwise negative ROS  Physical Exam: BP 130/70   Pulse 80   Temp 98.3 F (36.8 C) (Oral)   Ht 5\' 9"  (1.753 m)   Wt (!) 309 lb 12.8 oz (140.5 kg)   BMI 45.75 kg/m  General:   Obese Alert,  Well-developed, well-nourished, pleasant and cooperative in NAD  Impression:  Pleasant morbidly obese 56 year old lady with multiple comorbidities Including GERD and narcotic bowel syndrome. Severe obstipation/constipation. She is exquisitely sensitive to dosing of MiraLAX on top of Colace and Linzess. I think it be best for slowly titrate MiraLAX upward.  GERD symptoms fairly well controlled on Nexium 40 mg daily.  History gastroparesis and polypharmacy likely producing a diffuse GI motility disorder.  Recommendations: Continue Nexium 40 mg daily  Continue Colace, Linzess 290 daily  Increase Miralax to 1/12 capfuls each morning  Office visit with [place] in 6 months   Notice: This dictation was prepared with Dragon dictation along with smaller phrase technology. Any transcriptional errors that result from this process are unintentional and may not be corrected upon review.

## 2016-01-27 DIAGNOSIS — E1142 Type 2 diabetes mellitus with diabetic polyneuropathy: Secondary | ICD-10-CM | POA: Diagnosis not present

## 2016-01-27 DIAGNOSIS — Z79891 Long term (current) use of opiate analgesic: Secondary | ICD-10-CM | POA: Diagnosis not present

## 2016-01-27 DIAGNOSIS — G894 Chronic pain syndrome: Secondary | ICD-10-CM | POA: Diagnosis not present

## 2016-01-28 DIAGNOSIS — M170 Bilateral primary osteoarthritis of knee: Secondary | ICD-10-CM | POA: Diagnosis not present

## 2016-01-28 DIAGNOSIS — M25562 Pain in left knee: Secondary | ICD-10-CM | POA: Diagnosis not present

## 2016-01-31 ENCOUNTER — Other Ambulatory Visit: Payer: Self-pay | Admitting: Cardiology

## 2016-02-04 DIAGNOSIS — M170 Bilateral primary osteoarthritis of knee: Secondary | ICD-10-CM | POA: Diagnosis not present

## 2016-02-08 DIAGNOSIS — E7800 Pure hypercholesterolemia, unspecified: Secondary | ICD-10-CM | POA: Diagnosis not present

## 2016-02-08 DIAGNOSIS — Z299 Encounter for prophylactic measures, unspecified: Secondary | ICD-10-CM | POA: Diagnosis not present

## 2016-02-08 DIAGNOSIS — E1142 Type 2 diabetes mellitus with diabetic polyneuropathy: Secondary | ICD-10-CM | POA: Diagnosis not present

## 2016-02-08 DIAGNOSIS — I10 Essential (primary) hypertension: Secondary | ICD-10-CM | POA: Diagnosis not present

## 2016-02-08 DIAGNOSIS — F329 Major depressive disorder, single episode, unspecified: Secondary | ICD-10-CM | POA: Diagnosis not present

## 2016-02-08 DIAGNOSIS — I4891 Unspecified atrial fibrillation: Secondary | ICD-10-CM | POA: Diagnosis not present

## 2016-02-08 DIAGNOSIS — I2510 Atherosclerotic heart disease of native coronary artery without angina pectoris: Secondary | ICD-10-CM | POA: Diagnosis not present

## 2016-02-11 ENCOUNTER — Telehealth: Payer: Self-pay | Admitting: *Deleted

## 2016-02-11 DIAGNOSIS — M170 Bilateral primary osteoarthritis of knee: Secondary | ICD-10-CM | POA: Diagnosis not present

## 2016-02-11 NOTE — Telephone Encounter (Signed)
[place], PA with [facility] calling to see if okay to hold Xarelto x 48 hours instead of 24 hours.  They did receive previous clearance stating the 24 hours, but notes that the knee patients do better with holding the longer time frame.  Surgery is scheduled for Wednesday.

## 2016-02-11 NOTE — Telephone Encounter (Signed)
Noted. Will fwd message to PA.

## 2016-02-11 NOTE — Telephone Encounter (Signed)
Yes, holding Xarelto for 48 hours prior to surgery is also reasonable.

## 2016-02-16 DIAGNOSIS — M19012 Primary osteoarthritis, left shoulder: Secondary | ICD-10-CM | POA: Diagnosis not present

## 2016-02-16 DIAGNOSIS — M94212 Chondromalacia, left shoulder: Secondary | ICD-10-CM | POA: Diagnosis not present

## 2016-02-16 DIAGNOSIS — G8918 Other acute postprocedural pain: Secondary | ICD-10-CM | POA: Diagnosis not present

## 2016-02-16 DIAGNOSIS — M24112 Other articular cartilage disorders, left shoulder: Secondary | ICD-10-CM | POA: Diagnosis not present

## 2016-02-16 DIAGNOSIS — M7542 Impingement syndrome of left shoulder: Secondary | ICD-10-CM | POA: Diagnosis not present

## 2016-02-16 DIAGNOSIS — M7552 Bursitis of left shoulder: Secondary | ICD-10-CM | POA: Diagnosis not present

## 2016-02-16 DIAGNOSIS — M75112 Incomplete rotator cuff tear or rupture of left shoulder, not specified as traumatic: Secondary | ICD-10-CM | POA: Diagnosis not present

## 2016-02-16 HISTORY — PX: SHOULDER ARTHROSCOPY W/ ROTATOR CUFF REPAIR: SHX2400

## 2016-02-21 ENCOUNTER — Ambulatory Visit: Payer: Medicare Other | Attending: Orthopedic Surgery | Admitting: Physical Therapy

## 2016-02-21 DIAGNOSIS — M25512 Pain in left shoulder: Secondary | ICD-10-CM | POA: Diagnosis not present

## 2016-02-21 DIAGNOSIS — G8929 Other chronic pain: Secondary | ICD-10-CM | POA: Diagnosis not present

## 2016-02-21 DIAGNOSIS — M25612 Stiffness of left shoulder, not elsewhere classified: Secondary | ICD-10-CM | POA: Diagnosis not present

## 2016-02-21 NOTE — Therapy (Signed)
[facility] [place], [place], [address] Phone: [phone]   Fax:  [phone]  Physical Therapy Evaluation  Patient Details  Name: [name] [name] MRN: [id number] Date of Birth: 24-Oct-1960 Referring Provider: Earlie Server MD.  Encounter Date: 02/21/2016      PT End of Session - 02/21/16 1328    Visit Number 1   Number of Visits 16   Date for PT Re-Evaluation 04/21/16   PT Start Time 0108      Past Medical History:  Diagnosis Date  . Atrial fibrillation (HCC)    Paroxysmal  . Bladder dysfunction    Self urinary catheterization  . COPD (chronic obstructive pulmonary disease) ([place])   . DDD (degenerative disc disease)   . Depression   . Esophageal spasm    NTG and Norvasc  . Essential hypertension, benign   . Fibromyalgia   . Gastroparesis   . GERD (gastroesophageal reflux disease)   . History of cardiac catheterization    Normal coronary arteries 11/12  . History of pituitary tumor   . Irritable bowel syndrome   . Lymphedema   . Osteoarthritis   . Sleep apnea    BIPAP  . Type 2 diabetes mellitus ([place])   . Urinary tract infection     Past Surgical History:  Procedure Laterality Date  . ABDOMINAL HYSTERECTOMY    . ANTERIOR CERVICAL DECOMP/DISCECTOMY FUSION N/A 12/02/2014   Procedure: Cervical five cervical six anterior cervical decompression with fusion interbody prosthesis plating and bone graft;  Surgeon: Newman Pies, MD;  Location: [facility]UR[facility] ORS;  Service: Neurosurgery;  Laterality: N/A;  C56 anterior cervical decompression with fusion interbody prosthesis plating and bonegraft  . APPENDECTOMY    . BACK SURGERY    . CARDIAC CATHETERIZATION    . CARDIOVERSION N/A 03/26/2014   Procedure: CARDIOVERSION;  Surgeon: Herminio Commons, MD;  Location: [facility]S;  Service: Endoscopy;  Laterality: N/A;  . CARDIOVERSION N/A 05/04/2014   Procedure: CARDIOVERSION;  Surgeon: Satira Sark, MD;  Location: [facility]  ORS;  Service: Cardiovascular;  Laterality: N/A;  . CHOLECYSTECTOMY    . COLONOSCOPY     Approximately 2003. Per medical records, internal hemorrhoids noted  . COLONOSCOPY N/A 06/25/2013   Dr. Gala Romney: Anal canal hemorrhoids-more likely the source of paper hematochezia. Redundant, capacious colon. Multiple colonic polyps-tubular adenoma  . ESOPHAGOGASTRODUODENOSCOPY  10/12/2004   [id number] plaquing on the esophageal mucosa of uncertain significance, not typical of what is seen with candida esophagitis status post KOH brushing for KOH prep and biopsy for histology. Rule out candida esophagitis/eosinophilic esophagitis. Otherwise normal esophagus. Tiny hiatal hernia. Otherwise, normal stomach, normal D1 and D2. Benign biopsy of esophagus, unknown KOH status.   . ESOPHAGOGASTRODUODENOSCOPY N/A 06/25/2013   Dr. Rourk:mild chronic gastritis  . KNEE SURGERY    . LEFT HEART CATHETERIZATION WITH CORONARY ANGIOGRAM N/A 11/15/2010   Procedure: LEFT HEART CATHETERIZATION WITH CORONARY ANGIOGRAM;  Surgeon: Laverda Page, MD;  [facility];  Service: Cardiovascular;  Laterality: N/A;  . LUNG BIOPSY      There were no vitals filed for this visit.       Subjective Assessment - 02/21/16 1332    Subjective The patient underwent a left shoulder scope on 02/16/16.  She reports her shoulder had hurt for about 1 1/2 years.  Shs is in a sling today and has been doing the pendulum exercise at home.  Her pain-level at rest today is a 6/10 and higher with movement.  Ice, heat, rest and meications decrease pain.   Pertinent History Cervical fusion.   Patient Stated Goals Use left UE without pain.   Currently in Pain? Yes   Pain Score 6    Pain Location Shoulder   Pain Orientation Left   Pain Descriptors / Indicators Aching   Pain Type Chronic pain   Pain Onset More than a month ago   Pain Frequency Constant   Aggravating Factors  See above.   Pain Relieving Factors See above.            [facility]  PT Assessment - 02/21/16 0001      Assessment   Medical Diagnosis Left shoulder scope.   Referring Provider Earlie Server MD.   Onset Date/Surgical Date --  02/16/16 (surgery date).   Next MD Visit --  02/05/16.     Precautions   Precaution Comments Tape allergy.  Begin with left shoulder PAROM; PROM; AAROM.     Restrictions   Weight Bearing Restrictions No     Balance Screen   Has the patient fallen in the past 6 months No   Has the patient had a decrease in activity level because of a fear of falling?  No   Is the patient reluctant to leave their home because of a fear of falling?  No     Home Environment   Living Environment Private residence     Prior Function   Level of Independence Independent     Observation/Other Assessments   Observations --  Stitches in left shoulder scope sites.     Posture/Postural Control   Posture/Postural Control No significant limitations     ROM / Strength   AROM / PROM / Strength PROM     PROM   Overall PROM Comments In supine left shoulder PAROM into flexion= 130 degrees; ER= 68 degrees and IR to abdomen.     Palpation   Palpation comment Tender around left shoulder incisional sites.     Ambulation/Gait   Gait Comments WNL with a cane for safety.                           PT Education - 02/21/16 1327    Education provided Yes   Education Details HEP   Person(s) Educated Patient   Methods Explanation;Demonstration;Tactile cues;Verbal cues;Handout   Comprehension Verbalized understanding;Returned demonstration;Verbal cues required;Tactile cues required;Need further instruction          PT Short Term Goals - 02/21/16 1532      PT SHORT TERM GOAL #1   Title STG's=LTG's.           PT Long Term Goals - 02/21/16 1533      PT LONG TERM GOAL #1   Title Independent with an HEP.   Time 8   Period Weeks   Status New     PT LONG TERM GOAL #2   Title Active left shoulder flexion to 155 degrees so the  patient can easily reach overhead   Time 8   Period Weeks   Status New     PT LONG TERM GOAL #3   Title Active ER to 80 degrees+ to allow for easily donning/doffing of apparel.   Time 8   Period Weeks   Status New     PT LONG TERM GOAL #4   Title Increase ROM so patient is able to reach behind back to L3.   Time 8   Period Weeks  Status New     PT LONG TERM GOAL #5   Title Increase left shoulder strength to a solid 4+/5 to increase stability for performance of functional activities               Plan - 02/21/16 1513    Clinical Impression Statement The patient is doing very well following a left shoulder scope performed on 02/16/16.  She lacks range of motion into flexion and ER as expected.  She was instructed in 2 additional exercises (she is already doing the pendulum exercise).  She will benefit from skilled physical to increase ROM and eventual progression into PRE's.   Rehab Potential Excellent   PT Frequency 2x / week   PT Duration 8 weeks   PT Treatment/Interventions ADLs/Self Care Home Management;Cryotherapy;Electrical Stimulation;Therapeutic activities;Therapeutic exercise;Patient/family education;Manual techniques;Passive range of motion;Vasopneumatic Device   PT Next Visit Plan Begin with left shoulder PAROM; PROM; AAROM.  Progrss to pulleys and UE ranger; isometrics and [place].  Modalities for pain control.      Patient will benefit from skilled therapeutic intervention in order to improve the following deficits and impairments:  Pain, Decreased activity tolerance, Decreased range of motion  Visit Diagnosis: Chronic left shoulder pain - Plan: PT plan of care cert/re-cert  Stiffness of left shoulder, not elsewhere classified - Plan: PT plan of care cert/re-cert      G-Codes - [id number] [id number]    Functional Limitation Carrying, moving and handling objects   Carrying, Moving and Handling Objects Current Status [id number]) At least 60 percent but less than 80 percent  impaired, limited or restricted   Carrying, Moving and Handling Objects Goal Status [id number]) At least 20 percent but less than 40 percent impaired, limited or restricted       Problem List Patient Active Problem List   Diagnosis Date Noted  . Chest pain 09/15/2015  . Fibromyalgia   . Chronic obstructive pulmonary disease ([place])   . Constipation 08/16/2015  . Nausea without vomiting 03/25/2015  . GERD (gastroesophageal reflux disease) 03/25/2015  . Depression 01/05/2015  . Cervical spondylosis with radiculopathy 12/02/2014  . Rectal bleeding 08/22/2013  . History of melena 06/05/2013  . Irritable bowel syndrome 06/05/2013  . Precordial pain 02/06/2012  . Paroxysmal atrial fibrillation ([place]) 09/28/2011  . Essential hypertension, benign 09/28/2011  . Obstructive sleep apnea 10/02/2007  . COPD 10/02/2007  . Gastroparesis due to DM ([place]) 10/02/2007    [place], [place] MPT 02/21/2016, 3:43 PM  [facility] [address] [place], [place], [address] Phone: [phone]   Fax:  [phone]  Name: [name] [name] MRN: [id number] Date of Birth: 13-Jul-1960

## 2016-02-23 ENCOUNTER — Ambulatory Visit: Payer: Medicare Other | Admitting: Physical Therapy

## 2016-02-23 DIAGNOSIS — M25612 Stiffness of left shoulder, not elsewhere classified: Secondary | ICD-10-CM | POA: Diagnosis not present

## 2016-02-23 DIAGNOSIS — G8929 Other chronic pain: Secondary | ICD-10-CM

## 2016-02-23 DIAGNOSIS — M25512 Pain in left shoulder: Secondary | ICD-10-CM | POA: Diagnosis not present

## 2016-02-23 NOTE — Therapy (Signed)
[facility] [place], [place], [address] Phone: [phone]   Fax:  [phone]  Physical Therapy Treatment  Patient Details  Name: [name] [name] MRN: [id number] Date of Birth: 07/29/1960 Referring Provider: Earlie Server MD.  Encounter Date: 02/23/2016      PT End of Session - 02/23/16 1757    Visit Number 2   Number of Visits 16   Date for PT Re-Evaluation 04/21/16   PT Start Time 0237   PT Stop Time 0325   PT Time Calculation (min) 48 min   Activity Tolerance Patient tolerated treatment well   Behavior During Therapy [facility] for tasks assessed/performed      Past Medical History:  Diagnosis Date  . Atrial fibrillation (HCC)    Paroxysmal  . Bladder dysfunction    Self urinary catheterization  . COPD (chronic obstructive pulmonary disease) ([place])   . DDD (degenerative disc disease)   . Depression   . Esophageal spasm    NTG and Norvasc  . Essential hypertension, benign   . Fibromyalgia   . Gastroparesis   . GERD (gastroesophageal reflux disease)   . History of cardiac catheterization    Normal coronary arteries 11/12  . History of pituitary tumor   . Irritable bowel syndrome   . Lymphedema   . Osteoarthritis   . Sleep apnea    BIPAP  . Type 2 diabetes mellitus ([place])   . Urinary tract infection     Past Surgical History:  Procedure Laterality Date  . ABDOMINAL HYSTERECTOMY    . ANTERIOR CERVICAL DECOMP/DISCECTOMY FUSION N/A 12/02/2014   Procedure: Cervical five cervical six anterior cervical decompression with fusion interbody prosthesis plating and bone graft;  Surgeon: Newman Pies, MD;  Location: [facility] NEURO ORS;  Service: Neurosurgery;  Laterality: N/A;  C56 anterior cervical decompression with fusion interbody prosthesis plating and bonegraft  . APPENDECTOMY    . BACK SURGERY    . CARDIAC CATHETERIZATION    . CARDIOVERSION N/A 03/26/2014   Procedure: CARDIOVERSION;  Surgeon: Herminio Commons,  MD;  Location: [facility] ORS;  Service: Endoscopy;  Laterality: N/A;  . CARDIOVERSION N/A 05/04/2014   Procedure: CARDIOVERSION;  Surgeon: Satira Sark, MD;  Location: [facility] ORS;  Service: Cardiovascular;  Laterality: N/A;  . CHOLECYSTECTOMY    . COLONOSCOPY     Approximately 2003. Per medical records, internal hemorrhoids noted  . COLONOSCOPY N/A 06/25/2013   Dr. Gala Romney: Anal canal hemorrhoids-more likely the source of paper hematochezia. Redundant, capacious colon. Multiple colonic polyps-tubular adenoma  . ESOPHAGOGASTRODUODENOSCOPY  10/12/2004   [id number] plaquing on the esophageal mucosa of uncertain significance, not typical of what is seen with candida esophagitis status post KOH brushing for KOH prep and biopsy for histology. Rule out candida esophagitis/eosinophilic esophagitis. Otherwise normal esophagus. Tiny hiatal hernia. Otherwise, normal stomach, normal D1 and D2. Benign biopsy of esophagus, unknown KOH status.   . ESOPHAGOGASTRODUODENOSCOPY N/A 06/25/2013   Dr. Rourk:mild chronic gastritis  . KNEE SURGERY    . LEFT HEART CATHETERIZATION WITH CORONARY ANGIOGRAM N/A 11/15/2010   Procedure: LEFT HEART CATHETERIZATION WITH CORONARY ANGIOGRAM;  Surgeon: Laverda Page, MD;  Location: [facility] CATH LAB;  Service: Cardiovascular;  Laterality: N/A;  . LUNG BIOPSY      There were no vitals filed for this visit.      Subjective Assessment - 02/23/16 1758    Subjective I'm probably doing too much at home.   Patient Stated Goals Use left UE without pain.   Pain  Score 6    Pain Location Shoulder   Pain Orientation Left   Pain Descriptors / Indicators Aching   Pain Type Chronic pain   Pain Onset More than a month ago    Treatment:  In supine gentle passive left shoulder range of motion into flexion and ER in plane of scapula and rhy stab at 90 degrees (23 minutes total) f/b HMP and IFC x 15 minutes.  Excellent job today.                               PT Short Term  Goals - 02/21/16 1532      PT SHORT TERM GOAL #1   Title STG's=LTG's.           PT Long Term Goals - 02/21/16 1533      PT LONG TERM GOAL #1   Title Independent with an HEP.   Time 8   Period Weeks   Status New     PT LONG TERM GOAL #2   Title Active left shoulder flexion to 155 degrees so the patient can easily reach overhead   Time 8   Period Weeks   Status New     PT LONG TERM GOAL #3   Title Active ER to 80 degrees+ to allow for easily donning/doffing of apparel.   Time 8   Period Weeks   Status New     PT LONG TERM GOAL #4   Title Increase ROM so patient is able to reach behind back to L3.   Time 8   Period Weeks   Status New     PT LONG TERM GOAL #5   Title Increase left shoulder strength to a solid 4+/5 to increase stability for performance of functional activities             Patient will benefit from skilled therapeutic intervention in order to improve the following deficits and impairments:  Pain, Decreased activity tolerance, Decreased range of motion  Visit Diagnosis: Chronic left shoulder pain  Stiffness of left shoulder, not elsewhere classified     Problem List Patient Active Problem List   Diagnosis Date Noted  . Chest pain 09/15/2015  . Fibromyalgia   . Chronic obstructive pulmonary disease ([place])   . Constipation 08/16/2015  . Nausea without vomiting 03/25/2015  . GERD (gastroesophageal reflux disease) 03/25/2015  . Depression 01/05/2015  . Cervical spondylosis with radiculopathy 12/02/2014  . Rectal bleeding 08/22/2013  . History of melena 06/05/2013  . Irritable bowel syndrome 06/05/2013  . Precordial pain 02/06/2012  . Paroxysmal atrial fibrillation ([place]) 09/28/2011  . Essential hypertension, benign 09/28/2011  . Obstructive sleep apnea 10/02/2007  . COPD 10/02/2007  . Gastroparesis due to DM ([place]) 10/02/2007    Layci Stenglein, [place] MPT 02/23/2016, 6:00 PM  [facility] [address] [place], [place], [address] Phone: [phone]   Fax:  [phone]  Name: [name] [name] MRN: [id number] Date of Birth: 1961-01-02

## 2016-02-24 DIAGNOSIS — M47816 Spondylosis without myelopathy or radiculopathy, lumbar region: Secondary | ICD-10-CM | POA: Diagnosis not present

## 2016-02-24 DIAGNOSIS — Z79891 Long term (current) use of opiate analgesic: Secondary | ICD-10-CM | POA: Diagnosis not present

## 2016-02-24 DIAGNOSIS — G894 Chronic pain syndrome: Secondary | ICD-10-CM | POA: Diagnosis not present

## 2016-02-24 DIAGNOSIS — E1142 Type 2 diabetes mellitus with diabetic polyneuropathy: Secondary | ICD-10-CM | POA: Diagnosis not present

## 2016-02-25 DIAGNOSIS — M25512 Pain in left shoulder: Secondary | ICD-10-CM | POA: Diagnosis not present

## 2016-02-28 ENCOUNTER — Other Ambulatory Visit: Payer: Self-pay | Admitting: Nurse Practitioner

## 2016-02-28 DIAGNOSIS — I10 Essential (primary) hypertension: Secondary | ICD-10-CM | POA: Diagnosis not present

## 2016-02-28 DIAGNOSIS — I2510 Atherosclerotic heart disease of native coronary artery without angina pectoris: Secondary | ICD-10-CM | POA: Diagnosis not present

## 2016-02-28 DIAGNOSIS — E119 Type 2 diabetes mellitus without complications: Secondary | ICD-10-CM | POA: Diagnosis not present

## 2016-02-29 ENCOUNTER — Encounter: Payer: Self-pay | Admitting: Physical Therapy

## 2016-02-29 ENCOUNTER — Ambulatory Visit: Payer: Medicare Other | Admitting: Physical Therapy

## 2016-02-29 DIAGNOSIS — M25612 Stiffness of left shoulder, not elsewhere classified: Secondary | ICD-10-CM | POA: Diagnosis not present

## 2016-02-29 DIAGNOSIS — M25512 Pain in left shoulder: Secondary | ICD-10-CM | POA: Diagnosis not present

## 2016-02-29 DIAGNOSIS — G8929 Other chronic pain: Secondary | ICD-10-CM

## 2016-02-29 NOTE — Therapy (Signed)
[facility] [place], [place], [address] Phone: [phone]   Fax:  [phone]  Physical Therapy Treatment  Patient Details  Name: [name] [name] MRN: [id number] Date of Birth: 1960-03-17 Referring Provider: Earlie Server MD.  Encounter Date: 02/29/2016      PT End of Session - 02/29/16 1439    Visit Number 3   Number of Visits 16   Date for PT Re-Evaluation 04/21/16   PT Start Time [id number]   PT Stop Time 1527   PT Time Calculation (min) 48 min   Activity Tolerance Patient tolerated treatment well   Behavior During Therapy [facility] for tasks assessed/performed      Past Medical History:  Diagnosis Date  . Atrial fibrillation (HCC)    Paroxysmal  . Bladder dysfunction    Self urinary catheterization  . COPD (chronic obstructive pulmonary disease) ([place])   . DDD (degenerative disc disease)   . Depression   . Esophageal spasm    NTG and Norvasc  . Essential hypertension, benign   . Fibromyalgia   . Gastroparesis   . GERD (gastroesophageal reflux disease)   . History of cardiac catheterization    Normal coronary arteries 11/12  . History of pituitary tumor   . Irritable bowel syndrome   . Lymphedema   . Osteoarthritis   . Sleep apnea    BIPAP  . Type 2 diabetes mellitus ([place])   . Urinary tract infection     Past Surgical History:  Procedure Laterality Date  . ABDOMINAL HYSTERECTOMY    . ANTERIOR CERVICAL DECOMP/DISCECTOMY FUSION N/A 12/02/2014   Procedure: Cervical five cervical six anterior cervical decompression with fusion interbody prosthesis plating and bone graft;  Surgeon: Newman Pies, MD;  Location: [facility] NEURO ORS;  Service: Neurosurgery;  Laterality: N/A;  C56 anterior cervical decompression with fusion interbody prosthesis plating and bonegraft  . APPENDECTOMY    . BACK SURGERY    . CARDIAC CATHETERIZATION    . CARDIOVERSION N/A 03/26/2014   Procedure: CARDIOVERSION;  Surgeon: Herminio Commons,  MD;  Location: [facility];  Service: Endoscopy;  Laterality: N/A;  . CARDIOVERSION N/A 05/04/2014   Procedure: CARDIOVERSION;  Surgeon: Satira Sark, MD;  Location: [facility] ORS;  Service: Cardiovascular;  Laterality: N/A;  . CHOLECYSTECTOMY    . COLONOSCOPY     Approximately 2003. Per medical records, internal hemorrhoids noted  . COLONOSCOPY N/A 06/25/2013   Dr. Gala Romney: Anal canal hemorrhoids-more likely the source of paper hematochezia. Redundant, capacious colon. Multiple colonic polyps-tubular adenoma  . ESOPHAGOGASTRODUODENOSCOPY  10/12/2004   [id number] plaquing on the esophageal mucosa of uncertain significance, not typical of what is seen with candida esophagitis status post KOH brushing for KOH prep and biopsy for histology. Rule out candida esophagitis/eosinophilic esophagitis. Otherwise normal esophagus. Tiny hiatal hernia. Otherwise, normal stomach, normal D1 and D2. Benign biopsy of esophagus, unknown KOH status.   . ESOPHAGOGASTRODUODENOSCOPY N/A 06/25/2013   Dr. Rourk:mild chronic gastritis  . KNEE SURGERY    . LEFT HEART CATHETERIZATION WITH CORONARY ANGIOGRAM N/A 11/15/2010   Procedure: LEFT HEART CATHETERIZATION WITH CORONARY ANGIOGRAM;  Surgeon: Laverda Page, MD;  Location: [facility] CATH LAB;  Service: Cardiovascular;  Laterality: N/A;  . LUNG BIOPSY      There were no vitals filed for this visit.      Subjective Assessment - 02/29/16 1438    Subjective Reports that MD said she could come out of sling by this Friday. May have over done work around the  house.   Pertinent History Cervical fusion.   Patient Stated Goals Use left UE without pain.   Currently in Pain? Yes   Pain Score 3    Pain Location Shoulder   Pain Orientation Left   Pain Descriptors / Indicators Sore   Pain Type Chronic pain   Pain Onset More than a month ago            [facility] PT Assessment - 02/29/16 0001      Assessment   Medical Diagnosis Left shoulder scope.   Onset Date/Surgical Date 02/16/16      Precautions   Precaution Comments Tape allergy.  Begin with left shoulder PAROM; PROM; AAROM.     Restrictions   Weight Bearing Restrictions No                     [facility] Adult PT Treatment/Exercise - 02/29/16 0001      Modalities   Modalities Electrical Stimulation;Moist Heat     Moist Heat Therapy   Number Minutes Moist Heat 15 Minutes   Moist Heat Location Shoulder     Electrical Stimulation   Electrical Stimulation Location L shoulder    Electrical Stimulation Action IFC   Electrical Stimulation Parameters 1-10 hz x15 min   Electrical Stimulation Goals Pain     Manual Therapy   Manual Therapy Passive ROM   Passive ROM PROM of L shoulder into flexion/ER/IR with gentle holds at end range to promote proper ROM                  PT Short Term Goals - 02/21/16 1532      PT SHORT TERM GOAL #1   Title STG's=LTG's.           PT Long Term Goals - 02/21/16 1533      PT LONG TERM GOAL #1   Title Independent with an HEP.   Time 8   Period Weeks   Status New     PT LONG TERM GOAL #2   Title Active left shoulder flexion to 155 degrees so the patient can easily reach overhead   Time 8   Period Weeks   Status New     PT LONG TERM GOAL #3   Title Active ER to 80 degrees+ to allow for easily donning/doffing of apparel.   Time 8   Period Weeks   Status New     PT LONG TERM GOAL #4   Title Increase ROM so patient is able to reach behind back to L3.   Time 8   Period Weeks   Status New     PT LONG TERM GOAL #5   Title Increase left shoulder strength to a solid 4+/5 to increase stability for performance of functional activities               Plan - 02/29/16 1510    Clinical Impression Statement Patient tolerated today's treatment well with only low level L shoulder soreness compared to her normal pain of 7-8/10. Patient arrived to treatment without sling donned as she stated MD told her to wean from sling. Good L shoulder ROM into  flexion/ER/IR with firm end feels noted at end range. Smooth arc of motion noted with all directions of PROM assessed. Normal modalities response noted following removal of the modalities.   Rehab Potential Excellent   PT Frequency 2x / week   PT Duration 8 weeks   PT Treatment/Interventions ADLs/Self Care Home Management;Cryotherapy;Electrical Stimulation;Therapeutic activities;Therapeutic  exercise;Patient/family education;Manual techniques;Passive range of motion;Vasopneumatic Device   PT Next Visit Plan Begin with left shoulder PAROM; PROM; AAROM.  Progrss to pulleys and UE ranger; isometrics and [place].  Modalities for pain control.   Consulted and Agree with Plan of Care Patient      Patient will benefit from skilled therapeutic intervention in order to improve the following deficits and impairments:  Pain, Decreased activity tolerance, Decreased range of motion  Visit Diagnosis: Chronic left shoulder pain  Stiffness of left shoulder, not elsewhere classified     Problem List Patient Active Problem List   Diagnosis Date Noted  . Chest pain 09/15/2015  . Fibromyalgia   . Chronic obstructive pulmonary disease ([place])   . Constipation 08/16/2015  . Nausea without vomiting 03/25/2015  . GERD (gastroesophageal reflux disease) 03/25/2015  . Depression 01/05/2015  . Cervical spondylosis with radiculopathy 12/02/2014  . Rectal bleeding 08/22/2013  . History of melena 06/05/2013  . Irritable bowel syndrome 06/05/2013  . Precordial pain 02/06/2012  . Paroxysmal atrial fibrillation ([place]) 09/28/2011  . Essential hypertension, benign 09/28/2011  . Obstructive sleep apnea 10/02/2007  . COPD 10/02/2007  . Gastroparesis due to DM [facility]) 10/02/2007    Wynelle Fanny, PTA 02/29/2016, 3:55 PM  [facility] [address] [place], [place], [address] Phone: [phone]   Fax:  [phone]  Name: HASTI VAUGHNS MRN: [id number] Date of Birth:  10-27-1960

## 2016-03-02 ENCOUNTER — Encounter: Payer: Self-pay | Admitting: Physical Therapy

## 2016-03-02 ENCOUNTER — Ambulatory Visit: Payer: Medicare Other | Attending: Orthopedic Surgery | Admitting: Physical Therapy

## 2016-03-02 DIAGNOSIS — M25612 Stiffness of left shoulder, not elsewhere classified: Secondary | ICD-10-CM

## 2016-03-02 DIAGNOSIS — M25512 Pain in left shoulder: Secondary | ICD-10-CM | POA: Diagnosis not present

## 2016-03-02 DIAGNOSIS — G8929 Other chronic pain: Secondary | ICD-10-CM

## 2016-03-02 DIAGNOSIS — E1142 Type 2 diabetes mellitus with diabetic polyneuropathy: Secondary | ICD-10-CM | POA: Diagnosis not present

## 2016-03-02 DIAGNOSIS — B351 Tinea unguium: Secondary | ICD-10-CM | POA: Diagnosis not present

## 2016-03-02 DIAGNOSIS — M79676 Pain in unspecified toe(s): Secondary | ICD-10-CM | POA: Diagnosis not present

## 2016-03-02 DIAGNOSIS — L84 Corns and callosities: Secondary | ICD-10-CM | POA: Diagnosis not present

## 2016-03-02 NOTE — Therapy (Signed)
[facility] [place], [place], [address] Phone: [phone]   Fax:  [phone]  Physical Therapy Treatment  Patient Details  Name: [name] [name] MRN: [id number] Date of Birth: 1960-05-29 Referring Provider: Earlie Server MD.  Encounter Date: 03/02/2016      PT End of Session - 03/02/16 1437    Visit Number 4   Number of Visits 16   Date for PT Re-Evaluation 04/21/16   PT Start Time [id number]   PT Stop Time 1524   PT Time Calculation (min) 45 min   Activity Tolerance Patient tolerated treatment well   Behavior During Therapy [facility] for tasks assessed/performed      Past Medical History:  Diagnosis Date  . Atrial fibrillation (HCC)    Paroxysmal  . Bladder dysfunction    Self urinary catheterization  . COPD (chronic obstructive pulmonary disease) ([place])   . DDD (degenerative disc disease)   . Depression   . Esophageal spasm    NTG and Norvasc  . Essential hypertension, benign   . Fibromyalgia   . Gastroparesis   . GERD (gastroesophageal reflux disease)   . History of cardiac catheterization    Normal coronary arteries 11/12  . History of pituitary tumor   . Irritable bowel syndrome   . Lymphedema   . Osteoarthritis   . Sleep apnea    BIPAP  . Type 2 diabetes mellitus ([place])   . Urinary tract infection     Past Surgical History:  Procedure Laterality Date  . ABDOMINAL HYSTERECTOMY    . ANTERIOR CERVICAL DECOMP/DISCECTOMY FUSION N/A 12/02/2014   Procedure: Cervical five cervical six anterior cervical decompression with fusion interbody prosthesis plating and bone graft;  Surgeon: Newman Pies, MD;  Location: [place] NEURO ORS;  Service: Neurosurgery;  Laterality: N/A;  C56 anterior cervical decompression with fusion interbody prosthesis plating and bonegraft  . APPENDECTOMY    . BACK SURGERY    . CARDIAC CATHETERIZATION    . CARDIOVERSION N/A 03/26/2014   Procedure: CARDIOVERSION;  Surgeon: Herminio Commons,  MD;  Location: [facility];  Service: Endoscopy;  Laterality: N/A;  . CARDIOVERSION N/A 05/04/2014   Procedure: CARDIOVERSION;  Surgeon: Satira Sark, MD;  Location: [facility] ORS;  Service: Cardiovascular;  Laterality: N/A;  . CHOLECYSTECTOMY    . COLONOSCOPY     Approximately 2003. Per medical records, internal hemorrhoids noted  . COLONOSCOPY N/A 06/25/2013   Dr. Gala Romney: Anal canal hemorrhoids-more likely the source of paper hematochezia. Redundant, capacious colon. Multiple colonic polyps-tubular adenoma  . ESOPHAGOGASTRODUODENOSCOPY  10/12/2004   [id number] plaquing on the esophageal mucosa of uncertain significance, not typical of what is seen with candida esophagitis status post KOH brushing for KOH prep and biopsy for histology. Rule out candida esophagitis/eosinophilic esophagitis. Otherwise normal esophagus. Tiny hiatal hernia. Otherwise, normal stomach, normal D1 and D2. Benign biopsy of esophagus, unknown KOH status.   . ESOPHAGOGASTRODUODENOSCOPY N/A 06/25/2013   Dr. Rourk:mild chronic gastritis  . KNEE SURGERY    . LEFT HEART CATHETERIZATION WITH CORONARY ANGIOGRAM N/A 11/15/2010   Procedure: LEFT HEART CATHETERIZATION WITH CORONARY ANGIOGRAM;  Surgeon: Laverda Page, MD;  Location: [facility] C[facility] LAB;  Service: Cardiovascular;  Laterality: N/A;  . LUNG BIOPSY      There were no vitals filed for this visit.      Subjective Assessment - 03/02/16 1437    Subjective Reports more discomfort today and yesterday which she correlates to rainy weather. States that intermittantly she has tightness from her  L shoulder into her neck.   Pertinent History Cervical fusion.   Patient Stated Goals Use left UE without pain.   Currently in Pain? Yes   Pain Score 3    Pain Location Shoulder   Pain Orientation Left   Pain Descriptors / Indicators Sore;Aching   Pain Type Chronic pain   Pain Onset More than a month ago            [facility] PT Assessment - 03/02/16 0001      Assessment   Medical  Diagnosis Left shoulder scope.   Onset Date/Surgical Date 02/16/16   Next MD Visit 03/24/2016     Precautions   Precaution Comments Tape allergy.  Begin with left shoulder PAROM; PROM; AAROM.     Restrictions   Weight Bearing Restrictions No                     [facility] Adult PT Treatment/Exercise - 03/02/16 0001      Modalities   Modalities Electrical Stimulation;Moist Heat     Moist Heat Therapy   Number Minutes Moist Heat 15 Minutes   Moist Heat Location Shoulder     Electrical Stimulation   Electrical Stimulation Location L shoulder    Electrical Stimulation Action IFC   Electrical Stimulation Parameters 1-10 hz x15 min   Electrical Stimulation Goals Pain     Manual Therapy   Manual Therapy Passive ROM   Passive ROM PROM of L shoulder into flexion/ER with gentle holds at end range to promote proper ROM                  PT Short Term Goals - 02/21/16 1532      PT SHORT TERM GOAL #1   Title STG's=LTG's.           PT Long Term Goals - 02/21/16 1533      PT LONG TERM GOAL #1   Title Independent with an HEP.   Time 8   Period Weeks   Status New     PT LONG TERM GOAL #2   Title Active left shoulder flexion to 155 degrees so the patient can easily reach overhead   Time 8   Period Weeks   Status New     PT LONG TERM GOAL #3   Title Active ER to 80 degrees+ to allow for easily donning/doffing of apparel.   Time 8   Period Weeks   Status New     PT LONG TERM GOAL #4   Title Increase ROM so patient is able to reach behind back to L3.   Time 8   Period Weeks   Status New     PT LONG TERM GOAL #5   Title Increase left shoulder strength to a solid 4+/5 to increase stability for performance of functional activities               Plan - 03/02/16 1511    Clinical Impression Statement Patient tolerated today's treatment well although she did experience discomfort with end range L shoulder flexion initially. Firm end feels and  smooth arc of motion noted with PROM of L shoulder into flexion and ER. Sling not donned upon arrival at clinic. Patient had mentioned tightness ascending from L shoulder into her neck but no abnormal tightness palpated today. Normal modalities response noted following removal of the modalities.   Rehab Potential Excellent   PT Frequency 2x / week   PT Duration 8 weeks  PT Treatment/Interventions ADLs/Self Care Home Management;Cryotherapy;Electrical Stimulation;Therapeutic activities;Therapeutic exercise;Patient/family education;Manual techniques;Passive range of motion;Vasopneumatic Device   PT Next Visit Plan Begin with left shoulder PAROM; PROM; AAROM.  Progrss to pulleys and UE ranger; isometrics and [place].  Modalities for pain control.   Consulted and Agree with Plan of Care Patient      Patient will benefit from skilled therapeutic intervention in order to improve the following deficits and impairments:  Pain, Decreased activity tolerance, Decreased range of motion  Visit Diagnosis: Chronic left shoulder pain  Stiffness of left shoulder, not elsewhere classified     Problem List Patient Active Problem List   Diagnosis Date Noted  . Chest pain 09/15/2015  . Fibromyalgia   . Chronic obstructive pulmonary disease ([place])   . Constipation 08/16/2015  . Nausea without vomiting 03/25/2015  . GERD (gastroesophageal reflux disease) 03/25/2015  . Depression 01/05/2015  . Cervical spondylosis with radiculopathy 12/02/2014  . Rectal bleeding 08/22/2013  . History of melena 06/05/2013  . Irritable bowel syndrome 06/05/2013  . Precordial pain 02/06/2012  . Paroxysmal atrial fibrillation ([place]) 09/28/2011  . Essential hypertension, benign 09/28/2011  . Obstructive sleep apnea 10/02/2007  . COPD 10/02/2007  . Gastroparesis due to DM [facility]) 10/02/2007    Wynelle Fanny, PTA 03/02/2016, 3:26 PM  [facility] [address] [place], [place],  [address] Phone: [phone]   Fax:  [phone]  Name: [name] [name] MRN: [id number] Date of Birth: May 19, 1960

## 2016-03-06 ENCOUNTER — Encounter: Payer: Self-pay | Admitting: Physical Therapy

## 2016-03-07 DIAGNOSIS — M545 Low back pain: Secondary | ICD-10-CM | POA: Diagnosis not present

## 2016-03-07 DIAGNOSIS — M542 Cervicalgia: Secondary | ICD-10-CM | POA: Diagnosis not present

## 2016-03-09 ENCOUNTER — Encounter: Payer: Self-pay | Admitting: Physical Therapy

## 2016-03-09 ENCOUNTER — Ambulatory Visit: Payer: Medicare Other | Admitting: Physical Therapy

## 2016-03-09 DIAGNOSIS — M25612 Stiffness of left shoulder, not elsewhere classified: Secondary | ICD-10-CM | POA: Diagnosis not present

## 2016-03-09 DIAGNOSIS — M25512 Pain in left shoulder: Secondary | ICD-10-CM | POA: Diagnosis not present

## 2016-03-09 DIAGNOSIS — G8929 Other chronic pain: Secondary | ICD-10-CM

## 2016-03-09 NOTE — Patient Instructions (Signed)
Strengthening: Isometric Flexion   Using wall for resistance, press right fist into ball using light pressure. Hold __5__ seconds. Repeat __10__ times per set. Do __2__ sets per session. Do _2___ sessions per day.  http://orth.exer.us/800   Copyright  VHI. All rights reserved.  Strengthening: Isometric Extension   Using wall for resistance, press back of left arm into ball using light pressure. Hold _5___ seconds. Repeat __10__ times per set. Do __2__ sets per session. Do __2__ sessions per day.  http://orth.exer.us/804   Copyright  VHI. All rights reserved.  Strengthening: Isometric External Rotation   Using wall to provide resistance, and keeping right arm at side, press back of hand into ball using light pressure. Hold __5__ seconds. Repeat __10__ times per set. Do _2___ sets per session. Do __2__ sessions per day.  http://orth.exer.us/814   Copyright  VHI. All rights reserved.  Strengthening: Isometric Internal Rotation   Using door frame for resistance, press palm of right hand into ball using light pressure. Keep elbow in at side. Hold _5___ seconds. Repeat __10__ times per set. Do _2___ sets per session. Do __2__ sessions per day.

## 2016-03-09 NOTE — Therapy (Signed)
[facility] [place], [place], [address] Phone: [phone]   Fax:  [phone]  Physical Therapy Treatment  Patient Details  Name: [name] [name] MRN: [id number] Date of Birth: [birth year] Referring Provider: Earlie Server MD.  Encounter Date: 03/09/2016      PT End of Session - 03/09/16 1416    Visit Number 5   Number of Visits 16   Date for PT Re-Evaluation 04/21/16   PT Start Time 6378   PT Stop Time 1435   PT Time Calculation (min) 42 min   Activity Tolerance Patient tolerated treatment well   Behavior During Therapy [facility] for tasks assessed/performed      Past Medical History:  Diagnosis Date  . Atrial fibrillation (HCC)    Paroxysmal  . Bladder dysfunction    Self urinary catheterization  . COPD (chronic obstructive pulmonary disease) ([place])   . DDD (degenerative disc disease)   . Depression   . Esophageal spasm    NTG and Norvasc  . Essential hypertension, benign   . Fibromyalgia   . Gastroparesis   . GERD (gastroesophageal reflux disease)   . History of cardiac catheterization    Normal coronary arteries 11/12  . History of pituitary tumor   . Irritable bowel syndrome   . Lymphedema   . Osteoarthritis   . Sleep apnea    BIPAP  . Type 2 diabetes mellitus ([place])   . Urinary tract infection     Past Surgical History:  Procedure Laterality Date  . ABDOMINAL HYSTERECTOMY    . ANTERIOR CERVICAL DECOMP/DISCECTOMY FUSION N/A 12/02/2014   Procedure: Cervical five cervical six anterior cervical decompression with fusion interbody prosthesis plating and bone graft;  Surgeon: Newman Pies, MD;  Location: [place] NEURO ORS;  Service: Neurosurgery;  Laterality: N/A;  C56 anterior cervical decompression with fusion interbody prosthesis plating and bonegraft  . APPENDECTOMY    . BACK SURGERY    . CARDIAC CATHETERIZATION    . CARDIOVERSION N/A 03/26/2014   Procedure: CARDIOVERSION;  Surgeon: Herminio Commons,  MD;  Location: [facility];  Service: Endoscopy;  Laterality: N/A;  . CARDIOVERSION N/A 05/04/2014   Procedure: CARDIOVERSION;  Surgeon: Satira Sark, MD;  Location: [facility]S;  Service: Cardiovascular;  Laterality: N/A;  . CHOLECYSTECTOMY    . COLONOSCOPY     Approximately 2003. Per medical records, internal hemorrhoids noted  . COLONOSCOPY N/A 06/25/2013   Dr. Gala Romney: Anal canal hemorrhoids-more likely the source of paper hematochezia. Redundant, capacious colon. Multiple colonic polyps-tubular adenoma  . ESOPHAGOGASTRODUODENOSCOPY  10/12/2004   HYI:FOYD plaquing on the esophageal mucosa of uncertain significance, not typical of what is seen with candida esophagitis status post KOH brushing for KOH prep and biopsy for histology. Rule out candida esophagitis/eosinophilic esophagitis. Otherwise normal esophagus. Tiny hiatal hernia. Otherwise, normal stomach, normal D1 and D2. Benign biopsy of esophagus, unknown KOH status.   . ESOPHAGOGASTRODUODENOSCOPY N/A 06/25/2013   Dr. Rourk:mild chronic gastritis  . KNEE SURGERY    . LEFT HEART CATHETERIZATION WITH CORONARY ANGIOGRAM N/A 11/15/2010   Procedure: LEFT HEART CATHETERIZATION WITH CORONARY ANGIOGRAM;  Surgeon: Laverda Page, MD;  Location: [facility] CATH LAB;  Service: Cardiovascular;  Laterality: N/A;  . LUNG BIOPSY      There were no vitals filed for this visit.      Subjective Assessment - 03/09/16 1355    Subjective Patient reported some ongoing soreness in shoulder. Patient refused some exercises today and would like to try more next week.  Pertinent History Cervical fusion.   Patient Stated Goals Use left UE without pain.   Currently in Pain? Yes   Pain Score 3    Pain Location Shoulder   Pain Orientation Left   Pain Descriptors / Indicators Aching;Sore   Pain Type Chronic pain   Pain Onset More than a month ago   Pain Frequency Constant   Aggravating Factors  any use of shoulder   Pain Relieving Factors at rest             [facility] PT Assessment - 03/09/16 0001      ROM / Strength   AROM / PROM / Strength PROM     PROM   PROM Assessment Site Shoulder   Right/Left Shoulder Left   Left Shoulder Flexion 145 Degrees   Left Shoulder External Rotation 70 Degrees                     [facility] Adult PT Treatment/Exercise - 03/09/16 0001      Exercises   Exercises Shoulder     Shoulder Exercises: Supine   Other Supine Exercises supine cane for chest press and flexion 2x10 each     Moist Heat Therapy   Number Minutes Moist Heat 15 Minutes   Moist Heat Location Shoulder     Electrical Stimulation   Electrical Stimulation Location L shoulder    Electrical Stimulation Action IFC   Electrical Stimulation Parameters 1-10hz  x45min   Electrical Stimulation Goals Pain     Manual Therapy   Manual Therapy Passive ROM   Passive ROM PROM of L shoulder into flexion/ER with gentle holds at end range to promote proper ROM, then rhythmic stabilization for IR/ER in scaption and flex/ext at 90 degrees                PT Education - 03/09/16 1413    Education provided Yes   Education Details HEP isometrics   Person(s) Educated Patient   Methods Explanation;Demonstration;Handout   Comprehension Verbalized understanding;Returned demonstration          PT Short Term Goals - 02/21/16 1532      PT SHORT TERM GOAL #1   Title STG's=LTG's.           PT Long Term Goals - 03/09/16 1417      PT LONG TERM GOAL #1   Title Independent with an HEP.   Time 8   Period Weeks   Status On-going     PT LONG TERM GOAL #2   Title Active left shoulder flexion to 155 degrees so the patient can easily reach overhead   Time 8   Period Weeks   Status On-going     PT LONG TERM GOAL #3   Title Active ER to 80 degrees+ to allow for easily donning/doffing of apparel.   Time 8   Period Weeks   Status On-going     PT LONG TERM GOAL #4   Title Increase ROM so patient is able to reach behind back to L3.    Time 8   Period Weeks   Status On-going     PT LONG TERM GOAL #5   Title Increase left shoulder strength to a solid 4+/5 to increase stability for performance of functional activities   Status On-going               Plan - 03/09/16 1418    Clinical Impression Statement Patient tolerated treatment well today. Patient has improved with manual  P/AAROM today. Patient did not report any pain during exercises. Patient did well with supine cane exercises and requested to hold off on other exercises until next week. Patient was given HEP for isometrics with good understanding. Goals ongoing due to strength and pain defcits.    Rehab Potential Excellent   PT Frequency 2x / week   PT Duration 8 weeks   PT Treatment/Interventions ADLs/Self Care Home Management;Cryotherapy;Electrical Stimulation;Therapeutic activities;Therapeutic exercise;Patient/family education;Manual techniques;Passive range of motion;Vasopneumatic Device   PT Next Visit Plan cont with left shoulder PAROM; PROM; AAROM.  Progrss to pulleys and UE ranger; isometrics and [place].  Modalities for pain control.   Consulted and Agree with Plan of Care Patient      Patient will benefit from skilled therapeutic intervention in order to improve the following deficits and impairments:  Pain, Decreased activity tolerance, Decreased range of motion  Visit Diagnosis: Chronic left shoulder pain  Stiffness of left shoulder, not elsewhere classified     Problem List Patient Active Problem List   Diagnosis Date Noted  . Chest pain 09/15/2015  . Fibromyalgia   . Chronic obstructive pulmonary disease ([place])   . Constipation 08/16/2015  . Nausea without vomiting 03/25/2015  . GERD (gastroesophageal reflux disease) 03/25/2015  . Depression 01/05/2015  . Cervical spondylosis with radiculopathy 12/02/2014  . Rectal bleeding 08/22/2013  . History of melena 06/05/2013  . Irritable bowel syndrome 06/05/2013  . Precordial pain 02/06/2012   . Paroxysmal atrial fibrillation ([place]) 09/28/2011  . Essential hypertension, benign 09/28/2011  . Obstructive sleep apnea 10/02/2007  . COPD 10/02/2007  . Gastroparesis due to DM ([place]) 10/02/2007    Reise Hietala P, PTA 03/09/2016, 2:39 PM  [facility] [place], [place], [address] Phone: [phone]   Fax:  [phone]  Name: ALISSIA LORY MRN: [id number] Date of Birth: Apr 10, 1960

## 2016-03-14 ENCOUNTER — Ambulatory Visit: Payer: Medicare Other | Admitting: Physical Therapy

## 2016-03-14 DIAGNOSIS — G8929 Other chronic pain: Secondary | ICD-10-CM

## 2016-03-14 DIAGNOSIS — M25612 Stiffness of left shoulder, not elsewhere classified: Secondary | ICD-10-CM

## 2016-03-14 DIAGNOSIS — M25512 Pain in left shoulder: Secondary | ICD-10-CM | POA: Diagnosis not present

## 2016-03-14 NOTE — Therapy (Signed)
[facility] [place], [place], [address] Phone: [phone]   Fax:  [phone]  Physical Therapy Treatment  Patient Details  Name: [name] [name] MRN: [id number] Date of Birth: 08-06-1960 Referring Provider: Earlie Server MD.  Encounter Date: 03/14/2016      PT End of Session - 03/14/16 1729    Visit Number 6   Number of Visits 16   Date for PT Re-Evaluation 04/21/16   PT Start Time 0235   PT Stop Time 0322   PT Time Calculation (min) 47 min   Activity Tolerance Patient tolerated treatment well   Behavior During Therapy [facility] for tasks assessed/performed      Past Medical History:  Diagnosis Date  . Atrial fibrillation (HCC)    Paroxysmal  . Bladder dysfunction    Self urinary catheterization  . COPD (chronic obstructive pulmonary disease) ([place])   . DDD (degenerative disc disease)   . Depression   . Esophageal spasm    NTG and Norvasc  . Essential hypertension, benign   . Fibromyalgia   . Gastroparesis   . GERD (gastroesophageal reflux disease)   . History of cardiac catheterization    Normal coronary arteries 11/12  . History of pituitary tumor   . Irritable bowel syndrome   . Lymphedema   . Osteoarthritis   . Sleep apnea    BIPAP  . Type 2 diabetes mellitus ([place])   . Urinary tract infection     Past Surgical History:  Procedure Laterality Date  . ABDOMINAL HYSTERECTOMY    . ANTERIOR CERVICAL DECOMP/DISCECTOMY FUSION N/A 12/02/2014   Procedure: Cervical five cervical six anterior cervical decompression with fusion interbody prosthesis plating and bone graft;  Surgeon: Newman Pies, MD;  Location: [place] NEURO ORS;  Service: Neurosurgery;  Laterality: N/A;  C56 anterior cervical decompression with fusion interbody prosthesis plating and bonegraft  . APPENDECTOMY    . BACK SURGERY    . CARDIAC CATHETERIZATION    . CARDIOVERSION N/A 03/26/2014   Procedure: CARDIOVERSION;  Surgeon: Herminio Commons,  MD;  Location: [facility];  Service: Endoscopy;  Laterality: N/A;  . CARDIOVERSION N/A 05/04/2014   Procedure: CARDIOVERSION;  Surgeon: Satira Sark, MD;  Location: [facility] ORS;  Service: Cardiovascular;  Laterality: N/A;  . CHOLECYSTECTOMY    . COLONOSCOPY     Approximately 2003. Per medical records, internal hemorrhoids noted  . COLONOSCOPY N/A 06/25/2013   Dr. Gala Romney: Anal canal hemorrhoids-more likely the source of paper hematochezia. Redundant, capacious colon. Multiple colonic polyps-tubular adenoma  . ESOPHAGOGASTRODUODENOSCOPY  10/12/2004   WHQ:PRFF plaquing on the esophageal mucosa of uncertain significance, not typical of what is seen with candida esophagitis status post KOH brushing for KOH prep and biopsy for histology. Rule out candida esophagitis/eosinophilic esophagitis. Otherwise normal esophagus. Tiny hiatal hernia. Otherwise, normal stomach, normal D1 and D2. Benign biopsy of esophagus, unknown KOH status.   . ESOPHAGOGASTRODUODENOSCOPY N/A 06/25/2013   Dr. Rourk:mild chronic gastritis  . KNEE SURGERY    . LEFT HEART CATHETERIZATION WITH CORONARY ANGIOGRAM N/A 11/15/2010   Procedure: LEFT HEART CATHETERIZATION WITH CORONARY ANGIOGRAM;  Surgeon: Laverda Page, MD;  Location: [facility] CATH LAB;  Service: Cardiovascular;  Laterality: N/A;  . LUNG BIOPSY      There were no vitals filed for this visit.      Subjective Assessment - 03/14/16 1730    Subjective I'm using my arm too much.   Pain Score 5    Pain Location Shoulder   Pain  Orientation Left   Pain Descriptors / Indicators Aching;Sore   Pain Type Chronic pain   Pain Onset More than a month ago                         [facility] Adult PT Treatment/Exercise - 03/14/16 0001      Exercises   Exercises Shoulder     Shoulder Exercises: Standing   Other Standing Exercises Yellow theraband RW 4.     Shoulder Exercises: Pulleys   Flexion Limitations 5 minutes.   Other Pulley Exercises UE ranger on wall x 5  minutes into flexion and CCW and CW.     Moist Heat Therapy   Number Minutes Moist Heat 15 Minutes   Moist Heat Location --  Left shoulder.     Acupuncturist Location --  Left shoulder.   Electrical Stimulation Action IFC   Electrical Stimulation Parameters 80-150 Hz x 15 minutes.   Electrical Stimulation Goals Pain     Manual Therapy   Manual Therapy Passive ROM   Passive ROM PROM and rhy stabs in supine x 9 minutes to patient's left shoulder.                  PT Short Term Goals - 02/21/16 1532      PT SHORT TERM GOAL #1   Title STG's=LTG's.           PT Long Term Goals - 03/09/16 1417      PT LONG TERM GOAL #1   Title Independent with an HEP.   Time 8   Period Weeks   Status On-going     PT LONG TERM GOAL #2   Title Active left shoulder flexion to 155 degrees so the patient can easily reach overhead   Time 8   Period Weeks   Status On-going     PT LONG TERM GOAL #3   Title Active ER to 80 degrees+ to allow for easily donning/doffing of apparel.   Time 8   Period Weeks   Status On-going     PT LONG TERM GOAL #4   Title Increase ROM so patient is able to reach behind back to L3.   Time 8   Period Weeks   Status On-going     PT LONG TERM GOAL #5   Title Increase left shoulder strength to a solid 4+/5 to increase stability for performance of functional activities   Status On-going               Plan - 03/14/16 1734    Clinical Impression Statement Excellent job with the addition of RW4 exercises using yellow theraband today.      Patient will benefit from skilled therapeutic intervention in order to improve the following deficits and impairments:  Pain, Decreased activity tolerance, Decreased range of motion  Visit Diagnosis: Chronic left shoulder pain  Stiffness of left shoulder, not elsewhere classified     Problem List Patient Active Problem List   Diagnosis Date Noted  . Chest pain  09/15/2015  . Fibromyalgia   . Chronic obstructive pulmonary disease ([place])   . Constipation 08/16/2015  . Nausea without vomiting 03/25/2015  . GERD (gastroesophageal reflux disease) 03/25/2015  . Depression 01/05/2015  . Cervical spondylosis with radiculopathy 12/02/2014  . Rectal bleeding 08/22/2013  . History of melena 06/05/2013  . Irritable bowel syndrome 06/05/2013  . Precordial pain 02/06/2012  . Paroxysmal atrial fibrillation ([place]) 09/28/2011  .  Essential hypertension, benign 09/28/2011  . Obstructive sleep apnea 10/02/2007  . COPD 10/02/2007  . Gastroparesis due to DM ([place]) 10/02/2007    [place], [place] MPT 03/14/2016, 5:37 PM  [facility] [address] [place], [place], [address] Phone: [phone]   Fax:  [phone]  Name: [name] [name] MRN: [id number] Date of Birth: Feb 04, 1960

## 2016-03-16 ENCOUNTER — Ambulatory Visit: Payer: Medicare Other | Admitting: Physical Therapy

## 2016-03-16 ENCOUNTER — Encounter: Payer: Self-pay | Admitting: Physical Therapy

## 2016-03-16 DIAGNOSIS — M25512 Pain in left shoulder: Principal | ICD-10-CM

## 2016-03-16 DIAGNOSIS — G8929 Other chronic pain: Secondary | ICD-10-CM | POA: Diagnosis not present

## 2016-03-16 DIAGNOSIS — M25612 Stiffness of left shoulder, not elsewhere classified: Secondary | ICD-10-CM | POA: Diagnosis not present

## 2016-03-16 NOTE — Therapy (Signed)
[facility] [place], [place], [address] Phone: [phone]   Fax:  [phone]  Physical Therapy Treatment  Patient Details  Name: [name] [name] MRN: [id number] Date of Birth: Oct 18, 1960 Referring Provider: Earlie Server MD.  Encounter Date: 03/16/2016      PT End of Session - 03/16/16 1436    Visit Number 7   Number of Visits 16   Date for PT Re-Evaluation 04/21/16   PT Start Time 9211   PT Stop Time 1450   PT Time Calculation (min) 51 min   Activity Tolerance Patient tolerated treatment well   Behavior During Therapy [facility] for tasks assessed/performed      Past Medical History:  Diagnosis Date  . Atrial fibrillation (HCC)    Paroxysmal  . Bladder dysfunction    Self urinary catheterization  . COPD (chronic obstructive pulmonary disease) ([place])   . DDD (degenerative disc disease)   . Depression   . Esophageal spasm    NTG and Norvasc  . Essential hypertension, benign   . Fibromyalgia   . Gastroparesis   . GERD (gastroesophageal reflux disease)   . History of cardiac catheterization    Normal coronary arteries 11/12  . History of pituitary tumor   . Irritable bowel syndrome   . Lymphedema   . Osteoarthritis   . Sleep apnea    BIPAP  . Type 2 diabetes mellitus ([place])   . Urinary tract infection     Past Surgical History:  Procedure Laterality Date  . ABDOMINAL HYSTERECTOMY    . ANTERIOR CERVICAL DECOMP/DISCECTOMY FUSION N/A 12/02/2014   Procedure: Cervical five cervical six anterior cervical decompression with fusion interbody prosthesis plating and bone graft;  Surgeon: Newman Pies, MD;  Location: [facility];  Service: Neurosurgery;  Laterality: N/A;  C56 anterior cervical decompression with fusion interbody prosthesis plating and bonegraft  . APPENDECTOMY    . BACK SURGERY    . CARDIAC CATHETERIZATION    . CARDIOVERSION N/A 03/26/2014   Procedure: CARDIOVERSION;  Surgeon: Herminio Commons,  MD;  Location: [facility];  Service: Endoscopy;  Laterality: N/A;  . CARDIOVERSION N/A 05/04/2014   Procedure: CARDIOVERSION;  Surgeon: Satira Sark, MD;  Location: [facility];  Service: Cardiovascular;  Laterality: N/A;  . CHOLECYSTECTOMY    . COLONOSCOPY     Approximately 2003. Per medical records, internal hemorrhoids noted  . COLONOSCOPY N/A 06/25/2013   Dr. Gala Romney: Anal canal hemorrhoids-more likely the source of paper hematochezia. Redundant, capacious colon. Multiple colonic polyps-tubular adenoma  . ESOPHAGOGASTRODUODENOSCOPY  10/12/2004   HER:DEYC plaquing on the esophageal mucosa of uncertain significance, not typical of what is seen with candida esophagitis status post KOH brushing for KOH prep and biopsy for histology. Rule out candida esophagitis/eosinophilic esophagitis. Otherwise normal esophagus. Tiny hiatal hernia. Otherwise, normal stomach, normal D1 and D2. Benign biopsy of esophagus, unknown KOH status.   . ESOPHAGOGASTRODUODENOSCOPY N/A 06/25/2013   Dr. Rourk:mild chronic gastritis  . KNEE SURGERY    . LEFT HEART CATHETERIZATION WITH CORONARY ANGIOGRAM N/A 11/15/2010   Procedure: LEFT HEART CATHETERIZATION WITH CORONARY ANGIOGRAM;  Surgeon: Laverda Page, MD;  Location: [facility] CATH LAB;  Service: Cardiovascular;  Laterality: N/A;  . LUNG BIOPSY      There were no vitals filed for this visit.      Subjective Assessment - 03/16/16 1405    Subjective Patient reported doing well after last treatment some soreness upon arrival   Pertinent History Cervical fusion.   Patient  Stated Goals Use left UE without pain.   Currently in Pain? Yes   Pain Score 2    Pain Location Shoulder   Pain Orientation Left   Pain Descriptors / Indicators Aching;Sore   Pain Type Chronic pain   Pain Onset More than a month ago   Pain Frequency Constant   Aggravating Factors  certain movements   Pain Relieving Factors rest            [facility] PT Assessment - 03/16/16 0001      ROM / Strength    AROM / PROM / Strength AROM;PROM     AROM   AROM Assessment Site Shoulder   Right/Left Shoulder Left   Left Shoulder Flexion 135 Degrees   Left Shoulder External Rotation 80 Degrees     PROM   PROM Assessment Site Shoulder   Right/Left Shoulder Left   Left Shoulder Flexion 165 Degrees   Left Shoulder External Rotation 80 Degrees                     [facility] Adult PT Treatment/Exercise - 03/16/16 0001      Shoulder Exercises: Supine   Protraction Strengthening;Left;AROM  3x10   Flexion Strengthening;Left;AROM  2x10     Shoulder Exercises: Sidelying   External Rotation AROM;Strengthening;Left  3x10     Shoulder Exercises: Standing   Protraction Strengthening;Left;Theraband;20 reps   Theraband Level (Shoulder Protraction) Level 1 (Yellow)   External Rotation Strengthening;Left;Theraband;20 reps   Theraband Level (Shoulder External Rotation) Level 1 (Yellow)   Internal Rotation Strengthening;Left;Theraband;20 reps   Theraband Level (Shoulder Internal Rotation) Level 1 (Yellow)   Row Strengthening;Left;20 reps;Theraband   Theraband Level (Shoulder Row) Level 1 (Yellow)     Shoulder Exercises: Pulleys   Flexion Other (comment)  53min   Flexion Limitations standing UE ranger for flexion and circles 2x10 each     Moist Heat Therapy   Number Minutes Moist Heat 15 Minutes   Moist Heat Location Shoulder     Electrical Stimulation   Electrical Stimulation Location L shoulder    Electrical Stimulation Action IFC   Electrical Stimulation Parameters 80-150hz  x19min   Electrical Stimulation Goals Pain     Manual Therapy   Manual Therapy Passive ROM   Passive ROM manual PROM for flexion and ER with gentle holds                PT Education - 03/16/16 1435    Education provided Yes   Education Details HEP RW4 with yellow t-band   Person(s) Educated Patient   Methods Explanation;Demonstration;Handout   Comprehension Verbalized understanding;Returned  demonstration          PT Short Term Goals - 02/21/16 1532      PT SHORT TERM GOAL #1   Title STG's=LTG's.           PT Long Term Goals - 03/16/16 1424      PT LONG TERM GOAL #1   Title Independent with an HEP.   Time 8   Period Weeks   Status On-going     PT LONG TERM GOAL #2   Title Active left shoulder flexion to 155 degrees so the patient can easily reach overhead   Time 8   Period Weeks   Status On-going  AROM 135 degrees 03/16/16     PT LONG TERM GOAL #3   Title Active ER to 80 degrees+ to allow for easily donning/doffing of apparel.   Time 8   Period  Weeks   Status Achieved  AROM 80 degrees 03/16/16     PT LONG TERM GOAL #4   Title Increase ROM so patient is able to reach behind back to L3.   Time 8   Period Weeks   Status On-going     PT LONG TERM GOAL #5   Title Increase left shoulder strength to a solid 4+/5 to increase stability for performance of functional activities   Status On-going               Plan - 03/16/16 1437    Clinical Impression Statement Patient progressing with all activities this week. Patient tolerated treatment well today. Patient has improved ROM for left shoulde rflexion and ER. Patient doing well with Rw4 exercises with good technique and HEP given. Patient has full PROM today yet is limited actively due to strength deficts. Goals progressing.    Rehab Potential Excellent   PT Frequency 2x / week   PT Duration 8 weeks   PT Treatment/Interventions ADLs/Self Care Home Management;Cryotherapy;Electrical Stimulation;Therapeutic activities;Therapeutic exercise;Patient/family education;Manual techniques;Passive range of motion;Vasopneumatic Device   PT Next Visit Plan cont with left shoulder strengthening and modalites PRN   Consulted and Agree with Plan of Care Patient      Patient will benefit from skilled therapeutic intervention in order to improve the following deficits and impairments:  Pain, Decreased activity  tolerance, Decreased range of motion  Visit Diagnosis: Chronic left shoulder pain  Stiffness of left shoulder, not elsewhere classified     Problem List Patient Active Problem List   Diagnosis Date Noted  . Chest pain 09/15/2015  . Fibromyalgia   . Chronic obstructive pulmonary disease ([place])   . Constipation 08/16/2015  . Nausea without vomiting 03/25/2015  . GERD (gastroesophageal reflux disease) 03/25/2015  . Depression 01/05/2015  . Cervical spondylosis with radiculopathy 12/02/2014  . Rectal bleeding 08/22/2013  . History of melena 06/05/2013  . Irritable bowel syndrome 06/05/2013  . Precordial pain 02/06/2012  . Paroxysmal atrial fibrillation ([place]) 09/28/2011  . Essential hypertension, benign 09/28/2011  . Obstructive sleep apnea 10/02/2007  . COPD 10/02/2007  . Gastroparesis due to DM ([place]) 10/02/2007    Analycia Khokhar P, PTA 03/16/2016, 2:53 PM  [facility] [place], [place], [address] Phone: [phone]   Fax:  [phone]  Name: [name] [name] MRN: [id number] Date of Birth: [birth year]

## 2016-03-16 NOTE — Patient Instructions (Signed)
  Strengthening: Resisted Flexion   Hold tubing with left arm at side. Pull forward and up. Move shoulder through pain-free range of motion. Repeat __10__ times per set. Do _2-3___ sets per session. Do _2-3___ sessions per day.  Strengthening: Resisted Extension   Hold tubing in left hand, arm forward. Pull arm back, elbow straight. Repeat __10__ times per set. Do _2-3___ sets per session. Do _2-3___ sessions per day.   Strengthening: Resisted Internal Rotation   Hold tubing in left hand, elbow at side and forearm out. Rotate forearm in across body. Repeat __10__ times per set. Do _2-3___ sets per session. Do _2-3___ sessions per day.   Strengthening: Resisted External Rotation   Hold tubing in left hand, elbow at side and forearm across body. Rotate forearm out. Repeat __10__ times per set. Do __2-3__ sets per session. Do ____ sessions per day.    

## 2016-03-20 ENCOUNTER — Ambulatory Visit: Payer: Medicare Other | Admitting: Physical Therapy

## 2016-03-20 DIAGNOSIS — M25612 Stiffness of left shoulder, not elsewhere classified: Secondary | ICD-10-CM

## 2016-03-20 DIAGNOSIS — M25512 Pain in left shoulder: Secondary | ICD-10-CM | POA: Diagnosis not present

## 2016-03-20 DIAGNOSIS — G8929 Other chronic pain: Secondary | ICD-10-CM | POA: Diagnosis not present

## 2016-03-20 NOTE — Therapy (Signed)
[facility] [place], [place], [address] Phone: [phone]   Fax:  [phone]  Physical Therapy Treatment  Patient Details  Name: [name] [name] MRN: [id number] Date of Birth: May 23, 1960 Referring Provider: Earlie Server MD.  Encounter Date: 03/20/2016      PT End of Session - 03/20/16 1604    Visit Number 8   Number of Visits 16   Date for PT Re-Evaluation 04/21/16   PT Start Time 0318   PT Stop Time 0406   PT Time Calculation (min) 48 min   Activity Tolerance Patient tolerated treatment well   Behavior During Therapy [facility] for tasks assessed/performed      Past Medical History:  Diagnosis Date  . Atrial fibrillation (HCC)    Paroxysmal  . Bladder dysfunction    Self urinary catheterization  . COPD (chronic obstructive pulmonary disease) ([place])   . DDD (degenerative disc disease)   . Depression   . Esophageal spasm    NTG and Norvasc  . Essential hypertension, benign   . Fibromyalgia   . Gastroparesis   . GERD (gastroesophageal reflux disease)   . History of cardiac catheterization    Normal coronary arteries 11/12  . History of pituitary tumor   . Irritable bowel syndrome   . Lymphedema   . Osteoarthritis   . Sleep apnea    BIPAP  . Type 2 diabetes mellitus ([place])   . Urinary tract infection     Past Surgical History:  Procedure Laterality Date  . ABDOMINAL HYSTERECTOMY    . ANTERIOR CERVICAL DECOMP/DISCECTOMY FUSION N/A 12/02/2014   Procedure: Cervical five cervical six anterior cervical decompression with fusion interbody prosthesis plating and bone graft;  Surgeon: Newman Pies, MD;  Location: [place] [facility];  Service: Neurosurgery;  Laterality: N/A;  C56 anterior cervical decompression with fusion interbody prosthesis plating and bonegraft  . APPENDECTOMY    . BACK SURGERY    . CARDIAC CATHETERIZATION    . CARDIOVERSION N/A 03/26/2014   Procedure: CARDIOVERSION;  Surgeon: Herminio Commons,  MD;  Location: [facility];  Service: Endoscopy;  Laterality: N/A;  . CARDIOVERSION N/A 05/04/2014   Procedure: CARDIOVERSION;  Surgeon: Satira Sark, MD;  Location: [facility];  Service: Cardiovascular;  Laterality: N/A;  . CHOLECYSTECTOMY    . COLONOSCOPY     Approximately 2003. Per medical records, internal hemorrhoids noted  . COLONOSCOPY N/A 06/25/2013   Dr. Gala Romney: Anal canal hemorrhoids-more likely the source of paper hematochezia. Redundant, capacious colon. Multiple colonic polyps-tubular adenoma  . ESOPHAGOGASTRODUODENOSCOPY  10/12/2004   MVE:HMCN plaquing on the esophageal mucosa of uncertain significance, not typical of what is seen with candida esophagitis status post KOH brushing for KOH prep and biopsy for histology. Rule out candida esophagitis/eosinophilic esophagitis. Otherwise normal esophagus. Tiny hiatal hernia. Otherwise, normal stomach, normal D1 and D2. Benign biopsy of esophagus, unknown KOH status.   . ESOPHAGOGASTRODUODENOSCOPY N/A 06/25/2013   Dr. Rourk:mild chronic gastritis  . KNEE SURGERY    . LEFT HEART CATHETERIZATION WITH CORONARY ANGIOGRAM N/A 11/15/2010   Procedure: LEFT HEART CATHETERIZATION WITH CORONARY ANGIOGRAM;  Surgeon: Laverda Page, MD;  Location: [facility] CATH LAB;  Service: Cardiovascular;  Laterality: N/A;  . LUNG BIOPSY      There were no vitals filed for this visit.      Subjective Assessment - 03/20/16 1601    Subjective My pain-level is a strong 3 today. I turned over in bed and it hur.   I'm going to  the doctor Friday.   Currently in Pain? Yes   Pain Score 3    Pain Location Shoulder   Pain Orientation Left   Pain Descriptors / Indicators Aching;Sore   Pain Onset More than a month ago                         [facility] Adult PT Treatment/Exercise - 03/20/16 0001      Shoulder Exercises: Seated   Other Seated Exercises Full can to fatigue x 2 and ER at 45 degrees of abduction with 1#.     Shoulder Exercises: Standing   Other  Standing Exercises RW 4 all motions to fatigue with yellow theraband.     Shoulder Exercises: Pulleys   Flexion Limitations 7 minutes.   Other Pulley Exercises UE ranger x 5 minutes.     Moist Heat Therapy   Number Minutes Moist Heat 15 Minutes   Moist Heat Location --  Left shoulder.     Acupuncturist Location Left shoulder.   Electrical Stimulation Action IFC   Electrical Stimulation Parameters 80-150 hz x 15 minutes.   Electrical Stimulation Goals Pain                  PT Short Term Goals - 02/21/16 1532      PT SHORT TERM GOAL #1   Title STG's=LTG's.           PT Long Term Goals - 03/16/16 1424      PT LONG TERM GOAL #1   Title Independent with an HEP.   Time 8   Period Weeks   Status On-going     PT LONG TERM GOAL #2   Title Active left shoulder flexion to 155 degrees so the patient can easily reach overhead   Time 8   Period Weeks   Status On-going  AROM 135 degrees 03/16/16     PT LONG TERM GOAL #3   Title Active ER to 80 degrees+ to allow for easily donning/doffing of apparel.   Time 8   Period Weeks   Status Achieved  AROM 80 degrees 03/16/16     PT LONG TERM GOAL #4   Title Increase ROM so patient is able to reach behind back to L3.   Time 8   Period Weeks   Status On-going     PT LONG TERM GOAL #5   Title Increase left shoulder strength to a solid 4+/5 to increase stability for performance of functional activities   Status On-going               Plan - 03/20/16 1611    Clinical Impression Statement Patient making excellent progress toward goals.  She did have a slight increase in pain today due to turning over in bed.      Patient will benefit from skilled therapeutic intervention in order to improve the following deficits and impairments:  Pain, Decreased activity tolerance, Decreased range of motion  Visit Diagnosis: Chronic left shoulder pain  Stiffness of left shoulder, not  elsewhere classified     Problem List Patient Active Problem List   Diagnosis Date Noted  . Chest pain 09/15/2015  . Fibromyalgia   . Chronic obstructive pulmonary disease ([place])   . Constipation 08/16/2015  . Nausea without vomiting 03/25/2015  . GERD (gastroesophageal reflux disease) 03/25/2015  . Depression 01/05/2015  . Cervical spondylosis with radiculopathy 12/02/2014  . Rectal bleeding 08/22/2013  . History  of melena 06/05/2013  . Irritable bowel syndrome 06/05/2013  . Precordial pain 02/06/2012  . Paroxysmal atrial fibrillation ([place]) 09/28/2011  . Essential hypertension, benign 09/28/2011  . Obstructive sleep apnea 10/02/2007  . COPD 10/02/2007  . Gastroparesis due to DM ([place]) 10/02/2007    Rhylei Mcquaig, [place] MPT 03/20/2016, 4:40 PM  [facility] [address] [place], [place], [address] Phone: [phone]   Fax:  [phone]  Name: TEIGHAN AUBERT MRN: [id number] Date of Birth: 1960/06/06

## 2016-03-22 ENCOUNTER — Encounter: Payer: Self-pay | Admitting: Physical Therapy

## 2016-03-23 DIAGNOSIS — G894 Chronic pain syndrome: Secondary | ICD-10-CM | POA: Diagnosis not present

## 2016-03-23 DIAGNOSIS — E1142 Type 2 diabetes mellitus with diabetic polyneuropathy: Secondary | ICD-10-CM | POA: Diagnosis not present

## 2016-03-23 DIAGNOSIS — Z79891 Long term (current) use of opiate analgesic: Secondary | ICD-10-CM | POA: Diagnosis not present

## 2016-03-24 DIAGNOSIS — M25512 Pain in left shoulder: Secondary | ICD-10-CM | POA: Diagnosis not present

## 2016-03-27 ENCOUNTER — Other Ambulatory Visit: Payer: Self-pay | Admitting: Cardiology

## 2016-03-27 DIAGNOSIS — Z299 Encounter for prophylactic measures, unspecified: Secondary | ICD-10-CM | POA: Diagnosis not present

## 2016-03-27 DIAGNOSIS — J069 Acute upper respiratory infection, unspecified: Secondary | ICD-10-CM | POA: Diagnosis not present

## 2016-03-27 DIAGNOSIS — Z713 Dietary counseling and surveillance: Secondary | ICD-10-CM | POA: Diagnosis not present

## 2016-03-27 DIAGNOSIS — Z87891 Personal history of nicotine dependence: Secondary | ICD-10-CM | POA: Diagnosis not present

## 2016-03-27 DIAGNOSIS — Z6830 Body mass index (BMI) 30.0-30.9, adult: Secondary | ICD-10-CM | POA: Diagnosis not present

## 2016-03-27 DIAGNOSIS — Z6841 Body Mass Index (BMI) 40.0 and over, adult: Secondary | ICD-10-CM | POA: Diagnosis not present

## 2016-03-27 DIAGNOSIS — M19019 Primary osteoarthritis, unspecified shoulder: Secondary | ICD-10-CM | POA: Diagnosis not present

## 2016-03-27 DIAGNOSIS — E7800 Pure hypercholesterolemia, unspecified: Secondary | ICD-10-CM | POA: Diagnosis not present

## 2016-03-30 DIAGNOSIS — J410 Simple chronic bronchitis: Secondary | ICD-10-CM | POA: Diagnosis not present

## 2016-03-30 DIAGNOSIS — J309 Allergic rhinitis, unspecified: Secondary | ICD-10-CM | POA: Diagnosis not present

## 2016-03-30 DIAGNOSIS — G4733 Obstructive sleep apnea (adult) (pediatric): Secondary | ICD-10-CM | POA: Diagnosis not present

## 2016-04-11 DIAGNOSIS — I4891 Unspecified atrial fibrillation: Secondary | ICD-10-CM | POA: Diagnosis not present

## 2016-04-11 DIAGNOSIS — E1142 Type 2 diabetes mellitus with diabetic polyneuropathy: Secondary | ICD-10-CM | POA: Diagnosis not present

## 2016-04-11 DIAGNOSIS — Z6841 Body Mass Index (BMI) 40.0 and over, adult: Secondary | ICD-10-CM | POA: Diagnosis not present

## 2016-04-11 DIAGNOSIS — I10 Essential (primary) hypertension: Secondary | ICD-10-CM | POA: Diagnosis not present

## 2016-04-11 DIAGNOSIS — Z299 Encounter for prophylactic measures, unspecified: Secondary | ICD-10-CM | POA: Diagnosis not present

## 2016-04-11 DIAGNOSIS — M1710 Unilateral primary osteoarthritis, unspecified knee: Secondary | ICD-10-CM | POA: Diagnosis not present

## 2016-04-18 DIAGNOSIS — N819 Female genital prolapse, unspecified: Secondary | ICD-10-CM | POA: Diagnosis not present

## 2016-04-21 DIAGNOSIS — E1142 Type 2 diabetes mellitus with diabetic polyneuropathy: Secondary | ICD-10-CM | POA: Diagnosis not present

## 2016-04-21 DIAGNOSIS — G894 Chronic pain syndrome: Secondary | ICD-10-CM | POA: Diagnosis not present

## 2016-04-21 DIAGNOSIS — Z79891 Long term (current) use of opiate analgesic: Secondary | ICD-10-CM | POA: Diagnosis not present

## 2016-04-26 ENCOUNTER — Other Ambulatory Visit: Payer: Self-pay | Admitting: Gastroenterology

## 2016-04-26 DIAGNOSIS — K582 Mixed irritable bowel syndrome: Secondary | ICD-10-CM

## 2016-04-26 DIAGNOSIS — K219 Gastro-esophageal reflux disease without esophagitis: Secondary | ICD-10-CM

## 2016-04-26 DIAGNOSIS — R110 Nausea: Secondary | ICD-10-CM

## 2016-04-28 DIAGNOSIS — M25561 Pain in right knee: Secondary | ICD-10-CM | POA: Diagnosis not present

## 2016-04-28 DIAGNOSIS — M25512 Pain in left shoulder: Secondary | ICD-10-CM | POA: Diagnosis not present

## 2016-04-28 NOTE — Progress Notes (Signed)
Cardiology Office Note  Date: 05/01/2016   ID: [name] [name], DOB Mar 25, 1960, MRN [id number]  PCP: Glenda Chroman, MD  Primary Cardiologist: Rozann Lesches, MD   Chief Complaint  Patient presents with  . PAF    History of Present Illness:  [name] [name] is a 56 y.o. female last seen in November 2017. She is here for a routine follow-up visit. Continues to have trouble with chronic pain, mainly back and legs. She tells me that she will eventually be undergoing bilateral knee replacements followed by back surgery, sometime within the next year. She is trying to lose weight at this time.  She reports occasional palpitations, fortunately has not had any prolonged breakthrough episodes of atrial fibrillation on the current regimen.  Current cardiac medications include Cardizem CD, Tambocor, and Xarelto.  Past Medical History:  Diagnosis Date  . Bladder dysfunction    Self urinary catheterization  . COPD (chronic obstructive pulmonary disease) ([place])   . DDD (degenerative disc disease)   . Depression   . Esophageal spasm    NTG and Norvasc  . Essential hypertension, benign   . Fibromyalgia   . Gastroparesis   . GERD (gastroesophageal reflux disease)   . History of cardiac catheterization    Normal coronary arteries 11/12  . History of pituitary tumor   . Irritable bowel syndrome   . Lymphedema   . Osteoarthritis   . PAF (paroxysmal atrial fibrillation) ([place])   . Sleep apnea    BIPAP  . Type 2 diabetes mellitus ([place])   . Urinary tract infection     Past Surgical History:  Procedure Laterality Date  . ABDOMINAL HYSTERECTOMY    . ANTERIOR CERVICAL DECOMP/DISCECTOMY FUSION N/A 12/02/2014   Procedure: Cervical five cervical six anterior cervical decompression with fusion interbody prosthesis plating and bone graft;  Surgeon: Newman Pies, MD;  Location: [facility];  Service: Neurosurgery;  Laterality: N/A;  C56 anterior cervical decompression with fusion interbody  prosthesis plating and bonegraft  . APPENDECTOMY    . BACK SURGERY    . CARDIAC CATHETERIZATION    . CARDIOVERSION N/A 03/26/2014   Procedure: CARDIOVERSION;  Surgeon: Herminio Commons, MD;  Location: [facility];  Service: Endoscopy;  Laterality: N/A;  . CARDIOVERSION N/A 05/04/2014   Procedure: CARDIOVERSION;  Surgeon: Satira Sark, MD;  Location: [facility];  Service: Cardiovascular;  Laterality: N/A;  . CHOLECYSTECTOMY    . COLONOSCOPY     Approximately 2003. Per medical records, internal hemorrhoids noted  . COLONOSCOPY N/A 06/25/2013   Dr. Gala Romney: Anal canal hemorrhoids-more likely the source of paper hematochezia. Redundant, capacious colon. Multiple colonic polyps-tubular adenoma  . ESOPHAGOGASTRODUODENOSCOPY  10/12/2004   IWP:YKDX plaquing on the esophageal mucosa of uncertain significance, not typical of what is seen with candida esophagitis status post KOH brushing for KOH prep and biopsy for histology. Rule out candida esophagitis/eosinophilic esophagitis. Otherwise normal esophagus. Tiny hiatal hernia. Otherwise, normal stomach, normal D1 and D2. Benign biopsy of esophagus, unknown KOH status.   . ESOPHAGOGASTRODUODENOSCOPY N/A 06/25/2013   Dr. Rourk:mild chronic gastritis  . KNEE SURGERY    . LEFT HEART CATHETERIZATION WITH CORONARY ANGIOGRAM N/A 11/15/2010   Procedure: LEFT HEART CATHETERIZATION WITH CORONARY ANGIOGRAM;  Surgeon: Laverda Page, MD;  Location: [facility] C[facility];  Service: Cardiovascular;  Laterality: N/A;  . LUNG BIOPSY      Current Outpatient Prescriptions  Medication Sig Dispense Refill  . acidophilus (RISAQUAD) CAPS capsule Take 1 capsule by mouth daily.    Marland Kitchen  albuterol (PROAIR HFA) 108 (90 BASE) MCG/ACT inhaler Inhale 2 puffs into the lungs every 6 (six) hours as needed for wheezing or shortness of breath.     Marland Kitchen albuterol (PROVENTIL) (2.5 MG/3ML) 0.083% nebulizer solution Take 2.5 mg by nebulization every 6 (six) hours as needed for wheezing. For shortness of  breath.    Marland Kitchen atorvastatin (LIPITOR) 10 MG tablet Take 10 mg by mouth at bedtime.  3  . baclofen (LIORESAL) 20 MG tablet Take 20 mg by mouth 3 (three) times daily.      Marland Kitchen buPROPion (WELLBUTRIN) 75 MG tablet Take 1 tablet (75 mg total) by mouth daily. 30 tablet 3  . Cranberry 250 MG CAPS Take 1 capsule by mouth daily.    . diclofenac sodium (VOLTAREN) 1 % GEL Apply 2-4 g topically 4 (four) times daily as needed. For pain    . dicyclomine (BENTYL) 10 MG capsule Take 1 capsule (10 mg total) by mouth 4 (four) times daily -  before meals and at bedtime. For abdominal cramping and loose stools (Patient taking differently: Take 10 mg by mouth 4 (four) times daily as needed. For abdominal cramping and loose stools) 120 capsule 5  . diltiazem (CARDIZEM CD) 120 MG 24 hr capsule TAKE 1 CAPSULE BY MOUTH EVERY DAY 90 capsule 0  . docusate sodium (COLACE) 100 MG capsule Take 100 mg by mouth 2 (two) times daily.    . flecainide (TAMBOCOR) 50 MG tablet TAKE 2 TABLETS BY MOUTH TWICE DAILY 120 tablet 6  . furosemide (LASIX) 80 MG tablet Take 80 mg by mouth 2 (two) times daily.      Marland Kitchen gabapentin (NEURONTIN) 600 MG tablet Take 600 mg by mouth 4 (four) times daily.    Marland Kitchen glimepiride (AMARYL) 2 MG tablet Take 2-4 mg by mouth 2 (two) times daily. 4mg  in the morning and 2mg  in the evening    . hydrocortisone (PROCTOSOL HC) 2.5 % rectal cream Place 1 application rectally 2 (two) times daily. (Patient taking differently: Place 1 application rectally daily as needed for hemorrhoids. ) 30 g 0  . isosorbide mononitrate (IMDUR) 120 MG 24 hr tablet Take 120 mg by mouth every morning.     . isosorbide mononitrate (IMDUR) 30 MG 24 hr tablet Take 30 mg by mouth every evening.     . linaclotide (LINZESS) 290 MCG CAPS capsule Take 290 mcg by mouth daily before breakfast.    . lisinopril (PRINIVIL,ZESTRIL) 10 MG tablet Take 10 mg by mouth daily.    Marland Kitchen loratadine (CLARITIN) 10 MG tablet Take 10 mg by mouth daily.      . meclizine  (ANTIVERT) 25 MG tablet Take 25 mg by mouth 3 (three) times daily as needed for dizziness.     . metFORMIN (GLUCOPHAGE) 500 MG tablet Take 1,000 mg by mouth 2 (two) times daily.     . Multiple Vitamin (MULTIVITAMIN WITH MINERALS) TABS tablet Take 1 tablet by mouth daily.    Marland Kitchen NEXIUM 40 MG capsule TAKE 1 CAPSULE BY MOUTH EVERY DAY AT NOON 30 capsule 5  . nitroGLYCERIN (NITROLINGUAL) 0.4 MG/SPRAY spray PLACE 1 SPRAY UNDER THE TONGUE AS NEEDED. FOR CHEST PAIN 12 g 3  . NON FORMULARY BIPAP - STARTED ABOUT 1 1/2 years AGO.    Marland Kitchen NUCYNTA ER 150 MG TB12 Take 1 tablet by mouth 2 (two) times daily.     Marland Kitchen nystatin (MYCOSTATIN/NYSTOP) 100000 UNIT/GM POWD Apply 1 g topically daily.     Marland Kitchen omega-3 acid ethyl  esters (LOVAZA) 1 g capsule Take 1 capsule by mouth 4 (four) times daily.    . ondansetron (ZOFRAN) 4 MG tablet TAKE 1 TABLET BY MOUTH EVERY 8 HOURS AS NEEDED FOR NAUSEA OR VOMITING 30 tablet 3  . oxyCODONE-acetaminophen (PERCOCET) 10-325 MG per tablet Take 1 tablet by mouth every 6 (six) hours as needed. For pain.    . polyethylene glycol powder (GLYCOLAX/MIRALAX) powder Take 17 g by mouth daily.     . potassium chloride SA (K-DUR,KLOR-CON) 20 MEQ tablet Take 40 mEq by mouth 4 (four) times daily.      . ranitidine (ZANTAC) 150 MG tablet Take 150 mg by mouth daily as needed for heartburn.     . Rivaroxaban (XARELTO) 20 MG TABS Take 20 mg by mouth every evening.     Marland Kitchen spironolactone (ALDACTONE) 50 MG tablet Take 50 mg by mouth 3 (three) times daily.       No current facility-administered medications for this visit.    Allergies:  Hyoscyamine sulfate and Reglan [metoclopramide]   Social History: The patient  reports that she quit smoking about 9 years ago. Her smoking use included Cigarettes. She started smoking about 39 years ago. She has a 45.00 pack-year smoking history. She has never used smokeless tobacco. She reports that she does not drink alcohol or use drugs.   ROS:  Please see the history of  present illness. Otherwise, complete review of systems is positive for chronic back and leg pain.  All other systems are reviewed and negative.   Physical Exam: VS:  BP 111/66   Pulse 69   Ht 5\' 9"  (1.753 m)   Wt (!) 314 lb 12.8 oz (142.8 kg)   SpO2 97%   BMI 46.49 kg/m , BMI Body mass index is 46.49 kg/m.  Wt Readings from Last 3 Encounters:  05/01/16 (!) 314 lb 12.8 oz (142.8 kg)  01/25/16 (!) 309 lb 12.8 oz (140.5 kg)  12/14/15 (!) 305 lb 3.2 oz (138.4 kg)    Obese woman in no acute distress. HEENT: Conjunctiva and lids normal, oropharynx clear.  Neck: No elevated JVP or carotid bruits.  Lungs: Clear to auscultation, nonlabored breathing at rest.  Cardiac: RRR, no S3 or significant systolic murmur, no pericardial rub.  Abdomen: Soft, nontender, bowel sounds present.  Extremities: Mild edema, distal pulses 2+. Uses a cane to walk. Skin: Warm and dry. Musculoskeletal: No kyphosis. Neuropsychiatric: Alert and oriented 3, affect appropriate.  ECG: I personally reviewed the tracing from 09/15/2015 which showed sinus rhythm with prolonged PR interval, leftward axis, QRS duration 120 ms.  Recent Labwork: 09/15/2015: B Natriuretic Peptide 40.0; BUN 8; Creatinine, Ser 0.60; Hemoglobin 11.9; Platelets 273; Potassium 3.5; Sodium 135   Other Studies Reviewed Today:  Echocardiogram 09/15/2015: Study Conclusions  - Left ventricle: The cavity size was normal. Systolic function was normal. The estimated ejection fraction was in the range of 60% to 65%. Wall motion was normal; there were no regional wall motion abnormalities. Doppler parameters are consistent with abnormal left ventricular relaxation (grade 1 diastolic dysfunction). Mild concentric and moderate focal basal septal hypertrophy.  Assessment and Plan:  1. Paroxysmal atrial fibrillation, doing reasonably well in terms of maintaining sinus rhythm on current medications which include flecainide and Cardizem  CD. She continues on Xarelto for stroke prophylaxis.  2. History of recurrent atypical chest pain with documentation of normal coronary arteries prior assessment. Also has esophageal spasm.  3. Morbid obesity. She is trying to lose weight in preparation for  orthopedic surgeries.  4. Essential hypertension, blood pressure well controlled today.  Current medicines were reviewed with the patient today.  Disposition: Follow-up in 6 months, sooner if needed.  Signed, Satira Sark, MD, [facility] 05/01/2016 3:34 PM    [facility] [place], [place], [place] [address] Phone: [phone]; Fax: [phone]

## 2016-05-01 ENCOUNTER — Ambulatory Visit (INDEPENDENT_AMBULATORY_CARE_PROVIDER_SITE_OTHER): Payer: Medicare Other | Admitting: Cardiology

## 2016-05-01 ENCOUNTER — Encounter: Payer: Self-pay | Admitting: Cardiology

## 2016-05-01 VITALS — BP 111/66 | HR 69 | Ht 69.0 in | Wt 314.8 lb

## 2016-05-01 DIAGNOSIS — I2510 Atherosclerotic heart disease of native coronary artery without angina pectoris: Secondary | ICD-10-CM | POA: Diagnosis not present

## 2016-05-01 DIAGNOSIS — I480 Paroxysmal atrial fibrillation: Secondary | ICD-10-CM | POA: Diagnosis not present

## 2016-05-01 DIAGNOSIS — I209 Angina pectoris, unspecified: Secondary | ICD-10-CM | POA: Diagnosis not present

## 2016-05-01 DIAGNOSIS — I10 Essential (primary) hypertension: Secondary | ICD-10-CM

## 2016-05-01 DIAGNOSIS — I2089 Other forms of angina pectoris: Secondary | ICD-10-CM

## 2016-05-01 DIAGNOSIS — E119 Type 2 diabetes mellitus without complications: Secondary | ICD-10-CM | POA: Diagnosis not present

## 2016-05-01 DIAGNOSIS — I208 Other forms of angina pectoris: Secondary | ICD-10-CM | POA: Diagnosis not present

## 2016-05-01 NOTE — Patient Instructions (Signed)

## 2016-05-05 DIAGNOSIS — M170 Bilateral primary osteoarthritis of knee: Secondary | ICD-10-CM | POA: Diagnosis not present

## 2016-05-11 DIAGNOSIS — L97511 Non-pressure chronic ulcer of other part of right foot limited to breakdown of skin: Secondary | ICD-10-CM | POA: Diagnosis not present

## 2016-05-11 DIAGNOSIS — E1142 Type 2 diabetes mellitus with diabetic polyneuropathy: Secondary | ICD-10-CM | POA: Diagnosis not present

## 2016-05-11 DIAGNOSIS — M79676 Pain in unspecified toe(s): Secondary | ICD-10-CM | POA: Diagnosis not present

## 2016-05-16 DIAGNOSIS — E1142 Type 2 diabetes mellitus with diabetic polyneuropathy: Secondary | ICD-10-CM | POA: Diagnosis not present

## 2016-05-16 DIAGNOSIS — Z6841 Body Mass Index (BMI) 40.0 and over, adult: Secondary | ICD-10-CM | POA: Diagnosis not present

## 2016-05-16 DIAGNOSIS — Z299 Encounter for prophylactic measures, unspecified: Secondary | ICD-10-CM | POA: Diagnosis not present

## 2016-05-16 DIAGNOSIS — Z713 Dietary counseling and surveillance: Secondary | ICD-10-CM | POA: Diagnosis not present

## 2016-05-16 DIAGNOSIS — E1165 Type 2 diabetes mellitus with hyperglycemia: Secondary | ICD-10-CM | POA: Diagnosis not present

## 2016-05-18 DIAGNOSIS — E1142 Type 2 diabetes mellitus with diabetic polyneuropathy: Secondary | ICD-10-CM | POA: Diagnosis not present

## 2016-05-18 DIAGNOSIS — G894 Chronic pain syndrome: Secondary | ICD-10-CM | POA: Diagnosis not present

## 2016-05-18 DIAGNOSIS — Z79891 Long term (current) use of opiate analgesic: Secondary | ICD-10-CM | POA: Diagnosis not present

## 2016-05-25 ENCOUNTER — Other Ambulatory Visit: Payer: Self-pay | Admitting: Gastroenterology

## 2016-05-25 DIAGNOSIS — L97512 Non-pressure chronic ulcer of other part of right foot with fat layer exposed: Secondary | ICD-10-CM | POA: Diagnosis not present

## 2016-06-01 DIAGNOSIS — E119 Type 2 diabetes mellitus without complications: Secondary | ICD-10-CM | POA: Diagnosis not present

## 2016-06-01 DIAGNOSIS — I2510 Atherosclerotic heart disease of native coronary artery without angina pectoris: Secondary | ICD-10-CM | POA: Diagnosis not present

## 2016-06-01 DIAGNOSIS — I10 Essential (primary) hypertension: Secondary | ICD-10-CM | POA: Diagnosis not present

## 2016-06-08 ENCOUNTER — Encounter: Payer: Self-pay | Admitting: Internal Medicine

## 2016-06-08 DIAGNOSIS — Z23 Encounter for immunization: Secondary | ICD-10-CM | POA: Diagnosis not present

## 2016-06-12 DIAGNOSIS — I10 Essential (primary) hypertension: Secondary | ICD-10-CM | POA: Diagnosis not present

## 2016-06-12 DIAGNOSIS — J42 Unspecified chronic bronchitis: Secondary | ICD-10-CM | POA: Diagnosis not present

## 2016-06-12 DIAGNOSIS — I4891 Unspecified atrial fibrillation: Secondary | ICD-10-CM | POA: Diagnosis not present

## 2016-06-12 DIAGNOSIS — E1165 Type 2 diabetes mellitus with hyperglycemia: Secondary | ICD-10-CM | POA: Diagnosis not present

## 2016-06-12 DIAGNOSIS — Z0000 Encounter for general adult medical examination without abnormal findings: Secondary | ICD-10-CM | POA: Diagnosis not present

## 2016-06-12 DIAGNOSIS — Z6841 Body Mass Index (BMI) 40.0 and over, adult: Secondary | ICD-10-CM | POA: Diagnosis not present

## 2016-06-12 DIAGNOSIS — Z7189 Other specified counseling: Secondary | ICD-10-CM | POA: Diagnosis not present

## 2016-06-12 DIAGNOSIS — R5383 Other fatigue: Secondary | ICD-10-CM | POA: Diagnosis not present

## 2016-06-12 DIAGNOSIS — Z1389 Encounter for screening for other disorder: Secondary | ICD-10-CM | POA: Diagnosis not present

## 2016-06-12 DIAGNOSIS — Z299 Encounter for prophylactic measures, unspecified: Secondary | ICD-10-CM | POA: Diagnosis not present

## 2016-06-12 DIAGNOSIS — E1142 Type 2 diabetes mellitus with diabetic polyneuropathy: Secondary | ICD-10-CM | POA: Diagnosis not present

## 2016-06-12 DIAGNOSIS — I2510 Atherosclerotic heart disease of native coronary artery without angina pectoris: Secondary | ICD-10-CM | POA: Diagnosis not present

## 2016-06-12 DIAGNOSIS — E7800 Pure hypercholesterolemia, unspecified: Secondary | ICD-10-CM | POA: Diagnosis not present

## 2016-06-12 DIAGNOSIS — Z79899 Other long term (current) drug therapy: Secondary | ICD-10-CM | POA: Diagnosis not present

## 2016-06-13 DIAGNOSIS — L97511 Non-pressure chronic ulcer of other part of right foot limited to breakdown of skin: Secondary | ICD-10-CM | POA: Diagnosis not present

## 2016-06-15 DIAGNOSIS — Z79891 Long term (current) use of opiate analgesic: Secondary | ICD-10-CM | POA: Diagnosis not present

## 2016-06-15 DIAGNOSIS — E1142 Type 2 diabetes mellitus with diabetic polyneuropathy: Secondary | ICD-10-CM | POA: Diagnosis not present

## 2016-06-15 DIAGNOSIS — G894 Chronic pain syndrome: Secondary | ICD-10-CM | POA: Diagnosis not present

## 2016-06-15 DIAGNOSIS — M48061 Spinal stenosis, lumbar region without neurogenic claudication: Secondary | ICD-10-CM | POA: Diagnosis not present

## 2016-06-27 DIAGNOSIS — I10 Essential (primary) hypertension: Secondary | ICD-10-CM | POA: Diagnosis not present

## 2016-06-27 DIAGNOSIS — E119 Type 2 diabetes mellitus without complications: Secondary | ICD-10-CM | POA: Diagnosis not present

## 2016-06-27 DIAGNOSIS — I2510 Atherosclerotic heart disease of native coronary artery without angina pectoris: Secondary | ICD-10-CM | POA: Diagnosis not present

## 2016-06-29 ENCOUNTER — Other Ambulatory Visit: Payer: Self-pay | Admitting: Cardiology

## 2016-07-11 DIAGNOSIS — E8940 Asymptomatic postprocedural ovarian failure: Secondary | ICD-10-CM | POA: Diagnosis not present

## 2016-07-11 DIAGNOSIS — M8580 Other specified disorders of bone density and structure, unspecified site: Secondary | ICD-10-CM | POA: Diagnosis not present

## 2016-07-12 ENCOUNTER — Encounter: Payer: Medicare Other | Attending: Internal Medicine | Admitting: Nutrition

## 2016-07-12 DIAGNOSIS — E1143 Type 2 diabetes mellitus with diabetic autonomic (poly)neuropathy: Secondary | ICD-10-CM | POA: Diagnosis not present

## 2016-07-12 DIAGNOSIS — K3184 Gastroparesis: Secondary | ICD-10-CM | POA: Diagnosis not present

## 2016-07-12 DIAGNOSIS — Z713 Dietary counseling and surveillance: Secondary | ICD-10-CM | POA: Diagnosis not present

## 2016-07-12 DIAGNOSIS — E119 Type 2 diabetes mellitus without complications: Secondary | ICD-10-CM | POA: Diagnosis present

## 2016-07-12 NOTE — Progress Notes (Signed)
Diabetes Self-Management Education  Visit Type: First/Initial  Appt. Start Time: 1100 Appt. End Time: 1230  07/12/2016  [name], identified by name and date of birth, is a 56 y.o. female with a diagnosis of Diabetes: Type 2.  Metformin 1000 mg BID Glimepiride 2 mg BID. Unsure of A1C. PCP Dr. Woody Seller. Didn't bring meter. BS she notes are in the 140-180's range. Current diet is excessive in calories to meet her needs. She notes she needs to lose weight. Appears to be engaged in wanting to make changes for weight loss and improved blood sugars to reduce complications.  ASSESSMENT  Height 5\' 8"  (1.727 m), weight (!) 322 lb (146.1 kg). Body mass index is 48.96 kg/m.      Diabetes Self-Management Education - 07/12/16 1121      Visit Information   Visit Type First/Initial     Initial Visit   Diabetes Type Type 2   Are you currently following a meal plan? No   Are you taking your medications as prescribed? Yes   Date Diagnosed 2008     Health Coping   How would you rate your overall health? Fair     Psychosocial Assessment   Patient Belief/Attitude about Diabetes Motivated to manage diabetes   Self-care barriers None   Self-management support Family   Other persons present Patient;Other (comment)   Patient Concerns Nutrition/Meal planning;Medication;Monitoring;Healthy Lifestyle;Problem Solving;Weight Control   Special Needs Instruct caregiver   Preferred Learning Style No preference indicated   How often do you need to have someone help you when you read instructions, pamphlets, or other written materials from your doctor or pharmacy? 1 - Never   What is the last grade level you completed in school? 14     Pre-Education Assessment   Patient understands the diabetes disease and treatment process. Needs Review   Patient understands incorporating nutritional management into lifestyle. Needs Review   Patient undertands incorporating physical activity into lifestyle. Needs  Review   Patient understands using medications safely. Needs Review   Patient understands monitoring blood glucose, interpreting and using results Needs Review   Patient understands prevention, detection, and treatment of acute complications. Needs Review   Patient understands prevention, detection, and treatment of chronic complications. Needs Review   Patient understands how to develop strategies to address psychosocial issues. Needs Review   Patient understands how to develop strategies to promote health/change behavior. Needs Review     Complications   Last HgB A1C per patient/outside source 7.4 %   How often do you check your blood sugar? 1-2 times/day   Fasting Blood glucose range (mg/dL) 130-179   Postprandial Blood glucose range (mg/dL) 180-200;130-179   Number of hypoglycemic episodes per month 0   Number of hyperglycemic episodes per week 0   Have you had a dilated eye exam in the past 12 months? No   Have you had a dental exam in the past 12 months? Yes   Are you checking your feet? Yes   How many days per week are you checking your feet? 7     Dietary Intake   Breakfast Yogurt, banana and biscuit, 2 coffee, OJ, MIlk 8 oz   Snack (morning) occasionally chips or fruit   Lunch pudding,  cr of chicken soup, crackers- 6-8, water, Dt mt dew   Dinner [place] bread pizza, strawberry shortcake and chips and dip, dt soda   Snack (evening) cereal, fruit, or chips, or ice cream   Beverage(s) water, diet soda, juice  Exercise   Exercise Type ADL's     Patient Education   Disease state  Factors that contribute to the development of diabetes   Nutrition management  Role of diet in the treatment of diabetes and the relationship between the three main macronutrients and blood glucose level;Carbohydrate counting;Information on hints to eating out and maintain blood glucose control.;Meal timing in regards to the patients' current diabetes medication.;Meal options for control of blood  glucose level and chronic complications.   Physical activity and exercise  Role of exercise on diabetes management, blood pressure control and cardiac health.   Medications Reviewed patients medication for diabetes, action, purpose, timing of dose and side effects.   Monitoring Taught/discussed recording of test results and interpretation of SMBG.;Interpreting lab values - A1C, lipid, urine microalbumina.;Identified appropriate SMBG and/or A1C goals.   Acute complications Discussed and identified patients' treatment of hyperglycemia.   Chronic complications Relationship between chronic complications and blood glucose control;Identified and discussed with patient  current chronic complications;Retinopathy and reason for yearly dilated eye exams;Nephropathy, what it is, prevention of, the use of ACE, ARB's and early detection of through urine microalbumia.;Reviewed with patient heart disease, higher risk of, and prevention;Dental care;Lipid levels, blood glucose control and heart disease;Assessed and discussed foot care and prevention of foot problems   Psychosocial adjustment Worked with patient to identify barriers to care and solutions;Role of stress on diabetes   Personal strategies to promote health Lifestyle issues that need to be addressed for better diabetes care     Individualized Goals (developed by patient)   Nutrition General guidelines for healthy choices and portions discussed;Adjust meds/carbs with exercise as discussed;Follow meal plan discussed   Physical Activity Exercise 3-5 times per week;15 minutes per day   Medications take my medication as prescribed   Monitoring  test my blood glucose as discussed   Reducing Risk examine blood glucose patterns;get labs drawn     Post-Education Assessment   Patient understands the diabetes disease and treatment process. Needs Review   Patient understands incorporating nutritional management into lifestyle. Needs Review   Patient undertands  incorporating physical activity into lifestyle. Needs Review   Patient understands using medications safely. Needs Review   Patient understands monitoring blood glucose, interpreting and using results Needs Review   Patient understands prevention, detection, and treatment of acute complications. Needs Review   Patient understands prevention, detection, and treatment of chronic complications. Needs Review   Patient understands how to develop strategies to address psychosocial issues. Needs Review   Patient understands how to develop strategies to promote health/change behavior. Needs Review     Outcomes   Expected Outcomes Demonstrated interest in learning. Expect positive outcomes   Future DMSE 4-6 wks   Program Status Completed      Individualized Plan for Diabetes Self-Management Training:   Learning Objective:  Patient will have a greater understanding of diabetes self-management. Patient education plan is to attend individual and/or group sessions per assessed needs and concerns.   Plan:   Patient Instructions  Goals 1. Folllow the Plate Method 2. Watch portion sizes 3. Eat meals on time 4. Cut out snacks and diet sodas 5. Drink only water.  6. Eat 2-3 carb choices per meal Aim 60 grams of protein per day.   Expected Outcomes:  Demonstrated interest in learning. Expect positive outcomes  Education material provided: Living Well with Diabetes, Food label handouts, A1C conversion sheet, Meal plan card, My Plate and Carbohydrate counting sheet  If problems or questions, patient to  contact team via:  Phone and Email  Future DSME appointment: 4-6 wks

## 2016-07-12 NOTE — Patient Instructions (Signed)
Goals 1. Folllow the Plate Method 2. Watch portion sizes 3. Eat meals on time 4. Cut out snacks and diet sodas 5. Drink only water.  6. Eat 2-3 carb choices per meal Aim 60 grams of protein per day.

## 2016-07-13 DIAGNOSIS — E119 Type 2 diabetes mellitus without complications: Secondary | ICD-10-CM | POA: Diagnosis not present

## 2016-07-13 DIAGNOSIS — I2510 Atherosclerotic heart disease of native coronary artery without angina pectoris: Secondary | ICD-10-CM | POA: Diagnosis not present

## 2016-07-13 DIAGNOSIS — I10 Essential (primary) hypertension: Secondary | ICD-10-CM | POA: Diagnosis not present

## 2016-07-17 DIAGNOSIS — E1142 Type 2 diabetes mellitus with diabetic polyneuropathy: Secondary | ICD-10-CM | POA: Diagnosis not present

## 2016-07-17 DIAGNOSIS — M48061 Spinal stenosis, lumbar region without neurogenic claudication: Secondary | ICD-10-CM | POA: Diagnosis not present

## 2016-07-17 DIAGNOSIS — G894 Chronic pain syndrome: Secondary | ICD-10-CM | POA: Diagnosis not present

## 2016-07-17 DIAGNOSIS — Z79891 Long term (current) use of opiate analgesic: Secondary | ICD-10-CM | POA: Diagnosis not present

## 2016-07-18 DIAGNOSIS — L97511 Non-pressure chronic ulcer of other part of right foot limited to breakdown of skin: Secondary | ICD-10-CM | POA: Diagnosis not present

## 2016-07-18 DIAGNOSIS — E1142 Type 2 diabetes mellitus with diabetic polyneuropathy: Secondary | ICD-10-CM | POA: Diagnosis not present

## 2016-08-01 DIAGNOSIS — E1142 Type 2 diabetes mellitus with diabetic polyneuropathy: Secondary | ICD-10-CM | POA: Diagnosis not present

## 2016-08-01 DIAGNOSIS — L97511 Non-pressure chronic ulcer of other part of right foot limited to breakdown of skin: Secondary | ICD-10-CM | POA: Diagnosis not present

## 2016-08-08 ENCOUNTER — Ambulatory Visit (INDEPENDENT_AMBULATORY_CARE_PROVIDER_SITE_OTHER): Payer: Medicare Other | Admitting: Internal Medicine

## 2016-08-08 ENCOUNTER — Other Ambulatory Visit: Payer: Self-pay

## 2016-08-08 VITALS — BP 108/48 | HR 70 | Temp 97.8°F | Ht 68.0 in | Wt 309.0 lb

## 2016-08-08 DIAGNOSIS — K3184 Gastroparesis: Secondary | ICD-10-CM | POA: Diagnosis not present

## 2016-08-08 DIAGNOSIS — I208 Other forms of angina pectoris: Secondary | ICD-10-CM

## 2016-08-08 DIAGNOSIS — Z86010 Personal history of colonic polyps: Secondary | ICD-10-CM

## 2016-08-08 DIAGNOSIS — K219 Gastro-esophageal reflux disease without esophagitis: Secondary | ICD-10-CM

## 2016-08-08 DIAGNOSIS — K5903 Drug induced constipation: Secondary | ICD-10-CM

## 2016-08-08 MED ORDER — NA SULFATE-K SULFATE-MG SULF 17.5-3.13-1.6 GM/177ML PO SOLN: 1.0000 | ORAL | 0 refills | Status: DC

## 2016-08-08 NOTE — Progress Notes (Signed)
Primary Care Physician:  Glenda Chroman, MD Primary Gastroenterologist:  Dr. Gala Romney  Pre-Procedure History & Physical: HPI:  [name] [name] is a 56 y.o. female here for follow-up of chronic constipation, GERD and gastroparesis. History of chronic narcotic bowel syndrome. History of multiple colonic adenomas removed 2015. Positive family history of colon cancer in her mother and father. Reflux symptoms fairly well controlled on omeprazole 40 mg daily. No dysphagia. Mild gastritis non-H pylori(by histology) on 2015 EGD .  Past Medical History:  Diagnosis Date  . Bladder dysfunction    Self urinary catheterization  . COPD (chronic obstructive pulmonary disease) ([place])   . DDD (degenerative disc disease)   . Depression   . Esophageal spasm    NTG and Norvasc  . Essential hypertension, benign   . Fibromyalgia   . Gastroparesis   . GERD (gastroesophageal reflux disease)   . History of cardiac catheterization    Normal coronary arteries 11/12  . History of pituitary tumor   . Irritable bowel syndrome   . Lymphedema   . Osteoarthritis   . PAF (paroxysmal atrial fibrillation) ([place])   . Sleep apnea    BIPAP  . Type 2 diabetes mellitus ([place])   . Urinary tract infection     Past Surgical History:  Procedure Laterality Date  . ABDOMINAL HYSTERECTOMY    . ANTERIOR CERVICAL DECOMP/DISCECTOMY FUSION N/A 12/02/2014   Procedure: Cervical five cervical six anterior cervical decompression with fusion interbody prosthesis plating and bone graft;  Surgeon: Newman Pies, MD;  Location: [facility];  Service: Neurosurgery;  Laterality: N/A;  C56 anterior cervical decompression with fusion interbody prosthesis plating and bonegraft  . APPENDECTOMY    . BACK SURGERY    . CARDIAC CATHETERIZATION    . CARDIOVERSION N/A 03/26/2014   Procedure: CARDIOVERSION;  Surgeon: Herminio Commons, MD;  Location: [facility];  Service: Endoscopy;  Laterality: N/A;  . CARDIOVERSION N/A 05/04/2014   Procedure:  CARDIOVERSION;  Surgeon: Satira Sark, MD;  Location: [facility];  Service: Cardiovascular;  Laterality: N/A;  . CHOLECYSTECTOMY    . COLONOSCOPY     Approximately 2003. Per medical records, internal hemorrhoids noted  . COLONOSCOPY N/A 06/25/2013   Dr. Gala Romney: Anal canal hemorrhoids-more likely the source of paper hematochezia. Redundant, capacious colon. Multiple colonic polyps-tubular adenoma  . ESOPHAGOGASTRODUODENOSCOPY  10/12/2004   POE:UMPN plaquing on the esophageal mucosa of uncertain significance, not typical of what is seen with candida esophagitis status post KOH brushing for KOH prep and biopsy for histology. Rule out candida esophagitis/eosinophilic esophagitis. Otherwise normal esophagus. Tiny hiatal hernia. Otherwise, normal stomach, normal D1 and D2. Benign biopsy of esophagus, unknown KOH status.   . ESOPHAGOGASTRODUODENOSCOPY N/A 06/25/2013   Dr. Katlyne Nishida:mild chronic gastritis  . KNEE SURGERY    . LEFT HEART CATHETERIZATION WITH CORONARY ANGIOGRAM N/A 11/15/2010   Procedure: LEFT HEART CATHETERIZATION WITH CORONARY ANGIOGRAM;  Surgeon: Laverda Page, MD;  Location: [facility] C[facility];  Service: Cardiovascular;  Laterality: N/A;  . LUNG BIOPSY      Prior to Admission medications   Medication Sig Start Date End Date Taking? Authorizing Provider  acidophilus (RISAQUAD) CAPS capsule Take 1 capsule by mouth daily.   Yes [provider]  albuterol (PROAIR HFA) 108 (90 BASE) MCG/ACT inhaler Inhale 2 puffs into the lungs every 6 (six) hours as needed for wheezing or shortness of breath.    Yes [provider]  albuterol (PROVENTIL) (2.5 MG/3ML) 0.083% nebulizer solution Take 2.5 mg  by nebulization every 6 (six) hours as needed for wheezing. For shortness of breath.   Yes [provider]  atorvastatin (LIPITOR) 10 MG tablet Take 10 mg by mouth at bedtime. 03/12/15  Yes [provider]  baclofen (LIORESAL) 20 MG tablet Take 20 mg by mouth 3 (three) times  daily.     Yes [provider]  Cranberry 250 MG CAPS Take 1 capsule by mouth daily.   Yes [provider]  diclofenac sodium (VOLTAREN) 1 % GEL Apply 2-4 g topically 4 (four) times daily as needed. For pain   Yes [provider]  dicyclomine (BENTYL) 10 MG capsule Take 1 capsule (10 mg total) by mouth 4 (four) times daily -  before meals and at bedtime. For abdominal cramping and loose stools Patient taking differently: Take 10 mg by mouth 4 (four) times daily as needed. For abdominal cramping and loose stools 06/05/13  Yes Annitta Needs, NP  diltiazem (CARDIZEM CD) 120 MG 24 hr capsule TAKE ONE CAPSULE BY MOUTH EVERY DAY 06/29/16  Yes Satira Sark, MD  docusate sodium (COLACE) 100 MG capsule Take 100 mg by mouth 2 (two) times daily.   Yes [provider]  flecainide (TAMBOCOR) 50 MG tablet TAKE 2 TABLETS BY MOUTH TWICE DAILY 01/31/16  Yes Satira Sark, MD  furosemide (LASIX) 80 MG tablet Take 80 mg by mouth 2 (two) times daily.     Yes [provider]  gabapentin (NEURONTIN) 600 MG tablet Take 600 mg by mouth 4 (four) times daily.   Yes [provider]  glimepiride (AMARYL) 2 MG tablet Take 2-4 mg by mouth 2 (two) times daily. 4mg  in the morning and 2mg  in the evening 05/21/15  Yes [provider]  hydrocortisone (PROCTOSOL HC) 2.5 % rectal cream Place 1 application rectally 2 (two) times daily. Patient taking differently: Place 1 application rectally daily as needed for hemorrhoids.  08/20/13  Yes Annitta Needs, NP  isosorbide mononitrate (IMDUR) 120 MG 24 hr tablet Take 120 mg by mouth every morning.    Yes [provider]  isosorbide mononitrate (IMDUR) 30 MG 24 hr tablet Take 30 mg by mouth every evening.    Yes [provider]  LINZESS 290 MCG CAPS capsule TAKE ONE CAPSULE BY MOUTH ONCE A DAY BEFORE BREAKFAST - REPLACES [place] 05/25/16  Yes Mahala Menghini, PA-C  lisinopril (PRINIVIL,ZESTRIL) 10 MG tablet  Take 10 mg by mouth daily.   Yes [provider]  loratadine (CLARITIN) 10 MG tablet Take 10 mg by mouth daily.     Yes [provider]  meclizine (ANTIVERT) 25 MG tablet Take 25 mg by mouth 3 (three) times daily as needed for dizziness.  07/31/13  Yes [provider]  metFORMIN (GLUCOPHAGE) 500 MG tablet Take 1,000 mg by mouth 2 (two) times daily.  05/18/13  Yes [provider]  Multiple Vitamin (MULTIVITAMIN WITH MINERALS) TABS tablet Take 1 tablet by mouth daily.   Yes [provider]  NEXIUM 40 MG capsule TAKE 1 CAPSULE BY MOUTH EVERY DAY AT NOON 02/29/16  Yes Mahala Menghini, PA-C  nitroGLYCERIN (NITROLINGUAL) 0.4 MG/SPRAY spray PLACE 1 SPRAY UNDER THE TONGUE AS NEEDED. FOR CHEST PAIN 06/18/15  Yes Satira Sark, MD  NON FORMULARY BIPAP - STARTED ABOUT 1 1/2 years AGO.   Yes [provider]  NUCYNTA ER 150 MG TB12 Take 1 tablet by mouth 2 (two) times daily.  12/05/15  Yes [provider]  nystatin (MYCOSTATIN/NYSTOP) 100000 UNIT/GM POWD Apply 1 g topically daily.    Yes [provider]  omega-3 acid ethyl esters (LOVAZA) 1 g capsule Take 1 capsule by mouth 4 (four) times daily. 09/13/15  Yes [provider]  ondansetron (ZOFRAN) 4 MG tablet TAKE 1 TABLET BY MOUTH EVERY 8 HOURS AS NEEDED FOR NAUSEA OR VOMITING 04/26/16  Yes Annitta Needs, NP  oxyCODONE-acetaminophen (PERCOCET) 10-325 MG per tablet Take 1 tablet by mouth every 6 (six) hours as needed. For pain.   Yes [provider]  polyethylene glycol powder (GLYCOLAX/MIRALAX) powder Take 17 g by mouth daily.  08/25/13  Yes [provider]  potassium chloride SA (K-DUR,KLOR-CON) 20 MEQ tablet Take 40 mEq by mouth 4 (four) times daily.     Yes [provider]  ranitidine (ZANTAC) 150 MG tablet Take 150 mg by mouth daily as needed for heartburn.    Yes [provider]  Rivaroxaban (XARELTO) 20 MG TABS Take 20 mg by mouth every evening.     Yes [provider]  spironolactone (ALDACTONE) 50 MG tablet Take 50 mg by mouth 3 (three) times daily.     Yes [provider]  buPROPion (WELLBUTRIN) 75 MG tablet Take 1 tablet (75 mg total) by mouth daily. 06/01/15 05/31/16  Cloria Spring, MD    Allergies as of 08/08/2016 - Review Complete 08/08/2016  Allergen Reaction Noted  . Hyoscyamine sulfate Other (See Comments)   . Reglan [metoclopramide] Other (See Comments) 11/05/2014    Family History  Problem Relation Age of Onset  . Coronary artery disease Father        Died age 50  . Heart attack Father   . Arrhythmia Father        AF  . Diabetes Father   . Parkinson's disease Father   . Arrhythmia Mother        AF  . Stroke Mother   . Dementia Mother   . Cancer Mother        UTERINE   . Parkinson's disease Mother   . Arrhythmia Brother        AF  . Colon cancer Maternal Grandfather   . Colon cancer Paternal Aunt   . Colon cancer Paternal Aunt   . Diabetes Son   . Cancer Sister        BREAST   . Depression Sister   . Depression Sister   . Depression Sister     Social History   Social History  . Marital status: Divorced    Spouse name: N/A  . Number of children: N/A  . Years of education: N/A   Occupational History  . Not on file.   Social History Main Topics  . Smoking status: Former Smoker    Packs/day: 1.50    Years: 30.00    Types: Cigarettes    Start date: 03/06/1977    Quit date: 01/03/2007  . Smokeless tobacco: Never Used  . Alcohol use No  . Drug use: No  . Sexual activity: No   Other Topics Concern  . Not on file   Social History Narrative  . No narrative on file    Review of Systems: See HPI, otherwise negative ROS  Physical Exam: BP (!) 108/48   Pulse 70   Temp 97.8 F (36.6 C) (Oral)   Ht 5\' 8"  (1.727 m)   Wt (!) 309 lb (140.2 kg)   BMI 46.98 kg/m  General:   Alert,  pleasant and cooperative in NAD Neck:  Supple; no masses or thyromegaly. No significant  cervical adenopathy. Lungs:  Clear throughout to auscultation.   No wheezes, crackles, or rhonchi. No acute distress. Heart:  Regular rate and rhythm; no murmurs, clicks, rubs,  or gallops. Abdomen: Non-distended, normal bowel sounds.  Soft and nontender without appreciable mass or hepatosplenomegaly.  Pulses:  Normal pulses noted. Extremities:  Without clubbing or edema.  Impression:  Pleasant 56 year old lady with multiple comorbidities including, chronic constipation with history of multiple colonic polyps removed in  2015 along with positive family history of colon cancer in both parents. She is due for Surveillance colonoscopy at this time. She has come off Xarelto in the past for short-term without bridging without any difficulty. Blood pressure little soft today. She is on multiple medications. She is otherwise asymptomatic.  GERD symptoms well controlled on omeprazole 40 mg daily.  Recommendations: Continue Omeprazole 40 mg daily  Continue Linzess, Miralax and Colace  Schedule a surveillance colonoscopy - propofol - hx of colonic polyps.  The risks, benefits, limitations, alternatives and imponderables have been reviewed with the patient. Questions have been answered. All parties are agreeable.   Prep includes an extra day full day of clear liquids; Increase Miralax to 2 packets twice daily x 5 days prior to procedure.  Hold Amaryl and metformin the evening before colonoscopy and the morning of colonoscopy.  Stop Xarelto 2 days prior to procedure  Have Dr. Woody Seller to recheck your blood pressure tomorrow.    Notice: This dictation was prepared with Dragon dictation along with smaller phrase technology. Any transcriptional errors that result from this process are unintentional and may not be corrected upon review.

## 2016-08-08 NOTE — Patient Instructions (Addendum)
Continue Omeprazole 40 mg daily  Continue linzess, Miralax and Colace  Schedule a surveillance colonoscopy - propofol - hx of colonic polyps  Prep includes an extra day full day of clear liquids; Increase Miralax to 2 packets twice daily x 5 days prior to procedure.  Hold Amaryl and metformin the evening before colonoscopy and the morning of colonoscopy.  Stop Xarelto 2 days prior to procedure  Have Dr. Woody Seller recheck your blood pressure tomorrow.

## 2016-08-09 ENCOUNTER — Telehealth: Payer: Self-pay

## 2016-08-09 NOTE — Telephone Encounter (Signed)
Called and informed pt of pre-op appt 09/19/16 at 10:00am. Letter mailed.

## 2016-08-11 DIAGNOSIS — M170 Bilateral primary osteoarthritis of knee: Secondary | ICD-10-CM | POA: Diagnosis not present

## 2016-08-16 ENCOUNTER — Encounter: Payer: Self-pay | Admitting: Nutrition

## 2016-08-16 ENCOUNTER — Encounter: Payer: Medicare Other | Attending: Internal Medicine | Admitting: Nutrition

## 2016-08-16 DIAGNOSIS — E119 Type 2 diabetes mellitus without complications: Secondary | ICD-10-CM | POA: Insufficient documentation

## 2016-08-16 DIAGNOSIS — E669 Obesity, unspecified: Secondary | ICD-10-CM | POA: Diagnosis not present

## 2016-08-16 DIAGNOSIS — IMO0002 Reserved for concepts with insufficient information to code with codable children: Secondary | ICD-10-CM

## 2016-08-16 DIAGNOSIS — Z713 Dietary counseling and surveillance: Secondary | ICD-10-CM | POA: Diagnosis not present

## 2016-08-16 DIAGNOSIS — Z6841 Body Mass Index (BMI) 40.0 and over, adult: Secondary | ICD-10-CM | POA: Diagnosis not present

## 2016-08-16 DIAGNOSIS — E1165 Type 2 diabetes mellitus with hyperglycemia: Secondary | ICD-10-CM

## 2016-08-16 DIAGNOSIS — E118 Type 2 diabetes mellitus with unspecified complications: Secondary | ICD-10-CM

## 2016-08-16 NOTE — Patient Instructions (Addendum)
Goals 1. Increase 15 minutes of chair exercises a day 2. Be more accucate with portions 3. Reduce salt intake and use Mrs. Dash 4. Increase more fresh fruits and vegetables Lose 1 lb per week. Keep food journal

## 2016-08-16 NOTE — Progress Notes (Signed)
Diabetes Self-Management Education  Visit Type:    Appt. Start Time: 1100 Appt. End Time: 1230  08/16/2016  [name], identified by name and date of birth, is a 56 y.o. female with a diagnosis of Diabetes:  .  7.4% 3 months ago and obesity. . Metformin 1000 mg BID Glimepiride 4 mg in am and 2 mg at night.. . PCP Dr. Woody Seller. Gets her A1C down end of this month she thinks.. Didn't bring meter. BS she notes are in the 140-180's range. Current diet is excessive in calories to meet her needs. She notes she needs to lose weight but hasn't lost any weight since last visit. She notes she has been doing better about portion control.Marland Kitchen Appears to be engaged in wanting to make changes for weight loss and improved blood sugars to reduce complications.  ASSESSMENT  322 lbs  BMI 48 Calorie Needs 1200-1500 kcal 135 g CHO 90 g Pro 33 g Fat   Individualized Plan for Diabetes Self-Management Training:   Learning Objective:  Patient will have a greater understanding of diabetes self-management. Patient education plan is to attend individual and/or group sessions per assessed needs and concerns.   Plan:   Goals 1. Increase 15 minutes of chair exercises a day 2. Be more accucate with portions 3. Reduce salt intake and use Mrs. Dash 4. Increase more fresh fruits and vegetables Lose 1 lb per week. Keep food journal Expected Outcomes:     Education material provided: Living Well with Diabetes, Food label handouts, A1C conversion sheet, Meal plan card, My Plate and Carbohydrate counting sheet  If problems or questions, patient to contact team via:  Phone and Email  Future DSME appointment:   1 month Recommend to consider stopping Glimepiride and consider a GLP1 or SGLT2 for weight loss and improved blood sugars.

## 2016-08-18 DIAGNOSIS — M170 Bilateral primary osteoarthritis of knee: Secondary | ICD-10-CM | POA: Diagnosis not present

## 2016-08-18 DIAGNOSIS — M48061 Spinal stenosis, lumbar region without neurogenic claudication: Secondary | ICD-10-CM | POA: Diagnosis not present

## 2016-08-18 DIAGNOSIS — Z79891 Long term (current) use of opiate analgesic: Secondary | ICD-10-CM | POA: Diagnosis not present

## 2016-08-18 DIAGNOSIS — G894 Chronic pain syndrome: Secondary | ICD-10-CM | POA: Diagnosis not present

## 2016-08-18 DIAGNOSIS — E1142 Type 2 diabetes mellitus with diabetic polyneuropathy: Secondary | ICD-10-CM | POA: Diagnosis not present

## 2016-08-20 ENCOUNTER — Other Ambulatory Visit: Payer: Self-pay | Admitting: Cardiology

## 2016-08-20 ENCOUNTER — Other Ambulatory Visit: Payer: Self-pay | Admitting: Gastroenterology

## 2016-08-24 DIAGNOSIS — I8390 Asymptomatic varicose veins of unspecified lower extremity: Secondary | ICD-10-CM | POA: Diagnosis not present

## 2016-08-24 DIAGNOSIS — I10 Essential (primary) hypertension: Secondary | ICD-10-CM | POA: Diagnosis not present

## 2016-08-24 DIAGNOSIS — Z299 Encounter for prophylactic measures, unspecified: Secondary | ICD-10-CM | POA: Diagnosis not present

## 2016-08-24 DIAGNOSIS — E1142 Type 2 diabetes mellitus with diabetic polyneuropathy: Secondary | ICD-10-CM | POA: Diagnosis not present

## 2016-08-24 DIAGNOSIS — Z6841 Body Mass Index (BMI) 40.0 and over, adult: Secondary | ICD-10-CM | POA: Diagnosis not present

## 2016-08-24 DIAGNOSIS — E1165 Type 2 diabetes mellitus with hyperglycemia: Secondary | ICD-10-CM | POA: Diagnosis not present

## 2016-08-24 DIAGNOSIS — E7800 Pure hypercholesterolemia, unspecified: Secondary | ICD-10-CM | POA: Diagnosis not present

## 2016-08-24 DIAGNOSIS — I2510 Atherosclerotic heart disease of native coronary artery without angina pectoris: Secondary | ICD-10-CM | POA: Diagnosis not present

## 2016-08-24 DIAGNOSIS — I4891 Unspecified atrial fibrillation: Secondary | ICD-10-CM | POA: Diagnosis not present

## 2016-08-24 DIAGNOSIS — R600 Localized edema: Secondary | ICD-10-CM | POA: Diagnosis not present

## 2016-08-25 DIAGNOSIS — M170 Bilateral primary osteoarthritis of knee: Secondary | ICD-10-CM | POA: Diagnosis not present

## 2016-08-29 DIAGNOSIS — L97511 Non-pressure chronic ulcer of other part of right foot limited to breakdown of skin: Secondary | ICD-10-CM | POA: Diagnosis not present

## 2016-08-31 ENCOUNTER — Other Ambulatory Visit: Payer: Self-pay | Admitting: Cardiology

## 2016-09-07 DIAGNOSIS — I2510 Atherosclerotic heart disease of native coronary artery without angina pectoris: Secondary | ICD-10-CM | POA: Diagnosis not present

## 2016-09-07 DIAGNOSIS — E119 Type 2 diabetes mellitus without complications: Secondary | ICD-10-CM | POA: Diagnosis not present

## 2016-09-07 DIAGNOSIS — I10 Essential (primary) hypertension: Secondary | ICD-10-CM | POA: Diagnosis not present

## 2016-09-14 NOTE — Patient Instructions (Signed)
[name] [name]  09/14/2016     @PREFPERIOPPHARMACY @   Your procedure is scheduled on  09/25/2016   Report to [facility] at  82  A.M.  Call this number if you have problems the morning of surgery:  [phone]   Remember:  Do not eat food or drink liquids after midnight.  Take these medicines the morning of surgery with A SIP OF WATER  Wellbutrin, cardiazem, tambocor, neurontin, isosorbide, lisinopril, claritin, nexium or zantac, zofran, nucynta or oxycodone. Use your nebulizer and your inhalers before you come.   Do not wear jewelry, make-up or nail polish.  Do not wear lotions, powders, or perfumes, or deoderant.  Do not shave 48 hours prior to surgery.  Men may shave face and neck.  Do not bring valuables to the hospital.  [facility] is not responsible for any belongings or valuables.  Contacts, dentures or bridgework may not be worn into surgery.  Leave your suitcase in the car.  After surgery it may be brought to your room.  For patients admitted to the hospital, discharge time will be determined by your treatment team.  Patients discharged the day of surgery will not be allowed to drive home.   Name and phone number of your driver:   family Special instructions:  Follow the diet and prep instructions given to you by Dr Roseanne Kaufman office.  Please read over the following fact sheets that you were given. Anesthesia Post-op Instructions and Care and Recovery After Surgery       Colonoscopy, Adult A colonoscopy is an exam to look at the entire large intestine. During the exam, a lubricated, bendable tube is inserted into the anus and then passed into the rectum, colon, and other parts of the large intestine. A colonoscopy is often done as a part of normal colorectal screening or in response to certain symptoms, such as anemia, persistent diarrhea, abdominal pain, and blood in the stool. The exam can help screen for and diagnose medical problems,  including:  Tumors.  Polyps.  Inflammation.  Areas of bleeding.  Tell a health care provider about:  Any allergies you have.  All medicines you are taking, including vitamins, herbs, eye drops, creams, and over-the-counter medicines.  Any problems you or family members have had with anesthetic medicines.  Any blood disorders you have.  Any surgeries you have had.  Any medical conditions you have.  Any problems you have had passing stool. What are the risks? Generally, this is a safe procedure. However, problems may occur, including:  Bleeding.  A tear in the intestine.  A reaction to medicines given during the exam.  Infection (rare).  What happens before the procedure? Eating and drinking restrictions Follow instructions from your health care provider about eating and drinking, which may include:  A few days before the procedure - follow a low-fiber diet. Avoid nuts, seeds, dried fruit, raw fruits, and vegetables.  1-3 days before the procedure - follow a clear liquid diet. Drink only clear liquids, such as clear broth or bouillon, black coffee or tea, clear juice, clear soft drinks or sports drinks, gelatin dessert, and popsicles. Avoid any liquids that contain red or purple dye.  On the day of the procedure - do not eat or drink anything during the 2 hours before the procedure, or within the time period that your health care provider recommends.  Bowel prep If you were prescribed an oral bowel prep to  clean out your colon:  Take it as told by your health care provider. Starting the day before your procedure, you will need to drink a large amount of medicated liquid. The liquid will cause you to have multiple loose stools until your stool is almost clear or light green.  If your skin or anus gets irritated from diarrhea, you may use these to relieve the irritation: ? Medicated wipes, such as adult wet wipes with aloe and vitamin E. ? A skin soothing-product like  petroleum jelly.  If you vomit while drinking the bowel prep, take a break for up to 60 minutes and then begin the bowel prep again. If vomiting continues and you cannot take the bowel prep without vomiting, call your health care provider.  General instructions  Ask your health care provider about changing or stopping your regular medicines. This is especially important if you are taking diabetes medicines or blood thinners.  Plan to have someone take you home from the hospital or clinic. What happens during the procedure?  An IV tube may be inserted into one of your veins.  You will be given medicine to help you relax (sedative).  To reduce your risk of infection: ? Your health care team will wash or sanitize their hands. ? Your anal area will be washed with soap.  You will be asked to lie on your side with your knees bent.  Your health care provider will lubricate a long, thin, flexible tube. The tube will have a camera and a light on the end.  The tube will be inserted into your anus.  The tube will be gently eased through your rectum and colon.  Air will be delivered into your colon to keep it open. You may feel some pressure or cramping.  The camera will be used to take images during the procedure.  A small tissue sample may be removed from your body to be examined under a microscope (biopsy). If any potential problems are found, the tissue will be sent to a lab for testing.  If small polyps are found, your health care provider may remove them and have them checked for cancer cells.  The tube that was inserted into your anus will be slowly removed. The procedure may vary among health care providers and hospitals. What happens after the procedure?  Your blood pressure, heart rate, breathing rate, and blood oxygen level will be monitored until the medicines you were given have worn off.  Do not drive for 24 hours after the exam.  You may have a small amount of blood in  your stool.  You may pass gas and have mild abdominal cramping or bloating due to the air that was used to inflate your colon during the exam.  It is up to you to get the results of your procedure. Ask your health care provider, or the department performing the procedure, when your results will be ready. This information is not intended to replace advice given to you by your health care provider. Make sure you discuss any questions you have with your health care provider. Document Released: 12/17/1999 Document Revised: 10/20/2015 Document Reviewed: 03/02/2015 Elsevier Interactive Patient Education  2018 [employer].  Colonoscopy, Adult, Care After This sheet gives you information about how to care for yourself after your procedure. Your health care provider may also give you more specific instructions. If you have problems or questions, contact your health care provider. What can I expect after the procedure? After the procedure, it  is common to have:  A small amount of blood in your stool for 24 hours after the procedure.  Some gas.  Mild abdominal cramping or bloating.  Follow these instructions at home: General instructions   For the first 24 hours after the procedure: ? Do not drive or use machinery. ? Do not sign important documents. ? Do not drink alcohol. ? Do your regular daily activities at a slower pace than normal. ? Eat soft, easy-to-digest foods. ? Rest often.  Take over-the-counter or prescription medicines only as told by your health care provider.  It is up to you to get the results of your procedure. Ask your health care provider, or the department performing the procedure, when your results will be ready. Relieving cramping and bloating  Try walking around when you have cramps or feel bloated.  Apply heat to your abdomen as told by your health care provider. Use a heat source that your health care provider recommends, such as a moist heat pack or a heating  pad. ? Place a towel between your skin and the heat source. ? Leave the heat on for 20-30 minutes. ? Remove the heat if your skin turns bright red. This is especially important if you are unable to feel pain, heat, or cold. You may have a greater risk of getting burned. Eating and drinking  Drink enough fluid to keep your urine clear or pale yellow.  Resume your normal diet as instructed by your health care provider. Avoid heavy or fried foods that are hard to digest.  Avoid drinking alcohol for as long as instructed by your health care provider. Contact a health care provider if:  You have blood in your stool 2-3 days after the procedure. Get help right away if:  You have more than a small spotting of blood in your stool.  You pass large blood clots in your stool.  Your abdomen is swollen.  You have nausea or vomiting.  You have a fever.  You have increasing abdominal pain that is not relieved with medicine. This information is not intended to replace advice given to you by your health care provider. Make sure you discuss any questions you have with your health care provider. Document Released: 08/03/2003 Document Revised: 09/13/2015 Document Reviewed: 03/02/2015 Elsevier Interactive Patient Education  2018 [place] Anesthesia is a term that refers to techniques, procedures, and medicines that help a person stay safe and comfortable during a medical procedure. Monitored anesthesia care, or sedation, is one type of anesthesia. Your anesthesia specialist may recommend sedation if you will be having a procedure that does not require you to be unconscious, such as:  Cataract surgery.  A dental procedure.  A biopsy.  A colonoscopy.  During the procedure, you may receive a medicine to help you relax (sedative). There are three levels of sedation:  Mild sedation. At this level, you may feel awake and relaxed. You will be able to follow  directions.  Moderate sedation. At this level, you will be sleepy. You may not remember the procedure.  Deep sedation. At this level, you will be asleep. You will not remember the procedure.  The more medicine you are given, the deeper your level of sedation will be. Depending on how you respond to the procedure, the anesthesia specialist may change your level of sedation or the type of anesthesia to fit your needs. An anesthesia specialist will monitor you closely during the procedure. Let your health care provider  know about:  Any allergies you have.  All medicines you are taking, including vitamins, herbs, eye drops, creams, and over-the-counter medicines.  Any use of steroids (by mouth or as a cream).  Any problems you or family members have had with sedatives and anesthetic medicines.  Any blood disorders you have.  Any surgeries you have had.  Any medical conditions you have, such as sleep apnea.  Whether you are pregnant or may be pregnant.  Any use of cigarettes, alcohol, or street drugs. What are the risks? Generally, this is a safe procedure. However, problems may occur, including:  Getting too much medicine (oversedation).  Nausea.  Allergic reaction to medicines.  Trouble breathing. If this happens, a breathing tube may be used to help with breathing. It will be removed when you are awake and breathing on your own.  Heart trouble.  Lung trouble.  Before the procedure Staying hydrated Follow instructions from your health care provider about hydration, which may include:  Up to 2 hours before the procedure - you may continue to drink clear liquids, such as water, clear fruit juice, black coffee, and plain tea.  Eating and drinking restrictions Follow instructions from your health care provider about eating and drinking, which may include:  8 hours before the procedure - stop eating heavy meals or foods such as meat, fried foods, or fatty foods.  6 hours  before the procedure - stop eating light meals or foods, such as toast or cereal.  6 hours before the procedure - stop drinking milk or drinks that contain milk.  2 hours before the procedure - stop drinking clear liquids.  Medicines Ask your health care provider about:  Changing or stopping your regular medicines. This is especially important if you are taking diabetes medicines or blood thinners.  Taking medicines such as aspirin and ibuprofen. These medicines can thin your blood. Do not take these medicines before your procedure if your health care provider instructs you not to.  Tests and exams  You will have a physical exam.  You may have blood tests done to show: ? How well your kidneys and liver are working. ? How well your blood can clot.  General instructions  Plan to have someone take you home from the hospital or clinic.  If you will be going home right after the procedure, plan to have someone with you for 24 hours.  What happens during the procedure?  Your blood pressure, heart rate, breathing, level of pain and overall condition will be monitored.  An IV tube will be inserted into one of your veins.  Your anesthesia specialist will give you medicines as needed to keep you comfortable during the procedure. This may mean changing the level of sedation.  The procedure will be performed. After the procedure  Your blood pressure, heart rate, breathing rate, and blood oxygen level will be monitored until the medicines you were given have worn off.  Do not drive for 24 hours if you received a sedative.  You may: ? Feel sleepy, clumsy, or nauseous. ? Feel forgetful about what happened after the procedure. ? Have a sore throat if you had a breathing tube during the procedure. ? Vomit. This information is not intended to replace advice given to you by your health care provider. Make sure you discuss any questions you have with your health care provider. Document  Released: 09/14/2004 Document Revised: 05/28/2015 Document Reviewed: 04/11/2015 Elsevier Interactive Patient Education  2018 [place], Care  After These instructions provide you with information about caring for yourself after your procedure. Your health care provider may also give you more specific instructions. Your treatment has been planned according to current medical practices, but problems sometimes occur. Call your health care provider if you have any problems or questions after your procedure. What can I expect after the procedure? After your procedure, it is common to:  Feel sleepy for several hours.  Feel clumsy and have poor balance for several hours.  Feel forgetful about what happened after the procedure.  Have poor judgment for several hours.  Feel nauseous or vomit.  Have a sore throat if you had a breathing tube during the procedure.  Follow these instructions at home: For at least 24 hours after the procedure:   Do not: ? Participate in activities in which you could fall or become injured. ? Drive. ? Use heavy machinery. ? Drink alcohol. ? Take sleeping pills or medicines that cause drowsiness. ? Make important decisions or sign legal documents. ? Take care of children on your own.  Rest. Eating and drinking  Follow the diet that is recommended by your health care provider.  If you vomit, drink water, juice, or soup when you can drink without vomiting.  Make sure you have little or no nausea before eating solid foods. General instructions  Have a responsible adult stay with you until you are awake and alert.  Take over-the-counter and prescription medicines only as told by your health care provider.  If you smoke, do not smoke without supervision.  Keep all follow-up visits as told by your health care provider. This is important. Contact a health care provider if:  You keep feeling nauseous or you keep  vomiting.  You feel light-headed.  You develop a rash.  You have a fever. Get help right away if:  You have trouble breathing. This information is not intended to replace advice given to you by your health care provider. Make sure you discuss any questions you have with your health care provider. Document Released: 04/11/2015 Document Revised: 08/11/2015 Document Reviewed: 04/11/2015 Elsevier Interactive Patient Education  [employer].

## 2016-09-18 DIAGNOSIS — Z79891 Long term (current) use of opiate analgesic: Secondary | ICD-10-CM | POA: Diagnosis not present

## 2016-09-18 DIAGNOSIS — E1142 Type 2 diabetes mellitus with diabetic polyneuropathy: Secondary | ICD-10-CM | POA: Diagnosis not present

## 2016-09-18 DIAGNOSIS — M48061 Spinal stenosis, lumbar region without neurogenic claudication: Secondary | ICD-10-CM | POA: Diagnosis not present

## 2016-09-18 DIAGNOSIS — G894 Chronic pain syndrome: Secondary | ICD-10-CM | POA: Diagnosis not present

## 2016-09-19 ENCOUNTER — Encounter (HOSPITAL_COMMUNITY)
Admission: RE | Admit: 2016-09-19 | Discharge: 2016-09-19 | Disposition: A | Discharge status: Home / Self Care | Payer: Medicare Other | Source: Ambulatory Visit | Attending: Internal Medicine | Admitting: Internal Medicine

## 2016-09-19 ENCOUNTER — Telehealth: Payer: Self-pay

## 2016-09-19 ENCOUNTER — Ambulatory Visit: Payer: Self-pay | Admitting: Nutrition

## 2016-09-19 ENCOUNTER — Encounter (HOSPITAL_COMMUNITY): Payer: Self-pay

## 2016-09-19 NOTE — Telephone Encounter (Signed)
Endo scheduler said pt called to cancel her pre-op for today and wanted to reschedule Colonoscopy, she was scheduled for Colonoscopy 09/25/16. Called pt, she said she is sick and in the process of moving and wanted to wait to have Colonoscopy. She will have OV 11/01/16 to update info and reschedule procedure. Endo scheduler is aware.  Routing to RMR for FYI.

## 2016-09-19 NOTE — Telephone Encounter (Signed)
Communication noted.  

## 2016-09-25 ENCOUNTER — Encounter (HOSPITAL_COMMUNITY): Admission: RE | Payer: Self-pay | Source: Ambulatory Visit

## 2016-09-25 ENCOUNTER — Ambulatory Visit (HOSPITAL_COMMUNITY): Admission: RE | Admit: 2016-09-25 | Payer: Medicare Other | Source: Ambulatory Visit | Admitting: Internal Medicine

## 2016-09-25 SURGERY — COLONOSCOPY WITH PROPOFOL
Anesthesia: Monitor Anesthesia Care

## 2016-10-02 DIAGNOSIS — I2510 Atherosclerotic heart disease of native coronary artery without angina pectoris: Secondary | ICD-10-CM | POA: Diagnosis not present

## 2016-10-02 DIAGNOSIS — E119 Type 2 diabetes mellitus without complications: Secondary | ICD-10-CM | POA: Diagnosis not present

## 2016-10-02 DIAGNOSIS — I10 Essential (primary) hypertension: Secondary | ICD-10-CM | POA: Diagnosis not present

## 2016-10-03 DIAGNOSIS — B351 Tinea unguium: Secondary | ICD-10-CM | POA: Diagnosis not present

## 2016-10-03 DIAGNOSIS — L84 Corns and callosities: Secondary | ICD-10-CM | POA: Diagnosis not present

## 2016-10-03 DIAGNOSIS — E1142 Type 2 diabetes mellitus with diabetic polyneuropathy: Secondary | ICD-10-CM | POA: Diagnosis not present

## 2016-10-03 DIAGNOSIS — M79676 Pain in unspecified toe(s): Secondary | ICD-10-CM | POA: Diagnosis not present

## 2016-10-13 DIAGNOSIS — Z6841 Body Mass Index (BMI) 40.0 and over, adult: Secondary | ICD-10-CM | POA: Diagnosis not present

## 2016-10-13 DIAGNOSIS — M545 Low back pain: Secondary | ICD-10-CM | POA: Diagnosis not present

## 2016-10-13 DIAGNOSIS — M542 Cervicalgia: Secondary | ICD-10-CM | POA: Diagnosis not present

## 2016-10-17 DIAGNOSIS — M48061 Spinal stenosis, lumbar region without neurogenic claudication: Secondary | ICD-10-CM | POA: Diagnosis not present

## 2016-10-17 DIAGNOSIS — E1142 Type 2 diabetes mellitus with diabetic polyneuropathy: Secondary | ICD-10-CM | POA: Diagnosis not present

## 2016-10-17 DIAGNOSIS — Z79891 Long term (current) use of opiate analgesic: Secondary | ICD-10-CM | POA: Diagnosis not present

## 2016-10-17 DIAGNOSIS — G894 Chronic pain syndrome: Secondary | ICD-10-CM | POA: Diagnosis not present

## 2016-10-25 NOTE — Progress Notes (Signed)
Cardiology Office Note  Date: 10/30/2016   ID: [name] [name], DOB 10/12/1960, MRN [id number]  PCP: Glenda Chroman, MD  Primary Cardiologist: Rozann Lesches, MD   Chief Complaint  Patient presents with  . PAF    History of Present Illness: [name] [name] is a 56 y.o. female last seen in April.  She presents for a routine follow-up visit.  Since last encounter she does not report any progressive or prolonged palpitations on current medical regimen.  We discussed her medications.  Current cardiac regimen includes Xarelto, flecainide, and Cardizem CD.  She is due for follow-up lab work.  Reports no active bleeding problems.  I personally reviewed her ECG today which shows a sinus rhythm with right bundle branch block.  Past Medical History:  Diagnosis Date  . Bladder dysfunction    Self urinary catheterization  . COPD (chronic obstructive pulmonary disease) ([place])   . DDD (degenerative disc disease)   . Depression   . Esophageal spasm    NTG and Norvasc  . Essential hypertension, benign   . Fibromyalgia   . Gastroparesis   . GERD (gastroesophageal reflux disease)   . History of cardiac catheterization    Normal coronary arteries 11/12  . History of pituitary tumor   . Irritable bowel syndrome   . Lymphedema   . Osteoarthritis   . PAF (paroxysmal atrial fibrillation) ([place])   . Sleep apnea    BIPAP  . Type 2 diabetes mellitus ([place])   . Urinary tract infection     Past Surgical History:  Procedure Laterality Date  . ABDOMINAL HYSTERECTOMY    . ANTERIOR CERVICAL DECOMP/DISCECTOMY FUSION N/A 12/02/2014   Procedure: Cervical five cervical six anterior cervical decompression with fusion interbody prosthesis plating and bone graft;  Surgeon: Newman Pies, MD;  Location: [facility];  Service: Neurosurgery;  Laterality: N/A;  C56 anterior cervical decompression with fusion interbody prosthesis plating and bonegraft  . APPENDECTOMY    . BACK SURGERY    . CARDIAC  CATHETERIZATION    . CARDIOVERSION N/A 03/26/2014   Procedure: CARDIOVERSION;  Surgeon: Herminio Commons, MD;  [facility]: [facility];  Service: Endoscopy;  Laterality: N/A;  . CARDIOVERSION N/A 05/04/2014   Procedure: CARDIOVERSION;  Surgeon: Satira Sark, MD;  Location: [facility];  Service: Cardiovascular;  Laterality: N/A;  . CHOLECYSTECTOMY    . COLONOSCOPY     Approximately 2003. Per medical records, internal hemorrhoids noted  . COLONOSCOPY N/A 06/25/2013   Dr. Gala Romney: Anal canal hemorrhoids-more likely the source of paper hematochezia. Redundant, capacious colon. Multiple colonic polyps-tubular adenoma  . ESOPHAGOGASTRODUODENOSCOPY  10/12/2004   IRC:VELF plaquing on the esophageal mucosa of uncertain significance, not typical of what is seen with candida esophagitis status post KOH brushing for KOH prep and biopsy for histology. Rule out candida esophagitis/eosinophilic esophagitis. Otherwise normal esophagus. Tiny hiatal hernia. Otherwise, normal stomach, normal D1 and D2. Benign biopsy of esophagus, unknown KOH status.   . ESOPHAGOGASTRODUODENOSCOPY N/A 06/25/2013   Dr. Rourk:mild chronic gastritis  . KNEE SURGERY    . LEFT HEART CATHETERIZATION WITH CORONARY ANGIOGRAM N/A 11/15/2010   Procedure: LEFT HEART CATHETERIZATION WITH CORONARY ANGIOGRAM;  Surgeon: Laverda Page, MD;  [facility];  Service: Cardiovascular;  Laterality: N/A;  . LUNG BIOPSY      Current Outpatient Prescriptions  Medication Sig Dispense Refill  . albuterol (PROAIR HFA) 108 (90 BASE) MCG/ACT inhaler Inhale 2 puffs into the lungs every 6 (six) hours as needed  for wheezing or shortness of breath.     Marland Kitchen albuterol (PROVENTIL) (2.5 MG/3ML) 0.083% nebulizer solution Take 2.5 mg by nebulization every 6 (six) hours as needed for wheezing. For shortness of breath.    Marland Kitchen atorvastatin (LIPITOR) 10 MG tablet Take 10 mg by mouth at bedtime.  3  . baclofen (LIORESAL) 20 MG tablet Take 20 mg by mouth 3 (three) times  daily.      . diclofenac sodium (VOLTAREN) 1 % GEL Apply 2-4 g topically 4 (four) times daily as needed. For pain    . dicyclomine (BENTYL) 10 MG capsule Take 1 capsule (10 mg total) by mouth 4 (four) times daily -  before meals and at bedtime. For abdominal cramping and loose stools (Patient taking differently: Take 10 mg by mouth 4 (four) times daily as needed. For abdominal cramping and loose stools) 120 capsule 5  . diltiazem (CARDIZEM CD) 120 MG 24 hr capsule TAKE ONE CAPSULE BY MOUTH EVERY DAY 90 capsule 3  . docusate sodium (COLACE) 100 MG capsule Take 100 mg by mouth 2 (two) times daily.    . flecainide (TAMBOCOR) 50 MG tablet TAKE TWO TABLETS BY MOUTH TWICE DAILY 120 tablet 3  . fluticasone (FLONASE) 50 MCG/ACT nasal spray Place 2 sprays into both nostrils daily.  6  . furosemide (LASIX) 80 MG tablet Take 80 mg by mouth 2 (two) times daily.      Marland Kitchen gabapentin (NEURONTIN) 600 MG tablet Take 600 mg by mouth 4 (four) times daily.    Marland Kitchen glimepiride (AMARYL) 2 MG tablet Take 2-4 mg by mouth 2 (two) times daily. 61m in the morning and 25min the evening    . hydrocortisone (PROCTOSOL HC) 2.5 % rectal cream Place 1 application rectally 2 (two) times daily. (Patient taking differently: Place 1 application rectally daily as needed for hemorrhoids. ) 30 g 0  . isosorbide mononitrate (IMDUR) 120 MG 24 hr tablet Take 120 mg by mouth every morning.     . isosorbide mononitrate (IMDUR) 30 MG 24 hr tablet Take 30 mg by mouth every evening.     . Marland KitchenINZESS 290 MCG CAPS capsule TAKE ONE CAPSULE BY MOUTH ONCE A DAY BEFORE BREAKFAST - REPLACES MOVANTIK 30 capsule 11  . lisinopril (PRINIVIL,ZESTRIL) 10 MG tablet Take 10 mg by mouth at bedtime.     . Marland Kitchenoratadine (CLARITIN) 10 MG tablet Take 10 mg by mouth daily.      . meclizine (ANTIVERT) 25 MG tablet Take 25 mg by mouth 3 (three) times daily as needed for dizziness.     . metFORMIN (GLUCOPHAGE) 500 MG tablet Take 1,000 mg by mouth 2 (two) times daily.     .  Multiple Vitamin (MULTIVITAMIN WITH MINERALS) TABS tablet Take 1 tablet by mouth daily.    . Na Sulfate-K Sulfate-Mg Sulf (SUPREP BOWEL PREP KIT) 17.5-3.13-1.6 GM/180ML SOLN Take 1 kit by mouth as directed. 1 Bottle 0  . NEXIUM 40 MG capsule TAKE ONE CAPSULE BY MOUTH ONCE A DAY BEFORE BREAKFAST 30 capsule 5  . nitroGLYCERIN (NITROLINGUAL) 0.4 MG/SPRAY spray USE 1 SPRAY UNDER THE TONGUE AS NEEDED FOR CHEST PAIN 12 g 3  . NON FORMULARY BIPAP - STARTED ABOUT 1 1/2 years AGO.    . Marland KitchenUCYNTA ER 150 MG TB12 Take 1 tablet by mouth 2 (two) times daily.     . Marland Kitchenystatin (MYCOSTATIN/NYSTOP) 100000 UNIT/GM POWD Apply 1 g topically daily.     . Marland Kitchenmega-3 acid ethyl esters (LOVAZA) 1 g capsule  Take 1 capsule by mouth 4 (four) times daily.    . ondansetron (ZOFRAN) 4 MG tablet TAKE 1 TABLET BY MOUTH EVERY 8 HOURS AS NEEDED FOR NAUSEA OR VOMITING 30 tablet 3  . oxyCODONE-acetaminophen (PERCOCET) 10-325 MG per tablet Take 1 tablet by mouth every 6 (six) hours as needed. For pain.    . polyethylene glycol powder (GLYCOLAX/MIRALAX) powder Take 17 g by mouth daily.     . potassium chloride SA (K-DUR,KLOR-CON) 20 MEQ tablet Take 40 mEq by mouth 4 (four) times daily.      . ranitidine (ZANTAC) 150 MG tablet Take 150 mg by mouth daily as needed for heartburn.     . Rivaroxaban (XARELTO) 20 MG TABS Take 20 mg by mouth every evening.     Marland Kitchen spironolactone (ALDACTONE) 50 MG tablet Take 50 mg by mouth 3 (three) times daily.      Marland Kitchen buPROPion (WELLBUTRIN) 75 MG tablet Take 1 tablet (75 mg total) by mouth daily. 30 tablet 3   No current facility-administered medications for this visit.    Allergies:  Hyoscyamine sulfate and Reglan [metoclopramide]   Social History: The patient  reports that she quit smoking about 9 years ago. Her smoking use included Cigarettes. She started smoking about 39 years ago. She has a 45.00 pack-year smoking history. She has never used smokeless tobacco. She reports that she does not drink alcohol or use  drugs.   ROS:  Please see the history of present illness. Otherwise, complete review of systems is positive for chronic leg and back pain.  All other systems are reviewed and negative.   Physical Exam: VS:  BP 138/60   Pulse 71   Ht '5\' 9"'  (1.753 m)   Wt (!) 308 lb (139.7 kg)   SpO2 97%   BMI 45.48 kg/m , BMI Body mass index is 45.48 kg/m.  Wt Readings from Last 3 Encounters:  10/30/16 (!) 308 lb (139.7 kg)  08/16/16 (!) 322 lb 3.2 oz (146.1 kg)  08/08/16 (!) 309 lb (140.2 kg)    General: Obese woman, appears comfortable at rest. HEENT: Conjunctiva and lids normal, oropharynx clear. Neck: Supple, no elevated JVP or carotid bruits, no thyromegaly. Lungs: Clear to auscultation, nonlabored breathing at rest. Cardiac: Regular rate and rhythm, no S3 or significant systolic murmur, no pericardial rub. Abdomen: Soft, nontender, bowel sounds present. Extremities: No pitting edema, distal pulses 2+. Skin: Warm and dry. Musculoskeletal: No kyphosis. Neuropsychiatric: Alert and oriented x3, affect grossly appropriate.  ECG: I personally reviewed the tracing from 09/15/2015 which showed sinus rhythm with prolonged PR interval and increased voltage.  Recent Labwork:  September 2017: Potassium 3.5, BUN 8, creatinine 0.6, hemoglobin 11.9, platelets 273  Other Studies Reviewed Today:  Echocardiogram 09/15/2015: Study Conclusions  - Left ventricle: The cavity size was normal. Systolic function was   normal. The estimated ejection fraction was in the range of 60%   to 65%. Wall motion was normal; there were no regional wall   motion abnormalities. Doppler parameters are consistent with   abnormal left ventricular relaxation (grade 1 diastolic   dysfunction). Mild concentric and moderate focal basal septal   hypertrophy.  Assessment and Plan:  1.  Paroxysmal atrial fibrillation, maintaining sinus rhythm on current therapy including flecainide, Cardizem CD, and Xarelto.  Follow-up CBC  and BMET.  ECG reviewed today.  2.  History of atypical chest pain and previously documented normal coronary arteries.  No progressive symptoms.  3.  Morbid obesity and OSA.  She uses BiPAP.  4.  Essential hypertension, blood pressure adequately controlled today.  Current medicines were reviewed with the patient today.   Orders Placed This Encounter  Procedures  . EKG 12-Lead    Disposition: Follow-up in 6 months.  Signed, Satira Sark, MD, [facility] 10/30/2016 4:37 PM    [facility] [place], [place], [place] [address] Phone: [phone]; Fax: [phone]

## 2016-10-30 ENCOUNTER — Encounter: Payer: Self-pay | Admitting: Cardiology

## 2016-10-30 ENCOUNTER — Ambulatory Visit (INDEPENDENT_AMBULATORY_CARE_PROVIDER_SITE_OTHER): Payer: Medicare Other | Admitting: Cardiology

## 2016-10-30 VITALS — BP 138/60 | HR 71 | Ht 69.0 in | Wt 308.0 lb

## 2016-10-30 DIAGNOSIS — I10 Essential (primary) hypertension: Secondary | ICD-10-CM | POA: Diagnosis not present

## 2016-10-30 DIAGNOSIS — G4733 Obstructive sleep apnea (adult) (pediatric): Secondary | ICD-10-CM | POA: Diagnosis not present

## 2016-10-30 DIAGNOSIS — I480 Paroxysmal atrial fibrillation: Secondary | ICD-10-CM | POA: Diagnosis not present

## 2016-10-30 DIAGNOSIS — R0789 Other chest pain: Secondary | ICD-10-CM | POA: Diagnosis not present

## 2016-10-30 DIAGNOSIS — I208 Other forms of angina pectoris: Secondary | ICD-10-CM | POA: Diagnosis not present

## 2016-10-30 DIAGNOSIS — Z23 Encounter for immunization: Secondary | ICD-10-CM

## 2016-10-30 NOTE — Patient Instructions (Signed)
Medication Instructions:  Your physician recommends that you continue on your current medications as directed. Please refer to the Current Medication list given to you today.  Labwork: CBC, BMET- Orders given today   Testing/Procedures: NONE  Follow-Up: Your physician wants you to follow-up in: 6 MONTHS WITH DR. MCDOWELL. You will receive a reminder letter in the mail two months in advance. If you don't receive a letter, please call our office to schedule the follow-up appointment.  Any Other Special Instructions Will Be Listed Below (If Applicable).  If you need a refill on your cardiac medications before your next appointment, please call your pharmacy. 

## 2016-11-01 ENCOUNTER — Other Ambulatory Visit: Payer: Self-pay | Admitting: Cardiology

## 2016-11-01 ENCOUNTER — Ambulatory Visit (INDEPENDENT_AMBULATORY_CARE_PROVIDER_SITE_OTHER): Payer: Medicare Other | Admitting: Nurse Practitioner

## 2016-11-01 ENCOUNTER — Encounter: Payer: Self-pay | Admitting: Nurse Practitioner

## 2016-11-01 DIAGNOSIS — I208 Other forms of angina pectoris: Secondary | ICD-10-CM

## 2016-11-01 DIAGNOSIS — R69 Illness, unspecified: Secondary | ICD-10-CM | POA: Diagnosis not present

## 2016-11-01 DIAGNOSIS — Z86010 Personal history of colonic polyps: Secondary | ICD-10-CM | POA: Diagnosis not present

## 2016-11-01 DIAGNOSIS — I480 Paroxysmal atrial fibrillation: Secondary | ICD-10-CM | POA: Diagnosis not present

## 2016-11-01 LAB — BASIC METABOLIC PANEL WITH GFR
BUN: 13 mg/dL (ref 7–25)
CO2: 27 mmol/L (ref 20–32)
CREATININE: 0.55 mg/dL (ref 0.50–1.05)
Calcium: 9.5 mg/dL (ref 8.6–10.4)
Chloride: 101 mmol/L (ref 98–110)
GFR, EST NON AFRICAN AMERICAN: 106 mL/min/{1.73_m2} (ref >=60)
GFR, Est African American: 122 mL/min/{1.73_m2} (ref >=60)
GLUCOSE: 194 mg/dL — AB (ref 65–139)
Potassium: 4.2 mmol/L (ref 3.5–5.3)
Sodium: 136 mmol/L (ref 135–146)

## 2016-11-01 LAB — CBC
HEMATOCRIT: 34.4 % — AB (ref 35.0–45.0)
HEMOGLOBIN: 11.5 g/dL — AB (ref 11.7–15.5)
MCH: 27.9 pg (ref 27.0–33.0)
MCHC: 33.4 g/dL (ref 32.0–36.0)
MCV: 83.5 fL (ref 80.0–100.0)
MPV: 9.8 fL (ref 7.5–12.5)
Platelets: 318 10*3/uL (ref 140–400)
RBC: 4.12 10*6/uL (ref 3.80–5.10)
RDW: 13 % (ref 11.0–15.0)
WBC: 10.7 10*3/uL (ref 3.8–10.8)

## 2016-11-01 NOTE — Patient Instructions (Addendum)
1. We will schedule your procedure for you. 2. Continue your current medications. 3. Stop taking your Xarelto for 2 days prior to your procedure. 4. We will have you drink clear liquids for 2 days prior to your procedure. 5. We will have you take MiraLAX twice a day for the 5 days prior to your procedure. 6. We will give you reinforced and instructions, but do not take your diabetes medicines the night before your procedure or the morning of your procedure. 7. Further recommendations will be made based on the results of your procedure. 8. Return for follow-up based on recommendations made after your procedure.

## 2016-11-01 NOTE — Progress Notes (Signed)
Referring Provider: Glenda Chroman, MD Primary Care Physician:  Glenda Chroman, MD Primary GI:  Dr. Gala Romney  Chief Complaint  Patient presents with  . Colonoscopy    HPI:   [name] [name] is a 56 y.o. female who presents to reschedule colonoscopy. The patient was last seen in our office 08/08/2016 for history of colon polyps, drug-induced constipation, gastroparesis, GERD. Noted history of multiple colonic adenomas removed in 2015 and a positive family history of colon cancer in her maternal grandmother and paternal aunts. Reflux well controlled. Recommended continue omeprazole, Linzess, MiraLAX, Colace. Scheduled surveillance colonoscopy on propofol with extended prep including an extra full day of clear liquids, MiraLAX twice a day for 5 days prior to procedure. Stop Xarelto 2 days prior to procedure.  Last colonoscopy completed 06/25/2013 which found adequate prep, anal canal hemorrhoids, redundant colon, a single 7 mm polyp in the mid sigmoid segment, a single 8 mm polyp in the mid descending segment, otherwise normal. Surgical pathology found the polyps to be tubular adenoma. Recommended 3 year repeat colonoscopy.  She cancel her procedure due to acute illness and stress from moving. Recommended office visit to reschedule due to chronic polypharmacy and need for propofol.  Today she states she's doing well. Denies worsening abdominal pain, hematochezia, melena, fever, chills, unintentional weight loss, acute changes in bowel habits. Has intermittent nausea from gastroparesis but no recent vomiting. Overall, states nothing has really changed in the past few months. Remains on Xarelto for AFib. Just saw cardiology and was told it's ok to hold Xarelto, no contraindications to endoscopic evaluation. Denies chest pain, dyspnea, dizziness, lightheadedness, syncope, near syncope. Denies any other upper or lower GI symptoms.  Past Medical History:  Diagnosis Date  . Bladder dysfunction    Self  urinary catheterization  . COPD (chronic obstructive pulmonary disease) ([place])   . DDD (degenerative disc disease)   . Depression   . Esophageal spasm    NTG and Norvasc  . Essential hypertension, benign   . Fibromyalgia   . Gastroparesis   . GERD (gastroesophageal reflux disease)   . History of cardiac catheterization    Normal coronary arteries 11/12  . History of pituitary tumor   . Irritable bowel syndrome   . Lymphedema   . Osteoarthritis   . PAF (paroxysmal atrial fibrillation) ([place])   . Sleep apnea    BIPAP  . Type 2 diabetes mellitus ([place])   . Urinary tract infection     Past Surgical History:  Procedure Laterality Date  . ABDOMINAL HYSTERECTOMY    . ANTERIOR CERVICAL DECOMP/DISCECTOMY FUSION N/A 12/02/2014   Procedure: Cervical five cervical six anterior cervical decompression with fusion interbody prosthesis plating and bone graft;  Surgeon: Newman Pies, MD;  Location: [facility];  Service: Neurosurgery;  Laterality: N/A;  C56 anterior cervical decompression with fusion interbody prosthesis plating and bonegraft  . APPENDECTOMY    . BACK SURGERY    . CARDIAC CATHETERIZATION    . CARDIOVERSION N/A 03/26/2014   Procedure: CARDIOVERSION;  Surgeon: Herminio Commons, MD;  Location: [facility];  Service: Endoscopy;  Laterality: N/A;  . CARDIOVERSION N/A 05/04/2014   Procedure: CARDIOVERSION;  Surgeon: Satira Sark, MD;  Location: [facility];  Service: Cardiovascular;  Laterality: N/A;  . CHOLECYSTECTOMY    . COLONOSCOPY     Approximately 2003. Per medical records, internal hemorrhoids noted  . COLONOSCOPY N/A 06/25/2013   Dr. Gala Romney: Anal canal hemorrhoids-more likely the source of paper hematochezia. Redundant,  capacious colon. Multiple colonic polyps-tubular adenoma  . ESOPHAGOGASTRODUODENOSCOPY  10/12/2004   ZOX:WRUE plaquing on the esophageal mucosa of uncertain significance, not typical of what is seen with candida esophagitis status post KOH brushing for KOH prep  and biopsy for histology. Rule out candida esophagitis/eosinophilic esophagitis. Otherwise normal esophagus. Tiny hiatal hernia. Otherwise, normal stomach, normal D1 and D2. Benign biopsy of esophagus, unknown KOH status.   . ESOPHAGOGASTRODUODENOSCOPY N/A 06/25/2013   Dr. Rourk:mild chronic gastritis  . KNEE SURGERY    . LEFT HEART CATHETERIZATION WITH CORONARY ANGIOGRAM N/A 11/15/2010   Procedure: LEFT HEART CATHETERIZATION WITH CORONARY ANGIOGRAM;  Surgeon: Laverda Page, MD;  Location: [facility];  Service: Cardiovascular;  Laterality: N/A;  . LUNG BIOPSY      Current Outpatient Prescriptions  Medication Sig Dispense Refill  . albuterol (PROAIR HFA) 108 (90 BASE) MCG/ACT inhaler Inhale 2 puffs into the lungs every 6 (six) hours as needed for wheezing or shortness of breath.     Marland Kitchen albuterol (PROVENTIL) (2.5 MG/3ML) 0.083% nebulizer solution Take 2.5 mg by nebulization every 6 (six) hours as needed for wheezing. For shortness of breath.    Marland Kitchen atorvastatin (LIPITOR) 10 MG tablet Take 10 mg by mouth at bedtime.  3  . baclofen (LIORESAL) 20 MG tablet Take 20 mg by mouth 3 (three) times daily.      Marland Kitchen buPROPion (WELLBUTRIN) 75 MG tablet Take 1 tablet (75 mg total) by mouth daily. 30 tablet 3  . diclofenac sodium (VOLTAREN) 1 % GEL Apply 2-4 g topically 4 (four) times daily as needed. For pain    . dicyclomine (BENTYL) 10 MG capsule Take 1 capsule (10 mg total) by mouth 4 (four) times daily -  before meals and at bedtime. For abdominal cramping and loose stools (Patient taking differently: Take 10 mg by mouth 4 (four) times daily as needed. For abdominal cramping and loose stools) 120 capsule 5  . diltiazem (CARDIZEM CD) 120 MG 24 hr capsule TAKE ONE CAPSULE BY MOUTH EVERY DAY 90 capsule 3  . docusate sodium (COLACE) 100 MG capsule Take 100 mg by mouth 2 (two) times daily.    . flecainide (TAMBOCOR) 50 MG tablet TAKE TWO TABLETS BY MOUTH TWICE DAILY 120 tablet 3  . fluticasone (FLONASE) 50  MCG/ACT nasal spray Place 2 sprays into both nostrils daily.  6  . furosemide (LASIX) 80 MG tablet Take 80 mg by mouth 2 (two) times daily.      Marland Kitchen gabapentin (NEURONTIN) 600 MG tablet Take 600 mg by mouth 4 (four) times daily.    Marland Kitchen glimepiride (AMARYL) 2 MG tablet Take 2-4 mg by mouth 2 (two) times daily. 52m in the morning and 280min the evening    . hydrocortisone (PROCTOSOL HC) 2.5 % rectal cream Place 1 application rectally 2 (two) times daily. (Patient taking differently: Place 1 application rectally daily as needed for hemorrhoids. ) 30 g 0  . isosorbide mononitrate (IMDUR) 120 MG 24 hr tablet Take 120 mg by mouth every morning.     . isosorbide mononitrate (IMDUR) 30 MG 24 hr tablet Take 30 mg by mouth every evening.     . Marland KitchenINZESS 290 MCG CAPS capsule TAKE ONE CAPSULE BY MOUTH ONCE A DAY BEFORE BREAKFAST - REPLACES MOVANTIK 30 capsule 11  . lisinopril (PRINIVIL,ZESTRIL) 10 MG tablet Take 10 mg by mouth at bedtime.     . Marland Kitchenoratadine (CLARITIN) 10 MG tablet Take 10 mg by mouth daily.      .Marland Kitchen  meclizine (ANTIVERT) 25 MG tablet Take 25 mg by mouth 3 (three) times daily as needed for dizziness.     . metFORMIN (GLUCOPHAGE) 500 MG tablet Take 1,000 mg by mouth 2 (two) times daily.     . Multiple Vitamin (MULTIVITAMIN WITH MINERALS) TABS tablet Take 1 tablet by mouth daily.    Marland Kitchen NEXIUM 40 MG capsule TAKE ONE CAPSULE BY MOUTH ONCE A DAY BEFORE BREAKFAST 30 capsule 5  . nitroGLYCERIN (NITROLINGUAL) 0.4 MG/SPRAY spray USE 1 SPRAY UNDER THE TONGUE AS NEEDED FOR CHEST PAIN 12 g 3  . NON FORMULARY BIPAP - STARTED ABOUT 1 1/2 years AGO.    Marland Kitchen NUCYNTA ER 150 MG TB12 Take 1 tablet by mouth 2 (two) times daily.     Marland Kitchen nystatin (MYCOSTATIN/NYSTOP) 100000 UNIT/GM POWD Apply 1 g topically daily.     Marland Kitchen omega-3 acid ethyl esters (LOVAZA) 1 g capsule Take 1 capsule by mouth 4 (four) times daily.    . ondansetron (ZOFRAN) 4 MG tablet TAKE 1 TABLET BY MOUTH EVERY 8 HOURS AS NEEDED FOR NAUSEA OR VOMITING 30 tablet 3  .  oxyCODONE-acetaminophen (PERCOCET) 10-325 MG per tablet Take 1 tablet by mouth every 6 (six) hours as needed. For pain.    . polyethylene glycol powder (GLYCOLAX/MIRALAX) powder Take 17 g by mouth daily.     . potassium chloride SA (K-DUR,KLOR-CON) 20 MEQ tablet Take 40 mEq by mouth 4 (four) times daily.      . ranitidine (ZANTAC) 150 MG tablet Take 150 mg by mouth daily as needed for heartburn.     . Rivaroxaban (XARELTO) 20 MG TABS Take 20 mg by mouth every evening.     Marland Kitchen spironolactone (ALDACTONE) 50 MG tablet Take 50 mg by mouth 3 (three) times daily.      . Na Sulfate-K Sulfate-Mg Sulf (SUPREP BOWEL PREP KIT) 17.5-3.13-1.6 GM/180ML SOLN Take 1 kit by mouth as directed. (Patient not taking: Reported on 11/01/2016) 1 Bottle 0   No current facility-administered medications for this visit.     Allergies as of 11/01/2016 - Review Complete 11/01/2016  Allergen Reaction Noted  . Hyoscyamine sulfate Other (See Comments)   . Reglan [metoclopramide] Other (See Comments) 11/05/2014    Family History  Problem Relation Age of Onset  . Coronary artery disease Father        Died age 68  . Heart attack Father   . Arrhythmia Father        AF  . Diabetes Father   . Parkinson's disease Father   . Arrhythmia Mother        AF  . Stroke Mother   . Dementia Mother   . Cancer Mother        UTERINE   . Parkinson's disease Mother   . Arrhythmia Brother        AF  . Colon cancer Maternal Grandfather   . Colon cancer Paternal Aunt   . Colon cancer Paternal Aunt   . Diabetes Son   . Cancer Sister        BREAST   . Depression Sister   . Depression Sister   . Depression Sister     Social History   Social History  . Marital status: Divorced    Spouse name: N/A  . Number of children: N/A  . Years of education: N/A   Social History Main Topics  . Smoking status: Former Smoker    Packs/day: 1.50    Years: 30.00    Types:  Cigarettes    Start date: 03/06/1977    Quit date: 01/03/2007  .  Smokeless tobacco: Never Used  . Alcohol use No  . Drug use: No  . Sexual activity: No   Other Topics Concern  . None   Social History Narrative  . None    Review of Systems: General: Negative for anorexia, weight loss, fever, chills, fatigue, weakness. ENT: Negative for hoarseness, difficulty swallowing. CV: Negative for chest pain, angina, palpitations, peripheral edema.  Respiratory: Negative for dyspnea at rest, cough, sputum, wheezing.  GI: See history of present illness. MS: Admits chronic joint pain.  Derm: Negative for rash or itching.  Endo: Negative for unusual weight change.  Heme: Negative for bruising or bleeding.   Physical Exam: BP 122/65   Pulse 72   Temp (!) 97.3 F (36.3 C) (Oral)   Ht 5' 8" (1.727 m)   Wt (!) 315 lb 9.6 oz (143.2 kg)   BMI 47.99 kg/m  General:   Obese female. Alert and oriented. Pleasant and cooperative. Well-nourished and well-developed.  Eyes:  Without icterus, sclera clear and conjunctiva pink.  Ears:  Normal auditory acuity. Mouth:  No deformity or lesions, oral mucosa pink.  Throat/Neck:  Supple, without mass or thyromegaly. Cardiovascular:  S1, S2 present with 2/6 blowing systolic murmur appreciated. Extremities without clubbing or edema. Respiratory:  Clear to auscultation bilaterally. No wheezes, rales, or rhonchi. No distress.  Gastrointestinal:  +BS, obese but soft, non-tender and non-distended. No HSM noted. No guarding or rebound. No masses appreciated.  Rectal:  Deferred  Musculoskalatal:  Symmetrical without gross deformities. Normal posture. Neurologic:  Alert and oriented x4;  grossly normal neurologically. Psych:  Alert and cooperative. Normal mood and affect. Heme/Lymph/Immune: No excessive bruising noted.    11/01/2016 3:20 PM   Disclaimer: This note was dictated with voice recognition software. Similar sounding words can inadvertently be transcribed and may not be corrected upon review.

## 2016-11-01 NOTE — Progress Notes (Signed)
cc'ed to pcp °

## 2016-11-01 NOTE — Assessment & Plan Note (Signed)
Multiple medications including sedating medications which will require sedation augmentation on propofol/MAC for her colonoscopy. Colonoscopy being done for surveillance with a history of colon polyps. We will plan for the procedure on propofol/MAC to promote adequate sedation.

## 2016-11-01 NOTE — Assessment & Plan Note (Addendum)
History of tubular adenoma colon polyps which were moderately sized at 7 and 8 mm. Recommended 3 year repeat colonoscopy. She is on multiple medications requiring augmented sedation. We will proceed with colonoscopy at this time.  Proceed with TCS on propofol/MAC with Dr. Gala Romney in near future: the risks, benefits, and alternatives have been discussed with the patient in detail. The patient states understanding and desires to proceed.  The patient is currently on Xarelto for atrial fibrillation. We will have her hold this for 2 days prior to her procedure. The patient is also on Wellbutrin and oxycodone. No other anticoagulants, anxiolytics, chronic pain medications, or antidepressants. We will plan for the procedure on propofol/MAC to promote adequate sedation.  Plan for extended prep including 2 days of clear liquids and 5 days of MiraLAX twice daily aspirin previous recommendations.

## 2016-11-02 ENCOUNTER — Telehealth: Payer: Self-pay

## 2016-11-02 ENCOUNTER — Encounter: Payer: Self-pay | Admitting: *Deleted

## 2016-11-02 ENCOUNTER — Telehealth: Payer: Self-pay | Admitting: *Deleted

## 2016-11-02 ENCOUNTER — Other Ambulatory Visit: Payer: Self-pay | Admitting: *Deleted

## 2016-11-02 DIAGNOSIS — Z86010 Personal history of colonic polyps: Secondary | ICD-10-CM

## 2016-11-02 NOTE — Telephone Encounter (Signed)
Patient notified. Routed to PCP 

## 2016-11-02 NOTE — Telephone Encounter (Signed)
Called made patient aware of pre-op appt is for 11/29/16 at 11am over at [place] stay. Letter also mailed to patient. She already had her prep on hand and did not need another one sent in. New instructions also mailed to her.

## 2016-11-02 NOTE — Telephone Encounter (Signed)
-----   Message from Acquanetta Chain, LPN sent at 35/06/8614  8:51 AM EDT -----   ----- Message ----- From: Satira Sark, MD Sent: 11/02/2016   8:27 AM To: Merlene Laughter, LPN  Results reviewed.  Renal function remains normal as does potassium.  She is mildly anemic with hemoglobin 11.5 but has GI workup pending.  No change in current regimen. A copy of this test should be forwarded to Glenda Chroman, MD.

## 2016-11-14 DIAGNOSIS — G894 Chronic pain syndrome: Secondary | ICD-10-CM | POA: Diagnosis not present

## 2016-11-14 DIAGNOSIS — E1142 Type 2 diabetes mellitus with diabetic polyneuropathy: Secondary | ICD-10-CM | POA: Diagnosis not present

## 2016-11-14 DIAGNOSIS — F4321 Adjustment disorder with depressed mood: Secondary | ICD-10-CM | POA: Diagnosis not present

## 2016-11-14 DIAGNOSIS — Z79891 Long term (current) use of opiate analgesic: Secondary | ICD-10-CM | POA: Diagnosis not present

## 2016-11-21 DIAGNOSIS — I10 Essential (primary) hypertension: Secondary | ICD-10-CM | POA: Diagnosis not present

## 2016-11-21 DIAGNOSIS — E119 Type 2 diabetes mellitus without complications: Secondary | ICD-10-CM | POA: Diagnosis not present

## 2016-11-21 DIAGNOSIS — I2510 Atherosclerotic heart disease of native coronary artery without angina pectoris: Secondary | ICD-10-CM | POA: Diagnosis not present

## 2016-11-27 ENCOUNTER — Other Ambulatory Visit: Payer: Self-pay | Admitting: Internal Medicine

## 2016-11-27 DIAGNOSIS — Z1231 Encounter for screening mammogram for malignant neoplasm of breast: Secondary | ICD-10-CM

## 2016-11-28 ENCOUNTER — Encounter (HOSPITAL_COMMUNITY): Payer: Self-pay

## 2016-11-29 ENCOUNTER — Encounter (HOSPITAL_COMMUNITY)
Admission: RE | Admit: 2016-11-29 | Discharge: 2016-11-29 | Disposition: A | Discharge status: Home / Self Care | Payer: Medicare Other | Source: Ambulatory Visit | Attending: Internal Medicine | Admitting: Internal Medicine

## 2016-12-04 ENCOUNTER — Ambulatory Visit (HOSPITAL_COMMUNITY)
Admission: RE | Admit: 2016-12-04 | Discharge: 2016-12-04 | Disposition: A | Discharge status: Home / Self Care | Payer: Medicare Other | Source: Ambulatory Visit | Attending: Internal Medicine | Admitting: Internal Medicine

## 2016-12-04 ENCOUNTER — Ambulatory Visit (HOSPITAL_COMMUNITY): Payer: Medicare Other | Admitting: Anesthesiology

## 2016-12-04 ENCOUNTER — Encounter (HOSPITAL_COMMUNITY): Payer: Self-pay | Admitting: *Deleted

## 2016-12-04 ENCOUNTER — Encounter (HOSPITAL_COMMUNITY)
Admission: RE | Disposition: A | Discharge status: Home / Self Care | Payer: Self-pay | Source: Ambulatory Visit | Attending: Internal Medicine

## 2016-12-04 DIAGNOSIS — K5730 Diverticulosis of large intestine without perforation or abscess without bleeding: Secondary | ICD-10-CM | POA: Diagnosis not present

## 2016-12-04 DIAGNOSIS — M797 Fibromyalgia: Secondary | ICD-10-CM | POA: Diagnosis not present

## 2016-12-04 DIAGNOSIS — K3184 Gastroparesis: Secondary | ICD-10-CM | POA: Diagnosis not present

## 2016-12-04 DIAGNOSIS — J449 Chronic obstructive pulmonary disease, unspecified: Secondary | ICD-10-CM | POA: Insufficient documentation

## 2016-12-04 DIAGNOSIS — Z86010 Personal history of colonic polyps: Secondary | ICD-10-CM

## 2016-12-04 DIAGNOSIS — G4730 Sleep apnea, unspecified: Secondary | ICD-10-CM | POA: Insufficient documentation

## 2016-12-04 DIAGNOSIS — Z7901 Long term (current) use of anticoagulants: Secondary | ICD-10-CM | POA: Diagnosis not present

## 2016-12-04 DIAGNOSIS — Z7984 Long term (current) use of oral hypoglycemic drugs: Secondary | ICD-10-CM | POA: Diagnosis not present

## 2016-12-04 DIAGNOSIS — E1143 Type 2 diabetes mellitus with diabetic autonomic (poly)neuropathy: Secondary | ICD-10-CM | POA: Diagnosis not present

## 2016-12-04 DIAGNOSIS — I480 Paroxysmal atrial fibrillation: Secondary | ICD-10-CM | POA: Diagnosis not present

## 2016-12-04 DIAGNOSIS — M1990 Unspecified osteoarthritis, unspecified site: Secondary | ICD-10-CM | POA: Diagnosis not present

## 2016-12-04 DIAGNOSIS — Z1211 Encounter for screening for malignant neoplasm of colon: Secondary | ICD-10-CM | POA: Insufficient documentation

## 2016-12-04 DIAGNOSIS — K219 Gastro-esophageal reflux disease without esophagitis: Secondary | ICD-10-CM | POA: Insufficient documentation

## 2016-12-04 DIAGNOSIS — Z87891 Personal history of nicotine dependence: Secondary | ICD-10-CM | POA: Diagnosis not present

## 2016-12-04 DIAGNOSIS — K589 Irritable bowel syndrome without diarrhea: Secondary | ICD-10-CM | POA: Insufficient documentation

## 2016-12-04 DIAGNOSIS — F329 Major depressive disorder, single episode, unspecified: Secondary | ICD-10-CM | POA: Diagnosis not present

## 2016-12-04 DIAGNOSIS — I10 Essential (primary) hypertension: Secondary | ICD-10-CM | POA: Diagnosis not present

## 2016-12-04 DIAGNOSIS — Z79899 Other long term (current) drug therapy: Secondary | ICD-10-CM | POA: Insufficient documentation

## 2016-12-04 HISTORY — PX: COLONOSCOPY WITH PROPOFOL: SHX5780

## 2016-12-04 LAB — GLUCOSE, CAPILLARY
Glucose-Capillary: 140 mg/dL — ABNORMAL HIGH (ref 65–99)
Glucose-Capillary: 129 mg/dL — ABNORMAL HIGH (ref 65–99)

## 2016-12-04 SURGERY — COLONOSCOPY WITH PROPOFOL
Anesthesia: Monitor Anesthesia Care

## 2016-12-04 MED ORDER — MIDAZOLAM HCL 2 MG/2ML IJ SOLN: 1.0000 mg | Freq: Once | INTRAMUSCULAR | Status: DC | PRN

## 2016-12-04 MED ORDER — PROPOFOL 10 MG/ML IV BOLUS
INTRAVENOUS | Status: DC | PRN
  Administered 2016-12-04: 20 mg via INTRAVENOUS
  Administered 2016-12-04: 10 mg via INTRAVENOUS
  Administered 2016-12-04 (×2): 20 mg via INTRAVENOUS
  Administered 2016-12-04: 10 mg via INTRAVENOUS

## 2016-12-04 MED ORDER — LACTATED RINGERS IV SOLN: INTRAVENOUS | Status: DC

## 2016-12-04 MED ORDER — PROPOFOL 500 MG/50ML IV EMUL
INTRAVENOUS | Status: DC | PRN
  Administered 2016-12-04 (×3): 150 ug/kg/min via INTRAVENOUS

## 2016-12-04 MED ORDER — LACTATED RINGERS IV SOLN
INTRAVENOUS | Status: DC | PRN
  Administered 2016-12-04: 13:00:00 via INTRAVENOUS

## 2016-12-04 MED ORDER — ONDANSETRON 4 MG PO TBDP: 4.0000 mg | ORAL_TABLET | Freq: Once | ORAL | Status: DC

## 2016-12-04 MED ORDER — CHLORHEXIDINE GLUCONATE CLOTH 2 % EX PADS: 6.0000 | MEDICATED_PAD | Freq: Once | CUTANEOUS | Status: DC

## 2016-12-04 MED ORDER — PROPOFOL 10 MG/ML IV BOLUS
INTRAVENOUS | Status: AC
  Filled 2016-12-04: qty 40

## 2016-12-04 NOTE — H&P (Signed)
'@LOGO' @   Primary Care Physician:  Glenda Chroman, MD Primary Gastroenterologist:  Dr. Gala Romney  Pre-Procedure History & Physical: HPI:  [name] [name] is a 56 y.o. female here for a surveillance colonoscopy.  History of multiple colonic adenomas removed in 2015.  Here for a surveillance examination.  Past Medical History:  Diagnosis Date  . Bladder dysfunction    Self urinary catheterization  . COPD (chronic obstructive pulmonary disease) ([place])   . DDD (degenerative disc disease)   . Depression   . Esophageal spasm    NTG and Norvasc  . Essential hypertension, benign   . Fibromyalgia   . Gastroparesis   . GERD (gastroesophageal reflux disease)   . History of cardiac catheterization    Normal coronary arteries 11/12  . History of pituitary tumor   . Irritable bowel syndrome   . Lymphedema   . Osteoarthritis   . PAF (paroxysmal atrial fibrillation) ([place])   . Sleep apnea    BIPAP  . Type 2 diabetes mellitus ([place])   . Urinary tract infection     Past Surgical History:  Procedure Laterality Date  . ABDOMINAL HYSTERECTOMY    . ANTERIOR CERVICAL DECOMP/DISCECTOMY FUSION N/A 12/02/2014   Procedure: Cervical five cervical six anterior cervical decompression with fusion interbody prosthesis plating and bone graft;  Surgeon: Newman Pies, MD;  Location: [facility];  Service: Neurosurgery;  Laterality: N/A;  C56 anterior cervical decompression with fusion interbody prosthesis plating and bonegraft  . APPENDECTOMY    . BACK SURGERY    . CARDIAC CATHETERIZATION    . CARDIOVERSION N/A 03/26/2014   Procedure: CARDIOVERSION;  Surgeon: Herminio Commons, MD;  Location: [facility];  Service: Endoscopy;  Laterality: N/A;  . CARDIOVERSION N/A 05/04/2014   Procedure: CARDIOVERSION;  Surgeon: Satira Sark, MD;  Location: [facility];  Service: Cardiovascular;  Laterality: N/A;  . CHOLECYSTECTOMY    . COLONOSCOPY     Approximately 2003. Per medical records, internal hemorrhoids noted  .  COLONOSCOPY N/A 06/25/2013   Dr. Gala Romney: Anal canal hemorrhoids-more likely the source of paper hematochezia. Redundant, capacious colon. Multiple colonic polyps-tubular adenoma  . ESOPHAGOGASTRODUODENOSCOPY  10/12/2004   CBU:LAGT plaquing on the esophageal mucosa of uncertain significance, not typical of what is seen with candida esophagitis status post KOH brushing for KOH prep and biopsy for histology. Rule out candida esophagitis/eosinophilic esophagitis. Otherwise normal esophagus. Tiny hiatal hernia. Otherwise, normal stomach, normal D1 and D2. Benign biopsy of esophagus, unknown KOH status.   . ESOPHAGOGASTRODUODENOSCOPY N/A 06/25/2013   Dr. Charlette Hennings:mild chronic gastritis  . KNEE SURGERY    . LEFT HEART CATHETERIZATION WITH CORONARY ANGIOGRAM N/A 11/15/2010   Procedure: LEFT HEART CATHETERIZATION WITH CORONARY ANGIOGRAM;  Surgeon: Laverda Page, MD;  Location: [facility] C[facility] LAB;  Service: Cardiovascular;  Laterality: N/A;  . LUNG BIOPSY      Prior to Admission medications   Medication Sig Start Date End Date Taking? Authorizing Provider  albuterol (PROAIR HFA) 108 (90 BASE) MCG/ACT inhaler Inhale 2 puffs into the lungs every 6 (six) hours as needed for wheezing or shortness of breath.    Yes [provider]  albuterol (PROVENTIL) (2.5 MG/3ML) 0.083% nebulizer solution Take 2.5 mg by nebulization every 6 (six) hours as needed for wheezing. For shortness of breath.   Yes [provider]  atorvastatin (LIPITOR) 10 MG tablet Take 10 mg by mouth at bedtime. 03/12/15  Yes [provider]  baclofen (LIORESAL) 20 MG tablet Take 20 mg by mouth  3 (three) times daily.     Yes [provider]  buPROPion (WELLBUTRIN) 75 MG tablet Take 1 tablet (75 mg total) by mouth daily. 06/01/15 11/21/16 Yes Cloria Spring, MD  diclofenac sodium (VOLTAREN) 1 % GEL Apply 2-4 g topically 4 (four) times daily as needed (for pain).    Yes [provider]  dicyclomine (BENTYL) 10 MG  capsule Take 1 capsule (10 mg total) by mouth 4 (four) times daily -  before meals and at bedtime. For abdominal cramping and loose stools Patient taking differently: Take 10 mg by mouth 4 (four) times daily as needed. For abdominal cramping and loose stools 06/05/13  Yes Annitta Needs, NP  diltiazem (CARDIZEM CD) 120 MG 24 hr capsule TAKE ONE CAPSULE BY MOUTH EVERY DAY Patient taking differently: TAKE 120 MG BY MOUTH EVERY DAY 06/29/16  Yes Satira Sark, MD  docusate sodium (COLACE) 100 MG capsule Take 100 mg by mouth 2 (two) times daily.   Yes [provider]  flecainide (TAMBOCOR) 50 MG tablet TAKE TWO TABLETS BY MOUTH TWICE DAILY Patient taking differently: TAKE 100 MG BY MOUTH TWICE DAILY 08/21/16  Yes Satira Sark, MD  fluticasone [facility]) 50 MCG/ACT nasal spray Place 2 sprays into both nostrils daily. 08/31/16  Yes [provider]  furosemide (LASIX) 80 MG tablet Take 80 mg by mouth 2 (two) times daily.     Yes [provider]  gabapentin (NEURONTIN) 600 MG tablet Take 600 mg by mouth 4 (four) times daily.   Yes [provider]  glimepiride (AMARYL) 2 MG tablet Take 2-4 mg by mouth See admin instructions. Take 23m by mouth in the morning and take 232mby mouth in the evening 05/21/15  Yes [provider]  hydrocortisone (PROCTOSOL HC) 2.5 % rectal cream Place 1 application rectally 2 (two) times daily. Patient taking differently: Place 1 application rectally 2 (two) times daily as needed for hemorrhoids.  08/20/13  Yes BoAnnitta NeedsNP  ibuprofen (ADVIL,MOTRIN) 200 MG tablet Take 400 mg by mouth every 6 (six) hours as needed for headache or moderate pain.   Yes [provider]  isosorbide mononitrate (IMDUR) 120 MG 24 hr tablet Take 120 mg by mouth every morning.    Yes [provider]  isosorbide mononitrate (IMDUR) 30 MG 24 hr tablet Take 30 mg by mouth every evening.    Yes [provider]  LINZESS 290 MCG CAPS  capsule TAKE ONE CAPSULE BY MOUTH ONCE A DAY BEFORE BREAKFAST - REPLACES MOK[place]atient taking differently: TAKE 290 MCG BY MOUTH ONCE A DAY BEFORE BREAKFAST - REPLACES [place]/24/18  Yes LeMahala MenghiniPA-C  lisinopril (PRINIVIL,ZESTRIL) 10 MG tablet Take 10 mg by mouth every evening.    Yes [provider]  loratadine (CLARITIN) 10 MG tablet Take 10 mg by mouth daily.     Yes [provider]  meclizine (ANTIVERT) 25 MG tablet Take 25 mg by mouth 3 (three) times daily as needed for dizziness.  07/31/13  Yes [provider]  metFORMIN (GLUCOPHAGE) 500 MG tablet Take 1,000 mg by mouth 2 (two) times daily.  05/18/13  Yes [provider]  Multiple Vitamin (MULTIVITAMIN WITH MINERALS) TABS tablet Take 1 tablet by mouth daily.   Yes [provider]  Na Sulfate-K Sulfate-Mg Sulf (SUPREP BOWEL PREP KIT) 17.5-3.13-1.6 GM/180ML SOLN Take 1 kit by mouth as directed. 08/08/16  Yes Katherina Wimer, RoCristopher EstimableMD  NEXIUM 40 MG capsule TAKE ONE  CAPSULE BY MOUTH ONCE A DAY BEFORE BREAKFAST Patient taking differently: TAKE 40 MG BY MOUTH ONCE A DAY BEFORE BREAKFAST 08/22/16  Yes Carlis Stable, NP  nitroGLYCERIN (NITROLINGUAL) 0.4 MG/SPRAY spray USE 1 SPRAY UNDER THE TONGUE AS NEEDED FOR CHEST PAIN 08/31/16  Yes Satira Sark, MD  NUCYNTA ER 200 MG TB12 Take 200 mg by mouth every 12 (twelve) hours.  12/05/15  Yes [provider]  nystatin (MYCOSTATIN/NYSTOP) 100000 UNIT/GM POWD Apply 1 g topically daily.    Yes [provider]  omega-3 acid ethyl esters (LOVAZA) 1 g capsule Take 1 g by mouth 4 (four) times daily.  09/13/15  Yes [provider]  ondansetron (ZOFRAN) 4 MG tablet TAKE 1 TABLET BY MOUTH EVERY 8 HOURS AS NEEDED FOR NAUSEA OR VOMITING Patient taking differently: TAKE 4 MG BY MOUTH EVERY 8 HOURS AS NEEDED FOR NAUSEA OR VOMITING 04/26/16  Yes Annitta Needs, NP  oxyCODONE-acetaminophen (PERCOCET) 10-325 MG per tablet Take 1 tablet by mouth every 6  (six) hours as needed for pain.    Yes [provider]  polyethylene glycol powder (GLYCOLAX/MIRALAX) powder Take 34 g by mouth daily.  08/25/13  Yes [provider]  potassium chloride SA (K-DUR,KLOR-CON) 20 MEQ tablet Take 40 mEq by mouth 4 (four) times daily.     Yes [provider]  ranitidine (ZANTAC) 150 MG tablet Take 150 mg by mouth daily as needed for heartburn.    Yes [provider]  Rivaroxaban (XARELTO) 20 MG TABS Take 20 mg by mouth every evening.    Yes [provider]  spironolactone (ALDACTONE) 50 MG tablet Take 50 mg by mouth 3 (three) times daily.     Yes [provider]    Allergies as of 11/02/2016 - Review Complete 11/01/2016  Allergen Reaction Noted  . Hyoscyamine sulfate Other (See Comments)   . Reglan [metoclopramide] Other (See Comments) 11/05/2014    Family History  Problem Relation Age of Onset  . Coronary artery disease Father        Died age 48  . Heart attack Father   . Arrhythmia Father        AF  . Diabetes Father   . Parkinson's disease Father   . Arrhythmia Mother        AF  . Stroke Mother   . Dementia Mother   . Cancer Mother        UTERINE   . Parkinson's disease Mother   . Arrhythmia Brother        AF  . Colon cancer Maternal Grandfather   . Colon cancer Paternal Aunt   . Colon cancer Paternal Aunt   . Diabetes Son   . Cancer Sister        BREAST   . Depression Sister   . Depression Sister   . Depression Sister     Social History   Socioeconomic History  . Marital status: Divorced    Spouse name: Not on file  . Number of children: Not on file  . Years of education: Not on file  . Highest education level: Not on file  Social Needs  . Financial resource strain: Not on file  . Food insecurity - worry: Not on file  . Food insecurity - inability: Not on file  . Transportation needs - medical: Not on file  . Transportation needs - non-medical: Not on file  Occupational  History  . Not on file  Tobacco Use  .  Smoking status: Former Smoker    Packs/day: 1.50    Years: 30.00    Pack years: 45.00    Types: Cigarettes    Start date: 03/06/1977    Last attempt to quit: 01/03/2007    Years since quitting: 9.9  . Smokeless tobacco: Never Used  Substance and Sexual Activity  . Alcohol use: No    Alcohol/week: 0.0 oz  . Drug use: No  . Sexual activity: No    Birth control/protection: Surgical  Other Topics Concern  . Not on file  Social History Narrative  . Not on file    Review of Systems: See HPI, otherwise negative ROS  Physical Exam: BP (!) 115/56   Pulse 75   Temp 97.7 F (36.5 C)   Resp 18   Ht '5\' 8"'  (1.727 m)   Wt (!) 315 lb (142.9 kg)   SpO2 97%   BMI 47.90 kg/m  General:   Alert,   pleasant and cooperative in NAD Lungs:  Clear throughout to auscultation.   No wheezes, crackles, or rhonchi. No acute distress. Heart:  Regular rate and rhythm; no murmurs, clicks, rubs,  or gallops. Abdomen: Non-distended, normal bowel sounds.  Soft and nontender without appreciable mass or hepatosplenomegaly.  Pulses:  Normal pulses noted. Extremities:  Without clubbing or edema.  Impression:  Pleasant 56 year old lady with multiple comorbidities and a history of multiple colonic adenomas removed previously. She is due for surveillance colonoscopy at this time.  Recommendations:  I have offered the patient a surveillance colonoscopy today. Xarelto held x 2 days per plan.  The risks, benefits, limitations, alternatives and imponderables have been reviewed with the patient. Questions have been answered. All parties are agreeable.  Further recommendations to follow.        Notice: This dictation was prepared with Dragon dictation along with smaller phrase technology. Any transcriptional errors that result from this process are unintentional and may not be corrected upon review.

## 2016-12-04 NOTE — Addendum Note (Signed)
Addendum  created 12/04/16 1428 by Vista Deck, CRNA   Intraprocedure Meds edited

## 2016-12-04 NOTE — Anesthesia Preprocedure Evaluation (Addendum)
Anesthesia Evaluation  Patient identified by MRN, date of birth, ID band Patient awake    Airway Mallampati: I  TM Distance: >3 FB Neck ROM: Full    Dental  (+) Teeth Intact   Pulmonary sleep apnea (Bipap) , COPD,  COPD inhaler, former smoker,    Pulmonary exam normal        Cardiovascular Exercise Tolerance: Poor hypertension, Pt. on medications Normal cardiovascular exam Rhythm:Regular Rate:Normal  - Left ventricle: The cavity size was normal. Systolic function was   normal. The estimated ejection fraction was in the range of 60%   to 65%. Wall motion was normal; there were no regional wall   motion abnormalities. (September 2017)   Neuro/Psych  Neuromuscular disease    GI/Hepatic GERD  Medicated and Controlled,  Endo/Other  diabetes, Well Controlled, Type 2  Renal/GU      Musculoskeletal  (+) Arthritis , Osteoarthritis,    Abdominal (+) + obese,   Peds  Hematology   Anesthesia Other Findings   Reproductive/Obstetrics                            Anesthesia Physical Anesthesia Plan  ASA: III  Anesthesia Plan: MAC   Post-op Pain Management:    Induction:   PONV Risk Score and Plan:   Airway Management Planned: Nasal Cannula  Additional Equipment:   Intra-op Plan:   Post-operative Plan:   Informed Consent: I have reviewed the patients History and Physical, chart, labs and discussed the procedure including the risks, benefits and alternatives for the proposed anesthesia with the patient or authorized representative who has indicated his/her understanding and acceptance.   Dental advisory given  Plan Discussed with: CRNA  Anesthesia Plan Comments:         Anesthesia Quick Evaluation

## 2016-12-04 NOTE — Discharge Instructions (Signed)
5 Colonoscopy Discharge Instructions  Read the instructions outlined below and refer to this sheet in the next few weeks. These discharge instructions provide you with general information on caring for yourself after you leave the hospital. Your doctor may also give you specific instructions. While your treatment has been planned according to the most current medical practices available, unavoidable complications occasionally occur. If you have any problems or questions after discharge, call Dr. Gala Romney at [phone]. ACTIVITY  You may resume your regular activity, but move at a slower pace for the next 24 hours.   Take frequent rest periods for the next 24 hours.   Walking will help get rid of the air and reduce the bloated feeling in your belly (abdomen).   No driving for 24 hours (because of the medicine (anesthesia) used during the test).    Do not sign any important legal documents or operate any machinery for 24 hours (because of the anesthesia used during the test).  NUTRITION  Drink plenty of fluids.   You may resume your normal diet as instructed by your doctor.   Begin with a light meal and progress to your normal diet. Heavy or fried foods are harder to digest and may make you feel sick to your stomach (nauseated).   Avoid alcoholic beverages for 24 hours or as instructed.  MEDICATIONS  You may resume your normal medications unless your doctor tells you otherwise.  WHAT YOU CAN EXPECT TODAY  Some feelings of bloating in the abdomen.   Passage of more gas than usual.   Spotting of blood in your stool or on the toilet paper.  IF YOU HAD POLYPS REMOVED DURING THE COLONOSCOPY:  No aspirin products for 7 days or as instructed.   No alcohol for 7 days or as instructed.   Eat a soft diet for the next 24 hours.  FINDING OUT THE RESULTS OF YOUR TEST Not all test results are available during your visit. If your test results are not back during the visit, make an appointment  with your caregiver to find out the results. Do not assume everything is normal if you have not heard from your caregiver or the medical facility. It is important for you to follow up on all of your test results.  SEEK IMMEDIATE MEDICAL ATTENTION IF:  You have more than a spotting of blood in your stool.   Your belly is swollen (abdominal distention).   You are nauseated or vomiting.   You have a temperature over 101.   You have abdominal pain or discomfort that is severe or gets worse throughout the day.    Colonic diverticulosis information provided  Repeat colonoscopy in 5 years  Office visit in 6 months  Resume Xarelto today.     Diverticulosis Diverticulosis is a condition that develops when small pouches (diverticula) form in the wall of the large intestine (colon). The colon is where water is absorbed and stool is formed. The pouches form when the inside layer of the colon pushes through weak spots in the outer layers of the colon. You may have a few pouches or many of them. What are the causes? The cause of this condition is not known. What increases the risk? The following factors may make you more likely to develop this condition:  Being older than age 33. Your risk for this condition increases with age. Diverticulosis is rare among people younger than age 59. By age 63, many people have it.  Eating a low-fiber diet.  Having frequent constipation.  Being overweight.  Not getting enough exercise.  Smoking.  Taking over-the-counter pain medicines, like aspirin and ibuprofen.  Having a family history of diverticulosis.  What are the signs or symptoms? In most people, there are no symptoms of this condition. If you do have symptoms, they may include:  Bloating.  Cramps in the abdomen.  Constipation or diarrhea.  Pain in the lower left side of the abdomen.  How is this diagnosed? This condition is most often diagnosed during an exam for other colon  problems. Because diverticulosis usually has no symptoms, it often cannot be diagnosed independently. This condition may be diagnosed by:  Using a flexible scope to examine the colon (colonoscopy).  Taking an X-ray of the colon after dye has been put into the colon (barium enema).  Doing a CT scan.  How is this treated? You may not need treatment for this condition if you have never developed an infection related to diverticulosis. If you have had an infection before, treatment may include:  Eating a high-fiber diet. This may include eating more fruits, vegetables, and grains.  Taking a fiber supplement.  Taking a live bacteria supplement (probiotic).  Taking medicine to relax your colon.  Taking antibiotic medicines.  Follow these instructions at home:  Drink 6-8 glasses of water or more each day to prevent constipation.  Try not to strain when you have a bowel movement.  If you have had an infection before: ? Eat more fiber as directed by your health care provider or your diet and nutrition specialist (dietitian). ? Take a fiber supplement or probiotic, if your health care provider approves.  Take over-the-counter and prescription medicines only as told by your health care provider.  If you were prescribed an antibiotic, take it as told by your health care provider. Do not stop taking the antibiotic even if you start to feel better.  Keep all follow-up visits as told by your health care provider. This is important. Contact a health care provider if:  You have pain in your abdomen.  You have bloating.  You have cramps.  You have not had a bowel movement in 3 days. Get help right away if:  Your pain gets worse.  Your bloating becomes very bad.  You have a fever or chills, and your symptoms suddenly get worse.  You vomit.  You have bowel movements that are bloody or black.  You have bleeding from your rectum. Summary  Diverticulosis is a condition that  develops when small pouches (diverticula) form in the wall of the large intestine (colon).  You may have a few pouches or many of them.  This condition is most often diagnosed during an exam for other colon problems.  If you have had an infection related to diverticulosis, treatment may include increasing the fiber in your diet, taking supplements, or taking medicines. This information is not intended to replace advice given to you by your health care provider. Make sure you discuss any questions you have with your health care provider. Document Released: 09/16/2003 Document Revised: 11/08/2015 Document Reviewed: 11/08/2015 Elsevier Interactive Patient Education  2017 [employer].

## 2016-12-04 NOTE — OR Nursing (Signed)
Up to bathroom to void 

## 2016-12-04 NOTE — Transfer of Care (Signed)
Immediate Anesthesia Transfer of Care Note  Patient: [name] [name]  Procedure(s) Performed: COLONOSCOPY WITH PROPOFOL (N/A )  Patient Location: PACU  Anesthesia Type:MAC  Level of Consciousness: awake, alert  and oriented  Airway & Oxygen Therapy: Patient Spontanous Breathing  Post-op Assessment: Report given to RN  Post vital signs: Reviewed and stable  Last Vitals:  Vitals:   12/04/16 1200  BP: (!) 115/56  Pulse: 75  Resp: 18  Temp: 36.5 C  SpO2: 97%    Last Pain:  Vitals:   12/04/16 1335  PainSc: 5       Patients Stated Pain Goal: 5 (85/88/50 2774)  Complications: No apparent anesthesia complications

## 2016-12-04 NOTE — Op Note (Signed)
[facility] Patient Name: [name] [name] Procedure Date: 12/04/2016 9:31 AM MRN: [id number] Date of Birth: Nov 06, 1960 Attending MD: Norvel Richards , MD CSN: [id number] Age: 56 Admit Type: Outpatient Procedure:                Colonoscopy Indications:              High risk colon cancer surveillance: Personal                            history of colonic polyps Providers:                Norvel Richards, MD, Otis Peak B. Sharon Seller, RN,                            Nelma Rothman, Technician, Randa Spike, Technician Referring MD:              Medicines:                Propofol per Anesthesia Complications:            No immediate complications. Estimated Blood Loss:     Estimated blood loss: none. Procedure:                Pre-Anesthesia Assessment:                           - Prior to the procedure, a History and Physical                            was performed, and patient medications and                            allergies were reviewed. The patient's tolerance of                            previous anesthesia was also reviewed. The risks                            and benefits of the procedure and the sedation                            options and risks were discussed with the patient.                            All questions were answered, and informed consent                            was obtained. Prior Anticoagulants: The patient has                            taken no previous anticoagulant or antiplatelet                            agents. ASA Grade Assessment: III - A patient with  severe systemic disease. After reviewing the risks                            and benefits, the patient was deemed in                            satisfactory condition to undergo the procedure.                           After obtaining informed consent, the colonoscope                            was passed under direct vision. Throughout the                             procedure, the patient's blood pressure, pulse, and                            oxygen saturations were monitored continuously. The                            EC-[id number] ([id number]) scope was introduced through                            the and advanced to the the cecum, identified by                            appendiceal orifice and ileocecal valve. The                            colonoscopy was performed without difficulty. The                            patient tolerated the procedure well. Anatomical                            landmarks were photographed. The quality of the                            bowel preparation was adequate. Scope In: 1:43:07 PM Scope Out: 2:02:16 PM Scope Withdrawal Time: 0 hours 8 minutes 5 seconds  Total Procedure Duration: 0 hours 19 minutes 9 seconds  Findings:      The colon was moderately redundant.      Scattered small and large-mouthed diverticula were found in the sigmoid       colon. 1 cm pale yellow submucosal nodule in the ascending segment       (positive pillow sign ) consistent with a lipoma. the remainder of the       colonic mucosa appeared normal. Impression:               Redundant colon. Colonic diverticulosis.                           - Redundant colon.                           -  Diverticulosis in the sigmoid colon.                           - No specimens collected. Moderate Sedation:      Moderate (conscious) sedation was personally administered by an       anesthesia professional. The following parameters were monitored: oxygen       saturation, heart rate, blood pressure, respiratory rate, EKG, adequacy       of pulmonary ventilation, and response to care. Total physician       intraservice time was 30 minutes. Recommendation:           - Patient has a contact number available for                            emergencies. The signs and symptoms of potential                            delayed complications were discussed with the                             patient. Return to normal activities tomorrow.                            Written discharge instructions were provided to the                            patient.                           - Resume previous diet.                           - Continue present medications.                           - Await pathology results.                           - Repeat colonoscopy in 5 years for surveillance.                           - Return to GI clinic in 6 months. Procedure Code(s):        --- Professional ---                           [phone], Colonoscopy, flexible; diagnostic, including                            collection of specimen(s) by brushing or washing,                            when performed (separate procedure) Diagnosis Code(s):        --- Professional ---                           Z86.010, Personal history of colonic polyps  K57.30, Diverticulosis of large intestine without                            perforation or abscess without bleeding                           Q43.8, Other specified congenital malformations of                            intestine CPT copyright 2016 American Medical Association. All rights reserved. The codes documented in this report are preliminary and upon coder review may  be revised to meet current compliance requirements. Cristopher Estimable. Marlyne Totaro, MD Norvel Richards, MD 12/04/2016 2:18:00 PM This report has been signed electronically. Number of Addenda: 0

## 2016-12-04 NOTE — Anesthesia Postprocedure Evaluation (Signed)
Anesthesia Post Note  Patient: [name] [name]  Procedure(s) Performed: COLONOSCOPY WITH PROPOFOL (N/A )  Patient location during evaluation: PACU Anesthesia Type: MAC Level of consciousness: awake and alert and oriented Pain management: pain level controlled Vital Signs Assessment: post-procedure vital signs reviewed and stable Respiratory status: spontaneous breathing Cardiovascular status: blood pressure returned to baseline Postop Assessment: no apparent nausea or vomiting Anesthetic complications: no     Last Vitals:  Vitals:   12/04/16 1200  BP: (!) 115/56  Pulse: 75  Resp: 18  Temp: 36.5 C  SpO2: 97%    Last Pain:  Vitals:   12/04/16 1335  PainSc: 5                  Nazifa Trinka

## 2016-12-05 DIAGNOSIS — I10 Essential (primary) hypertension: Secondary | ICD-10-CM | POA: Diagnosis not present

## 2016-12-05 DIAGNOSIS — I2510 Atherosclerotic heart disease of native coronary artery without angina pectoris: Secondary | ICD-10-CM | POA: Diagnosis not present

## 2016-12-05 DIAGNOSIS — E119 Type 2 diabetes mellitus without complications: Secondary | ICD-10-CM | POA: Diagnosis not present

## 2016-12-08 ENCOUNTER — Encounter (HOSPITAL_COMMUNITY): Payer: Self-pay | Admitting: Internal Medicine

## 2016-12-14 DIAGNOSIS — Z79891 Long term (current) use of opiate analgesic: Secondary | ICD-10-CM | POA: Diagnosis not present

## 2016-12-14 DIAGNOSIS — F4321 Adjustment disorder with depressed mood: Secondary | ICD-10-CM | POA: Diagnosis not present

## 2016-12-14 DIAGNOSIS — E1142 Type 2 diabetes mellitus with diabetic polyneuropathy: Secondary | ICD-10-CM | POA: Diagnosis not present

## 2016-12-14 DIAGNOSIS — G894 Chronic pain syndrome: Secondary | ICD-10-CM | POA: Diagnosis not present

## 2016-12-18 ENCOUNTER — Other Ambulatory Visit: Payer: Self-pay | Admitting: Cardiology

## 2017-01-04 ENCOUNTER — Ambulatory Visit
Admission: RE | Admit: 2017-01-04 | Discharge: 2017-01-04 | Disposition: A | Discharge status: Home / Self Care | Payer: Medicare Other | Source: Ambulatory Visit | Attending: Internal Medicine | Admitting: Internal Medicine

## 2017-01-04 DIAGNOSIS — Z1231 Encounter for screening mammogram for malignant neoplasm of breast: Secondary | ICD-10-CM | POA: Diagnosis not present

## 2017-01-05 ENCOUNTER — Telehealth: Payer: Self-pay

## 2017-01-05 NOTE — Telephone Encounter (Signed)
Started PA on [email or web address] for Nexium, waiting on approval or denial.  Pt is aware that PA has been submitted.

## 2017-01-05 NOTE — Telephone Encounter (Signed)
PA request for Nexium has been approved. Letter will be scanned in chart when received from cover my meds.

## 2017-01-09 DIAGNOSIS — L84 Corns and callosities: Secondary | ICD-10-CM | POA: Diagnosis not present

## 2017-01-09 DIAGNOSIS — E1142 Type 2 diabetes mellitus with diabetic polyneuropathy: Secondary | ICD-10-CM | POA: Diagnosis not present

## 2017-01-09 DIAGNOSIS — M79676 Pain in unspecified toe(s): Secondary | ICD-10-CM | POA: Diagnosis not present

## 2017-01-09 DIAGNOSIS — B351 Tinea unguium: Secondary | ICD-10-CM | POA: Diagnosis not present

## 2017-01-10 DIAGNOSIS — I2510 Atherosclerotic heart disease of native coronary artery without angina pectoris: Secondary | ICD-10-CM | POA: Diagnosis not present

## 2017-01-10 DIAGNOSIS — I10 Essential (primary) hypertension: Secondary | ICD-10-CM | POA: Diagnosis not present

## 2017-01-10 DIAGNOSIS — E119 Type 2 diabetes mellitus without complications: Secondary | ICD-10-CM | POA: Diagnosis not present

## 2017-01-12 DIAGNOSIS — F4321 Adjustment disorder with depressed mood: Secondary | ICD-10-CM | POA: Diagnosis not present

## 2017-01-12 DIAGNOSIS — Z79891 Long term (current) use of opiate analgesic: Secondary | ICD-10-CM | POA: Diagnosis not present

## 2017-01-12 DIAGNOSIS — E1142 Type 2 diabetes mellitus with diabetic polyneuropathy: Secondary | ICD-10-CM | POA: Diagnosis not present

## 2017-01-12 DIAGNOSIS — G894 Chronic pain syndrome: Secondary | ICD-10-CM | POA: Diagnosis not present

## 2017-01-15 ENCOUNTER — Telehealth: Payer: Self-pay

## 2017-01-15 DIAGNOSIS — K5900 Constipation, unspecified: Secondary | ICD-10-CM

## 2017-01-15 NOTE — Telephone Encounter (Signed)
Received a call from [facility] from [place]. Pt is taking Miralax twice daily and the medication is consider an otc med and wont be covered under insurance. Pt would like to know what she should take in place of medication since miralax is too expensive for pt to purchase. Please advise.

## 2017-01-16 DIAGNOSIS — E1142 Type 2 diabetes mellitus with diabetic polyneuropathy: Secondary | ICD-10-CM | POA: Diagnosis not present

## 2017-01-16 DIAGNOSIS — S60312S Abrasion of left thumb, sequela: Secondary | ICD-10-CM | POA: Diagnosis not present

## 2017-01-16 DIAGNOSIS — I10 Essential (primary) hypertension: Secondary | ICD-10-CM | POA: Diagnosis not present

## 2017-01-16 DIAGNOSIS — Z6841 Body Mass Index (BMI) 40.0 and over, adult: Secondary | ICD-10-CM | POA: Diagnosis not present

## 2017-01-16 DIAGNOSIS — Z299 Encounter for prophylactic measures, unspecified: Secondary | ICD-10-CM | POA: Diagnosis not present

## 2017-01-16 DIAGNOSIS — L089 Local infection of the skin and subcutaneous tissue, unspecified: Secondary | ICD-10-CM | POA: Diagnosis not present

## 2017-01-16 DIAGNOSIS — E1165 Type 2 diabetes mellitus with hyperglycemia: Secondary | ICD-10-CM | POA: Diagnosis not present

## 2017-01-16 DIAGNOSIS — I4891 Unspecified atrial fibrillation: Secondary | ICD-10-CM | POA: Diagnosis not present

## 2017-01-16 NOTE — Telephone Encounter (Signed)
We can trial Linzess 72 mcg daily. She can pick up samples x 3 boxes from our office to try, please call with progress report. Some people develop diarrhea on Linzess which typically resolves after about 5 days. Notify us if any problems. Depending on her response we can send in an Rx versus try another medication.

## 2017-01-16 NOTE — Telephone Encounter (Signed)
Pt is currently taking Linzess. Pt said when she was at the pharmacy, they told her Lactulose is on her formulary. Pt took Lactulose a long time ago. Pt said lactulose works ok but when you have to take Miralax twice daily, that can be expensive.  Pharmacy correction: [name] is pts pharmacy of choice.

## 2017-01-17 NOTE — Telephone Encounter (Signed)
If she wants, we can send in an Rx for Lactulose to try. Let me know.

## 2017-01-17 NOTE — Telephone Encounter (Signed)
Pt would like RX sent in to [facility].

## 2017-01-18 MED ORDER — LACTULOSE 10 GM/15ML PO SOLN: 10.0000 g | Freq: Every day | ORAL | 1 refills | Status: DC

## 2017-01-18 NOTE — Addendum Note (Signed)
Addended by: Gordy Levan, ERIC A on: 01/18/2017 02:10 PM   Modules accepted: Orders

## 2017-01-18 NOTE — Telephone Encounter (Signed)
Rx sent to pharmacy per patient request. 

## 2017-01-18 NOTE — Telephone Encounter (Signed)
Noted  

## 2017-01-19 DIAGNOSIS — M170 Bilateral primary osteoarthritis of knee: Secondary | ICD-10-CM | POA: Diagnosis not present

## 2017-02-13 DIAGNOSIS — Z79891 Long term (current) use of opiate analgesic: Secondary | ICD-10-CM | POA: Diagnosis not present

## 2017-02-13 DIAGNOSIS — E1142 Type 2 diabetes mellitus with diabetic polyneuropathy: Secondary | ICD-10-CM | POA: Diagnosis not present

## 2017-02-13 DIAGNOSIS — G894 Chronic pain syndrome: Secondary | ICD-10-CM | POA: Diagnosis not present

## 2017-02-13 DIAGNOSIS — F4321 Adjustment disorder with depressed mood: Secondary | ICD-10-CM | POA: Diagnosis not present

## 2017-02-14 ENCOUNTER — Other Ambulatory Visit: Payer: Self-pay | Admitting: Nurse Practitioner

## 2017-02-19 DIAGNOSIS — R3915 Urgency of urination: Secondary | ICD-10-CM | POA: Diagnosis not present

## 2017-02-19 DIAGNOSIS — R3914 Feeling of incomplete bladder emptying: Secondary | ICD-10-CM | POA: Diagnosis not present

## 2017-02-22 ENCOUNTER — Other Ambulatory Visit: Payer: Self-pay | Admitting: Nurse Practitioner

## 2017-02-22 DIAGNOSIS — K5900 Constipation, unspecified: Secondary | ICD-10-CM

## 2017-03-12 DIAGNOSIS — Z79891 Long term (current) use of opiate analgesic: Secondary | ICD-10-CM | POA: Diagnosis not present

## 2017-03-12 DIAGNOSIS — M48062 Spinal stenosis, lumbar region with neurogenic claudication: Secondary | ICD-10-CM | POA: Diagnosis not present

## 2017-03-12 DIAGNOSIS — G894 Chronic pain syndrome: Secondary | ICD-10-CM | POA: Diagnosis not present

## 2017-03-12 DIAGNOSIS — E1142 Type 2 diabetes mellitus with diabetic polyneuropathy: Secondary | ICD-10-CM | POA: Diagnosis not present

## 2017-03-13 DIAGNOSIS — M79676 Pain in unspecified toe(s): Secondary | ICD-10-CM | POA: Diagnosis not present

## 2017-03-13 DIAGNOSIS — E1142 Type 2 diabetes mellitus with diabetic polyneuropathy: Secondary | ICD-10-CM | POA: Diagnosis not present

## 2017-03-13 DIAGNOSIS — B351 Tinea unguium: Secondary | ICD-10-CM | POA: Diagnosis not present

## 2017-03-13 DIAGNOSIS — L84 Corns and callosities: Secondary | ICD-10-CM | POA: Diagnosis not present

## 2017-03-22 DIAGNOSIS — Z9181 History of falling: Secondary | ICD-10-CM | POA: Diagnosis not present

## 2017-03-22 DIAGNOSIS — J309 Allergic rhinitis, unspecified: Secondary | ICD-10-CM | POA: Diagnosis not present

## 2017-03-22 DIAGNOSIS — J410 Simple chronic bronchitis: Secondary | ICD-10-CM | POA: Diagnosis not present

## 2017-03-22 DIAGNOSIS — G4733 Obstructive sleep apnea (adult) (pediatric): Secondary | ICD-10-CM | POA: Diagnosis not present

## 2017-04-12 DIAGNOSIS — Z79891 Long term (current) use of opiate analgesic: Secondary | ICD-10-CM | POA: Diagnosis not present

## 2017-04-12 DIAGNOSIS — F4321 Adjustment disorder with depressed mood: Secondary | ICD-10-CM | POA: Diagnosis not present

## 2017-04-12 DIAGNOSIS — G894 Chronic pain syndrome: Secondary | ICD-10-CM | POA: Diagnosis not present

## 2017-04-12 DIAGNOSIS — E1142 Type 2 diabetes mellitus with diabetic polyneuropathy: Secondary | ICD-10-CM | POA: Diagnosis not present

## 2017-04-13 DIAGNOSIS — E119 Type 2 diabetes mellitus without complications: Secondary | ICD-10-CM | POA: Diagnosis not present

## 2017-04-13 DIAGNOSIS — I10 Essential (primary) hypertension: Secondary | ICD-10-CM | POA: Diagnosis not present

## 2017-04-13 DIAGNOSIS — I2510 Atherosclerotic heart disease of native coronary artery without angina pectoris: Secondary | ICD-10-CM | POA: Diagnosis not present

## 2017-04-16 ENCOUNTER — Other Ambulatory Visit: Payer: Self-pay | Admitting: Cardiology

## 2017-04-25 DIAGNOSIS — M1710 Unilateral primary osteoarthritis, unspecified knee: Secondary | ICD-10-CM | POA: Diagnosis not present

## 2017-04-25 DIAGNOSIS — Z6841 Body Mass Index (BMI) 40.0 and over, adult: Secondary | ICD-10-CM | POA: Diagnosis not present

## 2017-04-25 DIAGNOSIS — I4891 Unspecified atrial fibrillation: Secondary | ICD-10-CM | POA: Diagnosis not present

## 2017-04-25 DIAGNOSIS — E1142 Type 2 diabetes mellitus with diabetic polyneuropathy: Secondary | ICD-10-CM | POA: Diagnosis not present

## 2017-04-25 DIAGNOSIS — Z299 Encounter for prophylactic measures, unspecified: Secondary | ICD-10-CM | POA: Diagnosis not present

## 2017-04-25 DIAGNOSIS — E7800 Pure hypercholesterolemia, unspecified: Secondary | ICD-10-CM | POA: Diagnosis not present

## 2017-04-25 DIAGNOSIS — E1165 Type 2 diabetes mellitus with hyperglycemia: Secondary | ICD-10-CM | POA: Diagnosis not present

## 2017-04-25 DIAGNOSIS — I10 Essential (primary) hypertension: Secondary | ICD-10-CM | POA: Diagnosis not present

## 2017-04-25 DIAGNOSIS — Z87891 Personal history of nicotine dependence: Secondary | ICD-10-CM | POA: Diagnosis not present

## 2017-04-25 DIAGNOSIS — G4730 Sleep apnea, unspecified: Secondary | ICD-10-CM | POA: Diagnosis not present

## 2017-05-06 NOTE — Progress Notes (Signed)
Cardiology Office Note  Date: 05/07/2017   ID: [name] [name], DOB 04-Jun-1960, MRN [id number]  PCP: Glenda Chroman, MD  Primary Cardiologist: Rozann Lesches, MD   Chief Complaint  Patient presents with  . PAF    History of Present Illness: [name] [name] is a 57 y.o. female last seen in October 2018.  She presents for a routine visit.  Remains stable from a cardiac perspective without obvious recurrent atrial fibrillation and no progressive chest pain.  She has gained weight and states that her blood sugars have also been higher (recent hemoglobin A1c 8.2), she is working on this with Dr. Woody Seller.  Current cardiac regimen includes Cardizem CD, flecainide, and Xarelto.  She does not report any bleeding problems.  Lab work from around the time of her previous visit is outlined below.  She is due for follow-up.  Past Medical History:  Diagnosis Date  . Bladder dysfunction    Self urinary catheterization  . COPD (chronic obstructive pulmonary disease) ([place])   . DDD (degenerative disc disease)   . Depression   . Esophageal spasm    NTG and Norvasc  . Essential hypertension, benign   . Fibromyalgia   . Gastroparesis   . GERD (gastroesophageal reflux disease)   . History of cardiac catheterization    Normal coronary arteries 11/12  . History of pituitary tumor   . Irritable bowel syndrome   . Lymphedema   . Osteoarthritis   . PAF (paroxysmal atrial fibrillation) ([place])   . Sleep apnea    BIPAP  . Type 2 diabetes mellitus ([place])   . Urinary tract infection     Past Surgical History:  Procedure Laterality Date  . ABDOMINAL HYSTERECTOMY    . ANTERIOR CERVICAL DECOMP/DISCECTOMY FUSION N/A 12/02/2014   Procedure: Cervical five cervical six anterior cervical decompression with fusion interbody prosthesis plating and bone graft;  Surgeon: Newman Pies, MD;  Location: [facility];  Service: Neurosurgery;  Laterality: N/A;  C56 anterior cervical decompression with fusion interbody  prosthesis plating and bonegraft  . APPENDECTOMY    . BACK SURGERY    . CARDIAC CATHETERIZATION    . CARDIOVERSION N/A 03/26/2014   Procedure: CARDIOVERSION;  Surgeon: Herminio Commons, MD;  Location: [facility];  Service: Endoscopy;  Laterality: N/A;  . CARDIOVERSION N/A 05/04/2014   Procedure: CARDIOVERSION;  Surgeon: Satira Sark, MD;  Location: [facility] ORS;  Service: Cardiovascular;  Laterality: N/A;  . CHOLECYSTECTOMY    . COLONOSCOPY     Approximately 2003. Per medical records, internal hemorrhoids noted  . COLONOSCOPY N/A 06/25/2013   Dr. Gala Romney: Anal canal hemorrhoids-more likely the source of paper hematochezia. Redundant, capacious colon. Multiple colonic polyps-tubular adenoma  . COLONOSCOPY WITH PROPOFOL N/A 12/04/2016   Procedure: COLONOSCOPY WITH PROPOFOL;  Surgeon: Daneil Dolin, MD;  Location: [facility];  Service: Endoscopy;  Laterality: N/A;  1:00pm  . ESOPHAGOGASTRODUODENOSCOPY  10/12/2004   HBZ:JIRC plaquing on the esophageal mucosa of uncertain significance, not typical of what is seen with candida esophagitis status post KOH brushing for KOH prep and biopsy for histology. Rule out candida esophagitis/eosinophilic esophagitis. Otherwise normal esophagus. Tiny hiatal hernia. Otherwise, normal stomach, normal D1 and D2. Benign biopsy of esophagus, unknown KOH status.   . ESOPHAGOGASTRODUODENOSCOPY N/A 06/25/2013   Dr. Rourk:mild chronic gastritis  . KNEE SURGERY    . LEFT HEART CATHETERIZATION WITH CORONARY ANGIOGRAM N/A 11/15/2010   Procedure: LEFT HEART CATHETERIZATION WITH CORONARY ANGIOGRAM;  Surgeon: Laverda Page,  MD;  Location: [facility] CATH LAB;  Service: Cardiovascular;  Laterality: N/A;  . LUNG BIOPSY      Current Outpatient Medications  Medication Sig Dispense Refill  . albuterol (PROAIR HFA) 108 (90 BASE) MCG/ACT inhaler Inhale 2 puffs into the lungs every 6 (six) hours as needed for wheezing or shortness of breath.     Marland Kitchen albuterol (PROVENTIL) (2.5 MG/3ML) 0.083%  nebulizer solution Take 2.5 mg by nebulization every 6 (six) hours as needed for wheezing. For shortness of breath.    Marland Kitchen atorvastatin (LIPITOR) 10 MG tablet Take 10 mg by mouth at bedtime.  3  . baclofen (LIORESAL) 20 MG tablet Take 20 mg by mouth 3 (three) times daily.      . CONSTULOSE 10 GM/15ML solution TAKE 15 MLS BY MOUTH DAILY 473 mL 5  . diclofenac sodium (VOLTAREN) 1 % GEL Apply 2-4 g topically 4 (four) times daily as needed (for pain).     Marland Kitchen dicyclomine (BENTYL) 10 MG capsule Take 1 capsule (10 mg total) by mouth 4 (four) times daily -  before meals and at bedtime. For abdominal cramping and loose stools (Patient taking differently: Take 10 mg by mouth 4 (four) times daily as needed. For abdominal cramping and loose stools) 120 capsule 5  . diltiazem (CARDIZEM CD) 120 MG 24 hr capsule TAKE ONE CAPSULE BY MOUTH EVERY DAY (Patient taking differently: TAKE 120 MG BY MOUTH EVERY DAY) 90 capsule 3  . docusate sodium (COLACE) 100 MG capsule Take 100 mg by mouth 2 (two) times daily.    . flecainide (TAMBOCOR) 50 MG tablet TAKE TWO TABLETS BY MOUTH EVERY MORNING and TAKE TWO TABLETS BY MOUTH EVERY EVENING 120 tablet 3  . fluticasone (FLONASE) 50 MCG/ACT nasal spray Place 2 sprays into both nostrils daily.  6  . furosemide (LASIX) 80 MG tablet Take 80 mg by mouth 2 (two) times daily.      Marland Kitchen gabapentin (NEURONTIN) 600 MG tablet Take 600 mg by mouth 4 (four) times daily.    Marland Kitchen glimepiride (AMARYL) 2 MG tablet Take 4 mg by mouth 2 (two) times daily.     . isosorbide mononitrate (IMDUR) 120 MG 24 hr tablet Take 120 mg by mouth every morning.     . isosorbide mononitrate (IMDUR) 30 MG 24 hr tablet Take 30 mg by mouth every evening.     Marland Kitchen LINZESS 290 MCG CAPS capsule TAKE ONE CAPSULE BY MOUTH ONCE A DAY BEFORE BREAKFAST - REPLACES [place] (Patient taking differently: TAKE 290 MCG BY MOUTH ONCE A DAY BEFORE BREAKFAST - REPLACES [place]) 30 capsule 11  . lisinopril (PRINIVIL,ZESTRIL) 10 MG tablet Take 10  mg by mouth every evening.     . loratadine (CLARITIN) 10 MG tablet Take 10 mg by mouth daily.      . meclizine (ANTIVERT) 25 MG tablet Take 25 mg by mouth 3 (three) times daily as needed for dizziness.     . metFORMIN (GLUCOPHAGE) 500 MG tablet Take 1,000 mg by mouth 2 (two) times daily.     . Multiple Vitamin (MULTIVITAMIN WITH MINERALS) TABS tablet Take 1 tablet by mouth daily.    Marland Kitchen NEXIUM 40 MG capsule TAKE ONE CAPSULE BY MOUTH EVERY DAY BEFORE BREAKFAST (SEVEN IN THE MORNING PER pt.) 30 capsule 5  . nitroGLYCERIN (NITROLINGUAL) 0.4 MG/SPRAY spray USE 1 SPRAY UNDER THE TONGUE AS NEEDED FOR CHEST PAIN 12 g 3  . NUCYNTA ER 200 MG TB12 Take 200 mg by mouth every 12 (twelve) hours.     Marland Kitchen  nystatin (MYCOSTATIN/NYSTOP) 100000 UNIT/GM POWD Apply 1 g topically daily.     Marland Kitchen omega-3 acid ethyl esters (LOVAZA) 1 g capsule Take 1 g by mouth 4 (four) times daily.     . ondansetron (ZOFRAN) 4 MG tablet TAKE 1 TABLET BY MOUTH EVERY 8 HOURS AS NEEDED FOR NAUSEA OR VOMITING (Patient taking differently: TAKE 4 MG BY MOUTH EVERY 8 HOURS AS NEEDED FOR NAUSEA OR VOMITING) 30 tablet 3  . oxyCODONE-acetaminophen (PERCOCET) 10-325 MG per tablet Take 1 tablet by mouth every 6 (six) hours as needed for pain.     . polyethylene glycol powder (GLYCOLAX/MIRALAX) powder Take 34 g by mouth daily.     . potassium chloride SA (K-DUR,KLOR-CON) 20 MEQ tablet Take 40 mEq by mouth 4 (four) times daily.      . ranitidine (ZANTAC) 150 MG tablet Take 150 mg by mouth daily as needed for heartburn.     . Rivaroxaban (XARELTO) 20 MG TABS Take 20 mg by mouth every evening.     Marland Kitchen spironolactone (ALDACTONE) 50 MG tablet Take 50 mg by mouth 3 (three) times daily.      Marland Kitchen buPROPion (WELLBUTRIN) 75 MG tablet Take 1 tablet (75 mg total) by mouth daily. 30 tablet 3   No current facility-administered medications for this visit.    Allergies:  Tape; Hyoscyamine sulfate; and Reglan [metoclopramide]   Social History: The patient  reports that  she quit smoking about 10 years ago. Her smoking use included cigarettes. She started smoking about 40 years ago. She has a 45.00 pack-year smoking history. She has never used smokeless tobacco. She reports that she does not drink alcohol or use drugs.   ROS:  Please see the history of present illness. Otherwise, complete review of systems is positive for chronic back pain.  All other systems are reviewed and negative.   Physical Exam: VS:  BP (!) 118/58   Pulse 67   Ht 5' 8.5" (1.74 m)   Wt (!) 327 lb (148.3 kg)   SpO2 97%   BMI 49.00 kg/m , BMI Body mass index is 49 kg/m.  Wt Readings from Last 3 Encounters:  05/07/17 (!) 327 lb (148.3 kg)  12/04/16 (!) 315 lb (142.9 kg)  11/01/16 (!) 315 lb 9.6 oz (143.2 kg)    General: Patient appears comfortable at rest. HEENT: Conjunctiva and lids normal, oropharynx clear. Neck: Supple, no elevated JVP or carotid bruits, no thyromegaly. Lungs: Clear to auscultation, nonlabored breathing at rest. Cardiac: Regular rate and rhythm, no S3 or significant systolic murmur. Abdomen: Soft, nontender, bowel sounds present. Extremities: No pitting edema, distal pulses 2+. Skin: Warm and dry. Musculoskeletal: No kyphosis. Neuropsychiatric: Alert and oriented x3, affect grossly appropriate.  ECG: I personally reviewed the tracing from 10/30/2017 which showed sinus rhythm with right bundle branch block.  Recent Labwork: 11/01/2016: BUN 13; Creat 0.55; Hemoglobin 11.5; Platelets 318; Potassium 4.2; Sodium 136   Other Studies Reviewed Today:  Echocardiogram 09/15/2015: Study Conclusions  - Left ventricle: The cavity size was normal. Systolic function was normal. The estimated ejection fraction was in the range of 60% to 65%. Wall motion was normal; there were no regional wall motion abnormalities. Doppler parameters are consistent with abnormal left ventricular relaxation (grade 1 diastolic dysfunction). Mild concentric and moderate focal  basal septal hypertrophy.  Assessment and Plan:  1.  Paroxysmal atrial fibrillation.  She remains symptomatically stable on current regimen including flecainide, Cardizem CD, and Xarelto.  Follow-up CBC and BMET.  2.  Morbid obesity and OSA.  She uses BiPAP at nighttime.  We talked about diet and weight loss.  3.  Essential hypertension, blood pressure is well controlled today.  4.  Uncontrolled type 2 diabetes mellitus, recent hemoglobin A1c 8.2.  She is working on this with Dr. Woody Seller.  Not on insulin as yet.  Current medicines were reviewed with the patient today.   Orders Placed This Encounter  Procedures  . CBC  . Basic Metabolic Panel (BMET)    Disposition: Follow-up in 6 months.  Signed, Satira Sark, MD, [facility] 05/07/2017 1:12 PM    [facility], [place], [place] [address] Phone: [phone]; Fax: [phone]

## 2017-05-07 ENCOUNTER — Ambulatory Visit (INDEPENDENT_AMBULATORY_CARE_PROVIDER_SITE_OTHER): Payer: Medicare Other | Admitting: Cardiology

## 2017-05-07 ENCOUNTER — Encounter: Payer: Self-pay | Admitting: Cardiology

## 2017-05-07 VITALS — BP 118/58 | HR 67 | Ht 68.5 in | Wt 327.0 lb

## 2017-05-07 DIAGNOSIS — I480 Paroxysmal atrial fibrillation: Secondary | ICD-10-CM | POA: Diagnosis not present

## 2017-05-07 DIAGNOSIS — E119 Type 2 diabetes mellitus without complications: Secondary | ICD-10-CM | POA: Diagnosis not present

## 2017-05-07 DIAGNOSIS — E1165 Type 2 diabetes mellitus with hyperglycemia: Secondary | ICD-10-CM | POA: Diagnosis not present

## 2017-05-07 DIAGNOSIS — I10 Essential (primary) hypertension: Secondary | ICD-10-CM | POA: Diagnosis not present

## 2017-05-07 DIAGNOSIS — I2510 Atherosclerotic heart disease of native coronary artery without angina pectoris: Secondary | ICD-10-CM | POA: Diagnosis not present

## 2017-05-07 NOTE — Patient Instructions (Signed)
Your physician wants you to follow-up in: [place] will receive a reminder letter in the mail two months in advance. If you don't receive a letter, please call our office to schedule the follow-up appointment.  Your physician recommends that you continue on your current medications as directed. Please refer to the Current Medication list given to you today.  Your physician recommends that you return for lab work in: CBC/BMP  Thank you for choosing [facility]!!

## 2017-05-08 ENCOUNTER — Telehealth: Payer: Self-pay

## 2017-05-08 NOTE — Telephone Encounter (Signed)
-----   Message from Satira Sark, MD sent at 05/08/2017  7:59 AM EDT ----- Results reviewed.  Please let her know that hemoglobin is normal.  Also normal renal function.  Continue with same therapy. A copy of this test should be forwarded to Glenda Chroman, MD.

## 2017-05-08 NOTE — Telephone Encounter (Signed)
Patient notified. Routed to PCP 

## 2017-05-11 DIAGNOSIS — E1142 Type 2 diabetes mellitus with diabetic polyneuropathy: Secondary | ICD-10-CM | POA: Diagnosis not present

## 2017-05-11 DIAGNOSIS — M47816 Spondylosis without myelopathy or radiculopathy, lumbar region: Secondary | ICD-10-CM | POA: Diagnosis not present

## 2017-05-11 DIAGNOSIS — Z79891 Long term (current) use of opiate analgesic: Secondary | ICD-10-CM | POA: Diagnosis not present

## 2017-05-11 DIAGNOSIS — G894 Chronic pain syndrome: Secondary | ICD-10-CM | POA: Diagnosis not present

## 2017-05-14 DIAGNOSIS — Z87891 Personal history of nicotine dependence: Secondary | ICD-10-CM | POA: Diagnosis not present

## 2017-05-14 DIAGNOSIS — E1165 Type 2 diabetes mellitus with hyperglycemia: Secondary | ICD-10-CM | POA: Diagnosis not present

## 2017-05-14 DIAGNOSIS — I10 Essential (primary) hypertension: Secondary | ICD-10-CM | POA: Diagnosis not present

## 2017-05-14 DIAGNOSIS — Z6841 Body Mass Index (BMI) 40.0 and over, adult: Secondary | ICD-10-CM | POA: Diagnosis not present

## 2017-05-14 DIAGNOSIS — R05 Cough: Secondary | ICD-10-CM | POA: Diagnosis not present

## 2017-05-14 DIAGNOSIS — Z299 Encounter for prophylactic measures, unspecified: Secondary | ICD-10-CM | POA: Diagnosis not present

## 2017-05-17 ENCOUNTER — Other Ambulatory Visit: Payer: Self-pay | Admitting: Gastroenterology

## 2017-05-22 DIAGNOSIS — M79676 Pain in unspecified toe(s): Secondary | ICD-10-CM | POA: Diagnosis not present

## 2017-05-22 DIAGNOSIS — E1142 Type 2 diabetes mellitus with diabetic polyneuropathy: Secondary | ICD-10-CM | POA: Diagnosis not present

## 2017-05-22 DIAGNOSIS — B351 Tinea unguium: Secondary | ICD-10-CM | POA: Diagnosis not present

## 2017-05-22 DIAGNOSIS — L84 Corns and callosities: Secondary | ICD-10-CM | POA: Diagnosis not present

## 2017-05-29 ENCOUNTER — Other Ambulatory Visit: Payer: Self-pay | Admitting: Gastroenterology

## 2017-05-29 DIAGNOSIS — K219 Gastro-esophageal reflux disease without esophagitis: Secondary | ICD-10-CM

## 2017-05-29 DIAGNOSIS — R110 Nausea: Secondary | ICD-10-CM

## 2017-05-29 DIAGNOSIS — K582 Mixed irritable bowel syndrome: Secondary | ICD-10-CM

## 2017-06-04 ENCOUNTER — Ambulatory Visit: Payer: Medicare Other | Admitting: Nurse Practitioner

## 2017-06-11 DIAGNOSIS — E1142 Type 2 diabetes mellitus with diabetic polyneuropathy: Secondary | ICD-10-CM | POA: Diagnosis not present

## 2017-06-11 DIAGNOSIS — M48062 Spinal stenosis, lumbar region with neurogenic claudication: Secondary | ICD-10-CM | POA: Diagnosis not present

## 2017-06-11 DIAGNOSIS — G894 Chronic pain syndrome: Secondary | ICD-10-CM | POA: Diagnosis not present

## 2017-06-11 DIAGNOSIS — Z79891 Long term (current) use of opiate analgesic: Secondary | ICD-10-CM | POA: Diagnosis not present

## 2017-06-13 DIAGNOSIS — E1165 Type 2 diabetes mellitus with hyperglycemia: Secondary | ICD-10-CM | POA: Diagnosis not present

## 2017-06-13 DIAGNOSIS — I4891 Unspecified atrial fibrillation: Secondary | ICD-10-CM | POA: Diagnosis not present

## 2017-06-13 DIAGNOSIS — Z6841 Body Mass Index (BMI) 40.0 and over, adult: Secondary | ICD-10-CM | POA: Diagnosis not present

## 2017-06-13 DIAGNOSIS — Z1339 Encounter for screening examination for other mental health and behavioral disorders: Secondary | ICD-10-CM | POA: Diagnosis not present

## 2017-06-13 DIAGNOSIS — E1142 Type 2 diabetes mellitus with diabetic polyneuropathy: Secondary | ICD-10-CM | POA: Diagnosis not present

## 2017-06-13 DIAGNOSIS — Z299 Encounter for prophylactic measures, unspecified: Secondary | ICD-10-CM | POA: Diagnosis not present

## 2017-06-13 DIAGNOSIS — Z0000 Encounter for general adult medical examination without abnormal findings: Secondary | ICD-10-CM | POA: Diagnosis not present

## 2017-06-13 DIAGNOSIS — E7800 Pure hypercholesterolemia, unspecified: Secondary | ICD-10-CM | POA: Diagnosis not present

## 2017-06-13 DIAGNOSIS — Z1211 Encounter for screening for malignant neoplasm of colon: Secondary | ICD-10-CM | POA: Diagnosis not present

## 2017-06-13 DIAGNOSIS — R5383 Other fatigue: Secondary | ICD-10-CM | POA: Diagnosis not present

## 2017-06-13 DIAGNOSIS — Z1331 Encounter for screening for depression: Secondary | ICD-10-CM | POA: Diagnosis not present

## 2017-06-13 DIAGNOSIS — I10 Essential (primary) hypertension: Secondary | ICD-10-CM | POA: Diagnosis not present

## 2017-06-13 DIAGNOSIS — Z7189 Other specified counseling: Secondary | ICD-10-CM | POA: Diagnosis not present

## 2017-06-13 DIAGNOSIS — Z79899 Other long term (current) drug therapy: Secondary | ICD-10-CM | POA: Diagnosis not present

## 2017-06-19 ENCOUNTER — Other Ambulatory Visit: Payer: Self-pay | Admitting: Cardiology

## 2017-06-19 DIAGNOSIS — E119 Type 2 diabetes mellitus without complications: Secondary | ICD-10-CM | POA: Diagnosis not present

## 2017-06-19 DIAGNOSIS — I2510 Atherosclerotic heart disease of native coronary artery without angina pectoris: Secondary | ICD-10-CM | POA: Diagnosis not present

## 2017-06-19 DIAGNOSIS — I10 Essential (primary) hypertension: Secondary | ICD-10-CM | POA: Diagnosis not present

## 2017-07-03 ENCOUNTER — Other Ambulatory Visit: Payer: Self-pay

## 2017-07-03 DIAGNOSIS — I83893 Varicose veins of bilateral lower extremities with other complications: Secondary | ICD-10-CM

## 2017-07-11 DIAGNOSIS — E1142 Type 2 diabetes mellitus with diabetic polyneuropathy: Secondary | ICD-10-CM | POA: Diagnosis not present

## 2017-07-11 DIAGNOSIS — Z79891 Long term (current) use of opiate analgesic: Secondary | ICD-10-CM | POA: Diagnosis not present

## 2017-07-11 DIAGNOSIS — F4321 Adjustment disorder with depressed mood: Secondary | ICD-10-CM | POA: Diagnosis not present

## 2017-07-11 DIAGNOSIS — G894 Chronic pain syndrome: Secondary | ICD-10-CM | POA: Diagnosis not present

## 2017-08-08 DIAGNOSIS — Z79891 Long term (current) use of opiate analgesic: Secondary | ICD-10-CM | POA: Diagnosis not present

## 2017-08-08 DIAGNOSIS — G894 Chronic pain syndrome: Secondary | ICD-10-CM | POA: Diagnosis not present

## 2017-08-08 DIAGNOSIS — E1142 Type 2 diabetes mellitus with diabetic polyneuropathy: Secondary | ICD-10-CM | POA: Diagnosis not present

## 2017-08-08 DIAGNOSIS — M48062 Spinal stenosis, lumbar region with neurogenic claudication: Secondary | ICD-10-CM | POA: Diagnosis not present

## 2017-08-09 DIAGNOSIS — E11319 Type 2 diabetes mellitus with unspecified diabetic retinopathy without macular edema: Secondary | ICD-10-CM | POA: Diagnosis not present

## 2017-08-15 ENCOUNTER — Other Ambulatory Visit: Payer: Self-pay | Admitting: Gastroenterology

## 2017-08-15 ENCOUNTER — Other Ambulatory Visit: Payer: Self-pay | Admitting: Cardiology

## 2017-08-20 DIAGNOSIS — E1142 Type 2 diabetes mellitus with diabetic polyneuropathy: Secondary | ICD-10-CM | POA: Diagnosis not present

## 2017-08-20 DIAGNOSIS — G4730 Sleep apnea, unspecified: Secondary | ICD-10-CM | POA: Diagnosis not present

## 2017-08-20 DIAGNOSIS — Z299 Encounter for prophylactic measures, unspecified: Secondary | ICD-10-CM | POA: Diagnosis not present

## 2017-08-20 DIAGNOSIS — E1165 Type 2 diabetes mellitus with hyperglycemia: Secondary | ICD-10-CM | POA: Diagnosis not present

## 2017-08-20 DIAGNOSIS — I10 Essential (primary) hypertension: Secondary | ICD-10-CM | POA: Diagnosis not present

## 2017-08-20 DIAGNOSIS — I4891 Unspecified atrial fibrillation: Secondary | ICD-10-CM | POA: Diagnosis not present

## 2017-08-20 DIAGNOSIS — Z6841 Body Mass Index (BMI) 40.0 and over, adult: Secondary | ICD-10-CM | POA: Diagnosis not present

## 2017-08-28 ENCOUNTER — Ambulatory Visit (INDEPENDENT_AMBULATORY_CARE_PROVIDER_SITE_OTHER): Payer: Medicare Other | Admitting: Nurse Practitioner

## 2017-08-28 ENCOUNTER — Encounter: Payer: Self-pay | Admitting: Nurse Practitioner

## 2017-08-28 VITALS — BP 128/71 | HR 71 | Temp 97.5°F | Ht 69.0 in | Wt 322.6 lb

## 2017-08-28 DIAGNOSIS — E1143 Type 2 diabetes mellitus with diabetic autonomic (poly)neuropathy: Secondary | ICD-10-CM

## 2017-08-28 DIAGNOSIS — K5900 Constipation, unspecified: Secondary | ICD-10-CM

## 2017-08-28 DIAGNOSIS — K3184 Gastroparesis: Secondary | ICD-10-CM

## 2017-08-28 DIAGNOSIS — K219 Gastro-esophageal reflux disease without esophagitis: Secondary | ICD-10-CM

## 2017-08-28 NOTE — Assessment & Plan Note (Signed)
History of gastroparesis in the setting of diabetes.  She has persistent nausea and generally elects not to take anything.  When her nausea becomes worse she does take Zofran and this helps her symptoms.  No vomiting.  She is satisfied with her current regimen.  Recommend she continue this and follow-up in a year.

## 2017-08-28 NOTE — Patient Instructions (Signed)
1. Continue your current medications. 2. Return for follow-up in 1 year. 3. Call us if you have any questions or concerns.  At [facility] we value your feedback. You may receive a survey about your visit today. Please share your experience as we strive to create trusting relationships with our patients to provide genuine, compassionate, quality care.  We appreciate your understanding and patience as we review any laboratory studies, imaging, and other diagnostic tests that are ordered as we care for you. Our office policy is 5 business days for review of these results, and any emergent or urgent results are addressed in a timely manner for your best interest. If you do not hear from our office in 1 week, please contact us.   We also encourage the use of MyChart, which contains your medical information for your review as well. If you are not enrolled in this feature, an access code is available on this after visit summary for your convenience. Thank you for allowing [place] to be involved in the care of you and your family.   It was great to see you today!  I hope you have a great summer!!

## 2017-08-28 NOTE — Progress Notes (Signed)
CC'D TO PCP °

## 2017-08-28 NOTE — Assessment & Plan Note (Signed)
The patient historically has quite difficult to manage constipation likely related to opioid induced constipation.  She is on a regimen now that she feels is working pretty well for her and she is not wanting to make major changes at this point.  She is currently taking Linzess 290 mcg daily, MiraLAX once a day, stool softener 1-2 times a day, lactulose as needed.  Abdominal pain intermittently if she is particularly constipated on a given day.  Denies hematochezia.  Colonoscopy up-to-date.  No other red flag/warning signs or symptoms.  This point recommend she continue her bowel regimen and other medications.  Follow-up in 1 year.  Call if any worsening symptoms before then.

## 2017-08-28 NOTE — Progress Notes (Signed)
Referring Provider: Glenda Chroman, MD Primary Care Physician:  Glenda Chroman, MD Primary GI:  Dr. Gala Romney  Chief Complaint  Patient presents with  . Gastroesophageal Reflux    Nexium helping  . Constipation    HPI:   [name] [name] is a 57 y.o. female who presents for six-month post procedure follow-up.  Patient was last seen in our office 11/01/2016 for history of adenomatous colon polyp and polypharmacy.  Her last visit was to reschedule her colonoscopy.  History of colon polyps, drug-induced constipation, gastroparesis, GERD.  Previous colonoscopy 06/25/2013 which found adequate prep, hemorrhoids, single 7 mm polyp in the mid sigmoid second, single 8 mm polyp in the mid descending segment otherwise normal.  Surgical pathology found tubular adenoma and recommended 3-year repeat exam.  Deviously scheduled colonoscopy was canceled.  Her last visit she was brought in to reschedule.  Intermittent nausea from gastroparesis but no vomiting.  Remains on Xarelto for A. fib and cardiology recently saw her and indicated it was okay to hold this for her procedure.  No other GI concerns.  Recommended colonoscopy, continue current medications, extended prep due to prior poor prep, follow-up based on post procedure recommendations.  Colonoscopy completed 12/04/2016 which found adequate preparation, moderately redundant colon, diverticulosis in the sigmoid colon, no specimens collected.  Recommended six-month clinic follow-up, repeat colonoscopy in 5 years for surveillance (December 2023).  She called our office 01/15/2017 requesting prescription constipation management.  She was currently on Linzess and MiraLAX was getting expensive taking twice a day.  A prescription for lactulose sent to the pharmacy, per her request.  Requested follow-up for any other concerns.  No further follow-up noted.  Today she states she's doing ok overall. Nexium is controlling symptoms well overall. Has flares depending on what  she eats at which point she'll take Zantac which helps with breakthrough. Constipation is still ongoing. Still on Linzess, stool softener, miraLAX and lactulose. Tries to have a bowel movement every day, typically will has soft/diarrhea (which she prefers to constipation.) Still with occasional straining. Denies any overt abdominal pain. Still with persistent nausea in the setting of gastroparesis, no vomiting. She takes Zofran as needed, which she states helps. Denies hematochezia, melena, fever, chills, unintentional weight loss. Denies chest pain, dyspnea, dizziness, lightheadedness, syncope, near syncope. Denies any other upper or lower GI symptoms.  Past Medical History:  Diagnosis Date  . Bladder dysfunction    Self urinary catheterization  . COPD (chronic obstructive pulmonary disease) ([place])   . DDD (degenerative disc disease)   . Depression   . Esophageal spasm    NTG and Norvasc  . Essential hypertension, benign   . Fibromyalgia   . Gastroparesis   . GERD (gastroesophageal reflux disease)   . History of cardiac catheterization    Normal coronary arteries 11/12  . History of pituitary tumor   . Irritable bowel syndrome   . Lymphedema   . Osteoarthritis   . PAF (paroxysmal atrial fibrillation) ([place])   . Sleep apnea    BIPAP  . Type 2 diabetes mellitus ([place])   . Urinary tract infection     Past Surgical History:  Procedure Laterality Date  . ABDOMINAL HYSTERECTOMY    . ANTERIOR CERVICAL DECOMP/DISCECTOMY FUSION N/A 12/02/2014   Procedure: Cervical five cervical six anterior cervical decompression with fusion interbody prosthesis plating and bone graft;  Surgeon: Newman Pies, MD;  Location: [facility];  Service: Neurosurgery;  Laterality: N/A;  C56 anterior cervical decompression with  fusion interbody prosthesis plating and bonegraft  . APPENDECTOMY    . BACK SURGERY    . CARDIAC CATHETERIZATION    . CARDIOVERSION N/A 03/26/2014   Procedure: CARDIOVERSION;  Surgeon:  Herminio Commons, MD;  Location: [facility];  Service: Endoscopy;  Laterality: N/A;  . CARDIOVERSION N/A 05/04/2014   Procedure: CARDIOVERSION;  Surgeon: Satira Sark, MD;  Location: [facility] ORS;  Service: Cardiovascular;  Laterality: N/A;  . CHOLECYSTECTOMY    . COLONOSCOPY     Approximately 2003. Per medical records, internal hemorrhoids noted  . COLONOSCOPY N/A 06/25/2013   Dr. Gala Romney: Anal canal hemorrhoids-more likely the source of paper hematochezia. Redundant, capacious colon. Multiple colonic polyps-tubular adenoma  . COLONOSCOPY WITH PROPOFOL N/A 12/04/2016   Procedure: COLONOSCOPY WITH PROPOFOL;  Surgeon: Daneil Dolin, MD;  Location: [facility];  Service: Endoscopy;  Laterality: N/A;  1:00pm  . ESOPHAGOGASTRODUODENOSCOPY  10/12/2004   KKX:FGHW plaquing on the esophageal mucosa of uncertain significance, not typical of what is seen with candida esophagitis status post KOH brushing for KOH prep and biopsy for histology. Rule out candida esophagitis/eosinophilic esophagitis. Otherwise normal esophagus. Tiny hiatal hernia. Otherwise, normal stomach, normal D1 and D2. Benign biopsy of esophagus, unknown KOH status.   . ESOPHAGOGASTRODUODENOSCOPY N/A 06/25/2013   Dr. Rourk:mild chronic gastritis  . KNEE SURGERY    . LEFT HEART CATHETERIZATION WITH CORONARY ANGIOGRAM N/A 11/15/2010   Procedure: LEFT HEART CATHETERIZATION WITH CORONARY ANGIOGRAM;  Surgeon: Laverda Page, MD;  Location[facility] CATH LAB;  Service: Cardiovascular;  Laterality: N/A;  . LUNG BIOPSY      Current Outpatient Medications  Medication Sig Dispense Refill  . albuterol (PROAIR HFA) 108 (90 BASE) MCG/ACT inhaler Inhale 2 puffs into the lungs every 6 (six) hours as needed for wheezing or shortness of breath.     Marland Kitchen albuterol (PROVENTIL) (2.5 MG/3ML) 0.083% nebulizer solution Take 2.5 mg by nebulization every 6 (six) hours as needed for wheezing. For shortness of breath.    Marland Kitchen atorvastatin (LIPITOR) 10 MG tablet Take 10 mg by  mouth at bedtime.  3  . baclofen (LIORESAL) 20 MG tablet Take 20 mg by mouth 3 (three) times daily.      Marland Kitchen buPROPion (WELLBUTRIN) 75 MG tablet Take 1 tablet (75 mg total) by mouth daily. 30 tablet 3  . CONSTULOSE 10 GM/15ML solution TAKE 15 MLS BY MOUTH DAILY 473 mL 5  . diclofenac sodium (VOLTAREN) 1 % GEL Apply 2-4 g topically 4 (four) times daily as needed (for pain).     Marland Kitchen dicyclomine (BENTYL) 10 MG capsule Take 1 capsule (10 mg total) by mouth 4 (four) times daily -  before meals and at bedtime. For abdominal cramping and loose stools (Patient taking differently: Take 10 mg by mouth 4 (four) times daily as needed. For abdominal cramping and loose stools) 120 capsule 5  . diltiazem (CARDIZEM CD) 120 MG 24 hr capsule TAKE ONE CAPSULE BY MOUTH EVERY DAY 90 capsule 3  . docusate sodium (COLACE) 100 MG capsule Take 100 mg by mouth 2 (two) times daily.    . flecainide (TAMBOCOR) 50 MG tablet TAKE TWO TABLETS BY MOUTH EVERY MORNING and TAKE TWO TABLETS BY MOUTH EVERY EVENING 120 tablet 3  . fluticasone (FLONASE) 50 MCG/ACT nasal spray Place 2 sprays into both nostrils daily.  6  . furosemide (LASIX) 80 MG tablet Take 80 mg by mouth 2 (two) times daily.      Marland Kitchen gabapentin (NEURONTIN) 600 MG tablet Take  600 mg by mouth 4 (four) times daily.    Marland Kitchen glimepiride (AMARYL) 2 MG tablet Take 4 mg by mouth 2 (two) times daily.     . Insulin Degludec (TRESIBA ) Inject 14 Units into the skin at bedtime.    . isosorbide mononitrate (IMDUR) 120 MG 24 hr tablet Take 120 mg by mouth every morning.     . isosorbide mononitrate (IMDUR) 30 MG 24 hr tablet Take 30 mg by mouth every evening.     Marland Kitchen LINZESS 290 MCG CAPS capsule TAKE ONE CAPSULE BY MOUTH EVERY DAY BEFORE BREAKFAST - REPLACES MOVANTIK 30 capsule 11  . lisinopril (PRINIVIL,ZESTRIL) 10 MG tablet Take 10 mg by mouth every evening.     . loratadine (CLARITIN) 10 MG tablet Take 10 mg by mouth daily.      . meclizine (ANTIVERT) 25 MG tablet Take 25 mg by mouth 3  (three) times daily as needed for dizziness.     . metFORMIN (GLUCOPHAGE) 500 MG tablet Take 1,000 mg by mouth 2 (two) times daily.     . Multiple Vitamin (MULTIVITAMIN WITH MINERALS) TABS tablet Take 1 tablet by mouth daily.    Marland Kitchen NEXIUM 40 MG capsule TAKE ONE CAPSULE BY MOUTH EVERY DAY BEFORE BREAKFAST (seven IN THE MORNING PER pt) 30 capsule 5  . nitroGLYCERIN (NITROLINGUAL) 0.4 MG/SPRAY spray USE 1 SPRAY UNDER THE TONGUE AS NEEDED FOR CHEST PAIN 12 g 3  . NUCYNTA ER 200 MG TB12 Take 200 mg by mouth every 12 (twelve) hours.     Marland Kitchen nystatin (MYCOSTATIN/NYSTOP) 100000 UNIT/GM POWD Apply 1 g topically daily.     Marland Kitchen omega-3 acid ethyl esters (LOVAZA) 1 g capsule Take 1 g by mouth 4 (four) times daily.     . ondansetron (ZOFRAN) 4 MG tablet TAKE 4 MG BY MOUTH EVERY 8 HOURS AS NEEDED FOR NAUSEA OR VOMITING 30 tablet 3  . oxyCODONE-acetaminophen (PERCOCET) 10-325 MG per tablet Take 1 tablet by mouth every 6 (six) hours as needed for pain.     . polyethylene glycol powder (GLYCOLAX/MIRALAX) powder Take 17 g by mouth daily. Occ 34g if needed    . potassium chloride SA (K-DUR,KLOR-CON) 20 MEQ tablet Take 40 mEq by mouth 4 (four) times daily.      . ranitidine (ZANTAC) 150 MG tablet Take 150 mg by mouth daily as needed for heartburn.     . Rivaroxaban (XARELTO) 20 MG TABS Take 20 mg by mouth every evening.     Marland Kitchen spironolactone (ALDACTONE) 50 MG tablet Take 50 mg by mouth 3 (three) times daily.       No current facility-administered medications for this visit.     Allergies as of 08/28/2017 - Review Complete 08/28/2017  Allergen Reaction Noted  . Tape Other (See Comments) 11/21/2016  . Hyoscyamine sulfate Other (See Comments)   . Reglan [metoclopramide] Other (See Comments) 11/05/2014    Family History  Problem Relation Age of Onset  . Coronary artery disease Father        Died age 64  . Heart attack Father   . Arrhythmia Father        AF  . Diabetes Father   . Parkinson's disease Father   .  Arrhythmia Mother        AF  . Stroke Mother   . Dementia Mother   . Cancer Mother        UTERINE   . Parkinson's disease Mother   . Arrhythmia Brother  AF  . Colon cancer Maternal Grandfather   . Colon cancer Paternal Aunt   . Colon cancer Paternal Aunt   . Diabetes Son   . Cancer Sister        BREAST   . Depression Sister   . Depression Sister   . Depression Sister     Social History   Socioeconomic History  . Marital status: Divorced    Spouse name: Not on file  . Number of children: Not on file  . Years of education: Not on file  . Highest education level: Not on file  Occupational History  . Not on file  Social Needs  . Financial resource strain: Not on file  . Food insecurity:    Worry: Not on file    Inability: Not on file  . Transportation needs:    Medical: Not on file    Non-medical: Not on file  Tobacco Use  . Smoking status: Former Smoker    Packs/day: 1.50    Years: 30.00    Pack years: 45.00    Types: Cigarettes    Start date: 03/06/1977    Last attempt to quit: 01/03/2007    Years since quitting: 10.6  . Smokeless tobacco: Never Used  Substance and Sexual Activity  . Alcohol use: No    Alcohol/week: 0.0 standard drinks  . Drug use: No  . Sexual activity: Never    Birth control/protection: Surgical  Lifestyle  . Physical activity:    Days per week: Not on file    Minutes per session: Not on file  . Stress: Not on file  Relationships  . Social connections:    Talks on phone: Not on file    Gets together: Not on file    Attends religious service: Not on file    Active member of club or organization: Not on file    Attends meetings of clubs or organizations: Not on file    Relationship status: Not on file  Other Topics Concern  . Not on file  Social History Narrative  . Not on file    Review of Systems: General: Negative for anorexia, weight loss, fever, chills, fatigue, weakness. ENT: Negative for hoarseness, difficulty  swallowing. CV: Negative for chest pain, angina, palpitations, peripheral edema.  Respiratory: Negative for dyspnea at rest, cough, sputum, wheezing.  GI: See history of present illness. MS: Admits ongoing chronic pain.  Endo: Negative for unusual weight change.  Heme: Negative for bruising or bleeding.   Physical Exam: BP 128/71   Pulse 71   Temp (!) 97.5 F (36.4 C) (Oral)   Ht 5\' 9"  (1.753 m)   Wt (!) 322 lb 9.6 oz (146.3 kg)   BMI 47.64 kg/m  General:   Alert and oriented. Pleasant and cooperative. Well-nourished and well-developed.  Eyes:  Without icterus, sclera clear and conjunctiva pink.  Ears:  Normal auditory acuity. Cardiovascular:  S1, S2 present without murmurs appreciated.  Extremities without clubbing or edema. Respiratory:  Clear to auscultation bilaterally. No wheezes, rales, or rhonchi. No distress.  Gastrointestinal:  +BS, soft, non-tender and non-distended. No HSM noted. No guarding or rebound. No masses appreciated.  Rectal:  Deferred  Musculoskalatal:  Symmetrical without gross deformities. Skin:  Intact without significant lesions or rashes. Neurologic:  Alert and oriented x4;  grossly normal neurologically. Psych:  Alert and cooperative. Normal mood and affect. Heme/Lymph/Immune: No excessive bruising noted.    08/28/2017 11:28 AM   Disclaimer: This note was dictated with voice recognition  software. Similar sounding words can inadvertently be transcribed and may not be corrected upon review.

## 2017-08-28 NOTE — Assessment & Plan Note (Signed)
GERD generally well managed on Nexium.  She does have breakthrough symptoms with dietary indiscretions which she admits is her choice.  She takes Zantac when she has breakthrough symptoms and this works well for her.  Recommend she continue her current medications and follow-up in 1 year.

## 2017-09-06 DIAGNOSIS — L84 Corns and callosities: Secondary | ICD-10-CM | POA: Diagnosis not present

## 2017-09-06 DIAGNOSIS — E1142 Type 2 diabetes mellitus with diabetic polyneuropathy: Secondary | ICD-10-CM | POA: Diagnosis not present

## 2017-09-06 DIAGNOSIS — B351 Tinea unguium: Secondary | ICD-10-CM | POA: Diagnosis not present

## 2017-09-06 DIAGNOSIS — M79676 Pain in unspecified toe(s): Secondary | ICD-10-CM | POA: Diagnosis not present

## 2017-09-11 DIAGNOSIS — M48062 Spinal stenosis, lumbar region with neurogenic claudication: Secondary | ICD-10-CM | POA: Diagnosis not present

## 2017-09-11 DIAGNOSIS — E1142 Type 2 diabetes mellitus with diabetic polyneuropathy: Secondary | ICD-10-CM | POA: Diagnosis not present

## 2017-09-11 DIAGNOSIS — G894 Chronic pain syndrome: Secondary | ICD-10-CM | POA: Diagnosis not present

## 2017-09-11 DIAGNOSIS — Z79891 Long term (current) use of opiate analgesic: Secondary | ICD-10-CM | POA: Diagnosis not present

## 2017-09-14 ENCOUNTER — Ambulatory Visit (HOSPITAL_COMMUNITY)
Admission: RE | Admit: 2017-09-14 | Discharge: 2017-09-14 | Disposition: A | Discharge status: Home / Self Care | Payer: Medicare Other | Source: Ambulatory Visit | Attending: Vascular Surgery | Admitting: Vascular Surgery

## 2017-09-14 ENCOUNTER — Encounter: Payer: Self-pay | Admitting: Vascular Surgery

## 2017-09-14 ENCOUNTER — Ambulatory Visit (INDEPENDENT_AMBULATORY_CARE_PROVIDER_SITE_OTHER): Payer: Medicare Other | Admitting: Vascular Surgery

## 2017-09-14 ENCOUNTER — Other Ambulatory Visit: Payer: Self-pay

## 2017-09-14 VITALS — BP 138/73 | HR 71 | Temp 97.7°F | Resp 18 | Ht 69.0 in | Wt 320.0 lb

## 2017-09-14 DIAGNOSIS — R600 Localized edema: Secondary | ICD-10-CM | POA: Diagnosis not present

## 2017-09-14 DIAGNOSIS — I83893 Varicose veins of bilateral lower extremities with other complications: Secondary | ICD-10-CM

## 2017-09-14 DIAGNOSIS — R300 Dysuria: Secondary | ICD-10-CM | POA: Diagnosis not present

## 2017-09-14 DIAGNOSIS — I872 Venous insufficiency (chronic) (peripheral): Secondary | ICD-10-CM | POA: Diagnosis not present

## 2017-09-14 DIAGNOSIS — I10 Essential (primary) hypertension: Secondary | ICD-10-CM | POA: Diagnosis not present

## 2017-09-14 DIAGNOSIS — Z6841 Body Mass Index (BMI) 40.0 and over, adult: Secondary | ICD-10-CM | POA: Diagnosis not present

## 2017-09-14 DIAGNOSIS — N390 Urinary tract infection, site not specified: Secondary | ICD-10-CM | POA: Diagnosis not present

## 2017-09-14 DIAGNOSIS — Z299 Encounter for prophylactic measures, unspecified: Secondary | ICD-10-CM | POA: Diagnosis not present

## 2017-09-14 NOTE — Progress Notes (Signed)
Patient ID: [name] [name], female   DOB: 02-Jan-1961, 57 y.o.   MRN: [id number]  Reason for Consult: New Patient (Initial Visit) (bilateral varicose veins)   Referred by Glenda Chroman, MD  Subjective:     HPI:  [name] [name] is a 57 y.o. female with history of bilateral lower extremity swelling left greater than right.  She also has varicose veins is never had any bleeding comp occasions.  She denies any other history of leg issues.  She has never had ulceration of her legs.  She continues to walk.  She denies any history of DVT.  She does take Xarelto for atrial fibrillation but has been converted in the past and has remained that way for 3 years.  She wears compression stockings knee-high daily and has for many years.  She does not have any skin changes.  She is never had a intervention of her bilateral lower extremity veins per  Past Medical History:  Diagnosis Date  . Bladder dysfunction    Self urinary catheterization  . COPD (chronic obstructive pulmonary disease) ([place])   . DDD (degenerative disc disease)   . Depression   . Esophageal spasm    NTG and Norvasc  . Essential hypertension, benign   . Fibromyalgia   . Gastroparesis   . GERD (gastroesophageal reflux disease)   . History of cardiac catheterization    Normal coronary arteries 11/12  . History of pituitary tumor   . Irritable bowel syndrome   . Lymphedema   . Osteoarthritis   . PAF (paroxysmal atrial fibrillation) ([place])   . Sleep apnea    BIPAP  . Type 2 diabetes mellitus ([place])   . Urinary tract infection    Family History  Problem Relation Age of Onset  . Coronary artery disease Father        Died age 74  . Heart attack Father   . Arrhythmia Father        AF  . Diabetes Father   . Parkinson's disease Father   . Arrhythmia Mother        AF  . Stroke Mother   . Dementia Mother   . Cancer Mother        UTERINE   . Parkinson's disease Mother   . Arrhythmia Brother        AF  . Colon cancer  Maternal Grandfather   . Colon cancer Paternal Aunt   . Colon cancer Paternal Aunt   . Diabetes Son   . Cancer Sister        BREAST   . Depression Sister   . Depression Sister   . Depression Sister    Past Surgical History:  Procedure Laterality Date  . ABDOMINAL HYSTERECTOMY    . ANTERIOR CERVICAL DECOMP/DISCECTOMY FUSION N/A 12/02/2014   Procedure: Cervical five cervical six anterior cervical decompression with fusion interbody prosthesis plating and bone graft;  Surgeon: Newman Pies, MD;  Location: [facility];  Service: Neurosurgery;  Laterality: N/A;  C56 anterior cervical decompression with fusion interbody prosthesis plating and bonegraft  . APPENDECTOMY    . BACK SURGERY    . CARDIAC CATHETERIZATION    . CARDIOVERSION N/A 03/26/2014   Procedure: CARDIOVERSION;  Surgeon: Herminio Commons, MD;  Location: [facility];  Service: Endoscopy;  Laterality: N/A;  . CARDIOVERSION N/A 05/04/2014   Procedure: CARDIOVERSION;  Surgeon: Satira Sark, MD;  Location: [facility];  Service: Cardiovascular;  Laterality: N/A;  . CHOLECYSTECTOMY    .  COLONOSCOPY     Approximately 2003. Per medical records, internal hemorrhoids noted  . COLONOSCOPY N/A 06/25/2013   Dr. Gala Romney: Anal canal hemorrhoids-more likely the source of paper hematochezia. Redundant, capacious colon. Multiple colonic polyps-tubular adenoma  . COLONOSCOPY WITH PROPOFOL N/A 12/04/2016   Procedure: COLONOSCOPY WITH PROPOFOL;  Surgeon: Daneil Dolin, MD;  Location: [facility];  Service: Endoscopy;  Laterality: N/A;  1:00pm  . ESOPHAGOGASTRODUODENOSCOPY  10/12/2004   GLO:VFIE plaquing on the esophageal mucosa of uncertain significance, not typical of what is seen with candida esophagitis status post KOH brushing for KOH prep and biopsy for histology. Rule out candida esophagitis/eosinophilic esophagitis. Otherwise normal esophagus. Tiny hiatal hernia. Otherwise, normal stomach, normal D1 and D2. Benign biopsy of esophagus, unknown  KOH status.   . ESOPHAGOGASTRODUODENOSCOPY N/A 06/25/2013   Dr. Rourk:mild chronic gastritis  . KNEE SURGERY    . LEFT HEART CATHETERIZATION WITH CORONARY ANGIOGRAM N/A 11/15/2010   Procedure: LEFT HEART CATHETERIZATION WITH CORONARY ANGIOGRAM;  Surgeon: Laverda Page, MD;  Location: [facility] CATH LAB;  Service: Cardiovascular;  Laterality: N/A;  . LUNG BIOPSY      Short Social History:  Social History   Tobacco Use  . Smoking status: Former Smoker    Packs/day: 1.50    Years: 30.00    Pack years: 45.00    Types: Cigarettes    Start date: 03/06/1977    Last attempt to quit: 01/03/2007    Years since quitting: 10.7  . Smokeless tobacco: Never Used  Substance Use Topics  . Alcohol use: No    Alcohol/week: 0.0 standard drinks    Allergies  Allergen Reactions  . Levofloxacin Other (See Comments)  . Tape Other (See Comments)    Welps, bad place on skin. Paper tape is okay  . Hyoscyamine Sulfate Other (See Comments)    TACHYCARDIA  . Metoclopramide Other (See Comments)    tremors    Current Outpatient Medications  Medication Sig Dispense Refill  . albuterol (PROAIR HFA) 108 (90 BASE) MCG/ACT inhaler Inhale 2 puffs into the lungs every 6 (six) hours as needed for wheezing or shortness of breath.     Marland Kitchen albuterol (PROVENTIL) (2.5 MG/3ML) 0.083% nebulizer solution Take 2.5 mg by nebulization every 6 (six) hours as needed for wheezing. For shortness of breath.    Marland Kitchen atorvastatin (LIPITOR) 10 MG tablet Take 10 mg by mouth at bedtime.  3  . baclofen (LIORESAL) 20 MG tablet Take 20 mg by mouth 3 (three) times daily.      . CONSTULOSE 10 GM/15ML solution TAKE 15 MLS BY MOUTH DAILY 473 mL 5  . diclofenac sodium (VOLTAREN) 1 % GEL Apply 2-4 g topically 4 (four) times daily as needed (for pain).     Marland Kitchen dicyclomine (BENTYL) 10 MG capsule Take 1 capsule (10 mg total) by mouth 4 (four) times daily -  before meals and at bedtime. For abdominal cramping and loose stools (Patient taking differently:  Take 10 mg by mouth 4 (four) times daily as needed. For abdominal cramping and loose stools) 120 capsule 5  . diltiazem (CARDIZEM CD) 120 MG 24 hr capsule TAKE ONE CAPSULE BY MOUTH EVERY DAY 90 capsule 3  . docusate sodium (COLACE) 100 MG capsule Take 100 mg by mouth 2 (two) times daily.    . flecainide (TAMBOCOR) 50 MG tablet TAKE TWO TABLETS BY MOUTH EVERY MORNING and TAKE TWO TABLETS BY MOUTH EVERY EVENING 120 tablet 3  . fluticasone (FLONASE) 50 MCG/ACT nasal spray Place  2 sprays into both nostrils daily.  6  . furosemide (LASIX) 80 MG tablet Take 80 mg by mouth 2 (two) times daily.      Marland Kitchen gabapentin (NEURONTIN) 600 MG tablet Take 600 mg by mouth 4 (four) times daily.    Marland Kitchen glimepiride (AMARYL) 2 MG tablet Take 4 mg by mouth 2 (two) times daily.     . Insulin Degludec (TRESIBA [place]) Inject 14 Units into the skin at bedtime.    . isosorbide mononitrate (IMDUR) 120 MG 24 hr tablet Take 120 mg by mouth every morning.     . isosorbide mononitrate (IMDUR) 30 MG 24 hr tablet Take 30 mg by mouth every evening.     Marland Kitchen LINZESS 290 MCG CAPS capsule TAKE ONE CAPSULE BY MOUTH EVERY DAY BEFORE BREAKFAST - REPLACES MOVANTIK 30 capsule 11  . lisinopril (PRINIVIL,ZESTRIL) 10 MG tablet Take 10 mg by mouth every evening.     . loratadine (CLARITIN) 10 MG tablet Take 10 mg by mouth daily.      . meclizine (ANTIVERT) 25 MG tablet Take 25 mg by mouth 3 (three) times daily as needed for dizziness.     . metFORMIN (GLUCOPHAGE) 500 MG tablet Take 1,000 mg by mouth 2 (two) times daily.     . Multiple Vitamin (MULTIVITAMIN WITH MINERALS) TABS tablet Take 1 tablet by mouth daily.    Marland Kitchen NEXIUM 40 MG capsule TAKE ONE CAPSULE BY MOUTH EVERY DAY BEFORE BREAKFAST (seven IN THE MORNING PER pt) 30 capsule 5  . nitroGLYCERIN (NITROLINGUAL) 0.4 MG/SPRAY spray USE 1 SPRAY UNDER THE TONGUE AS NEEDED FOR CHEST PAIN 12 g 3  . NUCYNTA ER 200 MG TB12 Take 200 mg by mouth every 12 (twelve) hours.     Marland Kitchen nystatin (MYCOSTATIN/NYSTOP) 100000  UNIT/GM POWD Apply 1 g topically daily.     Marland Kitchen omega-3 acid ethyl esters (LOVAZA) 1 g capsule Take 1 g by mouth 4 (four) times daily.     . ondansetron (ZOFRAN) 4 MG tablet TAKE 4 MG BY MOUTH EVERY 8 HOURS AS NEEDED FOR NAUSEA OR VOMITING 30 tablet 3  . oxyCODONE-acetaminophen (PERCOCET) 10-325 MG per tablet Take 1 tablet by mouth every 6 (six) hours as needed for pain.     . polyethylene glycol powder (GLYCOLAX/MIRALAX) powder Take 17 g by mouth daily. Occ 34g if needed    . potassium chloride SA (K-DUR,KLOR-CON) 20 MEQ tablet Take 40 mEq by mouth 4 (four) times daily.      . ranitidine (ZANTAC) 150 MG tablet Take 150 mg by mouth daily as needed for heartburn.     . Rivaroxaban (XARELTO) 20 MG TABS Take 20 mg by mouth every evening.     Marland Kitchen spironolactone (ALDACTONE) 50 MG tablet Take 50 mg by mouth 3 (three) times daily.      Marland Kitchen buPROPion (WELLBUTRIN) 75 MG tablet Take 1 tablet (75 mg total) by mouth daily. 30 tablet 3   No current facility-administered medications for this visit.     Review of Systems  Constitutional:  Constitutional negative. HENT: HENT negative.  Eyes: Eyes negative.  Respiratory: Positive for shortness of breath.  Cardiovascular: Positive for irregular heartbeat and leg swelling.  GI: Gastrointestinal negative.  Musculoskeletal: Musculoskeletal negative.  Skin: Skin negative.  Neurological: Neurological negative. Hematologic: Hematologic/lymphatic negative.  Psychiatric: Psychiatric negative.        Objective:  Objective   Vitals:   09/14/17 1246 09/14/17 1252  BP: (!) 147/73 138/73  Pulse: 71 71  Resp:  18   Temp: 97.7 F (36.5 C)   TempSrc: Oral   SpO2: 96%   Weight: (!) 320 lb (145.2 kg)   Height: 5\' 9"  (1.753 m)    Body mass index is 47.26 kg/m.  Physical Exam  Constitutional: She is oriented to person, place, and time. She appears well-developed.  HENT:  Head: Normocephalic.  Eyes: Pupils are equal, round, and reactive to light.  Neck: Normal  range of motion. Neck supple.  Cardiovascular: Normal rate.  Pulses:      Radial pulses are 2+ on the right side, and 2+ on the left side.       Dorsalis pedis pulses are 2+ on the right side, and 2+ on the left side.  Pulmonary/Chest: Effort normal.  Abdominal: Soft.  Musculoskeletal: Normal range of motion. She exhibits edema.  Because of recent spider veins throughout her bilateral lower extremities  Neurological: She is alert and oriented to person, place, and time.  Skin: Skin is warm and dry.  Psychiatric: She has a normal mood and affect. Her behavior is normal. Judgment and thought content normal.    Data: I have independently interpreted her bilateral lower extremity venous duplex which demonstrates reflux in her bilateral greater saphenous veins and her right small saphenous vein.  Her GSV at the junction at right 1.15cm and left 1.54cm     Assessment/Plan:    57yo female with C3 venous disease is quite significant large greater saphenous veins and refluxing throughout.  She will wear thigh-high compression stockings for 3 months with follow-up for consideration of venous ablation and possible stab phlebectomy.  I discussed with her that her weight is her greatest obstacle to getting a good result she demonstrates good understanding.      [name] Sandy MD [facility]

## 2017-09-14 NOTE — Progress Notes (Signed)
Vitals:   09/14/17 1246  BP: (!) 147/73  Pulse: 71  Resp: 18  Temp: 97.7 F (36.5 C)  TempSrc: Oral  SpO2: 96%  Weight: (!) 320 lb (145.2 kg)  Height: 5\' 9"  (1.753 m)

## 2017-09-21 DIAGNOSIS — M170 Bilateral primary osteoarthritis of knee: Secondary | ICD-10-CM | POA: Diagnosis not present

## 2017-09-28 DIAGNOSIS — I2510 Atherosclerotic heart disease of native coronary artery without angina pectoris: Secondary | ICD-10-CM | POA: Diagnosis not present

## 2017-09-28 DIAGNOSIS — M170 Bilateral primary osteoarthritis of knee: Secondary | ICD-10-CM | POA: Diagnosis not present

## 2017-09-28 DIAGNOSIS — I10 Essential (primary) hypertension: Secondary | ICD-10-CM | POA: Diagnosis not present

## 2017-09-28 DIAGNOSIS — E119 Type 2 diabetes mellitus without complications: Secondary | ICD-10-CM | POA: Diagnosis not present

## 2017-10-05 DIAGNOSIS — M170 Bilateral primary osteoarthritis of knee: Secondary | ICD-10-CM | POA: Diagnosis not present

## 2017-10-09 DIAGNOSIS — M48062 Spinal stenosis, lumbar region with neurogenic claudication: Secondary | ICD-10-CM | POA: Diagnosis not present

## 2017-10-09 DIAGNOSIS — G894 Chronic pain syndrome: Secondary | ICD-10-CM | POA: Diagnosis not present

## 2017-10-09 DIAGNOSIS — E1142 Type 2 diabetes mellitus with diabetic polyneuropathy: Secondary | ICD-10-CM | POA: Diagnosis not present

## 2017-10-09 DIAGNOSIS — Z79891 Long term (current) use of opiate analgesic: Secondary | ICD-10-CM | POA: Diagnosis not present

## 2017-10-24 ENCOUNTER — Encounter: Payer: Self-pay | Admitting: Gastroenterology

## 2017-10-24 ENCOUNTER — Ambulatory Visit (INDEPENDENT_AMBULATORY_CARE_PROVIDER_SITE_OTHER): Payer: Medicare Other | Admitting: Gastroenterology

## 2017-10-24 VITALS — BP 124/68 | HR 75 | Temp 97.6°F | Ht 69.0 in | Wt 314.6 lb

## 2017-10-24 DIAGNOSIS — R197 Diarrhea, unspecified: Secondary | ICD-10-CM | POA: Diagnosis not present

## 2017-10-24 MED ORDER — HYDROCORTISONE 2.5 % RE CREA: 1.0000 "application " | TOPICAL_CREAM | Freq: Two times a day (BID) | RECTAL | 1 refills | Status: DC

## 2017-10-24 NOTE — Patient Instructions (Signed)
I recommend stopping Linzess tomorrow and Friday. If you have stopped having diarrhea by Saturday morning, you can try taking Linzess again.  If you still have diarrhea on Friday even after stopping linzess, please complete the stool studies.  Please complete the blood work today.  I have also sent in a rectal cream to use twice a day.   We will see you in 3 months!  It was a pleasure to see you today. I strive to create trusting relationships with patients to provide genuine, compassionate, and quality care. I value your feedback. If you receive a survey regarding your visit,  I greatly appreciate you taking time to fill this out.   Annitta Needs, PhD, ANP-BC [facility]

## 2017-10-24 NOTE — Progress Notes (Signed)
Referring Provider: Glenda Chroman, MD Primary Care Physician:  Glenda Chroman, MD Primary GI: Dr. Gala Romney   Chief Complaint  Patient presents with  . Diarrhea    started last week  . Rectal Bleeding  . Nausea    HPI:   [name] [name] is a 57 y.o. female presenting today with a history of constipation, on Linzess historically. Colonoscopy up-to-date and due again Dec 2023. History of GERD, gastroparesis.   Week ago with fever, started with diarrhea although improved. Tmax of 102.4 last week. Afebrile since Friday. Had a lot of nausea but has Zofran on hand. No vomiting. No sick contacts that she is aware. Didn't feel like a virus. Saw some black stool before she got sick then had green stool, then brown. Has had some rectal bleeding. Continued with Miralax up till 3 days ago and just did 1/2 cup. Stopped lactulose when she got sick. Continued Linzess 290 mcg and stool softener. Still with waves of nausea. Lost 13 lbs in just a few days' time. States she has gained 4 lbs. Appetite is coming back. Thinks she has had 6 loose stools at least today. Every time she urinates will have small amount.   Rectal discomfort/on fire.   Past Medical History:  Diagnosis Date  . Bladder dysfunction    Self urinary catheterization  . COPD (chronic obstructive pulmonary disease) ([place])   . DDD (degenerative disc disease)   . Depression   . Esophageal spasm    NTG and Norvasc  . Essential hypertension, benign   . Fibromyalgia   . Gastroparesis   . GERD (gastroesophageal reflux disease)   . History of cardiac catheterization    Normal coronary arteries 11/12  . History of pituitary tumor   . Irritable bowel syndrome   . Lymphedema   . Osteoarthritis   . PAF (paroxysmal atrial fibrillation) ([place])   . Sleep apnea    BIPAP  . Type 2 diabetes mellitus ([place])   . Urinary tract infection     Past Surgical History:  Procedure Laterality Date  . ABDOMINAL HYSTERECTOMY    . ANTERIOR CERVICAL  DECOMP/DISCECTOMY FUSION N/A 12/02/2014   Procedure: Cervical five cervical six anterior cervical decompression with fusion interbody prosthesis plating and bone graft;  Surgeon: Newman Pies, MD;  Location: [place] NEURO ORS;  Service: Neurosurgery;  Laterality: N/A;  C56 anterior cervical decompression with fusion interbody prosthesis plating and bonegraft  . APPENDECTOMY    . BACK SURGERY    . CARDIAC CATHETERIZATION    . CARDIOVERSION N/A 03/26/2014   Procedure: CARDIOVERSION;  Surgeon: Herminio Commons, MD;  Location: [facility];  Service: Endoscopy;  Laterality: N/A;  . CARDIOVERSION N/A 05/04/2014   Procedure: CARDIOVERSION;  Surgeon: Satira Sark, MD;  Location: [facility];  Service: Cardiovascular;  Laterality: N/A;  . CHOLECYSTECTOMY    . COLONOSCOPY     Approximately 2003. Per medical records, internal hemorrhoids noted  . COLONOSCOPY N/A 06/25/2013   Dr. Gala Romney: Anal canal hemorrhoids-more likely the source of paper hematochezia. Redundant, capacious colon. Multiple colonic polyps-tubular adenoma  . COLONOSCOPY WITH PROPOFOL N/A 12/04/2016   Dr. Gala Romney: redundant colon, colonic diverticulosis  . ESOPHAGOGASTRODUODENOSCOPY  10/12/2004   [facility]:URKY plaquing on the esophageal mucosa of uncertain significance, not typical of what is seen with candida esophagitis status post KOH brushing for KOH prep and biopsy for histology. Rule out candida esophagitis/eosinophilic esophagitis. Otherwise normal esophagus. Tiny hiatal hernia. Otherwise, normal stomach, normal D1 and  D2. Benign biopsy of esophagus, unknown KOH status.   . ESOPHAGOGASTRODUODENOSCOPY N/A 06/25/2013   Dr. Rourk:mild chronic gastritis  . KNEE SURGERY    . LEFT HEART CATHETERIZATION WITH CORONARY ANGIOGRAM N/A 11/15/2010   Procedure: LEFT HEART CATHETERIZATION WITH CORONARY ANGIOGRAM;  Surgeon: Laverda Page, MD;  Location: [facility] CATH LAB;  Service: Cardiovascular;  Laterality: N/A;  . LUNG BIOPSY      Current Outpatient Medications   Medication Sig Dispense Refill  . albuterol (PROAIR HFA) 108 (90 BASE) MCG/ACT inhaler Inhale 2 puffs into the lungs every 6 (six) hours as needed for wheezing or shortness of breath.     Marland Kitchen albuterol (PROVENTIL) (2.5 MG/3ML) 0.083% nebulizer solution Take 2.5 mg by nebulization every 6 (six) hours as needed for wheezing. For shortness of breath.    Marland Kitchen atorvastatin (LIPITOR) 10 MG tablet Take 10 mg by mouth at bedtime.  3  . baclofen (LIORESAL) 20 MG tablet Take 20 mg by mouth 3 (three) times daily.      Marland Kitchen buPROPion (WELLBUTRIN) 75 MG tablet Take 1 tablet (75 mg total) by mouth daily. 30 tablet 3  . CONSTULOSE 10 GM/15ML solution TAKE 15 MLS BY MOUTH DAILY (Patient taking differently: Hasn't been taking for past week d/t diarrhea) 473 mL 5  . diclofenac sodium (VOLTAREN) 1 % GEL Apply 2-4 g topically 4 (four) times daily as needed (for pain).     Marland Kitchen diltiazem (CARDIZEM CD) 120 MG 24 hr capsule TAKE ONE CAPSULE BY MOUTH EVERY DAY 90 capsule 3  . docusate sodium (COLACE) 100 MG capsule Take 100 mg by mouth 2 (two) times daily.    . flecainide (TAMBOCOR) 50 MG tablet TAKE TWO TABLETS BY MOUTH EVERY MORNING and TAKE TWO TABLETS BY MOUTH EVERY EVENING 120 tablet 3  . fluticasone (FLONASE) 50 MCG/ACT nasal spray Place 2 sprays into both nostrils daily.  6  . furosemide (LASIX) 80 MG tablet Take 80 mg by mouth 2 (two) times daily.      Marland Kitchen gabapentin (NEURONTIN) 800 MG tablet Take 1 tablet by mouth 4 (four) times daily.  2  . glimepiride (AMARYL) 2 MG tablet Take 4 mg by mouth 2 (two) times daily.     . Insulin Degludec (TRESIBA [place]) Inject 20 Units into the skin at bedtime.     . isosorbide mononitrate (IMDUR) 120 MG 24 hr tablet Take 120 mg by mouth every morning.     . isosorbide mononitrate (IMDUR) 30 MG 24 hr tablet Take 30 mg by mouth every evening.     Marland Kitchen LINZESS 290 MCG CAPS capsule TAKE ONE CAPSULE BY MOUTH EVERY DAY BEFORE BREAKFAST - REPLACES MOVANTIK 30 capsule 11  . lisinopril (PRINIVIL,ZESTRIL)  10 MG tablet Take 10 mg by mouth every evening.     . loratadine (CLARITIN) 10 MG tablet Take 10 mg by mouth daily.      . meclizine (ANTIVERT) 25 MG tablet Take 25 mg by mouth 3 (three) times daily as needed for dizziness.     . metFORMIN (GLUCOPHAGE) 500 MG tablet Take 1,000 mg by mouth 2 (two) times daily.     . Multiple Vitamin (MULTIVITAMIN WITH MINERALS) TABS tablet Take 1 tablet by mouth daily.    Marland Kitchen NEXIUM 40 MG capsule TAKE ONE CAPSULE BY MOUTH EVERY DAY BEFORE BREAKFAST (seven IN THE MORNING PER pt) 30 capsule 5  . nitroGLYCERIN (NITROLINGUAL) 0.4 MG/SPRAY spray USE 1 SPRAY UNDER THE TONGUE AS NEEDED FOR CHEST PAIN 12 g 3  .  NUCYNTA ER 200 MG TB12 Take 200 mg by mouth every 12 (twelve) hours.     Marland Kitchen nystatin (MYCOSTATIN/NYSTOP) 100000 UNIT/GM POWD Apply 1 g topically daily.     Marland Kitchen omega-3 acid ethyl esters (LOVAZA) 1 g capsule Take 1 g by mouth 4 (four) times daily.     . ondansetron (ZOFRAN) 4 MG tablet TAKE 4 MG BY MOUTH EVERY 8 HOURS AS NEEDED FOR NAUSEA OR VOMITING 30 tablet 3  . oxyCODONE-acetaminophen (PERCOCET) 10-325 MG per tablet Take 1 tablet by mouth every 6 (six) hours as needed for pain.     . polyethylene glycol powder (GLYCOLAX/MIRALAX) powder Take 17 g by mouth daily. Takes 1/2 dose    . potassium chloride SA (K-DUR,KLOR-CON) 20 MEQ tablet Take 40 mEq by mouth 4 (four) times daily.      . ranitidine (ZANTAC) 150 MG tablet Take 150 mg by mouth daily as needed for heartburn.     . Rivaroxaban (XARELTO) 20 MG TABS Take 20 mg by mouth every evening.     Marland Kitchen spironolactone (ALDACTONE) 50 MG tablet Take 50 mg by mouth 3 (three) times daily.      Marland Kitchen dicyclomine (BENTYL) 10 MG capsule Take 1 capsule (10 mg total) by mouth 4 (four) times daily -  before meals and at bedtime. For abdominal cramping and loose stools (Patient not taking: Reported on 10/24/2017) 120 capsule 5  . gabapentin (NEURONTIN) 600 MG tablet Take 600 mg by mouth 4 (four) times daily.     No current  facility-administered medications for this visit.     Allergies as of 10/24/2017 - Review Complete 10/24/2017  Allergen Reaction Noted  . Levofloxacin Other (See Comments) 08/17/2011  . Tape Other (See Comments) 11/21/2016  . Hyoscyamine sulfate Other (See Comments)   . Metoclopramide Other (See Comments) 08/17/2011    Family History  Problem Relation Age of Onset  . Coronary artery disease Father        Died age 84  . Heart attack Father   . Arrhythmia Father        AF  . Diabetes Father   . Parkinson's disease Father   . Arrhythmia Mother        AF  . Stroke Mother   . Dementia Mother   . Cancer Mother        UTERINE   . Parkinson's disease Mother   . Arrhythmia Brother        AF  . Colon cancer Maternal Grandfather   . Colon cancer Paternal Aunt   . Colon cancer Paternal Aunt   . Diabetes Son   . Cancer Sister        BREAST   . Depression Sister   . Depression Sister   . Depression Sister     Social History   Socioeconomic History  . Marital status: Divorced    Spouse name: Not on file  . Number of children: Not on file  . Years of education: Not on file  . Highest education level: Not on file  Occupational History  . Not on file  Social Needs  . Financial resource strain: Not on file  . Food insecurity:    Worry: Not on file    Inability: Not on file  . Transportation needs:    Medical: Not on file    Non-medical: Not on file  Tobacco Use  . Smoking status: Former Smoker    Packs/day: 1.50    Years: 30.00    Pack  years: 45.00    Types: Cigarettes    Start date: 03/06/1977    Last attempt to quit: 01/03/2007    Years since quitting: 10.8  . Smokeless tobacco: Never Used  Substance and Sexual Activity  . Alcohol use: No    Alcohol/week: 0.0 standard drinks  . Drug use: No  . Sexual activity: Never    Birth control/protection: Surgical  Lifestyle  . Physical activity:    Days per week: Not on file    Minutes per session: Not on file  .  Stress: Not on file  Relationships  . Social connections:    Talks on phone: Not on file    Gets together: Not on file    Attends religious service: Not on file    Active member of club or organization: Not on file    Attends meetings of clubs or organizations: Not on file    Relationship status: Not on file  Other Topics Concern  . Not on file  Social History Narrative  . Not on file    Review of Systems: As mentioned in HPI   Physical Exam: BP 124/68   Pulse 75   Temp 97.6 F (36.4 C) (Oral)   Ht 5\' 9"  (1.753 m)   Wt (!) 314 lb 9.6 oz (142.7 kg)   BMI 46.46 kg/m  General:   Alert and oriented. No distress noted. Pleasant and cooperative.  Head:  Normocephalic and atraumatic. Eyes:  Conjuctiva clear without scleral icterus. Mouth:  Oral mucosa pink and moist.  Abdomen:  +BS, soft, non-tender and non-distended. No rebound or guarding. No HSM or masses noted. Msk:  Symmetrical without gross deformities. Normal posture. Extremities:  Without edema. Neurologic:  Alert and  oriented x4 Psych:  Alert and cooperative. Normal mood and affect.

## 2017-10-25 ENCOUNTER — Encounter: Payer: Self-pay | Admitting: Internal Medicine

## 2017-10-25 LAB — CBC WITH DIFFERENTIAL/PLATELET
BASOS PCT: 0.7 %
Basophils Absolute: 84 cells/uL (ref 0–200)
Eosinophils Absolute: 228 cells/uL (ref 15–500)
Eosinophils Relative: 1.9 %
HCT: 36.1 % (ref 35.0–45.0)
Hemoglobin: 12.2 g/dL (ref 11.7–15.5)
Lymphs Abs: 2952 cells/uL (ref 850–3900)
MCH: 27.6 pg (ref 27.0–33.0)
MCHC: 33.8 g/dL (ref 32.0–36.0)
MCV: 81.7 fL (ref 80.0–100.0)
MONOS PCT: 7.6 %
MPV: 9.9 fL (ref 7.5–12.5)
Neutro Abs: 7824 cells/uL — ABNORMAL HIGH (ref 1500–7800)
Neutrophils Relative %: 65.2 %
PLATELETS: 385 10*3/uL (ref 140–400)
RBC: 4.42 10*6/uL (ref 3.80–5.10)
RDW: 13.4 % (ref 11.0–15.0)
TOTAL LYMPHOCYTE: 24.6 %
WBC: 12 10*3/uL — AB (ref 3.8–10.8)
WBCMIX: 912 {cells}/uL (ref 200–950)

## 2017-10-31 NOTE — Progress Notes (Signed)
Mild increased white count. Awaiting stool studies.

## 2017-11-01 NOTE — Assessment & Plan Note (Addendum)
Seems to have had a bout with gastroenteritis and continues with diarrhea likely secondary to continuing Linzess 290 mcg and stool softener. I will have her stop this for 24-48 hours. If persistent, needs stool studies. May be able to resume Linzess again in near future once returns to baseline bowel habits. Anusol BID for rectal discomfort. Check CBC today.

## 2017-11-02 NOTE — Progress Notes (Signed)
CC'D TO PCP °

## 2017-11-08 DIAGNOSIS — Z79891 Long term (current) use of opiate analgesic: Secondary | ICD-10-CM | POA: Diagnosis not present

## 2017-11-08 DIAGNOSIS — M48062 Spinal stenosis, lumbar region with neurogenic claudication: Secondary | ICD-10-CM | POA: Diagnosis not present

## 2017-11-08 DIAGNOSIS — G894 Chronic pain syndrome: Secondary | ICD-10-CM | POA: Diagnosis not present

## 2017-11-08 DIAGNOSIS — E1142 Type 2 diabetes mellitus with diabetic polyneuropathy: Secondary | ICD-10-CM | POA: Diagnosis not present

## 2017-11-12 NOTE — Progress Notes (Signed)
Cardiology Office Note  Date: 11/14/2017   ID: [name] [name], DOB 1960/03/07, MRN [id number]  PCP: Glenda Chroman, MD  Primary Cardiologist: Rozann Lesches, MD   Chief Complaint  Patient presents with  . Atrial Fibrillation    History of Present Illness: [name] [name] is a 57 y.o. female last seen in May.  She is here for a follow-up visit.  She states that she has been under a lot of stress, her 90 year old son lost his job and has been living with her in a small apartment for the last few months.  She has been having some breakthrough chest pain with palpitations, but states that these are brief events.  She reports compliance with her cardiac medications as listed below.  No bleeding episodes are indicated on Xarelto.  I personally reviewed her ECG today which shows sinus rhythm with prolonged PR interval and IVCD.  I also reviewed her interval lab work which is outlined below.  Past Medical History:  Diagnosis Date  . Bladder dysfunction    Self urinary catheterization  . COPD (chronic obstructive pulmonary disease) ([place])   . DDD (degenerative disc disease)   . Depression   . Esophageal spasm    NTG and Norvasc  . Essential hypertension, benign   . Fibromyalgia   . Gastroparesis   . GERD (gastroesophageal reflux disease)   . History of cardiac catheterization    Normal coronary arteries 11/12  . History of pituitary tumor   . Irritable bowel syndrome   . Lymphedema   . Osteoarthritis   . PAF (paroxysmal atrial fibrillation) ([place])   . Sleep apnea    BIPAP  . Type 2 diabetes mellitus ([place])   . Urinary tract infection     Past Surgical History:  Procedure Laterality Date  . ABDOMINAL HYSTERECTOMY    . ANTERIOR CERVICAL DECOMP/DISCECTOMY FUSION N/A 12/02/2014   Procedure: Cervical five cervical six anterior cervical decompression with fusion interbody prosthesis plating and bone graft;  Surgeon: Newman Pies, MD;  Location: [facility];  Service:  Neurosurgery;  Laterality: N/A;  C56 anterior cervical decompression with fusion interbody prosthesis plating and bonegraft  . APPENDECTOMY    . BACK SURGERY    . CARDIAC CATHETERIZATION    . CARDIOVERSION N/A 03/26/2014   Procedure: CARDIOVERSION;  Surgeon: Herminio Commons, MD;  Location: [facility];  Service: Endoscopy;  Laterality: N/A;  . CARDIOVERSION N/A 05/04/2014   Procedure: CARDIOVERSION;  Surgeon: Satira Sark, MD;  Location: [facility];  Service: Cardiovascular;  Laterality: N/A;  . CHOLECYSTECTOMY    . COLONOSCOPY     Approximately 2003. Per medical records, internal hemorrhoids noted  . COLONOSCOPY N/A 06/25/2013   Dr. Gala Romney: Anal canal hemorrhoids-more likely the source of paper hematochezia. Redundant, capacious colon. Multiple colonic polyps-tubular adenoma  . COLONOSCOPY WITH PROPOFOL N/A 12/04/2016   Dr. Gala Romney: redundant colon, colonic diverticulosis  . ESOPHAGOGASTRODUODENOSCOPY  10/12/2004   WJX:BJYN plaquing on the esophageal mucosa of uncertain significance, not typical of what is seen with candida esophagitis status post KOH brushing for KOH prep and biopsy for histology. Rule out candida esophagitis/eosinophilic esophagitis. Otherwise normal esophagus. Tiny hiatal hernia. Otherwise, normal stomach, normal D1 and D2. Benign biopsy of esophagus, unknown KOH status.   . ESOPHAGOGASTRODUODENOSCOPY N/A 06/25/2013   Dr. Rourk:mild chronic gastritis  . KNEE SURGERY    . LEFT HEART CATHETERIZATION WITH CORONARY ANGIOGRAM N/A 11/15/2010   Procedure: LEFT HEART CATHETERIZATION WITH CORONARY ANGIOGRAM;  Surgeon: Turner Daniels  Einar Gip, MD;  Location: [facility];  Service: Cardiovascular;  Laterality: N/A;  . LUNG BIOPSY      Current Outpatient Medications  Medication Sig Dispense Refill  . albuterol (PROAIR HFA) 108 (90 BASE) MCG/ACT inhaler Inhale 2 puffs into the lungs every 6 (six) hours as needed for wheezing or shortness of breath.     Marland Kitchen albuterol (PROVENTIL) (2.5 MG/3ML) 0.083%  nebulizer solution Take 2.5 mg by nebulization every 6 (six) hours as needed for wheezing. For shortness of breath.    Marland Kitchen atorvastatin (LIPITOR) 10 MG tablet Take 10 mg by mouth at bedtime.  3  . baclofen (LIORESAL) 20 MG tablet Take 20 mg by mouth 3 (three) times daily.      Marland Kitchen buPROPion (WELLBUTRIN) 75 MG tablet Take 1 tablet (75 mg total) by mouth daily. 30 tablet 3  . CONSTULOSE 10 GM/15ML solution TAKE 15 MLS BY MOUTH DAILY (Patient taking differently: Hasn't been taking for past week d/t diarrhea) 473 mL 5  . diclofenac sodium (VOLTAREN) 1 % GEL Apply 2-4 g topically 4 (four) times daily as needed (for pain).     Marland Kitchen diltiazem (CARDIZEM CD) 120 MG 24 hr capsule TAKE ONE CAPSULE BY MOUTH EVERY DAY 90 capsule 3  . docusate sodium (COLACE) 100 MG capsule Take 100 mg by mouth 2 (two) times daily.    . flecainide (TAMBOCOR) 50 MG tablet TAKE TWO TABLETS BY MOUTH EVERY MORNING and TAKE TWO TABLETS BY MOUTH EVERY EVENING 120 tablet 3  . fluticasone (FLONASE) 50 MCG/ACT nasal spray Place 2 sprays into both nostrils daily.  6  . furosemide (LASIX) 80 MG tablet Take 80 mg by mouth 2 (two) times daily.      Marland Kitchen gabapentin (NEURONTIN) 800 MG tablet Take 1 tablet by mouth 4 (four) times daily.  2  . glimepiride (AMARYL) 2 MG tablet Take 4 mg by mouth 2 (two) times daily.     . hydrocortisone (ANUSOL-HC) 2.5 % rectal cream Place 1 application rectally 2 (two) times daily. 30 g 1  . Insulin Degludec (TRESIBA [place]) Inject 20 Units into the skin at bedtime.     . isosorbide mononitrate (IMDUR) 120 MG 24 hr tablet Take 120 mg by mouth every morning.     . isosorbide mononitrate (IMDUR) 30 MG 24 hr tablet Take 30 mg by mouth every evening.     Marland Kitchen LINZESS 290 MCG CAPS capsule TAKE ONE CAPSULE BY MOUTH EVERY DAY BEFORE BREAKFAST - REPLACES MOVANTIK 30 capsule 11  . lisinopril (PRINIVIL,ZESTRIL) 10 MG tablet Take 10 mg by mouth every evening.     . loratadine (CLARITIN) 10 MG tablet Take 10 mg by mouth daily.      .  meclizine (ANTIVERT) 25 MG tablet Take 25 mg by mouth 3 (three) times daily as needed for dizziness.     . metFORMIN (GLUCOPHAGE) 500 MG tablet Take 1,000 mg by mouth 2 (two) times daily.     . Multiple Vitamin (MULTIVITAMIN WITH MINERALS) TABS tablet Take 1 tablet by mouth daily.    Marland Kitchen NEXIUM 40 MG capsule TAKE ONE CAPSULE BY MOUTH EVERY DAY BEFORE BREAKFAST (seven IN THE MORNING PER pt) 30 capsule 5  . nitroGLYCERIN (NITROLINGUAL) 0.4 MG/SPRAY spray USE 1 SPRAY UNDER THE TONGUE AS NEEDED FOR CHEST PAIN 12 g 3  . NUCYNTA ER 200 MG TB12 Take 200 mg by mouth every 12 (twelve) hours.     Marland Kitchen nystatin (MYCOSTATIN/NYSTOP) 100000 UNIT/GM POWD Apply 1 g topically daily.     Marland Kitchen  omega-3 acid ethyl esters (LOVAZA) 1 g capsule Take 1 g by mouth 4 (four) times daily.     . ondansetron (ZOFRAN) 4 MG tablet TAKE 4 MG BY MOUTH EVERY 8 HOURS AS NEEDED FOR NAUSEA OR VOMITING 30 tablet 3  . oxyCODONE-acetaminophen (PERCOCET) 10-325 MG per tablet Take 1 tablet by mouth every 6 (six) hours as needed for pain.     . polyethylene glycol powder (GLYCOLAX/MIRALAX) powder Take 17 g by mouth daily. Takes 1/2 dose    . potassium chloride SA (K-DUR,KLOR-CON) 20 MEQ tablet Take 40 mEq by mouth 4 (four) times daily.      . ranitidine (ZANTAC) 150 MG tablet Take 150 mg by mouth daily as needed for heartburn.     . Rivaroxaban (XARELTO) 20 MG TABS Take 20 mg by mouth every evening.     Marland Kitchen spironolactone (ALDACTONE) 50 MG tablet Take 50 mg by mouth 3 (three) times daily.       No current facility-administered medications for this visit.    Allergies:  Levofloxacin; Tape; Hyoscyamine sulfate; and Metoclopramide   Social History: The patient  reports that she quit smoking about 10 years ago. Her smoking use included cigarettes. She started smoking about 40 years ago. She has a 45.00 pack-year smoking history. She has never used smokeless tobacco. She reports that she does not drink alcohol or use drugs.   ROS:  Please see the  history of present illness. Otherwise, complete review of systems is positive for chronic leg pain, venous varicosities.  All other systems are reviewed and negative.   Physical Exam: VS:  BP 124/69   Pulse 74   Ht 5\' 9"  (1.753 m)   Wt (!) 327 lb 6.4 oz (148.5 kg)   SpO2 100%   BMI 48.35 kg/m , BMI Body mass index is 48.35 kg/m.  Wt Readings from Last 3 Encounters:  11/14/17 (!) 327 lb 6.4 oz (148.5 kg)  10/24/17 (!) 314 lb 9.6 oz (142.7 kg)  09/14/17 (!) 320 lb (145.2 kg)    General: Obese woman, appears comfortable at rest. HEENT: Conjunctiva and lids normal, oropharynx clear. Neck: Supple, no elevated JVP or carotid bruits, no thyromegaly. Lungs: Clear to auscultation, nonlabored breathing at rest. Cardiac: Regular rate and rhythm, no S3 or significant systolic murmur. Abdomen: Soft, nontender, bowel sounds present. Extremities: Venous stasis, distal pulses 2+. Skin: Warm and dry. Musculoskeletal: No kyphosis. Neuropsychiatric: Alert and oriented x3, affect grossly appropriate.  ECG: I personally reviewed the tracing from 10/30/2016 which showed sinus rhythm with right bundle branch block.  Recent Labwork: 10/24/2017: Hemoglobin 12.2; Platelets 385  June 2019: BUN 14, creatinine 0.55, potassium 4.7, AST 13, ALT 8, cholesterol 129, 2 glycerides 155, HDL 38, LDL 60, hemoglobin 12.1, platelets 324, TSH 1.76  Other Studies Reviewed Today:  Echocardiogram 09/15/2015: Study Conclusions  - Left ventricle: The cavity size was normal. Systolic function was normal. The estimated ejection fraction was in the range of 60% to 65%. Wall motion was normal; there were no regional wall motion abnormalities. Doppler parameters are consistent with abnormal left ventricular relaxation (grade 1 diastolic dysfunction). Mild concentric and moderate focal basal septal hypertrophy.  Assessment and Plan:  1.  Paroxysmal atrial fibrillation.  No progressive, prolonged events,  tolerating current regimen which includes Cardizem CD, flecainide, and Xarelto.  I did review her interval lab work.  She will follow-up with CBC and BMET in 6 months.  2.  Morbid obesity and OSA.  She reports compliance with BiPAP  at nighttime.  She has struggled to lose weight.  3.  Essential hypertension, blood pressure is well controlled today.  Keep follow-up with Dr. Woody Seller.  4.  Uncontrolled type 2 diabetes mellitus.  She continues to follow with Dr. Woody Seller.  Current medicines were reviewed with the patient today.   Orders Placed This Encounter  Procedures  . Basic metabolic panel  . CBC  . EKG 12-Lead    Disposition: Follow-up in 6 months.  Signed, Satira Sark, MD, [facility] 11/14/2017 2:07 PM    [facility] [place], [place], [place] [address] Phone: [phone]; Fax: [phone]

## 2017-11-14 ENCOUNTER — Encounter: Payer: Self-pay | Admitting: Cardiology

## 2017-11-14 ENCOUNTER — Ambulatory Visit (INDEPENDENT_AMBULATORY_CARE_PROVIDER_SITE_OTHER): Payer: Medicare Other | Admitting: Cardiology

## 2017-11-14 VITALS — BP 124/69 | HR 74 | Ht 69.0 in | Wt 327.4 lb

## 2017-11-14 DIAGNOSIS — G4733 Obstructive sleep apnea (adult) (pediatric): Secondary | ICD-10-CM | POA: Diagnosis not present

## 2017-11-14 DIAGNOSIS — I10 Essential (primary) hypertension: Secondary | ICD-10-CM | POA: Diagnosis not present

## 2017-11-14 DIAGNOSIS — E1165 Type 2 diabetes mellitus with hyperglycemia: Secondary | ICD-10-CM | POA: Diagnosis not present

## 2017-11-14 DIAGNOSIS — Z79899 Other long term (current) drug therapy: Secondary | ICD-10-CM

## 2017-11-14 DIAGNOSIS — I480 Paroxysmal atrial fibrillation: Secondary | ICD-10-CM | POA: Diagnosis not present

## 2017-11-14 NOTE — Patient Instructions (Addendum)
Medication Instructions:   Your physician recommends that you continue on your current medications as directed. Please refer to the Current Medication list given to you today.  Labwork:  Your physician recommends that you return for lab work in: 6 months just before your next visit to check your BMET & CBC.  Testing/Procedures:  NONE  Follow-Up:  Your physician recommends that you schedule a follow-up appointment in: 6 months. You will receive a reminder letter in the mail in about 4 months reminding you to call and schedule your appointment. If you don't receive this letter, please contact our office.  Any Other Special Instructions Will Be Listed Below (If Applicable).  If you need a refill on your cardiac medications before your next appointment, please call your pharmacy. 

## 2017-11-26 DIAGNOSIS — Z23 Encounter for immunization: Secondary | ICD-10-CM | POA: Diagnosis not present

## 2017-11-26 DIAGNOSIS — I10 Essential (primary) hypertension: Secondary | ICD-10-CM | POA: Diagnosis not present

## 2017-11-26 DIAGNOSIS — E119 Type 2 diabetes mellitus without complications: Secondary | ICD-10-CM | POA: Diagnosis not present

## 2017-11-26 DIAGNOSIS — I2510 Atherosclerotic heart disease of native coronary artery without angina pectoris: Secondary | ICD-10-CM | POA: Diagnosis not present

## 2017-11-27 DIAGNOSIS — I4891 Unspecified atrial fibrillation: Secondary | ICD-10-CM | POA: Diagnosis not present

## 2017-11-27 DIAGNOSIS — I10 Essential (primary) hypertension: Secondary | ICD-10-CM | POA: Diagnosis not present

## 2017-11-27 DIAGNOSIS — Z6841 Body Mass Index (BMI) 40.0 and over, adult: Secondary | ICD-10-CM | POA: Diagnosis not present

## 2017-11-27 DIAGNOSIS — E1165 Type 2 diabetes mellitus with hyperglycemia: Secondary | ICD-10-CM | POA: Diagnosis not present

## 2017-11-27 DIAGNOSIS — E1142 Type 2 diabetes mellitus with diabetic polyneuropathy: Secondary | ICD-10-CM | POA: Diagnosis not present

## 2017-11-27 DIAGNOSIS — Z299 Encounter for prophylactic measures, unspecified: Secondary | ICD-10-CM | POA: Diagnosis not present

## 2017-12-04 DIAGNOSIS — E1142 Type 2 diabetes mellitus with diabetic polyneuropathy: Secondary | ICD-10-CM | POA: Diagnosis not present

## 2017-12-04 DIAGNOSIS — G894 Chronic pain syndrome: Secondary | ICD-10-CM | POA: Diagnosis not present

## 2017-12-04 DIAGNOSIS — Z79891 Long term (current) use of opiate analgesic: Secondary | ICD-10-CM | POA: Diagnosis not present

## 2017-12-04 DIAGNOSIS — M48062 Spinal stenosis, lumbar region with neurogenic claudication: Secondary | ICD-10-CM | POA: Diagnosis not present

## 2017-12-06 DIAGNOSIS — M76821 Posterior tibial tendinitis, right leg: Secondary | ICD-10-CM | POA: Diagnosis not present

## 2017-12-06 DIAGNOSIS — M79671 Pain in right foot: Secondary | ICD-10-CM | POA: Diagnosis not present

## 2017-12-12 ENCOUNTER — Other Ambulatory Visit: Payer: Self-pay | Admitting: Cardiology

## 2017-12-13 ENCOUNTER — Ambulatory Visit: Payer: Self-pay | Admitting: Vascular Surgery

## 2018-01-07 DIAGNOSIS — G894 Chronic pain syndrome: Secondary | ICD-10-CM | POA: Diagnosis not present

## 2018-01-07 DIAGNOSIS — M48062 Spinal stenosis, lumbar region with neurogenic claudication: Secondary | ICD-10-CM | POA: Diagnosis not present

## 2018-01-07 DIAGNOSIS — Z79891 Long term (current) use of opiate analgesic: Secondary | ICD-10-CM | POA: Diagnosis not present

## 2018-01-07 DIAGNOSIS — E1142 Type 2 diabetes mellitus with diabetic polyneuropathy: Secondary | ICD-10-CM | POA: Diagnosis not present

## 2018-01-09 ENCOUNTER — Telehealth: Payer: Self-pay

## 2018-01-09 NOTE — Telephone Encounter (Signed)
PA for Nexium was submitted through [email or web address]. waiting on approval or denial.

## 2018-01-10 NOTE — Telephone Encounter (Signed)
PA for Nexium was approved through [email or web address]. When approval letter is received, it will be scanned in chart.

## 2018-01-18 DIAGNOSIS — I10 Essential (primary) hypertension: Secondary | ICD-10-CM | POA: Diagnosis not present

## 2018-01-18 DIAGNOSIS — I4891 Unspecified atrial fibrillation: Secondary | ICD-10-CM | POA: Diagnosis not present

## 2018-01-18 DIAGNOSIS — Z299 Encounter for prophylactic measures, unspecified: Secondary | ICD-10-CM | POA: Diagnosis not present

## 2018-01-18 DIAGNOSIS — S90424A Blister (nonthermal), right lesser toe(s), initial encounter: Secondary | ICD-10-CM | POA: Diagnosis not present

## 2018-01-18 DIAGNOSIS — M25552 Pain in left hip: Secondary | ICD-10-CM | POA: Diagnosis not present

## 2018-01-18 DIAGNOSIS — Z87891 Personal history of nicotine dependence: Secondary | ICD-10-CM | POA: Diagnosis not present

## 2018-01-18 DIAGNOSIS — Z6841 Body Mass Index (BMI) 40.0 and over, adult: Secondary | ICD-10-CM | POA: Diagnosis not present

## 2018-01-18 DIAGNOSIS — J019 Acute sinusitis, unspecified: Secondary | ICD-10-CM | POA: Diagnosis not present

## 2018-01-30 ENCOUNTER — Ambulatory Visit (INDEPENDENT_AMBULATORY_CARE_PROVIDER_SITE_OTHER): Payer: Medicare Other | Admitting: Gastroenterology

## 2018-01-30 ENCOUNTER — Encounter: Payer: Self-pay | Admitting: Gastroenterology

## 2018-01-30 DIAGNOSIS — T402X5A Adverse effect of other opioids, initial encounter: Secondary | ICD-10-CM | POA: Diagnosis not present

## 2018-01-30 DIAGNOSIS — K5903 Drug induced constipation: Secondary | ICD-10-CM

## 2018-01-30 NOTE — Progress Notes (Signed)
CC'D TO PCP °

## 2018-01-30 NOTE — Patient Instructions (Signed)
I would like for you to try Amitiza instead of Linzess. Start taking one gelcap twice a day WITH FOOD to avoid nausea. I have given you samples. You can still take Miralax, lactulose, and stool softeners as needed. Hopefully, we can limit the over-the-counter agents.   We will see you back in 3-4 months to make sure you are doing well.  Please call with an update on Amitiza, so we can either change therapy or send in prescription!  I enjoyed seeing you again today! As you know, I value our relationship and want to provide genuine, compassionate, and quality care. I welcome your feedback. If you receive a survey regarding your visit,  I greatly appreciate you taking time to fill this out. See you next time!  Annitta Needs, PhD, ANP-BC [facility]

## 2018-01-30 NOTE — Assessment & Plan Note (Signed)
Previously failing Linzess, which is unpredictable and requiring multiple other OTC agents and lactulose (which was prescribed due to increased cost of Miralax OTC as it is not prescription any longer). Movantik in the past without much improvement per patient. Will trial Amitiza 24 mcg po BID with food. Samples provided. Call with progress report. Return in 3-4 months.

## 2018-01-30 NOTE — Progress Notes (Signed)
Referring Provider: Glenda Chroman, MD Primary Care Physician:  Glenda Chroman, MD Primary GI: Dr. Gala Romney   Chief Complaint  Patient presents with  . Constipation    HPI:   [name] [name] is a 58 y.o. female presenting today with a history of chronic constipation, on Linzess historically. Colonoscopy up-to-date and due again in 2023. Also pertinent GI history includes GERD and gastroparesis. Last seen in Oct 2019 and felt to be dealing with gastroenteritis.   Linzess 290 mcg daily, Miralax in morning and at night prn, lactulose daily, two colace BID. Movantik did not work in the past. Tries to have a BM every day. Cheese, corn constipates her. Was placed on lactulose as insurance covers this not Miralax.   No issues with GERD currently.   Past Medical History:  Diagnosis Date  . Bladder dysfunction    Self urinary catheterization  . COPD (chronic obstructive pulmonary disease) ([place])   . DDD (degenerative disc disease)   . Depression   . Esophageal spasm    NTG and Norvasc  . Essential hypertension, benign   . Fibromyalgia   . Gastroparesis   . GERD (gastroesophageal reflux disease)   . History of cardiac catheterization    Normal coronary arteries 11/12  . History of pituitary tumor   . Irritable bowel syndrome   . Lymphedema   . Osteoarthritis   . PAF (paroxysmal atrial fibrillation) ([place])   . Sleep apnea    BIPAP  . Type 2 diabetes mellitus ([place])   . Urinary tract infection     Past Surgical History:  Procedure Laterality Date  . ABDOMINAL HYSTERECTOMY    . ANTERIOR CERVICAL DECOMP/DISCECTOMY FUSION N/A 12/02/2014   Procedure: Cervical five cervical six anterior cervical decompression with fusion interbody prosthesis plating and bone graft;  Surgeon: Newman Pies, MD;  Location: [facility];  Service: Neurosurgery;  Laterality: N/A;  C56 anterior cervical decompression with fusion interbody prosthesis plating and bonegraft  . APPENDECTOMY    . BACK SURGERY      . CARDIAC CATHETERIZATION    . CARDIOVERSION N/A 03/26/2014   Procedure: CARDIOVERSION;  Surgeon: Herminio Commons, MD;  Location: [facility];  Service: Endoscopy;  Laterality: N/A;  . CARDIOVERSION N/A 05/04/2014   Procedure: CARDIOVERSION;  Surgeon: Satira Sark, MD;  Location: [facility];  Service: Cardiovascular;  Laterality: N/A;  . CHOLECYSTECTOMY    . COLONOSCOPY     Approximately 2003. Per medical records, internal hemorrhoids noted  . COLONOSCOPY N/A 06/25/2013   Dr. Gala Romney: Anal canal hemorrhoids-more likely the source of paper hematochezia. Redundant, capacious colon. Multiple colonic polyps-tubular adenoma  . COLONOSCOPY WITH PROPOFOL N/A 12/04/2016   Dr. Gala Romney: redundant colon, colonic diverticulosis  . ESOPHAGOGASTRODUODENOSCOPY  10/12/2004   TGY:BWLS plaquing on the esophageal mucosa of uncertain significance, not typical of what is seen with candida esophagitis status post KOH brushing for KOH prep and biopsy for histology. Rule out candida esophagitis/eosinophilic esophagitis. Otherwise normal esophagus. Tiny hiatal hernia. Otherwise, normal stomach, normal D1 and D2. Benign biopsy of esophagus, unknown KOH status.   . ESOPHAGOGASTRODUODENOSCOPY N/A 06/25/2013   Dr. Rourk:mild chronic gastritis  . KNEE SURGERY    . LEFT HEART CATHETERIZATION WITH CORONARY ANGIOGRAM N/A 11/15/2010   Procedure: LEFT HEART CATHETERIZATION WITH CORONARY ANGIOGRAM;  Surgeon: Laverda Page, MD;  Location: [facility];  Service: Cardiovascular;  Laterality: N/A;  . LUNG BIOPSY      Current Outpatient Medications  Medication Sig Dispense Refill  .  albuterol (PROAIR HFA) 108 (90 BASE) MCG/ACT inhaler Inhale 2 puffs into the lungs every 6 (six) hours as needed for wheezing or shortness of breath.     Marland Kitchen albuterol (PROVENTIL) (2.5 MG/3ML) 0.083% nebulizer solution Take 2.5 mg by nebulization every 6 (six) hours as needed for wheezing. For shortness of breath.    Marland Kitchen atorvastatin (LIPITOR) 10 MG tablet  Take 10 mg by mouth at bedtime.  3  . baclofen (LIORESAL) 20 MG tablet Take 20 mg by mouth 3 (three) times daily.      Marland Kitchen buPROPion (WELLBUTRIN) 75 MG tablet Take 1 tablet (75 mg total) by mouth daily. 30 tablet 3  . CONSTULOSE 10 GM/15ML solution TAKE 15 MLS BY MOUTH DAILY (Patient taking differently: Hasn't been taking for past week d/t diarrhea) 473 mL 5  . diclofenac sodium (VOLTAREN) 1 % GEL Apply 2-4 g topically 4 (four) times daily as needed (for pain).     Marland Kitchen diltiazem (CARDIZEM CD) 120 MG 24 hr capsule TAKE ONE CAPSULE BY MOUTH EVERY DAY 90 capsule 3  . docusate sodium (COLACE) 100 MG capsule Take 100 mg by mouth 2 (two) times daily.    . flecainide (TAMBOCOR) 50 MG tablet TAKE TWO TABLETS BY MOUTH EVERY MORNING and TAKE TWO TABLETS BY MOUTH EVERY EVENING 120 tablet 3  . fluticasone (FLONASE) 50 MCG/ACT nasal spray Place 2 sprays into both nostrils daily.  6  . furosemide (LASIX) 80 MG tablet Take 80 mg by mouth 2 (two) times daily.      Marland Kitchen gabapentin (NEURONTIN) 800 MG tablet Take 1 tablet by mouth 4 (four) times daily.  2  . glimepiride (AMARYL) 2 MG tablet Take 4 mg by mouth 2 (two) times daily.     . hydrocortisone (ANUSOL-HC) 2.5 % rectal cream Place 1 application rectally 2 (two) times daily. 30 g 1  . Insulin Degludec (TRESIBA [place]) Inject 20 Units into the skin at bedtime.     . isosorbide mononitrate (IMDUR) 120 MG 24 hr tablet Take 120 mg by mouth every morning.     . isosorbide mononitrate (IMDUR) 30 MG 24 hr tablet Take 30 mg by mouth every evening.     Marland Kitchen LINZESS 290 MCG CAPS capsule TAKE ONE CAPSULE BY MOUTH EVERY DAY BEFORE BREAKFAST - REPLACES MOVANTIK 30 capsule 11  . lisinopril (PRINIVIL,ZESTRIL) 10 MG tablet Take 10 mg by mouth every evening.     . loratadine (CLARITIN) 10 MG tablet Take 10 mg by mouth daily.      . meclizine (ANTIVERT) 25 MG tablet Take 25 mg by mouth 3 (three) times daily as needed for dizziness.     . metFORMIN (GLUCOPHAGE) 500 MG tablet Take 1,000 mg by  mouth 2 (two) times daily.     . Multiple Vitamin (MULTIVITAMIN WITH MINERALS) TABS tablet Take 1 tablet by mouth daily.    Marland Kitchen NEXIUM 40 MG capsule TAKE ONE CAPSULE BY MOUTH EVERY DAY BEFORE BREAKFAST (seven IN THE MORNING PER pt) 30 capsule 5  . nitroGLYCERIN (NITROLINGUAL) 0.4 MG/SPRAY spray USE 1 SPRAY UNDER THE TONGUE AS NEEDED FOR CHEST PAIN 12 g 3  . NUCYNTA ER 200 MG TB12 Take 200 mg by mouth every 12 (twelve) hours.     Marland Kitchen nystatin (MYCOSTATIN/NYSTOP) 100000 UNIT/GM POWD Apply 1 g topically daily.     Marland Kitchen omega-3 acid ethyl esters (LOVAZA) 1 g capsule Take 1 g by mouth 4 (four) times daily.     . ondansetron (ZOFRAN) 4 MG tablet  TAKE 4 MG BY MOUTH EVERY 8 HOURS AS NEEDED FOR NAUSEA OR VOMITING 30 tablet 3  . oxyCODONE-acetaminophen (PERCOCET) 10-325 MG per tablet Take 1 tablet by mouth every 6 (six) hours as needed for pain.     . polyethylene glycol powder (GLYCOLAX/MIRALAX) powder Take 17 g by mouth daily. Takes 1/2 dose    . potassium chloride SA (K-DUR,KLOR-CON) 20 MEQ tablet Take 40 mEq by mouth 4 (four) times daily.      . ranitidine (ZANTAC) 150 MG tablet Take 150 mg by mouth daily as needed for heartburn.     . Rivaroxaban (XARELTO) 20 MG TABS Take 20 mg by mouth every evening.     Marland Kitchen spironolactone (ALDACTONE) 50 MG tablet Take 50 mg by mouth 3 (three) times daily.       No current facility-administered medications for this visit.     Allergies as of 01/30/2018 - Review Complete 01/30/2018  Allergen Reaction Noted  . Levofloxacin Other (See Comments) 08/17/2011  . Tape Other (See Comments) 11/21/2016  . Hyoscyamine sulfate Other (See Comments)   . Metoclopramide Other (See Comments) 08/17/2011    Family History  Problem Relation Age of Onset  . Coronary artery disease Father        Died age 14  . Heart attack Father   . Arrhythmia Father        AF  . Diabetes Father   . Parkinson's disease Father   . Arrhythmia Mother        AF  . Stroke Mother   . Dementia Mother    . Cancer Mother        UTERINE   . Parkinson's disease Mother   . Arrhythmia Brother        AF  . Colon cancer Maternal Grandfather   . Colon cancer Paternal Aunt   . Colon cancer Paternal Aunt   . Diabetes Son   . Cancer Sister        BREAST   . Depression Sister   . Depression Sister   . Depression Sister     Social History   Socioeconomic History  . Marital status: Divorced    Spouse name: Not on file  . Number of children: Not on file  . Years of education: Not on file  . Highest education level: Not on file  Occupational History  . Not on file  Social Needs  . Financial resource strain: Not on file  . Food insecurity:    Worry: Not on file    Inability: Not on file  . Transportation needs:    Medical: Not on file    Non-medical: Not on file  Tobacco Use  . Smoking status: Former Smoker    Packs/day: 1.50    Years: 30.00    Pack years: 45.00    Types: Cigarettes    Start date: 03/06/1977    Last attempt to quit: 01/03/2007    Years since quitting: 11.0  . Smokeless tobacco: Never Used  Substance and Sexual Activity  . Alcohol use: No    Alcohol/week: 0.0 standard drinks  . Drug use: No  . Sexual activity: Never    Birth control/protection: Surgical  Lifestyle  . Physical activity:    Days per week: Not on file    Minutes per session: Not on file  . Stress: Not on file  Relationships  . Social connections:    Talks on phone: Not on file    Gets together: Not on file  Attends religious service: Not on file    Active member of club or organization: Not on file    Attends meetings of clubs or organizations: Not on file    Relationship status: Not on file  Other Topics Concern  . Not on file  Social History Narrative  . Not on file    Review of Systems: Gen: Denies fever, chills, anorexia. Denies fatigue, weakness, weight loss.  CV: Denies chest pain, palpitations, syncope, peripheral edema, and claudication. Resp: Denies dyspnea at rest, cough,  wheezing, coughing up blood, and pleurisy. GI: see HPI Derm: Denies rash, itching, dry skin Psych: Denies depression, anxiety, memory loss, confusion. No homicidal or suicidal ideation.  Heme: Denies bruising, bleeding, and enlarged lymph nodes.  Physical Exam: BP 124/60   Pulse 68   Temp (!) 97 F (36.1 C) (Oral)   Ht 5\' 9"  (1.753 m)   Wt (!) 324 lb 12.8 oz (147.3 kg)   BMI 47.96 kg/m  General:   Alert and oriented. No distress noted. Pleasant and cooperative.  Head:  Normocephalic and atraumatic. Eyes:  Conjuctiva clear without scleral icterus. Mouth:  Oral mucosa pink and moist.  Cardiac: S1 S2 present, regular, 3/6 systolic murmur  Abdomen:  +BS, soft, non-tender and non-distended. No rebound or guarding. No HSM or masses noted. Msk:  Symmetrical without gross deformities. Normal posture Neurologic:  Alert and  oriented x4 Psych:  Alert and cooperative. Normal mood and affect.

## 2018-01-31 DIAGNOSIS — L84 Corns and callosities: Secondary | ICD-10-CM | POA: Diagnosis not present

## 2018-01-31 DIAGNOSIS — B351 Tinea unguium: Secondary | ICD-10-CM | POA: Diagnosis not present

## 2018-01-31 DIAGNOSIS — E1142 Type 2 diabetes mellitus with diabetic polyneuropathy: Secondary | ICD-10-CM | POA: Diagnosis not present

## 2018-01-31 DIAGNOSIS — M79676 Pain in unspecified toe(s): Secondary | ICD-10-CM | POA: Diagnosis not present

## 2018-02-05 DIAGNOSIS — E1142 Type 2 diabetes mellitus with diabetic polyneuropathy: Secondary | ICD-10-CM | POA: Diagnosis not present

## 2018-02-05 DIAGNOSIS — M48062 Spinal stenosis, lumbar region with neurogenic claudication: Secondary | ICD-10-CM | POA: Diagnosis not present

## 2018-02-05 DIAGNOSIS — G894 Chronic pain syndrome: Secondary | ICD-10-CM | POA: Diagnosis not present

## 2018-02-05 DIAGNOSIS — Z79891 Long term (current) use of opiate analgesic: Secondary | ICD-10-CM | POA: Diagnosis not present

## 2018-02-06 ENCOUNTER — Other Ambulatory Visit: Payer: Self-pay | Admitting: Gastroenterology

## 2018-02-06 ENCOUNTER — Other Ambulatory Visit: Payer: Self-pay | Admitting: Nurse Practitioner

## 2018-02-06 DIAGNOSIS — I10 Essential (primary) hypertension: Secondary | ICD-10-CM | POA: Diagnosis not present

## 2018-02-06 DIAGNOSIS — I2510 Atherosclerotic heart disease of native coronary artery without angina pectoris: Secondary | ICD-10-CM | POA: Diagnosis not present

## 2018-02-06 DIAGNOSIS — K5900 Constipation, unspecified: Secondary | ICD-10-CM

## 2018-02-06 DIAGNOSIS — E119 Type 2 diabetes mellitus without complications: Secondary | ICD-10-CM | POA: Diagnosis not present

## 2018-02-21 ENCOUNTER — Ambulatory Visit: Payer: Self-pay | Admitting: Vascular Surgery

## 2018-02-28 ENCOUNTER — Ambulatory Visit (INDEPENDENT_AMBULATORY_CARE_PROVIDER_SITE_OTHER): Payer: Medicare Other | Admitting: Vascular Surgery

## 2018-02-28 ENCOUNTER — Other Ambulatory Visit: Payer: Self-pay

## 2018-02-28 ENCOUNTER — Encounter: Payer: Self-pay | Admitting: Vascular Surgery

## 2018-02-28 VITALS — BP 117/68 | HR 70 | Temp 97.1°F | Resp 16 | Ht 69.0 in | Wt 323.0 lb

## 2018-02-28 DIAGNOSIS — I83893 Varicose veins of bilateral lower extremities with other complications: Secondary | ICD-10-CM

## 2018-02-28 NOTE — Progress Notes (Signed)
Patient name: [name] [name] MRN: [id number] DOB: 19-Apr-1960 Sex: female  REASON FOR VISIT:   Follow-up of painful varicose veins bilaterally.  HPI:   [name] [name] is a pleasant 58 y.o. female who was seen with painful varicose veins bilaterally by Dr. Donzetta Matters on 09/14/2017.  She has had bilateral lower extremity swelling which is been gradually progressive.  She has no history of DVT.  She has been wearing her thigh-high compression stockings, elevating her legs, and would take the ibuprofen as needed for pain.  She experiences significant pain in both lower extremities but more significantly on the left side.  The swelling is aggravated by standing.  The swelling is relieved somewhat with elevation.  In addition she describes aching pain and heaviness in both lower extremities which is also aggravated by sitting and standing and relieved with elevation.  The symptoms are also more significant on the left leg.  Of note she was told that she had lymphedema 16 years ago.  She states that she has had varicose veins for over 20 years.  She is unaware of any previous history of DVT or phlebitis.  She has had no previous venous procedures.  She does have significant back issues.  Past Medical History:  Diagnosis Date  . Bladder dysfunction    Self urinary catheterization  . COPD (chronic obstructive pulmonary disease) ([place])   . DDD (degenerative disc disease)   . Depression   . Esophageal spasm    NTG and Norvasc  . Essential hypertension, benign   . Fibromyalgia   . Gastroparesis   . GERD (gastroesophageal reflux disease)   . History of cardiac catheterization    Normal coronary arteries 11/12  . History of pituitary tumor   . Irritable bowel syndrome   . Lymphedema   . Osteoarthritis   . PAF (paroxysmal atrial fibrillation) ([place])   . Sleep apnea    BIPAP  . Type 2 diabetes mellitus ([place])   . Urinary tract infection     Family History  Problem Relation Age of Onset  .  Coronary artery disease Father        Died age 76  . Heart attack Father   . Arrhythmia Father        AF  . Diabetes Father   . Parkinson's disease Father   . Arrhythmia Mother        AF  . Stroke Mother   . Dementia Mother   . Cancer Mother        UTERINE   . Parkinson's disease Mother   . Arrhythmia Brother        AF  . Colon cancer Maternal Grandfather   . Colon cancer Paternal Aunt   . Colon cancer Paternal Aunt   . Diabetes Son   . Cancer Sister        BREAST   . Depression Sister   . Depression Sister   . Depression Sister     SOCIAL HISTORY: Social History   Tobacco Use  . Smoking status: Former Smoker    Packs/day: 1.50    Years: 30.00    Pack years: 45.00    Types: Cigarettes    Start date: 03/06/1977    Last attempt to quit: 01/03/2007    Years since quitting: 11.1  . Smokeless tobacco: Never Used  Substance Use Topics  . Alcohol use: No    Alcohol/week: 0.0 standard drinks    Allergies  Allergen Reactions  . Levofloxacin Other (See  Comments)  . Tape Other (See Comments)    Welps, bad place on skin. Paper tape is okay  . Hyoscyamine Sulfate Other (See Comments)    TACHYCARDIA  . Metoclopramide Other (See Comments)    tremors    Current Outpatient Medications  Medication Sig Dispense Refill  . albuterol (PROAIR HFA) 108 (90 BASE) MCG/ACT inhaler Inhale 2 puffs into the lungs every 6 (six) hours as needed for wheezing or shortness of breath.     Marland Kitchen albuterol (PROVENTIL) (2.5 MG/3ML) 0.083% nebulizer solution Take 2.5 mg by nebulization every 6 (six) hours as needed for wheezing. For shortness of breath.    Marland Kitchen atorvastatin (LIPITOR) 10 MG tablet Take 10 mg by mouth at bedtime.  3  . baclofen (LIORESAL) 20 MG tablet Take 20 mg by mouth 3 (three) times daily.      Marland Kitchen buPROPion (WELLBUTRIN) 75 MG tablet Take 1 tablet (75 mg total) by mouth daily. 30 tablet 3  . CONSTULOSE 10 GM/15ML solution TAKE 15MLS BY MOUTH DAILY 473 mL 5  . diclofenac sodium  (VOLTAREN) 1 % GEL Apply 2-4 g topically 4 (four) times daily as needed (for pain).     Marland Kitchen diltiazem (CARDIZEM CD) 120 MG 24 hr capsule TAKE ONE CAPSULE BY MOUTH EVERY DAY 90 capsule 3  . docusate sodium (COLACE) 100 MG capsule Take 100 mg by mouth 2 (two) times daily.    . flecainide (TAMBOCOR) 50 MG tablet TAKE TWO TABLETS BY MOUTH EVERY MORNING and TAKE TWO TABLETS BY MOUTH EVERY EVENING 120 tablet 3  . fluticasone (FLONASE) 50 MCG/ACT nasal spray Place 2 sprays into both nostrils daily.  6  . furosemide (LASIX) 80 MG tablet Take 80 mg by mouth 2 (two) times daily.      Marland Kitchen gabapentin (NEURONTIN) 800 MG tablet Take 1 tablet by mouth 4 (four) times daily.  2  . glimepiride (AMARYL) 2 MG tablet Take 4 mg by mouth 2 (two) times daily.     . hydrocortisone (ANUSOL-HC) 2.5 % rectal cream Place 1 application rectally 2 (two) times daily. 30 g 1  . Insulin Degludec (TRESIBA [place]) Inject 44 Units into the skin at bedtime.     . isosorbide mononitrate (IMDUR) 120 MG 24 hr tablet Take 120 mg by mouth every morning.     . isosorbide mononitrate (IMDUR) 30 MG 24 hr tablet Take 30 mg by mouth every evening.     Marland Kitchen LINZESS 290 MCG CAPS capsule TAKE ONE CAPSULE BY MOUTH EVERY DAY BEFORE BREAKFAST - REPLACES MOVANTIK 30 capsule 11  . lisinopril (PRINIVIL,ZESTRIL) 10 MG tablet Take 10 mg by mouth every evening.     . loratadine (CLARITIN) 10 MG tablet Take 10 mg by mouth daily.      . meclizine (ANTIVERT) 25 MG tablet Take 25 mg by mouth 3 (three) times daily as needed for dizziness.     . metFORMIN (GLUCOPHAGE) 500 MG tablet Take 1,000 mg by mouth 2 (two) times daily.     . Multiple Vitamin (MULTIVITAMIN WITH MINERALS) TABS tablet Take 1 tablet by mouth daily.    Marland Kitchen NEXIUM 40 MG capsule TAKE ONE CAPSULE BY MOUTH EVERY DAY BEFORE BREAKFAST (seven IN THE MORNING PER pt) 90 capsule 3  . nitroGLYCERIN (NITROLINGUAL) 0.4 MG/SPRAY spray USE 1 SPRAY UNDER THE TONGUE AS NEEDED FOR CHEST PAIN 12 g 3  . NUCYNTA ER 200 MG TB12  Take 200 mg by mouth every 12 (twelve) hours.     Marland Kitchen nystatin (  MYCOSTATIN/NYSTOP) 100000 UNIT/GM POWD Apply 1 g topically daily.     Marland Kitchen omega-3 acid ethyl esters (LOVAZA) 1 g capsule Take 1 g by mouth 4 (four) times daily.     . ondansetron (ZOFRAN) 4 MG tablet TAKE 4 MG BY MOUTH EVERY 8 HOURS AS NEEDED FOR NAUSEA OR VOMITING 30 tablet 3  . oxyCODONE-acetaminophen (PERCOCET) 10-325 MG per tablet Take 1 tablet by mouth every 6 (six) hours as needed for pain.     . polyethylene glycol powder (GLYCOLAX/MIRALAX) powder Take 17 g by mouth daily.     . potassium chloride SA (K-DUR,KLOR-CON) 20 MEQ tablet Take 40 mEq by mouth 4 (four) times daily.      . ranitidine (ZANTAC) 150 MG tablet Take 150 mg by mouth daily as needed for heartburn.     . Rivaroxaban (XARELTO) 20 MG TABS Take 20 mg by mouth every evening.     Marland Kitchen spironolactone (ALDACTONE) 50 MG tablet Take 50 mg by mouth 3 (three) times daily.       No current facility-administered medications for this visit.     REVIEW OF SYSTEMS:  [X]  denotes positive finding, [ ]  denotes negative finding Cardiac  Comments:  Chest pain or chest pressure: x   Shortness of breath upon exertion: x   Short of breath when lying flat: x   Irregular heart rhythm: x       Vascular    Pain in calf, thigh, or hip brought on by ambulation: x   Pain in feet at night that wakes you up from your sleep:  x   Blood clot in your veins:    Leg swelling:  x       Pulmonary    Oxygen at home: x   Productive cough:     Wheezing:         Neurologic    Sudden weakness in arms or legs:  x   Sudden numbness in arms or legs:  x   Sudden onset of difficulty speaking or slurred speech:    Temporary loss of vision in one eye:     Problems with dizziness:  x       Gastrointestinal    Blood in stool:     Vomited blood:         Genitourinary    Burning when urinating:     Blood in urine:        Psychiatric    Major depression:         Hematologic    Bleeding  problems:    Problems with blood clotting too easily:        Skin    Rashes or ulcers:        Constitutional    Fever or chills:     PHYSICAL EXAM:   Vitals:   02/28/18 1525  BP: 117/68  Pulse: 70  Resp: 16  Temp: (!) 97.1 F (36.2 C)  TempSrc: Oral  SpO2: 94%  Weight: (!) 323 lb (146.5 kg)  Height: 5\' 9"  (1.753 m)    Body mass index is 47.7 kg/m.  GENERAL: The patient is a well-nourished female, in no acute distress. The vital signs are documented above. CARDIAC: There is a regular rate and rhythm.  VASCULAR: I do not detect carotid bruits. She has palpable dorsalis pedis and posterior tibial pulses bilaterally. VENOUS EXAM: She has spider veins in both thighs and legs.  She has a small cluster of varicose veins in her medial right  calf.  She has bilateral lower extremity swelling which is more significant on the left side. I did look at her left great saphenous vein with the SonoSite.  The vein is significantly dilated with reflux from the saphenofemoral junction down to the proximal calf.  Vein becomes more superficial below the knee and I would likely cannulate the vein just above the knee or at the knee. PULMONARY: There is good air exchange bilaterally without wheezing or rales. ABDOMEN: Soft and non-tender with normal pitched bowel sounds.  MUSCULOSKELETAL: There are no major deformities or cyanosis. NEUROLOGIC: No focal weakness or paresthesias are detected. SKIN: There are no ulcers or rashes noted. PSYCHIATRIC: The patient has a normal affect.  DATA:    VENOUS DUPLEX: I have reviewed her venous duplex scan that was done on 09/14/2017.  On the left side there is no evidence of DVT or superficial thrombophlebitis.  There is no significant deep venous reflux.  There is reflux in the left great saphenous vein from the saphenofemoral junction to the proximal calf.  The vein is significantly dilated with diameters ranging from 0.69-0.89 cm.  There is no reflux in the  small saphenous vein.  On the right side, there is no evidence of DVT or superficial thrombophlebitis.  There is no significant deep venous reflux.  There is reflux in the right small saphenous vein and also the distal thigh great saphenous vein.  MEDICAL ISSUES:   CHRONIC VENOUS INSUFFICIENCY: This patient has chronic venous insufficiency bilaterally with reflux in the right small saphenous vein in some segments of the great saphenous vein.  On the left side the entire great saphenous vein has reflux and is significantly dilated.  She has moderate swelling bilaterally more significantly on the left side.  She has CEAP C3 venous disease.  We have discussed the importance of intermittent leg elevation the proper positioning for this.  In addition I have encouraged her to avoid prolonged sitting and standing.  We discussed continued use of her compression stockings which she has been wearing.  We discussed the importance of exercise, specifically walking and water aerobics.  In addition I discussed the importance of weight management given that central obesity especially increases venous pressure.  She feels that her symptoms are significantly disabling and would like to consider laser ablation of the left great saphenous vein.  Based on her vein map I think she is a reasonable candidate for this.  Given her weight certainly this will be more technically challenging especially visualization of the saphenofemoral junction given she has a large pannus.  I have discussed the indications for endovenous laser ablation of the left GSV, that is to lower the pressure in the veins and potentially help relieve the symptoms from venous hypertension. I have also discussed alternative options including conservative treatment with leg elevation, compression therapy, exercise, avoiding prolonged sitting and standing, and weight management. I have discussed the potential complications of the procedure, including, but not  limited to: bleeding, bruising, leg swelling, nerve injury, skin burns, significant pain from phlebitis, deep venous thrombosis, or failure of the vein to close.  I have explained to her that given her obesity she is at slightly increased risk for complications.  I have also explained that venous insufficiency is a chronic disease, and that the patient is at risk for recurrent varicose veins in the future.  All of the patient's questions were encouraged and answered. They are agreeable to proceed.   Deitra Mayo [facility]  [employer] [phone]

## 2018-03-01 ENCOUNTER — Other Ambulatory Visit: Payer: Self-pay | Admitting: *Deleted

## 2018-03-01 DIAGNOSIS — I83811 Varicose veins of right lower extremities with pain: Secondary | ICD-10-CM

## 2018-03-04 DIAGNOSIS — Z299 Encounter for prophylactic measures, unspecified: Secondary | ICD-10-CM | POA: Diagnosis not present

## 2018-03-04 DIAGNOSIS — E1142 Type 2 diabetes mellitus with diabetic polyneuropathy: Secondary | ICD-10-CM | POA: Diagnosis not present

## 2018-03-04 DIAGNOSIS — Z6841 Body Mass Index (BMI) 40.0 and over, adult: Secondary | ICD-10-CM | POA: Diagnosis not present

## 2018-03-04 DIAGNOSIS — E1165 Type 2 diabetes mellitus with hyperglycemia: Secondary | ICD-10-CM | POA: Diagnosis not present

## 2018-03-04 DIAGNOSIS — I8390 Asymptomatic varicose veins of unspecified lower extremity: Secondary | ICD-10-CM | POA: Diagnosis not present

## 2018-03-05 DIAGNOSIS — E1142 Type 2 diabetes mellitus with diabetic polyneuropathy: Secondary | ICD-10-CM | POA: Diagnosis not present

## 2018-03-05 DIAGNOSIS — Z79891 Long term (current) use of opiate analgesic: Secondary | ICD-10-CM | POA: Diagnosis not present

## 2018-03-05 DIAGNOSIS — G894 Chronic pain syndrome: Secondary | ICD-10-CM | POA: Diagnosis not present

## 2018-03-05 DIAGNOSIS — M48061 Spinal stenosis, lumbar region without neurogenic claudication: Secondary | ICD-10-CM | POA: Diagnosis not present

## 2018-03-07 ENCOUNTER — Encounter: Payer: Self-pay | Admitting: Vascular Surgery

## 2018-03-07 ENCOUNTER — Ambulatory Visit (INDEPENDENT_AMBULATORY_CARE_PROVIDER_SITE_OTHER): Payer: Medicare Other | Admitting: Vascular Surgery

## 2018-03-07 ENCOUNTER — Other Ambulatory Visit: Payer: Self-pay

## 2018-03-07 VITALS — Ht 69.0 in | Wt 323.0 lb

## 2018-03-07 DIAGNOSIS — I83893 Varicose veins of bilateral lower extremities with other complications: Secondary | ICD-10-CM

## 2018-03-07 HISTORY — PX: ENDOVENOUS ABLATION SAPHENOUS VEIN W/ LASER: SUR449

## 2018-03-07 NOTE — Progress Notes (Signed)
     Laser Ablation Procedure    Date: 03/07/2018   [name] [name] DOB:1960/05/05  Consent signed: Yes    Surgeon:  Dr. Deitra Mayo  Procedure: Laser Ablation: left Greater Saphenous Vein  Ht 5\' 9"  (1.753 m)   Wt (!) 323 lb (146.5 kg)   BMI 47.70 kg/m   Tumescent Anesthesia: 400 cc 0.9% NaCl with 50 cc Lidocaine HCL 1% and 15 cc 8.4% NaHCO3  Local Anesthesia: 5 cc Lidocaine HCL and NaHCO3 (ratio 2:1)  15 watts continuous mode        Total energy: 2569 Joules    Total time: 2:51    Patient tolerated procedure well  Notes: Last dose of Xarelto taken 03-04-2018.  Description of Procedure:  After marking the course of the secondary varicosities, the patient was placed on the operating table in the supine position, and the left leg was prepped and draped in sterile fashion.   Local anesthetic was administered and under ultrasound guidance the saphenous vein was accessed with a micro needle and guide wire; then the mirco puncture sheath was placed.  A guide wire was inserted saphenofemoral junction , followed by a 5 french sheath.  The position of the sheath and then the laser fiber below the junction was confirmed using the ultrasound.  Tumescent anesthesia was administered along the course of the saphenous vein using ultrasound guidance. The patient was placed in Trendelenburg position and protective laser glasses were placed on patient and staff, and the laser was fired at 15 watts continuous mode advancing 1-73mm/second for a total of 2569 joules.     Steri strip was applied to the IV insertion and ABD pads and thigh high compression stockings were applied.  Ace wrap bandages were applied over the left thigh and at the top of the saphenofemoral junction. Blood loss was less than 15 cc.  The patient ambulated out of the operating room having tolerated the procedure well.

## 2018-03-07 NOTE — Progress Notes (Signed)
Patient name: [name] [name] MRN: [id number] DOB: 01-Jan-1961 Sex: female  REASON FOR VISIT:   For endovenous laser ablation of the left great saphenous vein.  HPI:   [name] [name] is a pleasant 58 y.o. female who I last saw on 02/28/2018.  She has painful varicose veins bilaterally.  She is failed conservative treatment.  On my exam she had a small cluster of varicose veins in her medial right calf.  I looked at the left great saphenous vein with the SonoSite.  The vein was significantly dilated with reflux from the saphenofemoral junction down to the proximal calf.  The vein becomes superficial below the knee and I felt I would likely cannulate the vein just above the knee.  For formal reflux study showed no evidence of DVT on the left or superficial thrombophlebitis.  There was reflux from the saphenofemoral junction to the proximal calf and the vein diameters range from 0.69-0.89 cm.  There was no reflux in the small saphenous vein.  Current Outpatient Medications  Medication Sig Dispense Refill  . albuterol (PROAIR HFA) 108 (90 BASE) MCG/ACT inhaler Inhale 2 puffs into the lungs every 6 (six) hours as needed for wheezing or shortness of breath.     Marland Kitchen albuterol (PROVENTIL) (2.5 MG/3ML) 0.083% nebulizer solution Take 2.5 mg by nebulization every 6 (six) hours as needed for wheezing. For shortness of breath.    Marland Kitchen atorvastatin (LIPITOR) 10 MG tablet Take 10 mg by mouth at bedtime.  3  . baclofen (LIORESAL) 20 MG tablet Take 20 mg by mouth 3 (three) times daily.      Marland Kitchen buPROPion (WELLBUTRIN) 75 MG tablet Take 1 tablet (75 mg total) by mouth daily. 30 tablet 3  . CONSTULOSE 10 GM/15ML solution TAKE 15MLS BY MOUTH DAILY 473 mL 5  . diclofenac sodium (VOLTAREN) 1 % GEL Apply 2-4 g topically 4 (four) times daily as needed (for pain).     Marland Kitchen diltiazem (CARDIZEM CD) 120 MG 24 hr capsule TAKE ONE CAPSULE BY MOUTH EVERY DAY 90 capsule 3  . docusate sodium (COLACE) 100 MG capsule Take 100 mg by mouth 2  (two) times daily.    . flecainide (TAMBOCOR) 50 MG tablet TAKE TWO TABLETS BY MOUTH EVERY MORNING and TAKE TWO TABLETS BY MOUTH EVERY EVENING 120 tablet 3  . fluticasone (FLONASE) 50 MCG/ACT nasal spray Place 2 sprays into both nostrils daily.  6  . furosemide (LASIX) 80 MG tablet Take 80 mg by mouth 2 (two) times daily.      Marland Kitchen gabapentin (NEURONTIN) 800 MG tablet Take 1 tablet by mouth 4 (four) times daily.  2  . glimepiride (AMARYL) 2 MG tablet Take 4 mg by mouth 2 (two) times daily.     . hydrocortisone (ANUSOL-HC) 2.5 % rectal cream Place 1 application rectally 2 (two) times daily. 30 g 1  . Insulin Degludec (TRESIBA [place]) Inject 44 Units into the skin at bedtime.     . isosorbide mononitrate (IMDUR) 120 MG 24 hr tablet Take 120 mg by mouth every morning.     . isosorbide mononitrate (IMDUR) 30 MG 24 hr tablet Take 30 mg by mouth every evening.     Marland Kitchen LINZESS 290 MCG CAPS capsule TAKE ONE CAPSULE BY MOUTH EVERY DAY BEFORE BREAKFAST - REPLACES MOVANTIK 30 capsule 11  . lisinopril (PRINIVIL,ZESTRIL) 10 MG tablet Take 10 mg by mouth every evening.     . loratadine (CLARITIN) 10 MG tablet Take 10 mg by mouth daily.      Marland Kitchen  meclizine (ANTIVERT) 25 MG tablet Take 25 mg by mouth 3 (three) times daily as needed for dizziness.     . metFORMIN (GLUCOPHAGE) 500 MG tablet Take 1,000 mg by mouth 2 (two) times daily.     . Multiple Vitamin (MULTIVITAMIN WITH MINERALS) TABS tablet Take 1 tablet by mouth daily.    Marland Kitchen NEXIUM 40 MG capsule TAKE ONE CAPSULE BY MOUTH EVERY DAY BEFORE BREAKFAST (seven IN THE MORNING PER pt) 90 capsule 3  . nitroGLYCERIN (NITROLINGUAL) 0.4 MG/SPRAY spray USE 1 SPRAY UNDER THE TONGUE AS NEEDED FOR CHEST PAIN 12 g 3  . NUCYNTA ER 200 MG TB12 Take 200 mg by mouth every 12 (twelve) hours.     Marland Kitchen nystatin (MYCOSTATIN/NYSTOP) 100000 UNIT/GM POWD Apply 1 g topically daily.     Marland Kitchen omega-3 acid ethyl esters (LOVAZA) 1 g capsule Take 1 g by mouth 4 (four) times daily.     . ondansetron (ZOFRAN)  4 MG tablet TAKE 4 MG BY MOUTH EVERY 8 HOURS AS NEEDED FOR NAUSEA OR VOMITING 30 tablet 3  . oxyCODONE-acetaminophen (PERCOCET) 10-325 MG per tablet Take 1 tablet by mouth every 6 (six) hours as needed for pain.     . polyethylene glycol powder (GLYCOLAX/MIRALAX) powder Take 17 g by mouth daily.     . potassium chloride SA (K-DUR,KLOR-CON) 20 MEQ tablet Take 40 mEq by mouth 4 (four) times daily.      . ranitidine (ZANTAC) 150 MG tablet Take 150 mg by mouth daily as needed for heartburn.     . spironolactone (ALDACTONE) 50 MG tablet Take 50 mg by mouth 3 (three) times daily.      . Rivaroxaban (XARELTO) 20 MG TABS Take 20 mg by mouth every evening.      No current facility-administered medications for this visit.     REVIEW OF SYSTEMS:  [X]  denotes positive finding, [ ]  denotes negative finding Vascular    Leg swelling    Cardiac    Chest pain or chest pressure:    Shortness of breath upon exertion:    Short of breath when lying flat:    Irregular heart rhythm:    Constitutional    Fever or chills:     PHYSICAL EXAM:   Vitals:   03/07/18 0849  Weight: (!) 323 lb (146.5 kg)  Height: 5\' 9"  (1.753 m)    GENERAL: The patient is a well-nourished female, in no acute distress. The vital signs are documented above.  DATA:   No new data  MEDICAL ISSUES:   LASER ABLATION LEFT GREAT SAPHENOUS VEIN: The patient was taken to the exam room and placed supine.  I examined the left great saphenous vein with the SonoSite.  I felt it would be best to cannulate the vein just above the knee.  The left lower extremity was prepped and draped in usual sterile fashion.  Under ultrasound guidance, after the skin was anesthetized, I cannulated the great saphenous vein just above the knee with a micropuncture needle and a micropuncture sheath was introduced over the wire.  I then advanced the J-wire to well below the saphenofemoral junction.  This was done under guidance of the SonoSite.  The sheath was  then advanced to the end of the wire and then the dilator removed.  I then placed the laser fiber to the end of the sheath.  This was carefully positioned well below the saphenofemoral junction.  This was distal to the takeoff of the superficial epigastric vein.  The sheath was slightly retracted to expose the fiber.  Tumescent anesthesia was then administered the entire length of the left great saphenous vein.  The patient was then placed in Trendelenburg.  Laser precautions were taken and then laser ablation was performed of the left great saphenous vein from distal to the saphenofemoral junction to the just above the knee.  A total of 2569 J of energy were used.  Sterile dressing was applied.  Pressure dressing was applied.  The patient tolerated the procedure well.  She will will return in 1 week for follow-up duplex.  Deitra Mayo [facility] [phone]

## 2018-03-14 ENCOUNTER — Ambulatory Visit (HOSPITAL_COMMUNITY)
Admission: RE | Admit: 2018-03-14 | Discharge: 2018-03-14 | Disposition: A | Discharge status: Home / Self Care | Payer: Medicare Other | Source: Ambulatory Visit | Attending: Vascular Surgery | Admitting: Vascular Surgery

## 2018-03-14 ENCOUNTER — Ambulatory Visit (INDEPENDENT_AMBULATORY_CARE_PROVIDER_SITE_OTHER): Payer: Medicare Other | Admitting: Vascular Surgery

## 2018-03-14 ENCOUNTER — Encounter (HOSPITAL_COMMUNITY): Payer: Self-pay

## 2018-03-14 ENCOUNTER — Encounter: Payer: Self-pay | Admitting: Vascular Surgery

## 2018-03-14 ENCOUNTER — Other Ambulatory Visit: Payer: Self-pay

## 2018-03-14 VITALS — BP 111/61 | HR 63 | Temp 97.0°F | Resp 18 | Ht 69.0 in | Wt 323.0 lb

## 2018-03-14 DIAGNOSIS — I83812 Varicose veins of left lower extremities with pain: Secondary | ICD-10-CM

## 2018-03-14 DIAGNOSIS — I83811 Varicose veins of right lower extremities with pain: Secondary | ICD-10-CM | POA: Diagnosis not present

## 2018-03-14 NOTE — Progress Notes (Signed)
Patient name: [name] [name] MRN: [id number] DOB: [birth year] Sex: female  REASON FOR VISIT:   Follow-up after endovenous laser ablation of the left great saphenous vein  HPI:   [name] [name] is a pleasant 58 y.o. female who underwent laser ablation of the left great saphenous vein on 03/07/2018.  She returns for an outpatient visit.  She has no specific complaints.  She denies significant pain or swelling.  She has some moderate bruising which has improved.  She denies chest pain or shortness of breath.  She has been gradually resuming her normal activities.  She has been wearing her thigh-high stockings and also taking ibuprofen.  Current Outpatient Medications  Medication Sig Dispense Refill  . albuterol (PROAIR HFA) 108 (90 BASE) MCG/ACT inhaler Inhale 2 puffs into the lungs every 6 (six) hours as needed for wheezing or shortness of breath.     Marland Kitchen albuterol (PROVENTIL) (2.5 MG/3ML) 0.083% nebulizer solution Take 2.5 mg by nebulization every 6 (six) hours as needed for wheezing. For shortness of breath.    Marland Kitchen atorvastatin (LIPITOR) 10 MG tablet Take 10 mg by mouth at bedtime.  3  . baclofen (LIORESAL) 20 MG tablet Take 20 mg by mouth 3 (three) times daily.      Marland Kitchen buPROPion (WELLBUTRIN) 75 MG tablet Take 1 tablet (75 mg total) by mouth daily. 30 tablet 3  . CONSTULOSE 10 GM/15ML solution TAKE 15MLS BY MOUTH DAILY 473 mL 5  . diclofenac sodium (VOLTAREN) 1 % GEL Apply 2-4 g topically 4 (four) times daily as needed (for pain).     Marland Kitchen diltiazem (CARDIZEM CD) 120 MG 24 hr capsule TAKE ONE CAPSULE BY MOUTH EVERY DAY 90 capsule 3  . docusate sodium (COLACE) 100 MG capsule Take 100 mg by mouth 2 (two) times daily.    . flecainide (TAMBOCOR) 50 MG tablet TAKE TWO TABLETS BY MOUTH EVERY MORNING and TAKE TWO TABLETS BY MOUTH EVERY EVENING 120 tablet 3  . fluticasone (FLONASE) 50 MCG/ACT nasal spray Place 2 sprays into both nostrils daily.  6  . furosemide (LASIX) 80 MG tablet Take 80 mg by mouth 2  (two) times daily.      Marland Kitchen gabapentin (NEURONTIN) 800 MG tablet Take 1 tablet by mouth 4 (four) times daily.  2  . glimepiride (AMARYL) 2 MG tablet Take 4 mg by mouth 2 (two) times daily.     . hydrocortisone (ANUSOL-HC) 2.5 % rectal cream Place 1 application rectally 2 (two) times daily. 30 g 1  . Insulin Degludec (TRESIBA [place]) Inject 44 Units into the skin at bedtime.     . isosorbide mononitrate (IMDUR) 120 MG 24 hr tablet Take 120 mg by mouth every morning.     . isosorbide mononitrate (IMDUR) 30 MG 24 hr tablet Take 30 mg by mouth every evening.     Marland Kitchen LINZESS 290 MCG CAPS capsule TAKE ONE CAPSULE BY MOUTH EVERY DAY BEFORE BREAKFAST - REPLACES MOVANTIK 30 capsule 11  . lisinopril (PRINIVIL,ZESTRIL) 10 MG tablet Take 10 mg by mouth every evening.     . loratadine (CLARITIN) 10 MG tablet Take 10 mg by mouth daily.      . meclizine (ANTIVERT) 25 MG tablet Take 25 mg by mouth 3 (three) times daily as needed for dizziness.     . metFORMIN (GLUCOPHAGE) 500 MG tablet Take 1,000 mg by mouth 2 (two) times daily.     . Multiple Vitamin (MULTIVITAMIN WITH MINERALS) TABS tablet Take 1 tablet by mouth  daily.    Marland Kitchen NEXIUM 40 MG capsule TAKE ONE CAPSULE BY MOUTH EVERY DAY BEFORE BREAKFAST (seven IN THE MORNING PER pt) 90 capsule 3  . nitroGLYCERIN (NITROLINGUAL) 0.4 MG/SPRAY spray USE 1 SPRAY UNDER THE TONGUE AS NEEDED FOR CHEST PAIN 12 g 3  . NUCYNTA ER 200 MG TB12 Take 200 mg by mouth every 12 (twelve) hours.     Marland Kitchen nystatin (MYCOSTATIN/NYSTOP) 100000 UNIT/GM POWD Apply 1 g topically daily.     Marland Kitchen omega-3 acid ethyl esters (LOVAZA) 1 g capsule Take 1 g by mouth 4 (four) times daily.     . ondansetron (ZOFRAN) 4 MG tablet TAKE 4 MG BY MOUTH EVERY 8 HOURS AS NEEDED FOR NAUSEA OR VOMITING 30 tablet 3  . oxyCODONE-acetaminophen (PERCOCET) 10-325 MG per tablet Take 1 tablet by mouth every 6 (six) hours as needed for pain.     . polyethylene glycol powder (GLYCOLAX/MIRALAX) powder Take 17 g by mouth daily.     .  potassium chloride SA (K-DUR,KLOR-CON) 20 MEQ tablet Take 40 mEq by mouth 4 (four) times daily.      . ranitidine (ZANTAC) 150 MG tablet Take 150 mg by mouth daily as needed for heartburn.     . Rivaroxaban (XARELTO) 20 MG TABS Take 20 mg by mouth every evening.     Marland Kitchen spironolactone (ALDACTONE) 50 MG tablet Take 50 mg by mouth 3 (three) times daily.       No current facility-administered medications for this visit.     REVIEW OF SYSTEMS:  [X]  denotes positive finding, [ ]  denotes negative finding Vascular    Leg swelling    Cardiac    Chest pain or chest pressure:    Shortness of breath upon exertion:    Short of breath when lying flat:    Irregular heart rhythm:    Constitutional    Fever or chills:     PHYSICAL EXAM:   Vitals:   03/14/18 1352  BP: 111/61  Pulse: 63  Resp: 18  Temp: (!) 97 F (36.1 C)  TempSrc: Oral  SpO2: 96%  Weight: (!) 323 lb (146.5 kg)  Height: 5\' 9"  (1.753 m)    GENERAL: The patient is a well-nourished female, in no acute distress. The vital signs are documented above. CARDIOVASCULAR: There is a regular rate and rhythm. PULMONARY: There is good air exchange bilaterally without wheezing or rales. VASCULAR: She has mild bruising in the upper thigh.  She has no significant leg swelling.  She has spider veins bilaterally.  DATA:   VENOUS DUPLEX: I have independently interpreted her venous duplex scan today.  This shows no evidence of DVT.  The vein was successfully closed to within 8 cm of the saphenofemoral junction.  MEDICAL ISSUES:   STATUS POST LASER ABLATION LEFT GREAT SAPHENOUS VEIN: Patient is doing well status post laser ablation of the left great saphenous vein.  We have again discussed the importance of intermittent leg elevation, the use of compression stockings, avoidance of prolonged sitting and standing, and the importance of exercise and weight management.  She did have some reflux in the right small saphenous vein however the vein was  not especially dilated.  If her symptoms progress in the future then certainly we could restudy the right leg.  I will see her back as needed.  Deitra Mayo [facility] [phone]

## 2018-04-03 DIAGNOSIS — Z79891 Long term (current) use of opiate analgesic: Secondary | ICD-10-CM | POA: Diagnosis not present

## 2018-04-03 DIAGNOSIS — M48061 Spinal stenosis, lumbar region without neurogenic claudication: Secondary | ICD-10-CM | POA: Diagnosis not present

## 2018-04-03 DIAGNOSIS — G894 Chronic pain syndrome: Secondary | ICD-10-CM | POA: Diagnosis not present

## 2018-04-03 DIAGNOSIS — E1142 Type 2 diabetes mellitus with diabetic polyneuropathy: Secondary | ICD-10-CM | POA: Diagnosis not present

## 2018-04-05 DIAGNOSIS — I2510 Atherosclerotic heart disease of native coronary artery without angina pectoris: Secondary | ICD-10-CM | POA: Diagnosis not present

## 2018-04-05 DIAGNOSIS — I10 Essential (primary) hypertension: Secondary | ICD-10-CM | POA: Diagnosis not present

## 2018-04-05 DIAGNOSIS — E119 Type 2 diabetes mellitus without complications: Secondary | ICD-10-CM | POA: Diagnosis not present

## 2018-04-08 ENCOUNTER — Other Ambulatory Visit: Payer: Self-pay

## 2018-04-08 MED ORDER — FLECAINIDE ACETATE 50 MG PO TABS: ORAL_TABLET | ORAL | 6 refills | Status: DC

## 2018-04-08 NOTE — Telephone Encounter (Signed)
E-scribed flecainide refill to [facility]

## 2018-05-01 DIAGNOSIS — G894 Chronic pain syndrome: Secondary | ICD-10-CM | POA: Diagnosis not present

## 2018-05-01 DIAGNOSIS — Z79891 Long term (current) use of opiate analgesic: Secondary | ICD-10-CM | POA: Diagnosis not present

## 2018-05-01 DIAGNOSIS — M48061 Spinal stenosis, lumbar region without neurogenic claudication: Secondary | ICD-10-CM | POA: Diagnosis not present

## 2018-05-01 DIAGNOSIS — E1142 Type 2 diabetes mellitus with diabetic polyneuropathy: Secondary | ICD-10-CM | POA: Diagnosis not present

## 2018-05-03 DIAGNOSIS — I10 Essential (primary) hypertension: Secondary | ICD-10-CM | POA: Diagnosis not present

## 2018-05-03 DIAGNOSIS — E119 Type 2 diabetes mellitus without complications: Secondary | ICD-10-CM | POA: Diagnosis not present

## 2018-05-03 DIAGNOSIS — I2510 Atherosclerotic heart disease of native coronary artery without angina pectoris: Secondary | ICD-10-CM | POA: Diagnosis not present

## 2018-05-08 ENCOUNTER — Other Ambulatory Visit: Payer: Self-pay | Admitting: Gastroenterology

## 2018-05-17 IMAGING — DX DG ABDOMEN 2V
3 series · 3 of 3 positions shown · non-contrast
Comparison: CT from 12/29/2014

CLINICAL DATA: Constipation for 6 months.  Recent diarrhea.

EXAM:
ABDOMEN - 2 VIEW

[abdomen erect]
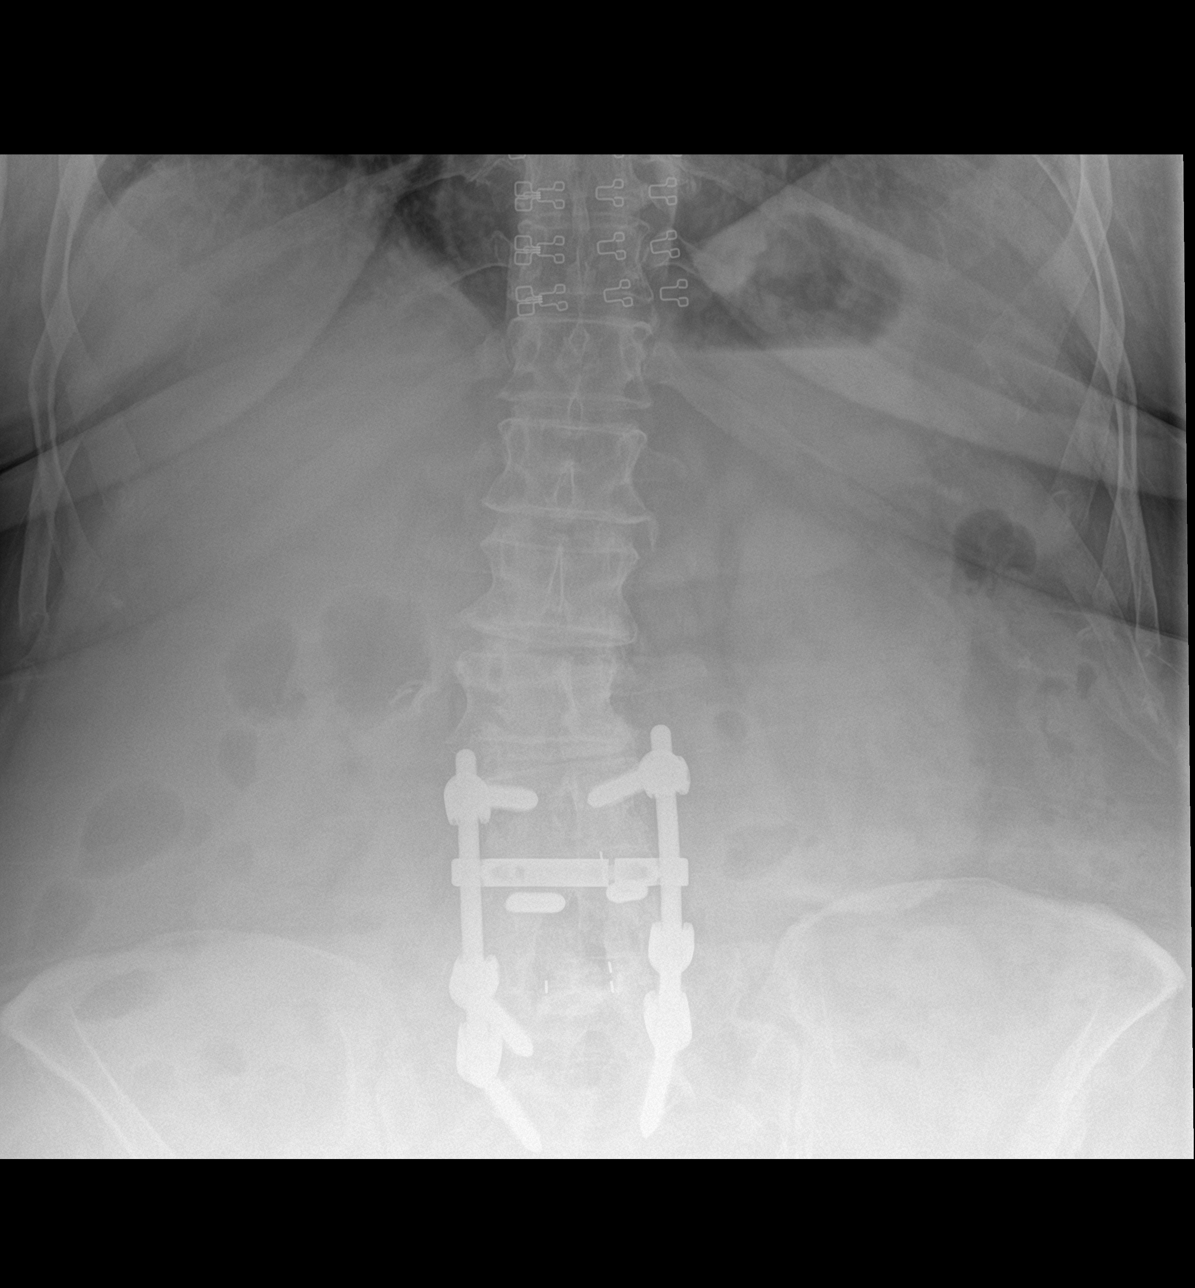

[abdomen supine (1 of 2)]
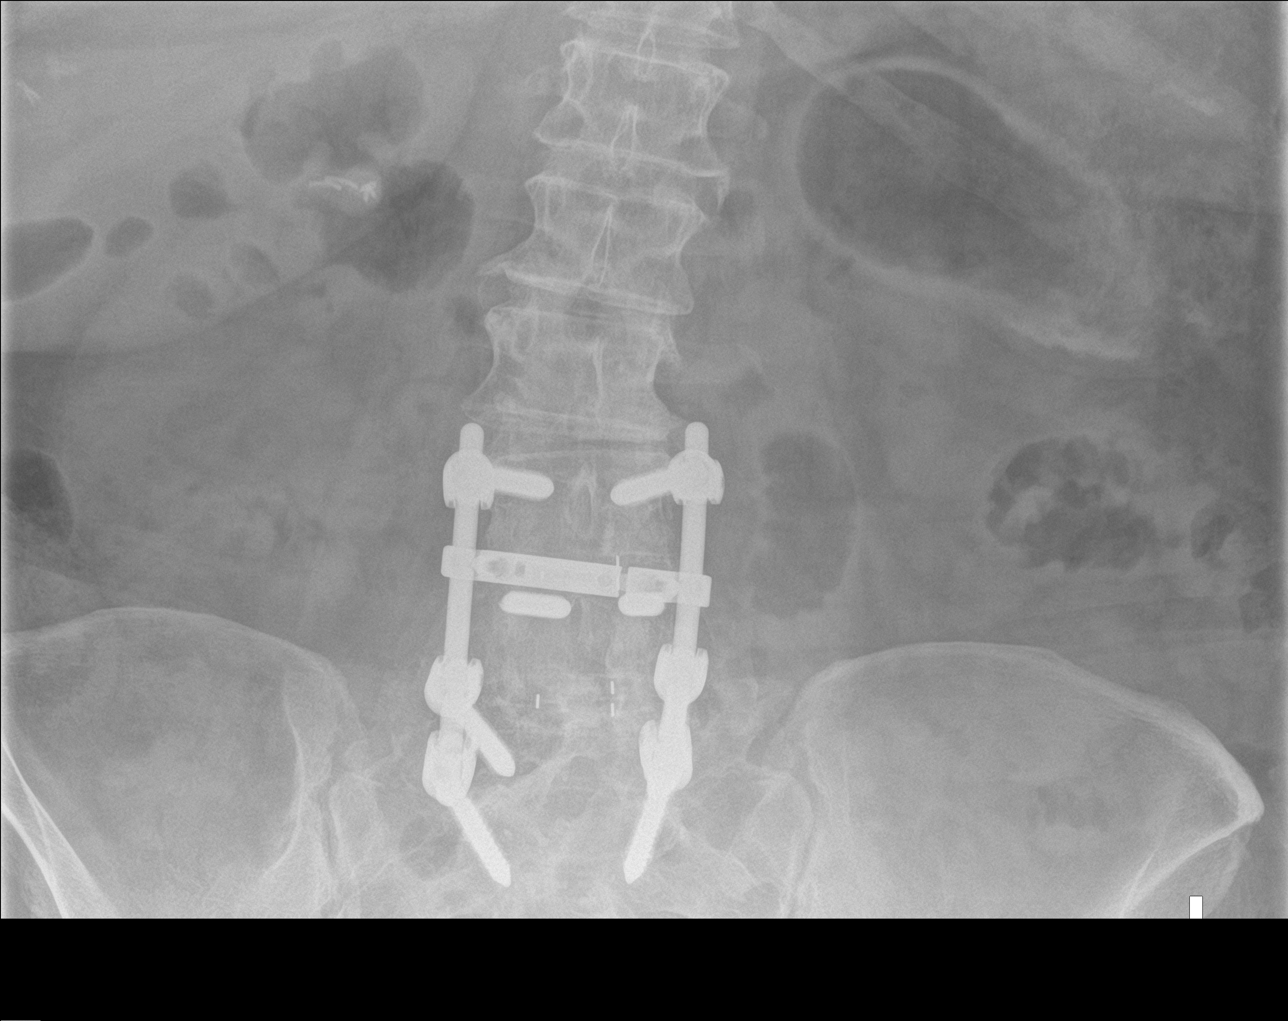

[abdomen supine (2 of 2)]
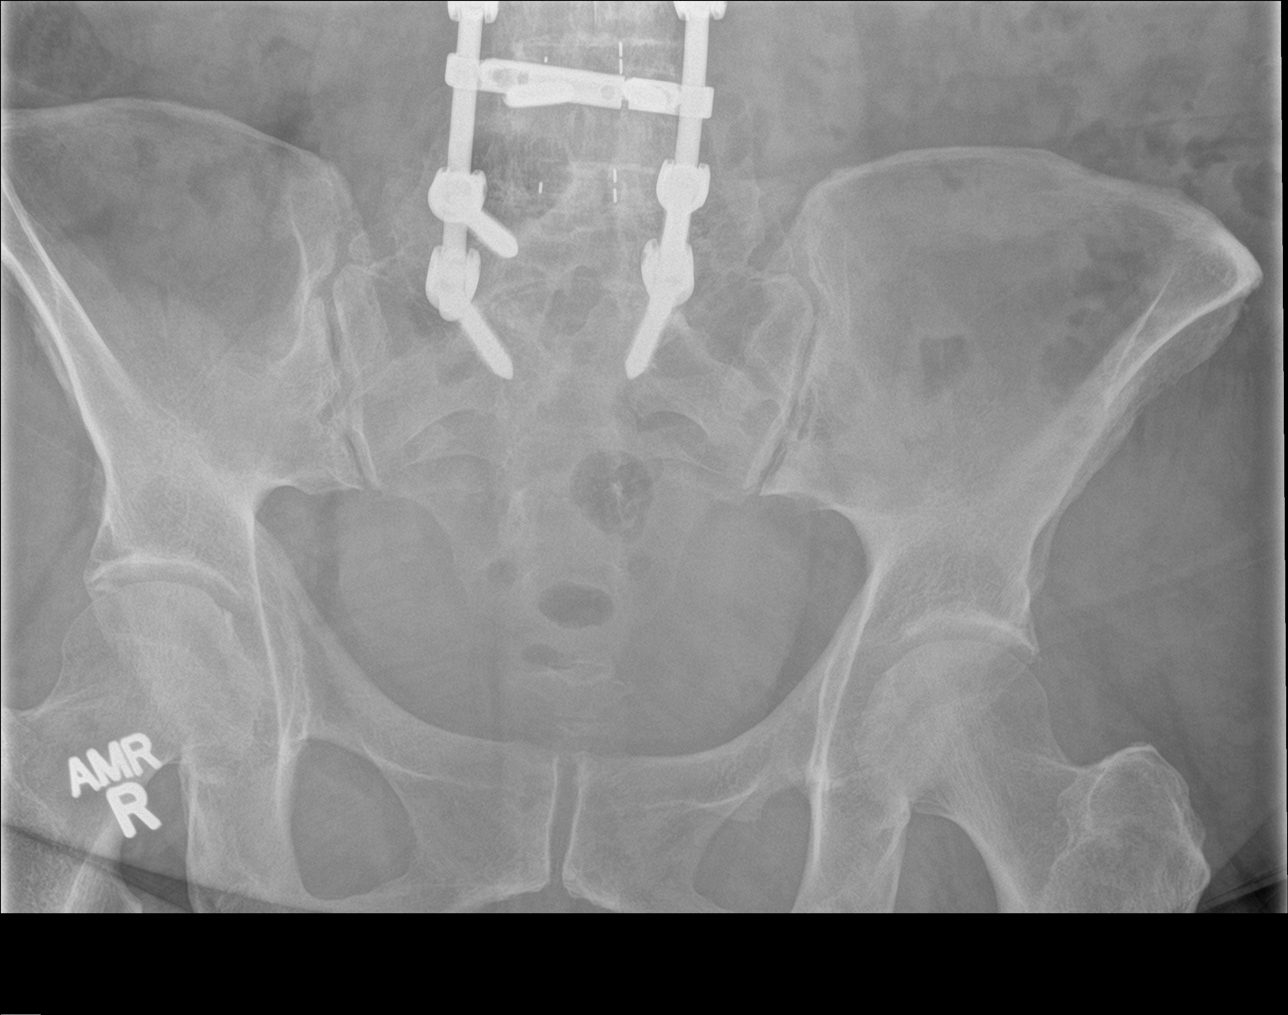

[3 of 3 positions shown; findings below may reference images not displayed]

FINDINGS: Again noted are pedicle screws and rods in the lower lumbar spine
and sacrum. No evidence for free air on the upright view.
Nonobstructive bowel gas pattern. No significant stool burden in the
abdomen or pelvis. Cholecystectomy clips.
IMPRESSION: No acute abnormality.

## 2018-05-29 ENCOUNTER — Encounter: Payer: Self-pay | Admitting: Gastroenterology

## 2018-05-29 ENCOUNTER — Other Ambulatory Visit: Payer: Self-pay

## 2018-05-29 ENCOUNTER — Ambulatory Visit (INDEPENDENT_AMBULATORY_CARE_PROVIDER_SITE_OTHER): Payer: Medicare Other | Admitting: Gastroenterology

## 2018-05-29 DIAGNOSIS — E1143 Type 2 diabetes mellitus with diabetic autonomic (poly)neuropathy: Secondary | ICD-10-CM | POA: Diagnosis not present

## 2018-05-29 DIAGNOSIS — K219 Gastro-esophageal reflux disease without esophagitis: Secondary | ICD-10-CM | POA: Diagnosis not present

## 2018-05-29 DIAGNOSIS — K5900 Constipation, unspecified: Secondary | ICD-10-CM

## 2018-05-29 DIAGNOSIS — K3184 Gastroparesis: Secondary | ICD-10-CM | POA: Diagnosis not present

## 2018-05-29 MED ORDER — FAMOTIDINE 20 MG PO TABS: 20.0000 mg | ORAL_TABLET | Freq: Every day | ORAL | 3 refills | Status: DC | PRN

## 2018-05-29 NOTE — Progress Notes (Addendum)
Referring Provider:  Primary Care Physician:  Glenda Chroman, MD  Primary GI: Dr. Gala Romney  Patient Location: Home   Provider Location: [facility] office   Reason for Visit: Follow-up constipation   Persons present on the virtual encounter, with roles: Aliene Altes, PA-C (Provider); [name] [name] (Patient)    Total time (minutes) spent on medical discussion: 30 minutes   Due to COVID-19, visit was conducted using [email or web address] method.  Visit was requested by patient.  Virtual Visit via Telephone Note Due to COVID-19, visit is conducted virtually and was requested by patient.   I connected with [name] on 05/29/18 at  2:00 PM EDT by [email or web address] and verified that I am speaking with the correct person using two identifiers.   I discussed the limitations, risks, security and privacy concerns of performing an evaluation and management service by telephone/video and the availability of in person appointments. I also discussed with the patient that there may be a patient responsible charge related to this service. The patient expressed understanding and agreed to proceed.  Chief Complaint  Patient presents with  . Constipation     History of Present Illness:  [name] [name] is a 58 y.o female presenting for follow up. History of opioid induced constipation, GERD, and gastroparesis. Last seen in the office on 01/30/2018 for further management of constipation for which she was on Linzess 257mcg daily and several OTC agents including MiraLAX, Lactulose, and Colace. Movantik did not work in the past. Linzess was changed to Amitiza 48mcg BID. Colonoscopy up to date on 12/04/2016 with redundant colon, diverticulosis in sigmoid colon. Due for surveillance in 2023.   Today she states her constipation is now worse since she started Ozempic about 3 months ago due to her A1C being 8.3. She tried Amitiza after last visit which caused worsening constipation. She took "a lot of MiraLAX", resumed Linzess, and got  her BMs back to normal prior to Ozempic. With Ozempic she said, "It was like everything stopped", and she started having worsening nausea, reflux, abdominal discomfort, and constipation.   Now taking MiraLAX 2 capfuls, 1 lactulose, Linzess 254mcg, and 2 colace daily. Small soft BM every day, but doesn't feel productive. Typically, about every 3-4 days she will have a large BM that is diarrhea. If she doesn't have a BM, she will use a suppository. Abdominal pain improves after good BM.   She has continued taking Zofran for her nausea, which seems to help.   Careful with diet due to history of gastroparesis. Knows she has difficulty digesting certain fruits and vegetables. Drinks 8-10 bottles of water daily.    Reflux symptoms were well controlled on Nexium 40mg  daily prior to Ozempic now with with belching daily and acid reflux 3-4 times a week. Takes tums for breakthrough. Used to take Zantac which worked better.   No blood in the stool, no melena. No unintentional weight loss, no vomiting, good appetite, no trouble swallowing.    Past Medical History:  Diagnosis Date  . Bladder dysfunction    Self urinary catheterization  . COPD (chronic obstructive pulmonary disease) ([place])   . DDD (degenerative disc disease)   . Depression   . Esophageal spasm    NTG and Norvasc  . Essential hypertension, benign   . Fibromyalgia   . Gastroparesis   . GERD (gastroesophageal reflux disease)   . History of cardiac catheterization    Normal coronary arteries 11/12  . History of pituitary tumor   . Irritable bowel syndrome   .  Lymphedema   . Osteoarthritis   . PAF (paroxysmal atrial fibrillation) ([place])   . Sleep apnea    BIPAP  . Type 2 diabetes mellitus ([place])   . Urinary tract infection      Past Surgical History:  Procedure Laterality Date  . ABDOMINAL HYSTERECTOMY    . ANTERIOR CERVICAL DECOMP/DISCECTOMY FUSION N/A 12/02/2014   Procedure: Cervical five cervical six anterior cervical  decompression with fusion interbody prosthesis plating and bone graft;  Surgeon: Newman Pies, MD;  Location: [place] [facility];  Service: Neurosurgery;  Laterality: N/A;  C56 anterior cervical decompression with fusion interbody prosthesis plating and bonegraft  . APPENDECTOMY    . BACK SURGERY    . CARDIAC CATHETERIZATION    . CARDIOVERSION N/A 03/26/2014   Procedure: CARDIOVERSION;  Surgeon: Herminio Commons, MD;  Location: [facility];  Service: Endoscopy;  Laterality: N/A;  . CARDIOVERSION N/A 05/04/2014   Procedure: CARDIOVERSION;  Surgeon: Satira Sark, MD;  Location: [facility];  Service: Cardiovascular;  Laterality: N/A;  . CHOLECYSTECTOMY    . COLONOSCOPY     Approximately 2003. Per medical records, internal hemorrhoids noted  . COLONOSCOPY N/A 06/25/2013   Dr. Gala Romney: Anal canal hemorrhoids-more likely the source of paper hematochezia. Redundant, capacious colon. Multiple colonic polyps-tubular adenoma  . COLONOSCOPY WITH PROPOFOL N/A 12/04/2016   Dr. Gala Romney: redundant colon, colonic diverticulosis  . ENDOVENOUS ABLATION SAPHENOUS VEIN W/ LASER Left 03/07/2018   endovenous ablation left greater saphenous vein by Deitra Mayo MD   . ESOPHAGOGASTRODUODENOSCOPY  10/12/2004   DGL:OVFI plaquing on the esophageal mucosa of uncertain significance, not typical of what is seen with candida esophagitis status post KOH brushing for KOH prep and biopsy for histology. Rule out candida esophagitis/eosinophilic esophagitis. Otherwise normal esophagus. Tiny hiatal hernia. Otherwise, normal stomach, normal D1 and D2. Benign biopsy of esophagus, unknown KOH status.   . ESOPHAGOGASTRODUODENOSCOPY N/A 06/25/2013   Dr. Rourk:mild chronic gastritis  . KNEE SURGERY    . LEFT HEART CATHETERIZATION WITH CORONARY ANGIOGRAM N/A 11/15/2010   Procedure: LEFT HEART CATHETERIZATION WITH CORONARY ANGIOGRAM;  Surgeon: Laverda Page, MD;  Location: [facility] C[facility] LAB;  Service: Cardiovascular;  Laterality: N/A;  . LUNG  BIOPSY       Current Meds  Medication Sig  . albuterol (PROAIR HFA) 108 (90 BASE) MCG/ACT inhaler Inhale 2 puffs into the lungs every 6 (six) hours as needed for wheezing or shortness of breath.   Marland Kitchen albuterol (PROVENTIL) (2.5 MG/3ML) 0.083% nebulizer solution Take 2.5 mg by nebulization every 6 (six) hours as needed for wheezing. For shortness of breath.  Marland Kitchen atorvastatin (LIPITOR) 10 MG tablet Take 10 mg by mouth at bedtime.  . baclofen (LIORESAL) 20 MG tablet Take 20 mg by mouth 3 (three) times daily.    Marland Kitchen buPROPion (WELLBUTRIN) 75 MG tablet Take 1 tablet (75 mg total) by mouth daily.  . CONSTULOSE 10 GM/15ML solution TAKE 15MLS BY MOUTH DAILY  . diclofenac sodium (VOLTAREN) 1 % GEL Apply 2-4 g topically 4 (four) times daily as needed (for pain).   Marland Kitchen diltiazem (CARDIZEM CD) 120 MG 24 hr capsule TAKE ONE CAPSULE BY MOUTH EVERY DAY  . docusate sodium (COLACE) 100 MG capsule Take 100 mg by mouth 2 (two) times daily.  . flecainide (TAMBOCOR) 50 MG tablet TAKE TWO TABLETS BY MOUTH EVERY MORNING and TAKE TWO TABLETS BY MOUTH EVERY EVENING  . fluticasone (FLONASE) 50 MCG/ACT nasal spray Place 2 sprays into both nostrils daily.  . furosemide (LASIX) 80  MG tablet Take 80 mg by mouth 2 (two) times daily.    Marland Kitchen gabapentin (NEURONTIN) 800 MG tablet Take 1 tablet by mouth 4 (four) times daily.  Marland Kitchen glimepiride (AMARYL) 2 MG tablet Take 4 mg by mouth 2 (two) times daily.   . hydrocortisone (ANUSOL-HC) 2.5 % rectal cream Place 1 application rectally 2 (two) times daily. (Patient taking differently: Place 1 application rectally as needed. )  . Insulin Degludec (TRESIBA [place]) Inject 46 Units into the skin at bedtime.   . isosorbide mononitrate (IMDUR) 120 MG 24 hr tablet Take 120 mg by mouth every morning.   . isosorbide mononitrate (IMDUR) 30 MG 24 hr tablet Take 30 mg by mouth every evening.   Marland Kitchen LINZESS 290 MCG CAPS capsule TAKE ONE CAPSULE BY MOUTH EVERY DAY BEFORE BREAKFAST - REPLACES MOVANTIK  . lisinopril  (PRINIVIL,ZESTRIL) 10 MG tablet Take 10 mg by mouth every evening.   . loratadine (CLARITIN) 10 MG tablet Take 10 mg by mouth daily.    . meclizine (ANTIVERT) 25 MG tablet Take 25 mg by mouth 3 (three) times daily as needed for dizziness.   . metFORMIN (GLUCOPHAGE) 500 MG tablet Take 1,000 mg by mouth 2 (two) times daily.   . Multiple Vitamin (MULTIVITAMIN WITH MINERALS) TABS tablet Take 1 tablet by mouth daily.  Marland Kitchen NEXIUM 40 MG capsule TAKE ONE CAPSULE BY MOUTH EVERY DAY BEFORE BREAKFAST (seven IN THE MORNING PER pt)  . nitroGLYCERIN (NITROLINGUAL) 0.4 MG/SPRAY spray USE 1 SPRAY UNDER THE TONGUE AS NEEDED FOR CHEST PAIN  . NUCYNTA ER 200 MG TB12 Take 200 mg by mouth every 12 (twelve) hours.   Marland Kitchen nystatin (MYCOSTATIN/NYSTOP) 100000 UNIT/GM POWD Apply 1 g topically daily.   Marland Kitchen omega-3 acid ethyl esters (LOVAZA) 1 g capsule Take 1 g by mouth 4 (four) times daily.   . ondansetron (ZOFRAN) 4 MG tablet TAKE 4 MG BY MOUTH EVERY 8 HOURS AS NEEDED FOR NAUSEA OR VOMITING  . oxyCODONE-acetaminophen (PERCOCET) 10-325 MG per tablet Take 1 tablet by mouth every 6 (six) hours as needed for pain.   . polyethylene glycol powder (GLYCOLAX/MIRALAX) powder Take 34 g by mouth daily.   . potassium chloride SA (K-DUR,KLOR-CON) 20 MEQ tablet Take 40 mEq by mouth 4 (four) times daily.    . Rivaroxaban (XARELTO) 20 MG TABS Take 20 mg by mouth every evening.   . Semaglutide,0.25 or 0.5MG /DOS, (OZEMPIC, 0.25 OR 0.5 MG/DOSE,) 2 MG/1.5ML SOPN Inject 0.25 mg into the skin once a week.  . spironolactone (ALDACTONE) 50 MG tablet Take 50 mg by mouth 3 (three) times daily.       Family History  Problem Relation Age of Onset  . Coronary artery disease Father        Died age 32  . Heart attack Father   . Arrhythmia Father        AF  . Diabetes Father   . Parkinson's disease Father   . Arrhythmia Mother        AF  . Stroke Mother   . Dementia Mother   . Cancer Mother        UTERINE   . Parkinson's disease Mother   .  Arrhythmia Brother        AF  . Colon cancer Maternal Grandfather   . Colon cancer Paternal Aunt   . Colon cancer Paternal Aunt   . Diabetes Son   . Cancer Sister        BREAST   . Depression  Sister   . Depression Sister   . Depression Sister     Social History   Socioeconomic History  . Marital status: Divorced    Spouse name: Not on file  . Number of children: Not on file  . Years of education: Not on file  . Highest education level: Not on file  Occupational History  . Not on file  Social Needs  . Financial resource strain: Not on file  . Food insecurity:    Worry: Not on file    Inability: Not on file  . Transportation needs:    Medical: Not on file    Non-medical: Not on file  Tobacco Use  . Smoking status: Former Smoker    Packs/day: 1.50    Years: 30.00    Pack years: 45.00    Types: Cigarettes    Start date: 03/06/1977    Last attempt to quit: 01/03/2007    Years since quitting: 11.4  . Smokeless tobacco: Never Used  Substance and Sexual Activity  . Alcohol use: No    Alcohol/week: 0.0 standard drinks  . Drug use: No  . Sexual activity: Never    Birth control/protection: Surgical  Lifestyle  . Physical activity:    Days per week: Not on file    Minutes per session: Not on file  . Stress: Not on file  Relationships  . Social connections:    Talks on phone: Not on file    Gets together: Not on file    Attends religious service: Not on file    Active member of club or organization: Not on file    Attends meetings of clubs or organizations: Not on file    Relationship status: Not on file  Other Topics Concern  . Not on file  Social History Narrative  . Not on file       Review of Systems: Gen: Denies fever, chills  CV: Denies chest pain, palpitations Resp: Denies dyspnea at rest, cough GI: see HPI Psych: Denies depression, anxiety Heme: see HPI  Observations/Objective: Pleasant, cooperative female. No distress.  Assessment and Plan: 58  y.o. female with chronic constipation in the setting of opioids, gastroparesis, and diabetes not ideally controlled. Recently started Ozempic with noticeably worsening constipation, nausea, and abdominal discomfort. Taking Linzess 290mg  and multiple OTC medications without regular BMs. I suspect constipation is primarily medication induced and being worsened by the addition of Ozempic. She has no alarm symptoms and last colonoscopy in 2018 showed a redundant colon and diverticulosis and she was having constipation at this time as well.   At this time, we will provide samples of Motegrity 2mg . Patient will hold Linzess and take Motegrity for a few days. If bowels are not moving, she may add Linzess back and continue with Motegrity. Hopefully, she will be able to wean down some of the OTC medications with this change. Patient was advised to stop Motegtiry if she began to have any increase in depression or suicidal ideations. I have also advised she reach out to her PCP about the Ozempic as this was clearly linked to a worsening of her constipation and she is already on several medications to try to help with her constipation.   GERD with worsening reflux since Ozempic with breakthrough symptoms 3-4 times per week. Continue taking Nexium 40mg  daily before breakfast. Take Pepcid 20mg  as needed for breakthrough reflux.   Gastroparesis in the setting of diabetes. Typically with nausea, but this has worsened since starting Ozempic. No vomiting.  Continue taking Zofran as needed and contact PCP about the Ozempic.     Follow Up Instructions: Follow up in 3 months.   Call with an update on how Motegrity is working so we can adjust therapy or call in a prescription.   I discussed the assessment and treatment plan with the patient. The patient was provided an opportunity to ask questions and all were answered. The patient agreed with the plan and demonstrated an understanding of the instructions.   The patient  was advised to call back or seek an in-person evaluation if the symptoms worsen or if the condition fails to improve as anticipated.  I provided 30 minutes of virtual face-to-face time during this encounter.  Aliene Altes, PA-C [facility]

## 2018-05-29 NOTE — Patient Instructions (Addendum)
Start taking Motegrity 2mg  tablet daily for constipation. Hold Linzess when you start [place] for 3 days. If your bowels are not moving, you may then add Linzess back and take Linzess and Motegrity together.   Continue Zofran for nausea.  Continue Nexium 40mg  daily before breakfast for GERD. Start taking Pepcid 20mg  as needed for breakthrough symptoms.   Please contact your PCP about the worsening constipation, nausea, and reflux that you are experiencing since starting Ozempic.   Call in 1 week to let me know how you are doing with Motegrity so I can send in a prescription or adjust therapy.   We will see you back in 3 months!  Aliene Altes, PA-C [facility]

## 2018-05-30 ENCOUNTER — Encounter: Payer: Self-pay | Admitting: Internal Medicine

## 2018-05-30 DIAGNOSIS — Z79891 Long term (current) use of opiate analgesic: Secondary | ICD-10-CM | POA: Diagnosis not present

## 2018-05-30 DIAGNOSIS — G894 Chronic pain syndrome: Secondary | ICD-10-CM | POA: Diagnosis not present

## 2018-05-30 DIAGNOSIS — E1142 Type 2 diabetes mellitus with diabetic polyneuropathy: Secondary | ICD-10-CM | POA: Diagnosis not present

## 2018-05-30 DIAGNOSIS — M48062 Spinal stenosis, lumbar region with neurogenic claudication: Secondary | ICD-10-CM | POA: Diagnosis not present

## 2018-06-03 DIAGNOSIS — I2510 Atherosclerotic heart disease of native coronary artery without angina pectoris: Secondary | ICD-10-CM | POA: Diagnosis not present

## 2018-06-03 DIAGNOSIS — E119 Type 2 diabetes mellitus without complications: Secondary | ICD-10-CM | POA: Diagnosis not present

## 2018-06-03 DIAGNOSIS — I10 Essential (primary) hypertension: Secondary | ICD-10-CM | POA: Diagnosis not present

## 2018-06-05 ENCOUNTER — Other Ambulatory Visit: Payer: Self-pay | Admitting: Cardiology

## 2018-06-06 DIAGNOSIS — I10 Essential (primary) hypertension: Secondary | ICD-10-CM | POA: Diagnosis not present

## 2018-06-06 DIAGNOSIS — R0602 Shortness of breath: Secondary | ICD-10-CM | POA: Diagnosis not present

## 2018-06-06 DIAGNOSIS — Z6841 Body Mass Index (BMI) 40.0 and over, adult: Secondary | ICD-10-CM | POA: Diagnosis not present

## 2018-06-06 DIAGNOSIS — I4891 Unspecified atrial fibrillation: Secondary | ICD-10-CM | POA: Diagnosis not present

## 2018-06-06 DIAGNOSIS — R609 Edema, unspecified: Secondary | ICD-10-CM | POA: Diagnosis not present

## 2018-06-06 DIAGNOSIS — Z299 Encounter for prophylactic measures, unspecified: Secondary | ICD-10-CM | POA: Diagnosis not present

## 2018-06-08 ENCOUNTER — Other Ambulatory Visit: Payer: Self-pay | Admitting: Cardiology

## 2018-06-10 ENCOUNTER — Telehealth: Payer: Self-pay | Admitting: Internal Medicine

## 2018-06-10 ENCOUNTER — Other Ambulatory Visit: Payer: Self-pay | Admitting: Gastroenterology

## 2018-06-10 MED ORDER — PRUCALOPRIDE SUCCINATE 2 MG PO TABS: 2.0000 mg | ORAL_TABLET | Freq: Every day | ORAL | 5 refills | Status: DC

## 2018-06-10 NOTE — Telephone Encounter (Signed)
Forwarding to [employer], PA to send in the prescription for Motegrity.

## 2018-06-10 NOTE — Telephone Encounter (Signed)
I have sent in [place] 2mg  tablets to [facility]. It doesn't look like this will be covered by her insurance, but I went ahead and sent it in as our system doesn't always predict this correctly. Please let patient know and have her call if it is too expensive.

## 2018-06-10 NOTE — Telephone Encounter (Signed)
Pt called to let us know that the medication was working (it starts with M) but she has to take it with Linzess. She needs prescription called into [facility]

## 2018-06-10 NOTE — Telephone Encounter (Signed)
PT is aware.

## 2018-06-11 ENCOUNTER — Telehealth: Payer: Self-pay

## 2018-06-11 NOTE — Telephone Encounter (Signed)
Received approval on Motetrity. Coverage approved from 03/13/2018 through 06/11/2019.  Faxing to the pharmacy.

## 2018-06-11 NOTE — Telephone Encounter (Signed)
I have started a PA for the [place].

## 2018-06-12 DIAGNOSIS — Z6841 Body Mass Index (BMI) 40.0 and over, adult: Secondary | ICD-10-CM | POA: Diagnosis not present

## 2018-06-12 DIAGNOSIS — Z299 Encounter for prophylactic measures, unspecified: Secondary | ICD-10-CM | POA: Diagnosis not present

## 2018-06-12 DIAGNOSIS — E1142 Type 2 diabetes mellitus with diabetic polyneuropathy: Secondary | ICD-10-CM | POA: Diagnosis not present

## 2018-06-12 DIAGNOSIS — E1165 Type 2 diabetes mellitus with hyperglycemia: Secondary | ICD-10-CM | POA: Diagnosis not present

## 2018-06-12 DIAGNOSIS — I4891 Unspecified atrial fibrillation: Secondary | ICD-10-CM | POA: Diagnosis not present

## 2018-06-17 DIAGNOSIS — R600 Localized edema: Secondary | ICD-10-CM | POA: Diagnosis not present

## 2018-06-18 DIAGNOSIS — Z1211 Encounter for screening for malignant neoplasm of colon: Secondary | ICD-10-CM | POA: Diagnosis not present

## 2018-06-18 DIAGNOSIS — Z1339 Encounter for screening examination for other mental health and behavioral disorders: Secondary | ICD-10-CM | POA: Diagnosis not present

## 2018-06-18 DIAGNOSIS — Z1331 Encounter for screening for depression: Secondary | ICD-10-CM | POA: Diagnosis not present

## 2018-06-18 DIAGNOSIS — Z79899 Other long term (current) drug therapy: Secondary | ICD-10-CM | POA: Diagnosis not present

## 2018-06-18 DIAGNOSIS — Z0000 Encounter for general adult medical examination without abnormal findings: Secondary | ICD-10-CM | POA: Diagnosis not present

## 2018-06-18 DIAGNOSIS — Z7189 Other specified counseling: Secondary | ICD-10-CM | POA: Diagnosis not present

## 2018-06-18 DIAGNOSIS — E7800 Pure hypercholesterolemia, unspecified: Secondary | ICD-10-CM | POA: Diagnosis not present

## 2018-06-18 DIAGNOSIS — Z6841 Body Mass Index (BMI) 40.0 and over, adult: Secondary | ICD-10-CM | POA: Diagnosis not present

## 2018-06-18 DIAGNOSIS — Z299 Encounter for prophylactic measures, unspecified: Secondary | ICD-10-CM | POA: Diagnosis not present

## 2018-06-18 DIAGNOSIS — R5383 Other fatigue: Secondary | ICD-10-CM | POA: Diagnosis not present

## 2018-06-18 DIAGNOSIS — I10 Essential (primary) hypertension: Secondary | ICD-10-CM | POA: Diagnosis not present

## 2018-06-18 DIAGNOSIS — E1165 Type 2 diabetes mellitus with hyperglycemia: Secondary | ICD-10-CM | POA: Diagnosis not present

## 2018-06-24 ENCOUNTER — Encounter: Payer: Self-pay | Admitting: *Deleted

## 2018-06-25 ENCOUNTER — Ambulatory Visit (INDEPENDENT_AMBULATORY_CARE_PROVIDER_SITE_OTHER): Payer: Medicare Other | Admitting: Physician Assistant

## 2018-06-25 ENCOUNTER — Other Ambulatory Visit: Payer: Self-pay

## 2018-06-25 ENCOUNTER — Encounter: Payer: Self-pay | Admitting: Physician Assistant

## 2018-06-25 VITALS — BP 121/70 | HR 81 | Temp 98.3°F | Ht 69.0 in | Wt 328.2 lb

## 2018-06-25 DIAGNOSIS — I5033 Acute on chronic diastolic (congestive) heart failure: Secondary | ICD-10-CM

## 2018-06-25 DIAGNOSIS — I480 Paroxysmal atrial fibrillation: Secondary | ICD-10-CM

## 2018-06-25 DIAGNOSIS — I10 Essential (primary) hypertension: Secondary | ICD-10-CM | POA: Diagnosis not present

## 2018-06-25 DIAGNOSIS — R0789 Other chest pain: Secondary | ICD-10-CM | POA: Diagnosis not present

## 2018-06-25 DIAGNOSIS — R079 Chest pain, unspecified: Secondary | ICD-10-CM

## 2018-06-25 NOTE — Progress Notes (Signed)
Cardiology Office Note   Date:  06/25/2018   ID:  TELESA [name] [name], DOB 07-14-1960, MRN [id number]  PCP:  Glenda Chroman, MD Cardiologist:  Rozann Lesches, MD 11/14/2017 [name] Ferries, PA-C    History of Present Illness: [name] [name] is a 58 y.o. female with a history of PAF, DM, HTN, GERD, nl cors 2012, IBS, OA, OSA on BiPAP, depression, IVCD on ECG, varicose veins s/p ablation L greater saphenous vein  [name] [name] presents for cardiology follow up.  The vein ablation went well.   She was taking her Lasix and wearing the compression socks. However, she put on fluid, with LE edema and dyspnea. The NP for Dr Woody Seller added metolazone and did an echo. She lost 14 lbs, her breathing improved. She went from 334>>320 lbs. However, she does not feel she is at baseline.   She had been weighing herself, but is not doing that as often now. Says the weight gain occurred within a 2 week period.   She denies ongoing sodium indiscretion. However, she eats canned soups upon occasion. Eats hot dogs.   She uses BiPAP every night.   Has been using her albuterol inhaler more because of the DOE.  Was using nebulizers as well when the fluid was bad, but they made her very jittery.  She is not using the nebulizers much now.  She has not had any palpitations, does not think she has had any more atrial fibrillation.  She has brought her A1c down from 8.4>>6.7 with dietary changes.   She has been getting chest pain. The last episode was when she was getting ready for church. She was hurrying, and felt anxious.   She developed chest tightness, 4/10. She took SL NTG x 1 and sx resolved. She also rested a little and caught her breath.  The shortness of breath always starts and then she gets the chest pain.  The worst episode of CP was when her volume status was at its worst. It reached an 8/10. It took a total of 4 nitro sprays and an ASA to get rid of the pain. Since her volume status improved, she  has not had one that bad.   When she saw Dr Manuella Ghazi, he was concerned that she might need another cath.    Past Medical History:  Diagnosis Date  . Bladder dysfunction    Self urinary catheterization  . COPD (chronic obstructive pulmonary disease) ([place])   . DDD (degenerative disc disease)   . Depression   . Esophageal spasm    NTG and Norvasc  . Essential hypertension, benign   . Fibromyalgia   . Gastroparesis   . GERD (gastroesophageal reflux disease)   . History of cardiac catheterization    Normal coronary arteries 11/12  . History of pituitary tumor   . Irritable bowel syndrome   . Lymphedema   . Osteoarthritis   . PAF (paroxysmal atrial fibrillation) ([name])   . Sleep apnea    BIPAP  . Type 2 diabetes mellitus ([name] [name])   . Urinary tract infection     Past Surgical History:  Procedure Laterality Date  . ABDOMINAL HYSTERECTOMY    . ANTERIOR CERVICAL DECOMP/DISCECTOMY FUSION N/A 12/02/2014   Procedure: Cervical five cervical six anterior cervical decompression with fusion interbody prosthesis plating and bone graft;  Surgeon: Newman Pies, MD;  Location: [place] [facility];  Service: Neurosurgery;  Laterality: N/A;  C56 anterior cervical decompression with fusion interbody prosthesis plating and bonegraft  .  APPENDECTOMY    . BACK SURGERY    . CARDIAC CATHETERIZATION    . CARDIOVERSION N/A 03/26/2014   Procedure: CARDIOVERSION;  Surgeon: Herminio Commons, MD;  Location: [facility];  Service: Endoscopy;  Laterality: N/A;  . CARDIOVERSION N/A 05/04/2014   Procedure: CARDIOVERSION;  Surgeon: Satira Sark, MD;  Location: [facility];  Service: Cardiovascular;  Laterality: N/A;  . CHOLECYSTECTOMY    . COLONOSCOPY     Approximately 2003. Per medical records, internal hemorrhoids noted  . COLONOSCOPY N/A 06/25/2013   Dr. Gala Romney: Anal canal hemorrhoids-more likely the source of paper hematochezia. Redundant, capacious colon. Multiple colonic polyps-tubular adenoma  . COLONOSCOPY WITH  PROPOFOL N/A 12/04/2016   Dr. Gala Romney: redundant colon, colonic diverticulosis. Screening due in 2023  . ENDOVENOUS ABLATION SAPHENOUS VEIN W/ LASER Left 03/07/2018   endovenous ablation left greater saphenous vein by Deitra Mayo MD   . ESOPHAGOGASTRODUODENOSCOPY  10/12/2004   XNT:ZGYF plaquing on the esophageal mucosa of uncertain significance, not typical of what is seen with candida esophagitis status post KOH brushing for KOH prep and biopsy for histology. Rule out candida esophagitis/eosinophilic esophagitis. Otherwise normal esophagus. Tiny hiatal hernia. Otherwise, normal stomach, normal D1 and D2. Benign biopsy of esophagus, unknown KOH status.   . ESOPHAGOGASTRODUODENOSCOPY N/A 06/25/2013   Dr. Rourk:mild chronic gastritis  . KNEE SURGERY    . LEFT HEART CATHETERIZATION WITH CORONARY ANGIOGRAM N/A 11/15/2010   Procedure: LEFT HEART CATHETERIZATION WITH CORONARY ANGIOGRAM;  Surgeon: Laverda Page, MD;  Location: [facility] C[facility];  Service: Cardiovascular;  Laterality: N/A;  . LUNG BIOPSY      Current Outpatient Medications  Medication Sig Dispense Refill  . albuterol (PROAIR HFA) 108 (90 BASE) MCG/ACT inhaler Inhale 2 puffs into the lungs every 6 (six) hours as needed for wheezing or shortness of breath.     Marland Kitchen albuterol (PROVENTIL) (2.5 MG/3ML) 0.083% nebulizer solution Take 2.5 mg by nebulization every 6 (six) hours as needed for wheezing. For shortness of breath.    Marland Kitchen atorvastatin (LIPITOR) 10 MG tablet Take 10 mg by mouth at bedtime.  3  . baclofen (LIORESAL) 20 MG tablet Take 20 mg by mouth 3 (three) times daily.      Marland Kitchen buPROPion (WELLBUTRIN) 75 MG tablet Take 1 tablet (75 mg total) by mouth daily. 30 tablet 3  . CONSTULOSE 10 GM/15ML solution TAKE 15MLS BY MOUTH DAILY 473 mL 5  . diclofenac sodium (VOLTAREN) 1 % GEL Apply 2-4 g topically 4 (four) times daily as needed (for pain).     Marland Kitchen diltiazem (CARDIZEM CD) 120 MG 24 hr capsule TAKE ONE CAPSULE BY MOUTH EVERY DAY 30 capsule 0   . docusate sodium (COLACE) 100 MG capsule Take 100 mg by mouth 2 (two) times daily.    . famotidine (PEPCID) 20 MG tablet Take 1 tablet (20 mg total) by mouth daily as needed for heartburn or indigestion. 30 tablet 3  . flecainide (TAMBOCOR) 50 MG tablet TAKE TWO TABLETS BY MOUTH EVERY MORNING and TAKE TWO TABLETS BY MOUTH EVERY EVENING 120 tablet 6  . fluticasone (FLONASE) 50 MCG/ACT nasal spray Place 2 sprays into both nostrils daily.  6  . furosemide (LASIX) 80 MG tablet Take 80 mg by mouth 2 (two) times daily.      Marland Kitchen gabapentin (NEURONTIN) 800 MG tablet Take 1 tablet by mouth 4 (four) times daily.  2  . glimepiride (AMARYL) 2 MG tablet Take 4 mg by mouth 2 (two) times daily.     Marland Kitchen  hydrocortisone (ANUSOL-HC) 2.5 % rectal cream Place 1 application rectally 2 (two) times daily. (Patient taking differently: Place 1 application rectally as needed. ) 30 g 1  . Insulin Degludec (TRESIBA [place]) Inject 46 Units into the skin at bedtime.     . isosorbide mononitrate (IMDUR) 120 MG 24 hr tablet Take 120 mg by mouth every morning.     . isosorbide mononitrate (IMDUR) 30 MG 24 hr tablet Take 30 mg by mouth every evening.     Marland Kitchen LINZESS 290 MCG CAPS capsule TAKE ONE CAPSULE BY MOUTH EVERY DAY BEFORE BREAKFAST - REPLACES MOVANTIK 30 capsule 5  . lisinopril (PRINIVIL,ZESTRIL) 10 MG tablet Take 10 mg by mouth every evening.     . loratadine (CLARITIN) 10 MG tablet Take 10 mg by mouth daily.      . meclizine (ANTIVERT) 25 MG tablet Take 25 mg by mouth 3 (three) times daily as needed for dizziness.     . metFORMIN (GLUCOPHAGE) 500 MG tablet Take 1,000 mg by mouth 2 (two) times daily.     . metolazone (ZAROXOLYN) 2.5 MG tablet Take 2.5 mg by mouth daily.    . Multiple Vitamin (MULTIVITAMIN WITH MINERALS) TABS tablet Take 1 tablet by mouth daily.    Marland Kitchen NEXIUM 40 MG capsule TAKE ONE CAPSULE BY MOUTH EVERY DAY BEFORE BREAKFAST (seven IN THE MORNING PER pt) 90 capsule 3  . nitroGLYCERIN (NITROLINGUAL) 0.4 MG/SPRAY spray  USE 1 SPRAY UNDER THE TONGUE AS NEEDED FOR CHEST PAIN - PA SENT 11/28/2016- JB 12 g 3  . NUCYNTA ER 200 MG TB12 Take 200 mg by mouth every 12 (twelve) hours.     Marland Kitchen nystatin (MYCOSTATIN/NYSTOP) 100000 UNIT/GM POWD Apply 1 g topically daily.     Marland Kitchen omega-3 acid ethyl esters (LOVAZA) 1 g capsule Take 1 g by mouth 4 (four) times daily.     . ondansetron (ZOFRAN) 4 MG tablet TAKE 4 MG BY MOUTH EVERY 8 HOURS AS NEEDED FOR NAUSEA OR VOMITING 30 tablet 3  . oxyCODONE-acetaminophen (PERCOCET) 10-325 MG per tablet Take 1 tablet by mouth every 6 (six) hours as needed for pain.     . polyethylene glycol powder (GLYCOLAX/MIRALAX) powder Take 34 g by mouth daily.     . potassium chloride SA (K-DUR,KLOR-CON) 20 MEQ tablet Take 40 mEq by mouth 5 (five) times daily.     . Prucalopride Succinate (MOTEGRITY) 2 MG TABS Take 2 mg by mouth daily. 30 tablet 5  . Rivaroxaban (XARELTO) 20 MG TABS Take 20 mg by mouth every evening.     . Semaglutide,0.25 or 0.5MG /DOS, (OZEMPIC, 0.25 OR 0.5 MG/DOSE,) 2 MG/1.5ML SOPN Inject 0.25 mg into the skin once a week.    . spironolactone (ALDACTONE) 50 MG tablet Take 50 mg by mouth 3 (three) times daily.       No current facility-administered medications for this visit.     Allergies:   Levofloxacin, Tape, Hyoscyamine sulfate, and Metoclopramide    Social History:  The patient  reports that she quit smoking about 11 years ago. Her smoking use included cigarettes. She started smoking about 41 years ago. She has a 45.00 pack-year smoking history. She has never used smokeless tobacco. She reports that she does not drink alcohol or use drugs.   Family History:  The patient's family history includes Arrhythmia in her brother, father, and mother; Cancer in her mother and sister; Colon cancer in her maternal grandfather, paternal aunt, and paternal aunt; Coronary artery disease in her father;  Dementia in her mother; Depression in her sister, sister, and sister; Diabetes in her father and  son; Heart attack in her father; Parkinson's disease in her father and mother; Stroke in her mother.  She indicated that her mother is deceased. She indicated that her father is deceased. She indicated that the status of her brother is unknown. She indicated that the status of her maternal grandfather is unknown. She indicated that the status of her son is unknown.   ROS:  Please see the history of present illness. All other systems are reviewed and negative.    PHYSICAL EXAM: VS:  BP 121/70   Pulse 81   Temp 98.3 F (36.8 C)   Ht 5\' 9"  (1.753 m)   Wt (!) 328 lb 3.2 oz (148.9 kg)   SpO2 97%   BMI 48.47 kg/m  , BMI Body mass index is 48.47 kg/m. GEN: Well nourished, well developed, female in no acute distress HEENT: normal for age  Neck: no JVD seen, difficult to assess secondary to body habitus, no carotid bruit, no masses Cardiac: RRR; no murmur, no rubs, or gallops Respiratory: Rales bases bilaterally, normal work of breathing GI: soft, nontender, nondistended, + BS MS: no deformity or atrophy; 2+ left lower extremity edema, 1+ right lower extremity edema; distal pulses are 2+ in upper extremities, not able to palpate in lower extremities due to swelling but capillary refill is within normal limits Skin: warm and dry, no rash Neuro:  Strength and sensation are intact Psych: euthymic mood, full affect   EKG:  EKG is not ordered today.  ECHO: 06/17/2018 at [facility] Technically limited with poor acoustic window, body habitus, patient inability to turn, COPD, morbid obesity Left ventricle chamber size is normal wall thickness is normal Left atrium, right atrium, aortic valve, mitral valve, tricuspid valve, pulmonic valve not well visualized No pericardial effusion Visualized portion of aortic root is within normal limits Inferior vena cava not well visualized, IVC not visualized E to A reversal noted No further interpretation possible Due to poor endocardial  definition, resting MUGA or TEE may be helpful if clinical needed.  Clinical correlation by ordering physician recommended.  Repeat echo with possibly a different sonographer or when patient is able to turn. Gomez Cleverly, MD  Ultrasound lower extremity post ablation, left: 03/14/2018 Left Technical Findings: The GSV appears non compressible from the level of the knee to approximately 8.25 cm from the SFJ.    Summary: Left: No evidence of deep vein thrombosis in the common femoral, femoral, or popliteal veins.    Recent Labs: 10/24/2017: Hemoglobin 12.2; Platelets 385  CBC    Component Value Date/Time   WBC 12.0 (H) 10/24/2017 1611   RBC 4.42 10/24/2017 1611   HGB 12.2 10/24/2017 1611   HGB 12.8 05/28/2013   HCT 36.1 10/24/2017 1611   HCT 39 05/28/2013   PLT 385 10/24/2017 1611   MCV 81.7 10/24/2017 1611   MCH 27.6 10/24/2017 1611   MCHC 33.8 10/24/2017 1611   RDW 13.4 10/24/2017 1611   LYMPHSABS 2,952 10/24/2017 1611   MONOABS 0.7 09/15/2015 0200   EOSABS 228 10/24/2017 1611   BASOSABS 84 10/24/2017 1611   CMP Latest Ref Rng & Units  06/18/2018 09/15/2015 11/23/2014  Glucose 65 - 139 mg/dL  105  283(H) 119(H)  BUN 7 - 25 mg/dL  18  8 9   Creatinine 0.50 - 1.05 mg/dL  0.63  0.60 0.58  Sodium 135 - 146 mmol/L  139  135  134(L)  Potassium 3.5 - 5.3 mmol/L  3.8  3.5 4.0  Chloride 98 - 110 mmol/L  99  96(L) 98(L)  CO2 20 - 32 mmol/L  24  26 29   Calcium 8.6 - 10.4 mg/dL  10.1  9.6 10.0  Total Protein 6.0 - 8.3 g/dL - - -  Total Bilirubin 0.3 - 1.2 mg/dL - - -  Alkaline Phos 39 - 117 U/L - - -  AST 0 - 37 U/L - - -  ALT 0 - 35 U/L - - -     Lipid Panel 06/18/2018  Total cholesterol  120   Triglycerides  223   HDL  31   LDL  44       Wt Readings from Last 3 Encounters:  06/25/18 (!) 328 lb 3.2 oz (148.9 kg)  03/14/18 (!) 323 lb (146.5 kg)  03/07/18 (!) 323 lb (146.5 kg)     Other studies Reviewed: Additional studies/ records that were reviewed today include: Office  notes, hospital records and testing.  ASSESSMENT AND PLAN:  1.  Acute on chronic diastolic CHF: -Her PCP added metolazone and was able to pull up 14 pounds of fluid with no significant increase in her BUN and creatinine.  They prescribed enough potassium to keep her levels in normal range - She needs additional diuresis, will increase the metolazone to 5 mg daily with 2 extra potassium pills for 3 days, then return to former dosing. - Reinforced the need for daily weights, sodium restriction to 500 mg per meal and fluid restriction to 1500 cc/day -Continue Lasix 80 mg twice daily, and spironolactone 50 mg 3 times daily -She has labs scheduled at her PCP office next week, keep this appointment  2.  Chest pain, uncertain etiology: -She has no history of obstructive CAD - The chest pain is always preceded by shortness of breath and has improved as her volume status improved. - If she continues to have the chest pain once her volume status is at baseline, she is to let us know. -At that time consider testing, per Dr. Domenic Polite -Because of the Xarelto, she is not on aspirin.  Continue isosorbide at current dosing, lisinopril, atorvastatin, and nitroglycerin as needed  3.  PAF: - She is compliant with flecainide totaling 100 mg twice daily. - She is not having palpitations a did not think she had any atrial fibrillation recently, even when her breathing was so bad - Continue flecainide and diltiazem -CHA2DS2-VASc = 4 (CHF, female, HTN, DM) -Continue Xarelto  4.  Hypertension: -Her blood pressure is well controlled today. - Continue diltiazem, Lasix, Imdur, lisinopril, metolazone, and spironolactone   Current medicines are reviewed at length with the patient today.  The patient does not have concerns regarding medicines.  The following changes have been made: Increase metolazone and potassium for 3 days  Labs/ tests ordered today include:  No orders of the defined types were placed in this  encounter.    Disposition:   FU with Rozann Lesches, MD  Signed, [name] Ferries, PA-C  06/25/2018 3:21 PM    [place] Phone: [phone]; Fax: [phone]

## 2018-06-25 NOTE — Patient Instructions (Addendum)
Medication Instructions:   Your physician has recommended you make the following change in your medication:   Take metolazone 5 mg and additional 2 extra potassium pills daily for 3 days, then resume previous doses  Continue all other medications the same  Labwork:  NONE  Testing/Procedures:  NONE  Follow-Up:  Your physician recommends that you schedule a follow-up appointment in: August 2020 at the [facility] office with Dr. Domenic Polite  Any Other Special Instructions Will Be Listed Below (If Applicable). Your physician recommends that you weigh, daily, at the same time every day, and in the same amount of clothing. Please record your daily weights on the handout provided and bring it to your next appointment. Limit sodium to 500 mg per meal  Total daily fluid intake (all liquids) should be 1500 ml or 1&1/2 quart per day.  [name] will discuss further testing with Dr. Domenic Polite  If you need a refill on your cardiac medications before your next appointment, please call your pharmacy.

## 2018-06-26 DIAGNOSIS — E1142 Type 2 diabetes mellitus with diabetic polyneuropathy: Secondary | ICD-10-CM | POA: Diagnosis not present

## 2018-06-26 DIAGNOSIS — M48062 Spinal stenosis, lumbar region with neurogenic claudication: Secondary | ICD-10-CM | POA: Diagnosis not present

## 2018-06-26 DIAGNOSIS — G894 Chronic pain syndrome: Secondary | ICD-10-CM | POA: Diagnosis not present

## 2018-06-26 DIAGNOSIS — Z79891 Long term (current) use of opiate analgesic: Secondary | ICD-10-CM | POA: Diagnosis not present

## 2018-06-29 IMAGING — MR MR LUMBAR SPINE WO/W CM
4 of 7 series · 25 of 48 positions shown · IV contrast (20cc Multihance)
Comparison: CT myelogram 11/05/2014.  Lumbar MRI 10/01/2014

CLINICAL DATA: Spinal stenosis without neurogenic claudication.
Lumbar fusion.

EXAM:
MRI LUMBAR SPINE WITHOUT AND WITH CONTRAST
TECHNIQUE: Multiplanar and multiecho pulse sequences of the lumbar spine were
obtained without and with intravenous contrast.
CONTRAST:  20mL MULTIHANCE GADOBENATE DIMEGLUMINE 529 MG/ML IV SOLN

[Series 3: T1 · sagittal · 4.0mm · 0.55mm/px · 5 of 13 slices shown (1 of 2)]
[im 1/13]
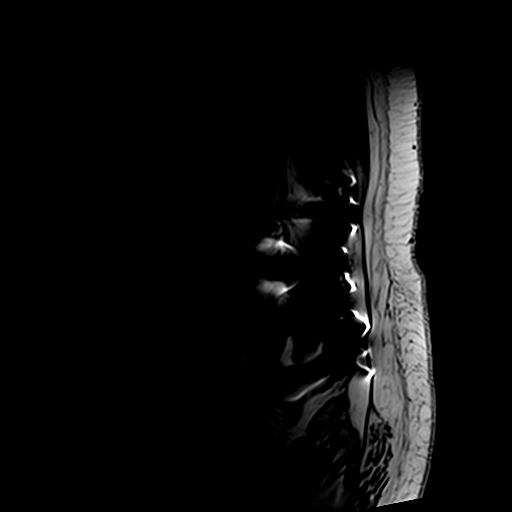
[im 4/13]
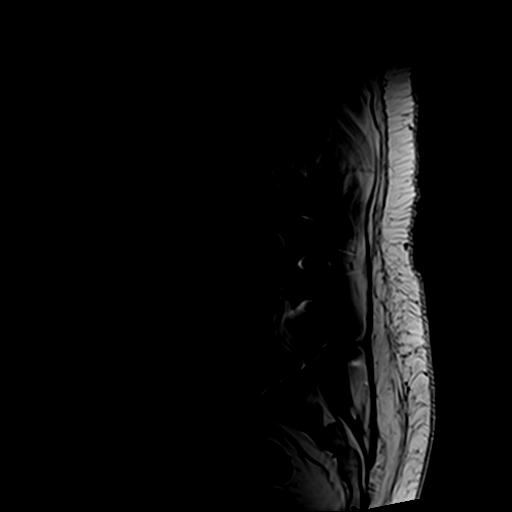
[im 7/13]
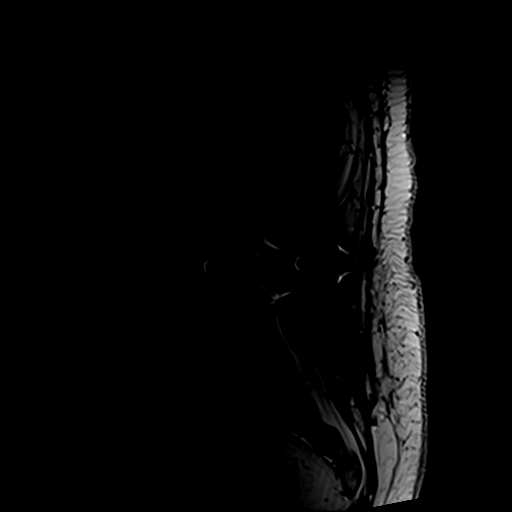
[im 10/13]
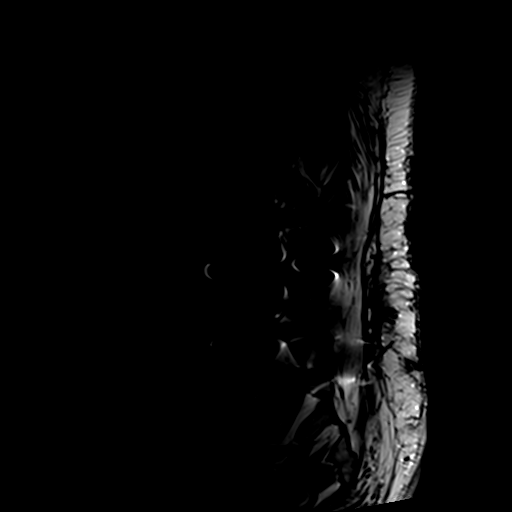
[im 13/13]
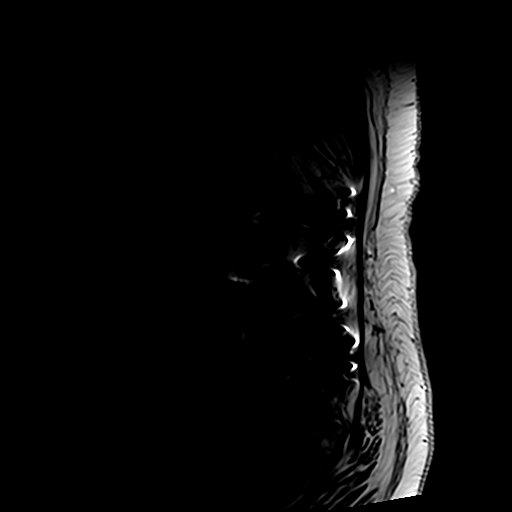

[Series 5: T2 · axial · 4.0mm · 0.70mm/px · z∈[-88,+91]mm · 8 of 33 slices shown (1 of 2)]
[im 1/33]
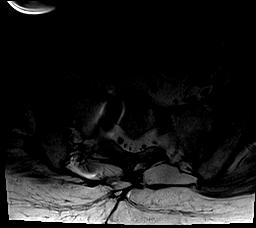
[im 4/33]
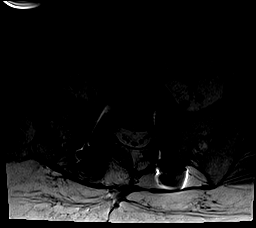
[im 11/33]
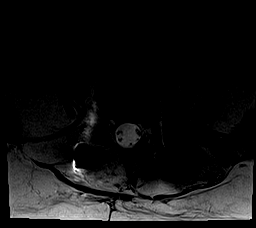
[im 15/33]
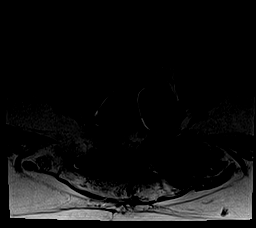
[im 18/33]
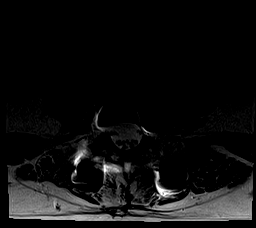
[im 22/33]
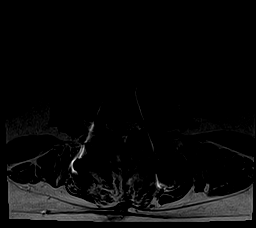
[im 29/33]
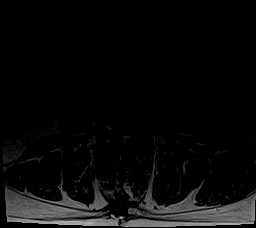
[im 33/33]
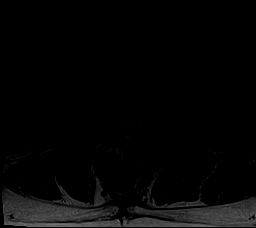

[Series 6: T1 · axial · 4.0mm · 0.35mm/px · z∈[-88,+91]mm · 8 of 33 slices shown (2 of 2)]
[im 1/33]
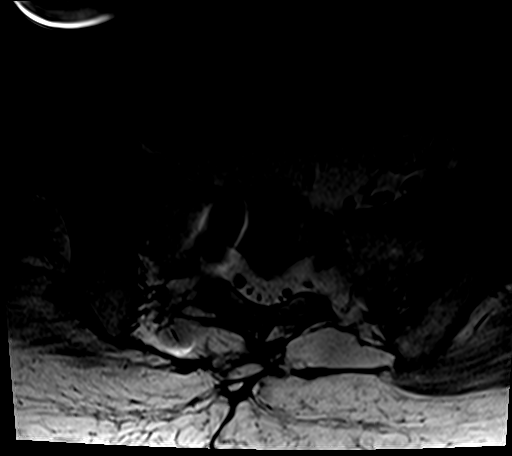
[im 4/33]
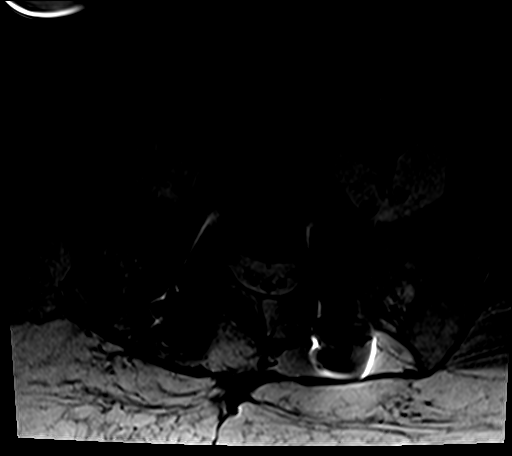
[im 11/33]
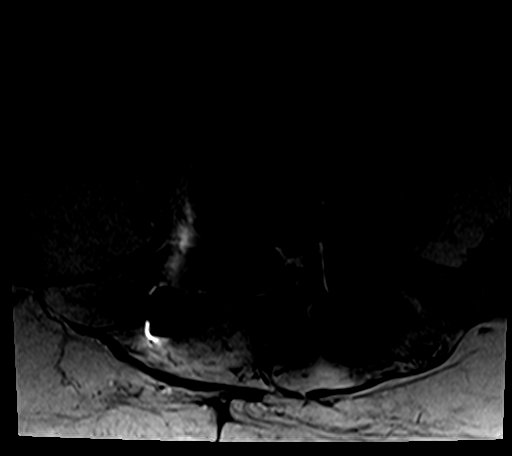
[im 15/33]
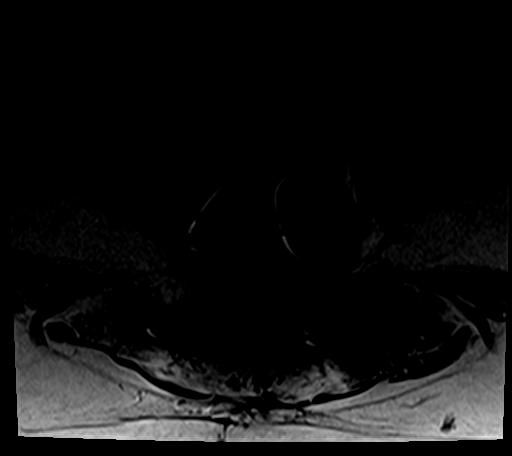
[im 18/33]
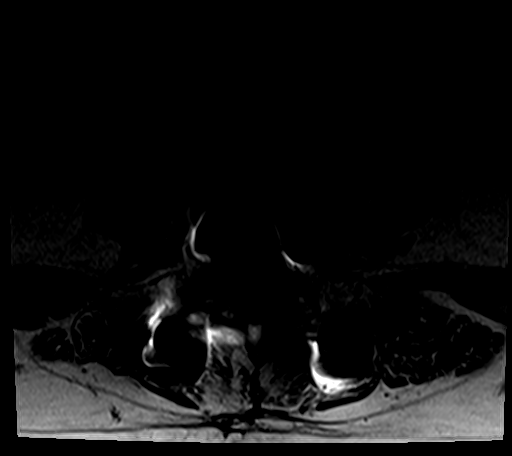
[im 22/33]
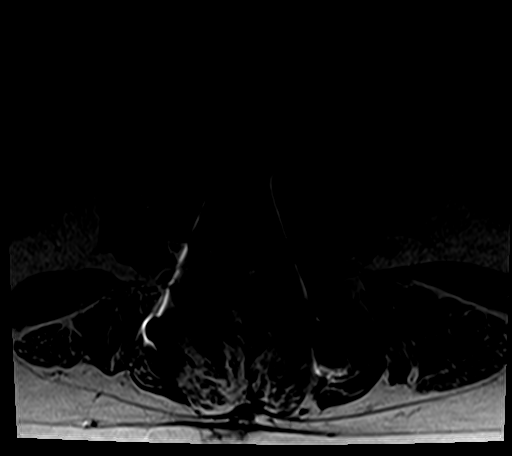
[im 29/33]
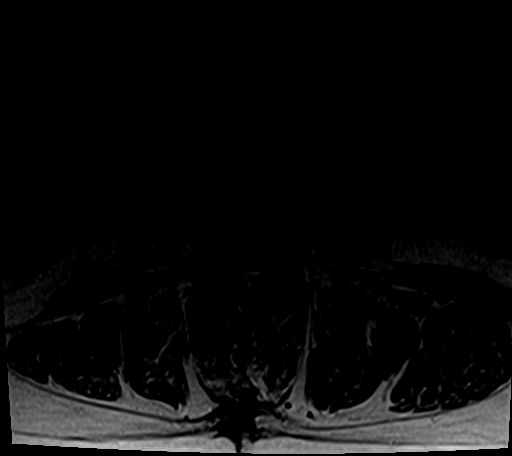
[im 33/33]
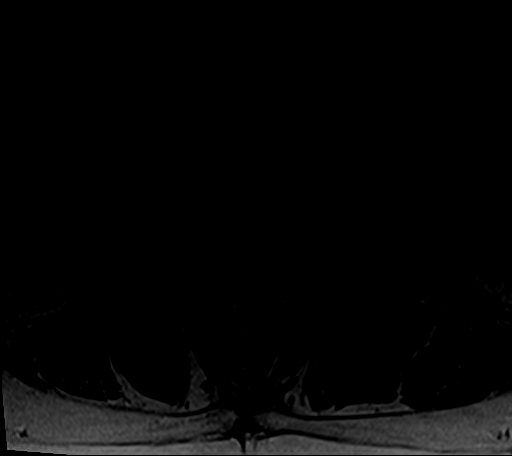

[Series 7: T2 · sagittal · 4.0mm · 0.55mm/px · 4 of 13 slices shown (2 of 2)]
[im 1/13]
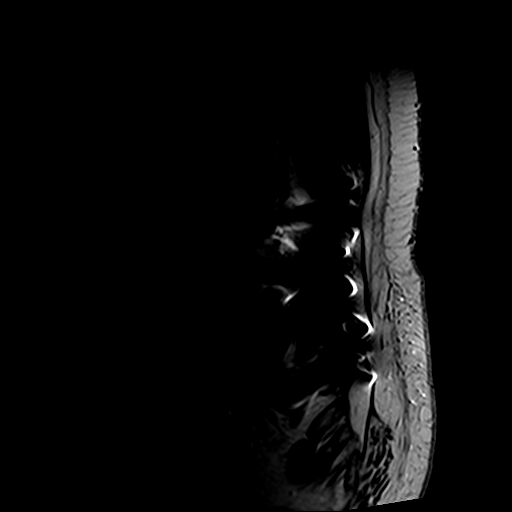
[im 5/13]
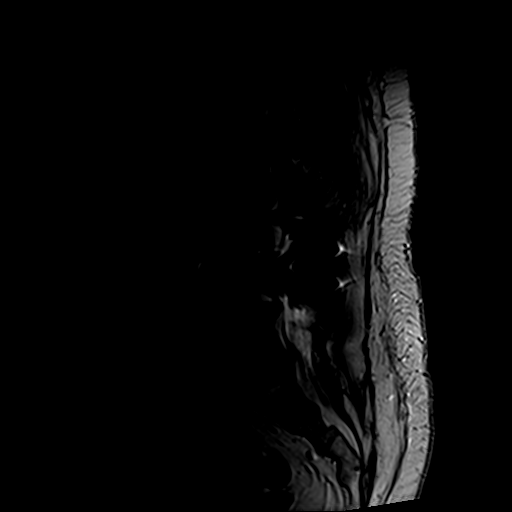
[im 9/13]
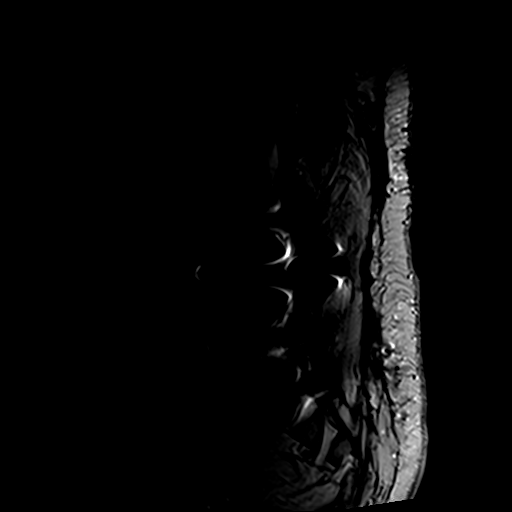
[im 13/13]
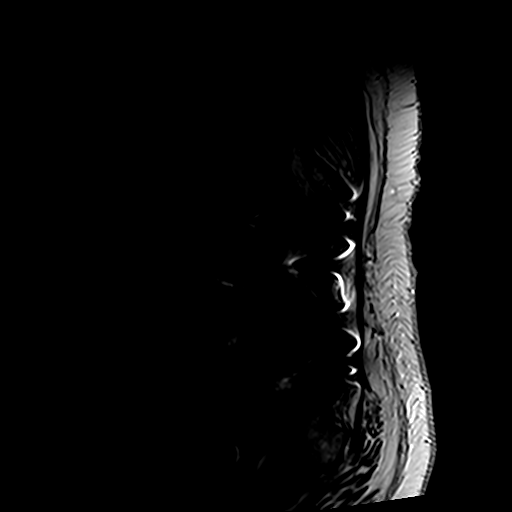

[25 of 48 positions shown; findings below may reference images not displayed]

FINDINGS: Segmentation:  Normal segmentation.

Alignment: Mild anterior slip L1-2 unchanged. Slight anterior slip
L2-3 unchanged. Remaining alignment normal.

Vertebrae: Negative for fracture or mass. Artifact related to
pedicle screws extending bilaterally at L3 through S1. Both pedicle
screws are fractured in the vertebral body at L4, unchanged from the
prior studies. No enhancing mass lesion is identified.

Conus medullaris: Extends to the T12-L1 level and appears normal.

Paraspinal and other soft tissues: Paraspinous muscle atrophy. No
fluid collection. No retroperitoneal mass

Disc levels:

T12-L1:  Mild disc bulging

L1-2: Mild anterior slip with diffuse bulging of the disc and
bilateral facet degeneration. Mild spinal stenosis unchanged.

L2-3: Mild anterior slip. Diffuse disc bulging unchanged. Moderate
facet hypertrophy bilaterally. Mild spinal stenosis unchanged from
the prior study. Mild subarticular and foraminal stenosis on the
left unchanged

L3-4: Pedicle screw and interbody fusion. Posterior decompression.
Negative for stenosis

L4-5: Pedicle screw and interbody fusion with posterior
decompression. Negative for stenosis

L5-S1: Bilateral pedicle screw fusion without interbody fusion. No
significant stenosis. No significant disc degeneration.
IMPRESSION: Pedicle screw fusion L3 through S1 without spinal stenosis

Mild anterior slip at L1-2 and L2-3. Both levels contain disc and
facet degeneration with mild spinal stenosis. No change from
10/01/2014.

## 2018-07-17 ENCOUNTER — Other Ambulatory Visit: Payer: Self-pay | Admitting: Cardiology

## 2018-07-24 DIAGNOSIS — Z79891 Long term (current) use of opiate analgesic: Secondary | ICD-10-CM | POA: Diagnosis not present

## 2018-07-24 DIAGNOSIS — M48061 Spinal stenosis, lumbar region without neurogenic claudication: Secondary | ICD-10-CM | POA: Diagnosis not present

## 2018-07-24 DIAGNOSIS — G894 Chronic pain syndrome: Secondary | ICD-10-CM | POA: Diagnosis not present

## 2018-07-24 DIAGNOSIS — E1142 Type 2 diabetes mellitus with diabetic polyneuropathy: Secondary | ICD-10-CM | POA: Diagnosis not present

## 2018-07-24 DIAGNOSIS — M48062 Spinal stenosis, lumbar region with neurogenic claudication: Secondary | ICD-10-CM | POA: Diagnosis not present

## 2018-07-25 DIAGNOSIS — G4733 Obstructive sleep apnea (adult) (pediatric): Secondary | ICD-10-CM | POA: Diagnosis not present

## 2018-07-25 DIAGNOSIS — J309 Allergic rhinitis, unspecified: Secondary | ICD-10-CM | POA: Diagnosis not present

## 2018-07-25 DIAGNOSIS — J410 Simple chronic bronchitis: Secondary | ICD-10-CM | POA: Diagnosis not present

## 2018-07-26 DIAGNOSIS — I10 Essential (primary) hypertension: Secondary | ICD-10-CM | POA: Diagnosis not present

## 2018-07-26 DIAGNOSIS — I2510 Atherosclerotic heart disease of native coronary artery without angina pectoris: Secondary | ICD-10-CM | POA: Diagnosis not present

## 2018-07-26 DIAGNOSIS — E119 Type 2 diabetes mellitus without complications: Secondary | ICD-10-CM | POA: Diagnosis not present

## 2018-08-14 ENCOUNTER — Telehealth: Payer: Self-pay | Admitting: Cardiology

## 2018-08-14 NOTE — Telephone Encounter (Signed)
Virtual Visit Pre-Appointment Phone Call  "(Name), I am calling you today to discuss your upcoming appointment. We are currently trying to limit exposure to the virus that causes COVID-19 by seeing patients at home rather than in the office."  1. "What is the BEST phone number to call the day of the visit?" - include this in appointment notes  2. Do you have or have access to (through a family member/friend) a smartphone with video capability that we can use for your visit?" a. If yes - list this number in appt notes as cell (if different from BEST phone #) and list the appointment type as a VIDEO visit in appointment notes b. If no - list the appointment type as a PHONE visit in appointment notes  3. Confirm consent - "In the setting of the current Covid19 crisis, you are scheduled for a (phone or video) visit with your provider on (date) at (time).  Just as we do with many in-office visits, in order for you to participate in this visit, we must obtain consent.  If you'd like, I can send this to your mychart (if signed up) or email for you to review.  Otherwise, I can obtain your verbal consent now.  All virtual visits are billed to your insurance company just like a normal visit would be.  By agreeing to a virtual visit, we'd like you to understand that the technology does not allow for your provider to perform an examination, and thus may limit your provider's ability to fully assess your condition. If your provider identifies any concerns that need to be evaluated in person, we will make arrangements to do so.  Finally, though the technology is pretty good, we cannot assure that it will always work on either your or our end, and in the setting of a video visit, we may have to convert it to a phone-only visit.  In either situation, we cannot ensure that we have a secure connection.  Are you willing to proceed?" STAFF: Did the patient verbally acknowledge consent to telehealth visit? Document  YES/NO here: Yes  4. Advise patient to be prepared - "Two hours prior to your appointment, go ahead and check your blood pressure, pulse, oxygen saturation, and your weight (if you have the equipment to check those) and write them all down. When your visit starts, your provider will ask you for this information. If you have an Apple Watch or Kardia device, please plan to have heart rate information ready on the day of your appointment. Please have a pen and paper handy nearby the day of the visit as well."  5. Give patient instructions for MyChart download to smartphone OR [email or web address] as below if video visit (depending on what platform provider is using)  6. Inform patient they will receive a phone call 15 minutes prior to their appointment time (may be from unknown caller ID) so they should be prepared to answer    TELEPHONE CALL NOTE  [name] [name] has been deemed a candidate for a follow-up tele-health visit to limit community exposure during the Covid-19 pandemic. I spoke with the patient via phone to ensure availability of phone/video source, confirm preferred email & phone number, and discuss instructions and expectations.  I reminded [name] [name] to be prepared with any vital sign and/or heart rhythm information that could potentially be obtained via home monitoring, at the time of her visit. I reminded [name] [name] to expect a phone call prior to  her visit.  Terry L Goins 08/14/2018 11:10 AM

## 2018-08-16 ENCOUNTER — Telehealth: Payer: Self-pay | Admitting: Cardiology

## 2018-08-16 ENCOUNTER — Other Ambulatory Visit: Payer: Self-pay

## 2018-08-16 ENCOUNTER — Emergency Department (HOSPITAL_COMMUNITY): Payer: Medicare Other

## 2018-08-16 ENCOUNTER — Emergency Department (HOSPITAL_COMMUNITY)
Admission: EM | Admit: 2018-08-16 | Discharge: 2018-08-17 | Disposition: A | Discharge status: Home / Self Care | Payer: Medicare Other | Attending: Emergency Medicine | Admitting: Emergency Medicine

## 2018-08-16 ENCOUNTER — Encounter (HOSPITAL_COMMUNITY): Payer: Self-pay | Admitting: Emergency Medicine

## 2018-08-16 DIAGNOSIS — Z79899 Other long term (current) drug therapy: Secondary | ICD-10-CM | POA: Diagnosis not present

## 2018-08-16 DIAGNOSIS — R0602 Shortness of breath: Secondary | ICD-10-CM

## 2018-08-16 DIAGNOSIS — R531 Weakness: Secondary | ICD-10-CM | POA: Insufficient documentation

## 2018-08-16 DIAGNOSIS — J449 Chronic obstructive pulmonary disease, unspecified: Secondary | ICD-10-CM | POA: Insufficient documentation

## 2018-08-16 DIAGNOSIS — R5383 Other fatigue: Secondary | ICD-10-CM | POA: Insufficient documentation

## 2018-08-16 DIAGNOSIS — Z7901 Long term (current) use of anticoagulants: Secondary | ICD-10-CM | POA: Insufficient documentation

## 2018-08-16 DIAGNOSIS — J441 Chronic obstructive pulmonary disease with (acute) exacerbation: Secondary | ICD-10-CM | POA: Diagnosis not present

## 2018-08-16 DIAGNOSIS — E119 Type 2 diabetes mellitus without complications: Secondary | ICD-10-CM | POA: Diagnosis not present

## 2018-08-16 DIAGNOSIS — Z87891 Personal history of nicotine dependence: Secondary | ICD-10-CM | POA: Insufficient documentation

## 2018-08-16 DIAGNOSIS — Z20828 Contact with and (suspected) exposure to other viral communicable diseases: Secondary | ICD-10-CM | POA: Insufficient documentation

## 2018-08-16 DIAGNOSIS — I10 Essential (primary) hypertension: Secondary | ICD-10-CM | POA: Diagnosis not present

## 2018-08-16 DIAGNOSIS — Z7984 Long term (current) use of oral hypoglycemic drugs: Secondary | ICD-10-CM | POA: Insufficient documentation

## 2018-08-16 LAB — CBC
HCT: 37.3 % (ref 36.0–46.0)
Hemoglobin: 11.7 g/dL — ABNORMAL LOW (ref 12.0–15.0)
MCH: 27 pg (ref 26.0–34.0)
MCHC: 31.4 g/dL (ref 30.0–36.0)
MCV: 86.1 fL (ref 80.0–100.0)
Platelets: 380 10*3/uL (ref 150–400)
RBC: 4.33 MIL/uL (ref 3.87–5.11)
RDW: 14.1 % (ref 11.5–15.5)
WBC: 13.1 10*3/uL — ABNORMAL HIGH (ref 4.0–10.5)
nRBC: 0 % (ref 0.0–0.2)

## 2018-08-16 LAB — BASIC METABOLIC PANEL
Anion gap: 13 (ref 5–15)
BUN: 21 mg/dL — ABNORMAL HIGH (ref 6–20)
CO2: 27 mmol/L (ref 22–32)
Calcium: 9.7 mg/dL (ref 8.9–10.3)
Chloride: 97 mmol/L — ABNORMAL LOW (ref 98–111)
Creatinine, Ser: 0.65 mg/dL (ref 0.44–1.00)
GFR calc Af Amer: >60 mL/min (ref >60)
GFR calc non Af Amer: >60 mL/min (ref >60)
Glucose, Bld: 92 mg/dL (ref 70–99)
Potassium: 3.5 mmol/L (ref 3.5–5.1)
Sodium: 137 mmol/L (ref 135–145)

## 2018-08-16 LAB — SARS CORONAVIRUS 2 BY RT PCR (HOSPITAL ORDER, PERFORMED IN ~~LOC~~ HOSPITAL LAB): SARS Coronavirus 2: NEGATIVE

## 2018-08-16 LAB — BRAIN NATRIURETIC PEPTIDE: B Natriuretic Peptide: 25 pg/mL (ref 0.0–100.0)

## 2018-08-16 LAB — TROPONIN I (HIGH SENSITIVITY)
Troponin I (High Sensitivity): 3 ng/L (ref <18)
Troponin I (High Sensitivity): 3 ng/L (ref <18)

## 2018-08-16 MED ORDER — ALBUTEROL (5 MG/ML) CONTINUOUS INHALATION SOLN
10.0000 mg/h | INHALATION_SOLUTION | Freq: Once | RESPIRATORY_TRACT | Status: AC
  Administered 2018-08-16: 10 mg/h via RESPIRATORY_TRACT
  Filled 2018-08-16: qty 20

## 2018-08-16 MED ORDER — IPRATROPIUM BROMIDE 0.02 % IN SOLN
1.0000 mg | Freq: Once | RESPIRATORY_TRACT | Status: AC
  Administered 2018-08-16: 1 mg via RESPIRATORY_TRACT
  Filled 2018-08-16: qty 5

## 2018-08-16 MED ORDER — METHYLPREDNISOLONE SODIUM SUCC 125 MG IJ SOLR
125.0000 mg | Freq: Once | INTRAMUSCULAR | Status: AC
  Administered 2018-08-16: 23:00:00 125 mg via INTRAVENOUS
  Filled 2018-08-16: qty 2

## 2018-08-16 MED ORDER — ALBUTEROL SULFATE HFA 108 (90 BASE) MCG/ACT IN AERS
8.0000 | INHALATION_SPRAY | Freq: Once | RESPIRATORY_TRACT | Status: AC
  Administered 2018-08-16: 8 via RESPIRATORY_TRACT
  Filled 2018-08-16: qty 6.7

## 2018-08-16 NOTE — Telephone Encounter (Signed)
Patient called stating that she continues to have shortness of breath - fatigue - weak

## 2018-08-16 NOTE — ED Provider Notes (Addendum)
[facility] EMERGENCY DEPARTMENT Provider Note   CSN: [id number] Arrival date & time: 08/16/18  1721     History   Chief Complaint Chief Complaint  Patient presents with   Shortness of Breath    HPI [name] [name] is a 58 y.o. female.     HPI Pt was seen at [place].  Per pt, c/o gradual onset and worsening of persistent SOB for the past 1 week. Has been associated with one episode of CP yesterday, relieved with SL ntg x1. Pt states she has been walking shorter and shorter distances due to feeling SOB. Pt states she has been taking her diuretics as prescribed and weighed "321 lbs" this morning ("down from 330 lbs"). Pt endorses hx COPD, but has not been using her neb machine and only used her MDI "once" yesterday. Has been associated with generalized weakness/fatigue. Denies palpitations, no cough, no fevers, no rash, no abd pain, no N/V/D, no focal motor weakness.    Past Medical History:  Diagnosis Date   Bladder dysfunction    Self urinary catheterization   COPD (chronic obstructive pulmonary disease) (HCC)    DDD (degenerative disc disease)    Depression    Esophageal spasm    NTG and Norvasc   Essential hypertension, benign    Fibromyalgia    Gastroparesis    GERD (gastroesophageal reflux disease)    History of cardiac catheterization    Normal coronary arteries 11/12   History of pituitary tumor    Irritable bowel syndrome    Lymphedema    Osteoarthritis    PAF (paroxysmal atrial fibrillation) (HCC)    Sleep apnea    BIPAP   Type 2 diabetes mellitus ([place])    Urinary tract infection     Patient Active Problem List   Diagnosis Date Noted   Therapeutic opioid-induced constipation (OIC) 01/30/2018   Diarrhea 10/24/2017   Taking multiple medications for chronic disease 11/01/2016   History of adenomatous polyp of colon 11/01/2016   Chest pain 09/15/2015   Fibromyalgia    Chronic obstructive pulmonary disease (HCC)    Constipation  08/16/2015   Nausea without vomiting 03/25/2015   GERD (gastroesophageal reflux disease) 03/25/2015   Depression 01/05/2015   Cervical spondylosis with radiculopathy 12/02/2014   Rectal bleeding 08/22/2013   History of melena 06/05/2013   Irritable bowel syndrome 06/05/2013   Precordial pain 02/06/2012   Paroxysmal atrial fibrillation ([place]) 09/28/2011   Essential hypertension, benign 09/28/2011   Obstructive sleep apnea 10/02/2007   COPD 10/02/2007   Gastroparesis due to DM ([place]) 10/02/2007    Past Surgical History:  Procedure Laterality Date   ABDOMINAL HYSTERECTOMY     ANTERIOR CERVICAL DECOMP/DISCECTOMY FUSION N/A 12/02/2014   Procedure: Cervical five cervical six anterior cervical decompression with fusion interbody prosthesis plating and bone graft;  Surgeon: Newman Pies, MD;  Location: [place] NEURO ORS;  Service: Neurosurgery;  Laterality: N/A;  C56 anterior cervical decompression with fusion interbody prosthesis plating and bonegraft   APPENDECTOMY     BACK SURGERY     CARDIAC CATHETERIZATION     CARDIOVERSION N/A 03/26/2014   Procedure: CARDIOVERSION;  Surgeon: Herminio Commons, MD;  Location: [facility];  Service: Endoscopy;  Laterality: N/A;   CARDIOVERSION N/A 05/04/2014   Procedure: CARDIOVERSION;  Surgeon: Satira Sark, MD;  Location: [facility] ORS;  Service: Cardiovascular;  Laterality: N/A;   CHOLECYSTECTOMY     COLONOSCOPY     Approximately 2003. Per medical records, internal hemorrhoids noted   COLONOSCOPY N/A  06/25/2013   Dr. Gala Romney: Anal canal hemorrhoids-more likely the source of paper hematochezia. Redundant, capacious colon. Multiple colonic polyps-tubular adenoma   COLONOSCOPY WITH PROPOFOL N/A 12/04/2016   Dr. Gala Romney: redundant colon, colonic diverticulosis. Screening due in 2023   ENDOVENOUS ABLATION SAPHENOUS VEIN W/ LASER Left 03/07/2018   endovenous ablation left greater saphenous vein by Deitra Mayo MD     ESOPHAGOGASTRODUODENOSCOPY  10/12/2004   PXT:GGYI plaquing on the esophageal mucosa of uncertain significance, not typical of what is seen with candida esophagitis status post KOH brushing for KOH prep and biopsy for histology. Rule out candida esophagitis/eosinophilic esophagitis. Otherwise normal esophagus. Tiny hiatal hernia. Otherwise, normal stomach, normal D1 and D2. Benign biopsy of esophagus, unknown KOH status.    ESOPHAGOGASTRODUODENOSCOPY N/A 06/25/2013   Dr. Rourk:mild chronic gastritis   KNEE SURGERY     LEFT HEART CATHETERIZATION WITH CORONARY ANGIOGRAM N/A 11/15/2010   Procedure: LEFT HEART CATHETERIZATION WITH CORONARY ANGIOGRAM;  Surgeon: Laverda Page, MD;  Location: [facility] CATH LAB;  Service: Cardiovascular;  Laterality: N/A;   LUNG BIOPSY       OB History   No obstetric history on file.      Home Medications    Prior to Admission medications   Medication Sig Start Date End Date Taking? Authorizing Provider  albuterol (PROAIR HFA) 108 (90 BASE) MCG/ACT inhaler Inhale 2 puffs into the lungs every 6 (six) hours as needed for wheezing or shortness of breath.     [provider]  albuterol (PROVENTIL) (2.5 MG/3ML) 0.083% nebulizer solution Take 2.5 mg by nebulization every 6 (six) hours as needed for wheezing. For shortness of breath.    [provider]  atorvastatin (LIPITOR) 10 MG tablet Take 10 mg by mouth at bedtime. 03/12/15   [provider]  baclofen (LIORESAL) 20 MG tablet Take 20 mg by mouth 3 (three) times daily.      [provider]  buPROPion (WELLBUTRIN) 75 MG tablet Take 1 tablet (75 mg total) by mouth daily. 06/01/15 11/15/18  Cloria Spring, MD  CONSTULOSE 10 GM/15ML solution TAKE 15MLS BY MOUTH DAILY 02/06/18   Annitta Needs, NP  diclofenac sodium (VOLTAREN) 1 % GEL Apply 2-4 g topically 4 (four) times daily as needed (for pain).     [provider]  diltiazem (CARDIZEM CD) 120 MG 24 hr capsule TAKE ONE CAPSULE BY  MOUTH EVERY DAY 07/18/18   Satira Sark, MD  docusate sodium (COLACE) 100 MG capsule Take 100 mg by mouth 2 (two) times daily.    [provider]  famotidine (PEPCID) 20 MG tablet Take 1 tablet (20 mg total) by mouth daily as needed for heartburn or indigestion. 05/29/18   Erenest Rasher, PA-C  flecainide (TAMBOCOR) 50 MG tablet TAKE TWO TABLETS BY MOUTH EVERY MORNING and TAKE TWO TABLETS BY MOUTH EVERY EVENING 04/08/18   Satira Sark, MD  fluticasone [facility]) 50 MCG/ACT nasal spray Place 2 sprays into both nostrils daily. 08/31/16   [provider]  furosemide (LASIX) 80 MG tablet Take 80 mg by mouth 2 (two) times daily.      [provider]  gabapentin (NEURONTIN) 800 MG tablet Take 1 tablet by mouth 4 (four) times daily. 10/09/17   [provider]  glimepiride (AMARYL) 2 MG tablet Take 4 mg by mouth 2 (two) times daily.  05/21/15   [provider]  hydrocortisone (ANUSOL-HC) 2.5 % rectal cream Place 1 application rectally 2 (two) times daily. Patient taking  differently: Place 1 application rectally as needed.  10/24/17   Annitta Needs, NP  Insulin Degludec (TRESIBA [place]) Inject 46 Units into the skin at bedtime.     [provider]  isosorbide mononitrate (IMDUR) 120 MG 24 hr tablet Take 120 mg by mouth every morning.     [provider]  isosorbide mononitrate (IMDUR) 30 MG 24 hr tablet Take 30 mg by mouth every evening.     [provider]  LINZESS 290 MCG CAPS capsule TAKE ONE CAPSULE BY MOUTH EVERY DAY BEFORE BREAKFAST - REPLACES [place] 05/08/18   Mahala Menghini, PA-C  lisinopril (PRINIVIL,ZESTRIL) 10 MG tablet Take 10 mg by mouth every evening.     [provider]  loratadine (CLARITIN) 10 MG tablet Take 10 mg by mouth daily.      [provider]  meclizine (ANTIVERT) 25 MG tablet Take 25 mg by mouth 3 (three) times daily as needed for dizziness.  07/31/13   [provider]  metFORMIN  (GLUCOPHAGE) 500 MG tablet Take 1,000 mg by mouth 2 (two) times daily.  05/18/13   [provider]  metolazone (ZAROXOLYN) 2.5 MG tablet Take 2.5 mg by mouth daily.    [provider]  Multiple Vitamin (MULTIVITAMIN WITH MINERALS) TABS tablet Take 1 tablet by mouth daily.    [provider]  NEXIUM 40 MG capsule TAKE ONE CAPSULE BY MOUTH EVERY DAY BEFORE BREAKFAST (seven IN THE MORNING PER pt) 02/06/18   Annitta Needs, NP  nitroGLYCERIN (NITROLINGUAL) 0.4 MG/SPRAY spray USE 1 SPRAY UNDER THE TONGUE AS NEEDED FOR CHEST PAIN - PA SENT 11/28/2016- JB 06/10/18   Satira Sark, MD  NUCYNTA ER 200 MG TB12 Take 200 mg by mouth every 12 (twelve) hours.  12/05/15   [provider]  nystatin (MYCOSTATIN/NYSTOP) 100000 UNIT/GM POWD Apply 1 g topically daily.     [provider]  omega-3 acid ethyl esters (LOVAZA) 1 g capsule Take 1 g by mouth 4 (four) times daily.  09/13/15   [provider]  ondansetron (ZOFRAN) 4 MG tablet TAKE 4 MG BY MOUTH EVERY 8 HOURS AS NEEDED FOR NAUSEA OR VOMITING 05/29/17   Annitta Needs, NP  oxyCODONE-acetaminophen (PERCOCET) 10-325 MG per tablet Take 1 tablet by mouth every 6 (six) hours as needed for pain.     [provider]  polyethylene glycol powder (GLYCOLAX/MIRALAX) powder Take 34 g by mouth daily.  08/25/13   [provider]  potassium chloride SA (K-DUR,KLOR-CON) 20 MEQ tablet Take 40 mEq by mouth 5 (five) times daily.     [provider]  Prucalopride Succinate (MOTEGRITY) 2 MG TABS Take 2 mg by mouth daily. 06/10/18   Erenest Rasher, PA-C  Rivaroxaban (XARELTO) 20 MG TABS Take 20 mg by mouth every evening.     [provider]  Semaglutide,0.25 or 0.5MG /DOS, (OZEMPIC, 0.25 OR 0.5 MG/DOSE,) 2 MG/1.5ML SOPN Inject 0.25 mg into the skin once a week.    [provider]  spironolactone (ALDACTONE) 50 MG tablet Take 50 mg by mouth 3 (three) times daily.      [provider]     Family History Family History  Problem Relation Age of Onset   Coronary artery disease Father        Died age 88   Heart attack Father    Arrhythmia Father        AF   Diabetes Father    Parkinson's disease Father  Arrhythmia Mother        AF   Stroke Mother    Dementia Mother    Cancer Mother        UTERINE    Parkinson's disease Mother    Arrhythmia Brother        AF   Colon cancer Maternal Grandfather    Colon cancer Paternal Aunt    Colon cancer Paternal 61    Diabetes Son    Cancer Sister        BREAST    Depression Sister    Depression Sister    Depression Sister     Social History Social History   Tobacco Use   Smoking status: Former Smoker    Packs/day: 1.50    Years: 30.00    Pack years: 45.00    Types: Cigarettes    Start date: 03/06/1977    Quit date: 01/03/2007    Years since quitting: 11.6   Smokeless tobacco: Never Used  Substance Use Topics   Alcohol use: No    Alcohol/week: 0.0 standard drinks   Drug use: No     Allergies   Levofloxacin, Tape, Hyoscyamine sulfate, and Metoclopramide   Review of Systems Review of Systems ROS: Statement: All systems negative except as marked or noted in the HPI; Constitutional: Negative for fever and chills. ; ; Eyes: Negative for eye pain, redness and discharge. ; ; ENMT: Negative for ear pain, hoarseness, nasal congestion, sinus pressure and sore throat. ; ; Cardiovascular: +SOB, CP. Negative for palpitations, diaphoresis, and peripheral edema. ; ; Respiratory: Negative for cough, wheezing and stridor. ; ; Gastrointestinal: Negative for nausea, vomiting, diarrhea, abdominal pain, blood in stool, hematemesis, jaundice and rectal bleeding. . ; ; Genitourinary: Negative for dysuria, flank pain and hematuria. ; ; Musculoskeletal: Negative for back pain and neck pain. Negative for swelling and trauma.; ; Skin: Negative for pruritus, rash, abrasions, blisters, bruising and skin lesion.; ;  Neuro: Negative for headache, lightheadedness and neck stiffness. Negative for weakness, altered level of consciousness, altered mental status, extremity weakness, paresthesias, involuntary movement, seizure and syncope.       Physical Exam Updated Vital Signs BP (!) 117/56    Pulse 71    Temp 98.9 F (37.2 C) (Oral)    Resp 15    Ht 5\' 9"  (1.753 m)    Wt (!) 144.8 kg    SpO2 100%    BMI 47.15 kg/m     20:09:38 Orthostatic Vital Signs HC  Orthostatic Lying   BP- Lying: 115/62  Pulse- Lying: 72      Orthostatic Sitting  BP- Sitting: 115/54  Pulse- Sitting: 72      Orthostatic Standing at 0 minutes  BP- Standing at 0 minutes: 120/61  Pulse- Standing at 0 minutes: 70     Physical Exam 2000: Physical examination:  Nursing notes reviewed; Vital signs and O2 SAT reviewed;  Constitutional: Well developed, Well nourished, Well hydrated, In no acute distress; Head:  Normocephalic, atraumatic; Eyes: EOMI, PERRL, No scleral icterus; ENMT: Mouth and pharynx normal, Mucous membranes moist; Neck: Supple, Full range of motion, No lymphadenopathy; Cardiovascular: Regular rate and rhythm, No gallop; Respiratory: Breath sounds diminished & equal bilaterally, No wheezes.  Speaking full sentences with ease, Normal respiratory effort/excursion; Chest: Nontender, Movement normal; Abdomen: Soft, Nontender, Nondistended, Normal bowel sounds; Genitourinary: No CVA tenderness; Extremities: Peripheral pulses normal, No tenderness, +1 pedal edema bilat, wearing compression hose. No calf asymmetry.; Neuro: AA&Ox3, Major CN grossly intact.  Speech clear.  No gross focal motor or sensory deficits in extremities.; Skin: Color normal, Warm, Dry.     ED Treatments / Results  Labs (all labs ordered are listed, but only abnormal results are displayed)   EKG EKG Interpretation  Date/Time:  Friday August 16 2018 20:07:06 EDT Ventricular Rate:  72 PR Interval:    QRS Duration: 120 QT Interval:  421 QTC  Calculation: 461 R Axis:   -37 Text Interpretation:  Sinus rhythm Prolonged PR interval Nonspecific IVCD with LAD LVH with secondary repolarization abnormality Lateral infarct, old When compared with ECG of 09/15/2015 No significant change was found Confirmed by Francine Graven [phone]) on 08/16/2018 8:18:31 PM   Radiology   Procedures Procedures (including critical care time)  Medications Ordered in ED Medications  albuterol (VENTOLIN HFA) 108 (90 Base) MCG/ACT inhaler 8 puff (has no administration in time range)     Initial Impression / Assessment and Plan / ED Course  I have reviewed the triage vital signs and the nursing notes.  Pertinent labs & imaging results that were available during my care of the patient were reviewed by me and considered in my medical decision making (see chart for details).     MDM Reviewed: previous chart, nursing note and vitals Reviewed previous: labs and ECG Interpretation: labs, ECG and x-ray Total time providing critical care: 30-74 minutes. This excludes time spent performing separately reportable procedures and services.  CRITICAL CARE Performed by: Francine Graven Total critical care time: 35 minutes Critical care time was exclusive of separately billable procedures and treating other patients. Critical care was necessary to treat or prevent imminent or life-threatening deterioration. Critical care was time spent personally by me on the following activities: development of treatment plan with patient and/or surrogate as well as nursing, discussions with consultants, evaluation of patient's response to treatment, examination of patient, obtaining history from patient or surrogate, ordering and performing treatments and interventions, ordering and review of laboratory studies, ordering and review of radiographic studies, pulse oximetry and re-evaluation of patient's condition.    Results for orders placed or performed during the hospital  encounter of 08/16/18  SARS Coronavirus 2 [facility] order, Performed in [facility] hospital lab) Nasopharyngeal Nasopharyngeal Swab   Specimen: Nasopharyngeal Swab  Result Value Ref Range   SARS Coronavirus 2 NEGATIVE NEGATIVE  Basic metabolic panel  Result Value Ref Range   Sodium 137 135 - 145 mmol/L   Potassium 3.5 3.5 - 5.1 mmol/L   Chloride 97 (L) 98 - 111 mmol/L   CO2 27 22 - 32 mmol/L   Glucose, Bld 92 70 - 99 mg/dL   BUN 21 (H) 6 - 20 mg/dL   Creatinine, Ser 0.65 0.44 - 1.00 mg/dL   Calcium 9.7 8.9 - 10.3 mg/dL   GFR calc non Af Amer >60 >60 mL/min   GFR calc Af Amer >60 >60 mL/min   Anion gap 13 5 - 15  CBC  Result Value Ref Range   WBC 13.1 (H) 4.0 - 10.5 K/uL   RBC 4.33 3.87 - 5.11 MIL/uL   Hemoglobin 11.7 (L) 12.0 - 15.0 g/dL   HCT 37.3 36.0 - 46.0 %   MCV 86.1 80.0 - 100.0 fL   MCH 27.0 26.0 - 34.0 pg   MCHC 31.4 30.0 - 36.0 g/dL   RDW 14.1 11.5 - 15.5 %   Platelets 380 150 - 400 K/uL   nRBC 0.0 0.0 - 0.2 %  Brain natriuretic peptide  Result Value Ref Range   B  Natriuretic Peptide 25.0 0.0 - 100.0 pg/mL  Troponin I (High Sensitivity)  Result Value Ref Range   Troponin I (High Sensitivity) 3 <18 ng/L  Troponin I (High Sensitivity)  Result Value Ref Range   Troponin I (High Sensitivity) 3 <18 ng/L    Dg Chest Port 1 View Result Date: 08/16/2018 CLINICAL DATA:  Shortness of breath. Patient reports increased shortness of breath with activity for the past week. EXAM: PORTABLE CHEST 1 VIEW COMPARISON:  Chest radiograph 09/15/2015 FINDINGS: The cardiomediastinal contours are normal. Chronic hyperinflation and bronchial thickening, unchanged. Mild biapical pleuroparenchymal scarring, stable. Pulmonary vasculature is normal. No consolidation, pleural effusion, or pneumothorax. No acute osseous abnormalities are seen. IMPRESSION: Chronic hyperinflation and bronchial thickening, unchanged from prior exam. No new abnormality. Electronically Signed   By: Keith Rake  M.D.   On: 08/16/2018 20:24    [name] [name] was evaluated in Emergency Department on 08/16/2018 for the symptoms described in the history of present illness. She was evaluated in the context of the global COVID-19 pandemic, which necessitated consideration that the patient might be at risk for infection with the SARS-CoV-2 virus that causes COVID-19. Institutional protocols and algorithms that pertain to the evaluation of patients at risk for COVID-19 are in a state of rapid change based on information released by regulatory bodies including the CDC and federal and state organizations. These policies and algorithms were followed during the patient's care in the ED.   2030:  Weight today 319 lbs.  Pt not orthostatic on VS. Pt ambulated with steady gait, easy resps, NAD with Sats remaining 100% R/A.   2310:  BNP normal and no pulmonary vascular congestion on CXR; doubt CHF as cause for SOB. Pt endorses compliance with xarelto; doubt PE. COVID negative. Will tx for likely COPD exacerbation with IV solumedrol and hour long neb. Pt will also need to ambulate with O2 Sat after neb. Dispo based on results. Sign out to Dr. Wyvonnia Dusky.      Final Clinical Impressions(s) / ED Diagnoses   Final diagnoses:  SOB (shortness of breath)    ED Discharge Orders    None           Francine Graven, DO 08/16/18 2322

## 2018-08-16 NOTE — Telephone Encounter (Signed)
Pt called c/o increased SOB. She states SOB increases with activity or heat. Current wt today is 321 lb and BP is 135/66 HR 73. Upon ending call pt reports that she did have chest pain on yesterday that was relieved with one nitro. No current chest pain at this time. Please advise.

## 2018-08-16 NOTE — ED Triage Notes (Signed)
Increased sob with activity x past week, told by pcp to come to ED

## 2018-08-16 NOTE — Telephone Encounter (Signed)
Pt notified and voiced understanding 

## 2018-08-16 NOTE — ED Notes (Signed)
Pt ambulated w/pulse ox, remained at 100%, HR in high 80s, no complaints of SOB, steady gait w/cane

## 2018-08-16 NOTE — Telephone Encounter (Signed)
    Covering for Dr. Domenic Polite. Her weight was 328 lbs at the time of her office visit in 06/2018 so if now at 321 lbs, this has actually improved. Please make sure she has been compliant with Lasix 80mg  BID and Spironolactone. If she is having worsening dyspnea, chest pain, and weakness would recommend Emergency Dept evaluation.   Signed, Erma Heritage, PA-C 08/16/2018, 2:52 PM Pager: [phone]

## 2018-08-17 ENCOUNTER — Telehealth (HOSPITAL_COMMUNITY): Payer: Self-pay | Admitting: Emergency Medicine

## 2018-08-17 LAB — D-DIMER, QUANTITATIVE: D-Dimer, Quant: 0.31 ug/mL-FEU (ref 0.00–0.50)

## 2018-08-17 MED ORDER — DOXYCYCLINE HYCLATE 100 MG PO CAPS: 100.0000 mg | ORAL_CAPSULE | Freq: Two times a day (BID) | ORAL | 0 refills | Status: DC

## 2018-08-17 MED ORDER — PREDNISONE 50 MG PO TABS: ORAL_TABLET | ORAL | 0 refills | Status: DC

## 2018-08-17 NOTE — Discharge Instructions (Signed)
Take the steroids as prescribed.  Watch your blood sugars closely as they could increase on the steroids.  Use your albuterol every 4-6 hours as needed.  Return to the ED with difficulty breathing, chest pain or other concerns.

## 2018-08-17 NOTE — Telephone Encounter (Signed)
Doxycycline added to discharge medications.

## 2018-08-17 NOTE — ED Provider Notes (Signed)
Care assumed from Dr. Thurnell Garbe.  Patient awaiting second troponin and ambulation with pulse ox.  She was seen with shortness of breath for the past 1 week as well as generalized weakness.  No cough.  She is able to ambulate maintain saturations 100%.  Denies chest pain or shortness of breath.  Troponin negative x2.  D-dimer negative.  Patient able to ambulate without desaturation.  She reports her breathing has improved.  Her lungs are clear.  Low suspicion for ACS or PE.  D-dimer is negative.  States compliance with Xarelto.  No evidence of volume overload on x-ray and BNP normal.  Patient was treated for likely COPD exacerbation with steroids and bronchodilators.  Advised to watch her blood sugars closely while she is taking the steroids.  Follow-up with PCP.  Return precautions discussed.  BP (!) 103/55   Pulse 88   Temp 98.9 F (37.2 C) (Oral)   Resp 17   Ht 5\' 9"  (1.753 m)   Wt (!) 144.8 kg   SpO2 96%   BMI 47.15 kg/m      Ezequiel Essex, MD 08/17/18 [phone]

## 2018-08-17 NOTE — ED Notes (Signed)
pt ambulated without assistance, steady gait, denies SOB, O2 sats maintained 98-100%

## 2018-08-17 NOTE — ED Notes (Signed)
Ambulated around nurse station. Tolerated well.  o2 sats remained at 100%. Did not c/o any cp, sob, dizziness.   When back in room, pt stated she started to feel tightness in chest and sob. o2 sats remained at 100%.

## 2018-08-21 DIAGNOSIS — G894 Chronic pain syndrome: Secondary | ICD-10-CM | POA: Diagnosis not present

## 2018-08-21 DIAGNOSIS — Z79891 Long term (current) use of opiate analgesic: Secondary | ICD-10-CM | POA: Diagnosis not present

## 2018-08-21 DIAGNOSIS — M48062 Spinal stenosis, lumbar region with neurogenic claudication: Secondary | ICD-10-CM | POA: Diagnosis not present

## 2018-08-21 DIAGNOSIS — E1142 Type 2 diabetes mellitus with diabetic polyneuropathy: Secondary | ICD-10-CM | POA: Diagnosis not present

## 2018-08-21 NOTE — H&P (View-Only) (Signed)
Virtual Visit via Video Note   This visit type was conducted due to national recommendations for restrictions regarding the COVID-19 Pandemic (e.g. social distancing) in an effort to limit this patient's exposure and mitigate transmission in our community.  Due to her co-morbid illnesses, this patient is at least at moderate risk for complications without adequate follow up.  This format is felt to be most appropriate for this patient at this time.  All issues noted in this document were discussed and addressed.  A limited physical exam was performed with this format.  Please refer to the patient's chart for her consent to telehealth for [facility].   Date:  08/22/2018   ID:  [name] [name], DOB [birth year], MRN [id number]  Patient Location: Home Provider Location: Home  PCP:  Glenda Chroman, MD  Cardiologist:  Rozann Lesches, MD Electrophysiologist:  None   Evaluation Performed:  Follow-Up Visit  Chief Complaint:   Cardiac follow-up  History of Present Illness:    [name] [name] is a 58 y.o. female last seen in June by Ms. Barrett PA-C.  I reviewed her last note and interval records.  She was actually just recently seen in the ER reporting shortness of breath.  ECG showed no acute ST segment changes.  High-sensitivity troponin I levels were negative and d-dimer was negative.  SARS coronavirus 2 test was also negative.  She was treated with steroids and bronchodilators for possible pulmonary process.  X-ray showed hyperinflation and bronchial thickening.  We discussed her symptoms.  She tells me that the recent treatment in the ER was not effective.  Describes progressive dyspnea on exertion over the last several months.  She gets short of breath getting dressed, making her bed, other basic ADLs.  Feeling of chest tightness at the time.  She also has COPD and follows with Dr. Marina Gravel in [place].  She does not necessarily feel wheezing with her shortness of breath.  Last ischemic  evaluation was via cardiac catheterization in 2012 at which time she had normal coronary arteries.  She does have active cardiac risk factors including obesity, hypertension, and type 2 diabetes mellitus.  The patient does not have symptoms concerning for COVID-19 infection (fever, chills, cough, or new shortness of breath).    Past Medical History:  Diagnosis Date  . Bladder dysfunction    Self urinary catheterization  . COPD (chronic obstructive pulmonary disease) ([place])   . DDD (degenerative disc disease)   . Depression   . Esophageal spasm    NTG and Norvasc  . Essential hypertension, benign   . Fibromyalgia   . Gastroparesis   . GERD (gastroesophageal reflux disease)   . History of cardiac catheterization    Normal coronary arteries 11/12  . History of pituitary tumor   . Irritable bowel syndrome   . Lymphedema   . Osteoarthritis   . PAF (paroxysmal atrial fibrillation) ([place])   . Sleep apnea    BIPAP  . Type 2 diabetes mellitus ([place])   . Urinary tract infection    Past Surgical History:  Procedure Laterality Date  . ABDOMINAL HYSTERECTOMY    . ANTERIOR CERVICAL DECOMP/DISCECTOMY FUSION N/A 12/02/2014   Procedure: Cervical five cervical six anterior cervical decompression with fusion interbody prosthesis plating and bone graft;  Surgeon: Newman Pies, MD;  Location: [place] [facility];  Service: Neurosurgery;  Laterality: N/A;  C56 anterior cervical decompression with fusion interbody prosthesis plating and bonegraft  . APPENDECTOMY    . BACK SURGERY    .  CARDIAC CATHETERIZATION    . CARDIOVERSION N/A 03/26/2014   Procedure: CARDIOVERSION;  Surgeon: Herminio Commons, MD;  Location: [facility];  Service: Endoscopy;  Laterality: N/A;  . CARDIOVERSION N/A 05/04/2014   Procedure: CARDIOVERSION;  Surgeon: Satira Sark, MD;  Location: [facility];  Service: Cardiovascular;  Laterality: N/A;  . CHOLECYSTECTOMY    . COLONOSCOPY     Approximately 2003. Per medical records, internal  hemorrhoids noted  . COLONOSCOPY N/A 06/25/2013   Dr. Gala Romney: Anal canal hemorrhoids-more likely the source of paper hematochezia. Redundant, capacious colon. Multiple colonic polyps-tubular adenoma  . COLONOSCOPY WITH PROPOFOL N/A 12/04/2016   Dr. Gala Romney: redundant colon, colonic diverticulosis. Screening due in 2023  . ENDOVENOUS ABLATION SAPHENOUS VEIN W/ LASER Left 03/07/2018   endovenous ablation left greater saphenous vein by Deitra Mayo MD   . ESOPHAGOGASTRODUODENOSCOPY  10/12/2004   MVE:HMCN plaquing on the esophageal mucosa of uncertain significance, not typical of what is seen with candida esophagitis status post KOH brushing for KOH prep and biopsy for histology. Rule out candida esophagitis/eosinophilic esophagitis. Otherwise normal esophagus. Tiny hiatal hernia. Otherwise, normal stomach, normal D1 and D2. Benign biopsy of esophagus, unknown KOH status.   . ESOPHAGOGASTRODUODENOSCOPY N/A 06/25/2013   Dr. Rourk:mild chronic gastritis  . KNEE SURGERY    . LEFT HEART CATHETERIZATION WITH CORONARY ANGIOGRAM N/A 11/15/2010   Procedure: LEFT HEART CATHETERIZATION WITH CORONARY ANGIOGRAM;  Surgeon: Laverda Page, MD;  Location: [facility] CATH LAB;  Service: Cardiovascular;  Laterality: N/A;  . LUNG BIOPSY       Current Meds  Medication Sig  . albuterol (PROAIR HFA) 108 (90 BASE) MCG/ACT inhaler Inhale 2 puffs into the lungs every 6 (six) hours as needed for wheezing or shortness of breath.   Marland Kitchen albuterol (PROVENTIL) (2.5 MG/3ML) 0.083% nebulizer solution Take 2.5 mg by nebulization every 6 (six) hours as needed for wheezing. For shortness of breath.  Marland Kitchen atorvastatin (LIPITOR) 10 MG tablet Take 10 mg by mouth at bedtime.  . baclofen (LIORESAL) 20 MG tablet Take 20 mg by mouth 3 (three) times daily.    Marland Kitchen buPROPion (WELLBUTRIN) 75 MG tablet Take 1 tablet (75 mg total) by mouth daily.  . CONSTULOSE 10 GM/15ML solution TAKE 15MLS BY MOUTH DAILY  . diclofenac sodium (VOLTAREN) 1 % GEL Apply  2-4 g topically 4 (four) times daily as needed (for pain).   Marland Kitchen diltiazem (CARDIZEM CD) 120 MG 24 hr capsule TAKE ONE CAPSULE BY MOUTH EVERY DAY  . docusate sodium (COLACE) 100 MG capsule Take 100 mg by mouth 2 (two) times daily.  . famotidine (PEPCID) 20 MG tablet Take 1 tablet (20 mg total) by mouth daily as needed for heartburn or indigestion.  . flecainide (TAMBOCOR) 50 MG tablet TAKE TWO TABLETS BY MOUTH EVERY MORNING and TAKE TWO TABLETS BY MOUTH EVERY EVENING  . fluticasone (FLONASE) 50 MCG/ACT nasal spray Place 2 sprays into both nostrils daily.  . furosemide (LASIX) 80 MG tablet Take 80 mg by mouth 2 (two) times daily.    Marland Kitchen gabapentin (NEURONTIN) 800 MG tablet Take 1 tablet by mouth 4 (four) times daily.  Marland Kitchen glimepiride (AMARYL) 2 MG tablet Take 4 mg by mouth 2 (two) times daily.   . hydrocortisone (ANUSOL-HC) 2.5 % rectal cream Place 1 application rectally 2 (two) times daily. (Patient taking differently: Place 1 application rectally as needed. )  . Insulin Degludec (TRESIBA [place]) Inject 46 Units into the skin at bedtime.   . isosorbide mononitrate (IMDUR) 120 MG  24 hr tablet Take 120 mg by mouth every morning.   . isosorbide mononitrate (IMDUR) 30 MG 24 hr tablet Take 30 mg by mouth every evening.   Marland Kitchen LINZESS 290 MCG CAPS capsule TAKE ONE CAPSULE BY MOUTH EVERY DAY BEFORE BREAKFAST - REPLACES MOVANTIK  . lisinopril (PRINIVIL,ZESTRIL) 10 MG tablet Take 10 mg by mouth every evening.   . loratadine (CLARITIN) 10 MG tablet Take 10 mg by mouth daily.    . meclizine (ANTIVERT) 25 MG tablet Take 25 mg by mouth 3 (three) times daily as needed for dizziness.   . metFORMIN (GLUCOPHAGE) 500 MG tablet Take 1,000 mg by mouth 2 (two) times daily.   . metolazone (ZAROXOLYN) 2.5 MG tablet Take 2.5 mg by mouth daily.  . Multiple Vitamin (MULTIVITAMIN WITH MINERALS) TABS tablet Take 1 tablet by mouth daily.  Marland Kitchen NEXIUM 40 MG capsule TAKE ONE CAPSULE BY MOUTH EVERY DAY BEFORE BREAKFAST (seven IN THE MORNING  PER pt)  . nitroGLYCERIN (NITROLINGUAL) 0.4 MG/SPRAY spray USE 1 SPRAY UNDER THE TONGUE AS NEEDED FOR CHEST PAIN - PA SENT 11/28/2016- JB  . NUCYNTA ER 200 MG TB12 Take 200 mg by mouth every 12 (twelve) hours.   Marland Kitchen nystatin (MYCOSTATIN/NYSTOP) 100000 UNIT/GM POWD Apply 1 g topically daily.   Marland Kitchen omega-3 acid ethyl esters (LOVAZA) 1 g capsule Take 1 g by mouth 4 (four) times daily.   . ondansetron (ZOFRAN) 4 MG tablet TAKE 4 MG BY MOUTH EVERY 8 HOURS AS NEEDED FOR NAUSEA OR VOMITING  . oxyCODONE-acetaminophen (PERCOCET) 10-325 MG per tablet Take 1 tablet by mouth every 6 (six) hours as needed for pain.   . polyethylene glycol powder (GLYCOLAX/MIRALAX) powder Take 34 g by mouth daily.   . potassium chloride SA (K-DUR,KLOR-CON) 20 MEQ tablet Take 40 mEq by mouth 5 (five) times daily.   . predniSONE (DELTASONE) 50 MG tablet 1 tablet PO daily  . Prucalopride Succinate (MOTEGRITY) 2 MG TABS Take 2 mg by mouth daily.  . Rivaroxaban (XARELTO) 20 MG TABS Take 20 mg by mouth every evening.   . Semaglutide,0.25 or 0.5MG /DOS, (OZEMPIC, 0.25 OR 0.5 MG/DOSE,) 2 MG/1.5ML SOPN Inject 0.25 mg into the skin once a week.  . spironolactone (ALDACTONE) 50 MG tablet Take 50 mg by mouth 3 (three) times daily.    . [DISCONTINUED] doxycycline (VIBRAMYCIN) 100 MG capsule Take 1 capsule (100 mg total) by mouth 2 (two) times daily.     Allergies:   Levofloxacin, Tape, Hyoscyamine sulfate, and Metoclopramide   Social History   Tobacco Use  . Smoking status: Former Smoker    Packs/day: 1.50    Years: 30.00    Pack years: 45.00    Types: Cigarettes    Start date: 03/06/1977    Quit date: 01/03/2007    Years since quitting: 11.6  . Smokeless tobacco: Never Used  Substance Use Topics  . Alcohol use: No    Alcohol/week: 0.0 standard drinks  . Drug use: No     Family Hx: The patient's family history includes Arrhythmia in her brother, father, and mother; Cancer in her mother and sister; Colon cancer in her maternal  grandfather, paternal aunt, and paternal aunt; Coronary artery disease in her father; Dementia in her mother; Depression in her sister, sister, and sister; Diabetes in her father and son; Heart attack in her father; Parkinson's disease in her father and mother; Stroke in her mother.  ROS:   Please see the history of present illness. All other systems reviewed and  are negative.   Prior CV studies:   The following studies were reviewed today:  Echocardiogram 06/17/2018 [facility]): Overall limited study based on report.  LVEF described as normal range with calculation provided at 54.8%.  Valves not well visualized.  Chest x-ray 08/16/2018: FINDINGS: The cardiomediastinal contours are normal. Chronic hyperinflation and bronchial thickening, unchanged. Mild biapical pleuroparenchymal scarring, stable. Pulmonary vasculature is normal. No consolidation, pleural effusion, or pneumothorax. No acute osseous abnormalities are seen.  IMPRESSION: Chronic hyperinflation and bronchial thickening, unchanged from prior exam. No new abnormality.  Labs/Other Tests and Data Reviewed:    EKG:  An ECG dated 08/16/2018 was personally reviewed today and demonstrated:  Sinus rhythm with prolonged PR interval and IVCD.  Recent Labs: 08/16/2018: B Natriuretic Peptide 25.0; BUN 21; Creatinine, Ser 0.65; Hemoglobin 11.7; Platelets 380; Potassium 3.5; Sodium 137    Wt Readings from Last 3 Encounters:  08/22/18 (!) 321 lb (145.6 kg)  08/16/18 (!) 319 lb 4.8 oz (144.8 kg)  06/25/18 (!) 328 lb 3.2 oz (148.9 kg)     Objective:    Vital Signs:  BP (!) 158/65   Pulse 73   Ht 5\' 9"  (1.753 m)   Wt (!) 321 lb (145.6 kg)   BMI 47.40 kg/m    General: Patient appears comfortable at rest. HEENT: Conjunctiva and lids normal. Skin: Normal appearance of color. Lungs: Patient spoke in full sentences, not short of breath.  No coughing or wheezing audible. Musculoskeletal: No kyphosis.  Neuropsychiatric: Gaze conjugate.  Speech pattern normal.  Patient moves all extremities.  ASSESSMENT & PLAN:    1.  Progressive dyspnea on exertion, reporting NYHA class III symptoms in a morbidly obese 58 year old woman with hypertension, type 2 diabetes mellitus, hyperlipidemia and well-controlled paroxysmal atrial fibrillation.  She had a heart catheterization in 2012 that showed normal coronary arteries but no recent reassessment.  Recent ER visit reviewed.  We have discussed diagnostic options and plan is to proceed with a right and left heart catheterization for further evaluation.  She will need to come off Xarelto per protocol for the procedure.  Risks and benefits discussed and she is in agreement to proceed.  2.  Paroxysmal atrial fibrillation, generally well controlled on Cardizem CD, flecainide, and Xarelto.  She does not report any recent palpitations and was noted to be in sinus rhythm by recent ECG.  3.  Morbid obesity and OSA.  Uses BiPAP at nighttime.  Also has COPD and follows with Dr. Marina Gravel in [place].  Shortness of breath may well be multifactorial.  4.  Type 2 diabetes mellitus, followed by Dr. Woody Seller.  5.  Essential hypertension, no changes made to present regimen at this time.  COVID-19 Education: The signs and symptoms of COVID-19 were discussed with the patient and how to seek care for testing (follow up with PCP or arrange E-visit).  The importance of social distancing was discussed today.  Time:   Today, I have spent 8 minutes with the patient with telehealth technology discussing the above problems.     Medication Adjustments/Labs and Tests Ordered: Current medicines are reviewed at length with the patient today.  Concerns regarding medicines are outlined above.   Tests Ordered: No orders of the defined types were placed in this encounter.   Medication Changes: No orders of the defined types were placed in this encounter.   Follow Up:  Virtual Visit or In  Person 1 month after procedure.  Signed, Rozann Lesches, MD  08/22/2018 10:24 AM  [facility]

## 2018-08-21 NOTE — Progress Notes (Signed)
Virtual Visit via Video Note   This visit type was conducted due to national recommendations for restrictions regarding the COVID-19 Pandemic (e.g. social distancing) in an effort to limit this patient's exposure and mitigate transmission in our community.  Due to her co-morbid illnesses, this patient is at least at moderate risk for complications without adequate follow up.  This format is felt to be most appropriate for this patient at this time.  All issues noted in this document were discussed and addressed.  A limited physical exam was performed with this format.  Please refer to the patient's chart for her consent to telehealth for [facility]   Date:  08/22/2018   ID:  TAMU GOLZ, DOB June 09, 1960, MRN [id number]  Patient Location: Home Provider Location: Home  PCP:  Glenda Chroman, MD  Cardiologist:  Rozann Lesches, MD Electrophysiologist:  None   Evaluation Performed:  Follow-Up Visit  Chief Complaint:   Cardiac follow-up  History of Present Illness:    [name] [name] is a 58 y.o. female last seen in June by Ms. Barrett PA-C.  I reviewed her last note and interval records.  She was actually just recently seen in the ER reporting shortness of breath.  ECG showed no acute ST segment changes.  High-sensitivity troponin I levels were negative and d-dimer was negative.  SARS coronavirus 2 test was also negative.  She was treated with steroids and bronchodilators for possible pulmonary process.  X-ray showed hyperinflation and bronchial thickening.  We discussed her symptoms.  She tells me that the recent treatment in the ER was not effective.  Describes progressive dyspnea on exertion over the last several months.  She gets short of breath getting dressed, making her bed, other basic ADLs.  Feeling of chest tightness at the time.  She also has COPD and follows with Dr. Marina Gravel in [place].  She does not necessarily feel wheezing with her shortness of breath.  Last ischemic  evaluation was via cardiac catheterization in 2012 at which time she had normal coronary arteries.  She does have active cardiac risk factors including obesity, hypertension, and type 2 diabetes mellitus.  The patient does not have symptoms concerning for COVID-19 infection (fever, chills, cough, or new shortness of breath).    Past Medical History:  Diagnosis Date  . Bladder dysfunction    Self urinary catheterization  . COPD (chronic obstructive pulmonary disease) ([place])   . DDD (degenerative disc disease)   . Depression   . Esophageal spasm    NTG and Norvasc  . Essential hypertension, benign   . Fibromyalgia   . Gastroparesis   . GERD (gastroesophageal reflux disease)   . History of cardiac catheterization    Normal coronary arteries 11/12  . History of pituitary tumor   . Irritable bowel syndrome   . Lymphedema   . Osteoarthritis   . PAF (paroxysmal atrial fibrillation) ([place])   . Sleep apnea    BIPAP  . Type 2 diabetes mellitus ([place])   . Urinary tract infection    Past Surgical History:  Procedure Laterality Date  . ABDOMINAL HYSTERECTOMY    . ANTERIOR CERVICAL DECOMP/DISCECTOMY FUSION N/A 12/02/2014   Procedure: Cervical five cervical six anterior cervical decompression with fusion interbody prosthesis plating and bone graft;  Surgeon: Newman Pies, MD;  Location: [facility];  Service: Neurosurgery;  Laterality: N/A;  C56 anterior cervical decompression with fusion interbody prosthesis plating and bonegraft  . APPENDECTOMY    . BACK SURGERY    .  CARDIAC CATHETERIZATION    . CARDIOVERSION N/A 03/26/2014   Procedure: CARDIOVERSION;  Surgeon: Herminio Commons, MD;  Location: [facility];  Service: Endoscopy;  Laterality: N/A;  . CARDIOVERSION N/A 05/04/2014   Procedure: CARDIOVERSION;  Surgeon: Satira Sark, MD;  Location: [facility];  Service: Cardiovascular;  Laterality: N/A;  . CHOLECYSTECTOMY    . COLONOSCOPY     Approximately 2003. Per medical records, internal  hemorrhoids noted  . COLONOSCOPY N/A 06/25/2013   Dr. Gala Romney: Anal canal hemorrhoids-more likely the source of paper hematochezia. Redundant, capacious colon. Multiple colonic polyps-tubular adenoma  . COLONOSCOPY WITH PROPOFOL N/A 12/04/2016   Dr. Gala Romney: redundant colon, colonic diverticulosis. Screening due in 2023  . ENDOVENOUS ABLATION SAPHENOUS VEIN W/ LASER Left 03/07/2018   endovenous ablation left greater saphenous vein by Deitra Mayo MD   . ESOPHAGOGASTRODUODENOSCOPY  10/12/2004   BMW:UXLK plaquing on the esophageal mucosa of uncertain significance, not typical of what is seen with candida esophagitis status post KOH brushing for KOH prep and biopsy for histology. Rule out candida esophagitis/eosinophilic esophagitis. Otherwise normal esophagus. Tiny hiatal hernia. Otherwise, normal stomach, normal D1 and D2. Benign biopsy of esophagus, unknown KOH status.   . ESOPHAGOGASTRODUODENOSCOPY N/A 06/25/2013   Dr. Rourk:mild chronic gastritis  . KNEE SURGERY    . LEFT HEART CATHETERIZATION WITH CORONARY ANGIOGRAM N/A 11/15/2010   Procedure: LEFT HEART CATHETERIZATION WITH CORONARY ANGIOGRAM;  Surgeon: Laverda Page, MD;  Location[facility] CATH LAB;  Service: Cardiovascular;  Laterality: N/A;  . LUNG BIOPSY       Current Meds  Medication Sig  . albuterol (PROAIR HFA) 108 (90 BASE) MCG/ACT inhaler Inhale 2 puffs into the lungs every 6 (six) hours as needed for wheezing or shortness of breath.   Marland Kitchen albuterol (PROVENTIL) (2.5 MG/3ML) 0.083% nebulizer solution Take 2.5 mg by nebulization every 6 (six) hours as needed for wheezing. For shortness of breath.  Marland Kitchen atorvastatin (LIPITOR) 10 MG tablet Take 10 mg by mouth at bedtime.  . baclofen (LIORESAL) 20 MG tablet Take 20 mg by mouth 3 (three) times daily.    Marland Kitchen buPROPion (WELLBUTRIN) 75 MG tablet Take 1 tablet (75 mg total) by mouth daily.  . CONSTULOSE 10 GM/15ML solution TAKE 15MLS BY MOUTH DAILY  . diclofenac sodium (VOLTAREN) 1 % GEL Apply  2-4 g topically 4 (four) times daily as needed (for pain).   Marland Kitchen diltiazem (CARDIZEM CD) 120 MG 24 hr capsule TAKE ONE CAPSULE BY MOUTH EVERY DAY  . docusate sodium (COLACE) 100 MG capsule Take 100 mg by mouth 2 (two) times daily.  . famotidine (PEPCID) 20 MG tablet Take 1 tablet (20 mg total) by mouth daily as needed for heartburn or indigestion.  . flecainide (TAMBOCOR) 50 MG tablet TAKE TWO TABLETS BY MOUTH EVERY MORNING and TAKE TWO TABLETS BY MOUTH EVERY EVENING  . fluticasone (FLONASE) 50 MCG/ACT nasal spray Place 2 sprays into both nostrils daily.  . furosemide (LASIX) 80 MG tablet Take 80 mg by mouth 2 (two) times daily.    Marland Kitchen gabapentin (NEURONTIN) 800 MG tablet Take 1 tablet by mouth 4 (four) times daily.  Marland Kitchen glimepiride (AMARYL) 2 MG tablet Take 4 mg by mouth 2 (two) times daily.   . hydrocortisone (ANUSOL-HC) 2.5 % rectal cream Place 1 application rectally 2 (two) times daily. (Patient taking differently: Place 1 application rectally as needed. )  . Insulin Degludec (TRESIBA [place]) Inject 46 Units into the skin at bedtime.   . isosorbide mononitrate (IMDUR) 120 MG  24 hr tablet Take 120 mg by mouth every morning.   . isosorbide mononitrate (IMDUR) 30 MG 24 hr tablet Take 30 mg by mouth every evening.   Marland Kitchen LINZESS 290 MCG CAPS capsule TAKE ONE CAPSULE BY MOUTH EVERY DAY BEFORE BREAKFAST - REPLACES MOVANTIK  . lisinopril (PRINIVIL,ZESTRIL) 10 MG tablet Take 10 mg by mouth every evening.   . loratadine (CLARITIN) 10 MG tablet Take 10 mg by mouth daily.    . meclizine (ANTIVERT) 25 MG tablet Take 25 mg by mouth 3 (three) times daily as needed for dizziness.   . metFORMIN (GLUCOPHAGE) 500 MG tablet Take 1,000 mg by mouth 2 (two) times daily.   . metolazone (ZAROXOLYN) 2.5 MG tablet Take 2.5 mg by mouth daily.  . Multiple Vitamin (MULTIVITAMIN WITH MINERALS) TABS tablet Take 1 tablet by mouth daily.  Marland Kitchen NEXIUM 40 MG capsule TAKE ONE CAPSULE BY MOUTH EVERY DAY BEFORE BREAKFAST (seven IN THE MORNING  PER pt)  . nitroGLYCERIN (NITROLINGUAL) 0.4 MG/SPRAY spray USE 1 SPRAY UNDER THE TONGUE AS NEEDED FOR CHEST PAIN - PA SENT 11/28/2016- JB  . NUCYNTA ER 200 MG TB12 Take 200 mg by mouth every 12 (twelve) hours.   Marland Kitchen nystatin (MYCOSTATIN/NYSTOP) 100000 UNIT/GM POWD Apply 1 g topically daily.   Marland Kitchen omega-3 acid ethyl esters (LOVAZA) 1 g capsule Take 1 g by mouth 4 (four) times daily.   . ondansetron (ZOFRAN) 4 MG tablet TAKE 4 MG BY MOUTH EVERY 8 HOURS AS NEEDED FOR NAUSEA OR VOMITING  . oxyCODONE-acetaminophen (PERCOCET) 10-325 MG per tablet Take 1 tablet by mouth every 6 (six) hours as needed for pain.   . polyethylene glycol powder (GLYCOLAX/MIRALAX) powder Take 34 g by mouth daily.   . potassium chloride SA (K-DUR,KLOR-CON) 20 MEQ tablet Take 40 mEq by mouth 5 (five) times daily.   . predniSONE (DELTASONE) 50 MG tablet 1 tablet PO daily  . Prucalopride Succinate (MOTEGRITY) 2 MG TABS Take 2 mg by mouth daily.  . Rivaroxaban (XARELTO) 20 MG TABS Take 20 mg by mouth every evening.   . Semaglutide,0.25 or 0.5MG /DOS, (OZEMPIC, 0.25 OR 0.5 MG/DOSE,) 2 MG/1.5ML SOPN Inject 0.25 mg into the skin once a week.  . spironolactone (ALDACTONE) 50 MG tablet Take 50 mg by mouth 3 (three) times daily.    . [DISCONTINUED] doxycycline (VIBRAMYCIN) 100 MG capsule Take 1 capsule (100 mg total) by mouth 2 (two) times daily.     Allergies:   Levofloxacin, Tape, Hyoscyamine sulfate, and Metoclopramide   Social History   Tobacco Use  . Smoking status: Former Smoker    Packs/day: 1.50    Years: 30.00    Pack years: 45.00    Types: Cigarettes    Start date: 03/06/1977    Quit date: 01/03/2007    Years since quitting: 11.6  . Smokeless tobacco: Never Used  Substance Use Topics  . Alcohol use: No    Alcohol/week: 0.0 standard drinks  . Drug use: No     Family Hx: The patient's family history includes Arrhythmia in her brother, father, and mother; Cancer in her mother and sister; Colon cancer in her maternal  grandfather, paternal aunt, and paternal aunt; Coronary artery disease in her father; Dementia in her mother; Depression in her sister, sister, and sister; Diabetes in her father and son; Heart attack in her father; Parkinson's disease in her father and mother; Stroke in her mother.  ROS:   Please see the history of present illness. All other systems reviewed and  are negative.   Prior CV studies:   The following studies were reviewed today:  Echocardiogram 06/17/2018 [facility]): Overall limited study based on report.  LVEF described as normal range with calculation provided at 54.8%.  Valves not well visualized.  Chest x-ray 08/16/2018: FINDINGS: The cardiomediastinal contours are normal. Chronic hyperinflation and bronchial thickening, unchanged. Mild biapical pleuroparenchymal scarring, stable. Pulmonary vasculature is normal. No consolidation, pleural effusion, or pneumothorax. No acute osseous abnormalities are seen.  IMPRESSION: Chronic hyperinflation and bronchial thickening, unchanged from prior exam. No new abnormality.  Labs/Other Tests and Data Reviewed:    EKG:  An ECG dated 08/16/2018 was personally reviewed today and demonstrated:  Sinus rhythm with prolonged PR interval and IVCD.  Recent Labs: 08/16/2018: B Natriuretic Peptide 25.0; BUN 21; Creatinine, Ser 0.65; Hemoglobin 11.7; Platelets 380; Potassium 3.5; Sodium 137    Wt Readings from Last 3 Encounters:  08/22/18 (!) 321 lb (145.6 kg)  08/16/18 (!) 319 lb 4.8 oz (144.8 kg)  06/25/18 (!) 328 lb 3.2 oz (148.9 kg)     Objective:    Vital Signs:  BP (!) 158/65   Pulse 73   Ht 5\' 9"  (1.753 m)   Wt (!) 321 lb (145.6 kg)   BMI 47.40 kg/m    General: Patient appears comfortable at rest. HEENT: Conjunctiva and lids normal. Skin: Normal appearance of color. Lungs: Patient spoke in full sentences, not short of breath.  No coughing or wheezing audible. Musculoskeletal: No kyphosis.  Neuropsychiatric: Gaze conjugate.  Speech pattern normal.  Patient moves all extremities.  ASSESSMENT & PLAN:    1.  Progressive dyspnea on exertion, reporting NYHA class III symptoms in a morbidly obese 58 year old woman with hypertension, type 2 diabetes mellitus, hyperlipidemia and well-controlled paroxysmal atrial fibrillation.  She had a heart catheterization in 2012 that showed normal coronary arteries but no recent reassessment.  Recent ER visit reviewed.  We have discussed diagnostic options and plan is to proceed with a right and left heart catheterization for further evaluation.  She will need to come off Xarelto per protocol for the procedure.  Risks and benefits discussed and she is in agreement to proceed.  2.  Paroxysmal atrial fibrillation, generally well controlled on Cardizem CD, flecainide, and Xarelto.  She does not report any recent palpitations and was noted to be in sinus rhythm by recent ECG.  3.  Morbid obesity and OSA.  Uses BiPAP at nighttime.  Also has COPD and follows with Dr. Marina Gravel in [place].  Shortness of breath may well be multifactorial.  4.  Type 2 diabetes mellitus, followed by Dr. Woody Seller.  5.  Essential hypertension, no changes made to present regimen at this time.  COVID-19 Education: The signs and symptoms of COVID-19 were discussed with the patient and how to seek care for testing (follow up with PCP or arrange E-visit).  The importance of social distancing was discussed today.  Time:   Today, I have spent 8 minutes with the patient with telehealth technology discussing the above problems.     Medication Adjustments/Labs and Tests Ordered: Current medicines are reviewed at length with the patient today.  Concerns regarding medicines are outlined above.   Tests Ordered: No orders of the defined types were placed in this encounter.   Medication Changes: No orders of the defined types were placed in this encounter.   Follow Up:  Virtual Visit or In  Person 1 month after procedure.  Signed, Rozann Lesches, MD  08/22/2018 10:24 AM  [facility]

## 2018-08-22 ENCOUNTER — Other Ambulatory Visit: Payer: Self-pay | Admitting: Cardiology

## 2018-08-22 ENCOUNTER — Other Ambulatory Visit: Payer: Self-pay

## 2018-08-22 ENCOUNTER — Telehealth (INDEPENDENT_AMBULATORY_CARE_PROVIDER_SITE_OTHER): Payer: Medicare Other | Admitting: Cardiology

## 2018-08-22 ENCOUNTER — Encounter: Payer: Self-pay | Admitting: Cardiology

## 2018-08-22 VITALS — BP 158/65 | HR 73 | Ht 69.0 in | Wt 321.0 lb

## 2018-08-22 DIAGNOSIS — R0600 Dyspnea, unspecified: Secondary | ICD-10-CM

## 2018-08-22 DIAGNOSIS — G4733 Obstructive sleep apnea (adult) (pediatric): Secondary | ICD-10-CM | POA: Diagnosis not present

## 2018-08-22 DIAGNOSIS — R0609 Other forms of dyspnea: Secondary | ICD-10-CM | POA: Diagnosis not present

## 2018-08-22 DIAGNOSIS — J449 Chronic obstructive pulmonary disease, unspecified: Secondary | ICD-10-CM

## 2018-08-22 DIAGNOSIS — I480 Paroxysmal atrial fibrillation: Secondary | ICD-10-CM

## 2018-08-22 DIAGNOSIS — I10 Essential (primary) hypertension: Secondary | ICD-10-CM | POA: Diagnosis not present

## 2018-08-22 MED ORDER — SODIUM CHLORIDE 0.9% FLUSH: 3.0000 mL | Freq: Two times a day (BID) | INTRAVENOUS | Status: DC

## 2018-08-22 NOTE — Patient Instructions (Signed)
Medication Instructions:  Your physician recommends that you continue on your current medications as directed. Please refer to the Current Medication list given to you today.   Labwork: covid test - 08/23/2018 @ 10:15 am across from [facility]   Testing/Procedures: Your physician has requested that you have a cardiac catheterization. Cardiac catheterization is used to diagnose and/or treat various heart conditions. Doctors may recommend this procedure for a number of different reasons. The most common reason is to evaluate chest pain. Chest pain can be a symptom of coronary artery disease (CAD), and cardiac catheterization can show whether plaque is narrowing or blocking your heart's arteries. This procedure is also used to evaluate the valves, as well as measure the blood flow and oxygen levels in different parts of your heart. For further information please visit [email or web address]. Please follow instruction sheet, as given.    Follow-Up: Your physician recommends that you schedule a follow-up appointment in: 1 month   Any Other Special Instructions Will Be Listed Below (If Applicable).     If you need a refill on your cardiac medications before your next appointment, please call your pharmacy.

## 2018-08-23 ENCOUNTER — Other Ambulatory Visit: Payer: Self-pay

## 2018-08-23 ENCOUNTER — Other Ambulatory Visit (HOSPITAL_COMMUNITY)
Admission: RE | Admit: 2018-08-23 | Discharge: 2018-08-23 | Disposition: A | Discharge status: Home / Self Care | Payer: Medicare Other | Source: Ambulatory Visit | Attending: Cardiovascular Disease | Admitting: Cardiovascular Disease

## 2018-08-23 DIAGNOSIS — Z20828 Contact with and (suspected) exposure to other viral communicable diseases: Secondary | ICD-10-CM | POA: Insufficient documentation

## 2018-08-23 DIAGNOSIS — Z01812 Encounter for preprocedural laboratory examination: Secondary | ICD-10-CM | POA: Insufficient documentation

## 2018-08-23 LAB — SARS CORONAVIRUS 2 (TAT 6-24 HRS): SARS Coronavirus 2: NEGATIVE

## 2018-08-27 ENCOUNTER — Telehealth: Payer: Self-pay | Admitting: *Deleted

## 2018-08-27 NOTE — Telephone Encounter (Signed)
Pt contacted pre-catheterization scheduled at [facility] for:  Wednesday August 28, 2018 10 AM Verified arrival time and place: [facility]) at: 8 AM   No solid food after midnight prior to cath, clear liquids until 5 AM day of procedure. Contrast allergy: no  Hold: Xarelto-last dose 08/25/18 until post procedure. Metformin-day of procedure and 48 hours post procedure. Glimepiride-AM of procedure. 1/2 usual Tresiba-PM prior to procedure-pt states she does not take daily AM Insulin Metolazone-AM of procedure. Spironolactone-AM of procedure. Lasix-AM of procedure. KCl-AM of procedure.  Except hold medications AM meds can be  taken pre-cath with sip of water including: ASA 81 mg   Confirmed patient has responsible person to drive home post procedure and observe 24 hours after arriving home: yes  Currently, due to Covid-19 pandemic, only one support person will be allowed with patient. Must be the same support person for that patient's entire stay, will be screened and required to wear a mask. They will be asked to wait in the waiting room for the duration of the patient's stay.  Patients are required to wear a mask when they enter the hospital.       COVID-19 Pre-Screening Questions:  . In the past 7 to 10 days have you had a cough,  shortness of breath, headache, congestion, fever (100 or greater) body aches, chills, sore throat, or sudden loss of taste or sense of smell? Shortness of breath . Have you been around anyone with known Covid 19? no . Have you been around anyone who is awaiting Covid 19 test results in the past 7 to 10 days? no . Have you been around anyone who has been exposed to Covid 19, or has mentioned symptoms of Covid 19 within the past 7 to 10 days? no    I reviewed procedure/mask/visitor, Covid-19 screening questions with patient, she verbalized understanding, thanked me for call.

## 2018-08-28 ENCOUNTER — Other Ambulatory Visit: Payer: Self-pay

## 2018-08-28 ENCOUNTER — Ambulatory Visit (HOSPITAL_COMMUNITY)
Admission: RE | Admit: 2018-08-28 | Discharge: 2018-08-28 | Disposition: A | Discharge status: Home / Self Care | Payer: Medicare Other | Attending: Cardiovascular Disease | Admitting: Cardiovascular Disease

## 2018-08-28 ENCOUNTER — Encounter (HOSPITAL_COMMUNITY)
Admission: RE | Disposition: A | Discharge status: Home / Self Care | Payer: Self-pay | Source: Home / Self Care | Attending: Cardiovascular Disease

## 2018-08-28 ENCOUNTER — Encounter (HOSPITAL_COMMUNITY): Payer: Self-pay | Admitting: Cardiovascular Disease

## 2018-08-28 DIAGNOSIS — Z881 Allergy status to other antibiotic agents status: Secondary | ICD-10-CM | POA: Diagnosis not present

## 2018-08-28 DIAGNOSIS — R0609 Other forms of dyspnea: Secondary | ICD-10-CM

## 2018-08-28 DIAGNOSIS — E1143 Type 2 diabetes mellitus with diabetic autonomic (poly)neuropathy: Secondary | ICD-10-CM | POA: Diagnosis not present

## 2018-08-28 DIAGNOSIS — M797 Fibromyalgia: Secondary | ICD-10-CM | POA: Insufficient documentation

## 2018-08-28 DIAGNOSIS — K589 Irritable bowel syndrome without diarrhea: Secondary | ICD-10-CM | POA: Insufficient documentation

## 2018-08-28 DIAGNOSIS — I480 Paroxysmal atrial fibrillation: Secondary | ICD-10-CM | POA: Diagnosis not present

## 2018-08-28 DIAGNOSIS — Z79899 Other long term (current) drug therapy: Secondary | ICD-10-CM | POA: Insufficient documentation

## 2018-08-28 DIAGNOSIS — K219 Gastro-esophageal reflux disease without esophagitis: Secondary | ICD-10-CM | POA: Insufficient documentation

## 2018-08-28 DIAGNOSIS — Z87891 Personal history of nicotine dependence: Secondary | ICD-10-CM | POA: Insufficient documentation

## 2018-08-28 DIAGNOSIS — Z794 Long term (current) use of insulin: Secondary | ICD-10-CM | POA: Diagnosis not present

## 2018-08-28 DIAGNOSIS — Z888 Allergy status to other drugs, medicaments and biological substances status: Secondary | ICD-10-CM | POA: Insufficient documentation

## 2018-08-28 DIAGNOSIS — Z6841 Body Mass Index (BMI) 40.0 and over, adult: Secondary | ICD-10-CM | POA: Diagnosis not present

## 2018-08-28 DIAGNOSIS — F329 Major depressive disorder, single episode, unspecified: Secondary | ICD-10-CM | POA: Insufficient documentation

## 2018-08-28 DIAGNOSIS — I10 Essential (primary) hypertension: Secondary | ICD-10-CM | POA: Insufficient documentation

## 2018-08-28 DIAGNOSIS — K3184 Gastroparesis: Secondary | ICD-10-CM | POA: Diagnosis not present

## 2018-08-28 DIAGNOSIS — G4733 Obstructive sleep apnea (adult) (pediatric): Secondary | ICD-10-CM | POA: Insufficient documentation

## 2018-08-28 DIAGNOSIS — Z7901 Long term (current) use of anticoagulants: Secondary | ICD-10-CM | POA: Insufficient documentation

## 2018-08-28 DIAGNOSIS — R0600 Dyspnea, unspecified: Secondary | ICD-10-CM

## 2018-08-28 DIAGNOSIS — E785 Hyperlipidemia, unspecified: Secondary | ICD-10-CM | POA: Diagnosis not present

## 2018-08-28 DIAGNOSIS — Z791 Long term (current) use of non-steroidal anti-inflammatories (NSAID): Secondary | ICD-10-CM | POA: Insufficient documentation

## 2018-08-28 DIAGNOSIS — J449 Chronic obstructive pulmonary disease, unspecified: Secondary | ICD-10-CM | POA: Diagnosis not present

## 2018-08-28 DIAGNOSIS — M1990 Unspecified osteoarthritis, unspecified site: Secondary | ICD-10-CM | POA: Diagnosis not present

## 2018-08-28 HISTORY — PX: RIGHT/LEFT HEART CATH AND CORONARY ANGIOGRAPHY: CATH118266

## 2018-08-28 LAB — POCT I-STAT 7, (LYTES, BLD GAS, ICA,H+H)
Bicarbonate: 24.7 mmol/L (ref 20.0–28.0)
Calcium, Ion: 1.31 mmol/L (ref 1.15–1.40)
HCT: 33 % — ABNORMAL LOW (ref 36.0–46.0)
Hemoglobin: 11.2 g/dL — ABNORMAL LOW (ref 12.0–15.0)
O2 Saturation: 99 %
Potassium: 3.9 mmol/L (ref 3.5–5.1)
Sodium: 137 mmol/L (ref 135–145)
TCO2: 26 mmol/L (ref 22–32)
pCO2 arterial: 40.9 mmHg (ref 32.0–48.0)
pH, Arterial: 7.39 (ref 7.350–7.450)
pO2, Arterial: 125 mmHg — ABNORMAL HIGH (ref 83.0–108.0)

## 2018-08-28 LAB — POCT I-STAT EG7
Acid-base deficit: 2 mmol/L (ref 0.0–2.0)
Bicarbonate: 23.6 mmol/L (ref 20.0–28.0)
Calcium, Ion: 1.2 mmol/L (ref 1.15–1.40)
HCT: 31 % — ABNORMAL LOW (ref 36.0–46.0)
Hemoglobin: 10.5 g/dL — ABNORMAL LOW (ref 12.0–15.0)
O2 Saturation: 79 %
Potassium: 3.6 mmol/L (ref 3.5–5.1)
Sodium: 140 mmol/L (ref 135–145)
TCO2: 25 mmol/L (ref 22–32)
pCO2, Ven: 41.3 mmHg — ABNORMAL LOW (ref 44.0–60.0)
pH, Ven: 7.365 (ref 7.250–7.430)
pO2, Ven: 45 mmHg (ref 32.0–45.0)

## 2018-08-28 LAB — GLUCOSE, CAPILLARY
Glucose-Capillary: 164 mg/dL — ABNORMAL HIGH (ref 70–99)
Glucose-Capillary: 135 mg/dL — ABNORMAL HIGH (ref 70–99)

## 2018-08-28 SURGERY — RIGHT/LEFT HEART CATH AND CORONARY ANGIOGRAPHY
Anesthesia: LOCAL

## 2018-08-28 MED ORDER — OXYCODONE-ACETAMINOPHEN 5-325 MG PO TABS
2.0000 | ORAL_TABLET | Freq: Once | ORAL | Status: AC
  Administered 2018-08-28: 2 via ORAL
  Filled 2018-08-28: qty 2

## 2018-08-28 MED ORDER — ASPIRIN 81 MG PO CHEW: 81.0000 mg | CHEWABLE_TABLET | ORAL | Status: DC

## 2018-08-28 MED ORDER — LIDOCAINE HCL (PF) 1 % IJ SOLN
INTRAMUSCULAR | Status: DC | PRN
  Administered 2018-08-28: 2 mL
  Administered 2018-08-28: 3 mL

## 2018-08-28 MED ORDER — SODIUM CHLORIDE 0.9 % WEIGHT BASED INFUSION
3.0000 mL/kg/h | INTRAVENOUS | Status: AC
  Administered 2018-08-28: 3 mL/kg/h via INTRAVENOUS

## 2018-08-28 MED ORDER — FENTANYL CITRATE (PF) 100 MCG/2ML IJ SOLN
INTRAMUSCULAR | Status: AC
  Filled 2018-08-28: qty 2

## 2018-08-28 MED ORDER — MIDAZOLAM HCL 2 MG/2ML IJ SOLN
INTRAMUSCULAR | Status: DC | PRN
  Administered 2018-08-28: 1 mg via INTRAVENOUS

## 2018-08-28 MED ORDER — SODIUM CHLORIDE 0.9% FLUSH: 3.0000 mL | INTRAVENOUS | Status: DC | PRN

## 2018-08-28 MED ORDER — HEPARIN (PORCINE) IN NACL 1000-0.9 UT/500ML-% IV SOLN
INTRAVENOUS | Status: DC | PRN
  Administered 2018-08-28 (×2): 500 mL

## 2018-08-28 MED ORDER — ACETAMINOPHEN 325 MG PO TABS: 650.0000 mg | ORAL_TABLET | ORAL | Status: DC | PRN

## 2018-08-28 MED ORDER — IOHEXOL 350 MG/ML SOLN
INTRAVENOUS | Status: DC | PRN
  Administered 2018-08-28: 90 mL via INTRAVENOUS

## 2018-08-28 MED ORDER — SODIUM CHLORIDE 0.9 % WEIGHT BASED INFUSION: 1.0000 mL/kg/h | INTRAVENOUS | Status: DC

## 2018-08-28 MED ORDER — FENTANYL CITRATE (PF) 100 MCG/2ML IJ SOLN
INTRAMUSCULAR | Status: DC | PRN
  Administered 2018-08-28: 25 ug via INTRAVENOUS

## 2018-08-28 MED ORDER — ONDANSETRON HCL 4 MG/2ML IJ SOLN: 4.0000 mg | Freq: Four times a day (QID) | INTRAMUSCULAR | Status: DC | PRN

## 2018-08-28 MED ORDER — SODIUM CHLORIDE 0.9 % IV SOLN: 250.0000 mL | INTRAVENOUS | Status: DC | PRN

## 2018-08-28 MED ORDER — SODIUM CHLORIDE 0.9 % IV SOLN: INTRAVENOUS | Status: AC

## 2018-08-28 MED ORDER — VERAPAMIL HCL 2.5 MG/ML IV SOLN
INTRAVENOUS | Status: DC | PRN
  Administered 2018-08-28: 10 mL via INTRA_ARTERIAL

## 2018-08-28 MED ORDER — LABETALOL HCL 5 MG/ML IV SOLN: 10.0000 mg | INTRAVENOUS | Status: DC | PRN

## 2018-08-28 MED ORDER — LIDOCAINE HCL (PF) 1 % IJ SOLN
INTRAMUSCULAR | Status: AC
  Filled 2018-08-28: qty 30

## 2018-08-28 MED ORDER — SODIUM CHLORIDE 0.9% FLUSH: 3.0000 mL | Freq: Two times a day (BID) | INTRAVENOUS | Status: DC

## 2018-08-28 MED ORDER — HEPARIN (PORCINE) IN NACL 1000-0.9 UT/500ML-% IV SOLN
INTRAVENOUS | Status: AC
  Filled 2018-08-28: qty 1000

## 2018-08-28 MED ORDER — HEPARIN SODIUM (PORCINE) 1000 UNIT/ML IJ SOLN
INTRAMUSCULAR | Status: DC | PRN
  Administered 2018-08-28: 5000 [IU] via INTRAVENOUS

## 2018-08-28 MED ORDER — VERAPAMIL HCL 2.5 MG/ML IV SOLN
INTRAVENOUS | Status: AC
  Filled 2018-08-28: qty 2

## 2018-08-28 MED ORDER — HYDRALAZINE HCL 20 MG/ML IJ SOLN: 10.0000 mg | INTRAMUSCULAR | Status: DC | PRN

## 2018-08-28 MED ORDER — MIDAZOLAM HCL 2 MG/2ML IJ SOLN
INTRAMUSCULAR | Status: AC
  Filled 2018-08-28: qty 2

## 2018-08-28 SURGICAL SUPPLY — 13 items
CATH 5FR JL3.5 JR4 ANG PIG MP (CATHETERS) ×2 IMPLANT
CATH BALLN WEDGE 5F 110CM (CATHETERS) ×2 IMPLANT
DEVICE RAD COMP TR BAND LRG (VASCULAR PRODUCTS) ×2 IMPLANT
GLIDESHEATH SLEND SS 6F .021 (SHEATH) ×2 IMPLANT
GUIDEWIRE .025 260CM (WIRE) ×2 IMPLANT
GUIDEWIRE INQWIRE 1.5J.035X260 (WIRE) ×1 IMPLANT
INQWIRE 1.5J .035X260CM (WIRE) ×2
KIT HEART LEFT (KITS) ×2 IMPLANT
PACK CARDIAC CATHETERIZATION (CUSTOM PROCEDURE TRAY) ×2 IMPLANT
SHEATH GLIDE SLENDER 4/5FR (SHEATH) ×2 IMPLANT
SYR MEDRAD MARK 7 150ML (SYRINGE) ×2 IMPLANT
TRANSDUCER W/STOPCOCK (MISCELLANEOUS) ×2 IMPLANT
TUBING CIL FLEX 10 FLL-RA (TUBING) ×2 IMPLANT

## 2018-08-28 NOTE — Discharge Instructions (Signed)
NO METFORMIN/GLUCOPHAGE FOR 2 DAYS   Moderate Conscious Sedation, Adult, Care After These instructions provide you with information about caring for yourself after your procedure. Your health care provider may also give you more specific instructions. Your treatment has been planned according to current medical practices, but problems sometimes occur. Call your health care provider if you have any problems or questions after your procedure. What can I expect after the procedure? After your procedure, it is common:  To feel sleepy for several hours.  To feel clumsy and have poor balance for several hours.  To have poor judgment for several hours.  To vomit if you eat too soon. Follow these instructions at home: For at least 24 hours after the procedure:   Do not: ? Participate in activities where you could fall or become injured. ? Drive. ? Use heavy machinery. ? Drink alcohol. ? Take sleeping pills or medicines that cause drowsiness. ? Make important decisions or sign legal documents. ? Take care of children on your own.  Rest. Eating and drinking  Follow the diet recommended by your health care provider.  If you vomit: ? Drink water, juice, or soup when you can drink without vomiting. ? Make sure you have little or no nausea before eating solid foods. General instructions  Have a responsible adult stay with you until you are awake and alert.  Take over-the-counter and prescription medicines only as told by your health care provider.  If you smoke, do not smoke without supervision.  Keep all follow-up visits as told by your health care provider. This is important. Contact a health care provider if:  You keep feeling nauseous or you keep vomiting.  You feel light-headed.  You develop a rash.  You have a fever. Get help right away if:  You have trouble breathing. This information is not intended to replace advice given to you by your health care provider. Make  sure you discuss any questions you have with your health care provider. Document Released: 10/09/2012 Document Revised: 12/01/2016 Document Reviewed: 04/10/2015 Elsevier Patient Education  2020 [place]  This sheet gives you information about how to care for yourself after your procedure. Your health care provider may also give you more specific instructions. If you have problems or questions, contact your health care provider. What can I expect after the procedure? After the procedure, it is common to have:  Bruising and tenderness at the catheter insertion area. Follow these instructions at home: Medicines  Take over-the-counter and prescription medicines only as told by your health care provider. Insertion site care  Follow instructions from your health care provider about how to take care of your insertion site. Make sure you: ? Wash your hands with soap and water before you change your bandage (dressing). If soap and water are not available, use hand sanitizer. ? Change your dressing as told by your health care provider. ? Leave stitches (sutures), skin glue, or adhesive strips in place. These skin closures may need to stay in place for 2 weeks or longer. If adhesive strip edges start to loosen and curl up, you may trim the loose edges. Do not remove adhesive strips completely unless your health care provider tells you to do that.  Check your insertion site every day for signs of infection. Check for: ? Redness, swelling, or pain. ? Fluid or blood. ? Pus or a bad smell. ? Warmth.  Do not take baths, swim, or use a hot  tub until your health care provider approves.  You may shower 24-48 hours after the procedure, or as directed by your health care provider. ? Remove the dressing and gently wash the site with plain soap and water. ? Pat the area dry with a clean towel. ? Do not rub the site. That could cause bleeding.  Do not apply powder or lotion to  the site. Activity   For 24 hours after the procedure, or as directed by your health care provider: ? Do not flex or bend the affected arm. ? Do not push or pull heavy objects with the affected arm. ? Do not drive yourself home from the hospital or clinic. You may drive 24 hours after the procedure unless your health care provider tells you not to. ? Do not operate machinery or power tools.  Do not lift anything that is heavier than 10 lb (4.5 kg), or the limit that you are told, until your health care provider says that it is safe.  Ask your health care provider when it is okay to: ? Return to work or school. ? Resume usual physical activities or sports. ? Resume sexual activity. General instructions  If the catheter site starts to bleed, raise your arm and put firm pressure on the site. If the bleeding does not stop, get help right away. This is a medical emergency.  If you went home on the same day as your procedure, a responsible adult should be with you for the first 24 hours after you arrive home.  Keep all follow-up visits as told by your health care provider. This is important. Contact a health care provider if:  You have a fever.  You have redness, swelling, or yellow drainage around your insertion site. Get help right away if:  You have unusual pain at the radial site.  The catheter insertion area swells very fast.  The insertion area is bleeding, and the bleeding does not stop when you hold steady pressure on the area.  Your arm or hand becomes pale, cool, tingly, or numb. These symptoms may represent a serious problem that is an emergency. Do not wait to see if the symptoms will go away. Get medical help right away. Call your local emergency services (911 in the [place] Do not drive yourself to the hospital. Summary  After the procedure, it is common to have bruising and tenderness at the site.  Follow instructions from your health care provider about how to take  care of your radial site wound. Check the wound every day for signs of infection.  Do not lift anything that is heavier than 10 lb (4.5 kg), or the limit that you are told, until your health care provider says that it is safe. This information is not intended to replace advice given to you by your health care provider. Make sure you discuss any questions you have with your health care provider. Document Released: 01/21/2010 Document Revised: 01/24/2017 Document Reviewed: 01/24/2017 Elsevier Patient Education  2020 [place] on 08/29/18.

## 2018-08-28 NOTE — Interval H&P Note (Signed)
History and Physical Interval Note:  08/28/2018 8:31 AM  [name]  has presented today for surgery, with the diagnosis of dyspnea on exertion.  The various methods of treatment have been discussed with the patient and family. After consideration of risks, benefits and other options for treatment, the patient has consented to  Procedure(s): RIGHT/LEFT HEART CATH AND CORONARY ANGIOGRAPHY (N/A) as a surgical intervention.  The patient's history has been reviewed, patient examined, no change in status, stable for surgery.  I have reviewed the patient's chart and labs.  Questions were answered to the patient's satisfaction.    Cath Lab Visit (complete for each Cath Lab visit)  Clinical Evaluation Leading to the Procedure:   ACS: No.  Non-ACS:    Anginal Classification: CCS II  Anti-ischemic medical therapy: Maximal Therapy (2 or more classes of medications)  Non-Invasive Test Results: No non-invasive testing performed  Prior CABG: No previous CABG         Lauree Chandler

## 2018-09-02 ENCOUNTER — Encounter: Payer: Self-pay | Admitting: Gastroenterology

## 2018-09-02 ENCOUNTER — Other Ambulatory Visit: Payer: Self-pay

## 2018-09-02 ENCOUNTER — Ambulatory Visit (INDEPENDENT_AMBULATORY_CARE_PROVIDER_SITE_OTHER): Payer: Medicare Other | Admitting: Gastroenterology

## 2018-09-02 VITALS — BP 115/64 | HR 72 | Temp 97.2°F | Ht 69.0 in | Wt 318.8 lb

## 2018-09-02 DIAGNOSIS — K3184 Gastroparesis: Secondary | ICD-10-CM

## 2018-09-02 DIAGNOSIS — E1143 Type 2 diabetes mellitus with diabetic autonomic (poly)neuropathy: Secondary | ICD-10-CM

## 2018-09-02 DIAGNOSIS — K5900 Constipation, unspecified: Secondary | ICD-10-CM | POA: Diagnosis not present

## 2018-09-02 DIAGNOSIS — K219 Gastro-esophageal reflux disease without esophagitis: Secondary | ICD-10-CM

## 2018-09-02 NOTE — Assessment & Plan Note (Signed)
Continue Nexium daily, pepcid prn in evenings. Return in 6-8 months.

## 2018-09-02 NOTE — Progress Notes (Signed)
Referring Provider: Glenda Chroman, MD Primary Care Physician:  Glenda Chroman, MD Primary GI: Dr. Gala Romney   Chief Complaint  Patient presents with  . Gastroesophageal Reflux    NEXIUM & PEPSID DAILY  . Constipation    Maxine Glenn, MOTEGRITY    HPI:   [name] [name] is a 58 y.o. female presenting today with a history of OIC, GERD, gastroparesis. Returning for routine follow-up. Last visit in May 2020 virtually.   Constipation: Movantik without help in the past. Amitiza worsened constipation per patient. Now regimen includes Motegrity, Linzess 290 mcg. Taking 1 tbsp Lactulose. 1 capful Miralax, sometimes 2 scoops. Two stool softeners a day. This regimen has helped with constipation. Colonoscopy due again in 2023.   GERD: Nexium daily and Pepcid prn. Nexium controls symptoms for most part. No dysphagia. No unexplained weight loss or lack of appetite.   Gastroparesis: nausea but no vomiting. Zofran prn.   Recently underwent cardiac cath with following findings:   The left ventricular systolic function is normal.  The left ventricular ejection fraction is 55-65% by visual estimate.   1. No angiographic evidence of CAD 2. Mildly elevated left ventricular filling pressures 3. Dyspnea likely multifactorial due to COPD, obesity and physical deconditioning with chronic diastolic CHF  No further ischemic workup.       Past Medical History:  Diagnosis Date  . Bladder dysfunction    Self urinary catheterization  . COPD (chronic obstructive pulmonary disease) ([place])   . DDD (degenerative disc disease)   . Depression   . Esophageal spasm    NTG and Norvasc  . Essential hypertension, benign   . Fibromyalgia   . Gastroparesis   . GERD (gastroesophageal reflux disease)   . History of cardiac catheterization    Normal coronary arteries 11/12  . History of pituitary tumor   . Irritable bowel syndrome   . Lymphedema   . Osteoarthritis   . PAF (paroxysmal atrial  fibrillation) ([place])   . Sleep apnea    BIPAP  . Type 2 diabetes mellitus ([place])   . Urinary tract infection     Past Surgical History:  Procedure Laterality Date  . ABDOMINAL HYSTERECTOMY    . ANTERIOR CERVICAL DECOMP/DISCECTOMY FUSION N/A 12/02/2014   Procedure: Cervical five cervical six anterior cervical decompression with fusion interbody prosthesis plating and bone graft;  Surgeon: Newman Pies, MD;  Location: [place] [facility];  Service: Neurosurgery;  Laterality: N/A;  C56 anterior cervical decompression with fusion interbody prosthesis plating and bonegraft  . APPENDECTOMY    . BACK SURGERY    . CARDIAC CATHETERIZATION    . CARDIOVERSION N/A 03/26/2014   Procedure: CARDIOVERSION;  Surgeon: Herminio Commons, MD;  Location: [facility];  Service: Endoscopy;  Laterality: N/A;  . CARDIOVERSION N/A 05/04/2014   Procedure: CARDIOVERSION;  Surgeon: Satira Sark, MD;  Location: [facility];  Service: Cardiovascular;  Laterality: N/A;  . CHOLECYSTECTOMY    . COLONOSCOPY     Approximately 2003. Per medical records, internal hemorrhoids noted  . COLONOSCOPY N/A 06/25/2013   Dr. Gala Romney: Anal canal hemorrhoids-more likely the source of paper hematochezia. Redundant, capacious colon. Multiple colonic polyps-tubular adenoma  . COLONOSCOPY WITH PROPOFOL N/A 12/04/2016   Dr. Gala Romney: redundant colon, colonic diverticulosis. Screening due in 2023  . ENDOVENOUS ABLATION SAPHENOUS VEIN W/ LASER Left 03/07/2018   endovenous ablation left greater saphenous vein by Deitra Mayo MD   . ESOPHAGOGASTRODUODENOSCOPY  10/12/2004   [id number] plaquing on the esophageal mucosa  of uncertain significance, not typical of what is seen with candida esophagitis status post KOH brushing for KOH prep and biopsy for histology. Rule out candida esophagitis/eosinophilic esophagitis. Otherwise normal esophagus. Tiny hiatal hernia. Otherwise, normal stomach, normal D1 and D2. Benign biopsy of esophagus, unknown KOH status.   .  ESOPHAGOGASTRODUODENOSCOPY N/A 06/25/2013   Dr. Rourk:mild chronic gastritis  . KNEE SURGERY    . LEFT HEART CATHETERIZATION WITH CORONARY ANGIOGRAM N/A 11/15/2010   Procedure: LEFT HEART CATHETERIZATION WITH CORONARY ANGIOGRAM;  Surgeon: Laverda Page, MD;  [facility] C[facility] LAB;  Service: Cardiovascular;  Laterality: N/A;  . LUNG BIOPSY    . RIGHT/LEFT HEART CATH AND CORONARY ANGIOGRAPHY N/A 08/28/2018   Procedure: RIGHT/LEFT HEART CATH AND CORONARY ANGIOGRAPHY;  Surgeon: Burnell Blanks, MD;  Location: [facility] CV LAB;  Service: Cardiovascular;  Laterality: N/A;    Current Outpatient Medications  Medication Sig Dispense Refill  . albuterol (PROAIR HFA) 108 (90 BASE) MCG/ACT inhaler Inhale 2 puffs into the lungs every 6 (six) hours as needed for wheezing or shortness of breath.     Marland Kitchen albuterol (PROVENTIL) (2.5 MG/3ML) 0.083% nebulizer solution Take 2.5 mg by nebulization every 6 (six) hours as needed for wheezing. For shortness of breath.    Marland Kitchen atorvastatin (LIPITOR) 10 MG tablet Take 10 mg by mouth at bedtime.  3  . baclofen (LIORESAL) 20 MG tablet Take 20 mg by mouth 3 (three) times daily.      Marland Kitchen buPROPion (WELLBUTRIN) 75 MG tablet Take 1 tablet (75 mg total) by mouth daily. 30 tablet 3  . CONSTULOSE 10 GM/15ML solution TAKE 15MLS BY MOUTH DAILY (Patient taking differently: Take 10 g by mouth daily. ) 473 mL 5  . diclofenac sodium (VOLTAREN) 1 % GEL Apply 2-4 g topically 4 (four) times daily as needed (for pain).     Marland Kitchen diltiazem (CARDIZEM CD) 120 MG 24 hr capsule TAKE ONE CAPSULE BY MOUTH EVERY DAY (Patient taking differently: Take 120 mg by mouth daily. ) 30 capsule 2  . docusate sodium (COLACE) 100 MG capsule Take 100 mg by mouth 2 (two) times daily.    . famotidine (PEPCID) 20 MG tablet Take 1 tablet (20 mg total) by mouth daily as needed for heartburn or indigestion. 30 tablet 3  . flecainide (TAMBOCOR) 50 MG tablet TAKE TWO TABLETS BY MOUTH EVERY MORNING and TAKE TWO  TABLETS BY MOUTH EVERY EVENING (Patient taking differently: Take 100 mg by mouth 2 (two) times daily. ) 120 tablet 6  . FLOVENT DISKUS 100 MCG/BLIST AEPB Inhale 1 puff into the lungs 2 (two) times daily.    . fluticasone (FLONASE) 50 MCG/ACT nasal spray Place 2 sprays into both nostrils daily.  6  . furosemide (LASIX) 80 MG tablet Take 80 mg by mouth 2 (two) times daily.      Marland Kitchen gabapentin (NEURONTIN) 800 MG tablet Take 800 mg by mouth 4 (four) times daily.   2  . glimepiride (AMARYL) 4 MG tablet Take 4 mg by mouth 2 (two) times daily.    . hydrocortisone (ANUSOL-HC) 2.5 % rectal cream Place 1 application rectally 2 (two) times daily. (Patient taking differently: Place 1 application rectally 2 (two) times daily as needed for hemorrhoids. ) 30 g 1  . isosorbide mononitrate (IMDUR) 120 MG 24 hr tablet Take 120 mg by mouth daily. In the morning    . isosorbide mononitrate (IMDUR) 30 MG 24 hr tablet Take 30 mg by mouth every evening.     Marland Kitchen  LINZESS 290 MCG CAPS capsule TAKE ONE CAPSULE BY MOUTH EVERY DAY BEFORE BREAKFAST - REPLACES [place] (Patient taking differently: Take 290 mcg by mouth daily before breakfast. ) 30 capsule 5  . lisinopril (PRINIVIL,ZESTRIL) 10 MG tablet Take 10 mg by mouth every evening.     . loratadine (CLARITIN) 10 MG tablet Take 10 mg by mouth daily.      . meclizine (ANTIVERT) 25 MG tablet Take 25 mg by mouth 3 (three) times daily as needed for dizziness.     . metFORMIN (GLUCOPHAGE) 500 MG tablet Take 1,000 mg by mouth 2 (two) times daily.     . metolazone (ZAROXOLYN) 2.5 MG tablet Take 2.5 mg by mouth daily with breakfast.     . Multiple Vitamin (MULTIVITAMIN WITH MINERALS) TABS tablet Take 1 tablet by mouth daily.    Marland Kitchen NEXIUM 40 MG capsule TAKE ONE CAPSULE BY MOUTH EVERY DAY BEFORE BREAKFAST (seven IN THE MORNING PER pt) (Patient taking differently: Take 40 mg by mouth daily before breakfast. 1 hour before breakfast) 90 capsule 3  . nitroGLYCERIN (NITROLINGUAL) 0.4 MG/SPRAY  spray USE 1 SPRAY UNDER THE TONGUE AS NEEDED FOR CHEST PAIN - PA SENT 11/28/2016- JB (Patient taking differently: Place 1 spray under the tongue every 5 (five) minutes x 3 doses as needed for chest pain. ) 12 g 3  . NUCYNTA ER 200 MG TB12 Take 200 mg by mouth every 12 (twelve) hours.     Marland Kitchen nystatin (MYCOSTATIN/NYSTOP) 100000 UNIT/GM POWD Apply 1 g topically daily. After bathing    . ondansetron (ZOFRAN) 4 MG tablet TAKE 4 MG BY MOUTH EVERY 8 HOURS AS NEEDED FOR NAUSEA OR VOMITING (Patient taking differently: Take 4 mg by mouth every 8 (eight) hours as needed (nausea/vomiting). ) 30 tablet 3  . oxyCODONE-acetaminophen (PERCOCET) 10-325 MG per tablet Take 1 tablet by mouth every 6 (six) hours as needed for pain.     . polyethylene glycol powder (GLYCOLAX/MIRALAX) powder Take 17-34 g by mouth daily.     . potassium chloride SA (K-DUR,KLOR-CON) 20 MEQ tablet Take 40 mEq by mouth 5 (five) times daily.     . Prucalopride Succinate (MOTEGRITY) 2 MG TABS Take 2 mg by mouth daily. 30 tablet 5  . Rivaroxaban (XARELTO) 20 MG TABS Take 20 mg by mouth daily with supper.     . Semaglutide,0.25 or 0.5MG /DOS, (OZEMPIC, 0.25 OR 0.5 MG/DOSE,) 2 MG/1.5ML SOPN Inject 0.25 mg into the skin every Tuesday. At lunch    . spironolactone (ALDACTONE) 50 MG tablet Take 50 mg by mouth 3 (three) times daily.      . TRESIBA FLEXTOUCH 200 UNIT/ML SOPN Inject 46 Units into the skin at bedtime.    Marland Kitchen VASCEPA 1 g CAPS Take 1 g by mouth 4 (four) times daily.     Current Facility-Administered Medications  Medication Dose Route Frequency Provider Last Rate Last Dose  . sodium chloride flush (NS) 0.9 % injection 3 mL  3 mL Intravenous Q12H Satira Sark, MD        Allergies as of 09/02/2018 - Review Complete 09/02/2018  Allergen Reaction Noted  . Levofloxacin Other (See Comments) 08/17/2011  . Tape Other (See Comments) 11/21/2016  . Hyoscyamine sulfate Other (See Comments)   . Metoclopramide Other (See Comments) 08/17/2011     Family History  Problem Relation Age of Onset  . Coronary artery disease Father        Died age 43  . Heart attack Father   .  Arrhythmia Father        AF  . Diabetes Father   . Parkinson's disease Father   . Arrhythmia Mother        AF  . Stroke Mother   . Dementia Mother   . Cancer Mother        UTERINE   . Parkinson's disease Mother   . Arrhythmia Brother        AF  . Colon cancer Maternal Grandfather   . Colon cancer Paternal Aunt   . Colon cancer Paternal Aunt   . Diabetes Son   . Cancer Sister        BREAST   . Depression Sister   . Depression Sister   . Depression Sister     Social History   Socioeconomic History  . Marital status: Divorced    Spouse name: Not on file  . Number of children: Not on file  . Years of education: Not on file  . Highest education level: Not on file  Occupational History  . Not on file  Social Needs  . Financial resource strain: Not on file  . Food insecurity    Worry: Not on file    Inability: Not on file  . Transportation needs    Medical: Not on file    Non-medical: Not on file  Tobacco Use  . Smoking status: Former Smoker    Packs/day: 1.50    Years: 30.00    Pack years: 45.00    Types: Cigarettes    Start date: 03/06/1977    Quit date: 01/03/2007    Years since quitting: 11.6  . Smokeless tobacco: Never Used  Substance and Sexual Activity  . Alcohol use: No    Alcohol/week: 0.0 standard drinks  . Drug use: No  . Sexual activity: Never    Birth control/protection: Surgical  Lifestyle  . Physical activity    Days per week: Not on file    Minutes per session: Not on file  . Stress: Not on file  Relationships  . Social Herbalist on phone: Not on file    Gets together: Not on file    Attends religious service: Not on file    Active member of club or organization: Not on file    Attends meetings of clubs or organizations: Not on file    Relationship status: Not on file  Other Topics Concern  . Not  on file  Social History Narrative  . Not on file    Review of Systems: Gen: Denies fever, chills, anorexia. Denies fatigue, weakness, weight loss.  CV: Denies chest pain, palpitations, syncope, peripheral edema, and claudication. Resp: Denies dyspnea at rest, cough, wheezing, coughing up blood, and pleurisy. GI: see HPI Derm: Denies rash, itching, dry skin Psych: Denies depression, anxiety, memory loss, confusion. No homicidal or suicidal ideation.  Heme: Denies bruising, bleeding, and enlarged lymph nodes.  Physical Exam: BP 115/64   Pulse 72   Temp (!) 97.2 F (36.2 C) (Oral)   Ht 5\' 9"  (1.753 m)   Wt (!) 318 lb 12.8 oz (144.6 kg)   BMI 47.08 kg/m  General:   Alert and oriented. No distress noted. Pleasant and cooperative.  Head:  Normocephalic and atraumatic. Abdomen:  +BS, soft, non-tender and non-distended. No rebound or guarding. No HSM or masses noted. Msk:  Symmetrical without gross deformities. Normal posture. Extremities:  With 2+ lower extremity edema Neurologic:  Alert and  oriented x4 Psych:  Alert and  cooperative. Normal mood and affect.

## 2018-09-02 NOTE — Assessment & Plan Note (Signed)
Continue aggressive regimen of Motegrity, Linzess 290 mcg. Taking 1 tbsp Lactulose. 1 capful Miralax, sometimes 2 scoops. Two stool softeners a day. History of OIC. She has found the best results with this regimen. Will continue. Next colonoscopy 2023. Return in 6-8 months.

## 2018-09-02 NOTE — Patient Instructions (Signed)
Please continue Nexium each morning with Pepcid in evening as needed.  Please call us if the current bowel regimen you are on it is not effective!  We will see you in 6-8 months!  I enjoyed seeing you again today! As you know, I value our relationship and want to provide genuine, compassionate, and quality care. I welcome your feedback. If you receive a survey regarding your visit,  I greatly appreciate you taking time to fill this out. See you next time!  Annitta Needs, PhD, ANP-BC [facility]

## 2018-09-02 NOTE — Assessment & Plan Note (Signed)
Without concerning signs/symptoms. Continue acid suppression therapy. Zofran prn.

## 2018-09-03 ENCOUNTER — Encounter: Payer: Self-pay | Admitting: Internal Medicine

## 2018-09-03 DIAGNOSIS — I2510 Atherosclerotic heart disease of native coronary artery without angina pectoris: Secondary | ICD-10-CM | POA: Diagnosis not present

## 2018-09-03 DIAGNOSIS — I10 Essential (primary) hypertension: Secondary | ICD-10-CM | POA: Diagnosis not present

## 2018-09-03 DIAGNOSIS — E119 Type 2 diabetes mellitus without complications: Secondary | ICD-10-CM | POA: Diagnosis not present

## 2018-09-04 ENCOUNTER — Encounter (HOSPITAL_COMMUNITY): Payer: Self-pay | Admitting: Cardiovascular Disease

## 2018-09-10 DIAGNOSIS — L84 Corns and callosities: Secondary | ICD-10-CM | POA: Diagnosis not present

## 2018-09-10 DIAGNOSIS — B351 Tinea unguium: Secondary | ICD-10-CM | POA: Diagnosis not present

## 2018-09-10 DIAGNOSIS — M79676 Pain in unspecified toe(s): Secondary | ICD-10-CM | POA: Diagnosis not present

## 2018-09-10 DIAGNOSIS — E1142 Type 2 diabetes mellitus with diabetic polyneuropathy: Secondary | ICD-10-CM | POA: Diagnosis not present

## 2018-09-17 DIAGNOSIS — N312 Flaccid neuropathic bladder, not elsewhere classified: Secondary | ICD-10-CM | POA: Diagnosis not present

## 2018-09-18 DIAGNOSIS — E1142 Type 2 diabetes mellitus with diabetic polyneuropathy: Secondary | ICD-10-CM | POA: Diagnosis not present

## 2018-09-18 DIAGNOSIS — G894 Chronic pain syndrome: Secondary | ICD-10-CM | POA: Diagnosis not present

## 2018-09-18 DIAGNOSIS — Z79891 Long term (current) use of opiate analgesic: Secondary | ICD-10-CM | POA: Diagnosis not present

## 2018-09-18 DIAGNOSIS — M48061 Spinal stenosis, lumbar region without neurogenic claudication: Secondary | ICD-10-CM | POA: Diagnosis not present

## 2018-09-20 DIAGNOSIS — M170 Bilateral primary osteoarthritis of knee: Secondary | ICD-10-CM | POA: Diagnosis not present

## 2018-09-27 DIAGNOSIS — M170 Bilateral primary osteoarthritis of knee: Secondary | ICD-10-CM | POA: Diagnosis not present

## 2018-10-01 DIAGNOSIS — Z713 Dietary counseling and surveillance: Secondary | ICD-10-CM | POA: Diagnosis not present

## 2018-10-01 DIAGNOSIS — Z6841 Body Mass Index (BMI) 40.0 and over, adult: Secondary | ICD-10-CM | POA: Diagnosis not present

## 2018-10-01 DIAGNOSIS — I10 Essential (primary) hypertension: Secondary | ICD-10-CM | POA: Diagnosis not present

## 2018-10-01 DIAGNOSIS — E1165 Type 2 diabetes mellitus with hyperglycemia: Secondary | ICD-10-CM | POA: Diagnosis not present

## 2018-10-01 DIAGNOSIS — Z23 Encounter for immunization: Secondary | ICD-10-CM | POA: Diagnosis not present

## 2018-10-01 DIAGNOSIS — Z299 Encounter for prophylactic measures, unspecified: Secondary | ICD-10-CM | POA: Diagnosis not present

## 2018-10-04 ENCOUNTER — Ambulatory Visit: Payer: Medicare Other | Admitting: Cardiology

## 2018-10-04 DIAGNOSIS — M170 Bilateral primary osteoarthritis of knee: Secondary | ICD-10-CM | POA: Diagnosis not present

## 2018-10-06 ENCOUNTER — Other Ambulatory Visit: Payer: Self-pay | Admitting: Cardiology

## 2018-10-06 ENCOUNTER — Other Ambulatory Visit: Payer: Self-pay | Admitting: Gastroenterology

## 2018-10-10 DIAGNOSIS — I10 Essential (primary) hypertension: Secondary | ICD-10-CM | POA: Diagnosis not present

## 2018-10-10 DIAGNOSIS — I2510 Atherosclerotic heart disease of native coronary artery without angina pectoris: Secondary | ICD-10-CM | POA: Diagnosis not present

## 2018-10-10 DIAGNOSIS — E119 Type 2 diabetes mellitus without complications: Secondary | ICD-10-CM | POA: Diagnosis not present

## 2018-10-17 DIAGNOSIS — G894 Chronic pain syndrome: Secondary | ICD-10-CM | POA: Diagnosis not present

## 2018-10-17 DIAGNOSIS — E1142 Type 2 diabetes mellitus with diabetic polyneuropathy: Secondary | ICD-10-CM | POA: Diagnosis not present

## 2018-10-17 DIAGNOSIS — M48061 Spinal stenosis, lumbar region without neurogenic claudication: Secondary | ICD-10-CM | POA: Diagnosis not present

## 2018-10-17 DIAGNOSIS — Z79891 Long term (current) use of opiate analgesic: Secondary | ICD-10-CM | POA: Diagnosis not present

## 2018-10-24 DIAGNOSIS — Z13820 Encounter for screening for osteoporosis: Secondary | ICD-10-CM | POA: Diagnosis not present

## 2018-10-24 DIAGNOSIS — Z8262 Family history of osteoporosis: Secondary | ICD-10-CM | POA: Diagnosis not present

## 2018-10-24 DIAGNOSIS — E2839 Other primary ovarian failure: Secondary | ICD-10-CM | POA: Diagnosis not present

## 2018-10-24 DIAGNOSIS — M1990 Unspecified osteoarthritis, unspecified site: Secondary | ICD-10-CM | POA: Diagnosis not present

## 2018-10-24 DIAGNOSIS — Z780 Asymptomatic menopausal state: Secondary | ICD-10-CM | POA: Diagnosis not present

## 2018-11-07 ENCOUNTER — Other Ambulatory Visit: Payer: Self-pay | Admitting: Gastroenterology

## 2018-11-07 ENCOUNTER — Other Ambulatory Visit: Payer: Self-pay | Admitting: Cardiology

## 2018-11-10 ENCOUNTER — Encounter: Payer: Self-pay | Admitting: Cardiology

## 2018-11-10 NOTE — Progress Notes (Deleted)
Cardiology Office Note  Date: 11/10/2018   ID: YALEXA [name], DOB 1960/06/12, MRN [id number]  PCP:  Glenda Chroman, MD  Cardiologist:  Rozann Lesches, MD Electrophysiologist:  None   No chief complaint on file.   History of Present Illness: [name] [name] is a 58 y.o. female last assessed via telehealth encounter in August.  She was referred for a follow-up diagnostic cardiac catheterization reporting progressive dyspnea on exertion in the setting of active cardiac risk factors and otherwise well-controlled PAF.  By Dr. Angelena Form me demonstrating no angiographic evidence of CAD with mildly elevated LV filling pressures and normal LVEF.  Past Medical History:  Diagnosis Date  . Bladder dysfunction    Self urinary catheterization  . COPD (chronic obstructive pulmonary disease) ([place])   . DDD (degenerative disc disease)   . Depression   . Esophageal spasm    NTG and Norvasc  . Essential hypertension   . Fibromyalgia   . Gastroparesis   . GERD (gastroesophageal reflux disease)   . History of cardiac catheterization    Normal coronary arteries 11/12  . History of pituitary tumor   . Irritable bowel syndrome   . Lymphedema   . Osteoarthritis   . PAF (paroxysmal atrial fibrillation) ([place])   . Sleep apnea    BIPAP  . Type 2 diabetes mellitus ([place])   . Urinary tract infection     Past Surgical History:  Procedure Laterality Date  . ABDOMINAL HYSTERECTOMY    . ANTERIOR CERVICAL DECOMP/DISCECTOMY FUSION N/A 12/02/2014   Procedure: Cervical five cervical six anterior cervical decompression with fusion interbody prosthesis plating and bone graft;  Surgeon: Newman Pies, MD;  Location: [facility];  Service: Neurosurgery;  Laterality: N/A;  C56 anterior cervical decompression with fusion interbody prosthesis plating and bonegraft  . APPENDECTOMY    . BACK SURGERY    . CARDIAC CATHETERIZATION    . CARDIOVERSION N/A 03/26/2014   Procedure: CARDIOVERSION;  Surgeon: Herminio Commons, MD;  Location: [facility];  Service: Endoscopy;  Laterality: N/A;  . CARDIOVERSION N/A 05/04/2014   Procedure: CARDIOVERSION;  Surgeon: Satira Sark, MD;  Location: [facility];  Service: Cardiovascular;  Laterality: N/A;  . CHOLECYSTECTOMY    . COLONOSCOPY     Approximately 2003. Per medical records, internal hemorrhoids noted  . COLONOSCOPY N/A 06/25/2013   Dr. Gala Romney: Anal canal hemorrhoids-more likely the source of paper hematochezia. Redundant, capacious colon. Multiple colonic polyps-tubular adenoma  . COLONOSCOPY WITH PROPOFOL N/A 12/04/2016   Dr. Gala Romney: redundant colon, colonic diverticulosis. Screening due in 2023  . ENDOVENOUS ABLATION SAPHENOUS VEIN W/ LASER Left 03/07/2018   endovenous ablation left greater saphenous vein by Deitra Mayo MD   . ESOPHAGOGASTRODUODENOSCOPY  10/12/2004   [id number] plaquing on the esophageal mucosa of uncertain significance, not typical of what is seen with candida esophagitis status post KOH brushing for KOH prep and biopsy for histology. Rule out candida esophagitis/eosinophilic esophagitis. Otherwise normal esophagus. Tiny hiatal hernia. Otherwise, normal stomach, normal D1 and D2. Benign biopsy of esophagus, unknown KOH status.   . ESOPHAGOGASTRODUODENOSCOPY N/A 06/25/2013   Dr. Rourk:mild chronic gastritis  . KNEE SURGERY    . LEFT HEART CATHETERIZATION WITH CORONARY ANGIOGRAM N/A 11/15/2010   Procedure: LEFT HEART CATHETERIZATION WITH CORONARY ANGIOGRAM;  Surgeon: Laverda Page, MD;  Location: [facility];  Service: Cardiovascular;  Laterality: N/A;  . LUNG BIOPSY    . RIGHT/LEFT HEART CATH AND CORONARY ANGIOGRAPHY N/A 08/28/2018   Procedure: RIGHT/LEFT  HEART CATH AND CORONARY ANGIOGRAPHY;  Surgeon: Burnell Blanks, MD;  Location: [facility];  Service: Cardiovascular;  Laterality: N/A;    Current Outpatient Medications  Medication Sig Dispense Refill  . albuterol (PROAIR HFA) 108 (90 BASE) MCG/ACT inhaler Inhale 2  puffs into the lungs every 6 (six) hours as needed for wheezing or shortness of breath.     Marland Kitchen albuterol (PROVENTIL) (2.5 MG/3ML) 0.083% nebulizer solution Take 2.5 mg by nebulization every 6 (six) hours as needed for wheezing. For shortness of breath.    Marland Kitchen atorvastatin (LIPITOR) 10 MG tablet Take 10 mg by mouth at bedtime.  3  . baclofen (LIORESAL) 20 MG tablet Take 20 mg by mouth 3 (three) times daily.      Marland Kitchen buPROPion (WELLBUTRIN) 75 MG tablet Take 1 tablet (75 mg total) by mouth daily. 30 tablet 3  . CONSTULOSE 10 GM/15ML solution TAKE 15MLS BY MOUTH DAILY (Patient taking differently: Take 10 g by mouth daily. ) 473 mL 5  . diclofenac sodium (VOLTAREN) 1 % GEL Apply 2-4 g topically 4 (four) times daily as needed (for pain).     Marland Kitchen diltiazem (CARDIZEM CD) 120 MG 24 hr capsule Take 1 capsule (120 mg total) by mouth daily. 90 capsule 3  . docusate sodium (COLACE) 100 MG capsule Take 100 mg by mouth 2 (two) times daily.    . famotidine (PEPCID) 20 MG tablet TAKE 1 TABLET BY MOUTH EVERY DAY AS NEEDED FOR HEARTBURN OR INDIGESTION 30 tablet 3  . flecainide (TAMBOCOR) 50 MG tablet TAKE TWO TABLETS BY MOUTH EVERY MORNING and TAKE TWO TABLETS BY MOUTH EVERY EVENING 120 tablet 6  . FLOVENT DISKUS 100 MCG/BLIST AEPB Inhale 1 puff into the lungs 2 (two) times daily.    . fluticasone (FLONASE) 50 MCG/ACT nasal spray Place 2 sprays into both nostrils daily.  6  . furosemide (LASIX) 80 MG tablet Take 80 mg by mouth 2 (two) times daily.      Marland Kitchen gabapentin (NEURONTIN) 800 MG tablet Take 800 mg by mouth 4 (four) times daily.   2  . glimepiride (AMARYL) 4 MG tablet Take 4 mg by mouth 2 (two) times daily.    . hydrocortisone (ANUSOL-HC) 2.5 % rectal cream Place 1 application rectally 2 (two) times daily. (Patient taking differently: Place 1 application rectally 2 (two) times daily as needed for hemorrhoids. ) 30 g 1  . isosorbide mononitrate (IMDUR) 120 MG 24 hr tablet Take 120 mg by mouth daily. In the morning    .  isosorbide mononitrate (IMDUR) 30 MG 24 hr tablet Take 30 mg by mouth every evening.     Marland Kitchen LINZESS 290 MCG CAPS capsule TAKE ONE CAPSULE BY MOUTH EVERY DAY BEFORE BREAKFAST - REPLACES MOVANTIK 30 capsule 5  . lisinopril (PRINIVIL,ZESTRIL) 10 MG tablet Take 10 mg by mouth every evening.     . loratadine (CLARITIN) 10 MG tablet Take 10 mg by mouth daily.      . meclizine (ANTIVERT) 25 MG tablet Take 25 mg by mouth 3 (three) times daily as needed for dizziness.     . metFORMIN (GLUCOPHAGE) 500 MG tablet Take 1,000 mg by mouth 2 (two) times daily.     . metolazone (ZAROXOLYN) 2.5 MG tablet Take 2.5 mg by mouth daily with breakfast.     . MOTEGRITY 2 MG TABS TAKE 1 TABLET BY MOUTH DAILY 30 tablet 5  . Multiple Vitamin (MULTIVITAMIN WITH MINERALS) TABS tablet Take 1 tablet by mouth  daily.    Marland Kitchen NEXIUM 40 MG capsule TAKE ONE CAPSULE BY MOUTH EVERY DAY BEFORE BREAKFAST (seven IN THE MORNING PER pt) (Patient taking differently: Take 40 mg by mouth daily before breakfast. 1 hour before breakfast) 90 capsule 3  . nitroGLYCERIN (NITROLINGUAL) 0.4 MG/SPRAY spray USE 1 SPRAY UNDER THE TONGUE AS NEEDED FOR CHEST PAIN - PA SENT 11/28/2016- JB (Patient taking differently: Place 1 spray under the tongue every 5 (five) minutes x 3 doses as needed for chest pain. ) 12 g 3  . NUCYNTA ER 200 MG TB12 Take 200 mg by mouth every 12 (twelve) hours.     Marland Kitchen nystatin (MYCOSTATIN/NYSTOP) 100000 UNIT/GM POWD Apply 1 g topically daily. After bathing    . ondansetron (ZOFRAN) 4 MG tablet TAKE 4 MG BY MOUTH EVERY 8 HOURS AS NEEDED FOR NAUSEA OR VOMITING (Patient taking differently: Take 4 mg by mouth every 8 (eight) hours as needed (nausea/vomiting). ) 30 tablet 3  . oxyCODONE-acetaminophen (PERCOCET) 10-325 MG per tablet Take 1 tablet by mouth every 6 (six) hours as needed for pain.     . polyethylene glycol powder (GLYCOLAX/MIRALAX) powder Take 17-34 g by mouth daily.     . potassium chloride SA (K-DUR,KLOR-CON) 20 MEQ tablet Take 40  mEq by mouth 5 (five) times daily.     . Rivaroxaban (XARELTO) 20 MG TABS Take 20 mg by mouth daily with supper.     . Semaglutide,0.25 or 0.5MG /DOS, (OZEMPIC, 0.25 OR 0.5 MG/DOSE,) 2 MG/1.5ML SOPN Inject 0.25 mg into the skin every Tuesday. At lunch    . spironolactone (ALDACTONE) 50 MG tablet Take 50 mg by mouth 3 (three) times daily.      . TRESIBA FLEXTOUCH 200 UNIT/ML SOPN Inject 46 Units into the skin at bedtime.    Marland Kitchen VASCEPA 1 g CAPS Take 1 g by mouth 4 (four) times daily.     Current Facility-Administered Medications  Medication Dose Route Frequency Provider Last Rate Last Dose  . sodium chloride flush (NS) 0.9 % injection 3 mL  3 mL Intravenous Q12H Satira Sark, MD       Allergies:  Levofloxacin, Tape, Hyoscyamine sulfate, and Metoclopramide   Social History: The patient  reports that she quit smoking about 11 years ago. Her smoking use included cigarettes. She started smoking about 41 years ago. She has a 45.00 pack-year smoking history. She has never used smokeless tobacco. She reports that she does not drink alcohol or use drugs.   Family History: The patient's family history includes Arrhythmia in her brother, father, and mother; Cancer in her mother and sister; Colon cancer in her maternal grandfather, paternal aunt, and paternal aunt; Coronary artery disease in her father; Dementia in her mother; Depression in her sister, sister, and sister; Diabetes in her father and son; Heart attack in her father; Parkinson's disease in her father and mother; Stroke in her mother.   ROS:  Please see the history of present illness. Otherwise, complete review of systems is positive for {NONE DEFAULTED:18576::"none"}.  All other systems are reviewed and negative.   Physical Exam: VS:  There were no vitals taken for this visit., BMI There is no height or weight on file to calculate BMI.  Wt Readings from Last 3 Encounters:  09/02/18 (!) 318 lb 12.8 oz (144.6 kg)  08/28/18 (!) 320 lb  (145.2 kg)  08/22/18 (!) 321 lb (145.6 kg)    General: Patient appears comfortable at rest. HEENT: Conjunctiva and lids normal, oropharynx clear with  moist mucosa. Neck: Supple, no elevated JVP or carotid bruits, no thyromegaly. Lungs: Clear to auscultation, nonlabored breathing at rest. Cardiac: Regular rate and rhythm, no S3 or significant systolic murmur, no pericardial rub. Abdomen: Soft, nontender, no hepatomegaly, bowel sounds present, no guarding or rebound. Extremities: No pitting edema, distal pulses 2+. Skin: Warm and dry. Musculoskeletal: No kyphosis. Neuropsychiatric: Alert and oriented x3, affect grossly appropriate.  ECG:  An ECG dated 08/16/2018 was personally reviewed today and demonstrated:  Sinus rhythm with prolonged PR interval and IVCD.  Recent Labwork: 08/16/2018: B Natriuretic Peptide 25.0; BUN 21; Creatinine, Ser 0.65; Platelets 380 08/28/2018: Hemoglobin 10.5; Potassium 3.6; Sodium 140   Other Studies Reviewed Today:  Cardiac catheterization 08/28/2018:  The left ventricular systolic function is normal.  The left ventricular ejection fraction is 55-65% by visual estimate.   1. No angiographic evidence of CAD 2. Mildly elevated left ventricular filling pressures 3. Dyspnea likely multifactorial due to COPD, obesity and physical deconditioning with chronic diastolic CHF  No further ischemic workup.   Assessment and Plan:    Medication Adjustments/Labs and Tests Ordered: Current medicines are reviewed at length with the patient today.  Concerns regarding medicines are outlined above.   Tests Ordered: No orders of the defined types were placed in this encounter.   Medication Changes: No orders of the defined types were placed in this encounter.   Disposition:  Follow up {follow up:15908}  Signed, Satira Sark, MD, [facility] 11/10/2018 2:15 PM    [facility] at [place], [place], [place] [address] Phone: [phone]; Fax: [phone]

## 2018-11-11 ENCOUNTER — Ambulatory Visit: Payer: Medicare Other | Admitting: Cardiology

## 2018-11-13 DIAGNOSIS — Z79891 Long term (current) use of opiate analgesic: Secondary | ICD-10-CM | POA: Diagnosis not present

## 2018-11-13 DIAGNOSIS — E1142 Type 2 diabetes mellitus with diabetic polyneuropathy: Secondary | ICD-10-CM | POA: Diagnosis not present

## 2018-11-13 DIAGNOSIS — M48061 Spinal stenosis, lumbar region without neurogenic claudication: Secondary | ICD-10-CM | POA: Diagnosis not present

## 2018-11-13 DIAGNOSIS — G894 Chronic pain syndrome: Secondary | ICD-10-CM | POA: Diagnosis not present

## 2018-11-19 DIAGNOSIS — E1142 Type 2 diabetes mellitus with diabetic polyneuropathy: Secondary | ICD-10-CM | POA: Diagnosis not present

## 2018-11-19 DIAGNOSIS — B351 Tinea unguium: Secondary | ICD-10-CM | POA: Diagnosis not present

## 2018-11-19 DIAGNOSIS — M79676 Pain in unspecified toe(s): Secondary | ICD-10-CM | POA: Diagnosis not present

## 2018-11-19 DIAGNOSIS — L84 Corns and callosities: Secondary | ICD-10-CM | POA: Diagnosis not present

## 2018-12-10 DIAGNOSIS — E119 Type 2 diabetes mellitus without complications: Secondary | ICD-10-CM | POA: Diagnosis not present

## 2018-12-10 DIAGNOSIS — I2510 Atherosclerotic heart disease of native coronary artery without angina pectoris: Secondary | ICD-10-CM | POA: Diagnosis not present

## 2018-12-10 DIAGNOSIS — I10 Essential (primary) hypertension: Secondary | ICD-10-CM | POA: Diagnosis not present

## 2018-12-12 ENCOUNTER — Encounter: Payer: Self-pay | Admitting: Cardiology

## 2018-12-12 ENCOUNTER — Ambulatory Visit (INDEPENDENT_AMBULATORY_CARE_PROVIDER_SITE_OTHER): Payer: Medicare Other | Admitting: Cardiology

## 2018-12-12 ENCOUNTER — Other Ambulatory Visit: Payer: Self-pay

## 2018-12-12 VITALS — BP 148/52 | HR 77 | Ht 69.0 in | Wt 330.0 lb

## 2018-12-12 DIAGNOSIS — R0602 Shortness of breath: Secondary | ICD-10-CM | POA: Diagnosis not present

## 2018-12-12 DIAGNOSIS — I480 Paroxysmal atrial fibrillation: Secondary | ICD-10-CM | POA: Diagnosis not present

## 2018-12-12 NOTE — Progress Notes (Signed)
Cardiology Office Note  Date: 12/12/2018   ID: [name] [name], DOB [birth year], MRN [id number]  PCP:  Glenda Chroman, MD  Cardiologist:  Rozann Lesches, MD Electrophysiologist:  None   Chief Complaint  Patient presents with  . Cardiac follow-up    History of Present Illness: [name] [name] is a 58 y.o. female last assessed via telehealth encounter in August.  She presents for a follow-up visit.  She remains chronically short of breath, no progressive wheezing.  She states that she has been compliant with BiPAP at nighttime.  She is limited by chronic back and leg pain, uses a cane.  She has not been able to lose weight.  She underwent a follow-up diagnostic cardiac catheterization in August which demonstrated normal coronary arteries, mildly elevated filling pressures, and normal LVEF.  We discussed the results today.  She most likely has a contribution to her shortness of breath related to diastolic dysfunction and also microvascular dysfunction, however this is clearly a multifactorial issue.  She remains on diuretics.  She does not report any palpitations, heart rate is regular today.  Past Medical History:  Diagnosis Date  . Bladder dysfunction    Self urinary catheterization  . COPD (chronic obstructive pulmonary disease) ([place])   . DDD (degenerative disc disease)   . Depression   . Esophageal spasm    NTG and Norvasc  . Essential hypertension   . Fibromyalgia   . Gastroparesis   . GERD (gastroesophageal reflux disease)   . History of cardiac catheterization    Normal coronary arteries  . History of pituitary tumor   . Irritable bowel syndrome   . Lymphedema   . Osteoarthritis   . PAF (paroxysmal atrial fibrillation) ([place])   . Sleep apnea    BIPAP  . Type 2 diabetes mellitus ([place])   . Urinary tract infection     Past Surgical History:  Procedure Laterality Date  . ABDOMINAL HYSTERECTOMY    . ANTERIOR CERVICAL DECOMP/DISCECTOMY FUSION N/A 12/02/2014   Procedure: Cervical five cervical six anterior cervical decompression with fusion interbody prosthesis plating and bone graft;  Surgeon: Newman Pies, MD;  Location: [facility];  Service: Neurosurgery;  Laterality: N/A;  C56 anterior cervical decompression with fusion interbody prosthesis plating and bonegraft  . APPENDECTOMY    . BACK SURGERY    . CARDIAC CATHETERIZATION    . CARDIOVERSION N/A 03/26/2014   Procedure: CARDIOVERSION;  Surgeon: Herminio Commons, MD;  Location: [facility];  Service: Endoscopy;  Laterality: N/A;  . CARDIOVERSION N/A 05/04/2014   Procedure: CARDIOVERSION;  Surgeon: Satira Sark, MD;  Location: [facility];  Service: Cardiovascular;  Laterality: N/A;  . CHOLECYSTECTOMY    . COLONOSCOPY     Approximately 2003. Per medical records, internal hemorrhoids noted  . COLONOSCOPY N/A 06/25/2013   Dr. Gala Romney: Anal canal hemorrhoids-more likely the source of paper hematochezia. Redundant, capacious colon. Multiple colonic polyps-tubular adenoma  . COLONOSCOPY WITH PROPOFOL N/A 12/04/2016   Dr. Gala Romney: redundant colon, colonic diverticulosis. Screening due in 2023  . ENDOVENOUS ABLATION SAPHENOUS VEIN W/ LASER Left 03/07/2018   endovenous ablation left greater saphenous vein by Deitra Mayo MD   . ESOPHAGOGASTRODUODENOSCOPY  10/12/2004   [id number] plaquing on the esophageal mucosa of uncertain significance, not typical of what is seen with candida esophagitis status post KOH brushing for KOH prep and biopsy for histology. Rule out candida esophagitis/eosinophilic esophagitis. Otherwise normal esophagus. Tiny hiatal hernia. Otherwise, normal stomach, normal D1 and D2. Benign  biopsy of esophagus, unknown KOH status.   . ESOPHAGOGASTRODUODENOSCOPY N/A 06/25/2013   Dr. Rourk:mild chronic gastritis  . KNEE SURGERY    . LEFT HEART CATHETERIZATION WITH CORONARY ANGIOGRAM N/A 11/15/2010   Procedure: LEFT HEART CATHETERIZATION WITH CORONARY ANGIOGRAM;  Surgeon: Laverda Page, MD;   Location: [facility] C[facility];  Service: Cardiovascular;  Laterality: N/A;  . LUNG BIOPSY    . RIGHT/LEFT HEART CATH AND CORONARY ANGIOGRAPHY N/A 08/28/2018   Procedure: RIGHT/LEFT HEART CATH AND CORONARY ANGIOGRAPHY;  Surgeon: Burnell Blanks, MD;  Location: [facility];  Service: Cardiovascular;  Laterality: N/A;    Current Outpatient Medications  Medication Sig Dispense Refill  . albuterol (PROAIR HFA) 108 (90 BASE) MCG/ACT inhaler Inhale 2 puffs into the lungs every 6 (six) hours as needed for wheezing or shortness of breath.     Marland Kitchen albuterol (PROVENTIL) (2.5 MG/3ML) 0.083% nebulizer solution Take 2.5 mg by nebulization every 6 (six) hours as needed for wheezing. For shortness of breath.    Marland Kitchen atorvastatin (LIPITOR) 10 MG tablet Take 10 mg by mouth at bedtime.  3  . baclofen (LIORESAL) 20 MG tablet Take 20 mg by mouth 3 (three) times daily.      . CONSTULOSE 10 GM/15ML solution TAKE 15MLS BY MOUTH DAILY (Patient taking differently: Take 10 g by mouth daily. ) 473 mL 5  . diclofenac sodium (VOLTAREN) 1 % GEL Apply 2-4 g topically 4 (four) times daily as needed (for pain).     Marland Kitchen diltiazem (CARDIZEM CD) 120 MG 24 hr capsule Take 1 capsule (120 mg total) by mouth daily. 90 capsule 3  . docusate sodium (COLACE) 100 MG capsule Take 100 mg by mouth 2 (two) times daily.    . famotidine (PEPCID) 20 MG tablet TAKE 1 TABLET BY MOUTH EVERY DAY AS NEEDED FOR HEARTBURN OR INDIGESTION 30 tablet 3  . flecainide (TAMBOCOR) 50 MG tablet TAKE TWO TABLETS BY MOUTH EVERY MORNING and TAKE TWO TABLETS BY MOUTH EVERY EVENING 120 tablet 6  . FLOVENT DISKUS 100 MCG/BLIST AEPB Inhale 1 puff into the lungs 2 (two) times daily.    . fluticasone (FLONASE) 50 MCG/ACT nasal spray Place 2 sprays into both nostrils daily.  6  . furosemide (LASIX) 80 MG tablet Take 80 mg by mouth 2 (two) times daily.      Marland Kitchen gabapentin (NEURONTIN) 800 MG tablet Take 800 mg by mouth 4 (four) times daily.   2  . glimepiride (AMARYL) 4 MG  tablet Take 4 mg by mouth 2 (two) times daily.    . hydrocortisone (ANUSOL-HC) 2.5 % rectal cream Place 1 application rectally 2 (two) times daily. (Patient taking differently: Place 1 application rectally 2 (two) times daily as needed for hemorrhoids. ) 30 g 1  . isosorbide mononitrate (IMDUR) 120 MG 24 hr tablet Take 120 mg by mouth daily. In the morning    . isosorbide mononitrate (IMDUR) 30 MG 24 hr tablet Take 30 mg by mouth every evening.     Marland Kitchen LINZESS 290 MCG CAPS capsule TAKE ONE CAPSULE BY MOUTH EVERY DAY BEFORE BREAKFAST - REPLACES MOVANTIK 30 capsule 5  . lisinopril (PRINIVIL,ZESTRIL) 10 MG tablet Take 10 mg by mouth every evening.     . loratadine (CLARITIN) 10 MG tablet Take 10 mg by mouth daily.      . meclizine (ANTIVERT) 25 MG tablet Take 25 mg by mouth 3 (three) times daily as needed for dizziness.     . metFORMIN (GLUCOPHAGE) 500  MG tablet Take 1,000 mg by mouth 2 (two) times daily.     . metolazone (ZAROXOLYN) 2.5 MG tablet Take 2.5 mg by mouth daily with breakfast.     . MOTEGRITY 2 MG TABS TAKE 1 TABLET BY MOUTH DAILY 30 tablet 5  . Multiple Vitamin (MULTIVITAMIN WITH MINERALS) TABS tablet Take 1 tablet by mouth daily.    Marland Kitchen NEXIUM 40 MG capsule TAKE ONE CAPSULE BY MOUTH EVERY DAY BEFORE BREAKFAST (seven IN THE MORNING PER pt) (Patient taking differently: Take 40 mg by mouth daily before breakfast. 1 hour before breakfast) 90 capsule 3  . nitroGLYCERIN (NITROLINGUAL) 0.4 MG/SPRAY spray USE 1 SPRAY UNDER THE TONGUE AS NEEDED FOR CHEST PAIN - PA SENT 11/28/2016- JB (Patient taking differently: Place 1 spray under the tongue every 5 (five) minutes x 3 doses as needed for chest pain. ) 12 g 3  . NUCYNTA ER 200 MG TB12 Take 200 mg by mouth every 12 (twelve) hours.     Marland Kitchen nystatin (MYCOSTATIN/NYSTOP) 100000 UNIT/GM POWD Apply 1 g topically daily. After bathing    . ondansetron (ZOFRAN) 4 MG tablet TAKE 4 MG BY MOUTH EVERY 8 HOURS AS NEEDED FOR NAUSEA OR VOMITING (Patient taking  differently: Take 4 mg by mouth every 8 (eight) hours as needed (nausea/vomiting). ) 30 tablet 3  . oxyCODONE-acetaminophen (PERCOCET) 10-325 MG per tablet Take 1 tablet by mouth every 6 (six) hours as needed for pain.     . polyethylene glycol powder (GLYCOLAX/MIRALAX) powder Take 17-34 g by mouth daily.     . potassium chloride SA (K-DUR,KLOR-CON) 20 MEQ tablet Take 40 mEq by mouth 5 (five) times daily.     . Rivaroxaban (XARELTO) 20 MG TABS Take 20 mg by mouth daily with supper.     . Semaglutide,0.25 or 0.5MG /DOS, (OZEMPIC, 0.25 OR 0.5 MG/DOSE,) 2 MG/1.5ML SOPN Inject 0.25 mg into the skin every Tuesday. At lunch    . spironolactone (ALDACTONE) 50 MG tablet Take 50 mg by mouth 3 (three) times daily.      . TRESIBA FLEXTOUCH 200 UNIT/ML SOPN Inject 46 Units into the skin at bedtime.    Marland Kitchen VASCEPA 1 g CAPS Take 1 g by mouth 4 (four) times daily.    Marland Kitchen buPROPion (WELLBUTRIN) 75 MG tablet Take 1 tablet (75 mg total) by mouth daily. 30 tablet 3   Current Facility-Administered Medications  Medication Dose Route Frequency Provider Last Rate Last Admin  . sodium chloride flush (NS) 0.9 % injection 3 mL  3 mL Intravenous Q12H Satira Sark, MD       Allergies:  Levofloxacin, Tape, Hyoscyamine sulfate, and Metoclopramide   Social History: The patient  reports that she quit smoking about 11 years ago. Her smoking use included cigarettes. She started smoking about 41 years ago. She has a 45.00 pack-year smoking history. She has never used smokeless tobacco. She reports that she does not drink alcohol or use drugs.   ROS:  Please see the history of present illness. Otherwise, complete review of systems is positive for none.  All other systems are reviewed and negative.   Physical Exam: VS:  BP (!) 148/52   Pulse 77   Ht 5\' 9"  (1.753 m)   Wt (!) 330 lb (149.7 kg)   SpO2 97%   BMI 48.73 kg/m , BMI Body mass index is 48.73 kg/m.  Wt Readings from Last 3 Encounters:  12/12/18 (!) 330 lb (149.7  kg)  09/02/18 (!) 318 lb 12.8 oz (  144.6 kg)  08/28/18 (!) 320 lb (145.2 kg)    General: Patient appears comfortable at rest.  Uses a cane. HEENT: Conjunctiva and lids normal, wearing a mask. Neck: Supple, no elevated JVP or carotid bruits, no thyromegaly. Lungs: Decreased breath sounds without wheezing, nonlabored breathing at rest. Cardiac: Regular rate and rhythm, no S3 or significant systolic murmur. Abdomen: Protuberant, bowel sounds present. Extremities: Venous stasis as before, distal pulses 2+. Skin: Warm and dry. Musculoskeletal: No kyphosis. Neuropsychiatric: Alert and oriented x3, affect grossly appropriate.  ECG:  An ECG dated 08/16/2018 was personally reviewed today and demonstrated:  Sinus rhythm with prolonged PR interval and IVCD.  Recent Labwork: 08/16/2018: B Natriuretic Peptide 25.0; BUN 21; Creatinine, Ser 0.65; Platelets 380 08/28/2018: Hemoglobin 10.5; Potassium 3.6; Sodium 140   Other Studies Reviewed Today:  Cardiac catheterization 08/28/2018:  The left ventricular systolic function is normal.  The left ventricular ejection fraction is 55-65% by visual estimate.   1. No angiographic evidence of CAD 2. Mildly elevated left ventricular filling pressures 3. Dyspnea likely multifactorial due to COPD, obesity and physical deconditioning with chronic diastolic CHF  No further ischemic workup.   Echocardiogram 06/17/2018 [facility]): Overall limited study based on report.  LVEF described as normal range with calculation provided at 54.8%.  Valves not well visualized.  Assessment and Plan:  1.  Paroxysmal atrial fibrillation.  She is symptomatically stable.  We will continue Cardizem CD, flecainide, and Xarelto.  CBC and BMET for next office visit.  2.  Chronic shortness of breath, multifactorial in the setting of morbid obesity, diastolic dysfunction, COPD, deconditioning with chronic pain, and likely microvascular dysfunction.  3.  Normal  epicardial coronary arteries by cardiac catheterization.  She has microvascular dysfunction and also history of esophageal spasm.  Continue medical therapy.  Medication Adjustments/Labs and Tests Ordered: Current medicines are reviewed at length with the patient today.  Concerns regarding medicines are outlined above.   Tests Ordered: No orders of the defined types were placed in this encounter.   Medication Changes: No orders of the defined types were placed in this encounter.   Disposition:  Follow up 6 months in the [place] office.  Signed, Satira Sark, MD, [facility] 12/12/2018 2:31 PM    [facility] [place], [place], [place] [address] Phone: [phone]; Fax: [phone]

## 2018-12-12 NOTE — Patient Instructions (Addendum)
Medication Instructions:    Your physician recommends that you continue on your current medications as directed. Please refer to the Current Medication list given to you today.  Labwork:  Your physician recommends that you return for lab work in: 6 months just before your next visit to check your BMET and CBC. We will notify you when this is due.  Testing/Procedures:  NONE  Follow-Up:  Your physician recommends that you schedule a follow-up appointment in: 6 months (office). You will receive a reminder letter in the mail in about 4 months reminding you to call and schedule your appointment. If you don't receive this letter, please contact our office.  Any Other Special Instructions Will Be Listed Below (If Applicable).  If you need a refill on your cardiac medications before your next appointment, please call your pharmacy.

## 2018-12-17 ENCOUNTER — Other Ambulatory Visit: Payer: Self-pay | Admitting: Gastroenterology

## 2018-12-17 DIAGNOSIS — K219 Gastro-esophageal reflux disease without esophagitis: Secondary | ICD-10-CM

## 2018-12-17 DIAGNOSIS — K582 Mixed irritable bowel syndrome: Secondary | ICD-10-CM

## 2018-12-17 DIAGNOSIS — R110 Nausea: Secondary | ICD-10-CM

## 2019-01-07 ENCOUNTER — Telehealth: Payer: Self-pay | Admitting: Internal Medicine

## 2019-01-07 MED ORDER — DEXLANSOPRAZOLE 60 MG PO CPDR: 60.0000 mg | DELAYED_RELEASE_CAPSULE | Freq: Every day | ORAL | 3 refills | Status: DC

## 2019-01-07 NOTE — Telephone Encounter (Signed)
[phone] PATIENT CALLED AND SAID HER NEW INSURANCE WILL NOT COVER HER NEXIUM BUT IT WILL COVER [place].  PATIENT WILL NEED TO SWITCH MEDS

## 2019-01-07 NOTE — Telephone Encounter (Signed)
Noted. Pt notified that RX was sent to her pharmacy

## 2019-01-07 NOTE — Addendum Note (Signed)
Addended by: Annitta Needs on: 01/07/2019 12:05 PM   Modules accepted: Orders

## 2019-01-07 NOTE — Telephone Encounter (Signed)
Routing message to AB. AB please consider Dexilant 60 mg that is covered under pts insurance. Nexium is no longer covered per pt.

## 2019-01-07 NOTE — Telephone Encounter (Signed)
Dexilant sent to pharmacy.

## 2019-01-08 ENCOUNTER — Other Ambulatory Visit: Payer: Self-pay | Admitting: Gastroenterology

## 2019-01-08 DIAGNOSIS — K5900 Constipation, unspecified: Secondary | ICD-10-CM

## 2019-01-23 DIAGNOSIS — E119 Type 2 diabetes mellitus without complications: Secondary | ICD-10-CM | POA: Diagnosis not present

## 2019-01-23 DIAGNOSIS — I10 Essential (primary) hypertension: Secondary | ICD-10-CM | POA: Diagnosis not present

## 2019-01-23 DIAGNOSIS — I2510 Atherosclerotic heart disease of native coronary artery without angina pectoris: Secondary | ICD-10-CM | POA: Diagnosis not present

## 2019-01-28 DIAGNOSIS — B351 Tinea unguium: Secondary | ICD-10-CM | POA: Diagnosis not present

## 2019-01-28 DIAGNOSIS — E1142 Type 2 diabetes mellitus with diabetic polyneuropathy: Secondary | ICD-10-CM | POA: Diagnosis not present

## 2019-01-28 DIAGNOSIS — M79676 Pain in unspecified toe(s): Secondary | ICD-10-CM | POA: Diagnosis not present

## 2019-01-28 DIAGNOSIS — L84 Corns and callosities: Secondary | ICD-10-CM | POA: Diagnosis not present

## 2019-01-29 DIAGNOSIS — J309 Allergic rhinitis, unspecified: Secondary | ICD-10-CM | POA: Diagnosis not present

## 2019-01-29 DIAGNOSIS — Z9181 History of falling: Secondary | ICD-10-CM | POA: Diagnosis not present

## 2019-01-29 DIAGNOSIS — G4733 Obstructive sleep apnea (adult) (pediatric): Secondary | ICD-10-CM | POA: Diagnosis not present

## 2019-01-29 DIAGNOSIS — J410 Simple chronic bronchitis: Secondary | ICD-10-CM | POA: Diagnosis not present

## 2019-02-06 DIAGNOSIS — J449 Chronic obstructive pulmonary disease, unspecified: Secondary | ICD-10-CM | POA: Diagnosis not present

## 2019-02-06 DIAGNOSIS — E1165 Type 2 diabetes mellitus with hyperglycemia: Secondary | ICD-10-CM | POA: Diagnosis not present

## 2019-02-06 DIAGNOSIS — E11621 Type 2 diabetes mellitus with foot ulcer: Secondary | ICD-10-CM | POA: Diagnosis not present

## 2019-02-06 DIAGNOSIS — Z299 Encounter for prophylactic measures, unspecified: Secondary | ICD-10-CM | POA: Diagnosis not present

## 2019-02-06 DIAGNOSIS — Z6841 Body Mass Index (BMI) 40.0 and over, adult: Secondary | ICD-10-CM | POA: Diagnosis not present

## 2019-02-06 DIAGNOSIS — L97509 Non-pressure chronic ulcer of other part of unspecified foot with unspecified severity: Secondary | ICD-10-CM | POA: Diagnosis not present

## 2019-02-06 DIAGNOSIS — I10 Essential (primary) hypertension: Secondary | ICD-10-CM | POA: Diagnosis not present

## 2019-02-21 DIAGNOSIS — R0602 Shortness of breath: Secondary | ICD-10-CM | POA: Diagnosis not present

## 2019-02-21 DIAGNOSIS — Z1159 Encounter for screening for other viral diseases: Secondary | ICD-10-CM | POA: Diagnosis not present

## 2019-02-26 DIAGNOSIS — G4733 Obstructive sleep apnea (adult) (pediatric): Secondary | ICD-10-CM | POA: Diagnosis not present

## 2019-02-26 DIAGNOSIS — I480 Paroxysmal atrial fibrillation: Secondary | ICD-10-CM | POA: Diagnosis not present

## 2019-02-26 DIAGNOSIS — R0602 Shortness of breath: Secondary | ICD-10-CM | POA: Diagnosis not present

## 2019-02-26 DIAGNOSIS — J410 Simple chronic bronchitis: Secondary | ICD-10-CM | POA: Diagnosis not present

## 2019-02-26 DIAGNOSIS — I5032 Chronic diastolic (congestive) heart failure: Secondary | ICD-10-CM | POA: Diagnosis not present

## 2019-02-26 LAB — PULMONARY FUNCTION TEST

## 2019-03-03 NOTE — Progress Notes (Signed)
Referring Provider: Glenda Chroman, MD Primary Care Physician:  Glenda Chroman, MD  Primary GI: Dr. Gala Romney   Chief Complaint  Patient presents with  . Gastroesophageal Reflux    F/U. occas flare.  . Diarrhea    HPI:   [name] [name] is a 59 y.o. female presenting today with a history of OIC, GERD, gastroparesis.  Constipation: Movantik without help in the past. Amitiza worsened constipation per patient. Now regimen includes Motegrity, Linzess 290 mcg. Taking 1 tbsp Lactulose. 1 capful Miralax, sometimes 2 scoops. Two stool softeners a day. This regimen has helped with constipation. Colonoscopy due again in 2023.   Not taking lactulose as much any longer. Now only takes prn. Changing to pain patches hash elped with constipation. Was having violent diarrhea with lactulose. Will still have diarrhea but not as violent. Several times in the morning. When missing stool softeners, didn't go as frequently. Abdominal discomfort with diarrhea.   GERD: Nexium daily and Pepcid prn historically. Received message in Jan 2021 from patient that Nexium was no longer covered by insurance. Dexilant was sent into pharmacy. Says that she needs a PA for Dexilant. Dexilant helps with GERD symptoms but will take Pepcid as needed. No nocturnal GERD. Sleeps with head elevated.   No overt GI bleeding. No dysphagia. Good appetite.    Past Medical History:  Diagnosis Date  . Bladder dysfunction    Self urinary catheterization  . COPD (chronic obstructive pulmonary disease) ([place])   . DDD (degenerative disc disease)   . Depression   . Esophageal spasm    NTG and Norvasc  . Essential hypertension   . Fibromyalgia   . Gastroparesis   . GERD (gastroesophageal reflux disease)   . History of cardiac catheterization    Normal coronary arteries  . History of pituitary tumor   . Irritable bowel syndrome   . Lymphedema   . Osteoarthritis   . PAF (paroxysmal atrial fibrillation) ([place])   . Sleep apnea      BIPAP  . Type 2 diabetes mellitus ([place])   . Urinary tract infection     Past Surgical History:  Procedure Laterality Date  . ABDOMINAL HYSTERECTOMY    . ANTERIOR CERVICAL DECOMP/DISCECTOMY FUSION N/A 12/02/2014   Procedure: Cervical five cervical six anterior cervical decompression with fusion interbody prosthesis plating and bone graft;  Surgeon: Newman Pies, MD;  Location: [facility];  Service: Neurosurgery;  Laterality: N/A;  C56 anterior cervical decompression with fusion interbody prosthesis plating and bonegraft  . APPENDECTOMY    . BACK SURGERY    . CARDIAC CATHETERIZATION    . CARDIOVERSION N/A 03/26/2014   Procedure: CARDIOVERSION;  Surgeon: Herminio Commons, MD;  Location: [facility];  Service: Endoscopy;  Laterality: N/A;  . CARDIOVERSION N/A 05/04/2014   Procedure: CARDIOVERSION;  Surgeon: Satira Sark, MD;  Location: [facility];  Service: Cardiovascular;  Laterality: N/A;  . CHOLECYSTECTOMY    . COLONOSCOPY     Approximately 2003. Per medical records, internal hemorrhoids noted  . COLONOSCOPY N/A 06/25/2013   Dr. Gala Romney: Anal canal hemorrhoids-more likely the source of paper hematochezia. Redundant, capacious colon. Multiple colonic polyps-tubular adenoma  . COLONOSCOPY WITH PROPOFOL N/A 12/04/2016   Dr. Gala Romney: redundant colon, colonic diverticulosis. Screening due in 2023  . ENDOVENOUS ABLATION SAPHENOUS VEIN W/ LASER Left 03/07/2018   endovenous ablation left greater saphenous vein by Deitra Mayo MD   . ESOPHAGOGASTRODUODENOSCOPY  10/12/2004   [id number] plaquing on the  esophageal mucosa of uncertain significance, not typical of what is seen with candida esophagitis status post KOH brushing for KOH prep and biopsy for histology. Rule out candida esophagitis/eosinophilic esophagitis. Otherwise normal esophagus. Tiny hiatal hernia. Otherwise, normal stomach, normal D1 and D2. Benign biopsy of esophagus, unknown KOH status.   . ESOPHAGOGASTRODUODENOSCOPY N/A 06/25/2013    Dr. Rourk:mild chronic gastritis  . KNEE SURGERY    . LEFT HEART CATHETERIZATION WITH CORONARY ANGIOGRAM N/A 11/15/2010   Procedure: LEFT HEART CATHETERIZATION WITH CORONARY ANGIOGRAM;  Surgeon: Laverda Page, MD;  Location: [facility] C[facility];  Service: Cardiovascular;  Laterality: N/A;  . LUNG BIOPSY    . RIGHT/LEFT HEART CATH AND CORONARY ANGIOGRAPHY N/A 08/28/2018   Procedure: RIGHT/LEFT HEART CATH AND CORONARY ANGIOGRAPHY;  Surgeon: Burnell Blanks, MD;  Location: [facility];  Service: Cardiovascular;  Laterality: N/A;    Current Outpatient Medications  Medication Sig Dispense Refill  . albuterol (PROAIR HFA) 108 (90 BASE) MCG/ACT inhaler Inhale 2 puffs into the lungs every 6 (six) hours as needed for wheezing or shortness of breath.     Marland Kitchen albuterol (PROVENTIL) (2.5 MG/3ML) 0.083% nebulizer solution Take 2.5 mg by nebulization every 6 (six) hours as needed for wheezing. For shortness of breath.    Marland Kitchen atorvastatin (LIPITOR) 10 MG tablet Take 10 mg by mouth at bedtime.  3  . baclofen (LIORESAL) 20 MG tablet Take 20 mg by mouth 3 (three) times daily.      Marland Kitchen buPROPion (WELLBUTRIN) 75 MG tablet Take 1 tablet (75 mg total) by mouth daily. 30 tablet 3  . CONSTULOSE 10 GM/15ML solution TAKE ONE TABLESPOONFUL (15ML) BY MOUTH DAILY 473 mL 5  . dexlansoprazole (DEXILANT) 60 MG capsule Take 1 capsule (60 mg total) by mouth daily. 90 capsule 3  . diclofenac sodium (VOLTAREN) 1 % GEL Apply 2-4 g topically 4 (four) times daily as needed (for pain).     Marland Kitchen diltiazem (CARDIZEM CD) 120 MG 24 hr capsule Take 1 capsule (120 mg total) by mouth daily. 90 capsule 3  . docusate sodium (COLACE) 100 MG capsule Take 100 mg by mouth 2 (two) times daily.    . famotidine (PEPCID) 20 MG tablet TAKE 1 TABLET BY MOUTH EVERY DAY AS NEEDED FOR HEARTBURN OR INDIGESTION 30 tablet 3  . fentaNYL (DURAGESIC) 12 MCG/HR Place 1 patch onto the skin every other day.    . flecainide (TAMBOCOR) 50 MG tablet TAKE TWO  TABLETS BY MOUTH EVERY MORNING and TAKE TWO TABLETS BY MOUTH EVERY EVENING 120 tablet 6  . fluticasone (FLONASE) 50 MCG/ACT nasal spray Place 2 sprays into both nostrils daily.  6  . fluticasone furoate-vilanterol (BREO ELLIPTA) 200-25 MCG/INH AEPB Inhale 1 puff into the lungs daily.    . furosemide (LASIX) 80 MG tablet Take 80 mg by mouth 2 (two) times daily.      Marland Kitchen gabapentin (NEURONTIN) 800 MG tablet Take 800 mg by mouth 4 (four) times daily.   2  . glimepiride (AMARYL) 4 MG tablet Take 4 mg by mouth 2 (two) times daily.    . hydrocortisone (ANUSOL-HC) 2.5 % rectal cream Place 1 application rectally 2 (two) times daily. (Patient taking differently: Place 1 application rectally 2 (two) times daily as needed for hemorrhoids. ) 30 g 1  . isosorbide mononitrate (IMDUR) 120 MG 24 hr tablet Take 120 mg by mouth daily. In the morning    . isosorbide mononitrate (IMDUR) 30 MG 24 hr tablet Take 30 mg by  mouth every evening.     Marland Kitchen LINZESS 290 MCG CAPS capsule TAKE ONE CAPSULE BY MOUTH EVERY DAY BEFORE BREAKFAST - REPLACES MOVANTIK 30 capsule 5  . lisinopril (PRINIVIL,ZESTRIL) 10 MG tablet Take 10 mg by mouth every evening.     . loratadine (CLARITIN) 10 MG tablet Take 10 mg by mouth daily.      . meclizine (ANTIVERT) 25 MG tablet Take 25 mg by mouth 3 (three) times daily as needed for dizziness.     . metFORMIN (GLUCOPHAGE) 500 MG tablet Take 1,000 mg by mouth 2 (two) times daily.     . metolazone (ZAROXOLYN) 2.5 MG tablet Take 2.5 mg by mouth daily with breakfast.     . MOTEGRITY 2 MG TABS TAKE 1 TABLET BY MOUTH DAILY 30 tablet 5  . Multiple Vitamin (MULTIVITAMIN WITH MINERALS) TABS tablet Take 1 tablet by mouth daily.    . nitroGLYCERIN (NITROLINGUAL) 0.4 MG/SPRAY spray USE 1 SPRAY UNDER THE TONGUE AS NEEDED FOR CHEST PAIN - PA SENT 11/28/2016- JB (Patient taking differently: Place 1 spray under the tongue every 5 (five) minutes x 3 doses as needed for chest pain. ) 12 g 3  . nystatin  (MYCOSTATIN/NYSTOP) 100000 UNIT/GM POWD Apply 1 g topically daily. After bathing    . ondansetron (ZOFRAN) 4 MG tablet TAKE 1 TABLET BY MOUTH EVERY 8 HOURS AS NEEDED FOR NAUSEA AND VOMITING 30 tablet 3  . oxyCODONE-acetaminophen (PERCOCET) 10-325 MG per tablet Take 1 tablet by mouth every 6 (six) hours as needed for pain.     . polyethylene glycol powder (GLYCOLAX/MIRALAX) powder Take 17-34 g by mouth daily.     . potassium chloride SA (K-DUR,KLOR-CON) 20 MEQ tablet Take 40 mEq by mouth 5 (five) times daily.     . Rivaroxaban (XARELTO) 20 MG TABS Take 20 mg by mouth daily with supper.     . Semaglutide,0.25 or 0.5MG /DOS, (OZEMPIC, 0.25 OR 0.5 MG/DOSE,) 2 MG/1.5ML SOPN Inject 0.25 mg into the skin every Tuesday. At lunch    . spironolactone (ALDACTONE) 50 MG tablet Take 50 mg by mouth 3 (three) times daily.      . TRESIBA FLEXTOUCH 200 UNIT/ML SOPN Inject 46 Units into the skin at bedtime.    Marland Kitchen VASCEPA 1 g CAPS Take 1 g by mouth 4 (four) times daily.    Marland Kitchen NEXIUM 40 MG capsule TAKE ONE CAPSULE BY MOUTH EVERY DAY BEFORE BREAKFAST (seven IN THE MORNING PER pt) (Patient not taking: 1 hour before breakfast) 90 capsule 3  . NUCYNTA ER 200 MG TB12 Take 200 mg by mouth every 12 (twelve) hours.      Current Facility-Administered Medications  Medication Dose Route Frequency Provider Last Rate Last Admin  . sodium chloride flush (NS) 0.9 % injection 3 mL  3 mL Intravenous Q12H Satira Sark, MD        Allergies as of 03/04/2019 - Review Complete 03/04/2019  Allergen Reaction Noted  . Levofloxacin Other (See Comments) 08/17/2011  . Tape Other (See Comments) 11/21/2016  . Hyoscyamine sulfate Other (See Comments)   . Metoclopramide Other (See Comments) 08/17/2011    Family History  Problem Relation Age of Onset  . Coronary artery disease Father        Died age 61  . Heart attack Father   . Arrhythmia Father        AF  . Diabetes Father   . Parkinson's disease Father   . Arrhythmia Mother  AF  . Stroke Mother   . Dementia Mother   . Cancer Mother        UTERINE   . Parkinson's disease Mother   . Arrhythmia Brother        AF  . Colon cancer Maternal Grandfather   . Colon cancer Paternal Aunt   . Colon cancer Paternal Aunt   . Diabetes Son   . Cancer Sister        BREAST   . Depression Sister   . Depression Sister   . Depression Sister     Social History   Socioeconomic History  . Marital status: Divorced    Spouse name: Not on file  . Number of children: Not on file  . Years of education: Not on file  . Highest education level: Not on file  Occupational History  . Not on file  Tobacco Use  . Smoking status: Former Smoker    Packs/day: 1.50    Years: 30.00    Pack years: 45.00    Types: Cigarettes    Start date: 03/06/1977    Quit date: 01/03/2007    Years since quitting: 12.1  . Smokeless tobacco: Never Used  Substance and Sexual Activity  . Alcohol use: No    Alcohol/week: 0.0 standard drinks  . Drug use: No  . Sexual activity: Not on file  Other Topics Concern  . Not on file  Social History Narrative  . Not on file   Social Determinants of Health   Financial Resource Strain:   . Difficulty of Paying Living Expenses: Not on file  Food Insecurity:   . Worried About Charity fundraiser in the Last Year: Not on file  . Ran Out of Food in the Last Year: Not on file  Transportation Needs:   . Lack of Transportation (Medical): Not on file  . Lack of Transportation (Non-Medical): Not on file  Physical Activity:   . Days of Exercise per Week: Not on file  . Minutes of Exercise per Session: Not on file  Stress:   . Feeling of Stress : Not on file  Social Connections:   . Frequency of Communication with Friends and Family: Not on file  . Frequency of Social Gatherings with Friends and Family: Not on file  . Attends Religious Services: Not on file  . Active Member of Clubs or Organizations: Not on file  . Attends Archivist Meetings:  Not on file  . Marital Status: Not on file    Review of Systems: Gen: Denies fever, chills, anorexia. Denies fatigue, weakness, weight loss.  CV: Denies chest pain, palpitations, syncope, peripheral edema, and claudication. Resp: Denies dyspnea at rest, cough, wheezing, coughing up blood, and pleurisy. GI: see HPI Derm: Denies rash, itching, dry skin Psych: Denies depression, anxiety, memory loss, confusion. No homicidal or suicidal ideation.  Heme: Denies bruising, bleeding, and enlarged lymph nodes.  Physical Exam: BP 132/61   Pulse 80   Temp (!) 97.1 F (36.2 C) (Oral)   Ht 5\' 8"  (1.727 m)   Wt (!) 334 lb 12.8 oz (151.9 kg)   BMI 50.91 kg/m  General:   Alert and oriented. No distress noted. Pleasant and cooperative.  Head:  Normocephalic and atraumatic. Eyes:  Conjuctiva clear without scleral icterus. Abdomen:  +BS, soft, obese, non-tender and non-distended. No rebound or guarding. Patient sitting in chair Msk:  Symmetrical without gross deformities. Normal posture. Extremities:  With 1-2+ lower extremity edema Neurologic:  Alert and  oriented x4 Psych:  Alert and cooperative. Normal mood and affect.  ASSESSMENT: [name] [name] is a 59 y.o. female presenting today with history of opioid-induced constipation, GERD, and gastroparesis, here for routine follow-up.  OIC: continues with Motegrity, Linzess 290 mcg, Miralax 1 capful, and stool softeners. Noting diarrhea but if changes regimen (stool softener and laxative) tends to be more constipated. Will trial lower dosage of Linzess for now.  GERD: continue Dexilant daily. Pepcid prn.   Gastroparesis: without N/V. Continue PPI.   PLAN:   Continue Dexilant daily. Will check if PA is needed.   Decrease Linzess to 145 mcg daily. Samples provided. Patient to call with update  Return in 6 months   Annitta Needs, PhD, [facility] Gastroenterology

## 2019-03-04 ENCOUNTER — Ambulatory Visit (INDEPENDENT_AMBULATORY_CARE_PROVIDER_SITE_OTHER): Payer: Medicare Other | Admitting: Gastroenterology

## 2019-03-04 ENCOUNTER — Other Ambulatory Visit: Payer: Self-pay

## 2019-03-04 ENCOUNTER — Encounter: Payer: Self-pay | Admitting: Gastroenterology

## 2019-03-04 VITALS — BP 132/61 | HR 80 | Temp 97.1°F | Ht 68.0 in | Wt 334.8 lb

## 2019-03-04 DIAGNOSIS — K219 Gastro-esophageal reflux disease without esophagitis: Secondary | ICD-10-CM

## 2019-03-04 DIAGNOSIS — K5903 Drug induced constipation: Secondary | ICD-10-CM | POA: Diagnosis not present

## 2019-03-04 DIAGNOSIS — T402X5A Adverse effect of other opioids, initial encounter: Secondary | ICD-10-CM

## 2019-03-04 DIAGNOSIS — L97511 Non-pressure chronic ulcer of other part of right foot limited to breakdown of skin: Secondary | ICD-10-CM | POA: Diagnosis not present

## 2019-03-04 NOTE — Patient Instructions (Signed)
We will check on Dexilant.  Let's decrease Linzess to 145 micrograms daily. I have provided samples. If this works, let me know.  We will see you in 6 months or sooner if needed!  I enjoyed seeing you again today! As you know, I value our relationship and want to provide genuine, compassionate, and quality care. I welcome your feedback. If you receive a survey regarding your visit,  I greatly appreciate you taking time to fill this out. See you next time!  Annitta Needs, PhD, ANP-BC [facility] Gastroenterology

## 2019-03-07 ENCOUNTER — Telehealth: Payer: Self-pay | Admitting: Internal Medicine

## 2019-03-07 NOTE — Telephone Encounter (Signed)
Pt called to let the nurse know that the PA was for [place] and not Dexilant.

## 2019-03-11 DIAGNOSIS — E119 Type 2 diabetes mellitus without complications: Secondary | ICD-10-CM | POA: Diagnosis not present

## 2019-03-11 DIAGNOSIS — I10 Essential (primary) hypertension: Secondary | ICD-10-CM | POA: Diagnosis not present

## 2019-03-11 DIAGNOSIS — I2510 Atherosclerotic heart disease of native coronary artery without angina pectoris: Secondary | ICD-10-CM | POA: Diagnosis not present

## 2019-03-13 NOTE — Telephone Encounter (Signed)
Started the PA with [place] via phone. When PA was almost done, I was told that pt had medicare that she needed to go through. I called [employer] and was told pt didn't complete her registration to renew her insurance at the end of 12/2018.   Spoke with pt and she has new insurance, [employer] ID [id number], rxgp [id number], pcn partd, rxbin [id number]. [phone].

## 2019-03-17 NOTE — Telephone Encounter (Signed)
PA was sent to pts plan, waiting on an approval or denial.

## 2019-03-18 NOTE — Telephone Encounter (Signed)
PA for Motegrity was approved through [email or web address]. pt is aware of approval.

## 2019-03-19 DIAGNOSIS — Z23 Encounter for immunization: Secondary | ICD-10-CM | POA: Diagnosis not present

## 2019-04-02 DIAGNOSIS — I10 Essential (primary) hypertension: Secondary | ICD-10-CM | POA: Diagnosis not present

## 2019-04-02 DIAGNOSIS — E1142 Type 2 diabetes mellitus with diabetic polyneuropathy: Secondary | ICD-10-CM | POA: Diagnosis not present

## 2019-04-02 DIAGNOSIS — Z299 Encounter for prophylactic measures, unspecified: Secondary | ICD-10-CM | POA: Diagnosis not present

## 2019-04-02 DIAGNOSIS — S80812A Abrasion, left lower leg, initial encounter: Secondary | ICD-10-CM | POA: Diagnosis not present

## 2019-04-02 DIAGNOSIS — R5383 Other fatigue: Secondary | ICD-10-CM | POA: Diagnosis not present

## 2019-04-02 DIAGNOSIS — E1165 Type 2 diabetes mellitus with hyperglycemia: Secondary | ICD-10-CM | POA: Diagnosis not present

## 2019-04-08 DIAGNOSIS — L97511 Non-pressure chronic ulcer of other part of right foot limited to breakdown of skin: Secondary | ICD-10-CM | POA: Diagnosis not present

## 2019-04-11 DIAGNOSIS — M170 Bilateral primary osteoarthritis of knee: Secondary | ICD-10-CM | POA: Diagnosis not present

## 2019-04-16 DIAGNOSIS — Z23 Encounter for immunization: Secondary | ICD-10-CM | POA: Diagnosis not present

## 2019-04-18 DIAGNOSIS — M170 Bilateral primary osteoarthritis of knee: Secondary | ICD-10-CM | POA: Diagnosis not present

## 2019-04-29 DIAGNOSIS — I2510 Atherosclerotic heart disease of native coronary artery without angina pectoris: Secondary | ICD-10-CM | POA: Diagnosis not present

## 2019-04-29 DIAGNOSIS — I10 Essential (primary) hypertension: Secondary | ICD-10-CM | POA: Diagnosis not present

## 2019-04-29 DIAGNOSIS — E119 Type 2 diabetes mellitus without complications: Secondary | ICD-10-CM | POA: Diagnosis not present

## 2019-05-01 ENCOUNTER — Other Ambulatory Visit: Payer: Self-pay | Admitting: Nurse Practitioner

## 2019-05-01 NOTE — Telephone Encounter (Signed)
Please verify if patient needs Linzess 290 or 145 mcg refilled. Samples of 145 mcg provided at last OV in March 2021.

## 2019-05-02 DIAGNOSIS — M170 Bilateral primary osteoarthritis of knee: Secondary | ICD-10-CM | POA: Diagnosis not present

## 2019-05-02 NOTE — Telephone Encounter (Signed)
Spoke with pt. The Linzess 145 mcg samples didn't work well. Pt is currently taking Linzess 290 mcg and would like a refill.

## 2019-05-02 NOTE — Telephone Encounter (Signed)
Rx sent 

## 2019-05-13 DIAGNOSIS — G894 Chronic pain syndrome: Secondary | ICD-10-CM | POA: Diagnosis not present

## 2019-05-13 DIAGNOSIS — E1142 Type 2 diabetes mellitus with diabetic polyneuropathy: Secondary | ICD-10-CM | POA: Diagnosis not present

## 2019-05-13 DIAGNOSIS — M48061 Spinal stenosis, lumbar region without neurogenic claudication: Secondary | ICD-10-CM | POA: Diagnosis not present

## 2019-05-13 DIAGNOSIS — Z79891 Long term (current) use of opiate analgesic: Secondary | ICD-10-CM | POA: Diagnosis not present

## 2019-05-15 DIAGNOSIS — B351 Tinea unguium: Secondary | ICD-10-CM | POA: Diagnosis not present

## 2019-05-15 DIAGNOSIS — M79676 Pain in unspecified toe(s): Secondary | ICD-10-CM | POA: Diagnosis not present

## 2019-05-15 DIAGNOSIS — E1142 Type 2 diabetes mellitus with diabetic polyneuropathy: Secondary | ICD-10-CM | POA: Diagnosis not present

## 2019-05-15 DIAGNOSIS — L84 Corns and callosities: Secondary | ICD-10-CM | POA: Diagnosis not present

## 2019-06-01 ENCOUNTER — Other Ambulatory Visit: Payer: Self-pay | Admitting: Cardiology

## 2019-06-05 DIAGNOSIS — R21 Rash and other nonspecific skin eruption: Secondary | ICD-10-CM | POA: Diagnosis not present

## 2019-06-05 DIAGNOSIS — I10 Essential (primary) hypertension: Secondary | ICD-10-CM | POA: Diagnosis not present

## 2019-06-05 DIAGNOSIS — E1165 Type 2 diabetes mellitus with hyperglycemia: Secondary | ICD-10-CM | POA: Diagnosis not present

## 2019-06-05 DIAGNOSIS — J449 Chronic obstructive pulmonary disease, unspecified: Secondary | ICD-10-CM | POA: Diagnosis not present

## 2019-06-05 DIAGNOSIS — Z299 Encounter for prophylactic measures, unspecified: Secondary | ICD-10-CM | POA: Diagnosis not present

## 2019-06-05 DIAGNOSIS — W5503XA Scratched by cat, initial encounter: Secondary | ICD-10-CM | POA: Diagnosis not present

## 2019-06-10 ENCOUNTER — Telehealth: Payer: Self-pay | Admitting: *Deleted

## 2019-06-10 NOTE — Telephone Encounter (Signed)
-----   Message from Merlene Laughter, RN sent at 12/12/2018  1:56 PM EST ----- Regarding: FOLLOW UP/BMET & CBC/ B4 VISIT IN 6 [facility] WILL NEED TO BE NOTIFIED AND ORDERS PLACED WHEN THIS COMES OV 06/26/2019

## 2019-06-12 DIAGNOSIS — G894 Chronic pain syndrome: Secondary | ICD-10-CM | POA: Diagnosis not present

## 2019-06-12 DIAGNOSIS — E1142 Type 2 diabetes mellitus with diabetic polyneuropathy: Secondary | ICD-10-CM | POA: Diagnosis not present

## 2019-06-12 DIAGNOSIS — M48061 Spinal stenosis, lumbar region without neurogenic claudication: Secondary | ICD-10-CM | POA: Diagnosis not present

## 2019-06-12 DIAGNOSIS — Z79891 Long term (current) use of opiate analgesic: Secondary | ICD-10-CM | POA: Diagnosis not present

## 2019-06-12 NOTE — Telephone Encounter (Signed)
Contacted about requested BMET & CBC before visit on 06/26/19. Reports she will be having her physical and fasting lab work with her PCP on 06/20/2019. Advised that we will request results after completion. Verbalized understanding.

## 2019-06-19 DIAGNOSIS — L97511 Non-pressure chronic ulcer of other part of right foot limited to breakdown of skin: Secondary | ICD-10-CM | POA: Diagnosis not present

## 2019-06-23 ENCOUNTER — Other Ambulatory Visit: Payer: Self-pay | Admitting: *Deleted

## 2019-06-23 DIAGNOSIS — I480 Paroxysmal atrial fibrillation: Secondary | ICD-10-CM

## 2019-06-24 DIAGNOSIS — I480 Paroxysmal atrial fibrillation: Secondary | ICD-10-CM | POA: Diagnosis not present

## 2019-06-26 ENCOUNTER — Ambulatory Visit (INDEPENDENT_AMBULATORY_CARE_PROVIDER_SITE_OTHER): Payer: Medicare Other | Admitting: Cardiology

## 2019-06-26 ENCOUNTER — Other Ambulatory Visit: Payer: Self-pay

## 2019-06-26 ENCOUNTER — Encounter: Payer: Self-pay | Admitting: Cardiology

## 2019-06-26 VITALS — BP 128/64 | HR 80 | Ht 68.0 in | Wt 349.0 lb

## 2019-06-26 DIAGNOSIS — I890 Lymphedema, not elsewhere classified: Secondary | ICD-10-CM | POA: Diagnosis not present

## 2019-06-26 DIAGNOSIS — I480 Paroxysmal atrial fibrillation: Secondary | ICD-10-CM | POA: Diagnosis not present

## 2019-06-26 DIAGNOSIS — Z79899 Other long term (current) drug therapy: Secondary | ICD-10-CM | POA: Diagnosis not present

## 2019-06-26 NOTE — Progress Notes (Signed)
Cardiology Office Note  Date: 06/26/2019   ID: [name], DOB Nov 20, 1960, MRN [id number]  PCP:  Glenda Chroman, MD  Cardiologist:  Rozann Lesches, MD Electrophysiologist:  None   Chief Complaint  Patient presents with  . Cardiac follow-up    History of Present Illness: [name] [name] is a 59 y.o. female last seen in December 2020.  She presents for a routine visit.  States that she has had more trouble with weight gain and also further leg tightness despite using compression hose and her diuretics which are at high dose.  I did talk with her today about referral for PT to consider mechanical compression as most of this looks to be related to lymphedema at this point.  No definite sense of palpitations, but she continues to be short of breath as before with intermittent chest discomfort.  As discussed below, her cardiac catheterization from last year was reassuring and did not show any significant CAD.  I reviewed her recent lab work as outlined below.  I also reviewed her ECG which shows sinus rhythm with prolonged PR interval, and leftward axis.  Past Medical History:  Diagnosis Date  . Bladder dysfunction    Self urinary catheterization  . COPD (chronic obstructive pulmonary disease) ([place])   . DDD (degenerative disc disease)   . Depression   . Esophageal spasm    NTG and Norvasc  . Essential hypertension   . Fibromyalgia   . Gastroparesis   . GERD (gastroesophageal reflux disease)   . History of cardiac catheterization    Normal coronary arteries  . History of pituitary tumor   . Irritable bowel syndrome   . Lymphedema   . Osteoarthritis   . PAF (paroxysmal atrial fibrillation) ([place])   . Sleep apnea    BIPAP  . Type 2 diabetes mellitus ([place])   . Urinary tract infection     Past Surgical History:  Procedure Laterality Date  . ABDOMINAL HYSTERECTOMY    . ANTERIOR CERVICAL DECOMP/DISCECTOMY FUSION N/A 12/02/2014   Procedure: Cervical five cervical six  anterior cervical decompression with fusion interbody prosthesis plating and bone graft;  Surgeon: Newman Pies, MD;  Location: [facility];  Service: Neurosurgery;  Laterality: N/A;  C56 anterior cervical decompression with fusion interbody prosthesis plating and bonegraft  . APPENDECTOMY    . BACK SURGERY    . CARDIAC CATHETERIZATION    . CARDIOVERSION N/A 03/26/2014   Procedure: CARDIOVERSION;  Surgeon: Herminio Commons, MD;  Location: [facility];  Service: Endoscopy;  Laterality: N/A;  . CARDIOVERSION N/A 05/04/2014   Procedure: CARDIOVERSION;  Surgeon: Satira Sark, MD;  Location: [facility];  Service: Cardiovascular;  Laterality: N/A;  . CHOLECYSTECTOMY    . COLONOSCOPY     Approximately 2003. Per medical records, internal hemorrhoids noted  . COLONOSCOPY N/A 06/25/2013   Dr. Gala Romney: Anal canal hemorrhoids-more likely the source of paper hematochezia. Redundant, capacious colon. Multiple colonic polyps-tubular adenoma  . COLONOSCOPY WITH PROPOFOL N/A 12/04/2016   Dr. Gala Romney: redundant colon, colonic diverticulosis. Screening due in 2023  . ENDOVENOUS ABLATION SAPHENOUS VEIN W/ LASER Left 03/07/2018   endovenous ablation left greater saphenous vein by Deitra Mayo MD   . ESOPHAGOGASTRODUODENOSCOPY  10/12/2004   TDD:UKGU plaquing on the esophageal mucosa of uncertain significance, not typical of what is seen with candida esophagitis status post KOH brushing for KOH prep and biopsy for histology. Rule out candida esophagitis/eosinophilic esophagitis. Otherwise normal esophagus. Tiny hiatal hernia. Otherwise, normal stomach,  normal D1 and D2. Benign biopsy of esophagus, unknown KOH status.   . ESOPHAGOGASTRODUODENOSCOPY N/A 06/25/2013   Dr. Rourk:mild chronic gastritis  . KNEE SURGERY    . LEFT HEART CATHETERIZATION WITH CORONARY ANGIOGRAM N/A 11/15/2010   Procedure: LEFT HEART CATHETERIZATION WITH CORONARY ANGIOGRAM;  Surgeon: Laverda Page, MD;  [facility] C[facility];  Service:  Cardiovascular;  Laterality: N/A;  . LUNG BIOPSY    . RIGHT/LEFT HEART CATH AND CORONARY ANGIOGRAPHY N/A 08/28/2018   Procedure: RIGHT/LEFT HEART CATH AND CORONARY ANGIOGRAPHY;  Surgeon: Burnell Blanks, MD;  Location: [facility];  Service: Cardiovascular;  Laterality: N/A;    Current Outpatient Medications  Medication Sig Dispense Refill  . albuterol (PROAIR HFA) 108 (90 BASE) MCG/ACT inhaler Inhale 2 puffs into the lungs every 6 (six) hours as needed for wheezing or shortness of breath.     Marland Kitchen albuterol (PROVENTIL) (2.5 MG/3ML) 0.083% nebulizer solution Take 2.5 mg by nebulization every 6 (six) hours as needed for wheezing. For shortness of breath.    Marland Kitchen atorvastatin (LIPITOR) 10 MG tablet Take 10 mg by mouth at bedtime.  3  . baclofen (LIORESAL) 20 MG tablet Take 20 mg by mouth 3 (three) times daily.      . CONSTULOSE 10 GM/15ML solution TAKE ONE TABLESPOONFUL (15ML) BY MOUTH DAILY 473 mL 5  . dexlansoprazole (DEXILANT) 60 MG capsule Take 1 capsule (60 mg total) by mouth daily. 90 capsule 3  . diclofenac sodium (VOLTAREN) 1 % GEL Apply 2-4 g topically 4 (four) times daily as needed (for pain).     Marland Kitchen diltiazem (CARDIZEM CD) 120 MG 24 hr capsule Take 1 capsule (120 mg total) by mouth daily. 90 capsule 3  . docusate sodium (COLACE) 100 MG capsule Take 100 mg by mouth 2 (two) times daily.    . famotidine (PEPCID) 20 MG tablet TAKE 1 TABLET BY MOUTH EVERY DAY AS NEEDED FOR HEARTBURN OR INDIGESTION 30 tablet 3  . fentaNYL (DURAGESIC) 12 MCG/HR Place 1 patch onto the skin every other day.    . flecainide (TAMBOCOR) 50 MG tablet TAKE TWO TABLETS BY MOUTH EVERY MORNING and TAKE TWO TABLETS BY MOUTH EVERY EVENING 120 tablet 6  . fluticasone (FLONASE) 50 MCG/ACT nasal spray Place 2 sprays into both nostrils daily.  6  . fluticasone furoate-vilanterol (BREO ELLIPTA) 200-25 MCG/INH AEPB Inhale 1 puff into the lungs daily.    . furosemide (LASIX) 80 MG tablet Take 80 mg by mouth 2 (two) times  daily.      Marland Kitchen gabapentin (NEURONTIN) 800 MG tablet Take 800 mg by mouth 4 (four) times daily.   2  . glimepiride (AMARYL) 4 MG tablet Take 4 mg by mouth 2 (two) times daily.    . hydrocortisone (ANUSOL-HC) 2.5 % rectal cream Place 1 application rectally 2 (two) times daily. (Patient taking differently: Place 1 application rectally 2 (two) times daily as needed for hemorrhoids. ) 30 g 1  . isosorbide mononitrate (IMDUR) 120 MG 24 hr tablet Take 120 mg by mouth daily. In the morning    . isosorbide mononitrate (IMDUR) 30 MG 24 hr tablet Take 30 mg by mouth every evening.     Marland Kitchen LINZESS 290 MCG CAPS capsule TAKE ONE CAPSULE BY MOUTH EVERY DAY BEFORE BREAKFAST - REPLACES MOVANTIK 30 capsule 5  . lisinopril (PRINIVIL,ZESTRIL) 10 MG tablet Take 10 mg by mouth every evening.     . loratadine (CLARITIN) 10 MG tablet Take 10 mg by mouth daily.      Marland Kitchen  meclizine (ANTIVERT) 25 MG tablet Take 25 mg by mouth 3 (three) times daily as needed for dizziness.     . metFORMIN (GLUCOPHAGE) 500 MG tablet Take 1,000 mg by mouth 2 (two) times daily.     . metolazone (ZAROXOLYN) 2.5 MG tablet Take 2.5 mg by mouth daily with breakfast.     . MOTEGRITY 2 MG TABS TAKE 1 TABLET BY MOUTH DAILY 30 tablet 5  . Multiple Vitamin (MULTIVITAMIN WITH MINERALS) TABS tablet Take 1 tablet by mouth daily.    Marland Kitchen NEXIUM 40 MG capsule TAKE ONE CAPSULE BY MOUTH EVERY DAY BEFORE BREAKFAST (seven IN THE MORNING PER pt) 90 capsule 3  . nitroGLYCERIN (NITROLINGUAL) 0.4 MG/SPRAY spray USE 1 SPRAY UNDER THE TONGUE AS NEEDED FOR CHEST PAIN - PA SENT 11/28/2016- JB (Patient taking differently: Place 1 spray under the tongue every 5 (five) minutes x 3 doses as needed for chest pain. ) 12 g 3  . nystatin (MYCOSTATIN/NYSTOP) 100000 UNIT/GM POWD Apply 1 g topically daily. After bathing    . ondansetron (ZOFRAN) 4 MG tablet TAKE 1 TABLET BY MOUTH EVERY 8 HOURS AS NEEDED FOR NAUSEA AND VOMITING 30 tablet 3  . oxyCODONE-acetaminophen (PERCOCET) 10-325 MG per  tablet Take 1 tablet by mouth every 6 (six) hours as needed for pain.     . polyethylene glycol powder (GLYCOLAX/MIRALAX) powder Take 17-34 g by mouth daily.     . potassium chloride SA (K-DUR,KLOR-CON) 20 MEQ tablet Take 40 mEq by mouth 5 (five) times daily.     . Rivaroxaban (XARELTO) 20 MG TABS Take 20 mg by mouth daily with supper.     . Semaglutide,0.25 or 0.5MG /DOS, (OZEMPIC, 0.25 OR 0.5 MG/DOSE,) 2 MG/1.5ML SOPN Inject 0.25 mg into the skin every Tuesday. At lunch    . spironolactone (ALDACTONE) 50 MG tablet Take 50 mg by mouth 3 (three) times daily.      . TRESIBA FLEXTOUCH 200 UNIT/ML SOPN Inject 46 Units into the skin at bedtime.    Marland Kitchen VASCEPA 1 g CAPS Take 1 g by mouth 4 (four) times daily.    Marland Kitchen buPROPion (WELLBUTRIN) 75 MG tablet Take 1 tablet (75 mg total) by mouth daily. 30 tablet 3   Current Facility-Administered Medications  Medication Dose Route Frequency Provider Last Rate Last Admin  . sodium chloride flush (NS) 0.9 % injection 3 mL  3 mL Intravenous Q12H Satira Sark, MD       Allergies:  Levofloxacin, Tape, Hyoscyamine sulfate, and Metoclopramide   ROS:   No palpitations or syncope.  Physical Exam: VS:  BP 128/64   Pulse 80   Ht 5\' 8"  (1.727 m)   Wt (!) 349 lb (158.3 kg)   SpO2 95%   BMI 53.07 kg/m , BMI Body mass index is 53.07 kg/m.  Wt Readings from Last 3 Encounters:  06/26/19 (!) 349 lb (158.3 kg)  03/04/19 (!) 334 lb 12.8 oz (151.9 kg)  12/12/18 (!) 330 lb (149.7 kg)    General:  Morbidly obese woman, appears comfortable at rest. HEENT: Conjunctiva and lids normal, wearing a mask. Neck: Supple, no elevated JVP or carotid bruits, no thyromegaly. Lungs: Decreased breath sounds, nonlabored breathing at rest. Cardiac: Regular rate and rhythm, no S3 or significant systolic murmur. Extremities: Lymphedema bilaterally, compression stockings in place.  ECG:  An ECG dated 08/16/2018 was personally reviewed today and demonstrated:  Sinus rhythm with  prolonged PR interval and IVCD.  Recent Labwork: 08/16/2018: B Natriuretic Peptide 25.0; BUN 21;  Creatinine, Ser 0.65; Platelets 380 08/28/2018: Hemoglobin 10.5; Potassium 3.6; Sodium 140  June 2021: Hemoglobin 10.4, platelets 329, potassium 4.5, BUN 18, creatinine 0.85  Other Studies Reviewed Today:  Cardiac catheterization 08/28/2018:  The left ventricular systolic function is normal.  The left ventricular ejection fraction is 55-65% by visual estimate.  1. No angiographic evidence of CAD 2. Mildly elevated left ventricular filling pressures 3. Dyspnea likely multifactorial due to COPD, obesity and physical deconditioning with chronic diastolic CHF  No further ischemic workup.   Echocardiogram 06/17/2018([name]): Overall limited study based on report. LVEF described as normal range with calculation provided at 54.8%. Valves not well visualized.  Assessment and Plan:  1.  Progressive lymphedema as noted above.  She is already on high-dose diuretics and uses compression stockings.  We will refer her for PT to consider mechanical compression techniques.  2.  Paroxysmal atrial fibrillation.  She is doing well in terms of symptom control on Cardizem CD, flecainide, and Xarelto.  Recent lab work reviewed.  She is chronically anemic, no significant change in hemoglobin.  Renal function is also normal.  Check CBC and BMET for next visit.  3.  Normal epicardial coronary arteries by cardiac catheterization last year.  Medication Adjustments/Labs and Tests Ordered: Current medicines are reviewed at length with the patient today.  Concerns regarding medicines are outlined above.   Tests Ordered: Orders Placed This Encounter  Procedures  . Basic metabolic panel  . CBC  . Ambulatory referral to Physical Therapy  . EKG 12-Lead    Medication Changes: No orders of the defined types were placed in this encounter.   Disposition:  Follow up 6 months in the [place]  office.  Signed, Satira Sark, MD, [facility] 06/26/2019 1:35 PM    [facility] [address], [place], [place] [address] Phone: [phone]; Fax: [phone]

## 2019-06-26 NOTE — Patient Instructions (Addendum)
Medication Instructions:   Your physician recommends that you continue on your current medications as directed. Please refer to the Current Medication list given to you today.  Labwork:  Your physician recommends that you return for non-fasting lab work in: 6 months just before your next visit to check your BMET & CBC.   Testing/Procedures:  NONE  Follow-Up:  Your physician recommends that you schedule a follow-up appointment in: 6 months (office). You will receive a reminder letter in the mail in about 4 months reminding you to call and schedule your appointment. If you don't receive this letter, please contact our office.  Any Other Special Instructions Will Be Listed Below (If Applicable).  You have been referred to Physical Therapy.  If you need a refill on your cardiac medications before your next appointment, please call your pharmacy.

## 2019-07-02 DIAGNOSIS — Z299 Encounter for prophylactic measures, unspecified: Secondary | ICD-10-CM | POA: Diagnosis not present

## 2019-07-02 DIAGNOSIS — Z0000 Encounter for general adult medical examination without abnormal findings: Secondary | ICD-10-CM | POA: Diagnosis not present

## 2019-07-02 DIAGNOSIS — E119 Type 2 diabetes mellitus without complications: Secondary | ICD-10-CM | POA: Diagnosis not present

## 2019-07-02 DIAGNOSIS — E7800 Pure hypercholesterolemia, unspecified: Secondary | ICD-10-CM | POA: Diagnosis not present

## 2019-07-02 DIAGNOSIS — I2510 Atherosclerotic heart disease of native coronary artery without angina pectoris: Secondary | ICD-10-CM | POA: Diagnosis not present

## 2019-07-02 DIAGNOSIS — Z1211 Encounter for screening for malignant neoplasm of colon: Secondary | ICD-10-CM | POA: Diagnosis not present

## 2019-07-02 DIAGNOSIS — I10 Essential (primary) hypertension: Secondary | ICD-10-CM | POA: Diagnosis not present

## 2019-07-02 DIAGNOSIS — Z7189 Other specified counseling: Secondary | ICD-10-CM | POA: Diagnosis not present

## 2019-07-02 DIAGNOSIS — Z79899 Other long term (current) drug therapy: Secondary | ICD-10-CM | POA: Diagnosis not present

## 2019-07-02 DIAGNOSIS — R5383 Other fatigue: Secondary | ICD-10-CM | POA: Diagnosis not present

## 2019-07-10 DIAGNOSIS — J449 Chronic obstructive pulmonary disease, unspecified: Secondary | ICD-10-CM | POA: Diagnosis not present

## 2019-07-10 DIAGNOSIS — G894 Chronic pain syndrome: Secondary | ICD-10-CM | POA: Diagnosis not present

## 2019-07-10 DIAGNOSIS — M48061 Spinal stenosis, lumbar region without neurogenic claudication: Secondary | ICD-10-CM | POA: Diagnosis not present

## 2019-07-10 DIAGNOSIS — E1142 Type 2 diabetes mellitus with diabetic polyneuropathy: Secondary | ICD-10-CM | POA: Diagnosis not present

## 2019-07-10 DIAGNOSIS — G4733 Obstructive sleep apnea (adult) (pediatric): Secondary | ICD-10-CM | POA: Diagnosis not present

## 2019-07-10 DIAGNOSIS — Z79891 Long term (current) use of opiate analgesic: Secondary | ICD-10-CM | POA: Diagnosis not present

## 2019-07-14 ENCOUNTER — Telehealth: Payer: Self-pay | Admitting: Emergency Medicine

## 2019-07-14 NOTE — Telephone Encounter (Signed)
Pa started  Key: [id number] - PA Case ID: [id number] - Rx #: [id number] Need help? Call us at [phone]  Status pending  Sent to [employer]  DrugMotegrity 2MG  tablets  Form[employer]Rx Medicare Part D Electronic Prior Authorization Form (2017 NCPDP Pt notified

## 2019-07-15 DIAGNOSIS — R21 Rash and other nonspecific skin eruption: Secondary | ICD-10-CM | POA: Diagnosis not present

## 2019-07-15 DIAGNOSIS — I10 Essential (primary) hypertension: Secondary | ICD-10-CM | POA: Diagnosis not present

## 2019-07-15 DIAGNOSIS — Z299 Encounter for prophylactic measures, unspecified: Secondary | ICD-10-CM | POA: Diagnosis not present

## 2019-07-15 DIAGNOSIS — E1142 Type 2 diabetes mellitus with diabetic polyneuropathy: Secondary | ICD-10-CM | POA: Diagnosis not present

## 2019-07-15 DIAGNOSIS — E1165 Type 2 diabetes mellitus with hyperglycemia: Secondary | ICD-10-CM | POA: Diagnosis not present

## 2019-07-15 DIAGNOSIS — I872 Venous insufficiency (chronic) (peripheral): Secondary | ICD-10-CM | POA: Diagnosis not present

## 2019-07-23 DIAGNOSIS — H832X9 Labyrinthine dysfunction, unspecified ear: Secondary | ICD-10-CM | POA: Diagnosis not present

## 2019-07-23 DIAGNOSIS — I890 Lymphedema, not elsewhere classified: Secondary | ICD-10-CM | POA: Diagnosis not present

## 2019-07-23 DIAGNOSIS — R2681 Unsteadiness on feet: Secondary | ICD-10-CM | POA: Diagnosis not present

## 2019-07-24 DIAGNOSIS — B351 Tinea unguium: Secondary | ICD-10-CM | POA: Diagnosis not present

## 2019-07-24 DIAGNOSIS — E1142 Type 2 diabetes mellitus with diabetic polyneuropathy: Secondary | ICD-10-CM | POA: Diagnosis not present

## 2019-07-24 DIAGNOSIS — L84 Corns and callosities: Secondary | ICD-10-CM | POA: Diagnosis not present

## 2019-07-24 DIAGNOSIS — M79676 Pain in unspecified toe(s): Secondary | ICD-10-CM | POA: Diagnosis not present

## 2019-07-29 DIAGNOSIS — I890 Lymphedema, not elsewhere classified: Secondary | ICD-10-CM | POA: Diagnosis not present

## 2019-07-29 DIAGNOSIS — H832X9 Labyrinthine dysfunction, unspecified ear: Secondary | ICD-10-CM | POA: Diagnosis not present

## 2019-07-29 DIAGNOSIS — R2681 Unsteadiness on feet: Secondary | ICD-10-CM | POA: Diagnosis not present

## 2019-07-31 DIAGNOSIS — H832X9 Labyrinthine dysfunction, unspecified ear: Secondary | ICD-10-CM | POA: Diagnosis not present

## 2019-07-31 DIAGNOSIS — I890 Lymphedema, not elsewhere classified: Secondary | ICD-10-CM | POA: Diagnosis not present

## 2019-07-31 DIAGNOSIS — R2681 Unsteadiness on feet: Secondary | ICD-10-CM | POA: Diagnosis not present

## 2019-08-01 DIAGNOSIS — E119 Type 2 diabetes mellitus without complications: Secondary | ICD-10-CM | POA: Diagnosis not present

## 2019-08-01 DIAGNOSIS — I2510 Atherosclerotic heart disease of native coronary artery without angina pectoris: Secondary | ICD-10-CM | POA: Diagnosis not present

## 2019-08-01 DIAGNOSIS — I10 Essential (primary) hypertension: Secondary | ICD-10-CM | POA: Diagnosis not present

## 2019-08-05 DIAGNOSIS — I890 Lymphedema, not elsewhere classified: Secondary | ICD-10-CM | POA: Diagnosis not present

## 2019-08-05 DIAGNOSIS — H832X9 Labyrinthine dysfunction, unspecified ear: Secondary | ICD-10-CM | POA: Diagnosis not present

## 2019-08-05 DIAGNOSIS — R2681 Unsteadiness on feet: Secondary | ICD-10-CM | POA: Diagnosis not present

## 2019-08-07 DIAGNOSIS — E1142 Type 2 diabetes mellitus with diabetic polyneuropathy: Secondary | ICD-10-CM | POA: Diagnosis not present

## 2019-08-07 DIAGNOSIS — M48061 Spinal stenosis, lumbar region without neurogenic claudication: Secondary | ICD-10-CM | POA: Diagnosis not present

## 2019-08-07 DIAGNOSIS — G894 Chronic pain syndrome: Secondary | ICD-10-CM | POA: Diagnosis not present

## 2019-08-07 DIAGNOSIS — Z79891 Long term (current) use of opiate analgesic: Secondary | ICD-10-CM | POA: Diagnosis not present

## 2019-08-07 NOTE — Telephone Encounter (Signed)
[name] called to check the status of Motegrity 2 mg. [phone]

## 2019-08-08 DIAGNOSIS — R2681 Unsteadiness on feet: Secondary | ICD-10-CM | POA: Diagnosis not present

## 2019-08-08 DIAGNOSIS — H832X9 Labyrinthine dysfunction, unspecified ear: Secondary | ICD-10-CM | POA: Diagnosis not present

## 2019-08-08 DIAGNOSIS — I890 Lymphedema, not elsewhere classified: Secondary | ICD-10-CM | POA: Diagnosis not present

## 2019-08-11 DIAGNOSIS — R2681 Unsteadiness on feet: Secondary | ICD-10-CM | POA: Diagnosis not present

## 2019-08-11 DIAGNOSIS — H832X9 Labyrinthine dysfunction, unspecified ear: Secondary | ICD-10-CM | POA: Diagnosis not present

## 2019-08-11 DIAGNOSIS — I890 Lymphedema, not elsewhere classified: Secondary | ICD-10-CM | POA: Diagnosis not present

## 2019-08-13 NOTE — Telephone Encounter (Signed)
I spoke with [facility], d/t we had an approval already on file from March 2021. Katie at [facility] stated the pt changed insurances in May 2021. I have completed another PA due to other PA completed was not correct and had been cancelled by the insurance company. PA has now been approved and faxed approval to [facility].

## 2019-08-15 DIAGNOSIS — Z713 Dietary counseling and surveillance: Secondary | ICD-10-CM | POA: Diagnosis not present

## 2019-08-15 DIAGNOSIS — Z87891 Personal history of nicotine dependence: Secondary | ICD-10-CM | POA: Diagnosis not present

## 2019-08-15 DIAGNOSIS — G4730 Sleep apnea, unspecified: Secondary | ICD-10-CM | POA: Diagnosis not present

## 2019-08-15 DIAGNOSIS — I890 Lymphedema, not elsewhere classified: Secondary | ICD-10-CM | POA: Diagnosis not present

## 2019-08-15 DIAGNOSIS — Z299 Encounter for prophylactic measures, unspecified: Secondary | ICD-10-CM | POA: Diagnosis not present

## 2019-08-18 DIAGNOSIS — I872 Venous insufficiency (chronic) (peripheral): Secondary | ICD-10-CM | POA: Diagnosis not present

## 2019-08-18 DIAGNOSIS — E1165 Type 2 diabetes mellitus with hyperglycemia: Secondary | ICD-10-CM | POA: Diagnosis not present

## 2019-08-18 DIAGNOSIS — I10 Essential (primary) hypertension: Secondary | ICD-10-CM | POA: Diagnosis not present

## 2019-08-18 DIAGNOSIS — Z299 Encounter for prophylactic measures, unspecified: Secondary | ICD-10-CM | POA: Diagnosis not present

## 2019-08-18 DIAGNOSIS — I4891 Unspecified atrial fibrillation: Secondary | ICD-10-CM | POA: Diagnosis not present

## 2019-08-18 DIAGNOSIS — I5032 Chronic diastolic (congestive) heart failure: Secondary | ICD-10-CM | POA: Diagnosis not present

## 2019-08-18 DIAGNOSIS — J449 Chronic obstructive pulmonary disease, unspecified: Secondary | ICD-10-CM | POA: Diagnosis not present

## 2019-08-18 DIAGNOSIS — I2510 Atherosclerotic heart disease of native coronary artery without angina pectoris: Secondary | ICD-10-CM | POA: Diagnosis not present

## 2019-08-18 DIAGNOSIS — I890 Lymphedema, not elsewhere classified: Secondary | ICD-10-CM | POA: Diagnosis not present

## 2019-08-19 DIAGNOSIS — R2681 Unsteadiness on feet: Secondary | ICD-10-CM | POA: Diagnosis not present

## 2019-08-19 DIAGNOSIS — H832X9 Labyrinthine dysfunction, unspecified ear: Secondary | ICD-10-CM | POA: Diagnosis not present

## 2019-08-19 DIAGNOSIS — I890 Lymphedema, not elsewhere classified: Secondary | ICD-10-CM | POA: Diagnosis not present

## 2019-08-21 DIAGNOSIS — I890 Lymphedema, not elsewhere classified: Secondary | ICD-10-CM | POA: Diagnosis not present

## 2019-08-21 DIAGNOSIS — H832X9 Labyrinthine dysfunction, unspecified ear: Secondary | ICD-10-CM | POA: Diagnosis not present

## 2019-08-21 DIAGNOSIS — R2681 Unsteadiness on feet: Secondary | ICD-10-CM | POA: Diagnosis not present

## 2019-08-22 DIAGNOSIS — I872 Venous insufficiency (chronic) (peripheral): Secondary | ICD-10-CM | POA: Diagnosis not present

## 2019-08-22 DIAGNOSIS — Z299 Encounter for prophylactic measures, unspecified: Secondary | ICD-10-CM | POA: Diagnosis not present

## 2019-08-22 DIAGNOSIS — I890 Lymphedema, not elsewhere classified: Secondary | ICD-10-CM | POA: Diagnosis not present

## 2019-08-22 DIAGNOSIS — L03116 Cellulitis of left lower limb: Secondary | ICD-10-CM | POA: Diagnosis not present

## 2019-08-22 DIAGNOSIS — I10 Essential (primary) hypertension: Secondary | ICD-10-CM | POA: Diagnosis not present

## 2019-08-22 DIAGNOSIS — I4891 Unspecified atrial fibrillation: Secondary | ICD-10-CM | POA: Diagnosis not present

## 2019-08-25 DIAGNOSIS — I808 Phlebitis and thrombophlebitis of other sites: Secondary | ICD-10-CM | POA: Diagnosis not present

## 2019-08-26 DIAGNOSIS — R2681 Unsteadiness on feet: Secondary | ICD-10-CM | POA: Diagnosis not present

## 2019-08-26 DIAGNOSIS — G4730 Sleep apnea, unspecified: Secondary | ICD-10-CM | POA: Diagnosis not present

## 2019-08-26 DIAGNOSIS — I890 Lymphedema, not elsewhere classified: Secondary | ICD-10-CM | POA: Diagnosis not present

## 2019-08-26 DIAGNOSIS — Z299 Encounter for prophylactic measures, unspecified: Secondary | ICD-10-CM | POA: Diagnosis not present

## 2019-08-26 DIAGNOSIS — Z87891 Personal history of nicotine dependence: Secondary | ICD-10-CM | POA: Diagnosis not present

## 2019-08-26 DIAGNOSIS — I10 Essential (primary) hypertension: Secondary | ICD-10-CM | POA: Diagnosis not present

## 2019-08-26 DIAGNOSIS — L03116 Cellulitis of left lower limb: Secondary | ICD-10-CM | POA: Diagnosis not present

## 2019-08-26 DIAGNOSIS — H832X9 Labyrinthine dysfunction, unspecified ear: Secondary | ICD-10-CM | POA: Diagnosis not present

## 2019-08-27 DIAGNOSIS — I2510 Atherosclerotic heart disease of native coronary artery without angina pectoris: Secondary | ICD-10-CM | POA: Diagnosis not present

## 2019-08-27 DIAGNOSIS — I10 Essential (primary) hypertension: Secondary | ICD-10-CM | POA: Diagnosis not present

## 2019-08-27 DIAGNOSIS — E119 Type 2 diabetes mellitus without complications: Secondary | ICD-10-CM | POA: Diagnosis not present

## 2019-08-28 DIAGNOSIS — E1142 Type 2 diabetes mellitus with diabetic polyneuropathy: Secondary | ICD-10-CM | POA: Diagnosis not present

## 2019-08-28 DIAGNOSIS — G4730 Sleep apnea, unspecified: Secondary | ICD-10-CM | POA: Diagnosis not present

## 2019-08-28 DIAGNOSIS — I5032 Chronic diastolic (congestive) heart failure: Secondary | ICD-10-CM | POA: Diagnosis not present

## 2019-08-28 DIAGNOSIS — Z299 Encounter for prophylactic measures, unspecified: Secondary | ICD-10-CM | POA: Diagnosis not present

## 2019-08-28 DIAGNOSIS — E1165 Type 2 diabetes mellitus with hyperglycemia: Secondary | ICD-10-CM | POA: Diagnosis not present

## 2019-08-28 DIAGNOSIS — I10 Essential (primary) hypertension: Secondary | ICD-10-CM | POA: Diagnosis not present

## 2019-08-29 DIAGNOSIS — R2681 Unsteadiness on feet: Secondary | ICD-10-CM | POA: Diagnosis not present

## 2019-08-29 DIAGNOSIS — I890 Lymphedema, not elsewhere classified: Secondary | ICD-10-CM | POA: Diagnosis not present

## 2019-08-29 DIAGNOSIS — H832X9 Labyrinthine dysfunction, unspecified ear: Secondary | ICD-10-CM | POA: Diagnosis not present

## 2019-09-03 DIAGNOSIS — H832X9 Labyrinthine dysfunction, unspecified ear: Secondary | ICD-10-CM | POA: Diagnosis not present

## 2019-09-03 DIAGNOSIS — I890 Lymphedema, not elsewhere classified: Secondary | ICD-10-CM | POA: Diagnosis not present

## 2019-09-03 DIAGNOSIS — R2681 Unsteadiness on feet: Secondary | ICD-10-CM | POA: Diagnosis not present

## 2019-09-04 ENCOUNTER — Ambulatory Visit (HOSPITAL_COMMUNITY): Payer: Medicare Other | Admitting: Physical Therapy

## 2019-09-04 DIAGNOSIS — L97511 Non-pressure chronic ulcer of other part of right foot limited to breakdown of skin: Secondary | ICD-10-CM | POA: Diagnosis not present

## 2019-09-04 DIAGNOSIS — H832X9 Labyrinthine dysfunction, unspecified ear: Secondary | ICD-10-CM | POA: Diagnosis not present

## 2019-09-04 DIAGNOSIS — R2681 Unsteadiness on feet: Secondary | ICD-10-CM | POA: Diagnosis not present

## 2019-09-04 DIAGNOSIS — I890 Lymphedema, not elsewhere classified: Secondary | ICD-10-CM | POA: Diagnosis not present

## 2019-09-05 DIAGNOSIS — I87313 Chronic venous hypertension (idiopathic) with ulcer of bilateral lower extremity: Secondary | ICD-10-CM | POA: Diagnosis not present

## 2019-09-09 ENCOUNTER — Encounter: Payer: Self-pay | Admitting: Gastroenterology

## 2019-09-09 ENCOUNTER — Other Ambulatory Visit: Payer: Self-pay

## 2019-09-09 ENCOUNTER — Ambulatory Visit (INDEPENDENT_AMBULATORY_CARE_PROVIDER_SITE_OTHER): Payer: Medicare Other | Admitting: Gastroenterology

## 2019-09-09 VITALS — BP 132/63 | HR 74 | Temp 97.8°F | Ht 68.0 in | Wt 346.4 lb

## 2019-09-09 DIAGNOSIS — R110 Nausea: Secondary | ICD-10-CM | POA: Diagnosis not present

## 2019-09-09 DIAGNOSIS — K219 Gastro-esophageal reflux disease without esophagitis: Secondary | ICD-10-CM

## 2019-09-09 DIAGNOSIS — K582 Mixed irritable bowel syndrome: Secondary | ICD-10-CM

## 2019-09-09 MED ORDER — ONDANSETRON HCL 4 MG PO TABS: ORAL_TABLET | ORAL | 3 refills | Status: DC

## 2019-09-09 MED ORDER — ESOMEPRAZOLE MAGNESIUM 40 MG PO CPDR: 40.0000 mg | DELAYED_RELEASE_CAPSULE | Freq: Every day | ORAL | 3 refills | Status: DC

## 2019-09-09 NOTE — Patient Instructions (Signed)
We will see you in  1 year!  Please call if any concerns or questions in the meantime!  I enjoyed seeing you again today! As you know, I value our relationship and want to provide genuine, compassionate, and quality care. I welcome your feedback. If you receive a survey regarding your visit,  I greatly appreciate you taking time to fill this out. See you next time!  Annitta Needs, PhD, ANP-BC [facility]

## 2019-09-09 NOTE — Progress Notes (Signed)
Referring Provider: Glenda Chroman, MD Primary Care Physician:  Glenda Chroman, MD Primary GI: Dr. Gala Romney    Chief Complaint  Patient presents with  . Gastroesophageal Reflux    does not feel dexilant works as good as nexium  . Constipation    has a regimen she takes to help    HPI:   [name] [name] is a 59 y.o. female presenting today with a history of history of OIC, GERD, gastroparesis for routine follow-up. Colonoscopy due again in 2023.   Bowel regimen includes Motegrity, Linzess290 mcg.  1 capful Miralax, sometimes 2 scoops. Two stool softeners a day. This regimen has helped with constipation.Colonoscopy due again in 2023.BM daily with this regimen. If doesn't go every day, will get miserable. Gets nauseated if constipated.   Nexium has worked better than [employer]. Linzess 145 not strong enough in the past.   Past Medical History:  Diagnosis Date  . Bladder dysfunction    Self urinary catheterization  . COPD (chronic obstructive pulmonary disease) ([place])   . DDD (degenerative disc disease)   . Depression   . Esophageal spasm    NTG and Norvasc  . Essential hypertension   . Fibromyalgia   . Gastroparesis   . GERD (gastroesophageal reflux disease)   . History of cardiac catheterization    Normal coronary arteries  . History of pituitary tumor   . Irritable bowel syndrome   . Lymphedema   . Osteoarthritis   . PAF (paroxysmal atrial fibrillation) ([place])   . Sleep apnea    BIPAP  . Type 2 diabetes mellitus ([place])   . Urinary tract infection     Past Surgical History:  Procedure Laterality Date  . ABDOMINAL HYSTERECTOMY    . ANTERIOR CERVICAL DECOMP/DISCECTOMY FUSION N/A 12/02/2014   Procedure: Cervical five cervical six anterior cervical decompression with fusion interbody prosthesis plating and bone graft;  Surgeon: Newman Pies, MD;  Location: [place] [facility];  Service: Neurosurgery;  Laterality: N/A;  C56 anterior cervical decompression with fusion  interbody prosthesis plating and bonegraft  . APPENDECTOMY    . BACK SURGERY    . CARDIAC CATHETERIZATION    . CARDIOVERSION N/A 03/26/2014   Procedure: CARDIOVERSION;  Surgeon: Herminio Commons, MD;  Location: [facility];  Service: Endoscopy;  Laterality: N/A;  . CARDIOVERSION N/A 05/04/2014   Procedure: CARDIOVERSION;  Surgeon: Satira Sark, MD;  Location: [facility];  Service: Cardiovascular;  Laterality: N/A;  . CHOLECYSTECTOMY    . COLONOSCOPY     Approximately 2003. Per medical records, internal hemorrhoids noted  . COLONOSCOPY N/A 06/25/2013   Dr. Gala Romney: Anal canal hemorrhoids-more likely the source of paper hematochezia. Redundant, capacious colon. Multiple colonic polyps-tubular adenoma  . COLONOSCOPY WITH PROPOFOL N/A 12/04/2016   Dr. Gala Romney: redundant colon, colonic diverticulosis. Screening due in 2023  . ENDOVENOUS ABLATION SAPHENOUS VEIN W/ LASER Left 03/07/2018   endovenous ablation left greater saphenous vein by Deitra Mayo MD   . ESOPHAGOGASTRODUODENOSCOPY  10/12/2004   [facility]:XNAT plaquing on the esophageal mucosa of uncertain significance, not typical of what is seen with candida esophagitis status post KOH brushing for KOH prep and biopsy for histology. Rule out candida esophagitis/eosinophilic esophagitis. Otherwise normal esophagus. Tiny hiatal hernia. Otherwise, normal stomach, normal D1 and D2. Benign biopsy of esophagus, unknown KOH status.   . ESOPHAGOGASTRODUODENOSCOPY N/A 06/25/2013   Dr. Rourk:mild chronic gastritis  . KNEE SURGERY    . LEFT HEART CATHETERIZATION WITH CORONARY ANGIOGRAM N/A 11/15/2010  Procedure: LEFT HEART CATHETERIZATION WITH CORONARY ANGIOGRAM;  Surgeon: Laverda Page, MD;  Location: [facility] CATH LAB;  Service: Cardiovascular;  Laterality: N/A;  . LUNG BIOPSY    . RIGHT/LEFT HEART CATH AND CORONARY ANGIOGRAPHY N/A 08/28/2018   Procedure: RIGHT/LEFT HEART CATH AND CORONARY ANGIOGRAPHY;  Surgeon: Burnell Blanks, MD;  Location: [place]  CV LAB;  Service: Cardiovascular;  Laterality: N/A;    Current Outpatient Medications  Medication Sig Dispense Refill  . albuterol (PROAIR HFA) 108 (90 BASE) MCG/ACT inhaler Inhale 2 puffs into the lungs every 6 (six) hours as needed for wheezing or shortness of breath.     Marland Kitchen albuterol (PROVENTIL) (2.5 MG/3ML) 0.083% nebulizer solution Take 2.5 mg by nebulization every 6 (six) hours as needed for wheezing. For shortness of breath.    Marland Kitchen atorvastatin (LIPITOR) 10 MG tablet Take 10 mg by mouth at bedtime.  3  . baclofen (LIORESAL) 20 MG tablet Take 20 mg by mouth 3 (three) times daily.      Marland Kitchen buPROPion (WELLBUTRIN) 75 MG tablet Take 1 tablet (75 mg total) by mouth daily. 30 tablet 3  . CONSTULOSE 10 GM/15ML solution TAKE ONE TABLESPOONFUL (15ML) BY MOUTH DAILY 473 mL 5  . dexlansoprazole (DEXILANT) 60 MG capsule Take 1 capsule (60 mg total) by mouth daily. 90 capsule 3  . diclofenac sodium (VOLTAREN) 1 % GEL Apply 2-4 g topically 4 (four) times daily as needed (for pain).     Marland Kitchen diltiazem (CARDIZEM CD) 120 MG 24 hr capsule Take 1 capsule (120 mg total) by mouth daily. 90 capsule 3  . docusate sodium (COLACE) 100 MG capsule Take 100 mg by mouth 2 (two) times daily.    . famotidine (PEPCID) 20 MG tablet TAKE 1 TABLET BY MOUTH EVERY DAY AS NEEDED FOR HEARTBURN OR INDIGESTION 30 tablet 3  . fentaNYL (DURAGESIC) 12 MCG/HR Place 1 patch onto the skin every other day.    . flecainide (TAMBOCOR) 50 MG tablet TAKE TWO TABLETS BY MOUTH EVERY MORNING and TAKE TWO TABLETS BY MOUTH EVERY EVENING 120 tablet 6  . fluticasone (FLONASE) 50 MCG/ACT nasal spray Place 2 sprays into both nostrils daily.  6  . fluticasone furoate-vilanterol (BREO ELLIPTA) 200-25 MCG/INH AEPB Inhale 1 puff into the lungs daily.    . furosemide (LASIX) 80 MG tablet Take 80 mg by mouth 2 (two) times daily.      Marland Kitchen gabapentin (NEURONTIN) 800 MG tablet Take 800 mg by mouth 4 (four) times daily.   2  . glimepiride (AMARYL) 4 MG tablet Take 4  mg by mouth 2 (two) times daily.    . hydrocortisone (ANUSOL-HC) 2.5 % rectal cream Place 1 application rectally 2 (two) times daily. (Patient taking differently: Place 1 application rectally 2 (two) times daily as needed for hemorrhoids. ) 30 g 1  . isosorbide mononitrate (IMDUR) 120 MG 24 hr tablet Take 120 mg by mouth daily. In the morning    . isosorbide mononitrate (IMDUR) 30 MG 24 hr tablet Take 30 mg by mouth every evening.     Marland Kitchen LINZESS 290 MCG CAPS capsule TAKE ONE CAPSULE BY MOUTH EVERY DAY BEFORE BREAKFAST - REPLACES MOVANTIK 30 capsule 5  . lisinopril (PRINIVIL,ZESTRIL) 10 MG tablet Take 10 mg by mouth every evening.     . loratadine (CLARITIN) 10 MG tablet Take 10 mg by mouth daily.      . meclizine (ANTIVERT) 25 MG tablet Take 25 mg by mouth 3 (three) times daily as needed  for dizziness.     . metFORMIN (GLUCOPHAGE) 500 MG tablet Take 1,000 mg by mouth 2 (two) times daily.     . metolazone (ZAROXOLYN) 2.5 MG tablet Take 2.5 mg by mouth daily with breakfast.     . MOTEGRITY 2 MG TABS TAKE 1 TABLET BY MOUTH DAILY 30 tablet 5  . Multiple Vitamin (MULTIVITAMIN WITH MINERALS) TABS tablet Take 1 tablet by mouth daily.    . nitroGLYCERIN (NITROLINGUAL) 0.4 MG/SPRAY spray USE 1 SPRAY UNDER THE TONGUE AS NEEDED FOR CHEST PAIN - PA SENT 11/28/2016- JB (Patient taking differently: Place 1 spray under the tongue every 5 (five) minutes x 3 doses as needed for chest pain. ) 12 g 3  . nystatin (MYCOSTATIN/NYSTOP) 100000 UNIT/GM POWD Apply 1 g topically daily. After bathing    . ondansetron (ZOFRAN) 4 MG tablet TAKE 1 TABLET BY MOUTH EVERY 8 HOURS AS NEEDED FOR NAUSEA AND VOMITING 30 tablet 3  . oxyCODONE-acetaminophen (PERCOCET) 10-325 MG per tablet Take 1 tablet by mouth every 6 (six) hours as needed for pain.     . polyethylene glycol powder (GLYCOLAX/MIRALAX) powder Take 17-34 g by mouth daily.     . potassium chloride SA (K-DUR,KLOR-CON) 20 MEQ tablet Take 40 mEq by mouth 5 (five) times daily.      . Rivaroxaban (XARELTO) 20 MG TABS Take 20 mg by mouth daily with supper.     . Semaglutide,0.25 or 0.5MG /DOS, (OZEMPIC, 0.25 OR 0.5 MG/DOSE,) 2 MG/1.5ML SOPN Inject 0.25 mg into the skin every Tuesday. At lunch    . spironolactone (ALDACTONE) 50 MG tablet Take 50 mg by mouth 3 (three) times daily.      . TRESIBA FLEXTOUCH 200 UNIT/ML SOPN Inject 46 Units into the skin at bedtime.    Marland Kitchen VASCEPA 1 g CAPS Take 1 g by mouth 4 (four) times daily.    Marland Kitchen NEXIUM 40 MG capsule TAKE ONE CAPSULE BY MOUTH EVERY DAY BEFORE BREAKFAST (seven IN THE MORNING PER pt) (Patient not taking: Reported on 09/09/2019) 90 capsule 3   Current Facility-Administered Medications  Medication Dose Route Frequency Provider Last Rate Last Admin  . sodium chloride flush (NS) 0.9 % injection 3 mL  3 mL Intravenous Q12H Satira Sark, MD        Allergies as of 09/09/2019 - Review Complete 09/09/2019  Allergen Reaction Noted  . Levofloxacin Other (See Comments) 08/17/2011  . Tape Other (See Comments) 11/21/2016  . Hyoscyamine sulfate Other (See Comments)   . Metoclopramide Other (See Comments) 08/17/2011    Family History  Problem Relation Age of Onset  . Coronary artery disease Father        Died age 83  . Heart attack Father   . Arrhythmia Father        AF  . Diabetes Father   . Parkinson's disease Father   . Arrhythmia Mother        AF  . Stroke Mother   . Dementia Mother   . Cancer Mother        UTERINE   . Parkinson's disease Mother   . Arrhythmia Brother        AF  . Colon cancer Maternal Grandfather   . Colon cancer Paternal Aunt   . Colon cancer Paternal Aunt   . Diabetes Son   . Cancer Sister        BREAST   . Depression Sister   . Depression Sister   . Depression Sister  Social History   Socioeconomic History  . Marital status: Divorced    Spouse name: Not on file  . Number of children: Not on file  . Years of education: Not on file  . Highest education level: Not on file    Occupational History  . Not on file  Tobacco Use  . Smoking status: Former Smoker    Packs/day: 1.50    Years: 30.00    Pack years: 45.00    Types: Cigarettes    Start date: 03/06/1977    Quit date: 01/03/2007    Years since quitting: 12.6  . Smokeless tobacco: Never Used  Vaping Use  . Vaping Use: Never used  Substance and Sexual Activity  . Alcohol use: No    Alcohol/week: 0.0 standard drinks  . Drug use: No  . Sexual activity: Not on file  Other Topics Concern  . Not on file  Social History Narrative  . Not on file   Social Determinants of Health   Financial Resource Strain:   . Difficulty of Paying Living Expenses: Not on file  Food Insecurity:   . Worried About Charity fundraiser in the Last Year: Not on file  . Ran Out of Food in the Last Year: Not on file  Transportation Needs:   . Lack of Transportation (Medical): Not on file  . Lack of Transportation (Non-Medical): Not on file  Physical Activity:   . Days of Exercise per Week: Not on file  . Minutes of Exercise per Session: Not on file  Stress:   . Feeling of Stress : Not on file  Social Connections:   . Frequency of Communication with Friends and Family: Not on file  . Frequency of Social Gatherings with Friends and Family: Not on file  . Attends Religious Services: Not on file  . Active Member of Clubs or Organizations: Not on file  . Attends Archivist Meetings: Not on file  . Marital Status: Not on file    Review of Systems: Gen: Denies fever, chills, anorexia. Denies fatigue, weakness, weight loss.  CV: Denies chest pain, palpitations, syncope, peripheral edema, and claudication. Resp: Denies dyspnea at rest, cough, wheezing, coughing up blood, and pleurisy. GI: see HPI Derm: Denies rash, itching, dry skin Psych: Denies depression, anxiety, memory loss, confusion. No homicidal or suicidal ideation.  Heme: Denies bruising, bleeding, and enlarged lymph nodes.  Physical Exam: BP 132/63    Pulse 74   Temp 97.8 F (36.6 C)   Ht 5\' 8"  (1.727 m)   Wt (!) 346 lb 6.4 oz (157.1 kg)   BMI 52.67 kg/m  General:   Alert and oriented. No distress noted. Pleasant and cooperative.  Head:  Normocephalic and atraumatic. Eyes:  Conjuctiva clear without scleral icterus. Mouth:  Mask in place Abdomen:  +BS, soft, obese, no TTP, limited exam with patient sitting up in chair.  Msk:  Symmetrical without gross deformities. Normal posture. Extremities:  With lymphedema Neurologic:  Alert and  oriented x4 Psych:  Alert and cooperative. Normal mood and affect.  ASSESSMENT: [name] [name] is a 59 y.o. female presenting today with history of OIC, GERD, gastroparesis for routine follow-up.  Bowel regimen including Motegrity, Linzess 290 mcg, Miralax titrated, and stool softeners continues to work well for patient.   GERD best controlled with Nexium, and I have sent this to the pharmacy.  Colonoscopy will be due in 2023.   Overall, she is doing well from our standpoint. We will see her  back in 1 year or sooner if needed.    PLAN:  Continue bowel regimen  Nexium sent to pharmacy  Return in 1 year  Colonoscopy 2023  Annitta Needs, PhD, [facility]

## 2019-09-10 DIAGNOSIS — I890 Lymphedema, not elsewhere classified: Secondary | ICD-10-CM | POA: Diagnosis not present

## 2019-09-10 DIAGNOSIS — H832X9 Labyrinthine dysfunction, unspecified ear: Secondary | ICD-10-CM | POA: Diagnosis not present

## 2019-09-10 DIAGNOSIS — R2681 Unsteadiness on feet: Secondary | ICD-10-CM | POA: Diagnosis not present

## 2019-09-10 NOTE — Progress Notes (Signed)
CC'ED TO PCP 

## 2019-09-11 DIAGNOSIS — G894 Chronic pain syndrome: Secondary | ICD-10-CM | POA: Diagnosis not present

## 2019-09-11 DIAGNOSIS — Z79891 Long term (current) use of opiate analgesic: Secondary | ICD-10-CM | POA: Diagnosis not present

## 2019-09-11 DIAGNOSIS — M48061 Spinal stenosis, lumbar region without neurogenic claudication: Secondary | ICD-10-CM | POA: Diagnosis not present

## 2019-09-11 DIAGNOSIS — E1142 Type 2 diabetes mellitus with diabetic polyneuropathy: Secondary | ICD-10-CM | POA: Diagnosis not present

## 2019-09-12 DIAGNOSIS — R2681 Unsteadiness on feet: Secondary | ICD-10-CM | POA: Diagnosis not present

## 2019-09-12 DIAGNOSIS — I890 Lymphedema, not elsewhere classified: Secondary | ICD-10-CM | POA: Diagnosis not present

## 2019-09-12 DIAGNOSIS — H832X9 Labyrinthine dysfunction, unspecified ear: Secondary | ICD-10-CM | POA: Diagnosis not present

## 2019-09-18 DIAGNOSIS — I890 Lymphedema, not elsewhere classified: Secondary | ICD-10-CM | POA: Diagnosis not present

## 2019-09-18 DIAGNOSIS — H832X9 Labyrinthine dysfunction, unspecified ear: Secondary | ICD-10-CM | POA: Diagnosis not present

## 2019-09-18 DIAGNOSIS — R2681 Unsteadiness on feet: Secondary | ICD-10-CM | POA: Diagnosis not present

## 2019-09-23 DIAGNOSIS — I890 Lymphedema, not elsewhere classified: Secondary | ICD-10-CM | POA: Diagnosis not present

## 2019-09-23 DIAGNOSIS — R2681 Unsteadiness on feet: Secondary | ICD-10-CM | POA: Diagnosis not present

## 2019-09-23 DIAGNOSIS — H832X9 Labyrinthine dysfunction, unspecified ear: Secondary | ICD-10-CM | POA: Diagnosis not present

## 2019-09-25 DIAGNOSIS — R2681 Unsteadiness on feet: Secondary | ICD-10-CM | POA: Diagnosis not present

## 2019-09-25 DIAGNOSIS — H832X9 Labyrinthine dysfunction, unspecified ear: Secondary | ICD-10-CM | POA: Diagnosis not present

## 2019-09-25 DIAGNOSIS — I890 Lymphedema, not elsewhere classified: Secondary | ICD-10-CM | POA: Diagnosis not present

## 2019-09-30 DIAGNOSIS — I890 Lymphedema, not elsewhere classified: Secondary | ICD-10-CM | POA: Diagnosis not present

## 2019-09-30 DIAGNOSIS — H832X9 Labyrinthine dysfunction, unspecified ear: Secondary | ICD-10-CM | POA: Diagnosis not present

## 2019-09-30 DIAGNOSIS — R2681 Unsteadiness on feet: Secondary | ICD-10-CM | POA: Diagnosis not present

## 2019-10-01 ENCOUNTER — Other Ambulatory Visit: Payer: Self-pay | Admitting: Cardiology

## 2019-10-02 DIAGNOSIS — H832X9 Labyrinthine dysfunction, unspecified ear: Secondary | ICD-10-CM | POA: Diagnosis not present

## 2019-10-02 DIAGNOSIS — I890 Lymphedema, not elsewhere classified: Secondary | ICD-10-CM | POA: Diagnosis not present

## 2019-10-02 DIAGNOSIS — E119 Type 2 diabetes mellitus without complications: Secondary | ICD-10-CM | POA: Diagnosis not present

## 2019-10-02 DIAGNOSIS — I10 Essential (primary) hypertension: Secondary | ICD-10-CM | POA: Diagnosis not present

## 2019-10-02 DIAGNOSIS — I2510 Atherosclerotic heart disease of native coronary artery without angina pectoris: Secondary | ICD-10-CM | POA: Diagnosis not present

## 2019-10-02 DIAGNOSIS — R2681 Unsteadiness on feet: Secondary | ICD-10-CM | POA: Diagnosis not present

## 2019-10-03 ENCOUNTER — Institutional Professional Consult (permissible substitution): Payer: Medicare Other | Admitting: Pulmonary Disease

## 2019-10-03 DIAGNOSIS — M170 Bilateral primary osteoarthritis of knee: Secondary | ICD-10-CM | POA: Diagnosis not present

## 2019-10-06 DIAGNOSIS — E1142 Type 2 diabetes mellitus with diabetic polyneuropathy: Secondary | ICD-10-CM | POA: Diagnosis not present

## 2019-10-06 DIAGNOSIS — G894 Chronic pain syndrome: Secondary | ICD-10-CM | POA: Diagnosis not present

## 2019-10-06 DIAGNOSIS — M48061 Spinal stenosis, lumbar region without neurogenic claudication: Secondary | ICD-10-CM | POA: Diagnosis not present

## 2019-10-06 DIAGNOSIS — Z79891 Long term (current) use of opiate analgesic: Secondary | ICD-10-CM | POA: Diagnosis not present

## 2019-10-08 DIAGNOSIS — I890 Lymphedema, not elsewhere classified: Secondary | ICD-10-CM | POA: Diagnosis not present

## 2019-10-08 DIAGNOSIS — R2681 Unsteadiness on feet: Secondary | ICD-10-CM | POA: Diagnosis not present

## 2019-10-08 DIAGNOSIS — H832X9 Labyrinthine dysfunction, unspecified ear: Secondary | ICD-10-CM | POA: Diagnosis not present

## 2019-10-09 DIAGNOSIS — G4733 Obstructive sleep apnea (adult) (pediatric): Secondary | ICD-10-CM | POA: Diagnosis not present

## 2019-10-09 DIAGNOSIS — H832X9 Labyrinthine dysfunction, unspecified ear: Secondary | ICD-10-CM | POA: Diagnosis not present

## 2019-10-09 DIAGNOSIS — I890 Lymphedema, not elsewhere classified: Secondary | ICD-10-CM | POA: Diagnosis not present

## 2019-10-09 DIAGNOSIS — R2681 Unsteadiness on feet: Secondary | ICD-10-CM | POA: Diagnosis not present

## 2019-10-09 DIAGNOSIS — J449 Chronic obstructive pulmonary disease, unspecified: Secondary | ICD-10-CM | POA: Diagnosis not present

## 2019-10-14 DIAGNOSIS — B351 Tinea unguium: Secondary | ICD-10-CM | POA: Diagnosis not present

## 2019-10-14 DIAGNOSIS — L84 Corns and callosities: Secondary | ICD-10-CM | POA: Diagnosis not present

## 2019-10-14 DIAGNOSIS — E1142 Type 2 diabetes mellitus with diabetic polyneuropathy: Secondary | ICD-10-CM | POA: Diagnosis not present

## 2019-10-14 DIAGNOSIS — M79676 Pain in unspecified toe(s): Secondary | ICD-10-CM | POA: Diagnosis not present

## 2019-10-15 DIAGNOSIS — H832X9 Labyrinthine dysfunction, unspecified ear: Secondary | ICD-10-CM | POA: Diagnosis not present

## 2019-10-15 DIAGNOSIS — I890 Lymphedema, not elsewhere classified: Secondary | ICD-10-CM | POA: Diagnosis not present

## 2019-10-15 DIAGNOSIS — R2681 Unsteadiness on feet: Secondary | ICD-10-CM | POA: Diagnosis not present

## 2019-10-16 DIAGNOSIS — R2681 Unsteadiness on feet: Secondary | ICD-10-CM | POA: Diagnosis not present

## 2019-10-16 DIAGNOSIS — I890 Lymphedema, not elsewhere classified: Secondary | ICD-10-CM | POA: Diagnosis not present

## 2019-10-16 DIAGNOSIS — H832X9 Labyrinthine dysfunction, unspecified ear: Secondary | ICD-10-CM | POA: Diagnosis not present

## 2019-10-23 DIAGNOSIS — I890 Lymphedema, not elsewhere classified: Secondary | ICD-10-CM | POA: Diagnosis not present

## 2019-10-30 ENCOUNTER — Other Ambulatory Visit: Payer: Self-pay | Admitting: Gastroenterology

## 2019-10-30 DIAGNOSIS — J449 Chronic obstructive pulmonary disease, unspecified: Secondary | ICD-10-CM | POA: Diagnosis not present

## 2019-10-30 DIAGNOSIS — R42 Dizziness and giddiness: Secondary | ICD-10-CM | POA: Diagnosis not present

## 2019-10-30 DIAGNOSIS — Z299 Encounter for prophylactic measures, unspecified: Secondary | ICD-10-CM | POA: Diagnosis not present

## 2019-10-30 DIAGNOSIS — E1165 Type 2 diabetes mellitus with hyperglycemia: Secondary | ICD-10-CM | POA: Diagnosis not present

## 2019-10-30 DIAGNOSIS — M25549 Pain in joints of unspecified hand: Secondary | ICD-10-CM | POA: Diagnosis not present

## 2019-10-30 DIAGNOSIS — I10 Essential (primary) hypertension: Secondary | ICD-10-CM | POA: Diagnosis not present

## 2019-10-31 DIAGNOSIS — E119 Type 2 diabetes mellitus without complications: Secondary | ICD-10-CM | POA: Diagnosis not present

## 2019-10-31 DIAGNOSIS — I10 Essential (primary) hypertension: Secondary | ICD-10-CM | POA: Diagnosis not present

## 2019-10-31 DIAGNOSIS — I2510 Atherosclerotic heart disease of native coronary artery without angina pectoris: Secondary | ICD-10-CM | POA: Diagnosis not present

## 2019-11-07 DIAGNOSIS — M170 Bilateral primary osteoarthritis of knee: Secondary | ICD-10-CM | POA: Diagnosis not present

## 2019-11-11 DIAGNOSIS — H5213 Myopia, bilateral: Secondary | ICD-10-CM | POA: Diagnosis not present

## 2019-11-11 DIAGNOSIS — E11319 Type 2 diabetes mellitus with unspecified diabetic retinopathy without macular edema: Secondary | ICD-10-CM | POA: Diagnosis not present

## 2019-11-11 DIAGNOSIS — H524 Presbyopia: Secondary | ICD-10-CM | POA: Diagnosis not present

## 2019-11-14 DIAGNOSIS — M170 Bilateral primary osteoarthritis of knee: Secondary | ICD-10-CM | POA: Diagnosis not present

## 2019-11-18 DIAGNOSIS — E1142 Type 2 diabetes mellitus with diabetic polyneuropathy: Secondary | ICD-10-CM | POA: Diagnosis not present

## 2019-11-18 DIAGNOSIS — L84 Corns and callosities: Secondary | ICD-10-CM | POA: Diagnosis not present

## 2019-11-18 DIAGNOSIS — L97521 Non-pressure chronic ulcer of other part of left foot limited to breakdown of skin: Secondary | ICD-10-CM | POA: Diagnosis not present

## 2019-11-21 DIAGNOSIS — M170 Bilateral primary osteoarthritis of knee: Secondary | ICD-10-CM | POA: Diagnosis not present

## 2019-11-25 ENCOUNTER — Ambulatory Visit (INDEPENDENT_AMBULATORY_CARE_PROVIDER_SITE_OTHER): Payer: Medicare Other | Admitting: Pulmonary Disease

## 2019-11-25 ENCOUNTER — Encounter: Payer: Self-pay | Admitting: Pulmonary Disease

## 2019-11-25 ENCOUNTER — Other Ambulatory Visit: Payer: Self-pay

## 2019-11-25 VITALS — BP 130/60 | HR 78 | Temp 97.3°F | Ht 68.0 in | Wt 337.4 lb

## 2019-11-25 DIAGNOSIS — Z23 Encounter for immunization: Secondary | ICD-10-CM

## 2019-11-25 DIAGNOSIS — G4733 Obstructive sleep apnea (adult) (pediatric): Secondary | ICD-10-CM | POA: Diagnosis not present

## 2019-11-25 DIAGNOSIS — J449 Chronic obstructive pulmonary disease, unspecified: Secondary | ICD-10-CM | POA: Diagnosis not present

## 2019-11-25 MED ORDER — ANORO ELLIPTA 62.5-25 MCG/INH IN AEPB: 1.0000 | INHALATION_SPRAY | Freq: Every day | RESPIRATORY_TRACT | 0 refills | Status: DC

## 2019-11-25 MED ORDER — FLUTICASONE PROPIONATE 50 MCG/ACT NA SUSP: 2.0000 | Freq: Every day | NASAL | 6 refills | Status: DC

## 2019-11-25 MED ORDER — LORATADINE 10 MG PO TABS: 10.0000 mg | ORAL_TABLET | Freq: Every day | ORAL | 11 refills | Status: AC

## 2019-11-25 NOTE — Progress Notes (Signed)
Subjective:    Patient ID: [name] [name], female    DOB: 01-09-1960, 59 y.o.   MRN: [id number]  HPI   Chief Complaint  Patient presents with  . Consult    Patient already on BIPAP, had to switch doctors due to insurance.  Machine is working good, sleeping good.    59 year old ex-smoker presents to establish care for COPD and OSA.  She was following with Dr. Felton Clinton in [place] pulmonary. She smoked about 2 packs/day for 30 years until she quit in 2009, more than 60 pack years.  She has been diagnosed with COPD and has been maintained on multiple medications over the years including Advair, Symbicort, and currently on Breo which causes her hoarseness.  She uses albuterol nebs on a as needed basis.  She used to be on ProAir which works better than her current Ventolin.  Dyspnea is worse during the summer and she breathes better in the cold weather. mMRC scale is 3, she ambulates with a cane which is related to knee osteoarthritis and neck fusion and neuropathy.  OSA was diagnosed several years ago she is on her second machine which she received in 2000, settings are 14/9 BiPAP, she could not tolerate CPAP because of high pressures. Epworth sleepiness score is 13 and she reports sleepiness while sitting and reading and lying down to rest in the afternoons and sitting quietly after lunch. Bedtime is between 11 PM and 2 AM, sleep latency is minimal if she goes to bed late, sleeps on her side with 2 pillows, multiple nocturnal awakenings including nocturia and is out of bed anytime between 5 and 10 AM feeling rested with some dryness of mouth but denies headaches. Weight fluctuates up and down within 30 pounds There is no history suggestive of cataplexy, sleep paralysis or parasomnias  She also had pulmonary nodules which were documented to be stable on serial CTs  PMH -osteoarthritis, morbid obesity, atrial fibrillation on Xarelto, bilateral lower extremity lymphedema  Significant tests/ events  reviewed PFTs CT chest NPSG    Past Medical History:  Diagnosis Date  . Bladder dysfunction    Self urinary catheterization  . COPD (chronic obstructive pulmonary disease) ([place])   . DDD (degenerative disc disease)   . Depression   . Esophageal spasm    NTG and Norvasc  . Essential hypertension   . Fibromyalgia   . Gastroparesis   . GERD (gastroesophageal reflux disease)   . History of cardiac catheterization    Normal coronary arteries  . History of pituitary tumor   . Irritable bowel syndrome   . Lymphedema   . Osteoarthritis   . PAF (paroxysmal atrial fibrillation) ([place])   . Sleep apnea    BIPAP  . Type 2 diabetes mellitus ([place])   . Urinary tract infection     Past Surgical History:  Procedure Laterality Date  . ABDOMINAL HYSTERECTOMY    . ANTERIOR CERVICAL DECOMP/DISCECTOMY FUSION N/A 12/02/2014   Procedure: Cervical five cervical six anterior cervical decompression with fusion interbody prosthesis plating and bone graft;  Surgeon: Newman Pies, MD;  Location: [place] NEUR[facility] ORS;  Service: Neurosurgery;  Laterality: N/A;  C56 anterior cervical decompression with fusion interbody prosthesis plating and bonegraft  . APPENDECTOMY    . BACK SURGERY    . CARDIAC CATHETERIZATION    . CARDIOVERSION N/A 03/26/2014   Procedure: CARDIOVERSION;  Surgeon: Herminio Commons, MD;  [facility]: [facility];  Service: Endoscopy;  Laterality: N/A;  . CARDIOVERSION N/A 05/04/2014  Procedure: CARDIOVERSION;  Surgeon: Satira Sark, MD;  Location: [facility];  Service: Cardiovascular;  Laterality: N/A;  . CHOLECYSTECTOMY    . COLONOSCOPY     Approximately 2003. Per medical records, internal hemorrhoids noted  . COLONOSCOPY N/A 06/25/2013   Dr. Gala Romney: Anal canal hemorrhoids-more likely the source of paper hematochezia. Redundant, capacious colon. Multiple colonic polyps-tubular adenoma  . COLONOSCOPY WITH PROPOFOL N/A 12/04/2016   Dr. Gala Romney: redundant colon, colonic diverticulosis. Screening due  in 2023  . ENDOVENOUS ABLATION SAPHENOUS VEIN W/ LASER Left 03/07/2018   endovenous ablation left greater saphenous vein by Deitra Mayo MD   . ESOPHAGOGASTRODUODENOSCOPY  10/12/2004   [facility]:MWNU plaquing on the esophageal mucosa of uncertain significance, not typical of what is seen with candida esophagitis status post KOH brushing for KOH prep and biopsy for histology. Rule out candida esophagitis/eosinophilic esophagitis. Otherwise normal esophagus. Tiny hiatal hernia. Otherwise, normal stomach, normal D1 and D2. Benign biopsy of esophagus, unknown KOH status.   . ESOPHAGOGASTRODUODENOSCOPY N/A 06/25/2013   Dr. Rourk:mild chronic gastritis  . KNEE SURGERY    . LEFT HEART CATHETERIZATION WITH CORONARY ANGIOGRAM N/A 11/15/2010   Procedure: LEFT HEART CATHETERIZATION WITH CORONARY ANGIOGRAM;  Surgeon: Laverda Page, MD;  Location: [facility] CATH LAB;  Service: Cardiovascular;  Laterality: N/A;  . LUNG BIOPSY    . RIGHT/LEFT HEART CATH AND CORONARY ANGIOGRAPHY N/A 08/28/2018   Procedure: RIGHT/LEFT HEART CATH AND CORONARY ANGIOGRAPHY;  Surgeon: Burnell Blanks, MD;  Location: [place] CV LAB;  Service: Cardiovascular;  Laterality: N/A;    Allergies  Allergen Reactions  . Levofloxacin Other (See Comments)    Unknown reaction type  . Tape Other (See Comments)    Welts, bad place on skin. Paper tape is okay  . Hyoscyamine Sulfate Other (See Comments)    TACHYCARDIA  . Metoclopramide Other (See Comments)    tremors    Social History   Socioeconomic History  . Marital status: Divorced    Spouse name: Not on file  . Number of children: Not on file  . Years of education: Not on file  . Highest education level: Not on file  Occupational History  . Not on file  Tobacco Use  . Smoking status: Former Smoker    Packs/day: 1.50    Years: 30.00    Pack years: 45.00    Types: Cigarettes    Start date: 03/06/1977    Quit date: 01/03/2007    Years since quitting: 12.9  . Smokeless  tobacco: Never Used  Vaping Use  . Vaping Use: Never used  Substance and Sexual Activity  . Alcohol use: No    Alcohol/week: 0.0 standard drinks  . Drug use: No  . Sexual activity: Not on file  Other Topics Concern  . Not on file  Social History Narrative  . Not on file   Social Determinants of Health   Financial Resource Strain:   . Difficulty of Paying Living Expenses: Not on file  Food Insecurity:   . Worried About Charity fundraiser in the Last Year: Not on file  . Ran Out of Food in the Last Year: Not on file  Transportation Needs:   . Lack of Transportation (Medical): Not on file  . Lack of Transportation (Non-Medical): Not on file  Physical Activity:   . Days of Exercise per Week: Not on file  . Minutes of Exercise per Session: Not on file  Stress:   . Feeling of Stress : Not on file  Social Connections:   . Frequency of Communication with Friends and Family: Not on file  . Frequency of Social Gatherings with Friends and Family: Not on file  . Attends Religious Services: Not on file  . Active Member of Clubs or Organizations: Not on file  . Attends Archivist Meetings: Not on file  . Marital Status: Not on file  Intimate Partner Violence:   . Fear of Current or Ex-Partner: Not on file  . Emotionally Abused: Not on file  . Physically Abused: Not on file  . Sexually Abused: Not on file    Family History  Problem Relation Age of Onset  . Coronary artery disease Father        Died age 50  . Heart attack Father   . Arrhythmia Father        AF  . Diabetes Father   . Parkinson's disease Father   . Arrhythmia Mother        AF  . Stroke Mother   . Dementia Mother   . Cancer Mother        UTERINE   . Parkinson's disease Mother   . Arrhythmia Brother        AF  . Colon cancer Maternal Grandfather   . Colon cancer Paternal Aunt   . Colon cancer Paternal Aunt   . Diabetes Son   . Cancer Sister        BREAST   . Depression Sister   . Depression  Sister   . Depression Sister      Review of Systems Shortness of breath with activity Irregular heartbeat Acid heartburn and indigestion Nasal congestion Depression Feet swelling Joint stiffness    Objective:   Physical Exam   Gen. Pleasant, obese, in no distress ENT - no lesions, no post nasal drip Neck: No JVD, no thyromegaly, no carotid bruits Lungs: no use of accessory muscles, no dullness to percussion, decreased without rales or rhonchi  Cardiovascular: Rhythm regular, heart sounds  normal, no murmurs or gallops, 2+ peripheral edema Musculoskeletal: No deformities, no cyanosis or clubbing , no tremors        Assessment & Plan:

## 2019-11-25 NOTE — Assessment & Plan Note (Signed)
We will obtain PFTs from her pulmonologist. Since Memory Dance is causing hoarseness we will change to Anoro, sample was given and she will call us for prescription if this works.  We will review prior CTs which have shown stable nodules per her report  She has oxygen to use on an as-needed basis but has not needed this in several years. We discussed signs and symptoms of exacerbation

## 2019-11-25 NOTE — Patient Instructions (Signed)
Obtain copies of CT imaging , sleep studies & PFTs from Dr [facility]  Sample of Anoro  Refills on claritin & flonase  Flu shot today

## 2019-11-25 NOTE — Assessment & Plan Note (Signed)
I reviewed BiPAP report on her phone which shows excellent compliance and good control of events with minimal leak.  She has settled down with nasal mask and we will continue this.  Weight loss encouraged, compliance with goal of at least 4-6 hrs every night is the expectation. Advised against medications with sedative side effects Cautioned against driving when sleepy - understanding that sleepiness will vary on a day to day basis

## 2019-11-30 ENCOUNTER — Other Ambulatory Visit: Payer: Self-pay | Admitting: Gastroenterology

## 2019-12-02 DIAGNOSIS — I2510 Atherosclerotic heart disease of native coronary artery without angina pectoris: Secondary | ICD-10-CM | POA: Diagnosis not present

## 2019-12-02 DIAGNOSIS — E119 Type 2 diabetes mellitus without complications: Secondary | ICD-10-CM | POA: Diagnosis not present

## 2019-12-02 DIAGNOSIS — I10 Essential (primary) hypertension: Secondary | ICD-10-CM | POA: Diagnosis not present

## 2019-12-02 DIAGNOSIS — E1165 Type 2 diabetes mellitus with hyperglycemia: Secondary | ICD-10-CM | POA: Diagnosis not present

## 2019-12-03 DIAGNOSIS — G894 Chronic pain syndrome: Secondary | ICD-10-CM | POA: Diagnosis not present

## 2019-12-03 DIAGNOSIS — M48061 Spinal stenosis, lumbar region without neurogenic claudication: Secondary | ICD-10-CM | POA: Diagnosis not present

## 2019-12-03 DIAGNOSIS — Z79891 Long term (current) use of opiate analgesic: Secondary | ICD-10-CM | POA: Diagnosis not present

## 2019-12-03 DIAGNOSIS — E1142 Type 2 diabetes mellitus with diabetic polyneuropathy: Secondary | ICD-10-CM | POA: Diagnosis not present

## 2019-12-04 ENCOUNTER — Telehealth: Payer: Self-pay | Admitting: Internal Medicine

## 2019-12-04 ENCOUNTER — Telehealth: Payer: Self-pay | Admitting: Pulmonary Disease

## 2019-12-04 MED ORDER — ANORO ELLIPTA 62.5-25 MCG/INH IN AEPB: 1.0000 | INHALATION_SPRAY | Freq: Every day | RESPIRATORY_TRACT | 6 refills | Status: DC

## 2019-12-04 NOTE — Telephone Encounter (Signed)
Wrong doc ° °

## 2019-12-04 NOTE — Telephone Encounter (Signed)
Called and spoke with pt and she stated that she used the sample of the anoro and this works way better for her than the [employer].  This rx has been sent to her pharmacy per pts request and she is aware.

## 2019-12-05 ENCOUNTER — Other Ambulatory Visit: Payer: Self-pay | Admitting: Internal Medicine

## 2019-12-05 DIAGNOSIS — Z1231 Encounter for screening mammogram for malignant neoplasm of breast: Secondary | ICD-10-CM

## 2019-12-05 DIAGNOSIS — E1142 Type 2 diabetes mellitus with diabetic polyneuropathy: Secondary | ICD-10-CM | POA: Diagnosis not present

## 2019-12-05 DIAGNOSIS — Z299 Encounter for prophylactic measures, unspecified: Secondary | ICD-10-CM | POA: Diagnosis not present

## 2019-12-05 DIAGNOSIS — I10 Essential (primary) hypertension: Secondary | ICD-10-CM | POA: Diagnosis not present

## 2019-12-05 DIAGNOSIS — E1165 Type 2 diabetes mellitus with hyperglycemia: Secondary | ICD-10-CM | POA: Diagnosis not present

## 2019-12-05 DIAGNOSIS — I4821 Permanent atrial fibrillation: Secondary | ICD-10-CM | POA: Diagnosis not present

## 2019-12-08 ENCOUNTER — Other Ambulatory Visit: Payer: Self-pay

## 2019-12-08 ENCOUNTER — Ambulatory Visit
Admission: RE | Admit: 2019-12-08 | Discharge: 2019-12-08 | Disposition: A | Discharge status: Home / Self Care | Payer: Medicare Other | Source: Ambulatory Visit | Attending: Internal Medicine | Admitting: Internal Medicine

## 2019-12-08 DIAGNOSIS — Z1231 Encounter for screening mammogram for malignant neoplasm of breast: Secondary | ICD-10-CM

## 2019-12-18 ENCOUNTER — Telehealth: Payer: Self-pay | Admitting: Pulmonary Disease

## 2019-12-18 ENCOUNTER — Encounter: Payer: Self-pay | Admitting: *Deleted

## 2019-12-18 NOTE — Telephone Encounter (Signed)
Reviewed records PFTs 02/2019 normal, no evidence of airway obstruction  CPAP titration 8/200& , 295 pounds , + 9 cm 11/2011 BiPAP titration, difficulty tolerating CPAP, IPAP 14 cm, EPAP 9  12/2011 CPAP 9 cm

## 2019-12-19 ENCOUNTER — Encounter: Payer: Self-pay | Admitting: Cardiology

## 2019-12-19 ENCOUNTER — Ambulatory Visit (INDEPENDENT_AMBULATORY_CARE_PROVIDER_SITE_OTHER): Payer: Medicare Other | Admitting: Cardiology

## 2019-12-19 VITALS — BP 120/52 | HR 70 | Resp 16 | Ht 68.0 in | Wt 338.0 lb

## 2019-12-19 DIAGNOSIS — I480 Paroxysmal atrial fibrillation: Secondary | ICD-10-CM | POA: Diagnosis not present

## 2019-12-19 DIAGNOSIS — I890 Lymphedema, not elsewhere classified: Secondary | ICD-10-CM

## 2019-12-19 NOTE — Progress Notes (Signed)
Cardiology Office Note  Date: 12/19/2019   ID: [name] [name], DOB 02/12/1960, MRN [id number]  PCP:  Glenda Chroman, MD  Cardiologist:  Rozann Lesches, MD Electrophysiologist:  None   Chief Complaint  Patient presents with  . Cardiac follow-up    History of Present Illness: [name] [name] is a 59 y.o. female last seen in June.  She presents for a routine visit.  She describes only occasional palpitations that presumably represent breakthrough atrial fibrillation, fortunately has maintained sinus rhythm the vast majority of time on current therapy.  She is now following with [facility], I reviewed the recent note from November.  She had lab work back in June, we discussed getting updated levels.  I reviewed her current medications which are outlined below.  No changes from a cardiac perspective.  Past Medical History:  Diagnosis Date  . Bladder dysfunction    Self urinary catheterization  . COPD (chronic obstructive pulmonary disease) ([place])   . DDD (degenerative disc disease)   . Depression   . Esophageal spasm    NTG and Norvasc  . Essential hypertension   . Fibromyalgia   . Gastroparesis   . GERD (gastroesophageal reflux disease)   . History of cardiac catheterization    Normal coronary arteries  . History of pituitary tumor   . Irritable bowel syndrome   . Lymphedema   . Osteoarthritis   . PAF (paroxysmal atrial fibrillation) ([place])   . Sleep apnea    BIPAP  . Type 2 diabetes mellitus ([place])   . Urinary tract infection     Past Surgical History:  Procedure Laterality Date  . ABDOMINAL HYSTERECTOMY    . ANTERIOR CERVICAL DECOMP/DISCECTOMY FUSION N/A 12/02/2014   Procedure: Cervical five cervical six anterior cervical decompression with fusion interbody prosthesis plating and bone graft;  Surgeon: Newman Pies, MD;  Location: [facility];  Service: Neurosurgery;  Laterality: N/A;  C56 anterior cervical decompression with fusion interbody prosthesis  plating and bonegraft  . APPENDECTOMY    . BACK SURGERY    . CARDIAC CATHETERIZATION    . CARDIOVERSION N/A 03/26/2014   Procedure: CARDIOVERSION;  Surgeon: Herminio Commons, MD;  Location: [facility];  Service: Endoscopy;  Laterality: N/A;  . CARDIOVERSION N/A 05/04/2014   Procedure: CARDIOVERSION;  Surgeon: Satira Sark, MD;  Location: [facility];  Service: Cardiovascular;  Laterality: N/A;  . CHOLECYSTECTOMY    . COLONOSCOPY     Approximately 2003. Per medical records, internal hemorrhoids noted  . COLONOSCOPY N/A 06/25/2013   Dr. Gala Romney: Anal canal hemorrhoids-more likely the source of paper hematochezia. Redundant, capacious colon. Multiple colonic polyps-tubular adenoma  . COLONOSCOPY WITH PROPOFOL N/A 12/04/2016   Dr. Gala Romney: redundant colon, colonic diverticulosis. Screening due in 2023  . ENDOVENOUS ABLATION SAPHENOUS VEIN W/ LASER Left 03/07/2018   endovenous ablation left greater saphenous vein by Deitra Mayo MD   . ESOPHAGOGASTRODUODENOSCOPY  10/12/2004   PHX:TAVW plaquing on the esophageal mucosa of uncertain significance, not typical of what is seen with candida esophagitis status post KOH brushing for KOH prep and biopsy for histology. Rule out candida esophagitis/eosinophilic esophagitis. Otherwise normal esophagus. Tiny hiatal hernia. Otherwise, normal stomach, normal D1 and D2. Benign biopsy of esophagus, unknown KOH status.   . ESOPHAGOGASTRODUODENOSCOPY N/A 06/25/2013   Dr. Rourk:mild chronic gastritis  . KNEE SURGERY    . LEFT HEART CATHETERIZATION WITH CORONARY ANGIOGRAM N/A 11/15/2010   Procedure: LEFT HEART CATHETERIZATION WITH CORONARY ANGIOGRAM;  Surgeon: Laverda Page,  MD;  Location: [facility];  Service: Cardiovascular;  Laterality: N/A;  . LUNG BIOPSY    . RIGHT/LEFT HEART CATH AND CORONARY ANGIOGRAPHY N/A 08/28/2018   Procedure: RIGHT/LEFT HEART CATH AND CORONARY ANGIOGRAPHY;  Surgeon: Burnell Blanks, MD;  Location: [facility];  Service:  Cardiovascular;  Laterality: N/A;    Current Outpatient Medications  Medication Sig Dispense Refill  . albuterol (PROVENTIL) (2.5 MG/3ML) 0.083% nebulizer solution Take 2.5 mg by nebulization every 6 (six) hours as needed for wheezing. For shortness of breath.    Marland Kitchen albuterol (VENTOLIN HFA) 108 (90 Base) MCG/ACT inhaler Inhale 2 puffs into the lungs every 6 (six) hours as needed for wheezing or shortness of breath.     Marland Kitchen atorvastatin (LIPITOR) 10 MG tablet Take 10 mg by mouth at bedtime.  3  . baclofen (LIORESAL) 20 MG tablet Take 20 mg by mouth 3 (three) times daily.    Marland Kitchen buPROPion (WELLBUTRIN) 75 MG tablet Take 1 tablet (75 mg total) by mouth daily. 30 tablet 3  . CONSTULOSE 10 GM/15ML solution TAKE ONE TABLESPOONFUL (15ML) BY MOUTH DAILY 473 mL 5  . dexlansoprazole (DEXILANT) 60 MG capsule Take 1 capsule (60 mg total) by mouth daily. 90 capsule 3  . diclofenac sodium (VOLTAREN) 1 % GEL Apply 2-4 g topically 4 (four) times daily as needed (for pain).     Marland Kitchen diclofenac Sodium (VOLTAREN) 1 % GEL Apply 1 application topically as directed.    . diltiazem (CARDIZEM CD) 120 MG 24 hr capsule TAKE ONE CAPSULE BY MOUTH EVERY DAY 90 capsule 3  . docusate sodium (COLACE) 100 MG capsule Take 100 mg by mouth 2 (two) times daily.    Marland Kitchen esomeprazole (NEXIUM) 40 MG capsule Take 1 capsule (40 mg total) by mouth daily before breakfast. 90 capsule 3  . famotidine (PEPCID) 20 MG tablet TAKE 1 TABLET BY MOUTH EVERY DAY AS NEEDED FOR HEARTBURN OR INDIGESTION 30 tablet 3  . flecainide (TAMBOCOR) 50 MG tablet TAKE TWO TABLETS BY MOUTH EVERY MORNING and TAKE TWO TABLETS BY MOUTH EVERY EVENING 120 tablet 6  . fluticasone (FLONASE) 50 MCG/ACT nasal spray Place 2 sprays into both nostrils daily. 18.2 mL 6  . fluticasone furoate-vilanterol (BREO ELLIPTA) 200-25 MCG/INH AEPB Inhale 1 puff into the lungs daily.    . furosemide (LASIX) 80 MG tablet Take 80 mg by mouth 2 (two) times daily.    Marland Kitchen gabapentin (NEURONTIN) 800 MG  tablet Take 800 mg by mouth 4 (four) times daily.   2  . glimepiride (AMARYL) 4 MG tablet Take 4 mg by mouth 2 (two) times daily.    . hydrocortisone (ANUSOL-HC) 2.5 % rectal cream Place 1 application rectally 2 (two) times daily. (Patient taking differently: Place 1 application rectally 2 (two) times daily as needed for hemorrhoids.) 30 g 1  . isosorbide mononitrate (IMDUR) 120 MG 24 hr tablet Take 120 mg by mouth daily. In the morning    . isosorbide mononitrate (IMDUR) 30 MG 24 hr tablet Take 30 mg by mouth every evening.    Marland Kitchen LINZESS 290 MCG CAPS capsule TAKE ONE CAPSULE BY MOUTH EVERY DAY BEFORE BREAKFAST - REPLACES MOVANTIK 30 capsule 5  . lisinopril (PRINIVIL,ZESTRIL) 10 MG tablet Take 10 mg by mouth every evening.     . loratadine (CLARITIN) 10 MG tablet Take 1 tablet (10 mg total) by mouth daily. 30 tablet 11  . meclizine (ANTIVERT) 25 MG tablet Take 25 mg by mouth 3 (three) times  daily as needed for dizziness.    . metFORMIN (GLUCOPHAGE) 500 MG tablet Take 1,000 mg by mouth 2 (two) times daily.     . metolazone (ZAROXOLYN) 2.5 MG tablet Take 2.5 mg by mouth daily with breakfast.     . MOTEGRITY 2 MG TABS TAKE 1 TABLET BY MOUTH DAILY 30 tablet 5  . Multiple Vitamin (MULTIVITAMIN WITH MINERALS) TABS tablet Take 1 tablet by mouth daily.    . nitroGLYCERIN (NITROLINGUAL) 0.4 MG/SPRAY spray USE 1 SPRAY UNDER THE TONGUE AS NEEDED FOR CHEST PAIN - PA SENT 11/28/2016- JB (Patient taking differently: Place 1 spray under the tongue every 5 (five) minutes x 3 doses as needed for chest pain.) 12 g 3  . NUCYNTA ER 100 MG 12 hr tablet Take 100 mg by mouth 2 (two) times daily.    Gean Birchwood ER 50 MG 12 hr tablet Take 50 mg by mouth 2 (two) times daily.    Marland Kitchen nystatin (MYCOSTATIN/NYSTOP) 100000 UNIT/GM POWD Apply 1 g topically daily. After bathing    . ondansetron (ZOFRAN) 4 MG tablet TAKE 1 TABLET BY MOUTH EVERY 8 HOURS AS NEEDED FOR NAUSEA AND VOMITING 30 tablet 3  . oxyCODONE-acetaminophen (PERCOCET)  10-325 MG per tablet Take 1 tablet by mouth every 6 (six) hours as needed for pain.     . polyethylene glycol powder (GLYCOLAX/MIRALAX) powder Take 17-34 g by mouth daily.    . potassium chloride SA (K-DUR,KLOR-CON) 20 MEQ tablet Take 40 mEq by mouth 5 (five) times daily.    . rivaroxaban (XARELTO) 20 MG TABS tablet Take 20 mg by mouth daily with supper.     . Semaglutide,0.25 or 0.5MG /DOS, 2 MG/1.5ML SOPN Inject 0.25 mg into the skin every Tuesday. At lunch    . spironolactone (ALDACTONE) 50 MG tablet Take 50 mg by mouth 3 (three) times daily.    Nelva Nay SOLOSTAR 300 UNIT/ML Solostar Pen Inject 46 Units into the skin at bedtime.    Marland Kitchen umeclidinium-vilanterol (ANORO ELLIPTA) 62.5-25 MCG/INH AEPB Inhale 1 puff into the lungs daily. 60 each 6  . VASCEPA 1 g CAPS Take 1 g by mouth 4 (four) times daily.     Current Facility-Administered Medications  Medication Dose Route Frequency Provider Last Rate Last Admin  . sodium chloride flush (NS) 0.9 % injection 3 mL  3 mL Intravenous Q12H Satira Sark, MD       Allergies:  Tape, Hyoscyamine sulfate, and Metoclopramide   ROS: No syncope.  Chronic back and leg pain.  Uses a cane to ambulate.  Physical Exam: VS:  BP (!) 120/52   Pulse 70   Resp 16   Ht 5\' 8"  (1.727 m)   Wt (!) 338 lb (153.3 kg)   SpO2 99%   BMI 51.39 kg/m , BMI Body mass index is 51.39 kg/m.  Wt Readings from Last 3 Encounters:  12/19/19 (!) 338 lb (153.3 kg)  11/25/19 (!) 337 lb 6.4 oz (153 kg)  09/09/19 (!) 346 lb 6.4 oz (157.1 kg)    General: Patient appears comfortable at rest. HEENT: Conjunctiva and lids normal, wearing a mask. Neck: Supple, no elevated JVP or carotid bruits, no thyromegaly. Lungs: Clear to auscultation, nonlabored breathing at rest. Cardiac: Regular rate and rhythm, no S3 or significant systolic murmur. Extremities: Lymphedema, decreased compared to last visit.  ECG:  An ECG dated 06/26/2019 was personally reviewed today and demonstrated:   Sinus rhythm with prolonged PR interval and IVCD, QRS duration 118 ms.  Recent  Labwork:  June 2021: Hemoglobin 10.4, platelets 329, BUN 18, creatinine 0.85, potassium 4.5  Other Studies Reviewed Today:  Cardiac catheterization 08/28/2018:  The left ventricular systolic function is normal.  The left ventricular ejection fraction is 55-65% by visual estimate.  1. No angiographic evidence of CAD 2. Mildly elevated left ventricular filling pressures 3. Dyspnea likely multifactorial due to COPD, obesity and physical deconditioning with chronic diastolic CHF  No further ischemic workup.  Echocardiogram 06/17/2018([name]): Overall limited study based on report. LVEF described as normal range with calculation provided at 54.8%. Valves not well visualized.  Assessment and Plan:  1.  Paroxysmal atrial fibrillation.  CHA2DS2-VASc score is 3.  She continues on Xarelto, check CBC and BMET.  No change in current doses of Cardizem CD or flecainide.  2.  Lymphedema, less pronounced compared to last visit.  She continues on Lasix and Zaroxolyn.  Medication Adjustments/Labs and Tests Ordered: Current medicines are reviewed at length with the patient today.  Concerns regarding medicines are outlined above.   Tests Ordered: No orders of the defined types were placed in this encounter.   Medication Changes: No orders of the defined types were placed in this encounter.   Disposition:  Follow up 6 months in the [place] office.  Signed, Satira Sark, MD, [facility] 12/19/2019 3:56 PM    [facility] [address], [place], [place] [address] Phone: [phone]; Fax: [phone]

## 2019-12-19 NOTE — Patient Instructions (Addendum)
Medication Instructions:   Your physician recommends that you continue on your current medications as directed. Please refer to the Current Medication list given to you today.  Labwork:  Your physician recommends that you return for lab work in: TODAY to check your BMET & CBC.   Testing/Procedures:  None  Follow-Up:  Your physician recommends that you schedule a follow-up appointment in: 6 months.  Any Other Special Instructions Will Be Listed Below (If Applicable).  If you need a refill on your cardiac medications before your next appointment, please call your pharmacy.

## 2019-12-23 ENCOUNTER — Telehealth: Payer: Self-pay | Admitting: *Deleted

## 2019-12-23 DIAGNOSIS — L97511 Non-pressure chronic ulcer of other part of right foot limited to breakdown of skin: Secondary | ICD-10-CM | POA: Diagnosis not present

## 2019-12-23 NOTE — Telephone Encounter (Signed)
-----   Message from Satira Sark, MD sent at 12/23/2019  9:46 AM EST ----- Results reviewed.  Renal function and potassium remain normal.  Anemia is stable, hemoglobin actually somewhat higher than last time, currently at 10.9.  Normal platelet count.

## 2019-12-23 NOTE — Telephone Encounter (Signed)
Patient informed. Copy sent to PCP °

## 2019-12-29 ENCOUNTER — Other Ambulatory Visit: Payer: Self-pay | Admitting: Cardiology

## 2019-12-29 NOTE — Telephone Encounter (Signed)
Rx has been sent to the pharmacy electronically. ° °

## 2019-12-31 ENCOUNTER — Other Ambulatory Visit: Payer: Self-pay | Admitting: Gastroenterology

## 2019-12-31 ENCOUNTER — Other Ambulatory Visit: Payer: Self-pay | Admitting: Cardiology

## 2020-01-01 DIAGNOSIS — I10 Essential (primary) hypertension: Secondary | ICD-10-CM | POA: Diagnosis not present

## 2020-01-01 DIAGNOSIS — I2510 Atherosclerotic heart disease of native coronary artery without angina pectoris: Secondary | ICD-10-CM | POA: Diagnosis not present

## 2020-01-01 DIAGNOSIS — E119 Type 2 diabetes mellitus without complications: Secondary | ICD-10-CM | POA: Diagnosis not present

## 2020-01-01 DIAGNOSIS — E1165 Type 2 diabetes mellitus with hyperglycemia: Secondary | ICD-10-CM | POA: Diagnosis not present

## 2020-01-06 DIAGNOSIS — G894 Chronic pain syndrome: Secondary | ICD-10-CM | POA: Diagnosis not present

## 2020-01-06 DIAGNOSIS — E1142 Type 2 diabetes mellitus with diabetic polyneuropathy: Secondary | ICD-10-CM | POA: Diagnosis not present

## 2020-01-06 DIAGNOSIS — Z79891 Long term (current) use of opiate analgesic: Secondary | ICD-10-CM | POA: Diagnosis not present

## 2020-01-06 DIAGNOSIS — M48061 Spinal stenosis, lumbar region without neurogenic claudication: Secondary | ICD-10-CM | POA: Diagnosis not present

## 2020-01-07 DIAGNOSIS — G4733 Obstructive sleep apnea (adult) (pediatric): Secondary | ICD-10-CM | POA: Diagnosis not present

## 2020-01-07 DIAGNOSIS — J449 Chronic obstructive pulmonary disease, unspecified: Secondary | ICD-10-CM | POA: Diagnosis not present

## 2020-01-08 DIAGNOSIS — L97511 Non-pressure chronic ulcer of other part of right foot limited to breakdown of skin: Secondary | ICD-10-CM | POA: Diagnosis not present

## 2020-01-08 DIAGNOSIS — R2681 Unsteadiness on feet: Secondary | ICD-10-CM | POA: Diagnosis not present

## 2020-01-12 DIAGNOSIS — M7989 Other specified soft tissue disorders: Secondary | ICD-10-CM | POA: Diagnosis not present

## 2020-01-12 DIAGNOSIS — R5383 Other fatigue: Secondary | ICD-10-CM | POA: Diagnosis not present

## 2020-01-12 DIAGNOSIS — M8580 Other specified disorders of bone density and structure, unspecified site: Secondary | ICD-10-CM | POA: Diagnosis not present

## 2020-01-12 DIAGNOSIS — M5136 Other intervertebral disc degeneration, lumbar region: Secondary | ICD-10-CM | POA: Diagnosis not present

## 2020-01-12 DIAGNOSIS — M2550 Pain in unspecified joint: Secondary | ICD-10-CM | POA: Diagnosis not present

## 2020-01-12 DIAGNOSIS — M150 Primary generalized (osteo)arthritis: Secondary | ICD-10-CM | POA: Diagnosis not present

## 2020-01-12 DIAGNOSIS — R768 Other specified abnormal immunological findings in serum: Secondary | ICD-10-CM | POA: Diagnosis not present

## 2020-01-16 DIAGNOSIS — Z299 Encounter for prophylactic measures, unspecified: Secondary | ICD-10-CM | POA: Diagnosis not present

## 2020-01-16 DIAGNOSIS — J069 Acute upper respiratory infection, unspecified: Secondary | ICD-10-CM | POA: Diagnosis not present

## 2020-01-16 DIAGNOSIS — E1142 Type 2 diabetes mellitus with diabetic polyneuropathy: Secondary | ICD-10-CM | POA: Diagnosis not present

## 2020-01-16 DIAGNOSIS — R059 Cough, unspecified: Secondary | ICD-10-CM | POA: Diagnosis not present

## 2020-01-16 DIAGNOSIS — J019 Acute sinusitis, unspecified: Secondary | ICD-10-CM | POA: Diagnosis not present

## 2020-01-21 DIAGNOSIS — U071 COVID-19: Secondary | ICD-10-CM | POA: Diagnosis not present

## 2020-01-21 DIAGNOSIS — R079 Chest pain, unspecified: Secondary | ICD-10-CM | POA: Diagnosis not present

## 2020-01-21 DIAGNOSIS — Z299 Encounter for prophylactic measures, unspecified: Secondary | ICD-10-CM | POA: Diagnosis not present

## 2020-01-21 DIAGNOSIS — I4891 Unspecified atrial fibrillation: Secondary | ICD-10-CM | POA: Diagnosis not present

## 2020-01-27 DIAGNOSIS — E1142 Type 2 diabetes mellitus with diabetic polyneuropathy: Secondary | ICD-10-CM | POA: Diagnosis not present

## 2020-01-27 DIAGNOSIS — M48061 Spinal stenosis, lumbar region without neurogenic claudication: Secondary | ICD-10-CM | POA: Diagnosis not present

## 2020-01-27 DIAGNOSIS — Z79891 Long term (current) use of opiate analgesic: Secondary | ICD-10-CM | POA: Diagnosis not present

## 2020-01-27 DIAGNOSIS — G894 Chronic pain syndrome: Secondary | ICD-10-CM | POA: Diagnosis not present

## 2020-01-29 DIAGNOSIS — L97511 Non-pressure chronic ulcer of other part of right foot limited to breakdown of skin: Secondary | ICD-10-CM | POA: Diagnosis not present

## 2020-02-02 DIAGNOSIS — L409 Psoriasis, unspecified: Secondary | ICD-10-CM | POA: Diagnosis not present

## 2020-02-02 DIAGNOSIS — M8580 Other specified disorders of bone density and structure, unspecified site: Secondary | ICD-10-CM | POA: Diagnosis not present

## 2020-02-02 DIAGNOSIS — M1990 Unspecified osteoarthritis, unspecified site: Secondary | ICD-10-CM | POA: Diagnosis not present

## 2020-02-02 DIAGNOSIS — E1165 Type 2 diabetes mellitus with hyperglycemia: Secondary | ICD-10-CM | POA: Diagnosis not present

## 2020-02-02 DIAGNOSIS — M150 Primary generalized (osteo)arthritis: Secondary | ICD-10-CM | POA: Diagnosis not present

## 2020-02-02 DIAGNOSIS — M2550 Pain in unspecified joint: Secondary | ICD-10-CM | POA: Diagnosis not present

## 2020-02-02 DIAGNOSIS — M5136 Other intervertebral disc degeneration, lumbar region: Secondary | ICD-10-CM | POA: Diagnosis not present

## 2020-02-02 DIAGNOSIS — R768 Other specified abnormal immunological findings in serum: Secondary | ICD-10-CM | POA: Diagnosis not present

## 2020-02-12 DIAGNOSIS — L97511 Non-pressure chronic ulcer of other part of right foot limited to breakdown of skin: Secondary | ICD-10-CM | POA: Diagnosis not present

## 2020-02-17 ENCOUNTER — Ambulatory Visit: Payer: Medicare Other | Admitting: Pulmonary Disease

## 2020-02-24 DIAGNOSIS — M48061 Spinal stenosis, lumbar region without neurogenic claudication: Secondary | ICD-10-CM | POA: Diagnosis not present

## 2020-02-24 DIAGNOSIS — E1142 Type 2 diabetes mellitus with diabetic polyneuropathy: Secondary | ICD-10-CM | POA: Diagnosis not present

## 2020-02-24 DIAGNOSIS — Z79891 Long term (current) use of opiate analgesic: Secondary | ICD-10-CM | POA: Diagnosis not present

## 2020-02-24 DIAGNOSIS — G894 Chronic pain syndrome: Secondary | ICD-10-CM | POA: Diagnosis not present

## 2020-03-01 DIAGNOSIS — I4891 Unspecified atrial fibrillation: Secondary | ICD-10-CM | POA: Diagnosis not present

## 2020-03-01 DIAGNOSIS — M797 Fibromyalgia: Secondary | ICD-10-CM | POA: Diagnosis not present

## 2020-03-01 DIAGNOSIS — E1165 Type 2 diabetes mellitus with hyperglycemia: Secondary | ICD-10-CM | POA: Diagnosis not present

## 2020-03-16 ENCOUNTER — Ambulatory Visit (INDEPENDENT_AMBULATORY_CARE_PROVIDER_SITE_OTHER): Payer: Medicare Other | Admitting: Pulmonary Disease

## 2020-03-16 ENCOUNTER — Other Ambulatory Visit: Payer: Self-pay

## 2020-03-16 ENCOUNTER — Encounter: Payer: Self-pay | Admitting: Pulmonary Disease

## 2020-03-16 DIAGNOSIS — G4733 Obstructive sleep apnea (adult) (pediatric): Secondary | ICD-10-CM | POA: Diagnosis not present

## 2020-03-16 DIAGNOSIS — Z299 Encounter for prophylactic measures, unspecified: Secondary | ICD-10-CM | POA: Diagnosis not present

## 2020-03-16 DIAGNOSIS — R0600 Dyspnea, unspecified: Secondary | ICD-10-CM

## 2020-03-16 DIAGNOSIS — J449 Chronic obstructive pulmonary disease, unspecified: Secondary | ICD-10-CM | POA: Diagnosis not present

## 2020-03-16 DIAGNOSIS — E1165 Type 2 diabetes mellitus with hyperglycemia: Secondary | ICD-10-CM | POA: Diagnosis not present

## 2020-03-16 DIAGNOSIS — I4821 Permanent atrial fibrillation: Secondary | ICD-10-CM | POA: Diagnosis not present

## 2020-03-16 DIAGNOSIS — E1142 Type 2 diabetes mellitus with diabetic polyneuropathy: Secondary | ICD-10-CM | POA: Diagnosis not present

## 2020-03-16 DIAGNOSIS — R0609 Other forms of dyspnea: Secondary | ICD-10-CM

## 2020-03-16 DIAGNOSIS — I10 Essential (primary) hypertension: Secondary | ICD-10-CM | POA: Diagnosis not present

## 2020-03-16 NOTE — Progress Notes (Signed)
   Subjective:    Patient ID: [name] [name], female    DOB: Sep 10, 1960, 60 y.o.   MRN: [id number]  HPI  60 year old ex-smoker  for FU of COPD and OSA.  She was following with Dr. Felton Clinton in [facility], established with [place] 11/2019 She smoked about 2 packs/day for 30 years until she quit in 2009, more than 60 pack years.  She has been diagnosed with 'COPD' and has been maintained on multiple medications over the years including Advair, Symbicort, and currently on Breo which causes her hoarseness.  She uses albuterol nebs on a as needed basis-  ProAir  works better than her current Ventolin.  Dyspnea is worse during the summer and she breathes better in the cold weather. mMRC scale is 3, she ambulates with a cane which is related to knee osteoarthritis and neck fusion and neuropathy.  OSA was diagnosed several years ago she is on her second machine which she received in 2000, settings are 14/9 BiPAP, she could not tolerate CPAP because of high pressures.   She also had pulmonary nodules which were documented to be stable on serial CTs  PMH -osteoarthritis, morbid obesity, atrial fibrillation on Xarelto, bilateral lower extremity lymphedema , rheumatoid arthritis   Changed to Anoro, last OV, she states that this is helping her breathing better than Anoro Her allergies are worse over the last 2 to 3 weeks, wondering if Claritin and Flonase have stopped working No problems with mask or pressure, we reviewed BiPAP download She had Covid infection 01/2020, she is vaccinated to this was a viral illness. She has been started on methotrexate for rheumatoid arthritis by Dr. Amil Amen and she is worried about immune suppressant effect   Significant tests/ events reviewed PFTs 02/2019 normal, no evidence of airway obstruction  CPAP titration 08/2005 , 295 pounds , + 9 cm 11/2011 BiPAP titration, difficulty tolerating CPAP, IPAP 14 cm, EPAP 9  12/2011 CPAP 9 cm    Review of Systems neg  for any significant sore throat, dysphagia, itching, sneezing, nasal congestion or excess/ purulent secretions, fever, chills, sweats, unintended wt loss, pleuritic or exertional cp, hempoptysis, orthopnea pnd or change in chronic leg swelling. Also denies presyncope, palpitations, heartburn, abdominal pain, nausea, vomiting, diarrhea or change in bowel or urinary habits, dysuria,hematuria, rash, arthralgias, visual complaints, headache, numbness weakness or ataxia.     Objective:   Physical Exam  Gen. Pleasant, obese, in no distress ENT - no lesions, no post nasal drip Neck: No JVD, no thyromegaly, no carotid bruits Lungs: no use of accessory muscles, no dullness to percussion, decreased without rales or rhonchi  Cardiovascular: Rhythm regular, heart sounds  normal, no murmurs or gallops, no peripheral edema Musculoskeletal: No deformities, no cyanosis or clubbing , no tremors, ambulates with cane       Assessment & Plan:

## 2020-03-16 NOTE — Patient Instructions (Signed)
  Take zyrtec instead of claritin Nasonex instead of flonase  BiPAP is working well

## 2020-03-16 NOTE — Assessment & Plan Note (Signed)
BiPAP download was reviewed which shows excellent control of events on BiPAP 14/9 cm with good compliance close to 6 hours every night unacceptable leak  Weight loss encouraged, compliance with goal of at least 4-6 hrs every night is the expectation. Advised against medications with sedative side effects Cautioned against driving when sleepy - understanding that sleepiness will vary on a day to day basis

## 2020-03-16 NOTE — Assessment & Plan Note (Addendum)
I was surprised to note that her PFTs are near normal and no evidence of airway obstruction. As such I explained to her that she really does not need the Anoro however she reports subjective benefit and feels that Anoro has worked better [facility], we will continue this in the short-term with the plan to discontinue it once her breathing is stabilized. I feel that dyspnea is more related to obesity and deconditioning but cannot rule out an element of asthma

## 2020-03-18 DIAGNOSIS — L97511 Non-pressure chronic ulcer of other part of right foot limited to breakdown of skin: Secondary | ICD-10-CM | POA: Diagnosis not present

## 2020-03-23 DIAGNOSIS — Z79891 Long term (current) use of opiate analgesic: Secondary | ICD-10-CM | POA: Diagnosis not present

## 2020-03-23 DIAGNOSIS — E1142 Type 2 diabetes mellitus with diabetic polyneuropathy: Secondary | ICD-10-CM | POA: Diagnosis not present

## 2020-03-23 DIAGNOSIS — G894 Chronic pain syndrome: Secondary | ICD-10-CM | POA: Diagnosis not present

## 2020-03-23 DIAGNOSIS — M48061 Spinal stenosis, lumbar region without neurogenic claudication: Secondary | ICD-10-CM | POA: Diagnosis not present

## 2020-03-25 DIAGNOSIS — R2681 Unsteadiness on feet: Secondary | ICD-10-CM | POA: Diagnosis not present

## 2020-04-01 DIAGNOSIS — E1165 Type 2 diabetes mellitus with hyperglycemia: Secondary | ICD-10-CM | POA: Diagnosis not present

## 2020-04-01 DIAGNOSIS — L97511 Non-pressure chronic ulcer of other part of right foot limited to breakdown of skin: Secondary | ICD-10-CM | POA: Diagnosis not present

## 2020-04-06 DIAGNOSIS — G4733 Obstructive sleep apnea (adult) (pediatric): Secondary | ICD-10-CM | POA: Diagnosis not present

## 2020-04-06 DIAGNOSIS — J449 Chronic obstructive pulmonary disease, unspecified: Secondary | ICD-10-CM | POA: Diagnosis not present

## 2020-04-07 DIAGNOSIS — M5136 Other intervertebral disc degeneration, lumbar region: Secondary | ICD-10-CM | POA: Diagnosis not present

## 2020-04-07 DIAGNOSIS — R768 Other specified abnormal immunological findings in serum: Secondary | ICD-10-CM | POA: Diagnosis not present

## 2020-04-07 DIAGNOSIS — M1990 Unspecified osteoarthritis, unspecified site: Secondary | ICD-10-CM | POA: Diagnosis not present

## 2020-04-07 DIAGNOSIS — M8580 Other specified disorders of bone density and structure, unspecified site: Secondary | ICD-10-CM | POA: Diagnosis not present

## 2020-04-07 DIAGNOSIS — L409 Psoriasis, unspecified: Secondary | ICD-10-CM | POA: Diagnosis not present

## 2020-04-07 DIAGNOSIS — M2550 Pain in unspecified joint: Secondary | ICD-10-CM | POA: Diagnosis not present

## 2020-04-07 DIAGNOSIS — Z79899 Other long term (current) drug therapy: Secondary | ICD-10-CM | POA: Diagnosis not present

## 2020-04-07 DIAGNOSIS — M150 Primary generalized (osteo)arthritis: Secondary | ICD-10-CM | POA: Diagnosis not present

## 2020-04-15 ENCOUNTER — Telehealth: Payer: Self-pay | Admitting: Cardiology

## 2020-04-15 DIAGNOSIS — L97511 Non-pressure chronic ulcer of other part of right foot limited to breakdown of skin: Secondary | ICD-10-CM | POA: Diagnosis not present

## 2020-04-15 MED ORDER — NITROGLYCERIN 0.4 MG/SPRAY TL SOLN: 3 refills | Status: DC

## 2020-04-15 NOTE — Telephone Encounter (Signed)
      nitroGLYCERIN (NITROLINGUAL) 0.4 MG/SPRAY spray USE 1 SPRAY UNDER THE TONGUE AS NEEDED FOR CHEST PAIN  Cathy at [facility] called said the insurance company wants to know the frequency the patient should use the spray.Please call them back with this info. [phone]

## 2020-04-15 NOTE — Telephone Encounter (Signed)
Clarified NTG dose for [name] [name].

## 2020-04-20 DIAGNOSIS — Z79891 Long term (current) use of opiate analgesic: Secondary | ICD-10-CM | POA: Diagnosis not present

## 2020-04-20 DIAGNOSIS — G894 Chronic pain syndrome: Secondary | ICD-10-CM | POA: Diagnosis not present

## 2020-04-20 DIAGNOSIS — E1142 Type 2 diabetes mellitus with diabetic polyneuropathy: Secondary | ICD-10-CM | POA: Diagnosis not present

## 2020-04-20 DIAGNOSIS — M48061 Spinal stenosis, lumbar region without neurogenic claudication: Secondary | ICD-10-CM | POA: Diagnosis not present

## 2020-04-21 DIAGNOSIS — M25511 Pain in right shoulder: Secondary | ICD-10-CM | POA: Diagnosis not present

## 2020-04-21 DIAGNOSIS — Z299 Encounter for prophylactic measures, unspecified: Secondary | ICD-10-CM | POA: Diagnosis not present

## 2020-04-21 DIAGNOSIS — I10 Essential (primary) hypertension: Secondary | ICD-10-CM | POA: Diagnosis not present

## 2020-04-28 ENCOUNTER — Other Ambulatory Visit: Payer: Self-pay | Admitting: Gastroenterology

## 2020-04-29 DIAGNOSIS — L97511 Non-pressure chronic ulcer of other part of right foot limited to breakdown of skin: Secondary | ICD-10-CM | POA: Diagnosis not present

## 2020-04-30 DIAGNOSIS — M170 Bilateral primary osteoarthritis of knee: Secondary | ICD-10-CM | POA: Diagnosis not present

## 2020-04-30 DIAGNOSIS — M25511 Pain in right shoulder: Secondary | ICD-10-CM | POA: Diagnosis not present

## 2020-05-01 DIAGNOSIS — M797 Fibromyalgia: Secondary | ICD-10-CM | POA: Diagnosis not present

## 2020-05-01 DIAGNOSIS — E1165 Type 2 diabetes mellitus with hyperglycemia: Secondary | ICD-10-CM | POA: Diagnosis not present

## 2020-05-01 DIAGNOSIS — I4891 Unspecified atrial fibrillation: Secondary | ICD-10-CM | POA: Diagnosis not present

## 2020-05-03 ENCOUNTER — Other Ambulatory Visit: Payer: Self-pay | Admitting: Orthopedic Surgery

## 2020-05-03 DIAGNOSIS — M75101 Unspecified rotator cuff tear or rupture of right shoulder, not specified as traumatic: Secondary | ICD-10-CM

## 2020-05-05 DIAGNOSIS — R2681 Unsteadiness on feet: Secondary | ICD-10-CM | POA: Diagnosis not present

## 2020-05-05 DIAGNOSIS — I87313 Chronic venous hypertension (idiopathic) with ulcer of bilateral lower extremity: Secondary | ICD-10-CM | POA: Diagnosis not present

## 2020-05-06 DIAGNOSIS — R2681 Unsteadiness on feet: Secondary | ICD-10-CM | POA: Diagnosis not present

## 2020-05-17 DIAGNOSIS — Z299 Encounter for prophylactic measures, unspecified: Secondary | ICD-10-CM | POA: Diagnosis not present

## 2020-05-17 DIAGNOSIS — J069 Acute upper respiratory infection, unspecified: Secondary | ICD-10-CM | POA: Diagnosis not present

## 2020-05-17 DIAGNOSIS — I4891 Unspecified atrial fibrillation: Secondary | ICD-10-CM | POA: Diagnosis not present

## 2020-05-17 DIAGNOSIS — I10 Essential (primary) hypertension: Secondary | ICD-10-CM | POA: Diagnosis not present

## 2020-05-17 DIAGNOSIS — D6869 Other thrombophilia: Secondary | ICD-10-CM | POA: Diagnosis not present

## 2020-05-18 DIAGNOSIS — G894 Chronic pain syndrome: Secondary | ICD-10-CM | POA: Diagnosis not present

## 2020-05-18 DIAGNOSIS — E1142 Type 2 diabetes mellitus with diabetic polyneuropathy: Secondary | ICD-10-CM | POA: Diagnosis not present

## 2020-05-18 DIAGNOSIS — Z79891 Long term (current) use of opiate analgesic: Secondary | ICD-10-CM | POA: Diagnosis not present

## 2020-05-18 DIAGNOSIS — M48061 Spinal stenosis, lumbar region without neurogenic claudication: Secondary | ICD-10-CM | POA: Diagnosis not present

## 2020-05-20 DIAGNOSIS — L97511 Non-pressure chronic ulcer of other part of right foot limited to breakdown of skin: Secondary | ICD-10-CM | POA: Diagnosis not present

## 2020-05-21 ENCOUNTER — Ambulatory Visit
Admission: RE | Admit: 2020-05-21 | Discharge: 2020-05-21 | Disposition: A | Discharge status: Home / Self Care | Payer: Medicare Other | Source: Ambulatory Visit | Attending: Orthopedic Surgery | Admitting: Orthopedic Surgery

## 2020-05-21 DIAGNOSIS — I87313 Chronic venous hypertension (idiopathic) with ulcer of bilateral lower extremity: Secondary | ICD-10-CM | POA: Diagnosis not present

## 2020-05-21 DIAGNOSIS — M25511 Pain in right shoulder: Secondary | ICD-10-CM | POA: Diagnosis not present

## 2020-05-21 DIAGNOSIS — M75101 Unspecified rotator cuff tear or rupture of right shoulder, not specified as traumatic: Secondary | ICD-10-CM

## 2020-05-27 ENCOUNTER — Other Ambulatory Visit: Payer: Self-pay | Admitting: Nurse Practitioner

## 2020-05-28 DIAGNOSIS — M170 Bilateral primary osteoarthritis of knee: Secondary | ICD-10-CM | POA: Diagnosis not present

## 2020-05-28 DIAGNOSIS — M25511 Pain in right shoulder: Secondary | ICD-10-CM | POA: Diagnosis not present

## 2020-05-31 DIAGNOSIS — E1165 Type 2 diabetes mellitus with hyperglycemia: Secondary | ICD-10-CM | POA: Diagnosis not present

## 2020-05-31 DIAGNOSIS — M797 Fibromyalgia: Secondary | ICD-10-CM | POA: Diagnosis not present

## 2020-05-31 DIAGNOSIS — I4891 Unspecified atrial fibrillation: Secondary | ICD-10-CM | POA: Diagnosis not present

## 2020-06-01 DIAGNOSIS — E1165 Type 2 diabetes mellitus with hyperglycemia: Secondary | ICD-10-CM | POA: Diagnosis not present

## 2020-06-10 DIAGNOSIS — E119 Type 2 diabetes mellitus without complications: Secondary | ICD-10-CM | POA: Diagnosis not present

## 2020-06-10 DIAGNOSIS — L97511 Non-pressure chronic ulcer of other part of right foot limited to breakdown of skin: Secondary | ICD-10-CM | POA: Diagnosis not present

## 2020-06-11 DIAGNOSIS — M25511 Pain in right shoulder: Secondary | ICD-10-CM | POA: Diagnosis not present

## 2020-06-11 DIAGNOSIS — M170 Bilateral primary osteoarthritis of knee: Secondary | ICD-10-CM | POA: Diagnosis not present

## 2020-06-15 ENCOUNTER — Telehealth: Payer: Self-pay | Admitting: Pulmonary Disease

## 2020-06-15 ENCOUNTER — Telehealth: Payer: Self-pay | Admitting: *Deleted

## 2020-06-15 NOTE — Telephone Encounter (Signed)
Patient with diagnosis of atrial fibrillation on Xarelto for anticoagulation.    Procedure: right shoulder scope rotator cuff repair Date of procedure: TBD   CHA2DS2-VASc Score = 3  This indicates a 3.2% annual risk of stroke. The patient's score is based upon: CHF History: No HTN History: Yes Diabetes History: Yes Stroke History: No Vascular Disease History: No Age Score: 0 Gender Score: 1    CrCl 126.6 (with adjusted body weight) Platelet count 335  Per office protocol, patient can hold Xarelto for 2 days prior to procedure.   Patient will not need bridging with Lovenox (enoxaparin) around procedure.  For orthopedic procedures please be sure to resume therapeutic (not prophylactic) dosing.

## 2020-06-15 NOTE — Telephone Encounter (Addendum)
Surgical clearance received from [facility]. Pt to be scheduled for Right Shoulder Scope Rotator Cuff Repair and is needing to have pulmonary clearance. Surgery to be performed by Dr. Cleda Mccreedy.  Attempted to call pt to get her scheduled for a surgical clearance appt with APP or Dr. Elsworth Soho but unable to reach. Left message for her to return call.  Routing to front desk pool so they can help try to get pt scheduled.

## 2020-06-15 NOTE — Telephone Encounter (Signed)
   [facility] Pre-operative Risk Assessment    Patient Name: [name] [name]  DOB: 1960-01-05  MRN: [id number]   HEARTCARE STAFF: - Please ensure there is not already an duplicate clearance open for this procedure. - Under Visit Info/Reason for Call, type in Other and utilize the format Clearance MM/DD/YY or Clearance TBD. Do not use dashes or single digits. - If request is for dental extraction, please clarify the # of teeth to be extracted. - If the patient is currently at the dentist's office, call Pre-Op APP to address. If the patient is not currently in the dentist office, please route to the Pre-Op pool  Request for surgical clearance:  What type of surgery is being performed? Right Shoulder Scope Rotator Cuff Repair  When is this surgery scheduled? TBD  What type of clearance is required (medical clearance vs. Pharmacy clearance to hold med vs. Both)? Both  Are there any medications that need to be held prior to surgery and how long? Xarelto (specify duration)  Practice name and name of physician performing surgery? [facility]  What is the office phone number? [facility] [address])   7.   What is the office fax number? [phone] [address])  8.   Anesthesia type (None, local, MAC, general) ? general   Marlou Sa 06/15/2020, 3:10 PM  _________________________________________________________________   (provider comments below)

## 2020-06-16 DIAGNOSIS — G894 Chronic pain syndrome: Secondary | ICD-10-CM | POA: Diagnosis not present

## 2020-06-16 DIAGNOSIS — M48061 Spinal stenosis, lumbar region without neurogenic claudication: Secondary | ICD-10-CM | POA: Diagnosis not present

## 2020-06-16 DIAGNOSIS — Z79891 Long term (current) use of opiate analgesic: Secondary | ICD-10-CM | POA: Diagnosis not present

## 2020-06-16 DIAGNOSIS — E1142 Type 2 diabetes mellitus with diabetic polyneuropathy: Secondary | ICD-10-CM | POA: Diagnosis not present

## 2020-06-16 NOTE — Telephone Encounter (Signed)
LVM 6/15

## 2020-06-22 DIAGNOSIS — L97511 Non-pressure chronic ulcer of other part of right foot limited to breakdown of skin: Secondary | ICD-10-CM | POA: Diagnosis not present

## 2020-06-23 ENCOUNTER — Other Ambulatory Visit: Payer: Self-pay | Admitting: Internal Medicine

## 2020-06-23 NOTE — Telephone Encounter (Signed)
   Name: [name] [name]  DOB: 08-15-1960  MRN: [id number]   Primary Cardiologist: Rozann Lesches, MD  Chart reviewed as part of pre-operative protocol coverage. Patient was contacted 06/23/2020 in reference to pre-operative risk assessment for pending surgery as outlined below.  [name] [name] was last seen on 12/19/19 by Dr. Domenic Polite.  Since that day, [name] [name] has done well and states she is at her baseline. She is limited by COPD, back, and knee pain. She is unable to complete more than 4.0 METS, but does not have coronary artery disease. She denies symptoms of hypervolemia or acute CHF exacerbation. Upon further investigation, she has an appt with Dr. Domenic Polite on 07/14/20 and prefers to complete clearance at that appt and I agree with this.   She will need clearance for tooth extractions and shoulder surgery.  She will also need guidance for xarelto hold for shoulder surgery and dental procedures (although she states she is only having 2 teeth extracted and should not need xarelto hold).    I will remove from pre-op pool.  Tami Lin Annetta Deiss, PA 06/23/2020, 8:59 AM

## 2020-06-24 NOTE — Telephone Encounter (Signed)
Pt has been scheduled for a surgical clearance visit with Dr. Elsworth Soho 7/27. Closing encounter.

## 2020-06-28 DIAGNOSIS — I10 Essential (primary) hypertension: Secondary | ICD-10-CM | POA: Diagnosis not present

## 2020-06-28 DIAGNOSIS — Z299 Encounter for prophylactic measures, unspecified: Secondary | ICD-10-CM | POA: Diagnosis not present

## 2020-06-28 DIAGNOSIS — D6869 Other thrombophilia: Secondary | ICD-10-CM | POA: Diagnosis not present

## 2020-06-28 DIAGNOSIS — I5032 Chronic diastolic (congestive) heart failure: Secondary | ICD-10-CM | POA: Diagnosis not present

## 2020-06-28 DIAGNOSIS — E1165 Type 2 diabetes mellitus with hyperglycemia: Secondary | ICD-10-CM | POA: Diagnosis not present

## 2020-07-01 DIAGNOSIS — I4891 Unspecified atrial fibrillation: Secondary | ICD-10-CM | POA: Diagnosis not present

## 2020-07-01 DIAGNOSIS — E1165 Type 2 diabetes mellitus with hyperglycemia: Secondary | ICD-10-CM | POA: Diagnosis not present

## 2020-07-01 DIAGNOSIS — M797 Fibromyalgia: Secondary | ICD-10-CM | POA: Diagnosis not present

## 2020-07-06 DIAGNOSIS — G4733 Obstructive sleep apnea (adult) (pediatric): Secondary | ICD-10-CM | POA: Diagnosis not present

## 2020-07-06 DIAGNOSIS — J449 Chronic obstructive pulmonary disease, unspecified: Secondary | ICD-10-CM | POA: Diagnosis not present

## 2020-07-08 DIAGNOSIS — L97511 Non-pressure chronic ulcer of other part of right foot limited to breakdown of skin: Secondary | ICD-10-CM | POA: Diagnosis not present

## 2020-07-12 DIAGNOSIS — R339 Retention of urine, unspecified: Secondary | ICD-10-CM | POA: Diagnosis not present

## 2020-07-13 DIAGNOSIS — G894 Chronic pain syndrome: Secondary | ICD-10-CM | POA: Diagnosis not present

## 2020-07-13 DIAGNOSIS — Z79891 Long term (current) use of opiate analgesic: Secondary | ICD-10-CM | POA: Diagnosis not present

## 2020-07-13 DIAGNOSIS — E1142 Type 2 diabetes mellitus with diabetic polyneuropathy: Secondary | ICD-10-CM | POA: Diagnosis not present

## 2020-07-14 ENCOUNTER — Encounter: Payer: Self-pay | Admitting: Cardiology

## 2020-07-14 ENCOUNTER — Ambulatory Visit (INDEPENDENT_AMBULATORY_CARE_PROVIDER_SITE_OTHER): Payer: Medicare Other | Admitting: Cardiology

## 2020-07-14 VITALS — BP 130/68 | HR 70 | Ht 67.0 in | Wt 341.0 lb

## 2020-07-14 DIAGNOSIS — I890 Lymphedema, not elsewhere classified: Secondary | ICD-10-CM | POA: Diagnosis not present

## 2020-07-14 DIAGNOSIS — Z01810 Encounter for preprocedural cardiovascular examination: Secondary | ICD-10-CM

## 2020-07-14 DIAGNOSIS — I480 Paroxysmal atrial fibrillation: Secondary | ICD-10-CM

## 2020-07-14 NOTE — Progress Notes (Signed)
Cardiology Office Note  Date: 07/14/2020   ID: [name] [name], DOB 1960/05/14, MRN [id number]  PCP:  Glenda Chroman, MD  Cardiologist:  Rozann Lesches, MD Electrophysiologist:  None   Chief Complaint  Patient presents with   Cardiac follow-up    History of Present Illness: [name] [name] is a 60 y.o. female last seen in December 2021.  She is here for a routine follow-up visit.  At this time she is in the process of arranging 2 separate procedures.  The first is oral surgery with the provider at [facility].  The second is right rotator cuff repair under general anesthesia by Dr. French Ana.  Neither of these has actually been scheduled as yet.  She continues to have good rhythm control on flecainide and Cardizem CD and remains on Xarelto for stroke prophylaxis.  She does not report any spontaneous bleeding problems and has lab work pending later this month with Dr. Woody Seller.  I personally reviewed her ECG today which shows sinus rhythm with prolonged PR interval, leftward axis, and QRS duration 130 ms.  She has a history of reassuring cardiac catheterization in 2020 as noted below, no angina symptoms.  RCRI cardiac risk calculator indicates class II, 0.9% chance of major adverse cardiac event.  Past Medical History:  Diagnosis Date   Bladder dysfunction    Self urinary catheterization   COPD (chronic obstructive pulmonary disease) (HCC)    DDD (degenerative disc disease)    Depression    Esophageal spasm    NTG and Norvasc   Essential hypertension    Fibromyalgia    Gastroparesis    GERD (gastroesophageal reflux disease)    History of cardiac catheterization    Normal coronary arteries   History of pituitary tumor    Irritable bowel syndrome    Lymphedema    Osteoarthritis    PAF (paroxysmal atrial fibrillation) (HCC)    Sleep apnea    BIPAP   Type 2 diabetes mellitus ([place])    Urinary tract infection     Past Surgical History:  Procedure Laterality Date    ABDOMINAL HYSTERECTOMY     ANTERIOR CERVICAL DECOMP/DISCECTOMY FUSION N/A 12/02/2014   Procedure: Cervical five cervical six anterior cervical decompression with fusion interbody prosthesis plating and bone graft;  Surgeon: Newman Pies, MD;  Location: [facility];  Service: Neurosurgery;  Laterality: N/A;  C56 anterior cervical decompression with fusion interbody prosthesis plating and bonegraft   APPENDECTOMY     BACK SURGERY     CARDIAC CATHETERIZATION     CARDIOVERSION N/A 03/26/2014   Procedure: CARDIOVERSION;  Surgeon: Herminio Commons, MD;  Location: [facility];  Service: Endoscopy;  Laterality: N/A;   CARDIOVERSION N/A 05/04/2014   Procedure: CARDIOVERSION;  Surgeon: Satira Sark, MD;  Location: [facility];  Service: Cardiovascular;  Laterality: N/A;   CHOLECYSTECTOMY     COLONOSCOPY     Approximately 2003. Per medical records, internal hemorrhoids noted   COLONOSCOPY N/A 06/25/2013   Dr. Gala Romney: Anal canal hemorrhoids-more likely the source of paper hematochezia. Redundant, capacious colon. Multiple colonic polyps-tubular adenoma   COLONOSCOPY WITH PROPOFOL N/A 12/04/2016   Dr. Gala Romney: redundant colon, colonic diverticulosis. Screening due in 2023   ENDOVENOUS ABLATION SAPHENOUS VEIN W/ LASER Left 03/07/2018   endovenous ablation left greater saphenous vein by Deitra Mayo MD    ESOPHAGOGASTRODUODENOSCOPY  10/12/2004   FAO:ZHYQ plaquing on the esophageal mucosa of uncertain significance, not typical of what is seen with candida esophagitis  status post KOH brushing for KOH prep and biopsy for histology. Rule out candida esophagitis/eosinophilic esophagitis. Otherwise normal esophagus. Tiny hiatal hernia. Otherwise, normal stomach, normal D1 and D2. Benign biopsy of esophagus, unknown KOH status.    ESOPHAGOGASTRODUODENOSCOPY N/A 06/25/2013   Dr. Rourk:mild chronic gastritis   KNEE SURGERY     LEFT HEART CATHETERIZATION WITH CORONARY ANGIOGRAM N/A 11/15/2010   Procedure: LEFT HEART  CATHETERIZATION WITH CORONARY ANGIOGRAM;  Surgeon: Laverda Page, MD;  Location: [facility] CATH LAB;  Service: Cardiovascular;  Laterality: N/A;   LUNG BIOPSY     RIGHT/LEFT HEART CATH AND CORONARY ANGIOGRAPHY N/A 08/28/2018   Procedure: RIGHT/LEFT HEART CATH AND CORONARY ANGIOGRAPHY;  Surgeon: Burnell Blanks, MD;  Location: [facility] CV LAB;  Service: Cardiovascular;  Laterality: N/A;    Current Outpatient Medications  Medication Sig Dispense Refill   albuterol (PROVENTIL) (2.5 MG/3ML) 0.083% nebulizer solution Take 2.5 mg by nebulization every 6 (six) hours as needed for wheezing. For shortness of breath.     albuterol (VENTOLIN HFA) 108 (90 Base) MCG/ACT inhaler Inhale 2 puffs into the lungs every 6 (six) hours as needed for wheezing or shortness of breath.      atorvastatin (LIPITOR) 20 MG tablet Take 20 mg by mouth daily.     baclofen (LIORESAL) 20 MG tablet Take 20 mg by mouth 3 (three) times daily.     buPROPion (WELLBUTRIN) 75 MG tablet Take 1 tablet (75 mg total) by mouth daily. 30 tablet 3   CONSTULOSE 10 GM/15ML solution TAKE ONE TABLESPOONFUL (15ML) BY MOUTH DAILY 473 mL 5   DEXILANT 60 MG capsule TAKE ONE CAPSULE BY MOUTH EVERY DAY 90 capsule 3   diclofenac sodium (VOLTAREN) 1 % GEL Apply 2-4 g topically 4 (four) times daily as needed (for pain).      diclofenac Sodium (VOLTAREN) 1 % GEL SMARTSIG:2-4 Gram(s) Topical 4 Times Daily     diltiazem (CARDIZEM CD) 120 MG 24 hr capsule TAKE ONE CAPSULE BY MOUTH EVERY DAY 90 capsule 3   docusate sodium (COLACE) 100 MG capsule Take 100 mg by mouth 2 (two) times daily.     esomeprazole (NEXIUM) 40 MG capsule Take 1 capsule (40 mg total) by mouth daily before breakfast. 90 capsule 3   famotidine (PEPCID) 20 MG tablet TAKE 1 TABLET BY MOUTH EVERY DAY AS NEEDED FOR HEARTBURN OR INDIGESTION 30 tablet 3   flecainide (TAMBOCOR) 50 MG tablet TAKE TWO TABLETS BY MOUTH EVERY MORNING and TAKE TWO TABLETS BY MOUTH EVERY EVENING 120 tablet 6    fluticasone (FLONASE) 50 MCG/ACT nasal spray Place 2 sprays into both nostrils daily. 28.3 mL 6   folic acid (FOLVITE) 1 MG tablet Take 1 mg by mouth daily.     furosemide (LASIX) 80 MG tablet Take 80 mg by mouth 2 (two) times daily.     gabapentin (NEURONTIN) 800 MG tablet Take 800 mg by mouth 4 (four) times daily.   2   glimepiride (AMARYL) 4 MG tablet Take 4 mg by mouth 2 (two) times daily.     hydrocortisone (ANUSOL-HC) 2.5 % rectal cream Place 1 application rectally 2 (two) times daily. (Patient taking differently: Place 1 application rectally 2 (two) times daily as needed for hemorrhoids.) 30 g 1   isosorbide mononitrate (IMDUR) 120 MG 24 hr tablet Take 120 mg by mouth daily. In the morning     isosorbide mononitrate (IMDUR) 30 MG 24 hr tablet Take 30 mg by mouth every evening.  LINZESS 290 MCG CAPS capsule TAKE ONE CAPSULE BY MOUTH EVERY DAY BEFORE BREAKFAST - REPLACES MOVANTIK 30 capsule 5   lisinopril (PRINIVIL,ZESTRIL) 10 MG tablet Take 10 mg by mouth every evening.      loratadine (CLARITIN) 10 MG tablet Take 1 tablet (10 mg total) by mouth daily. 30 tablet 11   meclizine (ANTIVERT) 25 MG tablet Take 25 mg by mouth 3 (three) times daily as needed for dizziness.     metFORMIN (GLUCOPHAGE) 500 MG tablet Take 1,000 mg by mouth 2 (two) times daily.      methotrexate (RHEUMATREX) 2.5 MG tablet Take 2.5 mg by mouth once a week. Caution:Chemotherapy. Protect from light.  Takes 6 pills every Thursday     metolazone (ZAROXOLYN) 2.5 MG tablet Take 2.5 mg by mouth daily with breakfast. Takes 2 tablets     MOTEGRITY 2 MG TABS TAKE 1 TABLET BY MOUTH DAILY 30 tablet 5   Multiple Vitamin (MULTIVITAMIN WITH MINERALS) TABS tablet Take 1 tablet by mouth daily.     nitroGLYCERIN (NITROLINGUAL) 0.4 MG/SPRAY spray USE 1 SPRAY UNDER THE TONGUE AS NEEDED FOR CHEST PAIN, May use for a total of three times,5 minutes apart 4.9 g 3   NUCYNTA ER 100 MG 12 hr tablet Take 100 mg by mouth 2 (two) times daily.      nystatin (MYCOSTATIN/NYSTOP) 100000 UNIT/GM POWD Apply 1 g topically daily. After bathing     ondansetron (ZOFRAN) 4 MG tablet TAKE 1 TABLET BY MOUTH EVERY 8 HOURS AS NEEDED FOR NAUSEA AND VOMITING 30 tablet 3   oxyCODONE-acetaminophen (PERCOCET) 10-325 MG per tablet Take 1 tablet by mouth every 6 (six) hours as needed for pain.      polyethylene glycol powder (GLYCOLAX/MIRALAX) powder Take 17-34 g by mouth daily.     potassium chloride SA (K-DUR,KLOR-CON) 20 MEQ tablet Take 40 mEq by mouth 5 (five) times daily.     rivaroxaban (XARELTO) 20 MG TABS tablet Take 20 mg by mouth daily with supper.      Semaglutide,0.25 or 0.5MG /DOS, 2 MG/1.5ML SOPN Inject 0.25 mg into the skin every Tuesday. At lunch     spironolactone (ALDACTONE) 50 MG tablet Take 50 mg by mouth 3 (three) times daily.     TOUJEO SOLOSTAR 300 UNIT/ML Solostar Pen Inject 46 Units into the skin at bedtime.     umeclidinium-vilanterol (ANORO ELLIPTA) 62.5-25 MCG/INH AEPB INHALE 1 PUFF BY MOUTH EVERY DAY 60 each 5   VASCEPA 1 g CAPS Take 1 g by mouth 4 (four) times daily.     Current Facility-Administered Medications  Medication Dose Route Frequency Provider Last Rate Last Admin   sodium chloride flush (NS) 0.9 % injection 3 mL  3 mL Intravenous Q12H Satira Sark, MD       Allergies:  Tape, Hyoscyamine sulfate, and Metoclopramide   ROS: No syncope.  Physical Exam: VS:  BP 130/68   Pulse 70   Ht 5\' 7"  (1.702 m)   Wt (!) 341 lb (154.7 kg)   SpO2 96%   BMI 53.41 kg/m , BMI Body mass index is 53.41 kg/m.  Wt Readings from Last 3 Encounters:  07/14/20 (!) 341 lb (154.7 kg)  03/16/20 (!) 336 lb 3.2 oz (152.5 kg)  12/19/19 (!) 338 lb (153.3 kg)    General: Patient appears comfortable at rest. HEENT: Conjunctiva and lids normal, wearing a mask. Neck: Supple, no elevated JVP or carotid bruits, no thyromegaly. Lungs: Clear to auscultation, nonlabored breathing at rest. Cardiac: Regular  rate and rhythm, no S3 or  significant systolic murmur, no pericardial rub. Extremities: Lymphedema with compression stockings in place.  ECG:  An ECG dated 06/26/2019 was personally reviewed today and demonstrated:  Sinus rhythm with prolonged PR interval and IVCD, QRS duration 118 ms.  Recent Labwork:  July 2021: Cholesterol 114, triglycerides 150, HDL 29, LDL 59 December 2021: Hemoglobin 10.9, platelets 335, potassium 4.2, BUN 12, creatinine 0.74  Other Studies Reviewed Today:  Cardiac catheterization 08/28/2018: The left ventricular systolic function is normal. The left ventricular ejection fraction is 55-65% by visual estimate.   1. No angiographic evidence of CAD 2. Mildly elevated left ventricular filling pressures 3. Dyspnea likely multifactorial due to COPD, obesity and physical deconditioning with chronic diastolic CHF   No further ischemic workup.    Echocardiogram 06/17/2018 [facility]): Overall limited study based on report.  LVEF described as normal range with calculation provided at 54.8%.  Valves not well visualized.  Assessment and Plan:  1.  Preoperative cardiac evaluation prior to planned dental work including extractions and also right rotator cuff surgery as discussed above.  Neither procedure has been scheduled as yet.  RCRI cardiac risk calculator indicates class II, 0.4% chance of major adverse cardiac event.  Main issue is holding Xarelto 48 hours prior to both procedures to reduce bleeding risk.  She does not require any further ischemic work-up based on reassuring cardiac catheterization in 2020.  ECG reviewed and stable, she is in sinus rhythm and has had good rhythm control.  Would continue Cardizem CD and flecainide throughout.  2.  Lymphedema, continues on Lasix and Zaroxolyn with use of compression stockings.  3.  OSA on BiPAP.  4.  Paroxysmal atrial fibrillation with CHA2DS2-VASc score of 3.  Medication Adjustments/Labs and Tests Ordered: Current medicines are  reviewed at length with the patient today.  Concerns regarding medicines are outlined above.   Tests Ordered: Orders Placed This Encounter  Procedures   EKG 12-Lead     Medication Changes: No orders of the defined types were placed in this encounter.   Disposition:  Follow up  6 months.  Signed, Satira Sark, MD, [facility] 07/14/2020 3:59 PM    [facility] [address], [place], [place] [address] Phone: [phone]; Fax: [phone]

## 2020-07-14 NOTE — Patient Instructions (Addendum)

## 2020-07-19 DIAGNOSIS — I4891 Unspecified atrial fibrillation: Secondary | ICD-10-CM | POA: Diagnosis not present

## 2020-07-19 DIAGNOSIS — L0390 Cellulitis, unspecified: Secondary | ICD-10-CM | POA: Diagnosis not present

## 2020-07-19 DIAGNOSIS — Z299 Encounter for prophylactic measures, unspecified: Secondary | ICD-10-CM | POA: Diagnosis not present

## 2020-07-19 DIAGNOSIS — I10 Essential (primary) hypertension: Secondary | ICD-10-CM | POA: Diagnosis not present

## 2020-07-19 DIAGNOSIS — D6869 Other thrombophilia: Secondary | ICD-10-CM | POA: Diagnosis not present

## 2020-07-20 DIAGNOSIS — L4050 Arthropathic psoriasis, unspecified: Secondary | ICD-10-CM | POA: Diagnosis not present

## 2020-07-20 DIAGNOSIS — L03116 Cellulitis of left lower limb: Secondary | ICD-10-CM | POA: Diagnosis not present

## 2020-07-20 DIAGNOSIS — M8580 Other specified disorders of bone density and structure, unspecified site: Secondary | ICD-10-CM | POA: Diagnosis not present

## 2020-07-20 DIAGNOSIS — L409 Psoriasis, unspecified: Secondary | ICD-10-CM | POA: Diagnosis not present

## 2020-07-20 DIAGNOSIS — M0579 Rheumatoid arthritis with rheumatoid factor of multiple sites without organ or systems involvement: Secondary | ICD-10-CM | POA: Diagnosis not present

## 2020-07-20 DIAGNOSIS — M5136 Other intervertebral disc degeneration, lumbar region: Secondary | ICD-10-CM | POA: Diagnosis not present

## 2020-07-20 DIAGNOSIS — M2550 Pain in unspecified joint: Secondary | ICD-10-CM | POA: Diagnosis not present

## 2020-07-20 DIAGNOSIS — M150 Primary generalized (osteo)arthritis: Secondary | ICD-10-CM | POA: Diagnosis not present

## 2020-07-20 DIAGNOSIS — R768 Other specified abnormal immunological findings in serum: Secondary | ICD-10-CM | POA: Diagnosis not present

## 2020-07-23 DIAGNOSIS — Z299 Encounter for prophylactic measures, unspecified: Secondary | ICD-10-CM | POA: Diagnosis not present

## 2020-07-23 DIAGNOSIS — L03116 Cellulitis of left lower limb: Secondary | ICD-10-CM | POA: Diagnosis not present

## 2020-07-23 DIAGNOSIS — I5032 Chronic diastolic (congestive) heart failure: Secondary | ICD-10-CM | POA: Diagnosis not present

## 2020-07-23 DIAGNOSIS — I10 Essential (primary) hypertension: Secondary | ICD-10-CM | POA: Diagnosis not present

## 2020-07-26 ENCOUNTER — Other Ambulatory Visit: Payer: Self-pay | Admitting: Cardiology

## 2020-07-26 DIAGNOSIS — R21 Rash and other nonspecific skin eruption: Secondary | ICD-10-CM | POA: Diagnosis not present

## 2020-07-26 DIAGNOSIS — L0390 Cellulitis, unspecified: Secondary | ICD-10-CM | POA: Diagnosis not present

## 2020-07-26 DIAGNOSIS — D6869 Other thrombophilia: Secondary | ICD-10-CM | POA: Diagnosis not present

## 2020-07-26 DIAGNOSIS — Z299 Encounter for prophylactic measures, unspecified: Secondary | ICD-10-CM | POA: Diagnosis not present

## 2020-07-26 DIAGNOSIS — I10 Essential (primary) hypertension: Secondary | ICD-10-CM | POA: Diagnosis not present

## 2020-07-28 ENCOUNTER — Encounter: Payer: Self-pay | Admitting: Pulmonary Disease

## 2020-07-28 ENCOUNTER — Other Ambulatory Visit: Payer: Self-pay

## 2020-07-28 ENCOUNTER — Ambulatory Visit (INDEPENDENT_AMBULATORY_CARE_PROVIDER_SITE_OTHER): Payer: Medicare Other | Admitting: Pulmonary Disease

## 2020-07-28 DIAGNOSIS — G4733 Obstructive sleep apnea (adult) (pediatric): Secondary | ICD-10-CM

## 2020-07-28 DIAGNOSIS — J449 Chronic obstructive pulmonary disease, unspecified: Secondary | ICD-10-CM | POA: Diagnosis not present

## 2020-07-28 NOTE — Assessment & Plan Note (Signed)
PFTs have not shown any evidence of airway obstruction but I have continued Anoro and albuterol based on patient's subjective improvement with these medications  She is cleared for surgery from pulmonary standpoint with due precautions

## 2020-07-28 NOTE — Progress Notes (Signed)
   Subjective:    Patient ID: [name] [name], female    DOB: 1960/02/25, 60 y.o.   MRN: [id number]  HPI  60 yo ex-smoker  for FU of COPD and OSA.  PMH -osteoarthritis, morbid obesity, atrial fibrillation on Xarelto, bilateral lower extremity lymphedema , rheumatoid arthritis on methotrexate, covid infection jan 2022  Chief Complaint  Patient presents with   Follow-up    Patient has no concerns at this time. Patient is supposed to have right shoulder surgery and is here for clearance.    She has developed cellulitis of her left lower extremity, completing course of doxycycline, received 2 doses of ceftriaxone.  She has stopped taking methotrexate. Breathing is okay she continues on Anoro and uses albuterol on a as needed basis. She is planning on right shoulder surgery and needs clearance from a pulmonary standpoint. No problems with mask or pressure, reports compliance with her biPAP   Significant tests/ events reviewed  PFTs 02/2019 normal, no evidence of airway obstruction   CPAP titration 08/2005 , 295 pounds , + 9 cm 11/2011 BiPAP titration, difficulty tolerating CPAP, IPAP 14 cm, EPAP 9   12/2011 CPAP 9 cm  Review of Systems neg for any significant sore throat, dysphagia, itching, sneezing, nasal congestion or excess/ purulent secretions, fever, chills, sweats, unintended wt loss, pleuritic or exertional cp, hempoptysis, orthopnea pnd or change in chronic leg swelling. Also denies presyncope, palpitations, heartburn, abdominal pain, nausea, vomiting, diarrhea or change in bowel or urinary habits, dysuria,hematuria, rash, arthralgias, visual complaints, headache, numbness weakness or ataxia.     Objective:   Physical Exam  Gen. Pleasant, obese, in no distress ENT - no lesions, no post nasal drip Neck: No JVD, no thyromegaly, no carotid bruits Lungs: no use of accessory muscles, no dullness to percussion, decreased without rales or rhonchi  Cardiovascular: Rhythm regular,  heart sounds  normal, no murmurs or gallops, no peripheral edema Musculoskeletal: No deformities, no cyanosis or clubbing , no tremors Left leg appears more swollen than right with erythema      Assessment & Plan:

## 2020-07-28 NOTE — Assessment & Plan Note (Signed)
BiPAP download was reviewed which shows good control of events on BiPAP 14/9 with minimal leak and compliance about 5.5 hours on average every night. BiPAP is only helped improve her daytime somnolence and fatigue.  She is compliant  I have asked her to take her BiPAP machine with her when she goes in for surgery.  Routine precautions for OSA intraoperatively and emphasized BiPAP compliance.

## 2020-07-28 NOTE — Patient Instructions (Addendum)
Cleared for surgery from breathing standpoint Take BiPAP with you on day of surgery

## 2020-07-29 DIAGNOSIS — L97511 Non-pressure chronic ulcer of other part of right foot limited to breakdown of skin: Secondary | ICD-10-CM | POA: Diagnosis not present

## 2020-07-29 DIAGNOSIS — Z79899 Other long term (current) drug therapy: Secondary | ICD-10-CM | POA: Diagnosis not present

## 2020-07-29 DIAGNOSIS — R5383 Other fatigue: Secondary | ICD-10-CM | POA: Diagnosis not present

## 2020-07-29 DIAGNOSIS — Z0000 Encounter for general adult medical examination without abnormal findings: Secondary | ICD-10-CM | POA: Diagnosis not present

## 2020-07-29 DIAGNOSIS — Z299 Encounter for prophylactic measures, unspecified: Secondary | ICD-10-CM | POA: Diagnosis not present

## 2020-07-29 DIAGNOSIS — Z7189 Other specified counseling: Secondary | ICD-10-CM | POA: Diagnosis not present

## 2020-07-29 DIAGNOSIS — E7800 Pure hypercholesterolemia, unspecified: Secondary | ICD-10-CM | POA: Diagnosis not present

## 2020-07-29 DIAGNOSIS — I10 Essential (primary) hypertension: Secondary | ICD-10-CM | POA: Diagnosis not present

## 2020-07-30 DIAGNOSIS — E1165 Type 2 diabetes mellitus with hyperglycemia: Secondary | ICD-10-CM | POA: Diagnosis not present

## 2020-08-01 DIAGNOSIS — E1165 Type 2 diabetes mellitus with hyperglycemia: Secondary | ICD-10-CM | POA: Diagnosis not present

## 2020-08-10 DIAGNOSIS — R339 Retention of urine, unspecified: Secondary | ICD-10-CM | POA: Diagnosis not present

## 2020-08-11 DIAGNOSIS — M48061 Spinal stenosis, lumbar region without neurogenic claudication: Secondary | ICD-10-CM | POA: Diagnosis not present

## 2020-08-11 DIAGNOSIS — Z79891 Long term (current) use of opiate analgesic: Secondary | ICD-10-CM | POA: Diagnosis not present

## 2020-08-11 DIAGNOSIS — G894 Chronic pain syndrome: Secondary | ICD-10-CM | POA: Diagnosis not present

## 2020-08-11 DIAGNOSIS — E1142 Type 2 diabetes mellitus with diabetic polyneuropathy: Secondary | ICD-10-CM | POA: Diagnosis not present

## 2020-08-12 DIAGNOSIS — R2681 Unsteadiness on feet: Secondary | ICD-10-CM | POA: Diagnosis not present

## 2020-08-19 DIAGNOSIS — L97511 Non-pressure chronic ulcer of other part of right foot limited to breakdown of skin: Secondary | ICD-10-CM | POA: Diagnosis not present

## 2020-08-20 DIAGNOSIS — I10 Essential (primary) hypertension: Secondary | ICD-10-CM | POA: Diagnosis not present

## 2020-08-20 DIAGNOSIS — L03116 Cellulitis of left lower limb: Secondary | ICD-10-CM | POA: Diagnosis not present

## 2020-08-20 DIAGNOSIS — E1142 Type 2 diabetes mellitus with diabetic polyneuropathy: Secondary | ICD-10-CM | POA: Diagnosis not present

## 2020-08-20 DIAGNOSIS — Z299 Encounter for prophylactic measures, unspecified: Secondary | ICD-10-CM | POA: Diagnosis not present

## 2020-08-20 DIAGNOSIS — D849 Immunodeficiency, unspecified: Secondary | ICD-10-CM | POA: Diagnosis not present

## 2020-08-26 ENCOUNTER — Encounter: Payer: Self-pay | Admitting: Internal Medicine

## 2020-08-31 DIAGNOSIS — M150 Primary generalized (osteo)arthritis: Secondary | ICD-10-CM | POA: Diagnosis not present

## 2020-08-31 DIAGNOSIS — M8580 Other specified disorders of bone density and structure, unspecified site: Secondary | ICD-10-CM | POA: Diagnosis not present

## 2020-08-31 DIAGNOSIS — M5136 Other intervertebral disc degeneration, lumbar region: Secondary | ICD-10-CM | POA: Diagnosis not present

## 2020-08-31 DIAGNOSIS — L409 Psoriasis, unspecified: Secondary | ICD-10-CM | POA: Diagnosis not present

## 2020-08-31 DIAGNOSIS — L03116 Cellulitis of left lower limb: Secondary | ICD-10-CM | POA: Diagnosis not present

## 2020-08-31 DIAGNOSIS — L4050 Arthropathic psoriasis, unspecified: Secondary | ICD-10-CM | POA: Diagnosis not present

## 2020-08-31 DIAGNOSIS — R768 Other specified abnormal immunological findings in serum: Secondary | ICD-10-CM | POA: Diagnosis not present

## 2020-09-01 DIAGNOSIS — E1165 Type 2 diabetes mellitus with hyperglycemia: Secondary | ICD-10-CM | POA: Diagnosis not present

## 2020-09-08 DIAGNOSIS — R339 Retention of urine, unspecified: Secondary | ICD-10-CM | POA: Diagnosis not present

## 2020-09-09 DIAGNOSIS — L97511 Non-pressure chronic ulcer of other part of right foot limited to breakdown of skin: Secondary | ICD-10-CM | POA: Diagnosis not present

## 2020-09-22 DIAGNOSIS — Z79891 Long term (current) use of opiate analgesic: Secondary | ICD-10-CM | POA: Diagnosis not present

## 2020-09-22 DIAGNOSIS — G894 Chronic pain syndrome: Secondary | ICD-10-CM | POA: Diagnosis not present

## 2020-09-22 DIAGNOSIS — M48061 Spinal stenosis, lumbar region without neurogenic claudication: Secondary | ICD-10-CM | POA: Diagnosis not present

## 2020-09-22 DIAGNOSIS — E1142 Type 2 diabetes mellitus with diabetic polyneuropathy: Secondary | ICD-10-CM | POA: Diagnosis not present

## 2020-09-26 ENCOUNTER — Other Ambulatory Visit: Payer: Self-pay | Admitting: Cardiology

## 2020-10-01 DIAGNOSIS — I4891 Unspecified atrial fibrillation: Secondary | ICD-10-CM | POA: Diagnosis not present

## 2020-10-01 DIAGNOSIS — E1165 Type 2 diabetes mellitus with hyperglycemia: Secondary | ICD-10-CM | POA: Diagnosis not present

## 2020-10-05 DIAGNOSIS — I10 Essential (primary) hypertension: Secondary | ICD-10-CM | POA: Diagnosis not present

## 2020-10-05 DIAGNOSIS — F32A Depression, unspecified: Secondary | ICD-10-CM | POA: Diagnosis not present

## 2020-10-05 DIAGNOSIS — E1165 Type 2 diabetes mellitus with hyperglycemia: Secondary | ICD-10-CM | POA: Diagnosis not present

## 2020-10-05 DIAGNOSIS — K649 Unspecified hemorrhoids: Secondary | ICD-10-CM | POA: Diagnosis not present

## 2020-10-05 DIAGNOSIS — Z299 Encounter for prophylactic measures, unspecified: Secondary | ICD-10-CM | POA: Diagnosis not present

## 2020-10-06 DIAGNOSIS — R339 Retention of urine, unspecified: Secondary | ICD-10-CM | POA: Diagnosis not present

## 2020-10-07 DIAGNOSIS — L97511 Non-pressure chronic ulcer of other part of right foot limited to breakdown of skin: Secondary | ICD-10-CM | POA: Diagnosis not present

## 2020-10-12 DIAGNOSIS — G894 Chronic pain syndrome: Secondary | ICD-10-CM | POA: Diagnosis not present

## 2020-10-12 DIAGNOSIS — M47816 Spondylosis without myelopathy or radiculopathy, lumbar region: Secondary | ICD-10-CM | POA: Diagnosis not present

## 2020-10-12 DIAGNOSIS — M48061 Spinal stenosis, lumbar region without neurogenic claudication: Secondary | ICD-10-CM | POA: Diagnosis not present

## 2020-10-12 DIAGNOSIS — E1142 Type 2 diabetes mellitus with diabetic polyneuropathy: Secondary | ICD-10-CM | POA: Diagnosis not present

## 2020-10-16 DIAGNOSIS — R2681 Unsteadiness on feet: Secondary | ICD-10-CM | POA: Diagnosis not present

## 2020-10-24 ENCOUNTER — Other Ambulatory Visit: Payer: Self-pay | Admitting: Gastroenterology

## 2020-11-01 DIAGNOSIS — M797 Fibromyalgia: Secondary | ICD-10-CM | POA: Diagnosis not present

## 2020-11-01 DIAGNOSIS — E1165 Type 2 diabetes mellitus with hyperglycemia: Secondary | ICD-10-CM | POA: Diagnosis not present

## 2020-11-01 DIAGNOSIS — I4891 Unspecified atrial fibrillation: Secondary | ICD-10-CM | POA: Diagnosis not present

## 2020-11-16 DIAGNOSIS — L97511 Non-pressure chronic ulcer of other part of right foot limited to breakdown of skin: Secondary | ICD-10-CM | POA: Diagnosis not present

## 2020-11-18 DIAGNOSIS — E1142 Type 2 diabetes mellitus with diabetic polyneuropathy: Secondary | ICD-10-CM | POA: Diagnosis not present

## 2020-11-18 DIAGNOSIS — G894 Chronic pain syndrome: Secondary | ICD-10-CM | POA: Diagnosis not present

## 2020-11-18 DIAGNOSIS — R2681 Unsteadiness on feet: Secondary | ICD-10-CM | POA: Diagnosis not present

## 2020-11-18 DIAGNOSIS — M47816 Spondylosis without myelopathy or radiculopathy, lumbar region: Secondary | ICD-10-CM | POA: Diagnosis not present

## 2020-11-22 ENCOUNTER — Encounter: Payer: Self-pay | Admitting: Internal Medicine

## 2020-11-22 ENCOUNTER — Other Ambulatory Visit: Payer: Self-pay | Admitting: Gastroenterology

## 2020-11-22 NOTE — Telephone Encounter (Signed)
Courtney: Please call patient to let her know I am sending in a refill on her Motregity. She hasn't been seen since September 2021. Recommend she schedule an OV for additional refills. She can speak with Erline Levine to arrange her follow-up or call back when she is ready to schedule.   Erline Levine: Follow-up should be with Roseanne Kaufman, NP.

## 2020-11-24 ENCOUNTER — Encounter: Payer: Self-pay | Admitting: *Deleted

## 2020-11-24 NOTE — Telephone Encounter (Signed)
Noted  

## 2020-11-24 NOTE — Telephone Encounter (Signed)
Tried calling pt several times, with no luck. Will mail a letter.

## 2020-12-01 ENCOUNTER — Other Ambulatory Visit: Payer: Self-pay | Admitting: Pulmonary Disease

## 2020-12-01 DIAGNOSIS — E1165 Type 2 diabetes mellitus with hyperglycemia: Secondary | ICD-10-CM | POA: Diagnosis not present

## 2020-12-07 DIAGNOSIS — M47816 Spondylosis without myelopathy or radiculopathy, lumbar region: Secondary | ICD-10-CM | POA: Diagnosis not present

## 2020-12-07 DIAGNOSIS — E1142 Type 2 diabetes mellitus with diabetic polyneuropathy: Secondary | ICD-10-CM | POA: Diagnosis not present

## 2020-12-07 DIAGNOSIS — G894 Chronic pain syndrome: Secondary | ICD-10-CM | POA: Diagnosis not present

## 2020-12-07 DIAGNOSIS — M48061 Spinal stenosis, lumbar region without neurogenic claudication: Secondary | ICD-10-CM | POA: Diagnosis not present

## 2020-12-08 DIAGNOSIS — Z719 Counseling, unspecified: Secondary | ICD-10-CM | POA: Diagnosis not present

## 2020-12-08 DIAGNOSIS — M8580 Other specified disorders of bone density and structure, unspecified site: Secondary | ICD-10-CM | POA: Diagnosis not present

## 2020-12-08 DIAGNOSIS — M5136 Other intervertebral disc degeneration, lumbar region: Secondary | ICD-10-CM | POA: Diagnosis not present

## 2020-12-08 DIAGNOSIS — M150 Primary generalized (osteo)arthritis: Secondary | ICD-10-CM | POA: Diagnosis not present

## 2020-12-08 DIAGNOSIS — L409 Psoriasis, unspecified: Secondary | ICD-10-CM | POA: Diagnosis not present

## 2020-12-08 DIAGNOSIS — R768 Other specified abnormal immunological findings in serum: Secondary | ICD-10-CM | POA: Diagnosis not present

## 2020-12-08 DIAGNOSIS — L03116 Cellulitis of left lower limb: Secondary | ICD-10-CM | POA: Diagnosis not present

## 2020-12-08 DIAGNOSIS — L4050 Arthropathic psoriasis, unspecified: Secondary | ICD-10-CM | POA: Diagnosis not present

## 2020-12-09 DIAGNOSIS — L97511 Non-pressure chronic ulcer of other part of right foot limited to breakdown of skin: Secondary | ICD-10-CM | POA: Diagnosis not present

## 2020-12-23 ENCOUNTER — Other Ambulatory Visit: Payer: Self-pay | Admitting: Nurse Practitioner

## 2020-12-23 ENCOUNTER — Other Ambulatory Visit: Payer: Self-pay | Admitting: Pulmonary Disease

## 2020-12-29 DIAGNOSIS — Z299 Encounter for prophylactic measures, unspecified: Secondary | ICD-10-CM | POA: Diagnosis not present

## 2020-12-29 DIAGNOSIS — D849 Immunodeficiency, unspecified: Secondary | ICD-10-CM | POA: Diagnosis not present

## 2020-12-29 DIAGNOSIS — I4891 Unspecified atrial fibrillation: Secondary | ICD-10-CM | POA: Diagnosis not present

## 2020-12-29 DIAGNOSIS — J069 Acute upper respiratory infection, unspecified: Secondary | ICD-10-CM | POA: Diagnosis not present

## 2020-12-29 DIAGNOSIS — J42 Unspecified chronic bronchitis: Secondary | ICD-10-CM | POA: Diagnosis not present

## 2020-12-29 DIAGNOSIS — Z87891 Personal history of nicotine dependence: Secondary | ICD-10-CM | POA: Diagnosis not present

## 2020-12-31 DIAGNOSIS — E1165 Type 2 diabetes mellitus with hyperglycemia: Secondary | ICD-10-CM | POA: Diagnosis not present

## 2021-01-04 DIAGNOSIS — L97511 Non-pressure chronic ulcer of other part of right foot limited to breakdown of skin: Secondary | ICD-10-CM | POA: Diagnosis not present

## 2021-01-06 DIAGNOSIS — E1142 Type 2 diabetes mellitus with diabetic polyneuropathy: Secondary | ICD-10-CM | POA: Diagnosis not present

## 2021-01-06 DIAGNOSIS — G4733 Obstructive sleep apnea (adult) (pediatric): Secondary | ICD-10-CM | POA: Diagnosis not present

## 2021-01-06 DIAGNOSIS — M47816 Spondylosis without myelopathy or radiculopathy, lumbar region: Secondary | ICD-10-CM | POA: Diagnosis not present

## 2021-01-06 DIAGNOSIS — M48061 Spinal stenosis, lumbar region without neurogenic claudication: Secondary | ICD-10-CM | POA: Diagnosis not present

## 2021-01-06 DIAGNOSIS — J449 Chronic obstructive pulmonary disease, unspecified: Secondary | ICD-10-CM | POA: Diagnosis not present

## 2021-01-06 DIAGNOSIS — G894 Chronic pain syndrome: Secondary | ICD-10-CM | POA: Diagnosis not present

## 2021-01-07 DIAGNOSIS — M170 Bilateral primary osteoarthritis of knee: Secondary | ICD-10-CM | POA: Diagnosis not present

## 2021-01-11 ENCOUNTER — Encounter: Payer: Self-pay | Admitting: Internal Medicine

## 2021-01-19 ENCOUNTER — Encounter: Payer: Self-pay | Admitting: *Deleted

## 2021-01-19 ENCOUNTER — Ambulatory Visit (INDEPENDENT_AMBULATORY_CARE_PROVIDER_SITE_OTHER): Payer: Medicare Other | Admitting: Cardiology

## 2021-01-19 ENCOUNTER — Encounter: Payer: Self-pay | Admitting: Cardiology

## 2021-01-19 VITALS — BP 122/58 | HR 68 | Ht 68.0 in | Wt 329.4 lb

## 2021-01-19 DIAGNOSIS — I890 Lymphedema, not elsewhere classified: Secondary | ICD-10-CM

## 2021-01-19 DIAGNOSIS — I480 Paroxysmal atrial fibrillation: Secondary | ICD-10-CM

## 2021-01-19 NOTE — Patient Instructions (Addendum)

## 2021-01-19 NOTE — Progress Notes (Signed)
Cardiology Office Note  Date: 01/19/2021   ID: [name] [name], DOB 1960/08/26, MRN [id number]  PCP:  Glenda Chroman, MD  Cardiologist:  Rozann Lesches, MD Electrophysiologist:  None   Chief Complaint  Patient presents with   Cardiac follow-up    History of Present Illness: [name] [name] is a 61 y.o. female last seen in July 2022.  She is here for a routine visit.  She does not report any progressive palpitations on current medical regimen.  Also continues to have intermittent spells of chest discomfort, no increasing pattern and with previous reassuring cardiac work-up and history of esophageal spasm.  I reviewed her present medications which are noted below.  She does not report any spontaneous bleeding problems on Xarelto.  We are requesting her interval lab work from Dr. Woody Seller.  Past Medical History:  Diagnosis Date   Bladder dysfunction    Self urinary catheterization   COPD (chronic obstructive pulmonary disease) (HCC)    DDD (degenerative disc disease)    Depression    Esophageal spasm    NTG and Norvasc   Essential hypertension    Fibromyalgia    Gastroparesis    GERD (gastroesophageal reflux disease)    History of cardiac catheterization    Normal coronary arteries   History of pituitary tumor    Irritable bowel syndrome    Lymphedema    Osteoarthritis    PAF (paroxysmal atrial fibrillation) (HCC)    Sleep apnea    BIPAP   Type 2 diabetes mellitus ([place])    Urinary tract infection     Past Surgical History:  Procedure Laterality Date   ABDOMINAL HYSTERECTOMY     ANTERIOR CERVICAL DECOMP/DISCECTOMY FUSION N/A 12/02/2014   Procedure: Cervical five cervical six anterior cervical decompression with fusion interbody prosthesis plating and bone graft;  Surgeon: Newman Pies, MD;  Location: [facility];  Service: Neurosurgery;  Laterality: N/A;  C56 anterior cervical decompression with fusion interbody prosthesis plating and bonegraft   APPENDECTOMY      BACK SURGERY     CARDIAC CATHETERIZATION     CARDIOVERSION N/A 03/26/2014   Procedure: CARDIOVERSION;  Surgeon: Herminio Commons, MD;  Location: [facility];  Service: Endoscopy;  Laterality: N/A;   CARDIOVERSION N/A 05/04/2014   Procedure: CARDIOVERSION;  Surgeon: Satira Sark, MD;  Location: [facility];  Service: Cardiovascular;  Laterality: N/A;   CHOLECYSTECTOMY     COLONOSCOPY     Approximately 2003. Per medical records, internal hemorrhoids noted   COLONOSCOPY N/A 06/25/2013   Dr. Gala Romney: Anal canal hemorrhoids-more likely the source of paper hematochezia. Redundant, capacious colon. Multiple colonic polyps-tubular adenoma   COLONOSCOPY WITH PROPOFOL N/A 12/04/2016   Dr. Gala Romney: redundant colon, colonic diverticulosis. Screening due in 2023   ENDOVENOUS ABLATION SAPHENOUS VEIN W/ LASER Left 03/07/2018   endovenous ablation left greater saphenous vein by Deitra Mayo MD    ESOPHAGOGASTRODUODENOSCOPY  10/12/2004   [facility]:CWCB plaquing on the esophageal mucosa of uncertain significance, not typical of what is seen with candida esophagitis status post KOH brushing for KOH prep and biopsy for histology. Rule out candida esophagitis/eosinophilic esophagitis. Otherwise normal esophagus. Tiny hiatal hernia. Otherwise, normal stomach, normal D1 and D2. Benign biopsy of esophagus, unknown KOH status.    ESOPHAGOGASTRODUODENOSCOPY N/A 06/25/2013   Dr. Rourk:mild chronic gastritis   KNEE SURGERY     LEFT HEART CATHETERIZATION WITH CORONARY ANGIOGRAM N/A 11/15/2010   Procedure: LEFT HEART CATHETERIZATION WITH CORONARY ANGIOGRAM;  Surgeon: Laverda Page,  MD;  Location: [facility];  Service: Cardiovascular;  Laterality: N/A;   LUNG BIOPSY     RIGHT/LEFT HEART CATH AND CORONARY ANGIOGRAPHY N/A 08/28/2018   Procedure: RIGHT/LEFT HEART CATH AND CORONARY ANGIOGRAPHY;  Surgeon: Burnell Blanks, MD;  Location: [facility];  Service: Cardiovascular;  Laterality: N/A;    Current Outpatient  Medications  Medication Sig Dispense Refill   albuterol (PROVENTIL) (2.5 MG/3ML) 0.083% nebulizer solution Take 2.5 mg by nebulization every 6 (six) hours as needed for wheezing. For shortness of breath.     albuterol (VENTOLIN HFA) 108 (90 Base) MCG/ACT inhaler Inhale 2 puffs into the lungs every 6 (six) hours as needed for wheezing or shortness of breath.      ANORO ELLIPTA 62.5-25 MCG/ACT AEPB INHALE 1 PUFF BY MOUTH EVERY DAY 60 each 5   atorvastatin (LIPITOR) 20 MG tablet Take 20 mg by mouth daily.     baclofen (LIORESAL) 20 MG tablet Take 20 mg by mouth 3 (three) times daily.     buPROPion (WELLBUTRIN) 75 MG tablet Take 1 tablet (75 mg total) by mouth daily. 30 tablet 3   DEXILANT 60 MG capsule TAKE ONE CAPSULE BY MOUTH EVERY DAY 90 capsule 3   diclofenac sodium (VOLTAREN) 1 % GEL Apply 2-4 g topically 4 (four) times daily as needed (for pain).      diltiazem (CARDIZEM CD) 120 MG 24 hr capsule TAKE ONE CAPSULE BY MOUTH EVERY DAY 90 capsule 3   docusate sodium (COLACE) 100 MG capsule Take 100 mg by mouth 2 (two) times daily.     esomeprazole (NEXIUM) 40 MG capsule Take 1 capsule (40 mg total) by mouth daily before breakfast. 90 capsule 3   famotidine (PEPCID) 20 MG tablet TAKE 1 TABLET BY MOUTH EVERY DAY AS NEEDED FOR HEARTBURN OR INDIGESTION 30 tablet 3   flecainide (TAMBOCOR) 50 MG tablet TAKE TWO TABLETS BY MOUTH EVERY MORNING and TAKE TWO TABLETS BY MOUTH EVERY EVENING 120 tablet 6   fluticasone (FLONASE) 50 MCG/ACT nasal spray INSTILL 2 SPRAYS IN EACH NOSTRIL EVERY DAY 16 g 6   folic acid (FOLVITE) 1 MG tablet Take 1 mg by mouth daily.     furosemide (LASIX) 80 MG tablet Take 80 mg by mouth 2 (two) times daily.     gabapentin (NEURONTIN) 800 MG tablet Take 800 mg by mouth 4 (four) times daily.   2   glimepiride (AMARYL) 4 MG tablet Take 4 mg by mouth 2 (two) times daily.     HUMIRA PEN 40 MG/0.4ML PNKT Inject 40 mg into the skin every 14 (fourteen) days. Dr. Amil Amen     hydrocortisone  (ANUSOL-HC) 2.5 % rectal cream Place 1 application rectally 2 (two) times daily. (Patient taking differently: Place 1 application rectally 2 (two) times daily as needed for hemorrhoids.) 30 g 1   isosorbide mononitrate (IMDUR) 120 MG 24 hr tablet Take 120 mg by mouth daily. In the morning     isosorbide mononitrate (IMDUR) 30 MG 24 hr tablet Take 30 mg by mouth every evening.     lactulose (CHRONULAC) 10 GM/15ML solution Take by mouth as needed for mild constipation.     LINZESS 290 MCG CAPS capsule TAKE ONE CAPSULE BY MOUTH EVERY DAY BEFORE BREAKFAST - REPLACES MOVANTIK 30 capsule 5   lisinopril (PRINIVIL,ZESTRIL) 10 MG tablet Take 10 mg by mouth every evening.      loratadine (CLARITIN) 10 MG tablet Take 1 tablet (10 mg total) by mouth daily.  30 tablet 11   meclizine (ANTIVERT) 25 MG tablet Take 25 mg by mouth 3 (three) times daily as needed for dizziness.     metFORMIN (GLUCOPHAGE) 500 MG tablet Take 1,000 mg by mouth 2 (two) times daily.      metolazone (ZAROXOLYN) 2.5 MG tablet Take 2.5 mg by mouth daily with breakfast. Takes 2 tablets     MOTEGRITY 2 MG TABS TAKE 1 TABLET BY MOUTH DAILY 30 tablet 5   Multiple Vitamin (MULTIVITAMIN WITH MINERALS) TABS tablet Take 1 tablet by mouth daily.     nitroGLYCERIN (NITROLINGUAL) 0.4 MG/SPRAY spray USE 1 SPRAY UNDER THE TONGUE AS NEEDED FOR CHEST PAIN, May use for a total of three times,5 minutes apart 4.9 g 3   NUCYNTA ER 100 MG 12 hr tablet Take 100 mg by mouth 2 (two) times daily.     nystatin (MYCOSTATIN/NYSTOP) 100000 UNIT/GM POWD Apply 1 g topically daily. After bathing     ondansetron (ZOFRAN) 4 MG tablet TAKE 1 TABLET BY MOUTH EVERY 8 HOURS AS NEEDED FOR NAUSEA AND VOMITING 30 tablet 3   oxyCODONE-acetaminophen (PERCOCET) 10-325 MG per tablet Take 1 tablet by mouth every 6 (six) hours as needed for pain.      polyethylene glycol powder (GLYCOLAX/MIRALAX) powder Take 17-34 g by mouth daily.     potassium chloride SA (K-DUR,KLOR-CON) 20 MEQ tablet  Take 40 mEq by mouth 5 (five) times daily.     rivaroxaban (XARELTO) 20 MG TABS tablet Take 20 mg by mouth daily with supper.      Semaglutide,0.25 or 0.5MG /DOS, 2 MG/1.5ML SOPN Inject 0.25 mg into the skin every Tuesday. At lunch     spironolactone (ALDACTONE) 50 MG tablet Take 50 mg by mouth 3 (three) times daily.     TOUJEO SOLOSTAR 300 UNIT/ML Solostar Pen Inject 46 Units into the skin at bedtime.     VASCEPA 1 g CAPS Take 1 g by mouth 4 (four) times daily.     Current Facility-Administered Medications  Medication Dose Route Frequency Provider Last Rate Last Admin   sodium chloride flush (NS) 0.9 % injection 3 mL  3 mL Intravenous Q12H Satira Sark, MD       Allergies:  Tape, Hyoscyamine sulfate, and Metoclopramide   ROS: No syncope.  Chronic arthritic pains.  Physical Exam: VS:  BP (!) 122/58    Pulse 68    Ht 5\' 8"  (1.727 m)    Wt (!) 329 lb 6.4 oz (149.4 kg)    SpO2 95%    BMI 50.09 kg/m , BMI Body mass index is 50.09 kg/m.  Wt Readings from Last 3 Encounters:  01/19/21 (!) 329 lb 6.4 oz (149.4 kg)  07/28/20 (!) 339 lb 12.8 oz (154.1 kg)  07/14/20 (!) 341 lb (154.7 kg)    General: Patient appears comfortable at rest. HEENT: Conjunctiva and lids normal, wearing a mask. Neck: Supple, no elevated JVP or carotid bruits, no thyromegaly. Lungs: Clear to auscultation, nonlabored breathing at rest. Cardiac: Regular rate and rhythm, no S3 or significant systolic murmur, no pericardial rub. Extremities: Lymphedema as before.  ECG:  An ECG dated 07/14/2020 was personally reviewed today and demonstrated:  Sinus rhythm with prolonged PR interval, left anterior fascicular block.  Recent Labwork:  December 2021: Hemoglobin 10.9, platelets 335, BUN 12, creatinine 0.74, potassium 4.2  Other Studies Reviewed Today:  Cardiac catheterization 08/28/2018: The left ventricular systolic function is normal. The left ventricular ejection fraction is 55-65% by visual estimate.  1. No  angiographic evidence of CAD 2. Mildly elevated left ventricular filling pressures 3. Dyspnea likely multifactorial due to COPD, obesity and physical deconditioning with chronic diastolic CHF   No further ischemic workup.    Echocardiogram 06/17/2018 [facility]): Overall limited study based on report.  LVEF described as normal range with calculation provided at 54.8%.  Valves not well visualized.  Assessment and Plan:  1.  Paroxysmal atrial fibrillation with CHA2DS2-VASc score of 3.  She is doing well in terms of symptom control and remains on Cardizem CD, flecainide, and Xarelto.  Requesting interval lab work from PCP.  She does not report any spontaneous bleeding problems.  2.  Chronic lymphedema, continues with use of compression stockings as well as Lasix and Zaroxolyn with potassium supplement.  3.  OSA on BiPAP.  4.  History of recurrent chest pain with reassuring cardiac work-up over time including normal coronary arteries at cardiac catheterization in 2020, also known esophageal spasm which is being managed medically.  Medication Adjustments/Labs and Tests Ordered: Current medicines are reviewed at length with the patient today.  Concerns regarding medicines are outlined above.   Tests Ordered: No orders of the defined types were placed in this encounter.   Medication Changes: No orders of the defined types were placed in this encounter.   Disposition:  Follow up  6 months.  Signed, Satira Sark, MD, [facility] 01/19/2021 3:54 PM    [facility], [place], [place] [address] Phone: [phone]; Fax: [phone]

## 2021-01-20 DIAGNOSIS — E11319 Type 2 diabetes mellitus with unspecified diabetic retinopathy without macular edema: Secondary | ICD-10-CM | POA: Diagnosis not present

## 2021-01-22 DIAGNOSIS — R2681 Unsteadiness on feet: Secondary | ICD-10-CM | POA: Diagnosis not present

## 2021-01-25 DIAGNOSIS — Z299 Encounter for prophylactic measures, unspecified: Secondary | ICD-10-CM | POA: Diagnosis not present

## 2021-01-25 DIAGNOSIS — I5032 Chronic diastolic (congestive) heart failure: Secondary | ICD-10-CM | POA: Diagnosis not present

## 2021-01-25 DIAGNOSIS — Z789 Other specified health status: Secondary | ICD-10-CM | POA: Diagnosis not present

## 2021-01-25 DIAGNOSIS — E1165 Type 2 diabetes mellitus with hyperglycemia: Secondary | ICD-10-CM | POA: Diagnosis not present

## 2021-01-25 DIAGNOSIS — I10 Essential (primary) hypertension: Secondary | ICD-10-CM | POA: Diagnosis not present

## 2021-01-25 DIAGNOSIS — M069 Rheumatoid arthritis, unspecified: Secondary | ICD-10-CM | POA: Diagnosis not present

## 2021-01-25 DIAGNOSIS — I4891 Unspecified atrial fibrillation: Secondary | ICD-10-CM | POA: Diagnosis not present

## 2021-01-27 DIAGNOSIS — L97521 Non-pressure chronic ulcer of other part of left foot limited to breakdown of skin: Secondary | ICD-10-CM | POA: Diagnosis not present

## 2021-01-30 DIAGNOSIS — E1165 Type 2 diabetes mellitus with hyperglycemia: Secondary | ICD-10-CM | POA: Diagnosis not present

## 2021-02-01 ENCOUNTER — Other Ambulatory Visit: Payer: Self-pay | Admitting: Gastroenterology

## 2021-02-01 DIAGNOSIS — E782 Mixed hyperlipidemia: Secondary | ICD-10-CM | POA: Diagnosis not present

## 2021-02-01 DIAGNOSIS — R110 Nausea: Secondary | ICD-10-CM

## 2021-02-01 DIAGNOSIS — K219 Gastro-esophageal reflux disease without esophagitis: Secondary | ICD-10-CM

## 2021-02-01 DIAGNOSIS — K582 Mixed irritable bowel syndrome: Secondary | ICD-10-CM

## 2021-02-01 DIAGNOSIS — I10 Essential (primary) hypertension: Secondary | ICD-10-CM | POA: Diagnosis not present

## 2021-02-02 ENCOUNTER — Telehealth: Payer: Self-pay

## 2021-02-02 DIAGNOSIS — R110 Nausea: Secondary | ICD-10-CM

## 2021-02-02 DIAGNOSIS — K582 Mixed irritable bowel syndrome: Secondary | ICD-10-CM

## 2021-02-02 DIAGNOSIS — K219 Gastro-esophageal reflux disease without esophagitis: Secondary | ICD-10-CM

## 2021-02-02 NOTE — Telephone Encounter (Signed)
Phoned and advised the pt of the note and recommendation for an ov. Pt stated she has been taking that nausea medication for years and nothing has ever happened to her. She states she would rather have the Rx because she already have a visit and they had told her up front that was the earliest appt they had. Please advise.

## 2021-02-02 NOTE — Telephone Encounter (Signed)
Pharmacist called regarding the Rx you sent in for this pt. She stated that it would cause a severe reacton mixed with the pt taking Flecainide. Please call [name] @ [phone] or send in something else for the pt.

## 2021-02-02 NOTE — Telephone Encounter (Signed)
Potential for QT prolongation. I believe she has been on this before. However, please ask the patient if still dealing with nausea. Promethazine would do similar. Would recommend office visit if still having nausea to determine best course of action.

## 2021-02-07 ENCOUNTER — Other Ambulatory Visit: Payer: Self-pay | Admitting: Cardiology

## 2021-02-07 ENCOUNTER — Other Ambulatory Visit: Payer: Self-pay | Admitting: Pulmonary Disease

## 2021-02-08 DIAGNOSIS — M48061 Spinal stenosis, lumbar region without neurogenic claudication: Secondary | ICD-10-CM | POA: Diagnosis not present

## 2021-02-08 DIAGNOSIS — E1142 Type 2 diabetes mellitus with diabetic polyneuropathy: Secondary | ICD-10-CM | POA: Diagnosis not present

## 2021-02-08 DIAGNOSIS — M47816 Spondylosis without myelopathy or radiculopathy, lumbar region: Secondary | ICD-10-CM | POA: Diagnosis not present

## 2021-02-08 DIAGNOSIS — G894 Chronic pain syndrome: Secondary | ICD-10-CM | POA: Diagnosis not present

## 2021-02-10 DIAGNOSIS — L97521 Non-pressure chronic ulcer of other part of left foot limited to breakdown of skin: Secondary | ICD-10-CM | POA: Diagnosis not present

## 2021-02-14 MED ORDER — ONDANSETRON HCL 4 MG PO TABS: ORAL_TABLET | ORAL | 3 refills | Status: DC

## 2021-02-14 NOTE — Addendum Note (Signed)
Addended by: Annitta Needs on: 02/14/2021 02:52 PM   Modules accepted: Orders

## 2021-02-15 ENCOUNTER — Ambulatory Visit: Payer: Medicare Other | Admitting: Gastroenterology

## 2021-02-17 DIAGNOSIS — L97521 Non-pressure chronic ulcer of other part of left foot limited to breakdown of skin: Secondary | ICD-10-CM | POA: Diagnosis not present

## 2021-02-23 ENCOUNTER — Other Ambulatory Visit: Payer: Self-pay | Admitting: Cardiology

## 2021-03-01 DIAGNOSIS — E782 Mixed hyperlipidemia: Secondary | ICD-10-CM | POA: Diagnosis not present

## 2021-03-01 DIAGNOSIS — L97521 Non-pressure chronic ulcer of other part of left foot limited to breakdown of skin: Secondary | ICD-10-CM | POA: Diagnosis not present

## 2021-03-01 DIAGNOSIS — E1165 Type 2 diabetes mellitus with hyperglycemia: Secondary | ICD-10-CM | POA: Diagnosis not present

## 2021-03-01 DIAGNOSIS — I10 Essential (primary) hypertension: Secondary | ICD-10-CM | POA: Diagnosis not present

## 2021-03-03 DIAGNOSIS — E1142 Type 2 diabetes mellitus with diabetic polyneuropathy: Secondary | ICD-10-CM | POA: Diagnosis not present

## 2021-03-03 DIAGNOSIS — G894 Chronic pain syndrome: Secondary | ICD-10-CM | POA: Diagnosis not present

## 2021-03-03 DIAGNOSIS — M47816 Spondylosis without myelopathy or radiculopathy, lumbar region: Secondary | ICD-10-CM | POA: Diagnosis not present

## 2021-03-03 DIAGNOSIS — M48061 Spinal stenosis, lumbar region without neurogenic claudication: Secondary | ICD-10-CM | POA: Diagnosis not present

## 2021-03-03 DIAGNOSIS — Z79891 Long term (current) use of opiate analgesic: Secondary | ICD-10-CM | POA: Diagnosis not present

## 2021-03-09 ENCOUNTER — Encounter: Payer: Self-pay | Admitting: Pulmonary Disease

## 2021-03-09 ENCOUNTER — Other Ambulatory Visit: Payer: Self-pay

## 2021-03-09 ENCOUNTER — Ambulatory Visit (INDEPENDENT_AMBULATORY_CARE_PROVIDER_SITE_OTHER): Payer: Medicare Other | Admitting: Pulmonary Disease

## 2021-03-09 DIAGNOSIS — J449 Chronic obstructive pulmonary disease, unspecified: Secondary | ICD-10-CM

## 2021-03-09 DIAGNOSIS — G4733 Obstructive sleep apnea (adult) (pediatric): Secondary | ICD-10-CM

## 2021-03-09 MED ORDER — ALBUTEROL SULFATE HFA 108 (90 BASE) MCG/ACT IN AERS: 2.0000 | INHALATION_SPRAY | Freq: Four times a day (QID) | RESPIRATORY_TRACT | 11 refills | Status: DC | PRN

## 2021-03-09 NOTE — Progress Notes (Signed)
? ?  Subjective:  ? ? Patient ID: [name] [name], female    DOB: 11-02-1960, 61 y.o.   MRN: [id number] ? ?HPI ? ?61 yo ex-smoker  for FU of COPD and OSA. ?  ?PMH -osteoarthritis, morbid obesity, atrial fibrillation on Xarelto, bilateral lower extremity lymphedema , rheumatoid arthritis on Humira, covid infection jan 2022 ? ? ?Chief Complaint  ?Patient presents with  ? Follow-up  ?  CPAP working well  ? ? ?Last office visit we cleared her for shoulder surgery but she never got this done.  She had dental issues which were not resolved and shoulder surgery has been indefinitely postponed. ?She reports shortness of breath worse in the pollen season, remains compliant with Anoro, uses albuterol MDI for breakthrough and needs a refill. ? ?She is compliant with CPAP, only gets a mask replacement once every 6 months and reports that this is wearing out.  Pressure is okay, headgear seems to dig into her face. ?She inquires about inspire device ? ? ? ?Significant tests/ events reviewed ? ?PFTs 02/2019 normal, no evidence of airway obstruction ?  ?CPAP titration 08/2005 , 295 pounds , + 9 cm ?11/2011 BiPAP titration, difficulty tolerating CPAP, IPAP 14 cm, EPAP 9 ?  ?12/2011 CPAP 9 cm ? ? ? ?Review of Systems ?neg for any significant sore throat, dysphagia, itching, sneezing, nasal congestion or excess/ purulent secretions, fever, chills, sweats, unintended wt loss, pleuritic or exertional cp, hempoptysis, orthopnea pnd or change in chronic leg swelling. Also denies presyncope, palpitations, heartburn, abdominal pain, nausea, vomiting, diarrhea or change in bowel or urinary habits, dysuria,hematuria, rash, arthralgias, visual complaints, headache, numbness weakness or ataxia. ? ?   ?Objective:  ? Physical Exam ? ?Gen. Pleasant, obese, in no distress ?ENT - no lesions, no post nasal drip ?Neck: No JVD, no thyromegaly, no carotid bruits ?Lungs: no use of accessory muscles, no dullness to percussion, decreased without rales or  rhonchi  ?Cardiovascular: Rhythm regular, heart sounds  normal, no murmurs or gallops, no peripheral edema ?Musculoskeletal: No deformities, no cyanosis or clubbing , no tremors ? ? ? ? ?   ?Assessment & Plan:  ? ? ?

## 2021-03-09 NOTE — Assessment & Plan Note (Signed)
BiPAP download was reviewed which shows good compliance more than 5.5 hours every night, no missed nights, mild leak and good control of events on 14/9 cm ?She is very compliant and BiPAP is only helped improve her daytime somnolence and fatigue. ?Unfortunately due to her high BMI she would not qualify for hypoglossal nerve stimulation, we discussed this therapy in detail ? ?Weight loss encouraged, compliance with goal of at least 4-6 hrs every night is the expectation. ?Advised against medications with sedative side effects ?Cautioned against driving when sleepy - understanding that sleepiness will vary on a day to day basis ? ?

## 2021-03-09 NOTE — Patient Instructions (Signed)
?  X cpap supplies will be renewed ? ?X albuterol MDI refill ?

## 2021-03-09 NOTE — Assessment & Plan Note (Signed)
Strictly speaking she does not have airway obstruction but we will continue Anoro and albuterol on as-needed basis based on subjective improvement ?

## 2021-03-18 DIAGNOSIS — R109 Unspecified abdominal pain: Secondary | ICD-10-CM | POA: Diagnosis not present

## 2021-03-22 DIAGNOSIS — L97511 Non-pressure chronic ulcer of other part of right foot limited to breakdown of skin: Secondary | ICD-10-CM | POA: Diagnosis not present

## 2021-03-30 DIAGNOSIS — R768 Other specified abnormal immunological findings in serum: Secondary | ICD-10-CM | POA: Diagnosis not present

## 2021-03-30 DIAGNOSIS — M1991 Primary osteoarthritis, unspecified site: Secondary | ICD-10-CM | POA: Diagnosis not present

## 2021-03-30 DIAGNOSIS — M5136 Other intervertebral disc degeneration, lumbar region: Secondary | ICD-10-CM | POA: Diagnosis not present

## 2021-03-30 DIAGNOSIS — L409 Psoriasis, unspecified: Secondary | ICD-10-CM | POA: Diagnosis not present

## 2021-03-30 DIAGNOSIS — L4050 Arthropathic psoriasis, unspecified: Secondary | ICD-10-CM | POA: Diagnosis not present

## 2021-03-30 DIAGNOSIS — L03116 Cellulitis of left lower limb: Secondary | ICD-10-CM | POA: Diagnosis not present

## 2021-03-30 DIAGNOSIS — M8580 Other specified disorders of bone density and structure, unspecified site: Secondary | ICD-10-CM | POA: Diagnosis not present

## 2021-03-31 DIAGNOSIS — E1165 Type 2 diabetes mellitus with hyperglycemia: Secondary | ICD-10-CM | POA: Diagnosis not present

## 2021-03-31 DIAGNOSIS — R2681 Unsteadiness on feet: Secondary | ICD-10-CM | POA: Diagnosis not present

## 2021-04-05 DIAGNOSIS — M48061 Spinal stenosis, lumbar region without neurogenic claudication: Secondary | ICD-10-CM | POA: Diagnosis not present

## 2021-04-05 DIAGNOSIS — G894 Chronic pain syndrome: Secondary | ICD-10-CM | POA: Diagnosis not present

## 2021-04-05 DIAGNOSIS — M47816 Spondylosis without myelopathy or radiculopathy, lumbar region: Secondary | ICD-10-CM | POA: Diagnosis not present

## 2021-04-05 DIAGNOSIS — E1142 Type 2 diabetes mellitus with diabetic polyneuropathy: Secondary | ICD-10-CM | POA: Diagnosis not present

## 2021-04-06 DIAGNOSIS — J449 Chronic obstructive pulmonary disease, unspecified: Secondary | ICD-10-CM | POA: Diagnosis not present

## 2021-04-06 DIAGNOSIS — G4733 Obstructive sleep apnea (adult) (pediatric): Secondary | ICD-10-CM | POA: Diagnosis not present

## 2021-04-12 DIAGNOSIS — L97521 Non-pressure chronic ulcer of other part of left foot limited to breakdown of skin: Secondary | ICD-10-CM | POA: Diagnosis not present

## 2021-04-16 DIAGNOSIS — R2681 Unsteadiness on feet: Secondary | ICD-10-CM | POA: Diagnosis not present

## 2021-04-25 ENCOUNTER — Other Ambulatory Visit: Payer: Self-pay | Admitting: Gastroenterology

## 2021-04-25 NOTE — Telephone Encounter (Signed)
Last ov 09/09/2019 ?

## 2021-04-25 NOTE — Telephone Encounter (Signed)
Please arrange office follow-up visit. Can be virtual. Needs further refills.  ?

## 2021-04-26 ENCOUNTER — Encounter: Payer: Self-pay | Admitting: Internal Medicine

## 2021-04-28 ENCOUNTER — Ambulatory Visit: Payer: Medicare Other | Admitting: Gastroenterology

## 2021-05-01 DIAGNOSIS — E7800 Pure hypercholesterolemia, unspecified: Secondary | ICD-10-CM | POA: Diagnosis not present

## 2021-05-01 DIAGNOSIS — E1165 Type 2 diabetes mellitus with hyperglycemia: Secondary | ICD-10-CM | POA: Diagnosis not present

## 2021-05-01 DIAGNOSIS — I10 Essential (primary) hypertension: Secondary | ICD-10-CM | POA: Diagnosis not present

## 2021-05-03 DIAGNOSIS — M48061 Spinal stenosis, lumbar region without neurogenic claudication: Secondary | ICD-10-CM | POA: Diagnosis not present

## 2021-05-03 DIAGNOSIS — G894 Chronic pain syndrome: Secondary | ICD-10-CM | POA: Diagnosis not present

## 2021-05-03 DIAGNOSIS — M47816 Spondylosis without myelopathy or radiculopathy, lumbar region: Secondary | ICD-10-CM | POA: Diagnosis not present

## 2021-05-03 DIAGNOSIS — E1142 Type 2 diabetes mellitus with diabetic polyneuropathy: Secondary | ICD-10-CM | POA: Diagnosis not present

## 2021-05-05 DIAGNOSIS — M79676 Pain in unspecified toe(s): Secondary | ICD-10-CM | POA: Diagnosis not present

## 2021-05-05 DIAGNOSIS — B351 Tinea unguium: Secondary | ICD-10-CM | POA: Diagnosis not present

## 2021-05-05 DIAGNOSIS — L84 Corns and callosities: Secondary | ICD-10-CM | POA: Diagnosis not present

## 2021-05-05 DIAGNOSIS — E1142 Type 2 diabetes mellitus with diabetic polyneuropathy: Secondary | ICD-10-CM | POA: Diagnosis not present

## 2021-05-06 DIAGNOSIS — M170 Bilateral primary osteoarthritis of knee: Secondary | ICD-10-CM | POA: Diagnosis not present

## 2021-05-10 DIAGNOSIS — Z299 Encounter for prophylactic measures, unspecified: Secondary | ICD-10-CM | POA: Diagnosis not present

## 2021-05-10 DIAGNOSIS — L97509 Non-pressure chronic ulcer of other part of unspecified foot with unspecified severity: Secondary | ICD-10-CM | POA: Diagnosis not present

## 2021-05-10 DIAGNOSIS — E1165 Type 2 diabetes mellitus with hyperglycemia: Secondary | ICD-10-CM | POA: Diagnosis not present

## 2021-05-10 DIAGNOSIS — E1142 Type 2 diabetes mellitus with diabetic polyneuropathy: Secondary | ICD-10-CM | POA: Diagnosis not present

## 2021-05-10 DIAGNOSIS — I5032 Chronic diastolic (congestive) heart failure: Secondary | ICD-10-CM | POA: Diagnosis not present

## 2021-05-12 DIAGNOSIS — R2681 Unsteadiness on feet: Secondary | ICD-10-CM | POA: Diagnosis not present

## 2021-05-19 DIAGNOSIS — I80292 Phlebitis and thrombophlebitis of other deep vessels of left lower extremity: Secondary | ICD-10-CM | POA: Diagnosis not present

## 2021-05-20 DIAGNOSIS — R600 Localized edema: Secondary | ICD-10-CM | POA: Diagnosis not present

## 2021-05-20 DIAGNOSIS — R2242 Localized swelling, mass and lump, left lower limb: Secondary | ICD-10-CM | POA: Diagnosis not present

## 2021-05-20 DIAGNOSIS — M25462 Effusion, left knee: Secondary | ICD-10-CM | POA: Diagnosis not present

## 2021-05-20 DIAGNOSIS — M79662 Pain in left lower leg: Secondary | ICD-10-CM | POA: Diagnosis not present

## 2021-05-20 DIAGNOSIS — M7989 Other specified soft tissue disorders: Secondary | ICD-10-CM | POA: Diagnosis not present

## 2021-05-20 DIAGNOSIS — M79605 Pain in left leg: Secondary | ICD-10-CM | POA: Diagnosis not present

## 2021-05-23 ENCOUNTER — Other Ambulatory Visit: Payer: Self-pay | Admitting: Gastroenterology

## 2021-05-23 NOTE — Telephone Encounter (Signed)
Needs office visit for further refills. Can be virtual

## 2021-05-25 ENCOUNTER — Ambulatory Visit (INDEPENDENT_AMBULATORY_CARE_PROVIDER_SITE_OTHER): Payer: Medicare Other | Admitting: Gastroenterology

## 2021-05-25 ENCOUNTER — Encounter: Payer: Self-pay | Admitting: Gastroenterology

## 2021-05-25 VITALS — BP 138/52 | HR 81 | Temp 97.3°F | Ht 68.0 in | Wt 330.8 lb

## 2021-05-25 DIAGNOSIS — R110 Nausea: Secondary | ICD-10-CM

## 2021-05-25 DIAGNOSIS — Z86010 Personal history of colonic polyps: Secondary | ICD-10-CM

## 2021-05-25 DIAGNOSIS — K219 Gastro-esophageal reflux disease without esophagitis: Secondary | ICD-10-CM

## 2021-05-25 NOTE — Progress Notes (Signed)
Gastroenterology Office Note     Primary Care Physician:  Glenda Chroman, MD  Primary Gastroenterologist: Dr. Gala Romney    Chief Complaint   Chief Complaint  Patient presents with   Follow-up    Doing well.      History of Present Illness   [name] [name] is a 61 y.o. female presenting today in follow-up with a history of OIC, GERD, gastroparesis for routine follow-up. Colonoscopy due now 2023 due to history of polyps (multiple colon polyps in 2015, no polyps in 2018). Multiple distant relatives with colon cancer but no first degree relatives.   Dexilant controlling GERD. Will take Pepcid every now and then depending on what she eats. Zofran helps with nausea. No vomiting. Good appetite.   Linzess 290 mcg, Motegrity, stool softener BID, and 2 scoops of Miralax daily for constipation. Has BM daily. No straining. This concoction works well for her. No overt GI bleeding.   On Humira for RA.   States she was kicking her legs a lot last time with Propofol. Concerned about sedation.      Past Medical History:  Diagnosis Date   Bladder dysfunction    Self urinary catheterization   COPD (chronic obstructive pulmonary disease) (HCC)    DDD (degenerative disc disease)    Depression    Esophageal spasm    NTG and Norvasc   Essential hypertension    Fibromyalgia    Gastroparesis    GERD (gastroesophageal reflux disease)    History of cardiac catheterization    Normal coronary arteries   History of pituitary tumor    Irritable bowel syndrome    Lymphedema    Osteoarthritis    PAF (paroxysmal atrial fibrillation) (HCC)    Sleep apnea    BIPAP   Type 2 diabetes mellitus ([place])    Urinary tract infection     Past Surgical History:  Procedure Laterality Date   ABDOMINAL HYSTERECTOMY     ANTERIOR CERVICAL DECOMP/DISCECTOMY FUSION N/A 12/02/2014   Procedure: Cervical five cervical six anterior cervical decompression with fusion interbody prosthesis plating and bone  graft;  Surgeon: Newman Pies, MD;  Location: [facility];  Service: Neurosurgery;  Laterality: N/A;  C56 anterior cervical decompression with fusion interbody prosthesis plating and bonegraft   APPENDECTOMY     BACK SURGERY     CARDIAC CATHETERIZATION     CARDIOVERSION N/A 03/26/2014   Procedure: CARDIOVERSION;  Surgeon: Herminio Commons, MD;  Location: [facility];  Service: Endoscopy;  Laterality: N/A;   CARDIOVERSION N/A 05/04/2014   Procedure: CARDIOVERSION;  Surgeon: Satira Sark, MD;  Location: [facility];  Service: Cardiovascular;  Laterality: N/A;   CHOLECYSTECTOMY     COLONOSCOPY     Approximately 2003. Per medical records, internal hemorrhoids noted   COLONOSCOPY N/A 06/25/2013   Dr. Gala Romney: Anal canal hemorrhoids-more likely the source of paper hematochezia. Redundant, capacious colon. Multiple colonic polyps-tubular adenoma   COLONOSCOPY WITH PROPOFOL N/A 12/04/2016   Dr. Gala Romney: redundant colon, colonic diverticulosis. Screening due in 2023   ENDOVENOUS ABLATION SAPHENOUS VEIN W/ LASER Left 03/07/2018   endovenous ablation left greater saphenous vein by Deitra Mayo MD    ESOPHAGOGASTRODUODENOSCOPY  10/12/2004   JJO:ACZY plaquing on the esophageal mucosa of uncertain significance, not typical of what is seen with candida esophagitis status post KOH brushing for KOH prep and biopsy for histology. Rule out candida esophagitis/eosinophilic esophagitis. Otherwise normal esophagus. Tiny hiatal hernia. Otherwise, normal stomach, normal D1 and  D2. Benign biopsy of esophagus, unknown KOH status.    ESOPHAGOGASTRODUODENOSCOPY N/A 06/25/2013   Dr. Rourk:mild chronic gastritis   KNEE SURGERY     LEFT HEART CATHETERIZATION WITH CORONARY ANGIOGRAM N/A 11/15/2010   Procedure: LEFT HEART CATHETERIZATION WITH CORONARY ANGIOGRAM;  Surgeon: Laverda Page, MD;  Location: [facility];  Service: Cardiovascular;  Laterality: N/A;   LUNG BIOPSY     RIGHT/LEFT HEART CATH AND CORONARY ANGIOGRAPHY  N/A 08/28/2018   Procedure: RIGHT/LEFT HEART CATH AND CORONARY ANGIOGRAPHY;  Surgeon: Burnell Blanks, MD;  Location: [facility];  Service: Cardiovascular;  Laterality: N/A;    Current Outpatient Medications  Medication Sig Dispense Refill   albuterol (PROVENTIL) (2.5 MG/3ML) 0.083% nebulizer solution Take 2.5 mg by nebulization every 6 (six) hours as needed for wheezing. For shortness of breath.     albuterol (VENTOLIN HFA) 108 (90 Base) MCG/ACT inhaler Inhale 2 puffs into the lungs every 6 (six) hours as needed for wheezing or shortness of breath. 18 g 11   ANORO ELLIPTA 62.5-25 MCG/ACT AEPB INHALE 1 PUFF BY MOUTH EVERY DAY 60 each 5   atorvastatin (LIPITOR) 20 MG tablet Take 20 mg by mouth daily.     baclofen (LIORESAL) 20 MG tablet Take 20 mg by mouth 3 (three) times daily.     buPROPion (WELLBUTRIN) 75 MG tablet Take 1 tablet (75 mg total) by mouth daily. 30 tablet 3   DEXILANT 60 MG capsule TAKE ONE CAPSULE BY MOUTH EVERY DAY 90 capsule 3   diclofenac sodium (VOLTAREN) 1 % GEL Apply 2-4 g topically 4 (four) times daily as needed (for pain).      diltiazem (CARDIZEM CD) 120 MG 24 hr capsule TAKE ONE CAPSULE BY MOUTH EVERY DAY 90 capsule 3   docusate sodium (COLACE) 100 MG capsule Take 100 mg by mouth 2 (two) times daily.     famotidine (PEPCID) 20 MG tablet TAKE 1 TABLET BY MOUTH EVERY DAY AS NEEDED FOR HEARTBURN OR INDIGESTION 30 tablet 3   flecainide (TAMBOCOR) 50 MG tablet TAKE 2 TABLETS BY MOUTH EVERY MORNING and TAKE 2 TABLETS BY MOUTH EVERY EVENING 120 tablet 6   fluticasone (FLONASE) 50 MCG/ACT nasal spray INSTILL 2 SPRAYS IN EACH NOSTRIL EVERY DAY 16 g 6   folic acid (FOLVITE) 1 MG tablet Take 1 mg by mouth daily.     furosemide (LASIX) 80 MG tablet Take 80 mg by mouth 2 (two) times daily.     gabapentin (NEURONTIN) 800 MG tablet Take 800 mg by mouth 4 (four) times daily.   2   glimepiride (AMARYL) 4 MG tablet Take 4 mg by mouth 2 (two) times daily.     HUMIRA PEN  40 MG/0.4ML PNKT Inject 40 mg into the skin every 14 (fourteen) days. Dr. Amil Amen     hydrocortisone (ANUSOL-HC) 2.5 % rectal cream Place 1 application rectally 2 (two) times daily. (Patient taking differently: Place 1 application. rectally 2 (two) times daily as needed for hemorrhoids.) 30 g 1   isosorbide mononitrate (IMDUR) 120 MG 24 hr tablet Take 120 mg by mouth daily. In the morning     isosorbide mononitrate (IMDUR) 30 MG 24 hr tablet Take 30 mg by mouth every evening.     LINZESS 290 MCG CAPS capsule TAKE ONE CAPSULE BY MOUTH EVERY DAY BEFORE BREAKFAST - REPLACES MOVANTIK 30 capsule 5   lisinopril (PRINIVIL,ZESTRIL) 10 MG tablet Take 10 mg by mouth every evening.      loratadine (CLARITIN)  10 MG tablet Take 1 tablet (10 mg total) by mouth daily. 30 tablet 11   meclizine (ANTIVERT) 25 MG tablet Take 25 mg by mouth 3 (three) times daily as needed for dizziness.     metFORMIN (GLUCOPHAGE) 500 MG tablet Take 1,000 mg by mouth 2 (two) times daily.      metolazone (ZAROXOLYN) 2.5 MG tablet Take 2.5 mg by mouth daily with breakfast. Takes 2 tablets     MOTEGRITY 2 MG TABS TAKE 1 TABLET BY MOUTH DAILY 30 tablet 0   Multiple Vitamin (MULTIVITAMIN WITH MINERALS) TABS tablet Take 1 tablet by mouth daily.     nitroGLYCERIN (NITROLINGUAL) 0.4 MG/SPRAY spray USE 1 SPRAY UNDER THE TONGUE EVERY 5 MINUTES UP TO 3 DOSES AS NEEDED FOR CHEST PAIN. GO TO ER IF NOT RESOLVED 12 g 3   NUCYNTA ER 100 MG 12 hr tablet Take 100 mg by mouth 2 (two) times daily.     nystatin (MYCOSTATIN/NYSTOP) 100000 UNIT/GM POWD Apply 1 g topically daily. After bathing     ondansetron (ZOFRAN) 4 MG tablet TAKE 1 TABLET BY MOUTH EVERY 8 HOURS AS NEEDED FOR NAUSEA AND VOMITING 30 tablet 3   oxyCODONE-acetaminophen (PERCOCET) 10-325 MG per tablet Take 1 tablet by mouth every 6 (six) hours as needed for pain.      polyethylene glycol powder (GLYCOLAX/MIRALAX) powder Take 17-34 g by mouth daily.     potassium chloride SA (K-DUR,KLOR-CON)  20 MEQ tablet Take 40 mEq by mouth 5 (five) times daily.     rivaroxaban (XARELTO) 20 MG TABS tablet Take 20 mg by mouth daily with supper.      Semaglutide,0.25 or 0.'5MG'$ /DOS, 2 MG/1.5ML SOPN Inject 0.25 mg into the skin every Tuesday. At lunch     spironolactone (ALDACTONE) 50 MG tablet Take 50 mg by mouth 3 (three) times daily.     TOUJEO SOLOSTAR 300 UNIT/ML Solostar Pen Inject 46 Units into the skin at bedtime.     VASCEPA 1 g CAPS Take 1 g by mouth 4 (four) times daily.     Current Facility-Administered Medications  Medication Dose Route Frequency Provider Last Rate Last Admin   sodium chloride flush (NS) 0.9 % injection 3 mL  3 mL Intravenous Q12H Satira Sark, MD        Allergies as of 05/25/2021 - Review Complete 05/25/2021  Allergen Reaction Noted   Tape Other (See Comments) 11/21/2016   Hyoscyamine sulfate Other (See Comments)    Metoclopramide Other (See Comments) 08/17/2011    Family History  Problem Relation Age of Onset   Coronary artery disease Father        Died age 92   Heart attack Father    Arrhythmia Father        AF   Diabetes Father    Parkinson's disease Father    Arrhythmia Mother        AF   Stroke Mother    Dementia Mother    Cancer Mother        UTERINE    Parkinson's disease Mother    Arrhythmia Brother        AF   Colon cancer Maternal Grandfather    Colon cancer Paternal Aunt    Colon cancer Paternal Aunt    Diabetes Son    Cancer Sister        BREAST    Depression Sister    Breast cancer Sister    Depression Sister    Depression Sister  Breast cancer Maternal Aunt     Social History   Socioeconomic History   Marital status: Divorced    Spouse name: Not on file   Number of children: Not on file   Years of education: Not on file   Highest education level: Not on file  Occupational History   Not on file  Tobacco Use   Smoking status: Former    Packs/day: 1.50    Years: 30.00    Pack years: 45.00    Types: Cigarettes     Start date: 03/06/1977    Quit date: 01/03/2007    Years since quitting: 14.4   Smokeless tobacco: Never  Vaping Use   Vaping Use: Never used  Substance and Sexual Activity   Alcohol use: No    Alcohol/week: 0.0 standard drinks   Drug use: No   Sexual activity: Not Currently  Other Topics Concern   Not on file  Social History Narrative   Not on file   Social Determinants of Health   Financial Resource Strain: Not on file  Food Insecurity: Not on file  Transportation Needs: Not on file  Physical Activity: Not on file  Stress: Not on file  Social Connections: Not on file  Intimate Partner Violence: Not on file     Review of Systems   Gen: Denies any fever, chills, fatigue, weight loss, lack of appetite.  CV: Denies chest pain, heart palpitations, peripheral edema, syncope.  Resp: Denies shortness of breath at rest or with exertion. Denies wheezing or cough.  GI: see HPI GU : Denies urinary burning, urinary frequency, urinary hesitancy MS: Denies joint pain, muscle weakness, cramps, or limitation of movement.  Derm: Denies rash, itching, dry skin Psych: Denies depression, anxiety, memory loss, and confusion Heme: Denies bruising, bleeding, and enlarged lymph nodes.   Physical Exam   BP (!) 138/52 (BP Location: Right Arm, Patient Position: Sitting, Cuff Size: Large)   Pulse 81   Temp (!) 97.3 F (36.3 C) (Temporal)   Ht '5\' 8"'$  (1.727 m)   Wt (!) 330 lb 12.8 oz (150 kg)   SpO2 97%   BMI 50.30 kg/m  General:   Alert and oriented. Pleasant and cooperative. Well-nourished and well-developed.  Head:  Normocephalic and atraumatic. Eyes:  Without icterus Cardiac: S1 S2 present with soft systolic murmur Lungs: clear bilaterally  Abdomen:  +BS, soft, obese. No TTP. Unable to get on exam table.  Rectal:  Deferred  Msk:  Symmetrical without gross deformities. Normal posture. Extremities:  With TED hose. Bilateral lower extremity edema. Neurologic:  Alert and  oriented x4;   grossly normal neurologically. Skin:  Intact without significant lesions or rashes. Psych:  Alert and cooperative. Normal mood and affect.   Assessment   [name] [name] is a 61 y.o. female presenting today in follow-up with a history of OIC, GERD, gastroparesis for routine follow-up. Colonoscopy due now 2023 due to history of polyps (multiple colon polyps in 2015, no polyps in 2018). Multiple distant relatives with colon cancer but no first degree relatives.   OIC: on regimen of Linzess, Motegrity, stool softener, and Miralax that is working well.   GERD: controlled with Dexilant.  Gastroparesis: Zofran prn. Controlled well.   No concerning lower or upper GI signs/symptoms. She does report difficulty with sedation even with Propofol. May need augmenting with Phenergan at time of procedure. I have told her I will make a note in her chart.    PLAN    Proceed with colonoscopy  by Dr. Gala Romney in near future: the risks, benefits, and alternatives have been discussed with the patient in detail. The patient states understanding and desires to proceed. Return in 1 year as she is doing well   Annitta Needs, PhD, ANP-BC [facility]enter[facility]

## 2021-05-25 NOTE — Patient Instructions (Signed)
We are arranging a colonoscopy in the near future!  Please stop Xarelto 48 hours prior to procedure.  We will see you back in 1 year!  I enjoyed seeing you again today! As you know, I value our relationship and want to provide genuine, compassionate, and quality care. I welcome your feedback. If you receive a survey regarding your visit,  I greatly appreciate you taking time to fill this out. See you next time!  Annitta Needs, PhD, ANP-BC [facility] Gastroenterology

## 2021-05-26 ENCOUNTER — Telehealth: Payer: Self-pay | Admitting: *Deleted

## 2021-05-26 DIAGNOSIS — R601 Generalized edema: Secondary | ICD-10-CM | POA: Diagnosis not present

## 2021-05-26 NOTE — Telephone Encounter (Signed)
LMOVM to call back to schedule TCS with Dr. Rourk, ASA 3 

## 2021-05-31 DIAGNOSIS — L97521 Non-pressure chronic ulcer of other part of left foot limited to breakdown of skin: Secondary | ICD-10-CM | POA: Diagnosis not present

## 2021-05-31 DIAGNOSIS — E1165 Type 2 diabetes mellitus with hyperglycemia: Secondary | ICD-10-CM | POA: Diagnosis not present

## 2021-06-01 ENCOUNTER — Other Ambulatory Visit: Payer: Self-pay | Admitting: Gastroenterology

## 2021-06-01 DIAGNOSIS — K582 Mixed irritable bowel syndrome: Secondary | ICD-10-CM

## 2021-06-01 DIAGNOSIS — K219 Gastro-esophageal reflux disease without esophagitis: Secondary | ICD-10-CM

## 2021-06-01 DIAGNOSIS — R110 Nausea: Secondary | ICD-10-CM

## 2021-06-01 NOTE — Telephone Encounter (Signed)
Letter mailed

## 2021-06-02 DIAGNOSIS — G894 Chronic pain syndrome: Secondary | ICD-10-CM | POA: Diagnosis not present

## 2021-06-02 DIAGNOSIS — M47816 Spondylosis without myelopathy or radiculopathy, lumbar region: Secondary | ICD-10-CM | POA: Diagnosis not present

## 2021-06-02 DIAGNOSIS — E1142 Type 2 diabetes mellitus with diabetic polyneuropathy: Secondary | ICD-10-CM | POA: Diagnosis not present

## 2021-06-02 DIAGNOSIS — Z79891 Long term (current) use of opiate analgesic: Secondary | ICD-10-CM | POA: Diagnosis not present

## 2021-06-02 DIAGNOSIS — M48061 Spinal stenosis, lumbar region without neurogenic claudication: Secondary | ICD-10-CM | POA: Diagnosis not present

## 2021-06-07 DIAGNOSIS — L97521 Non-pressure chronic ulcer of other part of left foot limited to breakdown of skin: Secondary | ICD-10-CM | POA: Diagnosis not present

## 2021-06-07 NOTE — Telephone Encounter (Signed)
Pt called office to schedule TCS. Informed her we will call back to schedule TCS when Dr. Roseanne Kaufman next schedule is available.

## 2021-06-10 DIAGNOSIS — M170 Bilateral primary osteoarthritis of knee: Secondary | ICD-10-CM | POA: Diagnosis not present

## 2021-06-10 MED ORDER — PEG 3350-KCL-NA BICARB-NACL 420 G PO SOLR: ORAL | 0 refills | Status: DC

## 2021-06-10 NOTE — Addendum Note (Signed)
Addended by: Cheron Every on: 06/10/2021 08:32 AM   Modules accepted: Orders

## 2021-06-10 NOTE — Telephone Encounter (Signed)
Called pt. She has been scheduled for 7/14. Aware will mail instructions/pre-op appt. Rx sent to pharmacy.   PA approved via [facility]. Auth# [id number], DOS: Jul 15, 2021 - Oct 13, 2021

## 2021-06-13 ENCOUNTER — Encounter: Payer: Self-pay | Admitting: *Deleted

## 2021-06-14 DIAGNOSIS — L97521 Non-pressure chronic ulcer of other part of left foot limited to breakdown of skin: Secondary | ICD-10-CM | POA: Diagnosis not present

## 2021-06-17 DIAGNOSIS — M170 Bilateral primary osteoarthritis of knee: Secondary | ICD-10-CM | POA: Diagnosis not present

## 2021-06-21 ENCOUNTER — Other Ambulatory Visit: Payer: Self-pay | Admitting: Pulmonary Disease

## 2021-06-21 ENCOUNTER — Other Ambulatory Visit: Payer: Self-pay | Admitting: Gastroenterology

## 2021-06-21 DIAGNOSIS — L97521 Non-pressure chronic ulcer of other part of left foot limited to breakdown of skin: Secondary | ICD-10-CM | POA: Diagnosis not present

## 2021-06-21 DIAGNOSIS — R2681 Unsteadiness on feet: Secondary | ICD-10-CM | POA: Diagnosis not present

## 2021-06-29 DIAGNOSIS — M47816 Spondylosis without myelopathy or radiculopathy, lumbar region: Secondary | ICD-10-CM | POA: Diagnosis not present

## 2021-06-29 DIAGNOSIS — E1142 Type 2 diabetes mellitus with diabetic polyneuropathy: Secondary | ICD-10-CM | POA: Diagnosis not present

## 2021-06-29 DIAGNOSIS — G894 Chronic pain syndrome: Secondary | ICD-10-CM | POA: Diagnosis not present

## 2021-06-29 DIAGNOSIS — M48061 Spinal stenosis, lumbar region without neurogenic claudication: Secondary | ICD-10-CM | POA: Diagnosis not present

## 2021-06-30 DIAGNOSIS — E1165 Type 2 diabetes mellitus with hyperglycemia: Secondary | ICD-10-CM | POA: Diagnosis not present

## 2021-07-06 DIAGNOSIS — G4733 Obstructive sleep apnea (adult) (pediatric): Secondary | ICD-10-CM | POA: Diagnosis not present

## 2021-07-06 DIAGNOSIS — J449 Chronic obstructive pulmonary disease, unspecified: Secondary | ICD-10-CM | POA: Diagnosis not present

## 2021-07-08 ENCOUNTER — Telehealth: Payer: Self-pay | Admitting: *Deleted

## 2021-07-08 NOTE — Telephone Encounter (Signed)
Pt called in. She needed to r/s her procedure on for 7/14. Moved to 8/10 at 12:30pm. Aware will mail new instructions/pre-op appt. Already has prep.

## 2021-07-11 ENCOUNTER — Encounter: Payer: Self-pay | Admitting: *Deleted

## 2021-07-12 DIAGNOSIS — L97521 Non-pressure chronic ulcer of other part of left foot limited to breakdown of skin: Secondary | ICD-10-CM | POA: Diagnosis not present

## 2021-07-13 ENCOUNTER — Encounter (HOSPITAL_COMMUNITY): Payer: Medicare Other

## 2021-07-21 ENCOUNTER — Ambulatory Visit (INDEPENDENT_AMBULATORY_CARE_PROVIDER_SITE_OTHER): Payer: Medicare Other | Admitting: Cardiology

## 2021-07-21 ENCOUNTER — Encounter: Payer: Self-pay | Admitting: Cardiology

## 2021-07-21 VITALS — BP 120/60 | HR 62 | Ht 68.0 in | Wt 331.2 lb

## 2021-07-21 DIAGNOSIS — I480 Paroxysmal atrial fibrillation: Secondary | ICD-10-CM

## 2021-07-21 DIAGNOSIS — I890 Lymphedema, not elsewhere classified: Secondary | ICD-10-CM | POA: Diagnosis not present

## 2021-07-21 DIAGNOSIS — L97521 Non-pressure chronic ulcer of other part of left foot limited to breakdown of skin: Secondary | ICD-10-CM | POA: Diagnosis not present

## 2021-07-21 DIAGNOSIS — R2681 Unsteadiness on feet: Secondary | ICD-10-CM | POA: Diagnosis not present

## 2021-07-21 NOTE — Progress Notes (Signed)
Cardiology Office Note  Date: 07/21/2021   ID: [name] [name], DOB 1960-01-16, MRN [id number]  PCP:  Glenda Chroman, MD  Cardiologist:  Rozann Lesches, MD Electrophysiologist:  None   Chief Complaint  Patient presents with   Cardiac follow-up    History of Present Illness: [name] [name] is a 61 y.o. female last seen in January.  She is here for a follow-up visit.  Reports intermittent palpitations consistent with atrial fibrillation as before, no prolonged events however.  She maintains a stable cardiac regimen including Cardizem CD, flecainide and Xarelto.  I personally reviewed her ECG today which shows sinus rhythm with LVH and leftward axis, QRS duration 128 ms.  I went over her medications.  She will have lab work with PCP soon at physical.  No spontaneous bleeding problems reported.  Past Medical History:  Diagnosis Date   Bladder dysfunction    Self urinary catheterization   COPD (chronic obstructive pulmonary disease) (HCC)    DDD (degenerative disc disease)    Depression    Esophageal spasm    NTG and Norvasc   Essential hypertension    Fibromyalgia    Gastroparesis    GERD (gastroesophageal reflux disease)    History of cardiac catheterization    Normal coronary arteries   History of pituitary tumor    Irritable bowel syndrome    Lymphedema    Osteoarthritis    PAF (paroxysmal atrial fibrillation) (HCC)    Sleep apnea    BIPAP   Type 2 diabetes mellitus ([place])    Urinary tract infection     Past Surgical History:  Procedure Laterality Date   ABDOMINAL HYSTERECTOMY     ANTERIOR CERVICAL DECOMP/DISCECTOMY FUSION N/A 12/02/2014   Procedure: Cervical five cervical six anterior cervical decompression with fusion interbody prosthesis plating and bone graft;  Surgeon: Newman Pies, MD;  Location: [facility];  Service: Neurosurgery;  Laterality: N/A;  C56 anterior cervical decompression with fusion interbody prosthesis plating and bonegraft    APPENDECTOMY     BACK SURGERY     CARDIAC CATHETERIZATION     CARDIOVERSION N/A 03/26/2014   Procedure: CARDIOVERSION;  Surgeon: Herminio Commons, MD;  Location: [facility];  Service: Endoscopy;  Laterality: N/A;   CARDIOVERSION N/A 05/04/2014   Procedure: CARDIOVERSION;  Surgeon: Satira Sark, MD;  Location: [facility];  Service: Cardiovascular;  Laterality: N/A;   CHOLECYSTECTOMY     COLONOSCOPY     Approximately 2003. Per medical records, internal hemorrhoids noted   COLONOSCOPY N/A 06/25/2013   Dr. Gala Romney: Anal canal hemorrhoids-more likely the source of paper hematochezia. Redundant, capacious colon. Multiple colonic polyps-tubular adenoma   COLONOSCOPY WITH PROPOFOL N/A 12/04/2016   Dr. Gala Romney: redundant colon, colonic diverticulosis. Screening due in 2023   ENDOVENOUS ABLATION SAPHENOUS VEIN W/ LASER Left 03/07/2018   endovenous ablation left greater saphenous vein by Deitra Mayo MD    ESOPHAGOGASTRODUODENOSCOPY  10/12/2004   MW[facility]:XLKG plaquing on the esophageal mucosa of uncertain significance, not typical of what is seen with candida esophagitis status post KOH brushing for KOH prep and biopsy for histology. Rule out candida esophagitis/eosinophilic esophagitis. Otherwise normal esophagus. Tiny hiatal hernia. Otherwise, normal stomach, normal D1 and D2. Benign biopsy of esophagus, unknown KOH status.    ESOPHAGOGASTRODUODENOSCOPY N/A 06/25/2013   Dr. Rourk:mild chronic gastritis   KNEE SURGERY     LEFT HEART CATHETERIZATION WITH CORONARY ANGIOGRAM N/A 11/15/2010   Procedure: LEFT HEART CATHETERIZATION WITH CORONARY ANGIOGRAM;  Surgeon: Turner Daniels  Einar Gip, MD;  Location: [facility];  Service: Cardiovascular;  Laterality: N/A;   LUNG BIOPSY     RIGHT/LEFT HEART CATH AND CORONARY ANGIOGRAPHY N/A 08/28/2018   Procedure: RIGHT/LEFT HEART CATH AND CORONARY ANGIOGRAPHY;  Surgeon: Burnell Blanks, MD;  Location: [facility];  Service: Cardiovascular;  Laterality: N/A;     Current Outpatient Medications  Medication Sig Dispense Refill   albuterol (PROVENTIL) (2.5 MG/3ML) 0.083% nebulizer solution Take 2.5 mg by nebulization every 6 (six) hours as needed for wheezing. For shortness of breath.     albuterol (VENTOLIN HFA) 108 (90 Base) MCG/ACT inhaler Inhale 2 puffs into the lungs every 6 (six) hours as needed for wheezing or shortness of breath. 18 g 11   ANORO ELLIPTA 62.5-25 MCG/ACT AEPB INHALE 1 PUFF BY MOUTH EVERY DAY 60 each 5   atorvastatin (LIPITOR) 20 MG tablet Take 20 mg by mouth daily.     baclofen (LIORESAL) 20 MG tablet Take 20 mg by mouth 3 (three) times daily.     buPROPion (WELLBUTRIN) 75 MG tablet Take 1 tablet (75 mg total) by mouth daily. 30 tablet 3   DEXILANT 60 MG capsule TAKE ONE CAPSULE BY MOUTH EVERY DAY 90 capsule 3   diclofenac sodium (VOLTAREN) 1 % GEL Apply 2-4 g topically 4 (four) times daily as needed (for pain).      diltiazem (CARDIZEM CD) 120 MG 24 hr capsule TAKE ONE CAPSULE BY MOUTH EVERY DAY 90 capsule 3   docusate sodium (COLACE) 100 MG capsule Take 100 mg by mouth 2 (two) times daily.     famotidine (PEPCID) 20 MG tablet TAKE 1 TABLET BY MOUTH EVERY DAY AS NEEDED FOR HEARTBURN OR INDIGESTION 30 tablet 3   flecainide (TAMBOCOR) 50 MG tablet TAKE 2 TABLETS BY MOUTH EVERY MORNING and TAKE 2 TABLETS BY MOUTH EVERY EVENING 120 tablet 6   fluticasone (FLONASE) 50 MCG/ACT nasal spray INSTILL 2 SPRAYS IN EACH NOSTRIL EVERY DAY 16 g 6   folic acid (FOLVITE) 1 MG tablet Take 1 mg by mouth daily.     furosemide (LASIX) 80 MG tablet Take 80 mg by mouth 2 (two) times daily.     gabapentin (NEURONTIN) 800 MG tablet Take 800 mg by mouth 4 (four) times daily.   2   glimepiride (AMARYL) 4 MG tablet Take 4 mg by mouth 2 (two) times daily.     HUMIRA PEN 40 MG/0.4ML PNKT Inject 40 mg into the skin every 14 (fourteen) days. Dr. Amil Amen     hydrocortisone (ANUSOL-HC) 2.5 % rectal cream Place 1 application rectally 2 (two) times daily. (Patient  taking differently: Place 1 application  rectally 2 (two) times daily as needed for hemorrhoids.) 30 g 1   isosorbide mononitrate (IMDUR) 120 MG 24 hr tablet Take 120 mg by mouth daily. In the morning     isosorbide mononitrate (IMDUR) 30 MG 24 hr tablet Take 30 mg by mouth every evening.     LINZESS 290 MCG CAPS capsule TAKE ONE CAPSULE BY MOUTH EVERY DAY BEFORE BREAKFAST - REPLACES MOVANTIK 30 capsule 5   lisinopril (PRINIVIL,ZESTRIL) 10 MG tablet Take 10 mg by mouth every evening.      loratadine (CLARITIN) 10 MG tablet Take 1 tablet (10 mg total) by mouth daily. 30 tablet 11   meclizine (ANTIVERT) 25 MG tablet Take 25 mg by mouth 3 (three) times daily as needed for dizziness.     metFORMIN (GLUCOPHAGE) 500 MG tablet Take 1,000 mg by  mouth 2 (two) times daily.      metolazone (ZAROXOLYN) 2.5 MG tablet Take 2.5 mg by mouth daily with breakfast. Takes 2 tablets     MOTEGRITY 2 MG TABS TAKE 1 TABLET BY MOUTH DAILY 30 tablet 5   Multiple Vitamin (MULTIVITAMIN WITH MINERALS) TABS tablet Take 1 tablet by mouth daily.     mupirocin ointment (BACTROBAN) 2 % Apply 1 Application topically in the morning and at bedtime.     nitroGLYCERIN (NITROLINGUAL) 0.4 MG/SPRAY spray USE 1 SPRAY UNDER THE TONGUE EVERY 5 MINUTES UP TO 3 DOSES AS NEEDED FOR CHEST PAIN. GO TO ER IF NOT RESOLVED 12 g 3   NUCYNTA ER 100 MG 12 hr tablet Take 100 mg by mouth 2 (two) times daily.     nystatin (MYCOSTATIN/NYSTOP) 100000 UNIT/GM POWD Apply 1 g topically daily. After bathing     ondansetron (ZOFRAN) 4 MG tablet TAKE 1 TABLET BY MOUTH EVERY 8 HOURS AS NEEDED FOR NAUSEA AND VOMITING 30 tablet 3   oxyCODONE-acetaminophen (PERCOCET) 10-325 MG per tablet Take 1 tablet by mouth every 6 (six) hours as needed for pain.      polyethylene glycol powder (GLYCOLAX/MIRALAX) powder Take 17-34 g by mouth daily.     polyethylene glycol-electrolytes (NULYTELY) 420 g solution As directed 4000 mL 0   potassium chloride SA (K-DUR,KLOR-CON) 20 MEQ  tablet Take 40 mEq by mouth 5 (five) times daily.     rivaroxaban (XARELTO) 20 MG TABS tablet Take 20 mg by mouth daily with supper.      Semaglutide,0.25 or 0.'5MG'$ /DOS, 2 MG/1.5ML SOPN Inject 0.25 mg into the skin every Tuesday. At lunch     spironolactone (ALDACTONE) 50 MG tablet Take 50 mg by mouth 3 (three) times daily.     TOUJEO SOLOSTAR 300 UNIT/ML Solostar Pen Inject 46 Units into the skin at bedtime.     VASCEPA 1 g CAPS Take 1 g by mouth 4 (four) times daily.     Current Facility-Administered Medications  Medication Dose Route Frequency Provider Last Rate Last Admin   sodium chloride flush (NS) 0.9 % injection 3 mL  3 mL Intravenous Q12H Satira Sark, MD       Allergies:  Tape, Hyoscyamine sulfate, and Metoclopramide   ROS:  No syncope.  Physical Exam: VS:  BP 120/60   Pulse 62   Ht '5\' 8"'$  (1.727 m)   Wt (!) 331 lb 3.2 oz (150.2 kg)   SpO2 95%   BMI 50.36 kg/m , BMI Body mass index is 50.36 kg/m.  Wt Readings from Last 3 Encounters:  07/21/21 (!) 331 lb 3.2 oz (150.2 kg)  05/25/21 (!) 330 lb 12.8 oz (150 kg)  03/09/21 (!) 329 lb 9.6 oz (149.5 kg)    General: Patient appears comfortable at rest. HEENT: Conjunctiva and lids normal, oropharynx clear. Lungs: Clear to auscultation, nonlabored breathing at rest. Cardiac: Regular rate and rhythm, no S3 or significant systolic murmur. Extremities: Mild lymphedema changes.  ECG:  An ECG dated 07/14/2020 was personally reviewed today and demonstrated:  Sinus rhythm with prolonged PR interval, left anterior fascicular block.  Recent Labwork:  July 2022: Hemoglobin 11.2, platelets 326, TSH 1.65, cholesterol 105, triglycerides 238, HDL 29, LDL 39, BUN 14, creatinine 0.54, potassium 4.5, AST 17, ALT 9  Other Studies Reviewed Today:  Cardiac catheterization 08/28/2018: The left ventricular systolic function is normal. The left ventricular ejection fraction is 55-65% by visual estimate.   1. No angiographic evidence of  CAD 2.  Mildly elevated left ventricular filling pressures 3. Dyspnea likely multifactorial due to COPD, obesity and physical deconditioning with chronic diastolic CHF   No further ischemic workup.    Echocardiogram 06/17/2018 [facility]): Overall limited study based on report.  LVEF described as normal range with calculation provided at 54.8%.  Valves not well visualized.  Assessment and Plan:  1.  Paroxysmal atrial fibrillation with CHA2DS2-VASc score of 3.  Plan to continue current regimen including Cardizem CD, flecainide, and Xarelto.  She will have lab work with PCP in the near future.  No reported spontaneous bleeding problems.  2.  OSA on BiPAP.  3.  Chronic lymphedema, continues on stable diuretic regimen with potassium supplement and intermittent use of compression.  Medication Adjustments/Labs and Tests Ordered: Current medicines are reviewed at length with the patient today.  Concerns regarding medicines are outlined above.   Tests Ordered: Orders Placed This Encounter  Procedures   EKG 12-Lead    Medication Changes: No orders of the defined types were placed in this encounter.   Disposition:  Follow up  6 months.  Signed, Satira Sark, MD, [facility] 07/21/2021 2:28 PM    [facility] [place], [place], [place] [address] Phone: [phone]; Fax: [phone]

## 2021-07-21 NOTE — Patient Instructions (Signed)

## 2021-07-26 DIAGNOSIS — M47816 Spondylosis without myelopathy or radiculopathy, lumbar region: Secondary | ICD-10-CM | POA: Diagnosis not present

## 2021-07-26 DIAGNOSIS — E1142 Type 2 diabetes mellitus with diabetic polyneuropathy: Secondary | ICD-10-CM | POA: Diagnosis not present

## 2021-07-26 DIAGNOSIS — M48061 Spinal stenosis, lumbar region without neurogenic claudication: Secondary | ICD-10-CM | POA: Diagnosis not present

## 2021-07-26 DIAGNOSIS — G894 Chronic pain syndrome: Secondary | ICD-10-CM | POA: Diagnosis not present

## 2021-08-01 DIAGNOSIS — E1165 Type 2 diabetes mellitus with hyperglycemia: Secondary | ICD-10-CM | POA: Diagnosis not present

## 2021-08-04 NOTE — Patient Instructions (Signed)
[name] [name]  08/04/2021     '@PREFPERIOPPHARMACY'$ @   Your procedure is scheduled on  08/11/2021.   Report to [facility] at  1030  A.M.   Call this number if you have problems the morning of surgery:  [phone]   Remember:  Follow the diet and prep instructions given to you by the office.    Use your nebulizer and your inhalers before you come and bring your rescue inhaler with you.     Your last dose of xarelto should be on 08/08/2021.     Take 23 units of your toujeo the night before your procedure.       DO NOT take any medications for diabetes the morning of your procedure.      Take these medicines the morning of surgery with A SIP OF WATER          lioresal, wellbutrin, dexilant, cardiazem, pepcid, tambocor, neurontin, imdur, claritin, nyctnta, zofran (if needed), percocet (if needed).     Do not wear jewelry, make-up or nail polish.  Do not wear lotions, powders, or perfumes, or deodorant.  Do not shave 48 hours prior to surgery.  Men may shave face and neck.  Do not bring valuables to the hospital.  [facility] is not responsible for any belongings or valuables.  Contacts, dentures or bridgework may not be worn into surgery.  Leave your suitcase in the car.  After surgery it may be brought to your room.  For patients admitted to the hospital, discharge time will be determined by your treatment team.  Patients discharged the day of surgery will not be allowed to drive home and must have someone with them for 24 hours.    Special instructions:   DO NOT smoke tobacco or vape for 24 hours before your procedure.  Please read over the following fact sheets that you were given. Anesthesia Post-op Instructions and Care and Recovery After Surgery      Colonoscopy, Adult, Care After The following information offers guidance on how to care for yourself after your procedure. Your health care provider may also give you more specific instructions. If you have  problems or questions, contact your health care provider. What can I expect after the procedure? After the procedure, it is common to have: A small amount of blood in your stool for 24 hours after the procedure. Some gas. Mild cramping or bloating of your abdomen. Follow these instructions at home: Eating and drinking  Drink enough fluid to keep your urine pale yellow. Follow instructions from your health care provider about eating or drinking restrictions. Resume your normal diet as told by your health care provider. Avoid heavy or fried foods that are hard to digest. Activity Rest as told by your health care provider. Avoid sitting for a long time without moving. Get up to take short walks every 1-2 hours. This is important to improve blood flow and breathing. Ask for help if you feel weak or unsteady. Return to your normal activities as told by your health care provider. Ask your health care provider what activities are safe for you. Managing cramping and bloating  Try walking around when you have cramps or feel bloated. If directed, apply heat to your abdomen as told by your health care provider. Use the heat source that your health care provider recommends, such as a moist heat pack or a heating pad. Place a towel between your skin and the heat source. Leave  the heat on for 20-30 minutes. Remove the heat if your skin turns bright red. This is especially important if you are unable to feel pain, heat, or cold. You have a greater risk of getting burned. General instructions If you were given a sedative during the procedure, it can affect you for several hours. Do not drive or operate machinery until your health care provider says that it is safe. For the first 24 hours after the procedure: Do not sign important documents. Do not drink alcohol. Do your regular daily activities at a slower pace than normal. Eat soft foods that are easy to digest. Take over-the-counter and prescription  medicines only as told by your health care provider. Keep all follow-up visits. This is important. Contact a health care provider if: You have blood in your stool 2-3 days after the procedure. Get help right away if: You have more than a small spotting of blood in your stool. You have large blood clots in your stool. You have swelling of your abdomen. You have nausea or vomiting. You have a fever. You have increasing pain in your abdomen that is not relieved with medicine. These symptoms may be an emergency. Get help right away. Call 911. Do not wait to see if the symptoms will go away. Do not drive yourself to the hospital. Summary After the procedure, it is common to have a small amount of blood in your stool. You may also have mild cramping and bloating of your abdomen. If you were given a sedative during the procedure, it can affect you for several hours. Do not drive or operate machinery until your health care provider says that it is safe. Get help right away if you have a lot of blood in your stool, nausea or vomiting, a fever, or increased pain in your abdomen. This information is not intended to replace advice given to you by your health care provider. Make sure you discuss any questions you have with your health care provider. Document Revised: 08/11/2020 Document Reviewed: 08/11/2020 Elsevier Patient Education  [place] After This sheet gives you information about how to care for yourself after your procedure. Your health care provider may also give you more specific instructions. If you have problems or questions, contact your health care provider. What can I expect after the procedure? After the procedure, it is common to have: Tiredness. Forgetfulness about what happened after the procedure. Impaired judgment for important decisions. Nausea or vomiting. Some difficulty with balance. Follow these instructions at home: For the time  period you were told by your health care provider:     Rest as needed. Do not participate in activities where you could fall or become injured. Do not drive or use machinery. Do not drink alcohol. Do not take sleeping pills or medicines that cause drowsiness. Do not make important decisions or sign legal documents. Do not take care of children on your own. Eating and drinking Follow the diet that is recommended by your health care provider. Drink enough fluid to keep your urine pale yellow. If you vomit: Drink water, juice, or soup when you can drink without vomiting. Make sure you have little or no nausea before eating solid foods. General instructions Have a responsible adult stay with you for the time you are told. It is important to have someone help care for you until you are awake and alert. Take over-the-counter and prescription medicines only as told by your health care provider. If  you have sleep apnea, surgery and certain medicines can increase your risk for breathing problems. Follow instructions from your health care provider about wearing your sleep device: Anytime you are sleeping, including during daytime naps. While taking prescription pain medicines, sleeping medicines, or medicines that make you drowsy. Avoid smoking. Keep all follow-up visits as told by your health care provider. This is important. Contact a health care provider if: You keep feeling nauseous or you keep vomiting. You feel light-headed. You are still sleepy or having trouble with balance after 24 hours. You develop a rash. You have a fever. You have redness or swelling around the IV site. Get help right away if: You have trouble breathing. You have new-onset confusion at home. Summary For several hours after your procedure, you may feel tired. You may also be forgetful and have poor judgment. Have a responsible adult stay with you for the time you are told. It is important to have someone help  care for you until you are awake and alert. Rest as told. Do not drive or operate machinery. Do not drink alcohol or take sleeping pills. Get help right away if you have trouble breathing, or if you suddenly become confused. This information is not intended to replace advice given to you by your health care provider. Make sure you discuss any questions you have with your health care provider. Document Revised: 11/23/2020 Document Reviewed: 11/21/2018 Elsevier Patient Education  [place].

## 2021-08-08 ENCOUNTER — Encounter (HOSPITAL_COMMUNITY)
Admission: RE | Admit: 2021-08-08 | Discharge: 2021-08-08 | Disposition: A | Discharge status: Home / Self Care | Payer: Medicare Other | Source: Ambulatory Visit | Attending: Internal Medicine | Admitting: Internal Medicine

## 2021-08-08 ENCOUNTER — Encounter (HOSPITAL_COMMUNITY): Payer: Self-pay

## 2021-08-08 VITALS — BP 132/50 | HR 74 | Temp 97.6°F | Resp 18 | Ht 68.0 in | Wt 331.2 lb

## 2021-08-08 DIAGNOSIS — E1169 Type 2 diabetes mellitus with other specified complication: Secondary | ICD-10-CM | POA: Insufficient documentation

## 2021-08-08 DIAGNOSIS — Z01812 Encounter for preprocedural laboratory examination: Secondary | ICD-10-CM | POA: Insufficient documentation

## 2021-08-08 DIAGNOSIS — Z01818 Encounter for other preprocedural examination: Secondary | ICD-10-CM

## 2021-08-08 HISTORY — DX: Other complications of anesthesia, initial encounter: T88.59XA

## 2021-08-09 DIAGNOSIS — L97521 Non-pressure chronic ulcer of other part of left foot limited to breakdown of skin: Secondary | ICD-10-CM | POA: Diagnosis not present

## 2021-08-11 ENCOUNTER — Ambulatory Visit (HOSPITAL_COMMUNITY): Payer: Medicare Other | Admitting: Anesthesiology

## 2021-08-11 ENCOUNTER — Ambulatory Visit (HOSPITAL_COMMUNITY)
Admission: RE | Admit: 2021-08-11 | Discharge: 2021-08-11 | Disposition: A | Discharge status: Home / Self Care | Payer: Medicare Other | Attending: Internal Medicine | Admitting: Internal Medicine

## 2021-08-11 ENCOUNTER — Ambulatory Visit (HOSPITAL_BASED_OUTPATIENT_CLINIC_OR_DEPARTMENT_OTHER): Payer: Medicare Other | Admitting: Anesthesiology

## 2021-08-11 ENCOUNTER — Encounter (HOSPITAL_COMMUNITY): Payer: Self-pay | Admitting: Internal Medicine

## 2021-08-11 ENCOUNTER — Encounter (HOSPITAL_COMMUNITY)
Admission: RE | Disposition: A | Discharge status: Home / Self Care | Payer: Self-pay | Source: Home / Self Care | Attending: Internal Medicine

## 2021-08-11 DIAGNOSIS — K635 Polyp of colon: Secondary | ICD-10-CM

## 2021-08-11 DIAGNOSIS — Z794 Long term (current) use of insulin: Secondary | ICD-10-CM | POA: Insufficient documentation

## 2021-08-11 DIAGNOSIS — D123 Benign neoplasm of transverse colon: Secondary | ICD-10-CM | POA: Diagnosis not present

## 2021-08-11 DIAGNOSIS — M797 Fibromyalgia: Secondary | ICD-10-CM | POA: Insufficient documentation

## 2021-08-11 DIAGNOSIS — J449 Chronic obstructive pulmonary disease, unspecified: Secondary | ICD-10-CM

## 2021-08-11 DIAGNOSIS — Z9981 Dependence on supplemental oxygen: Secondary | ICD-10-CM | POA: Insufficient documentation

## 2021-08-11 DIAGNOSIS — M1990 Unspecified osteoarthritis, unspecified site: Secondary | ICD-10-CM | POA: Diagnosis not present

## 2021-08-11 DIAGNOSIS — Z79899 Other long term (current) drug therapy: Secondary | ICD-10-CM | POA: Insufficient documentation

## 2021-08-11 DIAGNOSIS — Z86010 Personal history of colonic polyps: Secondary | ICD-10-CM

## 2021-08-11 DIAGNOSIS — I4891 Unspecified atrial fibrillation: Secondary | ICD-10-CM | POA: Diagnosis not present

## 2021-08-11 DIAGNOSIS — E1143 Type 2 diabetes mellitus with diabetic autonomic (poly)neuropathy: Secondary | ICD-10-CM | POA: Insufficient documentation

## 2021-08-11 DIAGNOSIS — D175 Benign lipomatous neoplasm of intra-abdominal organs: Secondary | ICD-10-CM | POA: Diagnosis not present

## 2021-08-11 DIAGNOSIS — F1120 Opioid dependence, uncomplicated: Secondary | ICD-10-CM | POA: Diagnosis not present

## 2021-08-11 DIAGNOSIS — G4730 Sleep apnea, unspecified: Secondary | ICD-10-CM | POA: Insufficient documentation

## 2021-08-11 DIAGNOSIS — Z1211 Encounter for screening for malignant neoplasm of colon: Secondary | ICD-10-CM | POA: Diagnosis not present

## 2021-08-11 DIAGNOSIS — K219 Gastro-esophageal reflux disease without esophagitis: Secondary | ICD-10-CM | POA: Diagnosis not present

## 2021-08-11 DIAGNOSIS — F32A Depression, unspecified: Secondary | ICD-10-CM | POA: Insufficient documentation

## 2021-08-11 DIAGNOSIS — Q438 Other specified congenital malformations of intestine: Secondary | ICD-10-CM | POA: Diagnosis not present

## 2021-08-11 DIAGNOSIS — K3184 Gastroparesis: Secondary | ICD-10-CM | POA: Diagnosis not present

## 2021-08-11 DIAGNOSIS — Z7985 Long-term (current) use of injectable non-insulin antidiabetic drugs: Secondary | ICD-10-CM | POA: Insufficient documentation

## 2021-08-11 DIAGNOSIS — Z09 Encounter for follow-up examination after completed treatment for conditions other than malignant neoplasm: Secondary | ICD-10-CM | POA: Diagnosis not present

## 2021-08-11 DIAGNOSIS — Z87891 Personal history of nicotine dependence: Secondary | ICD-10-CM | POA: Diagnosis not present

## 2021-08-11 DIAGNOSIS — Z7984 Long term (current) use of oral hypoglycemic drugs: Secondary | ICD-10-CM | POA: Insufficient documentation

## 2021-08-11 DIAGNOSIS — Z6841 Body Mass Index (BMI) 40.0 and over, adult: Secondary | ICD-10-CM | POA: Insufficient documentation

## 2021-08-11 DIAGNOSIS — I10 Essential (primary) hypertension: Secondary | ICD-10-CM | POA: Insufficient documentation

## 2021-08-11 HISTORY — PX: COLONOSCOPY WITH PROPOFOL: SHX5780

## 2021-08-11 HISTORY — PX: POLYPECTOMY: SHX149

## 2021-08-11 LAB — POCT I-STAT, CHEM 8
BUN: 8 mg/dL (ref 6–20)
Calcium, Ion: 1 mmol/L — ABNORMAL LOW (ref 1.15–1.40)
Chloride: 103 mmol/L (ref 98–111)
Creatinine, Ser: 0.6 mg/dL (ref 0.44–1.00)
Glucose, Bld: 119 mg/dL — ABNORMAL HIGH (ref 70–99)
HCT: 34 % — ABNORMAL LOW (ref 36.0–46.0)
Hemoglobin: 11.6 g/dL — ABNORMAL LOW (ref 12.0–15.0)
Potassium: 4 mmol/L (ref 3.5–5.1)
Sodium: 137 mmol/L (ref 135–145)
TCO2: 22 mmol/L (ref 22–32)

## 2021-08-11 SURGERY — COLONOSCOPY WITH PROPOFOL
Anesthesia: General

## 2021-08-11 MED ORDER — PROPOFOL 10 MG/ML IV BOLUS
INTRAVENOUS | Status: AC
  Filled 2021-08-11: qty 20

## 2021-08-11 MED ORDER — PROPOFOL 500 MG/50ML IV EMUL
INTRAVENOUS | Status: DC | PRN
  Administered 2021-08-11: 100 ug/kg/min via INTRAVENOUS

## 2021-08-11 MED ORDER — LACTATED RINGERS IV SOLN: INTRAVENOUS | Status: DC | PRN

## 2021-08-11 MED ORDER — LIDOCAINE HCL (CARDIAC) PF 100 MG/5ML IV SOSY
PREFILLED_SYRINGE | INTRAVENOUS | Status: DC | PRN
  Administered 2021-08-11: 60 mg via INTRATRACHEAL

## 2021-08-11 MED ORDER — LACTATED RINGERS IV SOLN: INTRAVENOUS | Status: DC

## 2021-08-11 MED ORDER — PROPOFOL 10 MG/ML IV BOLUS
INTRAVENOUS | Status: DC | PRN
  Administered 2021-08-11: 30 mg via INTRAVENOUS
  Administered 2021-08-11: 20 mg via INTRAVENOUS
  Administered 2021-08-11: 30 mg via INTRAVENOUS
  Administered 2021-08-11 (×3): 20 mg via INTRAVENOUS

## 2021-08-11 MED ORDER — PROPOFOL 500 MG/50ML IV EMUL
INTRAVENOUS | Status: AC
  Filled 2021-08-11: qty 50

## 2021-08-11 NOTE — H&P (Signed)
$'@LOGO'Z$ @   Primary Care Physician:  Glenda Chroman, MD Primary Gastroenterologist:  Dr.   Pre-Procedure History & Physical: HPI:  [name] [name] is a 61 y.o. female here for Surveillance colonoscopy.  History of colonic adenomas removed in the past.   No GI symptoms at this time.  Xarelto held 3 days ago.  Past Medical History:  Diagnosis Date   Bladder dysfunction    Self urinary catheterization   Complication of anesthesia    COPD (chronic obstructive pulmonary disease) (HCC)    DDD (degenerative disc disease)    Depression    Esophageal spasm    NTG and Norvasc   Essential hypertension    Fibromyalgia    Gastroparesis    GERD (gastroesophageal reflux disease)    History of cardiac catheterization    Normal coronary arteries   History of pituitary tumor    Irritable bowel syndrome    Lymphedema    Osteoarthritis    PAF (paroxysmal atrial fibrillation) (HCC)    PONV (postoperative nausea and vomiting)    Sleep apnea    BIPAP   Type 2 diabetes mellitus ([place])    Urinary tract infection     Past Surgical History:  Procedure Laterality Date   ABDOMINAL HYSTERECTOMY     ANTERIOR CERVICAL DECOMP/DISCECTOMY FUSION N/A 12/02/2014   Procedure: Cervical five cervical six anterior cervical decompression with fusion interbody prosthesis plating and bone graft;  Surgeon: Newman Pies, MD;  Location: [facility];  Service: Neurosurgery;  Laterality: N/A;  C56 anterior cervical decompression with fusion interbody prosthesis plating and bonegraft   APPENDECTOMY     BACK SURGERY     CARDIAC CATHETERIZATION     CARDIOVERSION N/A 03/26/2014   Procedure: CARDIOVERSION;  Surgeon: Herminio Commons, MD;  Location: [facility];  Service: Endoscopy;  Laterality: N/A;   CARDIOVERSION N/A 05/04/2014   Procedure: CARDIOVERSION;  Surgeon: Satira Sark, MD;  Location: [facility];  Service: Cardiovascular;  Laterality: N/A;   CHOLECYSTECTOMY     COLONOSCOPY     Approximately 2003. Per medical  records, internal hemorrhoids noted   COLONOSCOPY N/A 06/25/2013   Dr. Gala Romney: Anal canal hemorrhoids-more likely the source of paper hematochezia. Redundant, capacious colon. Multiple colonic polyps-tubular adenoma   COLONOSCOPY WITH PROPOFOL N/A 12/04/2016   Dr. Gala Romney: redundant colon, colonic diverticulosis. Screening due in 2023   ENDOVENOUS ABLATION SAPHENOUS VEIN W/ LASER Left 03/07/2018   endovenous ablation left greater saphenous vein by Deitra Mayo MD    ESOPHAGOGASTRODUODENOSCOPY  10/12/2004   GYK:ZLDJ plaquing on the esophageal mucosa of uncertain significance, not typical of what is seen with candida esophagitis status post KOH brushing for KOH prep and biopsy for histology. Rule out candida esophagitis/eosinophilic esophagitis. Otherwise normal esophagus. Tiny hiatal hernia. Otherwise, normal stomach, normal D1 and D2. Benign biopsy of esophagus, unknown KOH status.    ESOPHAGOGASTRODUODENOSCOPY N/A 06/25/2013   Dr. Latiesha Harada:mild chronic gastritis   KNEE SURGERY     LEFT HEART CATHETERIZATION WITH CORONARY ANGIOGRAM N/A 11/15/2010   Procedure: LEFT HEART CATHETERIZATION WITH CORONARY ANGIOGRAM;  Surgeon: Laverda Page, MD;  Location: [facility] C[facility];  Service: Cardiovascular;  Laterality: N/A;   LUNG BIOPSY     RIGHT/LEFT HEART CATH AND CORONARY ANGIOGRAPHY N/A 08/28/2018   Procedure: RIGHT/LEFT HEART CATH AND CORONARY ANGIOGRAPHY;  Surgeon: Burnell Blanks, MD;  Location: [place] CV LAB;  Service: Cardiovascular;  Laterality: N/A;    Prior to Admission medications   Medication Sig Start Date End Date  Taking? Authorizing Provider  albuterol (PROVENTIL) (2.5 MG/3ML) 0.083% nebulizer solution Take 2.5 mg by nebulization every 6 (six) hours as needed for wheezing. For shortness of breath.   Yes [provider]  albuterol (VENTOLIN HFA) 108 (90 Base) MCG/ACT inhaler Inhale 2 puffs into the lungs every 6 (six) hours as needed for wheezing or shortness of breath.  03/09/21  Yes Rigoberto Noel, MD  ANORO ELLIPTA 62.5-25 MCG/ACT AEPB INHALE 1 PUFF BY MOUTH EVERY DAY 06/21/21  Yes Rigoberto Noel, MD  atorvastatin (LIPITOR) 40 MG tablet Take 40 mg by mouth daily. 07/06/20  Yes [provider]  baclofen (LIORESAL) 20 MG tablet Take 20 mg by mouth 3 (three) times daily.   Yes [provider]  buPROPion (WELLBUTRIN) 75 MG tablet Take 1 tablet (75 mg total) by mouth daily. 06/01/15 07/22/22 Yes Cloria Spring, MD  DEXILANT 60 MG capsule TAKE ONE CAPSULE BY MOUTH EVERY DAY 12/28/20  Yes Annitta Needs, NP  diclofenac sodium (VOLTAREN) 1 % GEL Apply 2-4 g topically 4 (four) times daily as needed (for pain).    Yes [provider]  diclofenac Sodium (VOLTAREN) 1 % GEL Apply 2 g topically 4 (four) times daily as needed for pain. 06/30/21  Yes [provider]  diltiazem (CARDIZEM CD) 120 MG 24 hr capsule TAKE ONE CAPSULE BY MOUTH EVERY DAY 09/27/20  Yes Satira Sark, MD  docusate sodium (COLACE) 100 MG capsule Take 100 mg by mouth 2 (two) times daily.   Yes [provider]  famotidine (PEPCID) 20 MG tablet TAKE 1 TABLET BY MOUTH EVERY DAY AS NEEDED FOR HEARTBURN OR INDIGESTION 10/09/18  Yes Carlis Stable, NP  flecainide (TAMBOCOR) 50 MG tablet TAKE 2 TABLETS BY MOUTH EVERY MORNING and TAKE 2 TABLETS BY MOUTH EVERY EVENING 02/23/21  Yes Satira Sark, MD  fluticasone [facility]) 50 MCG/ACT nasal spray INSTILL 2 SPRAYS IN EACH NOSTRIL EVERY DAY 06/21/21  Yes Rigoberto Noel, MD  furosemide (LASIX) 80 MG tablet Take 80 mg by mouth 2 (two) times daily.   Yes [provider]  gabapentin (NEURONTIN) 800 MG tablet Take 800 mg by mouth 4 (four) times daily.  10/09/17  Yes [provider]  glimepiride (AMARYL) 4 MG tablet Take 4 mg by mouth 2 (two) times daily. 08/16/18  Yes [provider]  HUMIRA PEN 40 MG/0.4ML PNKT Inject 40 mg into the skin every 14 (fourteen) days. 12/29/20  Yes [provider]   hydrocortisone (ANUSOL-HC) 2.5 % rectal cream Place 1 application rectally 2 (two) times daily. Patient taking differently: Place 1 application  rectally 2 (two) times daily as needed for hemorrhoids. 10/24/17  Yes Annitta Needs, NP  isosorbide mononitrate (IMDUR) 120 MG 24 hr tablet Take 120 mg by mouth daily. In the morning   Yes [provider]  isosorbide mononitrate (IMDUR) 30 MG 24 hr tablet Take 30 mg by mouth every evening.   Yes [provider]  LINZESS 290 MCG CAPS capsule TAKE ONE CAPSULE BY MOUTH EVERY DAY BEFORE BREAKFAST - REPLACES [place] 04/25/21  Yes Annitta Needs, NP  lisinopril (PRINIVIL,ZESTRIL) 10 MG tablet Take 10 mg by mouth every evening.    Yes [provider]  loratadine (CLARITIN) 10 MG tablet Take 1 tablet (10 mg total) by mouth daily. 11/25/19  Yes Rigoberto Noel, MD  meclizine (ANTIVERT) 25 MG tablet Take 25 mg by mouth 3 (three) times daily. 07/31/13  Yes [provider]  metFORMIN (GLUCOPHAGE) 500 MG tablet Take 1,000 mg by mouth 2 (two) times daily.  05/18/13  Yes [provider]  metolazone (ZAROXOLYN) 2.5 MG tablet Take 5 mg by mouth daily with breakfast.   Yes [provider]  MOTEGRITY 2 MG TABS TAKE 1 TABLET BY MOUTH DAILY 06/21/21  Yes Annitta Needs, NP  Multiple Vitamin (MULTIVITAMIN WITH MINERALS) TABS tablet Take 1 tablet by mouth daily.   Yes [provider]  mupirocin ointment (BACTROBAN) 2 % Apply 1 Application topically in the morning and at bedtime. 06/30/21  Yes [provider]  nitroGLYCERIN (NITROLINGUAL) 0.4 MG/SPRAY spray USE 1 SPRAY UNDER THE TONGUE EVERY 5 MINUTES UP TO 3 DOSES AS NEEDED FOR CHEST PAIN. GO TO ER IF NOT RESOLVED 02/07/21  Yes Satira Sark, MD  NUCYNTA ER 100 MG 12 hr tablet Take 100 mg by mouth 2 (two) times daily. 12/04/19  Yes [provider]  nystatin (MYCOSTATIN/NYSTOP) 100000 UNIT/GM POWD Apply 1 g topically daily.   Yes [provider]   ondansetron (ZOFRAN) 4 MG tablet TAKE 1 TABLET BY MOUTH EVERY 8 HOURS AS NEEDED FOR NAUSEA AND VOMITING 06/01/21  Yes Annitta Needs, NP  oxyCODONE-acetaminophen (PERCOCET) 10-325 MG per tablet Take 1 tablet by mouth every 4 (four) hours as needed for pain.   Yes [provider]  OXYGEN Inhale 2 L into the lungs as needed (shortness of breath).   Yes [provider]  OZEMPIC, 1 MG/DOSE, 4 MG/3ML SOPN Inject 1 mg into the skin every Tuesday. 07/28/21  Yes [provider]  polyethylene glycol powder (GLYCOLAX/MIRALAX) powder Take 51 g by mouth daily. 08/25/13  Yes [provider]  polyethylene glycol-electrolytes (NULYTELY) 420 g solution As directed 06/10/21  Yes Falesha Schommer, Cristopher Estimable, MD  potassium chloride SA (K-DUR,KLOR-CON) 20 MEQ tablet Take 40 mEq by mouth 5 (five) times daily.   Yes [provider]  rivaroxaban (XARELTO) 20 MG TABS tablet Take 20 mg by mouth daily with supper.    Yes [provider]  sodium chloride (OCEAN) 0.65 % SOLN nasal spray Place 1 spray into both nostrils as needed for congestion.   Yes [provider]  spironolactone (ALDACTONE) 50 MG tablet Take 50 mg by mouth 3 (three) times daily.   Yes [provider]  TOUJEO SOLOSTAR 300 UNIT/ML Solostar Pen Inject 46 Units into the skin at bedtime. 11/12/19  Yes [provider]  VASCEPA 1 g CAPS Take 1 g by mouth 4 (four) times daily. 08/16/18  Yes [provider]  Semaglutide,0.25 or 0.'5MG'$ /DOS, 2 MG/1.5ML SOPN Inject 0.25 mg into the skin every Tuesday. At lunch Patient not taking: Reported on 08/03/2021    [provider]    Allergies as of 06/10/2021 - Review Complete 05/25/2021  Allergen Reaction Noted   Tape Other (See Comments) 11/21/2016   Hyoscyamine sulfate Other (See Comments)    Metoclopramide Other (See Comments) 08/17/2011    Family History  Problem Relation Age of Onset   Coronary artery disease Father        Died age 33    Heart attack Father    Arrhythmia Father        AF   Diabetes Father    Parkinson's disease Father    Arrhythmia Mother        AF   Stroke Mother    Dementia Mother    Cancer Mother        UTERINE    Parkinson's disease  Mother    Arrhythmia Brother        AF   Colon cancer Maternal Grandfather    Colon cancer Paternal Aunt    Colon cancer Paternal 70    Diabetes Son    Cancer Sister        BREAST    Depression Sister    Breast cancer Sister    Depression Sister    Depression Sister    Breast cancer Maternal Aunt     Social History   Socioeconomic History   Marital status: Divorced    Spouse name: Not on file   Number of children: Not on file   Years of education: Not on file   Highest education level: Not on file  Occupational History   Not on file  Tobacco Use   Smoking status: Former    Packs/day: 1.50    Years: 30.00    Total pack years: 45.00    Types: Cigarettes    Start date: 03/06/1977    Quit date: 01/03/2007    Years since quitting: 14.6   Smokeless tobacco: Never  Vaping Use   Vaping Use: Never used  Substance and Sexual Activity   Alcohol use: No    Alcohol/week: 0.0 standard drinks of alcohol   Drug use: No   Sexual activity: Not Currently  Other Topics Concern   Not on file  Social History Narrative   Not on file   Social Determinants of Health   Financial Resource Strain: Not on file  Food Insecurity: Not on file  Transportation Needs: Not on file  Physical Activity: Not on file  Stress: Not on file  Social Connections: Not on file  Intimate Partner Violence: Not on file    Review of Systems: See HPI, otherwise negative ROS  Physical Exam: BP 129/73   Pulse (P) 82   Temp (P) 98.2 F (36.8 C) (Oral)   Resp (P) 16   Ht (P) '5\' 8"'$  (1.727 m)   Wt (!) (P) 149.7 kg   SpO2 93%   BMI (P) 50.18 kg/m  General:   Alert,  Well-developed, well-nourished, pleasant and cooperative in NAD Neck:  Supple; no masses or thyromegaly. No  significant cervical adenopathy. Lungs:  Clear throughout to auscultation.   No wheezes, crackles, or rhonchi. No acute distress. Heart:  Regular rate and rhythm; no murmurs, clicks, rubs,  or gallops. Abdomen: Non-distended, normal bowel sounds.  Soft and nontender without appreciable mass or hepatosplenomegaly.  Pulses:  Normal pulses noted. Extremities:  Without clubbing or edema.  Impression/Plan:    61 year old lady here with a personal history colonic adenoma.  Here for surveillance colonoscopy per plan.  The risks, benefits, limitations, alternatives and imponderables have been reviewed with the patient. Questions have been answered. All parties are agreeable.      Notice: This dictation was prepared with Dragon dictation along with smaller phrase technology. Any transcriptional errors that result from this process are unintentional and may not be corrected upon review.

## 2021-08-11 NOTE — Anesthesia Preprocedure Evaluation (Signed)
Anesthesia Evaluation  Patient identified by MRN, date of birth, ID band Patient awake    Reviewed: Allergy & Precautions, NPO status , Patient's Chart, lab work & pertinent test results  History of Anesthesia Complications (+) PONV and history of anesthetic complications  Airway Mallampati: II  TM Distance: >3 FB Neck ROM: Full   Comment: ACDF Dental  (+) Dental Advisory Given, Missing, Poor Dentition, Loose,    Pulmonary sleep apnea and Continuous Positive Airway Pressure Ventilation , COPD (OXYGEN AS NEEDED),  COPD inhaler and oxygen dependent, former smoker,    Pulmonary exam normal breath sounds clear to auscultation       Cardiovascular METS: 3 - Mets hypertension, Pt. on medications Normal cardiovascular exam+ dysrhythmias Atrial Fibrillation  Rhythm:Regular Rate:Normal     Neuro/Psych PSYCHIATRIC DISORDERS Depression  Neuromuscular disease    GI/Hepatic Neg liver ROS, GERD (gastroparesis )  Medicated and Controlled,  Endo/Other  diabetes, Well Controlled, Type 2, Oral Hypoglycemic Agents, Insulin DependentMorbid obesity  Renal/GU negative Renal ROS  negative genitourinary   Musculoskeletal  (+) Arthritis , Fibromyalgia -, narcotic dependent  Abdominal (+) + obese,   Peds negative pediatric ROS (+)  Hematology negative hematology ROS (+)   Anesthesia Other Findings ACDF Last dose of Ozempic 8 days ago  Reproductive/Obstetrics negative OB ROS                            Anesthesia Physical Anesthesia Plan  ASA: 3  Anesthesia Plan: General   Post-op Pain Management: Minimal or no pain anticipated   Induction: Intravenous  PONV Risk Score and Plan: Propofol infusion and TIVA  Airway Management Planned: Nasal Cannula and Natural Airway  Additional Equipment:   Intra-op Plan:   Post-operative Plan:   Informed Consent: I have reviewed the patients History and Physical,  chart, labs and discussed the procedure including the risks, benefits and alternatives for the proposed anesthesia with the patient or authorized representative who has indicated his/her understanding and acceptance.     Dental advisory given  Plan Discussed with: CRNA and Surgeon  Anesthesia Plan Comments:         Anesthesia Quick Evaluation

## 2021-08-11 NOTE — Anesthesia Postprocedure Evaluation (Signed)
Anesthesia Post Note  Patient: [name] [name]  Procedure(s) Performed: COLONOSCOPY WITH PROPOFOL POLYPECTOMY INTESTINAL  Patient location during evaluation: Phase II Anesthesia Type: General Level of consciousness: awake and alert and oriented Pain management: pain level controlled Vital Signs Assessment: post-procedure vital signs reviewed and stable Respiratory status: spontaneous breathing, nonlabored ventilation and respiratory function stable Cardiovascular status: blood pressure returned to baseline and stable Postop Assessment: no apparent nausea or vomiting Anesthetic complications: no   No notable events documented.   Last Vitals:  Vitals:   08/11/21 1143 08/11/21 1317  BP: 129/73 99/63  Pulse:  72  Resp:  16  Temp:  36.8 C  SpO2: 93% 97%    Last Pain:  Vitals:   08/11/21 1317  TempSrc: Oral  PainSc: 0-No pain                 Anibal Quinby C Kaizley Aja

## 2021-08-11 NOTE — Discharge Instructions (Signed)
  Colonoscopy Discharge Instructions  Read the instructions outlined below and refer to this sheet in the next few weeks. These discharge instructions provide you with general information on caring for yourself after you leave the hospital. Your doctor may also give you specific instructions. While your treatment has been planned according to the most current medical practices available, unavoidable complications occasionally occur. If you have any problems or questions after discharge, call Dr. Gala Romney at [phone]. ACTIVITY You may resume your regular activity, but move at a slower pace for the next 24 hours.  Take frequent rest periods for the next 24 hours.  Walking will help get rid of the air and reduce the bloated feeling in your belly (abdomen).  No driving for 24 hours (because of the medicine (anesthesia) used during the test).   Do not sign any important legal documents or operate any machinery for 24 hours (because of the anesthesia used during the test).  NUTRITION Drink plenty of fluids.  You may resume your normal diet as instructed by your doctor.  Begin with a light meal and progress to your normal diet. Heavy or fried foods are harder to digest and may make you feel sick to your stomach (nauseated).  Avoid alcoholic beverages for 24 hours or as instructed.  MEDICATIONS You may resume your normal medications unless your doctor tells you otherwise.  WHAT YOU CAN EXPECT TODAY Some feelings of bloating in the abdomen.  Passage of more gas than usual.  Spotting of blood in your stool or on the toilet paper.  IF YOU HAD POLYPS REMOVED DURING THE COLONOSCOPY: No aspirin products for 7 days or as instructed.  No alcohol for 7 days or as instructed.  Eat a soft diet for the next 24 hours.  FINDING OUT THE RESULTS OF YOUR TEST Not all test results are available during your visit. If your test results are not back during the visit, make an appointment with your caregiver to find out the  results. Do not assume everything is normal if you have not heard from your caregiver or the medical facility. It is important for you to follow up on all of your test results.  SEEK IMMEDIATE MEDICAL ATTENTION IF: You have more than a spotting of blood in your stool.  Your belly is swollen (abdominal distention).  You are nauseated or vomiting.  You have a temperature over 101.  You have abdominal pain or discomfort that is severe or gets worse throughout the day.       3 small polyps removed from your colon today   further recommendations to follow pending review of pathology report   Resume Xarelto today   at patient request, I called Wynema Birch at [phone] -  reviewed findings and recommendations

## 2021-08-11 NOTE — Op Note (Signed)
[facility] Patient Name: [name] [name] Procedure Date: 08/11/2021 11:43 AM MRN: [id number] Date of Birth: [birth year] Attending MD: Norvel Richards , MD CSN: [id number] Age: 61 Admit Type: Outpatient Procedure:                Colonoscopy Indications:              High risk colon cancer surveillance: Personal                            history of colonic polyps Providers:                Norvel Richards, MD, Lambert Mody,                            Raphael Gibney, Technician Referring MD:              Medicines:                Propofol per Anesthesia Complications:            No immediate complications. Estimated Blood Loss:     Estimated blood loss was minimal. Procedure:                Pre-Anesthesia Assessment:                           - Prior to the procedure, a History and Physical                            was performed, and patient medications and                            allergies were reviewed. The patient's tolerance of                            previous anesthesia was also reviewed. The risks                            and benefits of the procedure and the sedation                            options and risks were discussed with the patient.                            All questions were answered, and informed consent                            was obtained. Prior Anticoagulants: The patient has                            taken no previous anticoagulant or antiplatelet                            agents. ASA Grade Assessment: III - A patient with  severe systemic disease. After reviewing the risks                            and benefits, the patient was deemed in                            satisfactory condition to undergo the procedure.                           After obtaining informed consent, the colonoscope                            was passed under direct vision. Throughout the                            procedure, the  patient's blood pressure, pulse, and                            oxygen saturations were monitored continuously. The                            [id number] scope was introduced through the                            anus and advanced to the the cecum, identified by                            appendiceal orifice and ileocecal valve. The                            colonoscopy was performed without difficulty. The                            patient tolerated the procedure well. The quality                            of the bowel preparation was adequate. Scope In: 12:45:20 PM Scope Out: 1:11:11 PM Scope Withdrawal Time: 0 hours 9 minutes 43 seconds  Total Procedure Duration: 0 hours 25 minutes 51 seconds  Findings:      The perianal and digital rectal examinations were normal.      Three sessile polyps were found in the descending colon and hepatic       flexure. The polyps were 4 to 5 mm in size. These polyps were removed       with a cold snare. Resection and retrieval were complete. Estimated       blood loss was minimal. 1 cm yellowish submucosal nodule ascending       segment. Positive pillow sign. Consistent with a lipoma. Markedly       redundant and elongated colon. External abdominal pressure required to       reach the cecum.      The exam was otherwise without abnormality on direct and retroflexion       views. Impression:               - Three 4 to  5 mm polyps in the descending colon                            and at the hepatic flexure, removed with a cold                            snare. Resected and retrieved. Colonic lipoma.                            Redundant and elongated colon.                           - The examination was otherwise normal on direct                            and retroflexion views. Moderate Sedation:      Moderate (conscious) sedation was personally administered by an       anesthesia professional. The following parameters were monitored:  oxygen       saturation, heart rate, blood pressure, respiratory rate, EKG, adequacy       of pulmonary ventilation, and response to care. Recommendation:           - Patient has a contact number available for                            emergencies. The signs and symptoms of potential                            delayed complications were discussed with the                            patient. Return to normal activities tomorrow.                            Written discharge instructions were provided to the                            patient.                           - Resume previous diet.                           - Continue present medications.                           - Await pathology results.                           - Repeat colonoscopy date to be determined after                            pending pathology results are reviewed for                            surveillance.                           -  Return to GI office (date not yet determined). Procedure Code(s):        --- Professional ---                           [phone], Colonoscopy, flexible; with removal of                            tumor(s), polyp(s), or other lesion(s) by snare                            technique Diagnosis Code(s):        --- Professional ---                           Z86.010, Personal history of colonic polyps                           K63.5, Polyp of colon CPT copyright 2019 [employer]. All rights reserved. The codes documented in this report are preliminary and upon coder review may  be revised to meet current compliance requirements. Cristopher Estimable. Abigail Marsiglia, MD Norvel Richards, MD 08/11/2021 1:17:56 PM This report has been signed electronically. Number of Addenda: 0

## 2021-08-11 NOTE — Transfer of Care (Signed)
Immediate Anesthesia Transfer of Care Note  Patient: [name] [name]  Procedure(s) Performed: COLONOSCOPY WITH PROPOFOL POLYPECTOMY INTESTINAL  Patient Location: PACU  Anesthesia Type:MAC  Level of Consciousness: sedated  Airway & Oxygen Therapy: Patient Spontanous Breathing  Post-op Assessment: Report given to RN and Post -op Vital signs reviewed and stable  Post vital signs: Reviewed and stable  Last Vitals:  Vitals Value Taken Time  BP 103/65   Temp 98.2   Pulse 72   Resp 16   SpO2 96     Last Pain:  Vitals:   08/11/21 1239  TempSrc:   PainSc: 7          Complications: No notable events documented.

## 2021-08-15 LAB — SURGICAL PATHOLOGY

## 2021-08-16 ENCOUNTER — Encounter (HOSPITAL_COMMUNITY): Payer: Self-pay | Admitting: Internal Medicine

## 2021-08-16 ENCOUNTER — Encounter: Payer: Self-pay | Admitting: Internal Medicine

## 2021-08-16 DIAGNOSIS — R2681 Unsteadiness on feet: Secondary | ICD-10-CM | POA: Diagnosis not present

## 2021-08-18 DIAGNOSIS — E1165 Type 2 diabetes mellitus with hyperglycemia: Secondary | ICD-10-CM | POA: Diagnosis not present

## 2021-08-18 DIAGNOSIS — I10 Essential (primary) hypertension: Secondary | ICD-10-CM | POA: Diagnosis not present

## 2021-08-18 DIAGNOSIS — L97509 Non-pressure chronic ulcer of other part of unspecified foot with unspecified severity: Secondary | ICD-10-CM | POA: Diagnosis not present

## 2021-08-18 DIAGNOSIS — E11621 Type 2 diabetes mellitus with foot ulcer: Secondary | ICD-10-CM | POA: Diagnosis not present

## 2021-08-18 DIAGNOSIS — Z299 Encounter for prophylactic measures, unspecified: Secondary | ICD-10-CM | POA: Diagnosis not present

## 2021-08-23 DIAGNOSIS — G894 Chronic pain syndrome: Secondary | ICD-10-CM | POA: Diagnosis not present

## 2021-08-23 DIAGNOSIS — E1142 Type 2 diabetes mellitus with diabetic polyneuropathy: Secondary | ICD-10-CM | POA: Diagnosis not present

## 2021-08-23 DIAGNOSIS — M47816 Spondylosis without myelopathy or radiculopathy, lumbar region: Secondary | ICD-10-CM | POA: Diagnosis not present

## 2021-08-23 DIAGNOSIS — M48061 Spinal stenosis, lumbar region without neurogenic claudication: Secondary | ICD-10-CM | POA: Diagnosis not present

## 2021-08-25 DIAGNOSIS — L97521 Non-pressure chronic ulcer of other part of left foot limited to breakdown of skin: Secondary | ICD-10-CM | POA: Diagnosis not present

## 2021-08-29 DIAGNOSIS — E559 Vitamin D deficiency, unspecified: Secondary | ICD-10-CM | POA: Diagnosis not present

## 2021-08-29 DIAGNOSIS — E7800 Pure hypercholesterolemia, unspecified: Secondary | ICD-10-CM | POA: Diagnosis not present

## 2021-08-29 DIAGNOSIS — Z789 Other specified health status: Secondary | ICD-10-CM | POA: Diagnosis not present

## 2021-08-29 DIAGNOSIS — Z79899 Other long term (current) drug therapy: Secondary | ICD-10-CM | POA: Diagnosis not present

## 2021-08-29 DIAGNOSIS — Z7189 Other specified counseling: Secondary | ICD-10-CM | POA: Diagnosis not present

## 2021-08-29 DIAGNOSIS — E538 Deficiency of other specified B group vitamins: Secondary | ICD-10-CM | POA: Diagnosis not present

## 2021-08-29 DIAGNOSIS — Z0000 Encounter for general adult medical examination without abnormal findings: Secondary | ICD-10-CM | POA: Diagnosis not present

## 2021-08-29 DIAGNOSIS — Z299 Encounter for prophylactic measures, unspecified: Secondary | ICD-10-CM | POA: Diagnosis not present

## 2021-08-29 DIAGNOSIS — R5383 Other fatigue: Secondary | ICD-10-CM | POA: Diagnosis not present

## 2021-08-29 DIAGNOSIS — I10 Essential (primary) hypertension: Secondary | ICD-10-CM | POA: Diagnosis not present

## 2021-08-31 DIAGNOSIS — I5032 Chronic diastolic (congestive) heart failure: Secondary | ICD-10-CM | POA: Diagnosis not present

## 2021-08-31 DIAGNOSIS — I4891 Unspecified atrial fibrillation: Secondary | ICD-10-CM | POA: Diagnosis not present

## 2021-08-31 DIAGNOSIS — Z299 Encounter for prophylactic measures, unspecified: Secondary | ICD-10-CM | POA: Diagnosis not present

## 2021-08-31 DIAGNOSIS — I10 Essential (primary) hypertension: Secondary | ICD-10-CM | POA: Diagnosis not present

## 2021-08-31 DIAGNOSIS — E1165 Type 2 diabetes mellitus with hyperglycemia: Secondary | ICD-10-CM | POA: Diagnosis not present

## 2021-08-31 DIAGNOSIS — M069 Rheumatoid arthritis, unspecified: Secondary | ICD-10-CM | POA: Diagnosis not present

## 2021-09-01 DIAGNOSIS — E1165 Type 2 diabetes mellitus with hyperglycemia: Secondary | ICD-10-CM | POA: Diagnosis not present

## 2021-09-01 DIAGNOSIS — M797 Fibromyalgia: Secondary | ICD-10-CM | POA: Diagnosis not present

## 2021-09-01 DIAGNOSIS — I4891 Unspecified atrial fibrillation: Secondary | ICD-10-CM | POA: Diagnosis not present

## 2021-09-08 DIAGNOSIS — L97521 Non-pressure chronic ulcer of other part of left foot limited to breakdown of skin: Secondary | ICD-10-CM | POA: Diagnosis not present

## 2021-09-13 DIAGNOSIS — M545 Low back pain, unspecified: Secondary | ICD-10-CM | POA: Diagnosis not present

## 2021-09-13 DIAGNOSIS — M25561 Pain in right knee: Secondary | ICD-10-CM | POA: Diagnosis not present

## 2021-09-13 DIAGNOSIS — R293 Abnormal posture: Secondary | ICD-10-CM | POA: Diagnosis not present

## 2021-09-13 DIAGNOSIS — M25512 Pain in left shoulder: Secondary | ICD-10-CM | POA: Diagnosis not present

## 2021-09-13 DIAGNOSIS — M6281 Muscle weakness (generalized): Secondary | ICD-10-CM | POA: Diagnosis not present

## 2021-09-13 DIAGNOSIS — M25579 Pain in unspecified ankle and joints of unspecified foot: Secondary | ICD-10-CM | POA: Diagnosis not present

## 2021-09-13 DIAGNOSIS — M25511 Pain in right shoulder: Secondary | ICD-10-CM | POA: Diagnosis not present

## 2021-09-13 DIAGNOSIS — M25562 Pain in left knee: Secondary | ICD-10-CM | POA: Diagnosis not present

## 2021-09-15 DIAGNOSIS — Z87891 Personal history of nicotine dependence: Secondary | ICD-10-CM | POA: Diagnosis not present

## 2021-09-15 DIAGNOSIS — Z0000 Encounter for general adult medical examination without abnormal findings: Secondary | ICD-10-CM | POA: Diagnosis not present

## 2021-09-15 DIAGNOSIS — Z23 Encounter for immunization: Secondary | ICD-10-CM | POA: Diagnosis not present

## 2021-09-15 DIAGNOSIS — I10 Essential (primary) hypertension: Secondary | ICD-10-CM | POA: Diagnosis not present

## 2021-09-15 DIAGNOSIS — R5383 Other fatigue: Secondary | ICD-10-CM | POA: Diagnosis not present

## 2021-09-15 DIAGNOSIS — Z299 Encounter for prophylactic measures, unspecified: Secondary | ICD-10-CM | POA: Diagnosis not present

## 2021-09-15 DIAGNOSIS — Z79899 Other long term (current) drug therapy: Secondary | ICD-10-CM | POA: Diagnosis not present

## 2021-09-15 DIAGNOSIS — E7800 Pure hypercholesterolemia, unspecified: Secondary | ICD-10-CM | POA: Diagnosis not present

## 2021-09-19 ENCOUNTER — Other Ambulatory Visit: Payer: Self-pay | Admitting: Cardiology

## 2021-09-20 ENCOUNTER — Ambulatory Visit (INDEPENDENT_AMBULATORY_CARE_PROVIDER_SITE_OTHER): Payer: Medicare Other | Admitting: Pulmonary Disease

## 2021-09-20 ENCOUNTER — Encounter: Payer: Self-pay | Admitting: Pulmonary Disease

## 2021-09-20 DIAGNOSIS — G4733 Obstructive sleep apnea (adult) (pediatric): Secondary | ICD-10-CM

## 2021-09-20 DIAGNOSIS — M48061 Spinal stenosis, lumbar region without neurogenic claudication: Secondary | ICD-10-CM | POA: Diagnosis not present

## 2021-09-20 DIAGNOSIS — M47816 Spondylosis without myelopathy or radiculopathy, lumbar region: Secondary | ICD-10-CM | POA: Diagnosis not present

## 2021-09-20 DIAGNOSIS — G894 Chronic pain syndrome: Secondary | ICD-10-CM | POA: Diagnosis not present

## 2021-09-20 DIAGNOSIS — R0609 Other forms of dyspnea: Secondary | ICD-10-CM

## 2021-09-20 DIAGNOSIS — E1142 Type 2 diabetes mellitus with diabetic polyneuropathy: Secondary | ICD-10-CM | POA: Diagnosis not present

## 2021-09-20 NOTE — Patient Instructions (Signed)
   bipap is working well

## 2021-09-20 NOTE — Assessment & Plan Note (Signed)
-   Likely related to deconditioning and obesity. IV obstruction on PFTs but she feels subjectively improved on Anoro hence this was continued

## 2021-09-20 NOTE — Assessment & Plan Note (Signed)
BiPAP download was reviewed which shows excellent control of events on 14/9 with good compliance average more than 5 hours per night, with mild leak. She is very compliant and BiPAP is only helped improve her daytime somnolence and fatigue. Supplies will be renewed for a year  Weight loss encouraged, compliance with goal of at least 4-6 hrs every night is the expectation. Advised against medications with sedative side effects Cautioned against driving when sleepy - understanding that sleepiness will vary on a day to day basis

## 2021-09-20 NOTE — Progress Notes (Signed)
   Subjective:    Patient ID: [name] [name], female    DOB: 02/28/1960, 61 y.o.   MRN: [id number]  HPI  61 yo ex-smoker  for FU of COPD and OSA. -on BiPAP 14/9   PMH -osteoarthritis, morbid obesity, atrial fibrillation on Xarelto, bilateral lower extremity lymphedema , rheumatoid arthritis on Humira, covid infection jan 2022  23-monthfollow-up visit. Breathing is okay, she remains on Anoro and uses albuterol as needed, ambulates with a cane. Her shoulder surgery has been indefinitely postponed.  She is supposed to have dental work but is too expensive with her insurance. She continues on pain medications. Compliant with BiPAP, denies any problems with mask or pressure She takes frequent unplanned naps in a recliner   Significant tests/ events reviewed  PFTs 02/2019 normal, no evidence of airway obstruction   CPAP titration 08/2005 , 295 pounds , + 9 cm 11/2011 BiPAP titration, difficulty tolerating CPAP, IPAP 14 cm, EPAP 9   12/2011 CPAP 9 cm  Review of Systems neg for any significant sore throat, dysphagia, itching, sneezing, nasal congestion or excess/ purulent secretions, fever, chills, sweats, unintended wt loss, pleuritic or exertional cp, hempoptysis, orthopnea pnd or change in chronic leg swelling. Also denies presyncope, palpitations, heartburn, abdominal pain, nausea, vomiting, diarrhea or change in bowel or urinary habits, dysuria,hematuria, rash, arthralgias, visual complaints, headache, numbness weakness or ataxia.     Objective:   Physical Exam   Gen. Pleasant, obese, in no distress ENT - no lesions, no post nasal drip Neck: No JVD, no thyromegaly, no carotid bruits Lungs: no use of accessory muscles, no dullness to percussion, decreased without rales or rhonchi  Cardiovascular: Rhythm regular, heart sounds  normal, no murmurs or gallops, no peripheral edema Musculoskeletal: No deformities, no cyanosis or clubbing , no tremors       Assessment & Plan:    Preop clearance -she is cleared for shoulder surgery with due risk. Routine precautions should be taken for OSA and she should use BiPAP during recovery and postop..Marland Kitchen

## 2021-09-22 DIAGNOSIS — L97521 Non-pressure chronic ulcer of other part of left foot limited to breakdown of skin: Secondary | ICD-10-CM | POA: Diagnosis not present

## 2021-09-27 DIAGNOSIS — Z1231 Encounter for screening mammogram for malignant neoplasm of breast: Secondary | ICD-10-CM | POA: Diagnosis not present

## 2021-09-30 DIAGNOSIS — E1165 Type 2 diabetes mellitus with hyperglycemia: Secondary | ICD-10-CM | POA: Diagnosis not present

## 2021-10-04 DIAGNOSIS — M25571 Pain in right ankle and joints of right foot: Secondary | ICD-10-CM | POA: Diagnosis not present

## 2021-10-04 DIAGNOSIS — R768 Other specified abnormal immunological findings in serum: Secondary | ICD-10-CM | POA: Diagnosis not present

## 2021-10-04 DIAGNOSIS — L03116 Cellulitis of left lower limb: Secondary | ICD-10-CM | POA: Diagnosis not present

## 2021-10-04 DIAGNOSIS — M79671 Pain in right foot: Secondary | ICD-10-CM | POA: Diagnosis not present

## 2021-10-04 DIAGNOSIS — M5136 Other intervertebral disc degeneration, lumbar region: Secondary | ICD-10-CM | POA: Diagnosis not present

## 2021-10-04 DIAGNOSIS — L409 Psoriasis, unspecified: Secondary | ICD-10-CM | POA: Diagnosis not present

## 2021-10-04 DIAGNOSIS — M170 Bilateral primary osteoarthritis of knee: Secondary | ICD-10-CM | POA: Diagnosis not present

## 2021-10-04 DIAGNOSIS — M8580 Other specified disorders of bone density and structure, unspecified site: Secondary | ICD-10-CM | POA: Diagnosis not present

## 2021-10-04 DIAGNOSIS — L4050 Arthropathic psoriasis, unspecified: Secondary | ICD-10-CM | POA: Diagnosis not present

## 2021-10-06 DIAGNOSIS — L97521 Non-pressure chronic ulcer of other part of left foot limited to breakdown of skin: Secondary | ICD-10-CM | POA: Diagnosis not present

## 2021-10-06 DIAGNOSIS — J449 Chronic obstructive pulmonary disease, unspecified: Secondary | ICD-10-CM | POA: Diagnosis not present

## 2021-10-06 DIAGNOSIS — G4733 Obstructive sleep apnea (adult) (pediatric): Secondary | ICD-10-CM | POA: Diagnosis not present

## 2021-10-13 DIAGNOSIS — R2681 Unsteadiness on feet: Secondary | ICD-10-CM | POA: Diagnosis not present

## 2021-10-13 DIAGNOSIS — L97521 Non-pressure chronic ulcer of other part of left foot limited to breakdown of skin: Secondary | ICD-10-CM | POA: Diagnosis not present

## 2021-10-18 DIAGNOSIS — M48061 Spinal stenosis, lumbar region without neurogenic claudication: Secondary | ICD-10-CM | POA: Diagnosis not present

## 2021-10-18 DIAGNOSIS — E1142 Type 2 diabetes mellitus with diabetic polyneuropathy: Secondary | ICD-10-CM | POA: Diagnosis not present

## 2021-10-18 DIAGNOSIS — M47816 Spondylosis without myelopathy or radiculopathy, lumbar region: Secondary | ICD-10-CM | POA: Diagnosis not present

## 2021-10-18 DIAGNOSIS — G894 Chronic pain syndrome: Secondary | ICD-10-CM | POA: Diagnosis not present

## 2021-10-19 ENCOUNTER — Other Ambulatory Visit: Payer: Self-pay | Admitting: Gastroenterology

## 2021-10-19 DIAGNOSIS — I5032 Chronic diastolic (congestive) heart failure: Secondary | ICD-10-CM | POA: Diagnosis not present

## 2021-10-19 DIAGNOSIS — J449 Chronic obstructive pulmonary disease, unspecified: Secondary | ICD-10-CM | POA: Diagnosis not present

## 2021-10-19 DIAGNOSIS — I4891 Unspecified atrial fibrillation: Secondary | ICD-10-CM | POA: Diagnosis not present

## 2021-10-19 DIAGNOSIS — M170 Bilateral primary osteoarthritis of knee: Secondary | ICD-10-CM | POA: Diagnosis not present

## 2021-10-19 DIAGNOSIS — Z299 Encounter for prophylactic measures, unspecified: Secondary | ICD-10-CM | POA: Diagnosis not present

## 2021-10-19 DIAGNOSIS — I10 Essential (primary) hypertension: Secondary | ICD-10-CM | POA: Diagnosis not present

## 2021-10-20 DIAGNOSIS — L97521 Non-pressure chronic ulcer of other part of left foot limited to breakdown of skin: Secondary | ICD-10-CM | POA: Diagnosis not present

## 2021-10-28 DIAGNOSIS — R2681 Unsteadiness on feet: Secondary | ICD-10-CM | POA: Diagnosis not present

## 2021-10-31 DIAGNOSIS — E1165 Type 2 diabetes mellitus with hyperglycemia: Secondary | ICD-10-CM | POA: Diagnosis not present

## 2021-11-01 DIAGNOSIS — L97521 Non-pressure chronic ulcer of other part of left foot limited to breakdown of skin: Secondary | ICD-10-CM | POA: Diagnosis not present

## 2021-11-08 DIAGNOSIS — M170 Bilateral primary osteoarthritis of knee: Secondary | ICD-10-CM | POA: Diagnosis not present

## 2021-11-15 DIAGNOSIS — I10 Essential (primary) hypertension: Secondary | ICD-10-CM | POA: Diagnosis not present

## 2021-11-15 DIAGNOSIS — E1165 Type 2 diabetes mellitus with hyperglycemia: Secondary | ICD-10-CM | POA: Diagnosis not present

## 2021-11-15 DIAGNOSIS — L03116 Cellulitis of left lower limb: Secondary | ICD-10-CM | POA: Diagnosis not present

## 2021-11-15 DIAGNOSIS — Z299 Encounter for prophylactic measures, unspecified: Secondary | ICD-10-CM | POA: Diagnosis not present

## 2021-11-15 DIAGNOSIS — L97521 Non-pressure chronic ulcer of other part of left foot limited to breakdown of skin: Secondary | ICD-10-CM | POA: Diagnosis not present

## 2021-11-16 DIAGNOSIS — G894 Chronic pain syndrome: Secondary | ICD-10-CM | POA: Diagnosis not present

## 2021-11-16 DIAGNOSIS — M47816 Spondylosis without myelopathy or radiculopathy, lumbar region: Secondary | ICD-10-CM | POA: Diagnosis not present

## 2021-11-16 DIAGNOSIS — E1142 Type 2 diabetes mellitus with diabetic polyneuropathy: Secondary | ICD-10-CM | POA: Diagnosis not present

## 2021-11-16 DIAGNOSIS — M48061 Spinal stenosis, lumbar region without neurogenic claudication: Secondary | ICD-10-CM | POA: Diagnosis not present

## 2021-11-18 DIAGNOSIS — M25512 Pain in left shoulder: Secondary | ICD-10-CM | POA: Diagnosis not present

## 2021-11-18 DIAGNOSIS — M170 Bilateral primary osteoarthritis of knee: Secondary | ICD-10-CM | POA: Diagnosis not present

## 2021-11-28 DIAGNOSIS — R2681 Unsteadiness on feet: Secondary | ICD-10-CM | POA: Diagnosis not present

## 2021-11-29 DIAGNOSIS — L97521 Non-pressure chronic ulcer of other part of left foot limited to breakdown of skin: Secondary | ICD-10-CM | POA: Diagnosis not present

## 2021-11-30 DIAGNOSIS — E1165 Type 2 diabetes mellitus with hyperglycemia: Secondary | ICD-10-CM | POA: Diagnosis not present

## 2021-12-05 DIAGNOSIS — N8111 Cystocele, midline: Secondary | ICD-10-CM | POA: Diagnosis not present

## 2021-12-08 DIAGNOSIS — M14671 Charcot's joint, right ankle and foot: Secondary | ICD-10-CM | POA: Diagnosis not present

## 2021-12-12 DIAGNOSIS — I10 Essential (primary) hypertension: Secondary | ICD-10-CM | POA: Diagnosis not present

## 2021-12-12 DIAGNOSIS — L03116 Cellulitis of left lower limb: Secondary | ICD-10-CM | POA: Diagnosis not present

## 2021-12-12 DIAGNOSIS — Z299 Encounter for prophylactic measures, unspecified: Secondary | ICD-10-CM | POA: Diagnosis not present

## 2021-12-13 DIAGNOSIS — Z713 Dietary counseling and surveillance: Secondary | ICD-10-CM | POA: Diagnosis not present

## 2021-12-13 DIAGNOSIS — Z299 Encounter for prophylactic measures, unspecified: Secondary | ICD-10-CM | POA: Diagnosis not present

## 2021-12-13 DIAGNOSIS — M79672 Pain in left foot: Secondary | ICD-10-CM | POA: Diagnosis not present

## 2021-12-13 DIAGNOSIS — L97521 Non-pressure chronic ulcer of other part of left foot limited to breakdown of skin: Secondary | ICD-10-CM | POA: Diagnosis not present

## 2021-12-13 DIAGNOSIS — L03116 Cellulitis of left lower limb: Secondary | ICD-10-CM | POA: Diagnosis not present

## 2021-12-13 DIAGNOSIS — I10 Essential (primary) hypertension: Secondary | ICD-10-CM | POA: Diagnosis not present

## 2021-12-14 DIAGNOSIS — M48061 Spinal stenosis, lumbar region without neurogenic claudication: Secondary | ICD-10-CM | POA: Diagnosis not present

## 2021-12-14 DIAGNOSIS — E1142 Type 2 diabetes mellitus with diabetic polyneuropathy: Secondary | ICD-10-CM | POA: Diagnosis not present

## 2021-12-14 DIAGNOSIS — G894 Chronic pain syndrome: Secondary | ICD-10-CM | POA: Diagnosis not present

## 2021-12-14 DIAGNOSIS — M47816 Spondylosis without myelopathy or radiculopathy, lumbar region: Secondary | ICD-10-CM | POA: Diagnosis not present

## 2021-12-20 ENCOUNTER — Other Ambulatory Visit: Payer: Self-pay | Admitting: Gastroenterology

## 2021-12-20 DIAGNOSIS — L97521 Non-pressure chronic ulcer of other part of left foot limited to breakdown of skin: Secondary | ICD-10-CM | POA: Diagnosis not present

## 2021-12-27 DIAGNOSIS — R339 Retention of urine, unspecified: Secondary | ICD-10-CM | POA: Diagnosis not present

## 2021-12-28 DIAGNOSIS — M1710 Unilateral primary osteoarthritis, unspecified knee: Secondary | ICD-10-CM | POA: Diagnosis not present

## 2021-12-28 DIAGNOSIS — M6281 Muscle weakness (generalized): Secondary | ICD-10-CM | POA: Diagnosis not present

## 2021-12-28 DIAGNOSIS — J449 Chronic obstructive pulmonary disease, unspecified: Secondary | ICD-10-CM | POA: Diagnosis not present

## 2021-12-30 DIAGNOSIS — E1165 Type 2 diabetes mellitus with hyperglycemia: Secondary | ICD-10-CM | POA: Diagnosis not present

## 2021-12-30 DIAGNOSIS — R2681 Unsteadiness on feet: Secondary | ICD-10-CM | POA: Diagnosis not present

## 2022-01-02 ENCOUNTER — Other Ambulatory Visit: Payer: Self-pay | Admitting: Gastroenterology

## 2022-01-02 DIAGNOSIS — K582 Mixed irritable bowel syndrome: Secondary | ICD-10-CM

## 2022-01-02 DIAGNOSIS — K219 Gastro-esophageal reflux disease without esophagitis: Secondary | ICD-10-CM

## 2022-01-02 DIAGNOSIS — R110 Nausea: Secondary | ICD-10-CM

## 2022-01-03 DIAGNOSIS — L97521 Non-pressure chronic ulcer of other part of left foot limited to breakdown of skin: Secondary | ICD-10-CM | POA: Diagnosis not present

## 2022-01-04 ENCOUNTER — Encounter: Payer: Self-pay | Admitting: Nurse Practitioner

## 2022-01-04 ENCOUNTER — Ambulatory Visit: Payer: Medicare Other | Attending: Nurse Practitioner | Admitting: Nurse Practitioner

## 2022-01-04 ENCOUNTER — Encounter: Payer: Self-pay | Admitting: *Deleted

## 2022-01-04 VITALS — BP 108/60 | HR 76 | Ht 68.0 in | Wt 311.0 lb

## 2022-01-04 DIAGNOSIS — R011 Cardiac murmur, unspecified: Secondary | ICD-10-CM

## 2022-01-04 DIAGNOSIS — G4733 Obstructive sleep apnea (adult) (pediatric): Secondary | ICD-10-CM | POA: Diagnosis not present

## 2022-01-04 DIAGNOSIS — I480 Paroxysmal atrial fibrillation: Secondary | ICD-10-CM | POA: Diagnosis not present

## 2022-01-04 DIAGNOSIS — Z01810 Encounter for preprocedural cardiovascular examination: Secondary | ICD-10-CM | POA: Diagnosis not present

## 2022-01-04 DIAGNOSIS — J449 Chronic obstructive pulmonary disease, unspecified: Secondary | ICD-10-CM

## 2022-01-04 DIAGNOSIS — I10 Essential (primary) hypertension: Secondary | ICD-10-CM

## 2022-01-04 NOTE — Progress Notes (Addendum)
Cardiology Office Note:    Date:  01/04/2022  ID:  [name] [name], DOB 07-22-1960, MRN [id number]  PCP:  [name] Chroman, MD   [place] Prov[facility] Cardiologist:  [name] Lesches, MD     Referring MD: [name] Chroman, MD   CC: Here for 6 month follow-up  History of Present Illness:    [name] [name] is a 62 y.o. female with a hx of the following:  PAF Hypertension COPD OSA Dyspnea on exertion Chronic lymphedema Vertigo tia  Patient is a delightful 62 year old female with past medical history as mentioned above.  Last seen by Dr. Domenic [name] on July 21, 2021.  She noted intermittent palpitations, no prolonged events.  Was compliant with her medication.  Denied any bleeding problems while on anticoagulation.  Remain compliant on BiPAP for OSA.  No medication changes were made, and was told to follow-up in 6 months.  Today she presents for 68-monthfollow-up.  She states she is doing well. Unfortunately, she broke a bone in her right foot, so she states she will be in a boot for a while. Does have occasional episodes where she can tell she is in A-fib, but doesn't last long.  Denies any chest pain, shortness of breath, sustained palpitations, syncope, presyncope, dizziness, orthopnea, PND, worsening swelling or significant weight changes, acute bleeding, or claudication.  She has lost 20 pounds since July 2023.  I congratulated her. Denies any other questions or concerns.   Past Medical History:  Diagnosis Date   Bladder dysfunction    Self urinary catheterization   Complication of anesthesia    COPD (chronic obstructive pulmonary disease) (HCC)    DDD (degenerative disc disease)    Depression    Esophageal spasm    NTG and Norvasc   Essential hypertension    Fibromyalgia    Gastroparesis    GERD (gastroesophageal reflux disease)    History of cardiac catheterization    Normal coronary arteries   History of pituitary tumor    Irritable bowel syndrome    Lymphedema     Osteoarthritis    PAF (paroxysmal atrial fibrillation) (HCC)    PONV (postoperative nausea and vomiting)    Sleep apnea    BIPAP   Type 2 diabetes mellitus ([place]    Urinary tract infection     Past Surgical History:  Procedure Laterality Date   ABDOMINAL HYSTERECTOMY     ANTERIOR CERVICAL DECOMP/DISCECTOMY FUSION N/A 12/02/2014   Procedure: Cervical five cervical six anterior cervical decompression with fusion interbody prosthesis plating and bone graft;  Surgeon: [name] Pies MD;  Location: [facility];  Service: Neurosurgery;  Laterality: N/A;  C56 anterior cervical decompression with fusion interbody prosthesis plating and bonegraft   APPENDECTOMY     BACK SURGERY     CARDIAC CATHETERIZATION     CARDIOVERSION N/A 03/26/2014   Procedure: CARDIOVERSION;  Surgeon: [name] Commons MD;  Location: [facility];  Service: Endoscopy;  Laterality: N/A;   CARDIOVERSION N/A 05/04/2014   Procedure: CARDIOVERSION;  Surgeon: [name] Sark MD;  Location: [facility] OR[facility];  Service: Cardiovascular;  Laterality: N/A;   CHOLECYSTECTOMY     COLONOSCOPY     Approximately 2003. Per medical records, internal hemorrhoids noted   COLONOSCOPY N/A 06/25/2013   Dr. RGala [name] Anal canal hemorrhoids-more likely the source of paper hematochezia. Redundant, capacious colon. Multiple colonic polyps-tubular adenoma   COLONOSCOPY WITH PROPOFOL N/A 12/04/2016   Dr. RGala [name] redundant colon, colonic diverticulosis. Screening due in 2023  COLONOSCOPY WITH PROPOFOL N/A 08/11/2021   Procedure: COLONOSCOPY WITH PROPOFOL;  Surgeon: [name] Dolin, MD;  Location: [facility];  Service: Endoscopy;  Laterality: N/A;  9:15am   ENDOVENOUS ABLATION SAPHENOUS VEIN W/ LASER Left 03/07/2018   endovenous ablation left greater saphenous vein by Deitra Mayo MD    ESOPHAGOGASTRODUODENOSCOPY  10/12/2004   [id number] plaquing on the esophageal mucosa of uncertain significance, not typical of what is seen with candida esophagitis  status post KOH brushing for KOH prep and biopsy for histology. Rule out candida esophagitis/eosinophilic esophagitis. Otherwise normal esophagus. Tiny hiatal hernia. Otherwise, normal stomach, normal D1 and D2. Benign biopsy of esophagus, unknown KOH status.    ESOPHAGOGASTRODUODENOSCOPY N/A 06/25/2013   Dr. Rourk:mild chronic gastritis   KNEE SURGERY     LEFT HEART CATHETERIZATION WITH CORONARY ANGIOGRAM N/A 11/15/2010   Procedure: LEFT HEART CATHETERIZATION WITH CORONARY ANGIOGRAM;  Surgeon: [name] Page, MD;  Location: [facility] CATH LAB;  Service: Cardiovascular;  Laterality: N/A;   LUNG BIOPSY     POLYPECTOMY  08/11/2021   Procedure: POLYPECTOMY INTESTINAL;  Surgeon: [name] Dolin, MD;  Location: [facility];  Service: Endoscopy;;   RIGHT/LEFT HEART CATH AND CORONARY ANGIOGRAPHY N/A 08/28/2018   Procedure: RIGHT/LEFT HEART CATH AND CORONARY ANGIOGRAPHY;  Surgeon: [name] Blanks, MD;  Location: [place] CV LAB;  Service: Cardiovascular;  Laterality: N/A;    Current Medications: Current Meds  Medication Sig   albuterol (PROVENTIL) (2.5 MG/3ML) 0.083% nebulizer solution Take 2.5 mg by nebulization every 6 (six) hours as needed for wheezing. For shortness of breath.   albuterol (VENTOLIN HFA) 108 (90 Base) MCG/ACT inhaler Inhale 2 puffs into the lungs every 6 (six) hours as needed for wheezing or shortness of breath.   atorvastatin (LIPITOR) 40 MG tablet Take 40 mg by mouth daily.   baclofen (LIORESAL) 20 MG tablet Take 20 mg by mouth 3 (three) times daily.   CARTIA XT 120 MG 24 hr capsule TAKE ONE CAPSULE BY MOUTH EVERY DAY   DEXILANT 60 MG capsule TAKE ONE CAPSULE BY MOUTH EVERY DAY   diclofenac sodium (VOLTAREN) 1 % GEL Apply 2-4 g topically 4 (four) times daily as needed (for pain).    docusate sodium (COLACE) 100 MG capsule Take 100 mg by mouth 2 (two) times daily.   famotidine (PEPCID) 20 MG tablet TAKE 1 TABLET BY MOUTH EVERY DAY AS NEEDED FOR HEARTBURN OR INDIGESTION    flecainide (TAMBOCOR) 50 MG tablet TAKE 2 TABLETS BY MOUTH EVERY MORNING and TAKE 2 TABLETS BY MOUTH EVERY EVENING   furosemide (LASIX) 80 MG tablet Take 80 mg by mouth 2 (two) times daily.   gabapentin (NEURONTIN) 800 MG tablet Take 800 mg by mouth 4 (four) times daily.    glimepiride (AMARYL) 4 MG tablet Take 4 mg by mouth 2 (two) times daily.   hydrocortisone (ANUSOL-HC) 2.5 % rectal cream Place 1 application rectally 2 (two) times daily. (Patient taking differently: Place 1 application  rectally 2 (two) times daily as needed for hemorrhoids.)   isosorbide mononitrate (IMDUR) 120 MG 24 hr tablet Take 120 mg by mouth daily. In the morning   isosorbide mononitrate (IMDUR) 30 MG 24 hr tablet Take 30 mg by mouth every evening.   LINZESS 290 MCG CAPS capsule TAKE ONE CAPSULE BY MOUTH EVERY DAY BEFORE BREAKFAST - REPLACES MOVANTIK   lisinopril (PRINIVIL,ZESTRIL) 10 MG tablet Take 10 mg by mouth every evening.    loratadine (CLARITIN) 10 MG tablet Take 1 tablet (  10 mg total) by mouth daily.   meclizine (ANTIVERT) 25 MG tablet Take 25 mg by mouth 3 (three) times daily.   metFORMIN (GLUCOPHAGE) 500 MG tablet Take 1,000 mg by mouth 2 (two) times daily. Take 1,'000mg'$  in the Morning and 500 mg in the evening   metolazone (ZAROXOLYN) 2.5 MG tablet Take 5 mg by mouth daily with breakfast.   MOTEGRITY 2 MG TABS TAKE 1 TABLET BY MOUTH DAILY   Multiple Vitamin (MULTIVITAMIN WITH MINERALS) TABS tablet Take 1 tablet by mouth daily.   mupirocin ointment (BACTROBAN) 2 % Apply 1 Application topically in the morning and at bedtime.   nitroGLYCERIN (NITROLINGUAL) 0.4 MG/SPRAY spray USE 1 SPRAY UNDER THE TONGUE EVERY 5 MINUTES UP TO 3 DOSES AS NEEDED FOR CHEST PAIN. GO TO ER IF NOT RESOLVED   NUCYNTA ER 100 MG 12 hr tablet Take 100 mg by mouth 2 (two) times daily.   nystatin (MYCOSTATIN/NYSTOP) 100000 UNIT/GM POWD Apply 1 g topically daily.   ondansetron (ZOFRAN) 4 MG tablet TAKE 1 TABLET BY MOUTH EVERY 8 HOURS AS  NEEDED FOR NAUSEA AND VOMITING   oxyCODONE-acetaminophen (PERCOCET) 10-325 MG per tablet Take 1 tablet by mouth every 4 (four) hours as needed for pain.   OXYGEN Inhale 2 L into the lungs as needed (shortness of breath).   OZEMPIC, 1 MG/DOSE, 4 MG/3ML SOPN Inject 1 mg into the skin every Tuesday.   polyethylene glycol-electrolytes (NULYTELY) 420 g solution As directed   potassium chloride SA (K-DUR,KLOR-CON) 20 MEQ tablet Take 40 mEq by mouth 5 (five) times daily.   rivaroxaban (XARELTO) 20 MG TABS tablet Take 20 mg by mouth daily with supper.    sodium chloride (OCEAN) 0.65 % SOLN nasal spray Place 1 spray into both nostrils as needed for congestion.   spironolactone (ALDACTONE) 50 MG tablet Take 50 mg by mouth 3 (three) times daily.   TOUJEO SOLOSTAR 300 UNIT/ML Solostar Pen Inject 46 Units into the skin at bedtime.   VASCEPA 1 g CAPS Take 1 g by mouth 4 (four) times daily.   [DISCONTINUED] ANORO ELLIPTA 62.5-25 MCG/ACT AEPB INHALE 1 PUFF BY MOUTH EVERY DAY   [DISCONTINUED] fluticasone (FLONASE) 50 MCG/ACT nasal spray INSTILL 2 SPRAYS IN EACH NOSTRIL EVERY DAY     Allergies:   Tape, Hyoscyamine sulfate, and Metoclopramide   Social History   Socioeconomic History   Marital status: Divorced    Spouse name: Not on file   Number of children: Not on file   Years of education: Not on file   Highest education level: Not on file  Occupational History   Not on file  Tobacco Use   Smoking status: Former    Packs/day: 1.50    Years: 30.00    Total pack years: 45.00    Types: Cigarettes    Start date: 03/06/1977    Quit date: 01/03/2007    Years since quitting: 15.1   Smokeless tobacco: Never  Vaping Use   Vaping Use: Never used  Substance and Sexual Activity   Alcohol use: No    Alcohol/week: 0.0 standard drinks of alcohol   Drug use: No   Sexual activity: Not Currently  Other Topics Concern   Not on file  Social History Narrative   Not on file   Social Determinants of Health    Financial Resource Strain: Not on file  Food Insecurity: Not on file  Transportation Needs: Not on file  Physical Activity: Not on file  Stress: Not on file  Social Connections: Not on file     Family History: The patient's family history includes Arrhythmia in her brother, father, and mother; Breast cancer in her maternal aunt and sister; Cancer in her mother and sister; Colon cancer in her maternal grandfather, paternal aunt, and paternal aunt; Coronary artery disease in her father; Dementia in her mother; Depression in her sister, sister, and sister; Diabetes in her father and son; Heart attack in her father; Parkinson's disease in her father and mother; Stroke in her mother.  ROS:   Review of Systems  Constitutional: Negative.   HENT: Negative.    Eyes: Negative.   Respiratory: Negative.    Cardiovascular:  Positive for palpitations. Negative for chest pain, orthopnea, claudication, leg swelling and PND.  Gastrointestinal: Negative.   Genitourinary: Negative.   Musculoskeletal:  Positive for joint pain. Negative for back pain, falls, myalgias and neck pain.  Skin: Negative.   Neurological: Negative.   Endo/Heme/Allergies: Negative.   Psychiatric/Behavioral: Negative.      Please see the history of present illness.    All other systems reviewed and are negative.  EKGs/Labs/Other Studies Reviewed:    The following studies were reviewed today:   EKG:  EKG is not ordered today.      Right and left heart cath on August 28, 2018: The left ventricular systolic function is normal. The left ventricular ejection fraction is 55-65% by visual estimate.   1. No angiographic evidence of CAD 2. Mildly elevated left ventricular filling pressures 3. Dyspnea likely multifactorial due to COPD, obesity and physical deconditioning with chronic diastolic CHF   No further ischemic workup.   Recent Labs: 08/11/2021: BUN 8; Creatinine, Ser 0.60; Hemoglobin 11.6; Potassium 4.0; Sodium  137  Recent Lipid Panel No results found for: "CHOL", "TRIG", "HDL", "CHOLHDL", "VLDL", "LDLCALC", "LDLDIRECT"   Risk Assessment/Calculations:    CHA2DS2-VASc Score = 3   This indicates a 3.2% annual risk of stroke. The patient's score is based upon: CHF History: 0 HTN History: 1 Diabetes History: 1 Stroke History: 0 Vascular Disease History: 0 Age Score: 0 Gender Score: 1         Physical Exam:    VS:  BP 108/60   Pulse 76   Ht '5\' 8"'$  (1.727 m)   Wt (!) 311 lb (141.1 kg) Comment: weight reported  SpO2 95%   BMI 47.29 kg/m     Wt Readings from Last 3 Encounters:  01/04/22 (!) 311 lb (141.1 kg)  09/20/21 (!) 325 lb 12.8 oz (147.8 kg)  08/11/21 (!) (P) 330 lb (149.7 kg)     GEN: Morbidly obese, 62 y.o. female in no acute distress HEENT: Normal NECK: No JVD; No carotid bruits CARDIAC: S1/S2, irregular rate and rhythm, Grade 2/6 systolic murmur noted, no rubs or gallops noted; 2+ radial pulses, 1+ PT along left foot, unable to assess right lower foot due to boot. RESPIRATORY:  Clear to auscultation without rales, wheezing or rhonchi  MUSCULOSKELETAL:  Generalized, nonpitting edema, chronic lymphedema; R foot in boot SKIN: Warm and dry NEUROLOGIC:  Alert and oriented x 3 PSYCHIATRIC:  Normal, pleasant affect   ASSESSMENT:    1. PAF (paroxysmal atrial fibrillation) ([place])   2. Murmur, cardiac   3. Hypertension, unspecified type   4. Chronic obstructive pulmonary disease, unspecified COPD type ([place])   5. OSA treated with BiPAP   6. Morbid obesity ([place])   7. Pre-operative cardiovascular examination    PLAN:    In order of problems listed above:  PAF, cardiac murmur Stable, chronic occasional episodes of palpitations.  Not sustained.  Continue diltiazem and flecainide. CrCl 109.  Denies any bleeding issues on Xarelto.  Continue Xarelto 20 mg daily. Heart healthy diet and regular cardiovascular exercise as tolerated encouraged.  Grade 2/6 systolic murmur noted on  exam.  EF from 2020 was approximately 55%.  Findings were not well-visualized.  Will update 2D echocardiogram at this time.   HTN BP stable today.  BP well-controlled at home.  Continue current medication regimen. Discussed to monitor BP at home at least 2 hours after medications and sitting for 5-10 minutes. Heart healthy diet and regular cardiovascular exercise as tolerated encouraged.   COPD, OSA treated with BiPAP Denies any acute exacerbations or worsening symptoms.  Encouraged continued compliance with BiPAP.  Continue current medication regimen and continue to follow-up with PCP.  Morbid obesity BMI today 47.29.  She has lost 20 pounds since last July.  I congratulated her. Weight loss via diet and exercise as tolerated encouraged. Discussed the impact being overweight would have on cardiovascular risk.  Pre-operative cardiovascular risk assessment Addendum 02/24/22: Patient is pending charcot foot reconstruction surgery. Clearance Date is TBD. Surgeon will be Dr. Erline Hau of [facility].  Ms. Degracia's perioperative risk of a major cardiac event is 11% according to the Revised Cardiac Risk Index (RCRI).  Therefore, she is at high risk for perioperative complications.   Her functional capacity is poor at 3.63 METs according to the Duke Activity Status Index (DASI). Recommendations: The patient is at high risk for perioperative cardiac complications and is at a low functional capacity.  However, further testing will not change how her cardiac status is managed.  Proceed with surgery at high risk if there are no other options for treatment. Antiplatelet and/or Anticoagulation Recommendations: Per office protocol, patient can hold Xarelto for 2-3 days prior to procedure. Case d/w Dr. Domenic [name], patient's cardiologist, who agreed that patient can proceed to surgery. High RCRI risk discussed with patient, and she verbalizes understanding. Will route this note to requesting  party.      Medication Adjustments/Labs and Tests Ordered: Current medicines are reviewed at length with the patient today.  Concerns regarding medicines are outlined above.  Orders Placed This Encounter  Procedures   ECHOCARDIOGRAM COMPLETE   No orders of the defined types were placed in this encounter.   Patient Instructions  Medication Instructions:  Your physician recommends that you continue on your current medications as directed. Please refer to the Current Medication list given to you today.  *If you need a refill on your cardiac medications before your next appointment, please call your pharmacy*   Lab Work: NONE   If you have labs (blood work) drawn today and your tests are completely normal, you will receive your results only by: [place] (if you have MyChart) OR A paper copy in the mail If you have any lab test that is abnormal or we need to change your treatment, we will call you to review the results.   Testing/Procedures: Your physician has requested that you have an echocardiogram. Echocardiography is a painless test that uses sound waves to create images of your heart. It provides your doctor with information about the size and shape of your heart and how well your heart's chambers and valves are working. This procedure takes approximately one hour. There are no restrictions for this procedure. Please do NOT wear cologne, perfume, aftershave, or lotions (deodorant is allowed). Please arrive 30  minutes prior to your appointment time.    Follow-Up: At [facility], you and your health needs are our priority.  As part of our continuing mission to provide you with exceptional heart care, we have created designated Provider Care Teams.  These Care Teams include your primary Cardiologist (physician) and Advanced Practice Providers (APPs -  Physician Assistants and Nurse Practitioners) who all work together to provide you with the care you need, when  you need it.  We recommend signing up for the patient portal called "MyChart".  Sign up information is provided on this After Visit Summary.  MyChart is used to connect with patients for Virtual Visits (Telemedicine).  Patients are able to view lab/test results, encounter notes, upcoming appointments, etc.  Non-urgent messages can be sent to your provider as well.   To learn more about what you can do with MyChart, go to [email or web address].    Your next appointment:   6 month(s)  The format for your next appointment:   In Person  Provider:   Rozann Lesches, MD    Other Instructions Thank you for choosing [facility]!    Important Information About Sugar         Signed, Finis Bud, NP  02/24/2022 3:14 PM    [facility]

## 2022-01-04 NOTE — Patient Instructions (Signed)
Medication Instructions:  Your physician recommends that you continue on your current medications as directed. Please refer to the Current Medication list given to you today.  *If you need a refill on your cardiac medications before your next appointment, please call your pharmacy*   Lab Work: NONE   If you have labs (blood work) drawn today and your tests are completely normal, you will receive your results only by: [place] (if you have MyChart) OR A paper copy in the mail If you have any lab test that is abnormal or we need to change your treatment, we will call you to review the results.   Testing/Procedures: Your physician has requested that you have an echocardiogram. Echocardiography is a painless test that uses sound waves to create images of your heart. It provides your doctor with information about the size and shape of your heart and how well your heart's chambers and valves are working. This procedure takes approximately one hour. There are no restrictions for this procedure. Please do NOT wear cologne, perfume, aftershave, or lotions (deodorant is allowed). Please arrive 15 minutes prior to your appointment time.    Follow-Up: At [facility], you and your health needs are our priority.  As part of our continuing mission to provide you with exceptional heart care, we have created designated Provider Care Teams.  These Care Teams include your primary Cardiologist (physician) and Advanced Practice Providers (APPs -  Physician Assistants and Nurse Practitioners) who all work together to provide you with the care you need, when you need it.  We recommend signing up for the patient portal called "MyChart".  Sign up information is provided on this After Visit Summary.  MyChart is used to connect with patients for Virtual Visits (Telemedicine).  Patients are able to view lab/test results, encounter notes, upcoming appointments, etc.  Non-urgent messages can be sent to  your provider as well.   To learn more about what you can do with MyChart, go to [email or web address].    Your next appointment:   6 month(s)  The format for your next appointment:   In Person  Provider:   Rozann Lesches, MD    Other Instructions Thank you for choosing [facility]!    Important Information About Sugar

## 2022-01-06 DIAGNOSIS — J449 Chronic obstructive pulmonary disease, unspecified: Secondary | ICD-10-CM | POA: Diagnosis not present

## 2022-01-06 DIAGNOSIS — G4733 Obstructive sleep apnea (adult) (pediatric): Secondary | ICD-10-CM | POA: Diagnosis not present

## 2022-01-10 DIAGNOSIS — G894 Chronic pain syndrome: Secondary | ICD-10-CM | POA: Diagnosis not present

## 2022-01-10 DIAGNOSIS — M48061 Spinal stenosis, lumbar region without neurogenic claudication: Secondary | ICD-10-CM | POA: Diagnosis not present

## 2022-01-10 DIAGNOSIS — E1142 Type 2 diabetes mellitus with diabetic polyneuropathy: Secondary | ICD-10-CM | POA: Diagnosis not present

## 2022-01-10 DIAGNOSIS — M47816 Spondylosis without myelopathy or radiculopathy, lumbar region: Secondary | ICD-10-CM | POA: Diagnosis not present

## 2022-01-11 ENCOUNTER — Telehealth: Payer: Self-pay

## 2022-01-11 NOTE — Telephone Encounter (Signed)
Documentation on your desk in the yellow folder from the pt's insurance advising Dexilant not on the pt's formulary and that a alternative needs to be provided. Please advise

## 2022-01-12 ENCOUNTER — Encounter: Payer: Self-pay | Admitting: Internal Medicine

## 2022-01-12 ENCOUNTER — Ambulatory Visit: Payer: Medicare Other | Attending: Nurse Practitioner

## 2022-01-12 DIAGNOSIS — R011 Cardiac murmur, unspecified: Secondary | ICD-10-CM

## 2022-01-13 LAB — ECHOCARDIOGRAM COMPLETE
AR max vel: 2.64 cm2
AV Area VTI: 2.73 cm2
AV Area mean vel: 2.91 cm2
AV Mean grad: 13 mmHg
AV Peak grad: 21.9 mmHg
Ao pk vel: 2.34 m/s
Area-P 1/2: 2.35 cm2
Calc EF: 75.3 %
S' Lateral: 2.6 cm
Single Plane A2C EF: 73.4 %
Single Plane A4C EF: 74.3 %

## 2022-01-17 DIAGNOSIS — L97521 Non-pressure chronic ulcer of other part of left foot limited to breakdown of skin: Secondary | ICD-10-CM | POA: Diagnosis not present

## 2022-01-22 ENCOUNTER — Other Ambulatory Visit: Payer: Self-pay | Admitting: Pulmonary Disease

## 2022-01-26 DIAGNOSIS — R339 Retention of urine, unspecified: Secondary | ICD-10-CM | POA: Diagnosis not present

## 2022-01-26 MED ORDER — PANTOPRAZOLE SODIUM 40 MG PO TBEC: 40.0000 mg | DELAYED_RELEASE_TABLET | Freq: Every day | ORAL | 3 refills | Status: DC

## 2022-01-26 NOTE — Telephone Encounter (Signed)
Phoned and advised the pt Rx being phoned to pharmacy and why Nexium was not one of the preferred. Pt expressed understanding

## 2022-01-26 NOTE — Addendum Note (Signed)
Addended by: Annitta Needs on: 01/26/2022 01:58 PM   Modules accepted: Orders

## 2022-01-26 NOTE — Telephone Encounter (Signed)
Appears preferred alternatives include pantoprazole (Tier 1), lansoprazole (Tier 2), omeprazole (tier 2), esomeprazole (Tier 3), and rabeprazole (tier 3).   Review of chart shows she has taken Nexium int he past. I will send in pantoprazole to start.   Please let her know. Thanks!

## 2022-01-30 DIAGNOSIS — E1165 Type 2 diabetes mellitus with hyperglycemia: Secondary | ICD-10-CM | POA: Diagnosis not present

## 2022-01-31 DIAGNOSIS — M14679 Charcot's joint, unspecified ankle and foot: Secondary | ICD-10-CM | POA: Diagnosis not present

## 2022-02-02 DIAGNOSIS — L4050 Arthropathic psoriasis, unspecified: Secondary | ICD-10-CM | POA: Diagnosis not present

## 2022-02-02 DIAGNOSIS — M5136 Other intervertebral disc degeneration, lumbar region: Secondary | ICD-10-CM | POA: Diagnosis not present

## 2022-02-02 DIAGNOSIS — M6281 Muscle weakness (generalized): Secondary | ICD-10-CM | POA: Diagnosis not present

## 2022-02-02 DIAGNOSIS — R768 Other specified abnormal immunological findings in serum: Secondary | ICD-10-CM | POA: Diagnosis not present

## 2022-02-02 DIAGNOSIS — L03116 Cellulitis of left lower limb: Secondary | ICD-10-CM | POA: Diagnosis not present

## 2022-02-02 DIAGNOSIS — L409 Psoriasis, unspecified: Secondary | ICD-10-CM | POA: Diagnosis not present

## 2022-02-02 DIAGNOSIS — J449 Chronic obstructive pulmonary disease, unspecified: Secondary | ICD-10-CM | POA: Diagnosis not present

## 2022-02-02 DIAGNOSIS — M8580 Other specified disorders of bone density and structure, unspecified site: Secondary | ICD-10-CM | POA: Diagnosis not present

## 2022-02-02 DIAGNOSIS — M170 Bilateral primary osteoarthritis of knee: Secondary | ICD-10-CM | POA: Diagnosis not present

## 2022-02-02 DIAGNOSIS — M1710 Unilateral primary osteoarthritis, unspecified knee: Secondary | ICD-10-CM | POA: Diagnosis not present

## 2022-02-03 DIAGNOSIS — R2681 Unsteadiness on feet: Secondary | ICD-10-CM | POA: Diagnosis not present

## 2022-02-09 DIAGNOSIS — E1142 Type 2 diabetes mellitus with diabetic polyneuropathy: Secondary | ICD-10-CM | POA: Diagnosis not present

## 2022-02-09 DIAGNOSIS — M48061 Spinal stenosis, lumbar region without neurogenic claudication: Secondary | ICD-10-CM | POA: Diagnosis not present

## 2022-02-09 DIAGNOSIS — M47816 Spondylosis without myelopathy or radiculopathy, lumbar region: Secondary | ICD-10-CM | POA: Diagnosis not present

## 2022-02-09 DIAGNOSIS — G894 Chronic pain syndrome: Secondary | ICD-10-CM | POA: Diagnosis not present

## 2022-02-14 DIAGNOSIS — L97521 Non-pressure chronic ulcer of other part of left foot limited to breakdown of skin: Secondary | ICD-10-CM | POA: Diagnosis not present

## 2022-02-16 DIAGNOSIS — M79671 Pain in right foot: Secondary | ICD-10-CM | POA: Diagnosis not present

## 2022-02-16 DIAGNOSIS — E11610 Type 2 diabetes mellitus with diabetic neuropathic arthropathy: Secondary | ICD-10-CM | POA: Diagnosis not present

## 2022-02-17 ENCOUNTER — Telehealth: Payer: Self-pay | Admitting: *Deleted

## 2022-02-17 NOTE — Telephone Encounter (Signed)
   Pre-operative Risk Assessment    Patient Name: [name] [name]  DOB: 12/03/1960 MRN: [id number]      Request for Surgical Clearance    Procedure:   Charcot Food Reconstruction  Date of Surgery:  Clearance TBD                                 Surgeon:  Erline Hau, DPM Surgeon's Group or Practice Name:  [facility] Phone number:  [phone] Fax number:  [phone]   Type of Clearance Requested:   - [place] (xarelto)  5. What type of anesthesia will be used?  Press F2 and select the anesthesia to be used for the procedure.  :1}  Type of Anesthesia:  General    Additional requests/questions:    Signed, Marlou Sa   02/17/2022, 12:08 PM

## 2022-02-20 NOTE — Telephone Encounter (Signed)
Routed to pharmacy for clearance to hold Xarelto.  [name] [name], you saw this patient on 01/04/2022.  Echocardiogram was completed per your request on 01/13/2022. Could you please comment on medical clearance for foot surgery?  Please direct your response to p cv div preop.  Thank you, [name] [name]

## 2022-02-20 NOTE — Telephone Encounter (Signed)
Patient with diagnosis of afib on Xarelto for anticoagulation.    Procedure: charcot foot reconstruction Date of procedure: TBD  CHA2DS2-VASc Score = 3  This indicates a 3.2% annual risk of stroke. The patient's score is based upon: CHF History: 0 HTN History: 1 Diabetes History: 1 Stroke History: 0 Vascular Disease History: 0 Age Score: 0 Gender Score: 1   CrCl >182m/min Platelet count 319K  Per office protocol, patient can hold Xarelto for 2-3 days prior to procedure.    **This guidance is not considered finalized until pre-operative APP has relayed final recommendations.**

## 2022-02-24 DIAGNOSIS — R339 Retention of urine, unspecified: Secondary | ICD-10-CM | POA: Diagnosis not present

## 2022-02-25 DIAGNOSIS — R2681 Unsteadiness on feet: Secondary | ICD-10-CM | POA: Diagnosis not present

## 2022-02-28 DIAGNOSIS — I10 Essential (primary) hypertension: Secondary | ICD-10-CM | POA: Diagnosis not present

## 2022-02-28 DIAGNOSIS — I5032 Chronic diastolic (congestive) heart failure: Secondary | ICD-10-CM | POA: Diagnosis not present

## 2022-02-28 DIAGNOSIS — Z299 Encounter for prophylactic measures, unspecified: Secondary | ICD-10-CM | POA: Diagnosis not present

## 2022-02-28 DIAGNOSIS — M1460 Charcot's joint, unspecified site: Secondary | ICD-10-CM | POA: Diagnosis not present

## 2022-02-28 DIAGNOSIS — E1165 Type 2 diabetes mellitus with hyperglycemia: Secondary | ICD-10-CM | POA: Diagnosis not present

## 2022-03-01 DIAGNOSIS — E1165 Type 2 diabetes mellitus with hyperglycemia: Secondary | ICD-10-CM | POA: Diagnosis not present

## 2022-03-07 DIAGNOSIS — G894 Chronic pain syndrome: Secondary | ICD-10-CM | POA: Diagnosis not present

## 2022-03-07 DIAGNOSIS — E1142 Type 2 diabetes mellitus with diabetic polyneuropathy: Secondary | ICD-10-CM | POA: Diagnosis not present

## 2022-03-07 DIAGNOSIS — M48061 Spinal stenosis, lumbar region without neurogenic claudication: Secondary | ICD-10-CM | POA: Diagnosis not present

## 2022-03-07 DIAGNOSIS — M47816 Spondylosis without myelopathy or radiculopathy, lumbar region: Secondary | ICD-10-CM | POA: Diagnosis not present

## 2022-03-14 DIAGNOSIS — M79676 Pain in unspecified toe(s): Secondary | ICD-10-CM | POA: Diagnosis not present

## 2022-03-14 DIAGNOSIS — E1142 Type 2 diabetes mellitus with diabetic polyneuropathy: Secondary | ICD-10-CM | POA: Diagnosis not present

## 2022-03-14 DIAGNOSIS — L84 Corns and callosities: Secondary | ICD-10-CM | POA: Diagnosis not present

## 2022-03-14 DIAGNOSIS — B351 Tinea unguium: Secondary | ICD-10-CM | POA: Diagnosis not present

## 2022-03-27 DIAGNOSIS — R339 Retention of urine, unspecified: Secondary | ICD-10-CM | POA: Diagnosis not present

## 2022-03-29 DIAGNOSIS — M1710 Unilateral primary osteoarthritis, unspecified knee: Secondary | ICD-10-CM | POA: Diagnosis not present

## 2022-03-29 DIAGNOSIS — J449 Chronic obstructive pulmonary disease, unspecified: Secondary | ICD-10-CM | POA: Diagnosis not present

## 2022-03-29 DIAGNOSIS — M6281 Muscle weakness (generalized): Secondary | ICD-10-CM | POA: Diagnosis not present

## 2022-04-01 DIAGNOSIS — E1165 Type 2 diabetes mellitus with hyperglycemia: Secondary | ICD-10-CM | POA: Diagnosis not present

## 2022-04-04 DIAGNOSIS — H35033 Hypertensive retinopathy, bilateral: Secondary | ICD-10-CM | POA: Diagnosis not present

## 2022-04-07 DIAGNOSIS — G4733 Obstructive sleep apnea (adult) (pediatric): Secondary | ICD-10-CM | POA: Diagnosis not present

## 2022-04-07 DIAGNOSIS — J449 Chronic obstructive pulmonary disease, unspecified: Secondary | ICD-10-CM | POA: Diagnosis not present

## 2022-04-10 DIAGNOSIS — R2681 Unsteadiness on feet: Secondary | ICD-10-CM | POA: Diagnosis not present

## 2022-04-11 DIAGNOSIS — M48061 Spinal stenosis, lumbar region without neurogenic claudication: Secondary | ICD-10-CM | POA: Diagnosis not present

## 2022-04-11 DIAGNOSIS — G894 Chronic pain syndrome: Secondary | ICD-10-CM | POA: Diagnosis not present

## 2022-04-11 DIAGNOSIS — M47816 Spondylosis without myelopathy or radiculopathy, lumbar region: Secondary | ICD-10-CM | POA: Diagnosis not present

## 2022-04-11 DIAGNOSIS — E1142 Type 2 diabetes mellitus with diabetic polyneuropathy: Secondary | ICD-10-CM | POA: Diagnosis not present

## 2022-04-12 ENCOUNTER — Other Ambulatory Visit: Payer: Self-pay | Admitting: Cardiology

## 2022-04-24 DIAGNOSIS — R2681 Unsteadiness on feet: Secondary | ICD-10-CM | POA: Diagnosis not present

## 2022-04-25 DIAGNOSIS — I10 Essential (primary) hypertension: Secondary | ICD-10-CM | POA: Diagnosis not present

## 2022-04-25 DIAGNOSIS — Z87891 Personal history of nicotine dependence: Secondary | ICD-10-CM | POA: Diagnosis not present

## 2022-04-25 DIAGNOSIS — I5032 Chronic diastolic (congestive) heart failure: Secondary | ICD-10-CM | POA: Diagnosis not present

## 2022-04-25 DIAGNOSIS — Z299 Encounter for prophylactic measures, unspecified: Secondary | ICD-10-CM | POA: Diagnosis not present

## 2022-04-25 DIAGNOSIS — J449 Chronic obstructive pulmonary disease, unspecified: Secondary | ICD-10-CM | POA: Diagnosis not present

## 2022-04-25 DIAGNOSIS — R52 Pain, unspecified: Secondary | ICD-10-CM | POA: Diagnosis not present

## 2022-04-25 DIAGNOSIS — M069 Rheumatoid arthritis, unspecified: Secondary | ICD-10-CM | POA: Diagnosis not present

## 2022-04-25 DIAGNOSIS — Z0000 Encounter for general adult medical examination without abnormal findings: Secondary | ICD-10-CM | POA: Diagnosis not present

## 2022-04-25 DIAGNOSIS — Z7189 Other specified counseling: Secondary | ICD-10-CM | POA: Diagnosis not present

## 2022-04-26 ENCOUNTER — Other Ambulatory Visit (HOSPITAL_COMMUNITY): Payer: Self-pay | Admitting: Orthopedic Surgery

## 2022-04-26 DIAGNOSIS — R339 Retention of urine, unspecified: Secondary | ICD-10-CM | POA: Diagnosis not present

## 2022-04-26 DIAGNOSIS — M19012 Primary osteoarthritis, left shoulder: Secondary | ICD-10-CM

## 2022-04-29 DIAGNOSIS — J449 Chronic obstructive pulmonary disease, unspecified: Secondary | ICD-10-CM | POA: Diagnosis not present

## 2022-04-29 DIAGNOSIS — M6281 Muscle weakness (generalized): Secondary | ICD-10-CM | POA: Diagnosis not present

## 2022-04-29 DIAGNOSIS — M1710 Unilateral primary osteoarthritis, unspecified knee: Secondary | ICD-10-CM | POA: Diagnosis not present

## 2022-05-02 DIAGNOSIS — M170 Bilateral primary osteoarthritis of knee: Secondary | ICD-10-CM | POA: Diagnosis not present

## 2022-05-02 DIAGNOSIS — E1165 Type 2 diabetes mellitus with hyperglycemia: Secondary | ICD-10-CM | POA: Diagnosis not present

## 2022-05-03 ENCOUNTER — Encounter: Payer: Self-pay | Admitting: Internal Medicine

## 2022-05-09 DIAGNOSIS — M48061 Spinal stenosis, lumbar region without neurogenic claudication: Secondary | ICD-10-CM | POA: Diagnosis not present

## 2022-05-09 DIAGNOSIS — G894 Chronic pain syndrome: Secondary | ICD-10-CM | POA: Diagnosis not present

## 2022-05-09 DIAGNOSIS — E1142 Type 2 diabetes mellitus with diabetic polyneuropathy: Secondary | ICD-10-CM | POA: Diagnosis not present

## 2022-05-09 DIAGNOSIS — M47816 Spondylosis without myelopathy or radiculopathy, lumbar region: Secondary | ICD-10-CM | POA: Diagnosis not present

## 2022-05-17 DIAGNOSIS — R768 Other specified abnormal immunological findings in serum: Secondary | ICD-10-CM | POA: Diagnosis not present

## 2022-05-17 DIAGNOSIS — M170 Bilateral primary osteoarthritis of knee: Secondary | ICD-10-CM | POA: Diagnosis not present

## 2022-05-17 DIAGNOSIS — M5136 Other intervertebral disc degeneration, lumbar region: Secondary | ICD-10-CM | POA: Diagnosis not present

## 2022-05-17 DIAGNOSIS — R110 Nausea: Secondary | ICD-10-CM | POA: Diagnosis not present

## 2022-05-17 DIAGNOSIS — L03116 Cellulitis of left lower limb: Secondary | ICD-10-CM | POA: Diagnosis not present

## 2022-05-17 DIAGNOSIS — M8580 Other specified disorders of bone density and structure, unspecified site: Secondary | ICD-10-CM | POA: Diagnosis not present

## 2022-05-17 DIAGNOSIS — L409 Psoriasis, unspecified: Secondary | ICD-10-CM | POA: Diagnosis not present

## 2022-05-17 DIAGNOSIS — L4050 Arthropathic psoriasis, unspecified: Secondary | ICD-10-CM | POA: Diagnosis not present

## 2022-05-22 DIAGNOSIS — M14679 Charcot's joint, unspecified ankle and foot: Secondary | ICD-10-CM | POA: Diagnosis not present

## 2022-05-22 DIAGNOSIS — E11610 Type 2 diabetes mellitus with diabetic neuropathic arthropathy: Secondary | ICD-10-CM | POA: Diagnosis not present

## 2022-05-23 DIAGNOSIS — E1142 Type 2 diabetes mellitus with diabetic polyneuropathy: Secondary | ICD-10-CM | POA: Diagnosis not present

## 2022-05-23 DIAGNOSIS — M79676 Pain in unspecified toe(s): Secondary | ICD-10-CM | POA: Diagnosis not present

## 2022-05-23 DIAGNOSIS — L84 Corns and callosities: Secondary | ICD-10-CM | POA: Diagnosis not present

## 2022-05-23 DIAGNOSIS — B351 Tinea unguium: Secondary | ICD-10-CM | POA: Diagnosis not present

## 2022-05-29 DIAGNOSIS — J449 Chronic obstructive pulmonary disease, unspecified: Secondary | ICD-10-CM | POA: Diagnosis not present

## 2022-05-29 DIAGNOSIS — M1710 Unilateral primary osteoarthritis, unspecified knee: Secondary | ICD-10-CM | POA: Diagnosis not present

## 2022-05-29 DIAGNOSIS — M6281 Muscle weakness (generalized): Secondary | ICD-10-CM | POA: Diagnosis not present

## 2022-05-30 DIAGNOSIS — R339 Retention of urine, unspecified: Secondary | ICD-10-CM | POA: Diagnosis not present

## 2022-06-02 DIAGNOSIS — E1165 Type 2 diabetes mellitus with hyperglycemia: Secondary | ICD-10-CM | POA: Diagnosis not present

## 2022-06-06 DIAGNOSIS — E1142 Type 2 diabetes mellitus with diabetic polyneuropathy: Secondary | ICD-10-CM | POA: Diagnosis not present

## 2022-06-06 DIAGNOSIS — G894 Chronic pain syndrome: Secondary | ICD-10-CM | POA: Diagnosis not present

## 2022-06-06 DIAGNOSIS — M47816 Spondylosis without myelopathy or radiculopathy, lumbar region: Secondary | ICD-10-CM | POA: Diagnosis not present

## 2022-06-06 DIAGNOSIS — M48061 Spinal stenosis, lumbar region without neurogenic claudication: Secondary | ICD-10-CM | POA: Diagnosis not present

## 2022-06-13 DIAGNOSIS — I10 Essential (primary) hypertension: Secondary | ICD-10-CM | POA: Diagnosis not present

## 2022-06-13 DIAGNOSIS — Z299 Encounter for prophylactic measures, unspecified: Secondary | ICD-10-CM | POA: Diagnosis not present

## 2022-06-13 DIAGNOSIS — Z79899 Other long term (current) drug therapy: Secondary | ICD-10-CM | POA: Diagnosis not present

## 2022-06-13 DIAGNOSIS — J449 Chronic obstructive pulmonary disease, unspecified: Secondary | ICD-10-CM | POA: Diagnosis not present

## 2022-06-13 DIAGNOSIS — Z0000 Encounter for general adult medical examination without abnormal findings: Secondary | ICD-10-CM | POA: Diagnosis not present

## 2022-06-13 DIAGNOSIS — E7800 Pure hypercholesterolemia, unspecified: Secondary | ICD-10-CM | POA: Diagnosis not present

## 2022-06-13 DIAGNOSIS — R5383 Other fatigue: Secondary | ICD-10-CM | POA: Diagnosis not present

## 2022-06-13 DIAGNOSIS — E1165 Type 2 diabetes mellitus with hyperglycemia: Secondary | ICD-10-CM | POA: Diagnosis not present

## 2022-06-15 ENCOUNTER — Other Ambulatory Visit: Payer: Self-pay | Admitting: Gastroenterology

## 2022-06-23 ENCOUNTER — Ambulatory Visit (HOSPITAL_COMMUNITY): Admission: RE | Admit: 2022-06-23 | Payer: 59 | Source: Ambulatory Visit

## 2022-06-26 DIAGNOSIS — R339 Retention of urine, unspecified: Secondary | ICD-10-CM | POA: Diagnosis not present

## 2022-06-28 ENCOUNTER — Ambulatory Visit (HOSPITAL_COMMUNITY)
Admission: RE | Admit: 2022-06-28 | Discharge: 2022-06-28 | Disposition: A | Discharge status: Home / Self Care | Payer: 59 | Source: Ambulatory Visit | Attending: Orthopedic Surgery | Admitting: Orthopedic Surgery

## 2022-06-28 DIAGNOSIS — M19012 Primary osteoarthritis, left shoulder: Secondary | ICD-10-CM | POA: Insufficient documentation

## 2022-06-28 DIAGNOSIS — M25512 Pain in left shoulder: Secondary | ICD-10-CM | POA: Diagnosis not present

## 2022-06-28 DIAGNOSIS — G8929 Other chronic pain: Secondary | ICD-10-CM | POA: Diagnosis not present

## 2022-06-29 DIAGNOSIS — M6281 Muscle weakness (generalized): Secondary | ICD-10-CM | POA: Diagnosis not present

## 2022-06-29 DIAGNOSIS — J449 Chronic obstructive pulmonary disease, unspecified: Secondary | ICD-10-CM | POA: Diagnosis not present

## 2022-06-29 DIAGNOSIS — M1710 Unilateral primary osteoarthritis, unspecified knee: Secondary | ICD-10-CM | POA: Diagnosis not present

## 2022-07-02 DIAGNOSIS — E1165 Type 2 diabetes mellitus with hyperglycemia: Secondary | ICD-10-CM | POA: Diagnosis not present

## 2022-07-04 DIAGNOSIS — M48061 Spinal stenosis, lumbar region without neurogenic claudication: Secondary | ICD-10-CM | POA: Diagnosis not present

## 2022-07-04 DIAGNOSIS — E1142 Type 2 diabetes mellitus with diabetic polyneuropathy: Secondary | ICD-10-CM | POA: Diagnosis not present

## 2022-07-04 DIAGNOSIS — M47816 Spondylosis without myelopathy or radiculopathy, lumbar region: Secondary | ICD-10-CM | POA: Diagnosis not present

## 2022-07-04 DIAGNOSIS — G894 Chronic pain syndrome: Secondary | ICD-10-CM | POA: Diagnosis not present

## 2022-07-05 ENCOUNTER — Ambulatory Visit: Payer: 59 | Attending: Cardiology | Admitting: Cardiology

## 2022-07-05 ENCOUNTER — Encounter: Payer: Self-pay | Admitting: Cardiology

## 2022-07-05 VITALS — BP 124/68 | HR 65 | Ht 67.0 in | Wt 328.0 lb

## 2022-07-05 DIAGNOSIS — I480 Paroxysmal atrial fibrillation: Secondary | ICD-10-CM

## 2022-07-05 DIAGNOSIS — I890 Lymphedema, not elsewhere classified: Secondary | ICD-10-CM

## 2022-07-05 DIAGNOSIS — G4733 Obstructive sleep apnea (adult) (pediatric): Secondary | ICD-10-CM | POA: Diagnosis not present

## 2022-07-05 NOTE — Patient Instructions (Signed)

## 2022-07-05 NOTE — Progress Notes (Signed)
Cardiology Office Note  Date: 07/05/2022   ID: [name] [name], DOB 10/26/1960, MRN [id number]  History of Present Illness: [name] [name] is a 62 y.o. female last seen in January by Ms. Philis Nettle NP, I reviewed the note.  She is here for a follow-up visit.  She does not report any progressive angina, had an episode recently that resolved with nitroglycerin.  We have continued medical therapy in light of her cardiac catheterization from 2020 showing no significant CAD (could have some microvascular angina).  No definite breakthrough atrial fibrillation, she reports no interval palpitations. Follow-up ECG today shows sinus rhythm with prolonged PR interval, leftward axis and IVCD.  I reviewed her medications which are stable from a cardiac perspective.  She remains on Xarelto for stroke prophylaxis, no spontaneous bleeding problems reported.  I reviewed her lab work from June.  Functional status has been quite limited.  She has bilateral Charcot feet, and custom support boots today.  Plans to have dental extractions in the next month and ultimately left shoulder surgery.  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  Physical Exam: VS:  BP 124/68   Pulse 65   Ht 5\' 7"  (1.702 m)   Wt (!) 328 lb (148.8 kg)   SpO2 97%   BMI 51.37 kg/m , BMI Body mass index is 51.37 kg/m.  Wt Readings from Last 3 Encounters:  07/05/22 (!) 328 lb (148.8 kg)  01/04/22 (!) 311 lb (141.1 kg)  09/20/21 (!) 325 lb 12.8 oz (147.8 kg)    General: Patient appears comfortable at rest. HEENT: Conjunctiva and lids normal. Lungs: Clear to auscultation, nonlabored breathing at rest. Cardiac: Regular rate and rhythm, no S3, 2/6 systolic murmur. Extremities: Compression stockings in place bilaterally.  Protective support boots.  ECG:  An ECG dated 07/21/2021 was personally reviewed today and demonstrated:  Sinus rhythm with leftward axis and LVH.  Labwork: 08/11/2021: BUN 8; Creatinine, Ser 0.60; Hemoglobin 11.6; Potassium  4.0; Sodium 137  June 2024: Hemoglobin 11.2, platelets 344, BUN 22, creatinine 0.9, potassium 5.3, AST 15, ALT 10, cholesterol 95, triglycerides 201, HDL 28, LDL 35, TSH 2.08  Other Studies Reviewed Today:  Echocardiogram 01/12/2022:  1. Left ventricular ejection fraction, by estimation, is 70 to 75%. The  left ventricle has hyperdynamic function. Left ventricular endocardial  border not optimally defined to evaluate regional wall motion. Left  ventricular diastolic parameters are  consistent with Grade I diastolic dysfunction (impaired relaxation).  Elevated left atrial pressure. The average left ventricular global  longitudinal strain is -31.2 %. The global longitudinal strain is normal.   2. Right ventricular systolic function is normal. The right ventricular  size is normal. There is normal pulmonary artery systolic pressure.   3. The mitral valve is normal in structure. No evidence of mitral valve  regurgitation. No evidence of mitral stenosis.   4. The aortic valve has an indeterminant number of cusps. There is mild  calcification of the aortic valve. There is mild thickening of the aortic  valve. Aortic valve regurgitation is not visualized. No aortic stenosis is  present.   5. The inferior vena cava is normal in size with greater than 50%  respiratory variability, suggesting right atrial pressure of 3 mmHg.  Assessment and Plan:  1.  Paroxysmal atrial fibrillation with CHA2DS2-VASc score of 3.  She continues to do well in terms of rhythm control on flecainide.  Continue cardia XT and Xarelto as well.  No reported spontaneous bleeding problems.  I  reviewed her recent lab work from June.  2.  Lymphedema.  Using compression stockings and remains on stable diuretic regimen.  3.  OSA on BiPAP.  She reports compliance with therapy.  4.  Intermittent chest pain responsive to nitroglycerin.  Cardiac catheterization from 2020 showed no significant CAD.  Likely has component of  microvascular angina.  Disposition:  Follow up  6 months.  Signed, Jonelle Sidle, M.D., F.A.C.C. [facility]

## 2022-07-07 DIAGNOSIS — G4733 Obstructive sleep apnea (adult) (pediatric): Secondary | ICD-10-CM | POA: Diagnosis not present

## 2022-07-07 DIAGNOSIS — J449 Chronic obstructive pulmonary disease, unspecified: Secondary | ICD-10-CM | POA: Diagnosis not present

## 2022-07-10 ENCOUNTER — Telehealth: Payer: Self-pay | Admitting: Cardiology

## 2022-07-10 NOTE — Telephone Encounter (Signed)
Spoke to pt who stated that she gets gas vouchers through [employer] to get back and forth to appointments- pt forgot to get provider to sign her voucher on 7/3, so she needs a statement faxed to [employer].

## 2022-07-10 NOTE — Telephone Encounter (Signed)
Pt is requesting a callback regarding having a letter faxed to [employer] which is an [employer] company at [phone]. They supply her with gas vouchers to get to her appts and she needs them to know she had an office visit on 07/05/2022. Please advise

## 2022-07-19 ENCOUNTER — Other Ambulatory Visit: Payer: Self-pay | Admitting: Pulmonary Disease

## 2022-07-24 ENCOUNTER — Other Ambulatory Visit: Payer: Self-pay | Admitting: Pulmonary Disease

## 2022-07-24 ENCOUNTER — Other Ambulatory Visit: Payer: Self-pay | Admitting: Cardiology

## 2022-07-25 DIAGNOSIS — M170 Bilateral primary osteoarthritis of knee: Secondary | ICD-10-CM | POA: Diagnosis not present

## 2022-07-27 DIAGNOSIS — R339 Retention of urine, unspecified: Secondary | ICD-10-CM | POA: Diagnosis not present

## 2022-07-29 DIAGNOSIS — J449 Chronic obstructive pulmonary disease, unspecified: Secondary | ICD-10-CM | POA: Diagnosis not present

## 2022-07-29 DIAGNOSIS — M6281 Muscle weakness (generalized): Secondary | ICD-10-CM | POA: Diagnosis not present

## 2022-07-29 DIAGNOSIS — M1710 Unilateral primary osteoarthritis, unspecified knee: Secondary | ICD-10-CM | POA: Diagnosis not present

## 2022-08-01 DIAGNOSIS — E1142 Type 2 diabetes mellitus with diabetic polyneuropathy: Secondary | ICD-10-CM | POA: Diagnosis not present

## 2022-08-01 DIAGNOSIS — L84 Corns and callosities: Secondary | ICD-10-CM | POA: Diagnosis not present

## 2022-08-02 DIAGNOSIS — E1165 Type 2 diabetes mellitus with hyperglycemia: Secondary | ICD-10-CM | POA: Diagnosis not present

## 2022-08-04 ENCOUNTER — Telehealth: Payer: Self-pay | Admitting: *Deleted

## 2022-08-04 NOTE — Patient Outreach (Signed)
  Care Coordination   08/04/2022 Name: [name] [name] MRN: [id number] DOB: [birth year]   Care Coordination Outreach Attempts:  An unsuccessful telephone outreach was attempted today to offer the patient information about available care coordination services.  Follow Up Plan:  Additional outreach attempts will be made to offer the patient care coordination information and services.   Encounter Outcome:  No Answer. Left HIPAA compliant VM.  Care Coordination Interventions:  No, not indicated    Demetrios Loll, BSN, RN-BC RN Care Coordinator [facility]  [facility] Direct Dial: [phone] Main #: [phone]

## 2022-08-08 DIAGNOSIS — M47816 Spondylosis without myelopathy or radiculopathy, lumbar region: Secondary | ICD-10-CM | POA: Diagnosis not present

## 2022-08-08 DIAGNOSIS — Z79891 Long term (current) use of opiate analgesic: Secondary | ICD-10-CM | POA: Diagnosis not present

## 2022-08-08 DIAGNOSIS — G894 Chronic pain syndrome: Secondary | ICD-10-CM | POA: Diagnosis not present

## 2022-08-08 DIAGNOSIS — M48061 Spinal stenosis, lumbar region without neurogenic claudication: Secondary | ICD-10-CM | POA: Diagnosis not present

## 2022-08-08 DIAGNOSIS — E1142 Type 2 diabetes mellitus with diabetic polyneuropathy: Secondary | ICD-10-CM | POA: Diagnosis not present

## 2022-08-15 ENCOUNTER — Other Ambulatory Visit: Payer: Self-pay | Admitting: Pulmonary Disease

## 2022-08-15 ENCOUNTER — Telehealth: Payer: Self-pay | Admitting: Cardiology

## 2022-08-15 NOTE — Telephone Encounter (Signed)
Patient with diagnosis of afib on Xarelto for anticoagulation.    Procedure: 3 dental extractions Date of procedure: TBD  CHA2DS2-VASc Score = 3  This indicates a 3.2% annual risk of stroke. The patient's score is based upon: CHF History: 0 HTN History: 1 Diabetes History: 1 Stroke History: 0 Vascular Disease History: 0 Age Score: 0 Gender Score: 1   CrCl >149mL/min Platelet count 319K  Patient does not require pre-op antibiotics for dental procedure.  Per office protocol, patient can hold Xarelto for 1 day prior to procedure.    **This guidance is not considered finalized until pre-operative APP has relayed final recommendations.**

## 2022-08-15 NOTE — Telephone Encounter (Signed)
   Pre-operative Risk Assessment    Patient Name: [name] [name]  DOB: 1960-04-25 MRN: [id number]     Request for Surgical Clearance    Procedure:  Dental Extraction - Amount of Teeth to be Pulled:  NOT INDICATED   Date of Surgery:  Clearance TBD                                 Surgeon:  Dr. Mat Carne May Surgeon's Group or Practice Name:  [facility]  Phone number:  [phone] Fax number:  [phone]   Type of Clearance Requested:   - Pharmacy:  Hold Warfarin (Coumadin) PRIOR TO SURGERY   Type of Anesthesia:  Local    Additional requests/questions:    Sallyanne Havers   08/15/2022, 8:10 AM

## 2022-08-15 NOTE — Telephone Encounter (Signed)
I s/w [name] [name] at dental office and confirmed how many teeth for extraction.   PROCEDURE: 3 TEETH FOR SURGICAL EXTRACTION

## 2022-08-15 NOTE — Telephone Encounter (Signed)
Need to know number of dental extractions before clearance can be provided.

## 2022-08-16 NOTE — Telephone Encounter (Signed)
   Patient Name: [name] [name]  DOB: Jan 04, 1960 MRN: [id number]  Primary Cardiologist: Nona Dell, MD  Chart reviewed as part of pre-operative protocol coverage. Pre-op clearance already addressed by colleagues in earlier phone notes. To summarize recommendations:  -Patient to undergo dental extractions under local anesthesia.  This does not require further medical clearance based on low risk overall.  She has atrial fibrillation history and anticoagulation has already been addressed by pharmacy.  -Dr. Diona Browner  Per office protocol, patient can hold Xarelto for 1 day prior to procedure.     Will route this bundled recommendation to requesting provider via Epic fax function and remove from pre-op pool. Please call with questions.  Sharlene Dory, PA-C 08/16/2022, 8:05 AM

## 2022-08-18 ENCOUNTER — Encounter: Payer: Self-pay | Admitting: *Deleted

## 2022-08-18 ENCOUNTER — Telehealth: Payer: Self-pay | Admitting: *Deleted

## 2022-08-21 ENCOUNTER — Ambulatory Visit: Payer: Self-pay | Admitting: *Deleted

## 2022-08-21 ENCOUNTER — Encounter: Payer: Self-pay | Admitting: *Deleted

## 2022-08-21 NOTE — Patient Outreach (Signed)
  Care Coordination   Follow Up Visit Note   08/21/2022 Name: [name] [name] MRN: [id number] DOB: December 29, 1960  [name] [name] is a 62 y.o. year old female who sees Vyas, Angelina Pih, MD for primary care. I spoke with  [name] [name] by phone today.  What matters to the patients health and wellness today?  Finding financial resources to help with internet cost    Goals Addressed             This Visit's Progress    Care Coordination Services       Care Coordination Goals: Patient will review and complete application for [employer] for internet cost assistance Patient will continue to talk with [facility] and [facility] Management Re: disease management Patient will reach out to RN Care Coordinator [phone] with any resource or care coordination needs        SDOH assessments and interventions completed:  No   Care Coordination Interventions:  Yes, provided   Interventions Today    Flowsheet Row Most Recent Value  Chronic Disease   Chronic disease during today's visit Diabetes, Hypertension (HTN)  General Interventions   General Interventions Discussed/Reviewed General Interventions Discussed, General Interventions Reviewed  Wheelchair Motorized, Electrical engineer --  [application for lifeline mailed to assist with cost of internet]  Education Interventions   Education Provided Provided Therapist, sports, Provided Education  [Printed education on and application for Lif[employer] to assist with internet cost]  Provided Verbal Education On [employer], Public librarian --  [application for lifeline mailed to assist with cost of internet]     Follow up plan: Follow up call scheduled for 09/22/22    Encounter Outcome:  Pt. Visit Completed   Demetrios Loll, BSN, RN-BC RN Care Coordinator [facility]  [facility] Direct Dial: [phone] Main #: [phone]

## 2022-08-21 NOTE — Patient Outreach (Signed)
Care Coordination   Initial Visit Note   08/18/2022 Name: CERIAH GUIBORD MRN: [id number] DOB: 1960-01-23  AZELEA MACAPAGAL is a 62 y.o. year old female who sees Vyas, Angelina Pih, MD for primary care. I spoke with  Sherrilee Gilles by phone today.  Ms. Marvin was given information about Care Coordination services today including:   The Care Coordination services include support from the care team which includes your Nurse Coordinator, Clinical Social Worker, or Pharmacist.  The Care Coordination team is here to help remove barriers to the health concerns and goals most important to you. Care Coordination services are voluntary, and the patient may decline or stop services at any time by request to their care team member.   Care Coordination Consent Status: Patient agreed to services and verbal consent obtained.   What matters to the patients health and wellness today?  Getting assistance for internet cost    Goals Addressed             This Visit's Progress    Care Coordination Services   Not on track    Care Coordination Goals: Find out more regarding financial assistance for internet service Reach out to RN Care Coordinator with any resource or care coordination needs [phone]         SDOH assessments and interventions completed:  Yes  SDOH Interventions Today    Flowsheet Row Most Recent Value  SDOH Interventions   Food Insecurity Interventions Intervention Not Indicated  Housing Interventions Intervention Not Indicated  Transportation Interventions Intervention Not Indicated  Financial Strain Interventions Intervention Not Indicated  Physical Activity Interventions Patient Declined  Health Literacy Interventions Intervention Not Indicated        Care Coordination Interventions:  Yes, provided  Interventions Today    Flowsheet Row Most Recent Value  Chronic Disease   Chronic disease during today's visit Diabetes, Hypertension (HTN)  General Interventions    General Interventions Discussed/Reviewed General Interventions Discussed, General Interventions Reviewed, Durable Medical Equipment (DME), Labs, Doctor Visits, Community Resources  Labs Hgb A1c every 3 months  Doctor Visits Discussed/Reviewed Doctor Visits Discussed, Doctor Visits Reviewed, Annual Wellness Visits, PCP, Specialist  [Patient is connected with a healthcare advocate through [employer] that calls her every 3 months. She is also connected with a program through Dr Sherril Croon office.]  Durable Medical Equipment (DME) BP Cuff, Glucomoter, Wheelchair, Other  [employer] chair. Recommended shower bench if landlord is unable to install walkin shower. No blood pressure or blood sugar readings available for this cold call. Blood sugar results are sent directly to PCP office each morning.]  Wheelchair Motorized  PCP/Specialist Visits Compliance with follow-up visit  [Last visit with PCP was 06/13/22. Follow-up every 3 months.]  Exercise Interventions   Exercise Discussed/Reviewed Exercise Discussed, Exercise Reviewed, Physical Activity, Assistive device use and maintanence  Physical Activity Discussed/Reviewed Physical Activity Discussed, Physical Activity Reviewed  [uses motorized wheelchair. Has a CAP aide 40 hours per week. Also has assistance from neigbors, children, and sister as needed for ADLs and IADLs.]  Education Interventions   Education Provided Provided Therapist, sports, Provided Education  [printed education on exercises to do while seated]  Provided Verbal Education On Blood Sugar Monitoring, [employer], Mental Health/Coping with Illness, When to see the doctor, Nutrition, Labs, Medication, Exercise, Other  [encouraged daily blood pressure checks, information on Assurance for mobile phone or internet. Has [employer] and understands benefits of card.]  Labs Reviewed Hgb A1c  [06/13/22 A1C 6.8%]  Nutrition Interventions  Nutrition Discussed/Reviewed Nutrition Discussed, Nutrition  Reviewed, Carbohydrate meal planning, Portion sizes, Fluid intake  [encouraged 3 meals per day with 30 GM of CHO and up to 2 snacks per day with less than 15 GM of CHO]  Pharmacy Interventions   Pharmacy Dicussed/Reviewed Pharmacy Topics Discussed, Pharmacy Topics Reviewed, Medications and their functions  [patient is taking medications regularly without any concerns]  Safety Interventions   Safety Discussed/Reviewed Safety Discussed, Fall Risk, Safety Reviewed, Home Safety  Home Safety Assistive Devices  [Patint has requested walk-in shower installation from landlord. Has shower chair but not a shower bench. Shower bench would be safer for getting in the tub. Uses wheelchair. Has a wheelchair lift on her car.]       Follow up plan: Follow up call scheduled for 08/21/22 to discuss internet assistance.    Encounter Outcome:  Pt. Visit Completed   Demetrios Loll, BSN, RN-BC RN Care Coordinator [facility]  [facility] Direct Dial: [phone] Main #: [phone]

## 2022-08-29 DIAGNOSIS — M6281 Muscle weakness (generalized): Secondary | ICD-10-CM | POA: Diagnosis not present

## 2022-08-29 DIAGNOSIS — R339 Retention of urine, unspecified: Secondary | ICD-10-CM | POA: Diagnosis not present

## 2022-08-29 DIAGNOSIS — M170 Bilateral primary osteoarthritis of knee: Secondary | ICD-10-CM | POA: Diagnosis not present

## 2022-08-29 DIAGNOSIS — J449 Chronic obstructive pulmonary disease, unspecified: Secondary | ICD-10-CM | POA: Diagnosis not present

## 2022-08-29 DIAGNOSIS — M1710 Unilateral primary osteoarthritis, unspecified knee: Secondary | ICD-10-CM | POA: Diagnosis not present

## 2022-09-02 DIAGNOSIS — E1165 Type 2 diabetes mellitus with hyperglycemia: Secondary | ICD-10-CM | POA: Diagnosis not present

## 2022-09-05 DIAGNOSIS — M47816 Spondylosis without myelopathy or radiculopathy, lumbar region: Secondary | ICD-10-CM | POA: Diagnosis not present

## 2022-09-05 DIAGNOSIS — G894 Chronic pain syndrome: Secondary | ICD-10-CM | POA: Diagnosis not present

## 2022-09-05 DIAGNOSIS — E1142 Type 2 diabetes mellitus with diabetic polyneuropathy: Secondary | ICD-10-CM | POA: Diagnosis not present

## 2022-09-05 DIAGNOSIS — M48061 Spinal stenosis, lumbar region without neurogenic claudication: Secondary | ICD-10-CM | POA: Diagnosis not present

## 2022-09-12 DIAGNOSIS — M25512 Pain in left shoulder: Secondary | ICD-10-CM | POA: Diagnosis not present

## 2022-09-18 ENCOUNTER — Telehealth: Payer: Self-pay

## 2022-09-18 ENCOUNTER — Telehealth: Payer: Self-pay | Admitting: Cardiology

## 2022-09-18 NOTE — Telephone Encounter (Signed)
Pre-operative Risk Assessment    Patient Name: RUS KRISHNASWAMY  DOB: September 21, 1960 MRN: [id number]{ Request for Surgical Clearance    Procedure:   Left Reverse Total shoulder Replacement  Date of Surgery:  Clearance TBD                                 Surgeon:  Ramond Marrow , M.D.  Surgeon's Group or Practice Name:  [facility] Phone number:  [phone]  [phone] Fax number:  [phone]   Type of Clearance Requested:   - Medical    Type of Anesthesia:  General with intersalene block    Additional requests/questions:    Sallyanne Havers   09/18/2022, 11:00 AM

## 2022-09-18 NOTE — Telephone Encounter (Signed)
Pt is scheduled for tele on 09/28/22 at 9:40am. Med rec and consent done    Patient Consent for Virtual Visit        [name] [name] has provided verbal consent on 09/18/2022 for a virtual visit (video or telephone).   CONSENT FOR VIRTUAL VISIT FOR:  [name] [name]  By participating in this virtual visit I agree to the following:  I hereby voluntarily request, consent and authorize [facility] and its employed or contracted physicians, physician assistants, nurse practitioners or other licensed health care professionals (the Practitioner), to provide me with telemedicine health care services (the "Services") as deemed necessary by the treating Practitioner. I acknowledge and consent to receive the Services by the Practitioner via telemedicine. I understand that the telemedicine visit will involve communicating with the Practitioner through live audiovisual communication technology and the disclosure of certain medical information by electronic transmission. I acknowledge that I have been given the opportunity to request an in-person assessment or other available alternative prior to the telemedicine visit and am voluntarily participating in the telemedicine visit.  I understand that I have the right to withhold or withdraw my consent to the use of telemedicine in the course of my care at any time, without affecting my right to future care or treatment, and that the Practitioner or I may terminate the telemedicine visit at any time. I understand that I have the right to inspect all information obtained and/or recorded in the course of the telemedicine visit and may receive copies of available information for a reasonable fee.  I understand that some of the potential risks of receiving the Services via telemedicine include:  Delay or interruption in medical evaluation due to technological equipment failure or disruption; Information transmitted may not be sufficient (e.g. poor resolution of  images) to allow for appropriate medical decision making by the Practitioner; and/or  In rare instances, security protocols could fail, causing a breach of personal health information.  Furthermore, I acknowledge that it is my responsibility to provide information about my medical history, conditions and care that is complete and accurate to the best of my ability. I acknowledge that Practitioner's advice, recommendations, and/or decision may be based on factors not within their control, such as incomplete or inaccurate data provided by me or distortions of diagnostic images or specimens that may result from electronic transmissions. I understand that the practice of medicine is not an exact science and that Practitioner makes no warranties or guarantees regarding treatment outcomes. I acknowledge that a copy of this consent can be made available to me via my patient portal [facility] MyChart), or I can request a printed copy by calling the office of [facility].    I understand that my insurance will be billed for this visit.   I have read or had this consent read to me. I understand the contents of this consent, which adequately explains the benefits and risks of the Services being provided via telemedicine.  I have been provided ample opportunity to ask questions regarding this consent and the Services and have had my questions answered to my satisfaction. I give my informed consent for the services to be provided through the use of telemedicine in my medical care

## 2022-09-18 NOTE — Telephone Encounter (Signed)
Pt is scheduled for tele on 09/28/22 at 9:40am. Med rec and consent done

## 2022-09-18 NOTE — Telephone Encounter (Signed)
Name: [name] [name]  DOB: 08/08/1960  MRN: [id number]  Primary Cardiologist: Nona Dell, MD  Chart reviewed as part of pre-operative protocol coverage. Because of [name]'s past medical history and time since last visit, she will require a follow-up telephone visit in order to better assess preoperative cardiovascular risk.  Pre-op covering staff: - Please schedule appointment and call patient to inform them. If patient already had an upcoming appointment within acceptable timeframe, please add "pre-op clearance" to the appointment notes so provider is aware. - Please contact requesting surgeon's office via preferred method (i.e, phone, fax) to inform them of need for appointment prior to surgery.  No medications indicated as needing held.    Sharlene Dory, PA-C  09/18/2022, 12:15 PM

## 2022-09-21 ENCOUNTER — Other Ambulatory Visit: Payer: Self-pay | Admitting: Cardiology

## 2022-09-22 ENCOUNTER — Ambulatory Visit: Payer: Self-pay | Admitting: *Deleted

## 2022-09-22 ENCOUNTER — Encounter: Payer: Self-pay | Admitting: *Deleted

## 2022-09-22 DIAGNOSIS — R339 Retention of urine, unspecified: Secondary | ICD-10-CM | POA: Diagnosis not present

## 2022-09-23 NOTE — Patient Outreach (Signed)
Care Coordination   Follow Up Visit Note   09/22/2022 Name: [name] [name] MRN: [id number] DOB: 05/29/1960  [name] [name] is a 62 y.o. year old female who sees Vyas, Angelina Pih, MD for primary care. I spoke with  Sherrilee Gilles by phone today. Patient is established with [facility] Management Re: disease management.   What matters to the patients health and wellness today?  No resource or care coordination needs    Goals Addressed             This Visit's Progress    COMPLETED: Care Coordination Services       Care Coordination Goals: Patient will continue to talk with [facility] and [facility] Management Re: disease management Patient will reach out to RN Care Coordinator [phone] with any resource or care coordination needs         SDOH assessments and interventions completed:  Yes  SDOH Interventions Today    Flowsheet Row Most Recent Value  SDOH Interventions   Transportation Interventions Intervention Not Indicated  Financial Strain Interventions Intervention Not Indicated        Care Coordination Interventions:  No, not indicated   Follow up plan: No further intervention required. Patient can reach out to RN Care Management Coordinator with any future resource or care coordination needs.  Encounter Outcome:  Patient Visit Completed   Demetrios Loll, RN, BSN Care Management Coordinator [facility]  [facility] Direct Dial: [phone] Main #: [phone]

## 2022-09-28 ENCOUNTER — Ambulatory Visit: Payer: 59 | Attending: Cardiology

## 2022-09-28 DIAGNOSIS — Z01810 Encounter for preprocedural cardiovascular examination: Secondary | ICD-10-CM

## 2022-09-28 NOTE — Telephone Encounter (Signed)
Patient with diagnosis of A Fib on Xarelto for anticoagulation.    Procedure: Left Reverse Total shoulder Replacement  Date of procedure: TBD   CHA2DS2-VASc Score = 3  This indicates a 3.2% annual risk of stroke. The patient's score is based upon: CHF History: 0 HTN History: 1 Diabetes History: 1 Stroke History: 0 Vascular Disease History: 0 Age Score: 0 Gender Score: 1    CrCl 147 ml/min using adj body weight Platelet count 319K   Per office protocol, patient can hold Xarelto for 3 days prior to procedure. .  **This guidance is not considered finalized until pre-operative APP has relayed final recommendations.**

## 2022-09-28 NOTE — Progress Notes (Signed)
Virtual Visit via Telephone Note   Because of [name] [name]'s co-morbid illnesses, she is at least at moderate risk for complications without adequate follow up.  This format is felt to be most appropriate for this patient at this time.  The patient did not have access to video technology/had technical difficulties with video requiring transitioning to audio format only (telephone).  All issues noted in this document were discussed and addressed.  No physical exam could be performed with this format.  Please refer to the patient's chart for her consent to telehealth for [facility].  Evaluation Performed:  Preoperative cardiovascular risk assessment _____________   Date:  09/28/2022   Patient ID:  [name] [name], DOB 1960/03/05, MRN [id number] Patient Location:  Home Provider location:   Office  Primary Care Provider:  Ignatius Specking, MD Primary Cardiologist:  Nona Dell, MD  Chief Complaint / Patient Profile   62 y.o. y/o female with a h/o intermittent chest pain despite having no significant CAD on previous cath in 2020, lymphedema, PAF on flecainide and Xarelto and OSA on BiPAP who is pending left reverse total shoulder replacement by Dr. Everardo Pacific and presents today for telephonic preoperative cardiovascular risk assessment.  History of Present Illness    [name] [name] is a 62 y.o. female who presents via audio/video conferencing for a telehealth visit today.  Pt was last seen in cardiology clinic on 07/05/2022 by Dr. Nona Dell.  At that time [name] [name] was doing well.  The patient is now pending procedure as outlined above. Since her last visit, she has been doing well without chest pain or shortness of breath.  Her functional ability has been limited due to [name] [name] and she gets around with a electrical wheelchair.  Previous cardiac catheterization in 2020 showed clean coronary arteries.  Past Medical History    Past Medical History:  Diagnosis Date    Bladder dysfunction    Self urinary catheterization   Complication of anesthesia    COPD (chronic obstructive pulmonary disease) (HCC)    DDD (degenerative disc disease)    Depression    Esophageal spasm    NTG and Norvasc   Essential hypertension    Fibromyalgia    Gastroparesis    GERD (gastroesophageal reflux disease)    History of cardiac catheterization    Normal coronary arteries   History of pituitary tumor    Irritable bowel syndrome    Lymphedema    Osteoarthritis    PAF (paroxysmal atrial fibrillation) (HCC)    PONV (postoperative nausea and vomiting)    Sleep apnea    BIPAP   Type 2 diabetes mellitus (HCC)    Urinary tract infection    Past Surgical History:  Procedure Laterality Date   ABDOMINAL HYSTERECTOMY     ANTERIOR CERVICAL DECOMP/DISCECTOMY FUSION N/A 12/02/2014   Procedure: Cervical five cervical six anterior cervical decompression with fusion interbody prosthesis plating and bone graft;  Surgeon: Tressie Stalker, MD;  Location: [facility];  Service: Neurosurgery;  Laterality: N/A;  C56 anterior cervical decompression with fusion interbody prosthesis plating and bonegraft   APPENDECTOMY     BACK SURGERY     CARDIAC CATHETERIZATION     CARDIOVERSION N/A 03/26/2014   Procedure: CARDIOVERSION;  Surgeon: Laqueta Linden, MD;  Location: [facility] ORS;  Service: Endoscopy;  Laterality: N/A;   CARDIOVERSION N/A 05/04/2014   Procedure: CARDIOVERSION;  Surgeon: Jonelle Sidle, MD;  Location: [facility] ORS;  Service: Cardiovascular;  Laterality:  N/A;   CHOLECYSTECTOMY     COLONOSCOPY     Approximately 2003. Per medical records, internal hemorrhoids noted   COLONOSCOPY N/A 06/25/2013   Dr. Jena Gauss: Anal canal hemorrhoids-more likely the source of paper hematochezia. Redundant, capacious colon. Multiple colonic polyps-tubular adenoma   COLONOSCOPY WITH PROPOFOL N/A 12/04/2016   Dr. Jena Gauss: redundant colon, colonic diverticulosis. Screening due in 2023   COLONOSCOPY WITH  PROPOFOL N/A 08/11/2021   Procedure: COLONOSCOPY WITH PROPOFOL;  Surgeon: Corbin Ade, MD;  Location: [facility];  Service: Endoscopy;  Laterality: N/A;  9:15am   ENDOVENOUS ABLATION SAPHENOUS VEIN W/ LASER Left 03/07/2018   endovenous ablation left greater saphenous vein by Waverly Ferrari MD    ESOPHAGOGASTRODUODENOSCOPY  10/12/2004   KGM:[facility] plaquing on the esophageal mucosa of uncertain significance, not typical of what is seen with candida esophagitis status post KOH brushing for KOH prep and biopsy for histology. Rule out candida esophagitis/eosinophilic esophagitis. Otherwise normal esophagus. Tiny hiatal hernia. Otherwise, normal stomach, normal D1 and D2. Benign biopsy of esophagus, unknown KOH status.    ESOPHAGOGASTRODUODENOSCOPY N/A 06/25/2013   Dr. Rourk:mild chronic gastritis   KNEE SURGERY     LEFT HEART CATHETERIZATION WITH CORONARY ANGIOGRAM N/A 11/15/2010   Procedure: LEFT HEART CATHETERIZATION WITH CORONARY ANGIOGRAM;  Surgeon: Pamella Pert, MD;  Location: [facility] CATH LAB;  Service: Cardiovascular;  Laterality: N/A;   LUNG BIOPSY     POLYPECTOMY  08/11/2021   Procedure: POLYPECTOMY INTESTINAL;  Surgeon: Corbin Ade, MD;  Location: [facility];  Service: Endoscopy;;   RIGHT/LEFT HEART CATH AND CORONARY ANGIOGRAPHY N/A 08/28/2018   Procedure: RIGHT/LEFT HEART CATH AND CORONARY ANGIOGRAPHY;  Surgeon: Kathleene Hazel, MD;  Location: [facility] INVASIVE CV LAB;  Service: Cardiovascular;  Laterality: N/A;    Allergies  Allergies  Allergen Reactions   Tape Other (See Comments)    Welts, bad place on skin. Paper tape is okay   Hyoscyamine Sulfate Palpitations   Metoclopramide Other (See Comments)    tremors    Home Medications    Prior to Admission medications   Medication Sig Start Date End Date Taking? Authorizing Provider  albuterol (PROVENTIL) (2.5 MG/3ML) 0.083% nebulizer solution Take 2.5 mg by nebulization every 6 (six) hours as needed for wheezing.  For shortness of breath.    [provider]  albuterol (VENTOLIN HFA) 108 (90 Base) MCG/ACT inhaler INHALE TWO PUFFS INTO THE LUNGS EVERY 6 HOURS AS NEEDED WHEEZING OR SHORTNESS OF BREATH 07/24/22   Oretha Milch, MD  [facility] ELLIPTA 62.5-25 MCG/ACT AEPB INHALE 1 PUFF BY MOUTH EVERY DAY 07/19/22   Oretha Milch, MD  atorvastatin (LIPITOR) 40 MG tablet Take 40 mg by mouth daily. 07/06/20   [provider]  baclofen (LIORESAL) 20 MG tablet Take 20 mg by mouth 3 (three) times daily.    [provider]  CARTIA XT 120 MG 24 hr capsule TAKE ONE CAPSULE BY MOUTH EVERY DAY 09/22/22   Jonelle Sidle, MD  DEXILANT 60 MG capsule TAKE ONE CAPSULE BY MOUTH EVERY DAY 12/20/21   Gelene Mink, NP  diclofenac sodium (VOLTAREN) 1 % GEL Apply 2-4 g topically 4 (four) times daily as needed (for pain).     [provider]  docusate sodium (COLACE) 100 MG capsule Take 100 mg by mouth 2 (two) times daily.    [provider]  famotidine (PEPCID) 20 MG tablet TAKE 1 TABLET BY MOUTH EVERY DAY AS NEEDED FOR HEARTBURN OR INDIGESTION 10/09/18  Anice Paganini, NP  flecainide (TAMBOCOR) 50 MG tablet TAKE 2 TABLETS BY MOUTH EVERY MORNING and TAKE 2 TABLETS BY MOUTH EVERY EVENING 04/12/22   Jonelle Sidle, MD  fluticasone [facility]) 50 MCG/ACT nasal spray INSTILL 2 SPRAYS IN EACH NOSTRIL EVERY DAY 08/18/22   Oretha Milch, MD  furosemide (LASIX) 80 MG tablet Take 80 mg by mouth 2 (two) times daily.    [provider]  gabapentin (NEURONTIN) 800 MG tablet Take 800 mg by mouth 4 (four) times daily.  10/09/17   [provider]  glimepiride (AMARYL) 4 MG tablet Take 4 mg by mouth 2 (two) times daily. 08/16/18   [provider]  hydrocortisone (ANUSOL-HC) 2.5 % rectal cream Place 1 application rectally 2 (two) times daily. Patient taking differently: Place 1 application  rectally 2 (two) times daily as needed for hemorrhoids. 10/24/17   Gelene Mink, NP  isosorbide  mononitrate (IMDUR) 120 MG 24 hr tablet Take 120 mg by mouth daily. In the morning    [provider]  isosorbide mononitrate (IMDUR) 30 MG 24 hr tablet Take 30 mg by mouth every evening.    [provider]  LINZESS 290 MCG CAPS capsule TAKE ONE CAPSULE BY MOUTH EVERY DAY BEFORE BREAKFAST - REPLACES MOVANTIK 10/20/21   Gelene Mink, NP  lisinopril (PRINIVIL,ZESTRIL) 10 MG tablet Take 10 mg by mouth every evening.     [provider]  loratadine (CLARITIN) 10 MG tablet Take 1 tablet (10 mg total) by mouth daily. 11/25/19   Oretha Milch, MD  meclizine (ANTIVERT) 25 MG tablet Take 25 mg by mouth 3 (three) times daily. 07/31/13   [provider]  metFORMIN (GLUCOPHAGE) 500 MG tablet Take 1,000 mg by mouth 2 (two) times daily. Take 1,000mg  in the Morning and 500 mg in the evening 05/18/13   [provider]  metolazone (ZAROXOLYN) 2.5 MG tablet Take 5 mg by mouth daily with breakfast.    [provider]  MOTEGRITY 2 MG TABS TAKE 1 TABLET BY MOUTH DAILY 06/15/22   Rourk, Gerrit Friends, MD  Multiple Vitamin (MULTIVITAMIN WITH MINERALS) TABS tablet Take 1 tablet by mouth daily.    [provider]  mupirocin ointment (BACTROBAN) 2 % Apply 1 Application topically in the morning and at bedtime. 06/30/21   [provider]  nitroGLYCERIN (NITROLINGUAL) 0.4 MG/SPRAY spray USE 1 SPRAY UNDER THE TONGUE EVERY 5 MINUTES UP TO 3 DOSES AS NEEDED FOR CHEST PAIN. GO TO ER IF NOT RESOLVED 07/25/22   Jonelle Sidle, MD  NUCYNTA ER 100 MG 12 hr tablet Take 100 mg by mouth 2 (two) times daily. 12/04/19   [provider]  nystatin (MYCOSTATIN/NYSTOP) 100000 UNIT/GM POWD Apply 1 g topically daily.    [provider]  ondansetron (ZOFRAN) 4 MG tablet TAKE 1 TABLET BY MOUTH EVERY 8 HOURS AS NEEDED FOR NAUSEA AND VOMITING 01/03/22   Gelene Mink, NP  ondansetron (ZOFRAN-ODT) 8 MG disintegrating tablet Take 8 mg by mouth every 8 (eight) hours as  needed for refractory nausea / vomiting.    [provider]  OTEZLA 30 MG TABS Take 1 tablet by mouth 2 (two) times daily.    [provider]  oxyCODONE-acetaminophen (PERCOCET) 10-325 MG per tablet Take 1 tablet by mouth every 4 (four) hours as needed for pain.    [provider]  OXYGEN Inhale 2 L into the lungs as needed (shortness of breath).    [provider]  OZEMPIC, 1 MG/DOSE, 4 MG/3ML SOPN Inject 1 mg into the skin every Tuesday. 07/28/21   [provider]  polyethylene glycol-electrolytes (NULYTELY) 420 g solution As directed 06/10/21   Rourk, Gerrit Friends, MD  potassium chloride SA (K-DUR,KLOR-CON) 20 MEQ tablet Take 40 mEq by mouth 5 (five) times daily.    [provider]  rivaroxaban (XARELTO) 20 MG TABS tablet Take 20 mg by mouth daily with supper.     [provider]  Semaglutide,0.25 or 0.5MG /DOS, 2 MG/1.5ML SOPN Inject 0.25 mg into the skin every Tuesday. At lunch    [provider]  sodium chloride (OCEAN) 0.65 % SOLN nasal spray Place 1 spray into both nostrils as needed for congestion.    [provider]  spironolactone (ALDACTONE) 50 MG tablet Take 50 mg by mouth 3 (three) times daily.    [provider]  TOUJEO SOLOSTAR 300 UNIT/ML Solostar Pen Inject 46 Units into the skin at bedtime. 11/12/19   [provider]  VASCEPA 1 g CAPS Take 1 g by mouth 4 (four) times daily. 08/16/18   [provider]    Physical Exam    Vital Signs:  [name] [name] does not have vital signs available for review today.  Given telephonic nature of communication, physical exam is limited. AAOx3. NAD. Normal affect.  Speech and respirations are unlabored.  Accessory Clinical Findings    None  Assessment & Plan    1.  Preoperative Cardiovascular Risk Assessment:  -Patient is largely wheelchair-bound due to significant knee issue.  She has a previous cardiac catheterization in 2020 that showed  normal coronary arteries.  She has no chest pain or shortness of breath.  Recent echocardiogram obtained in January 2024 showed normal EF.  She is at acceptable risk to proceed with upcoming surgery from the cardiac perspective.  -The case was reviewed by our clinical pharmacist who recommended to hold the Xarelto for 3 days.  I have instructed the patient to hold Xarelto for 3 days prior to the procedure and restart as soon as possible afterward at the surgeon's discretion based on the bleeding risk.   The patient was advised that if she develops new symptoms prior to surgery to contact our office to arrange for a follow-up visit, and she verbalized understanding.   A copy of this note will be routed to requesting surgeon.  Time:   Today, I have spent 8 minutes with the patient with telehealth technology discussing medical history, symptoms, and management plan.     [place], [place]  09/28/2022, 4:43 PM

## 2022-09-28 NOTE — Telephone Encounter (Signed)
Clinical pharmacist to review Xarelto 

## 2022-09-28 NOTE — Telephone Encounter (Signed)
Patient was cleared via virtual telephone visit.  See today's note for final clearance.  I have asked callback pool staff to send my note to the surgeon's office as the return fax number is incorrect.

## 2022-09-29 DIAGNOSIS — J449 Chronic obstructive pulmonary disease, unspecified: Secondary | ICD-10-CM | POA: Diagnosis not present

## 2022-09-29 DIAGNOSIS — M6281 Muscle weakness (generalized): Secondary | ICD-10-CM | POA: Diagnosis not present

## 2022-09-29 DIAGNOSIS — M1710 Unilateral primary osteoarthritis, unspecified knee: Secondary | ICD-10-CM | POA: Diagnosis not present

## 2022-10-02 DIAGNOSIS — E1165 Type 2 diabetes mellitus with hyperglycemia: Secondary | ICD-10-CM | POA: Diagnosis not present

## 2022-10-05 DIAGNOSIS — M47816 Spondylosis without myelopathy or radiculopathy, lumbar region: Secondary | ICD-10-CM | POA: Diagnosis not present

## 2022-10-05 DIAGNOSIS — E1142 Type 2 diabetes mellitus with diabetic polyneuropathy: Secondary | ICD-10-CM | POA: Diagnosis not present

## 2022-10-05 DIAGNOSIS — G894 Chronic pain syndrome: Secondary | ICD-10-CM | POA: Diagnosis not present

## 2022-10-05 DIAGNOSIS — M48061 Spinal stenosis, lumbar region without neurogenic claudication: Secondary | ICD-10-CM | POA: Diagnosis not present

## 2022-10-06 DIAGNOSIS — G4733 Obstructive sleep apnea (adult) (pediatric): Secondary | ICD-10-CM | POA: Diagnosis not present

## 2022-10-06 DIAGNOSIS — J449 Chronic obstructive pulmonary disease, unspecified: Secondary | ICD-10-CM | POA: Diagnosis not present

## 2022-10-10 DIAGNOSIS — E1165 Type 2 diabetes mellitus with hyperglycemia: Secondary | ICD-10-CM | POA: Diagnosis not present

## 2022-10-10 DIAGNOSIS — Z299 Encounter for prophylactic measures, unspecified: Secondary | ICD-10-CM | POA: Diagnosis not present

## 2022-10-10 DIAGNOSIS — M25512 Pain in left shoulder: Secondary | ICD-10-CM | POA: Diagnosis not present

## 2022-10-10 DIAGNOSIS — I5032 Chronic diastolic (congestive) heart failure: Secondary | ICD-10-CM | POA: Diagnosis not present

## 2022-10-10 DIAGNOSIS — E1142 Type 2 diabetes mellitus with diabetic polyneuropathy: Secondary | ICD-10-CM | POA: Diagnosis not present

## 2022-10-10 DIAGNOSIS — Z79899 Other long term (current) drug therapy: Secondary | ICD-10-CM | POA: Diagnosis not present

## 2022-10-10 DIAGNOSIS — R5383 Other fatigue: Secondary | ICD-10-CM | POA: Diagnosis not present

## 2022-10-10 DIAGNOSIS — Z23 Encounter for immunization: Secondary | ICD-10-CM | POA: Diagnosis not present

## 2022-10-15 ENCOUNTER — Other Ambulatory Visit: Payer: Self-pay | Admitting: Gastroenterology

## 2022-10-16 NOTE — Telephone Encounter (Signed)
Pt needs an appt to be seen before anymore refills

## 2022-10-19 DIAGNOSIS — L84 Corns and callosities: Secondary | ICD-10-CM | POA: Diagnosis not present

## 2022-10-19 DIAGNOSIS — B351 Tinea unguium: Secondary | ICD-10-CM | POA: Diagnosis not present

## 2022-10-19 DIAGNOSIS — E1142 Type 2 diabetes mellitus with diabetic polyneuropathy: Secondary | ICD-10-CM | POA: Diagnosis not present

## 2022-10-19 DIAGNOSIS — M79676 Pain in unspecified toe(s): Secondary | ICD-10-CM | POA: Diagnosis not present

## 2022-10-29 DIAGNOSIS — M6281 Muscle weakness (generalized): Secondary | ICD-10-CM | POA: Diagnosis not present

## 2022-10-29 DIAGNOSIS — M1710 Unilateral primary osteoarthritis, unspecified knee: Secondary | ICD-10-CM | POA: Diagnosis not present

## 2022-10-29 DIAGNOSIS — J449 Chronic obstructive pulmonary disease, unspecified: Secondary | ICD-10-CM | POA: Diagnosis not present

## 2022-11-01 DIAGNOSIS — E1165 Type 2 diabetes mellitus with hyperglycemia: Secondary | ICD-10-CM | POA: Diagnosis not present

## 2022-11-07 DIAGNOSIS — M47816 Spondylosis without myelopathy or radiculopathy, lumbar region: Secondary | ICD-10-CM | POA: Diagnosis not present

## 2022-11-07 DIAGNOSIS — M48061 Spinal stenosis, lumbar region without neurogenic claudication: Secondary | ICD-10-CM | POA: Diagnosis not present

## 2022-11-07 DIAGNOSIS — G894 Chronic pain syndrome: Secondary | ICD-10-CM | POA: Diagnosis not present

## 2022-11-07 DIAGNOSIS — E1142 Type 2 diabetes mellitus with diabetic polyneuropathy: Secondary | ICD-10-CM | POA: Diagnosis not present

## 2022-11-12 ENCOUNTER — Other Ambulatory Visit: Payer: Self-pay | Admitting: Cardiology

## 2022-11-14 ENCOUNTER — Encounter: Payer: Self-pay | Admitting: Gastroenterology

## 2022-11-14 ENCOUNTER — Ambulatory Visit (INDEPENDENT_AMBULATORY_CARE_PROVIDER_SITE_OTHER): Payer: 59 | Admitting: Gastroenterology

## 2022-11-14 VITALS — BP 108/65 | HR 66 | Temp 98.2°F | Ht 68.0 in | Wt 328.0 lb

## 2022-11-14 DIAGNOSIS — K625 Hemorrhage of anus and rectum: Secondary | ICD-10-CM | POA: Diagnosis not present

## 2022-11-14 DIAGNOSIS — K219 Gastro-esophageal reflux disease without esophagitis: Secondary | ICD-10-CM | POA: Diagnosis not present

## 2022-11-14 DIAGNOSIS — K649 Unspecified hemorrhoids: Secondary | ICD-10-CM

## 2022-11-14 DIAGNOSIS — K5900 Constipation, unspecified: Secondary | ICD-10-CM | POA: Diagnosis not present

## 2022-11-14 MED ORDER — MOTEGRITY 2 MG PO TABS: 1.0000 | ORAL_TABLET | Freq: Every day | ORAL | 3 refills | Status: DC

## 2022-11-14 MED ORDER — DEXLANSOPRAZOLE 60 MG PO CPDR: 60.0000 mg | DELAYED_RELEASE_CAPSULE | Freq: Every day | ORAL | 3 refills | Status: DC

## 2022-11-14 MED ORDER — HYDROCORTISONE (PERIANAL) 2.5 % EX CREA: 1.0000 | TOPICAL_CREAM | Freq: Two times a day (BID) | CUTANEOUS | 1 refills | Status: AC

## 2022-11-14 MED ORDER — LINACLOTIDE 290 MCG PO CAPS: 290.0000 ug | ORAL_CAPSULE | Freq: Every day | ORAL | 3 refills | Status: DC

## 2022-11-14 NOTE — Progress Notes (Signed)
Gastroenterology Office Note     Primary Care Physician:  Ignatius Specking, MD  Primary Gastroenterologist: Dr. Jena Gauss    Chief Complaint   Chief Complaint  Patient presents with   Follow-up    Follow up on GERD and need refills     History of Present Illness   KENLEIGH [name] is a 62 y.o. female presenting today with a history of  OIC, GERD, gastroparesis, colon polyps,  for routine follow-up.    Motegrity, linzess 290 mcg, 2 stool softeners a day, 3 caps of Miralax in am. Cheese will stop her up.  This regimen works best for her constipation after trying multiple other therapies.   Dexilant once daily. Adds Pepcid prn. Will have bleeding with hemorrhoids if diarrhea. She would like to have Anusol on hand.   Colonoscopy Aug 2023: Three 4 to 5 mm polyps in the descending colon                            and at the hepatic flexure, removed with a cold                            snare. Resected and retrieved. Colonic lipoma.                            Redundant and elongated colon.                           - The examination was otherwise normal on direct                            and retroflexion views. Tubular adenoma and hyperplastic polyp. Surveillance 5 years.   Past Medical History:  Diagnosis Date   Bladder dysfunction    Self urinary catheterization   Complication of anesthesia    COPD (chronic obstructive pulmonary disease) (HCC)    DDD (degenerative disc disease)    Depression    Esophageal spasm    NTG and Norvasc   Essential hypertension    Fibromyalgia    Gastroparesis    GERD (gastroesophageal reflux disease)    History of cardiac catheterization    Normal coronary arteries   History of pituitary tumor    Irritable bowel syndrome    Lymphedema    Osteoarthritis    PAF (paroxysmal atrial fibrillation) (HCC)    PONV (postoperative nausea and vomiting)    Sleep apnea    BIPAP   Type 2 diabetes mellitus (HCC)    Urinary tract infection     Past  Surgical History:  Procedure Laterality Date   ABDOMINAL HYSTERECTOMY     ANTERIOR CERVICAL DECOMP/DISCECTOMY FUSION N/A 12/02/2014   Procedure: Cervical five cervical six anterior cervical decompression with fusion interbody prosthesis plating and bone graft;  Surgeon: Tressie Stalker, MD;  Location: [facility];  Service: Neurosurgery;  Laterality: N/A;  C56 anterior cervical decompression with fusion interbody prosthesis plating and bonegraft   APPENDECTOMY     BACK SURGERY     CARDIAC CATHETERIZATION     CARDIOVERSION N/A 03/26/2014   Procedure: CARDIOVERSION;  Surgeon: Laqueta Linden, MD;  Location: [facility];  Service: Endoscopy;  Laterality: N/A;   CARDIOVERSION N/A 05/04/2014   Procedure: CARDIOVERSION;  Surgeon: Jonelle Sidle,  MD;  Location: [facility];  Service: Cardiovascular;  Laterality: N/A;   CHOLECYSTECTOMY     COLONOSCOPY     Approximately 2003. Per medical records, internal hemorrhoids noted   COLONOSCOPY N/A 06/25/2013   Dr. Jena Gauss: Anal canal hemorrhoids-more likely the source of paper hematochezia. Redundant, capacious colon. Multiple colonic polyps-tubular adenoma   COLONOSCOPY WITH PROPOFOL N/A 12/04/2016   Dr. Jena Gauss: redundant colon, colonic diverticulosis. Screening due in 2023   COLONOSCOPY WITH PROPOFOL N/A 08/11/2021   Procedure: COLONOSCOPY WITH PROPOFOL;  Surgeon: Corbin Ade, MD;  Location: [facility];  Service: Endoscopy;  Laterality: N/A;  9:15am   ENDOVENOUS ABLATION SAPHENOUS VEIN W/ LASER Left 03/07/2018   endovenous ablation left greater saphenous vein by Waverly Ferrari MD    ESOPHAGOGASTRODUODENOSCOPY  10/12/2004   [id number] plaquing on the esophageal mucosa of uncertain significance, not typical of what is seen with candida esophagitis status post KOH brushing for KOH prep and biopsy for histology. Rule out candida esophagitis/eosinophilic esophagitis. Otherwise normal esophagus. Tiny hiatal hernia. Otherwise, normal stomach, normal D1 and D2.  Benign biopsy of esophagus, unknown KOH status.    ESOPHAGOGASTRODUODENOSCOPY N/A 06/25/2013   Dr. Rourk:mild chronic gastritis   KNEE SURGERY     LEFT HEART CATHETERIZATION WITH CORONARY ANGIOGRAM N/A 11/15/2010   Procedure: LEFT HEART CATHETERIZATION WITH CORONARY ANGIOGRAM;  Surgeon: Pamella Pert, MD;  Location: [facility] CATH LAB;  Service: Cardiovascular;  Laterality: N/A;   LUNG BIOPSY     POLYPECTOMY  08/11/2021   Procedure: POLYPECTOMY INTESTINAL;  Surgeon: Corbin Ade, MD;  Location: [facility];  Service: Endoscopy;;   RIGHT/LEFT HEART CATH AND CORONARY ANGIOGRAPHY N/A 08/28/2018   Procedure: RIGHT/LEFT HEART CATH AND CORONARY ANGIOGRAPHY;  Surgeon: Kathleene Hazel, MD;  Location: [facility];  Service: Cardiovascular;  Laterality: N/A;    Current Outpatient Medications  Medication Sig Dispense Refill   albuterol (PROVENTIL) (2.5 MG/3ML) 0.083% nebulizer solution Take 2.5 mg by nebulization every 6 (six) hours as needed for wheezing. For shortness of breath.     albuterol (VENTOLIN HFA) 108 (90 Base) MCG/ACT inhaler INHALE TWO PUFFS INTO THE LUNGS EVERY 6 HOURS AS NEEDED WHEEZING OR SHORTNESS OF BREATH 18 g 3   ANORO ELLIPTA 62.5-25 MCG/ACT AEPB INHALE 1 PUFF BY MOUTH EVERY DAY 60 each 5   atorvastatin (LIPITOR) 40 MG tablet Take 40 mg by mouth daily.     baclofen (LIORESAL) 20 MG tablet Take 20 mg by mouth 3 (three) times daily.     CARTIA XT 120 MG 24 hr capsule TAKE ONE CAPSULE BY MOUTH EVERY DAY 90 capsule 3   DEXILANT 60 MG capsule TAKE ONE CAPSULE BY MOUTH EVERY DAY 90 capsule 3   diclofenac sodium (VOLTAREN) 1 % GEL Apply 2-4 g topically 4 (four) times daily as needed (for pain).      docusate sodium (COLACE) 100 MG capsule Take 100 mg by mouth 2 (two) times daily.     famotidine (PEPCID) 20 MG tablet TAKE 1 TABLET BY MOUTH EVERY DAY AS NEEDED FOR HEARTBURN OR INDIGESTION 30 tablet 3   flecainide (TAMBOCOR) 50 MG tablet TAKE 2 TABLETS BY MOUTH EVERY MORNING  and TAKE 2 TABLETS BY MOUTH EVERY EVENING 120 tablet 3   fluticasone (FLONASE) 50 MCG/ACT nasal spray INSTILL 2 SPRAYS IN EACH NOSTRIL EVERY DAY 16 g 6   furosemide (LASIX) 80 MG tablet Take 80 mg by mouth 2 (two) times daily.     gabapentin (NEURONTIN) 800 MG  tablet Take 800 mg by mouth 4 (four) times daily.   2   glimepiride (AMARYL) 4 MG tablet Take 4 mg by mouth 2 (two) times daily.     hydrocortisone (ANUSOL-HC) 2.5 % rectal cream Place 1 application rectally 2 (two) times daily. (Patient taking differently: Place 1 application  rectally 2 (two) times daily as needed for hemorrhoids.) 30 g 1   isosorbide mononitrate (IMDUR) 120 MG 24 hr tablet Take 120 mg by mouth daily. In the morning     isosorbide mononitrate (IMDUR) 30 MG 24 hr tablet Take 30 mg by mouth every evening.     linaclotide (LINZESS) 290 MCG CAPS capsule TAKE ONE CAPSULE BY MOUTH EVERY DAY BEFORE BREAKFAST 90 capsule 0   lisinopril (PRINIVIL,ZESTRIL) 10 MG tablet Take 10 mg by mouth every evening.      loratadine (CLARITIN) 10 MG tablet Take 1 tablet (10 mg total) by mouth daily. 30 tablet 11   meclizine (ANTIVERT) 25 MG tablet Take 25 mg by mouth 3 (three) times daily.     metFORMIN (GLUCOPHAGE) 500 MG tablet Take 1,000 mg by mouth 2 (two) times daily. Take 1,000mg  in the Morning and 500 mg in the evening     metolazone (ZAROXOLYN) 2.5 MG tablet Take 5 mg by mouth daily with breakfast.     MOTEGRITY 2 MG TABS TAKE 1 TABLET BY MOUTH DAILY 30 tablet 5   Multiple Vitamin (MULTIVITAMIN WITH MINERALS) TABS tablet Take 1 tablet by mouth daily.     mupirocin ointment (BACTROBAN) 2 % Apply 1 Application topically in the morning and at bedtime.     nitroGLYCERIN (NITROLINGUAL) 0.4 MG/SPRAY spray USE 1 SPRAY UNDER THE TONGUE EVERY 5 MINUTES UP TO 3 DOSES AS NEEDED FOR CHEST PAIN. GO TO ER IF NOT RESOLVED 12 g 3   NUCYNTA ER 100 MG 12 hr tablet Take 100 mg by mouth 2 (two) times daily.     nystatin (MYCOSTATIN/NYSTOP) 100000 UNIT/GM POWD  Apply 1 g topically daily.     ondansetron (ZOFRAN) 4 MG tablet TAKE 1 TABLET BY MOUTH EVERY 8 HOURS AS NEEDED FOR NAUSEA AND VOMITING 30 tablet 3   ondansetron (ZOFRAN-ODT) 8 MG disintegrating tablet Take 8 mg by mouth every 8 (eight) hours as needed for refractory nausea / vomiting.     OTEZLA 30 MG TABS Take 1 tablet by mouth 2 (two) times daily.     oxyCODONE-acetaminophen (PERCOCET) 10-325 MG per tablet Take 1 tablet by mouth every 4 (four) hours as needed for pain.     OXYGEN Inhale 2 L into the lungs as needed (shortness of breath).     OZEMPIC, 1 MG/DOSE, 4 MG/3ML SOPN Inject 1 mg into the skin every Tuesday.     polyethylene glycol-electrolytes (NULYTELY) 420 g solution As directed 4000 mL 0   potassium chloride SA (K-DUR,KLOR-CON) 20 MEQ tablet Take 40 mEq by mouth 5 (five) times daily.     rivaroxaban (XARELTO) 20 MG TABS tablet Take 20 mg by mouth daily with supper.      sodium chloride (OCEAN) 0.65 % SOLN nasal spray Place 1 spray into both nostrils as needed for congestion.     spironolactone (ALDACTONE) 50 MG tablet Take 50 mg by mouth 3 (three) times daily.     TOUJEO SOLOSTAR 300 UNIT/ML Solostar Pen Inject 46 Units into the skin at bedtime.     VASCEPA 1 g CAPS Take 1 g by mouth 4 (four) times daily.     Semaglutide,0.25  or 0.5MG /DOS, 2 MG/1.5ML SOPN Inject 0.25 mg into the skin every Tuesday. At lunch     No current facility-administered medications for this visit.    Allergies as of 11/14/2022 - Review Complete 11/14/2022  Allergen Reaction Noted   Tape Other (See Comments) 11/21/2016   Hyoscyamine sulfate Palpitations    Metoclopramide Other (See Comments) 08/17/2011    Family History  Problem Relation Age of Onset   Coronary artery disease Father        Died age 50   Heart attack Father    Arrhythmia Father        AF   Diabetes Father    Parkinson's disease Father    Arrhythmia Mother        AF   Stroke Mother    Dementia Mother    Cancer Mother         UTERINE    Parkinson's disease Mother    Arrhythmia Brother        AF   Colon cancer Maternal Grandfather    Colon cancer Paternal Aunt    Colon cancer Paternal Aunt    Diabetes Son    Cancer Sister        BREAST    Depression Sister    Breast cancer Sister    Depression Sister    Depression Sister    Breast cancer Maternal Aunt     Social History   Socioeconomic History   Marital status: Divorced    Spouse name: Not on file   Number of children: Not on file   Years of education: Not on file   Highest education level: Not on file  Occupational History   Not on file  Tobacco Use   Smoking status: Former    Current packs/day: 0.00    Average packs/day: 1.5 packs/day for 30.0 years (45.0 ttl pk-yrs)    Types: Cigarettes    Start date: 03/06/1977    Quit date: 01/03/2007    Years since quitting: 15.8   Smokeless tobacco: Never  Vaping Use   Vaping status: Never Used  Substance and Sexual Activity   Alcohol use: No    Alcohol/week: 0.0 standard drinks of alcohol   Drug use: No   Sexual activity: Not Currently  Other Topics Concern   Not on file  Social History Narrative   Not on file   Social Determinants of Health   Financial Resource Strain: Low Risk  (09/22/2022)   Overall Financial Resource Strain (CARDIA)    Difficulty of Paying Living Expenses: Not very hard  Food Insecurity: No Food Insecurity (08/18/2022)   Hunger Vital Sign    Worried About Running Out of Food in the Last Year: Never true    Ran Out of Food in the Last Year: Never true  Transportation Needs: No Transportation Needs (09/22/2022)   PRAPARE - Administrator, Civil Service (Medical): No    Lack of Transportation (Non-Medical): No  Physical Activity: Inactive (08/18/2022)   Exercise Vital Sign    Days of Exercise per Week: 0 days    Minutes of Exercise per Session: 0 min  Stress: Not on file  Social Connections: Unknown (02/01/2022)   Received from [facility], [facility]    Social Network    Social Network: Not on file  Intimate Partner Violence: Unknown (02/01/2022)   Received from [facility], [facility]   HITS    Physically Hurt: Not on file    Insult or Talk Down To:  Not on file    Threaten Physical Harm: Not on file    Scream or Curse: Not on file     Review of Systems   Gen: Denies any fever, chills, fatigue, weight loss, lack of appetite.  CV: Denies chest pain, heart palpitations, peripheral edema, syncope.  Resp: Denies shortness of breath at rest or with exertion. Denies wheezing or cough.  GI: Denies dysphagia or odynophagia. Denies jaundice, hematemesis, fecal incontinence. GU : Denies urinary burning, urinary frequency, urinary hesitancy MS: Denies joint pain, muscle weakness, cramps, or limitation of movement.  Derm: Denies rash, itching, dry skin Psych: Denies depression, anxiety, memory loss, and confusion Heme: Denies bruising, bleeding, and enlarged lymph nodes.   Physical Exam   BP 108/65   Pulse 66   Temp 98.2 F (36.8 C)   Ht 5\' 8"  (1.727 m)   Wt (!) 328 lb (148.8 kg)   BMI 49.87 kg/m  General:   Alert and oriented. Pleasant and cooperative. Well-nourished and well-developed.  Head:  Normocephalic and atraumatic. Eyes:  Without icterus Abdomen:  +BS, soft, non-tender and non-distended. No HSM noted. No guarding or rebound. No masses appreciated.  Rectal:  Deferred  Msk:  Symmetrical without gross deformities. Normal posture. Extremities:  Without edema. Neurologic:  Alert and  oriented x4;  grossly normal neurologically. Skin:  Intact without significant lesions or rashes. Psych:  Alert and cooperative. Normal mood and affect.   Assessment   [name] [name] is a 62 y.o. female presenting today with a history of  OIC, GERD, gastroparesis, colon polyps,  for routine follow-up.   Constipation managed after much trial/error with combination of Motegrity, Linzess, and OTC agents. May titrate Miralax prn.   GERD  controlled on Dexilant daily. Gastroparesis symptoms quiescent now.   Rectal bleeding in setting of hemorrhoids. Colonoscopy on file as of Aug 2023 with next due in 2028.      PLAN    Anusol BID per rectum for 5-7 days Refilled Motegrity, Linzess. Titrate Miralax in evenings so as to avoid looser stool Return in 1 year Call if wants to pursue banding: benefits may outweigh risks if persistent bleeding   Gelene Mink, PhD, ANP-BC [facility] Gastroenter[facility]

## 2022-11-14 NOTE — Patient Instructions (Signed)
I have refilled Dexilant, Motegrity, and Linzess.   I have sent in Anusol cream to use twice a day per rectum for 5-7 days with bleeding hemorrhoids. Avoid straining, and limit toilet time to 2-3 minutes. You can titrate the Miralax as needed (possibly try in evening so you know how the day has gone).  We will see you in 1 year or sooner if needed! Please call if persistent bleeding!  I enjoyed seeing you again today! I value our relationship and want to provide genuine, compassionate, and quality care. You may receive a survey regarding your visit with me, and I welcome your feedback! Thanks so much for taking the time to complete this. I look forward to seeing you again.      Gelene Mink, PhD, ANP-BC [facility] Gastroenterology

## 2022-11-21 DIAGNOSIS — M170 Bilateral primary osteoarthritis of knee: Secondary | ICD-10-CM | POA: Diagnosis not present

## 2022-11-21 DIAGNOSIS — M8580 Other specified disorders of bone density and structure, unspecified site: Secondary | ICD-10-CM | POA: Diagnosis not present

## 2022-11-21 DIAGNOSIS — L4050 Arthropathic psoriasis, unspecified: Secondary | ICD-10-CM | POA: Diagnosis not present

## 2022-11-21 DIAGNOSIS — M5136 Other intervertebral disc degeneration, lumbar region with discogenic back pain only: Secondary | ICD-10-CM | POA: Diagnosis not present

## 2022-11-21 DIAGNOSIS — R768 Other specified abnormal immunological findings in serum: Secondary | ICD-10-CM | POA: Diagnosis not present

## 2022-11-21 DIAGNOSIS — R110 Nausea: Secondary | ICD-10-CM | POA: Diagnosis not present

## 2022-11-21 DIAGNOSIS — L409 Psoriasis, unspecified: Secondary | ICD-10-CM | POA: Diagnosis not present

## 2022-11-21 DIAGNOSIS — L03116 Cellulitis of left lower limb: Secondary | ICD-10-CM | POA: Diagnosis not present

## 2022-11-28 ENCOUNTER — Other Ambulatory Visit: Payer: Self-pay | Admitting: Gastroenterology

## 2022-12-01 DIAGNOSIS — E1165 Type 2 diabetes mellitus with hyperglycemia: Secondary | ICD-10-CM | POA: Diagnosis not present

## 2022-12-05 DIAGNOSIS — M48061 Spinal stenosis, lumbar region without neurogenic claudication: Secondary | ICD-10-CM | POA: Diagnosis not present

## 2022-12-05 DIAGNOSIS — E1142 Type 2 diabetes mellitus with diabetic polyneuropathy: Secondary | ICD-10-CM | POA: Diagnosis not present

## 2022-12-05 DIAGNOSIS — G894 Chronic pain syndrome: Secondary | ICD-10-CM | POA: Diagnosis not present

## 2022-12-05 DIAGNOSIS — M47816 Spondylosis without myelopathy or radiculopathy, lumbar region: Secondary | ICD-10-CM | POA: Diagnosis not present

## 2022-12-15 DIAGNOSIS — R339 Retention of urine, unspecified: Secondary | ICD-10-CM | POA: Diagnosis not present

## 2023-01-01 DIAGNOSIS — E1165 Type 2 diabetes mellitus with hyperglycemia: Secondary | ICD-10-CM | POA: Diagnosis not present

## 2023-01-02 DIAGNOSIS — L84 Corns and callosities: Secondary | ICD-10-CM | POA: Diagnosis not present

## 2023-01-02 DIAGNOSIS — M79676 Pain in unspecified toe(s): Secondary | ICD-10-CM | POA: Diagnosis not present

## 2023-01-02 DIAGNOSIS — B351 Tinea unguium: Secondary | ICD-10-CM | POA: Diagnosis not present

## 2023-01-02 DIAGNOSIS — E1142 Type 2 diabetes mellitus with diabetic polyneuropathy: Secondary | ICD-10-CM | POA: Diagnosis not present

## 2023-01-04 DIAGNOSIS — J449 Chronic obstructive pulmonary disease, unspecified: Secondary | ICD-10-CM | POA: Diagnosis not present

## 2023-01-04 DIAGNOSIS — G4733 Obstructive sleep apnea (adult) (pediatric): Secondary | ICD-10-CM | POA: Diagnosis not present

## 2023-01-23 ENCOUNTER — Ambulatory Visit: Payer: 59 | Admitting: Adult Health

## 2023-01-31 DIAGNOSIS — E1165 Type 2 diabetes mellitus with hyperglycemia: Secondary | ICD-10-CM | POA: Diagnosis not present

## 2023-02-06 DIAGNOSIS — E1142 Type 2 diabetes mellitus with diabetic polyneuropathy: Secondary | ICD-10-CM | POA: Diagnosis not present

## 2023-02-06 DIAGNOSIS — G894 Chronic pain syndrome: Secondary | ICD-10-CM | POA: Diagnosis not present

## 2023-02-06 DIAGNOSIS — M48061 Spinal stenosis, lumbar region without neurogenic claudication: Secondary | ICD-10-CM | POA: Diagnosis not present

## 2023-02-06 DIAGNOSIS — M47816 Spondylosis without myelopathy or radiculopathy, lumbar region: Secondary | ICD-10-CM | POA: Diagnosis not present

## 2023-02-12 ENCOUNTER — Other Ambulatory Visit: Payer: Self-pay | Admitting: Pulmonary Disease

## 2023-02-20 DIAGNOSIS — M069 Rheumatoid arthritis, unspecified: Secondary | ICD-10-CM | POA: Diagnosis not present

## 2023-02-20 DIAGNOSIS — I10 Essential (primary) hypertension: Secondary | ICD-10-CM | POA: Diagnosis not present

## 2023-02-20 DIAGNOSIS — I5032 Chronic diastolic (congestive) heart failure: Secondary | ICD-10-CM | POA: Diagnosis not present

## 2023-02-20 DIAGNOSIS — E1159 Type 2 diabetes mellitus with other circulatory complications: Secondary | ICD-10-CM | POA: Diagnosis not present

## 2023-02-20 DIAGNOSIS — I152 Hypertension secondary to endocrine disorders: Secondary | ICD-10-CM | POA: Diagnosis not present

## 2023-02-20 DIAGNOSIS — J449 Chronic obstructive pulmonary disease, unspecified: Secondary | ICD-10-CM | POA: Diagnosis not present

## 2023-02-20 DIAGNOSIS — Z299 Encounter for prophylactic measures, unspecified: Secondary | ICD-10-CM | POA: Diagnosis not present

## 2023-03-02 DIAGNOSIS — E1165 Type 2 diabetes mellitus with hyperglycemia: Secondary | ICD-10-CM | POA: Diagnosis not present

## 2023-03-03 DIAGNOSIS — J449 Chronic obstructive pulmonary disease, unspecified: Secondary | ICD-10-CM | POA: Diagnosis not present

## 2023-03-03 DIAGNOSIS — G4733 Obstructive sleep apnea (adult) (pediatric): Secondary | ICD-10-CM | POA: Diagnosis not present

## 2023-03-06 DIAGNOSIS — E1142 Type 2 diabetes mellitus with diabetic polyneuropathy: Secondary | ICD-10-CM | POA: Diagnosis not present

## 2023-03-06 DIAGNOSIS — B351 Tinea unguium: Secondary | ICD-10-CM | POA: Diagnosis not present

## 2023-03-06 DIAGNOSIS — M79676 Pain in unspecified toe(s): Secondary | ICD-10-CM | POA: Diagnosis not present

## 2023-03-06 DIAGNOSIS — L84 Corns and callosities: Secondary | ICD-10-CM | POA: Diagnosis not present

## 2023-03-08 DIAGNOSIS — R339 Retention of urine, unspecified: Secondary | ICD-10-CM | POA: Diagnosis not present

## 2023-03-13 ENCOUNTER — Other Ambulatory Visit: Payer: Self-pay | Admitting: Cardiology

## 2023-03-20 ENCOUNTER — Ambulatory Visit: Payer: 59 | Admitting: Primary Care

## 2023-03-20 ENCOUNTER — Encounter: Payer: Self-pay | Admitting: Primary Care

## 2023-03-20 ENCOUNTER — Ambulatory Visit

## 2023-03-20 VITALS — BP 108/78 | HR 60 | Ht 68.0 in | Wt 329.4 lb

## 2023-03-20 DIAGNOSIS — L03116 Cellulitis of left lower limb: Secondary | ICD-10-CM | POA: Diagnosis not present

## 2023-03-20 DIAGNOSIS — M8580 Other specified disorders of bone density and structure, unspecified site: Secondary | ICD-10-CM | POA: Diagnosis not present

## 2023-03-20 DIAGNOSIS — M170 Bilateral primary osteoarthritis of knee: Secondary | ICD-10-CM | POA: Diagnosis not present

## 2023-03-20 DIAGNOSIS — J410 Simple chronic bronchitis: Secondary | ICD-10-CM

## 2023-03-20 DIAGNOSIS — Z87891 Personal history of nicotine dependence: Secondary | ICD-10-CM | POA: Diagnosis not present

## 2023-03-20 DIAGNOSIS — R110 Nausea: Secondary | ICD-10-CM | POA: Diagnosis not present

## 2023-03-20 DIAGNOSIS — L4050 Arthropathic psoriasis, unspecified: Secondary | ICD-10-CM | POA: Diagnosis not present

## 2023-03-20 DIAGNOSIS — G4733 Obstructive sleep apnea (adult) (pediatric): Secondary | ICD-10-CM | POA: Diagnosis not present

## 2023-03-20 DIAGNOSIS — M5136 Other intervertebral disc degeneration, lumbar region with discogenic back pain only: Secondary | ICD-10-CM | POA: Diagnosis not present

## 2023-03-20 DIAGNOSIS — J449 Chronic obstructive pulmonary disease, unspecified: Secondary | ICD-10-CM | POA: Diagnosis not present

## 2023-03-20 DIAGNOSIS — R768 Other specified abnormal immunological findings in serum: Secondary | ICD-10-CM | POA: Diagnosis not present

## 2023-03-20 DIAGNOSIS — L409 Psoriasis, unspecified: Secondary | ICD-10-CM | POA: Diagnosis not present

## 2023-03-20 MED ORDER — ANORO ELLIPTA 62.5-25 MCG/ACT IN AEPB
1.0000 | INHALATION_SPRAY | Freq: Every day | RESPIRATORY_TRACT | 11 refills | Status: AC
Start: 2023-03-20 — End: ?

## 2023-03-20 NOTE — Patient Instructions (Signed)
 -  CHRONIC OBSTRUCTIVE PULMONARY DISEASE (COPD): COPD is a chronic lung condition that makes it hard to breathe. We will refill your Anoro prescription and order a chest x-ray to check your current lung status.  -LUNG NODULE: A lung nodule is a small growth in the lung. Given your smoking history, we will order a chest x-ray to monitor it.  -OBSTRUCTIVE SLEEP APNEA: Obstructive sleep apnea is a condition where your breathing stops and starts during sleep. Your condition is well-controlled with BiPAP, and we will renew your BiPAP supplies.  -ATRIAL FIBRILLATION: Atrial fibrillation is an irregular and often rapid heart rate. To help reduce the swelling in your legs, please try to elevate them as much as possible.  -LYMPHEDEMA: Lymphedema is swelling generally in the arms or legs due to a lymphatic system blockage. Use your compression devices once you return home to help manage the swelling.  Follow-up 1 year FU with Waynetta Sandy NP

## 2023-03-20 NOTE — Progress Notes (Signed)
 @Patient  ID: [name] [name], female    DOB: 23-Apr-1960, 63 y.o.   MRN: [id number]  Chief Complaint  Patient presents with   Follow-up    Sob at rest.    Referring provider: Ignatius Specking, MD  HPI: 63 year old female, former smoker quit in 2009.  Past medical history significant for COPD, OSA, arthritis, morbid obesity, A-fib on Xarelto, bilateral lower extremity lymphedema, rheumatoid arthritis on Humira.   03/20/2023 Discussed the use of AI scribe software for clinical note transcription with the patient, who gave verbal consent to proceed.  [name] [name] is a 63 year old female with sleep apnea and COPD who presents for an overdue follow-up visit.  She has COPD and is currently taking Anoro daily. She is a former smoker, having quit approximately 15 years ago. She experiences increased shortness of breath over the last couple of days, which she attributes to seasonal changes. She describes a burning sensation in her chest, similar to the onset of bronchitis. No cough, mucus production, or postnasal drip. She uses Flonase and loratadine for her allergies, which are well-controlled.  She is compliant with her BiPAP therapy, using it every night for five hours with a pressure setting of fourteen over nine. Her apnea score is 3.6, indicating well-controlled sleep apnea. She is overdue for her annual checkup, which is necessary to continue her medications and renew her BiPAP supplies.  She has a history of atrial fibrillation and is on Lasix, spironolactone, and another diuretic. She reports increased swelling, which she attributes to staying in a motel while her apartment is being remodeled. She is unable to elevate her legs adequately in the motel, which she believes contributes to the swelling. She has a history of lymphedema and plans to use compression devices once she returns home. The swelling is likely exacerbated by her current living conditions in a motel.   She recalls a past  lung nodule that was monitored by a previous doctor, Dr. Egbert Garibaldi, in [place]. She underwent a bronchoscopy and other evaluations, but the nodule did not change in size over time. She had a CT abdomen/chest scan in 2010, which showed clear lungs, and she has not had any recent imaging to follow up on this.      Airview download 02/16/2018 5-3 616/25 Usage days 30/30 days (100%); 24 days (80%) greater than 4 hours Average usage days used 5 hours 5 minutes Pressure IPAP 14 cm H2O/EPAP 9 cm H2O Air leaks median 3.9 L/min; 95th percentile 41.4 L/min AHI 3.6  Allergies  Allergen Reactions   Tape Other (See Comments)    Welts, bad place on skin. Paper tape is okay   Hyoscyamine Sulfate Palpitations   Metoclopramide Other (See Comments)    tremors    Immunization History  Administered Date(s) Administered   Influenza Split 10/18/2005   Influenza,inj,Quad PF,6+ Mos 11/06/2013, 10/30/2016, 11/25/2019   Influenza,inj,quad, With Preservative 11/26/2017   Moderna Sars-Covid-2 Vaccination 03/19/2019, 04/14/2019   Tetanus 02/26/2003    Past Medical History:  Diagnosis Date   Bladder dysfunction    Self urinary catheterization   Complication of anesthesia    COPD (chronic obstructive pulmonary disease) (HCC)    DDD (degenerative disc disease)    Depression    Esophageal spasm    NTG and Norvasc   Essential hypertension    Fibromyalgia    Gastroparesis    GERD (gastroesophageal reflux disease)    History of cardiac catheterization    Normal coronary arteries   History  of pituitary tumor    Irritable bowel syndrome    Lymphedema    Osteoarthritis    PAF (paroxysmal atrial fibrillation) (HCC)    PONV (postoperative nausea and vomiting)    Sleep apnea    BIPAP   Type 2 diabetes mellitus (HCC)    Urinary tract infection     Tobacco History: Social History   Tobacco Use  Smoking Status Former   Current packs/day: 0.00   Average packs/day: 1.5 packs/day for 30.0 years (45.0 ttl  pk-yrs)   Types: Cigarettes   Start date: 03/06/1977   Quit date: 01/03/2007   Years since quitting: 16.2  Smokeless Tobacco Never   Counseling given: Not Answered   Outpatient Medications Prior to Visit  Medication Sig Dispense Refill   albuterol (PROVENTIL) (2.5 MG/3ML) 0.083% nebulizer solution Take 2.5 mg by nebulization every 6 (six) hours as needed for wheezing. For shortness of breath.     albuterol (VENTOLIN HFA) 108 (90 Base) MCG/ACT inhaler INHALE TWO PUFFS INTO THE LUNGS EVERY 6 HOURS AS NEEDED WHEEZING OR SHORTNESS OF BREATH 18 g 3   atorvastatin (LIPITOR) 40 MG tablet Take 40 mg by mouth daily.     baclofen (LIORESAL) 20 MG tablet Take 20 mg by mouth 3 (three) times daily.     CARTIA XT 120 MG 24 hr capsule TAKE ONE CAPSULE BY MOUTH EVERY DAY 90 capsule 3   DEXILANT 60 MG capsule TAKE ONE CAPSULE BY MOUTH EVERY DAY 90 capsule 3   dexlansoprazole (DEXILANT) 60 MG capsule Take 1 capsule (60 mg total) by mouth daily. 90 capsule 3   diclofenac sodium (VOLTAREN) 1 % GEL Apply 2-4 g topically 4 (four) times daily as needed (for pain).      docusate sodium (COLACE) 100 MG capsule Take 100 mg by mouth 2 (two) times daily.     famotidine (PEPCID) 20 MG tablet TAKE 1 TABLET BY MOUTH EVERY DAY AS NEEDED FOR HEARTBURN OR INDIGESTION 30 tablet 3   flecainide (TAMBOCOR) 50 MG tablet TAKE 2 TABLETS BY MOUTH EVERY MORNING and TAKE 2 TABLETS BY MOUTH EVERY EVENING 120 tablet 2   fluticasone (FLONASE) 50 MCG/ACT nasal spray INSTILL 2 SPRAYS IN EACH NOSTRIL EVERY DAY 16 g 6   furosemide (LASIX) 80 MG tablet Take 80 mg by mouth 2 (two) times daily.     gabapentin (NEURONTIN) 800 MG tablet Take 800 mg by mouth 4 (four) times daily.   2   glimepiride (AMARYL) 4 MG tablet Take 4 mg by mouth 2 (two) times daily.     hydrocortisone (ANUSOL-HC) 2.5 % rectal cream Place 1 application rectally 2 (two) times daily. (Patient taking differently: Place 1 application  rectally 2 (two) times daily as needed for  hemorrhoids.) 30 g 1   hydrocortisone (ANUSOL-HC) 2.5 % rectal cream Place 1 Application rectally 2 (two) times daily. For rectal bleeding 30 g 1   isosorbide mononitrate (IMDUR) 120 MG 24 hr tablet Take 120 mg by mouth daily. In the morning     isosorbide mononitrate (IMDUR) 30 MG 24 hr tablet Take 30 mg by mouth every evening.     linaclotide (LINZESS) 290 MCG CAPS capsule Take 1 capsule (290 mcg total) by mouth daily before breakfast. 90 capsule 3   lisinopril (PRINIVIL,ZESTRIL) 10 MG tablet Take 10 mg by mouth every evening.      loratadine (CLARITIN) 10 MG tablet Take 1 tablet (10 mg total) by mouth daily. 30 tablet 11   meclizine (ANTIVERT) 25  MG tablet Take 25 mg by mouth 3 (three) times daily.     metFORMIN (GLUCOPHAGE) 500 MG tablet Take 1,000 mg by mouth 2 (two) times daily. Take 1,000mg  in the Morning and 500 mg in the evening     metolazone (ZAROXOLYN) 2.5 MG tablet Take 5 mg by mouth daily with breakfast.     Multiple Vitamin (MULTIVITAMIN WITH MINERALS) TABS tablet Take 1 tablet by mouth daily.     mupirocin ointment (BACTROBAN) 2 % Apply 1 Application topically in the morning and at bedtime.     nitroGLYCERIN (NITROLINGUAL) 0.4 MG/SPRAY spray USE 1 SPRAY UNDER THE TONGUE EVERY 5 MINUTES UP TO 3 DOSES AS NEEDED FOR CHEST PAIN. GO TO ER IF NOT RESOLVED 12 g 3   NUCYNTA ER 100 MG 12 hr tablet Take 100 mg by mouth 2 (two) times daily.     nystatin (MYCOSTATIN/NYSTOP) 100000 UNIT/GM POWD Apply 1 g topically daily.     ondansetron (ZOFRAN) 4 MG tablet TAKE 1 TABLET BY MOUTH EVERY 8 HOURS AS NEEDED FOR NAUSEA AND VOMITING 30 tablet 3   ondansetron (ZOFRAN-ODT) 8 MG disintegrating tablet Take 8 mg by mouth every 8 (eight) hours as needed for refractory nausea / vomiting.     OTEZLA 30 MG TABS Take 1 tablet by mouth 2 (two) times daily.     oxyCODONE-acetaminophen (PERCOCET) 10-325 MG per tablet Take 1 tablet by mouth every 4 (four) hours as needed for pain.     OXYGEN Inhale 2 L into the  lungs as needed (shortness of breath).     OZEMPIC, 1 MG/DOSE, 4 MG/3ML SOPN Inject 1 mg into the skin every Tuesday.     potassium chloride SA (K-DUR,KLOR-CON) 20 MEQ tablet Take 40 mEq by mouth 5 (five) times daily.     Prucalopride Succinate (MOTEGRITY) 2 MG TABS Take 1 tablet (2 mg total) by mouth daily. 90 tablet 3   rivaroxaban (XARELTO) 20 MG TABS tablet Take 20 mg by mouth daily with supper.      sodium chloride (OCEAN) 0.65 % SOLN nasal spray Place 1 spray into both nostrils as needed for congestion.     spironolactone (ALDACTONE) 50 MG tablet Take 50 mg by mouth 3 (three) times daily.     TOUJEO SOLOSTAR 300 UNIT/ML Solostar Pen Inject 46 Units into the skin at bedtime.     umeclidinium-vilanterol (ANORO ELLIPTA) 62.5-25 MCG/ACT AEPB INHALE 1 PUFF BY MOUTH EVERY DAY 60 each 1   VASCEPA 1 g CAPS Take 1 g by mouth 4 (four) times daily.     No facility-administered medications prior to visit.    Review of Systems  Review of Systems  Constitutional: Negative.   HENT: Negative.    Respiratory:  Positive for chest tightness and shortness of breath. Negative for cough.   Cardiovascular:  Positive for leg swelling.     Physical Exam  Ht 5\' 8"  (1.727 m)   Wt (!) 329 lb 6.4 oz (149.4 kg)   BMI 50.09 kg/m  Physical Exam Constitutional:      Appearance: Normal appearance. She is obese.  HENT:     Head: Normocephalic and atraumatic.  Cardiovascular:     Rate and Rhythm: Normal rate and regular rhythm.     Heart sounds: Murmur heard.  Pulmonary:     Effort: Pulmonary effort is normal.     Breath sounds: Normal breath sounds.     Comments: CTA; No overt wheezing or rhonchi  Musculoskeletal:  General: Normal range of motion.     Right lower leg: Edema present.     Left lower leg: Edema present.  Skin:    General: Skin is warm and dry.  Neurological:     General: No focal deficit present.     Mental Status: She is alert and oriented to person, place, and time. Mental  status is at baseline.  Psychiatric:        Mood and Affect: Mood normal.        Behavior: Behavior normal.        Thought Content: Thought content normal.        Judgment: Judgment normal.      Lab Results:  CBC    Component Value Date/Time   WBC 13.1 (H) 08/16/2018 1837   RBC 4.33 08/16/2018 1837   HGB 11.6 (L) 08/11/2021 1140   HGB 12.8 05/28/2013 0000   HCT 34.0 (L) 08/11/2021 1140   HCT 39 05/28/2013 0000   PLT 380 08/16/2018 1837   MCV 86.1 08/16/2018 1837   MCH 27.0 08/16/2018 1837   MCHC 31.4 08/16/2018 1837   RDW 14.1 08/16/2018 1837   LYMPHSABS 2,952 10/24/2017 1611   MONOABS 0.7 09/15/2015 0200   EOSABS 228 10/24/2017 1611   BASOSABS 84 10/24/2017 1611    BMET    Component Value Date/Time   NA 137 08/11/2021 1140   K 4.0 08/11/2021 1140   CL 103 08/11/2021 1140   CO2 27 08/16/2018 1837   GLUCOSE 119 (H) 08/11/2021 1140   BUN 8 08/11/2021 1140   CREATININE 0.60 08/11/2021 1140   CREATININE 0.55 11/01/2016 1607   CALCIUM 9.7 08/16/2018 1837   GFRNONAA >60 08/16/2018 1837   GFRNONAA 106 11/01/2016 1607   GFRAA >60 08/16/2018 1837   GFRAA 122 11/01/2016 1607    BNP    Component Value Date/Time   BNP 25.0 08/16/2018 1839    ProBNP No results found for: "PROBNP"  Imaging: No results found.   Assessment & Plan:   1. Simple chronic bronchitis (HCC) (Primary) - DG Chest 2 View; Future  2. Obstructive sleep apnea - Ambulatory Referral for DME     Chronic Obstructive Pulmonary Disease (COPD) Increased dyspnea possibly due to seasonal changes. No acute bronchitis symptoms. Former smoker. Follow-up chest x-ray warranted. - Refill Anoro prescription  - Order chest x-ray to evaluate current status of COPD.  Lung Nodule Lung nodule previously monitored. She had a CT abdomen/chest scan in 2010, which showed clear lungs. Follow-up chest x-ray warranted due to smoking history. - Order chest x-ray to evaluate for lung nodule.  Obstructive Sleep  Apnea Well-controlled with BiPAP 14/9cm h20; residual AHI 3.6/hour. No changes needed. Advised patient continue to wear BIPAP nightly 4-6 hours  - Renew BiPAP supplies.  GERD -Continue Dexilant as directed   Afib - Regular rate and rhythm on exam - Continue Flecainide as directed   Lymphedema Increased swelling due to not using compression devices. - Advise to use compression devices once she returns home.  - Continue Lasix and Zaroxolyn as directed      Glenford Bayley, NP 03/20/2023

## 2023-04-01 DIAGNOSIS — E1165 Type 2 diabetes mellitus with hyperglycemia: Secondary | ICD-10-CM | POA: Diagnosis not present

## 2023-04-04 DIAGNOSIS — J449 Chronic obstructive pulmonary disease, unspecified: Secondary | ICD-10-CM | POA: Diagnosis not present

## 2023-04-04 DIAGNOSIS — R600 Localized edema: Secondary | ICD-10-CM | POA: Diagnosis not present

## 2023-04-04 DIAGNOSIS — Z299 Encounter for prophylactic measures, unspecified: Secondary | ICD-10-CM | POA: Diagnosis not present

## 2023-04-04 DIAGNOSIS — Z713 Dietary counseling and surveillance: Secondary | ICD-10-CM | POA: Diagnosis not present

## 2023-04-04 DIAGNOSIS — L03116 Cellulitis of left lower limb: Secondary | ICD-10-CM | POA: Diagnosis not present

## 2023-04-04 DIAGNOSIS — G4733 Obstructive sleep apnea (adult) (pediatric): Secondary | ICD-10-CM | POA: Diagnosis not present

## 2023-04-04 DIAGNOSIS — L97509 Non-pressure chronic ulcer of other part of unspecified foot with unspecified severity: Secondary | ICD-10-CM | POA: Diagnosis not present

## 2023-04-04 NOTE — Progress Notes (Signed)
 Please let patient know CXR showed clear lungs, no acute process

## 2023-04-05 DIAGNOSIS — R339 Retention of urine, unspecified: Secondary | ICD-10-CM | POA: Diagnosis not present

## 2023-04-05 DIAGNOSIS — H43393 Other vitreous opacities, bilateral: Secondary | ICD-10-CM | POA: Diagnosis not present

## 2023-04-12 ENCOUNTER — Other Ambulatory Visit: Payer: Self-pay | Admitting: Pulmonary Disease

## 2023-04-19 DIAGNOSIS — M47816 Spondylosis without myelopathy or radiculopathy, lumbar region: Secondary | ICD-10-CM | POA: Diagnosis not present

## 2023-04-19 DIAGNOSIS — Z79891 Long term (current) use of opiate analgesic: Secondary | ICD-10-CM | POA: Diagnosis not present

## 2023-04-19 DIAGNOSIS — M48061 Spinal stenosis, lumbar region without neurogenic claudication: Secondary | ICD-10-CM | POA: Diagnosis not present

## 2023-04-19 DIAGNOSIS — G894 Chronic pain syndrome: Secondary | ICD-10-CM | POA: Diagnosis not present

## 2023-04-19 DIAGNOSIS — E1142 Type 2 diabetes mellitus with diabetic polyneuropathy: Secondary | ICD-10-CM | POA: Diagnosis not present

## 2023-04-24 DIAGNOSIS — G894 Chronic pain syndrome: Secondary | ICD-10-CM | POA: Diagnosis not present

## 2023-04-24 DIAGNOSIS — M069 Rheumatoid arthritis, unspecified: Secondary | ICD-10-CM | POA: Diagnosis not present

## 2023-04-24 DIAGNOSIS — Z299 Encounter for prophylactic measures, unspecified: Secondary | ICD-10-CM | POA: Diagnosis not present

## 2023-04-24 DIAGNOSIS — J449 Chronic obstructive pulmonary disease, unspecified: Secondary | ICD-10-CM | POA: Diagnosis not present

## 2023-04-24 DIAGNOSIS — Z7189 Other specified counseling: Secondary | ICD-10-CM | POA: Diagnosis not present

## 2023-04-24 DIAGNOSIS — I10 Essential (primary) hypertension: Secondary | ICD-10-CM | POA: Diagnosis not present

## 2023-04-24 DIAGNOSIS — Z79891 Long term (current) use of opiate analgesic: Secondary | ICD-10-CM | POA: Diagnosis not present

## 2023-04-24 DIAGNOSIS — I5032 Chronic diastolic (congestive) heart failure: Secondary | ICD-10-CM | POA: Diagnosis not present

## 2023-04-24 DIAGNOSIS — Z713 Dietary counseling and surveillance: Secondary | ICD-10-CM | POA: Diagnosis not present

## 2023-04-24 DIAGNOSIS — Z0000 Encounter for general adult medical examination without abnormal findings: Secondary | ICD-10-CM | POA: Diagnosis not present

## 2023-05-02 ENCOUNTER — Encounter (HOSPITAL_COMMUNITY): Payer: Self-pay

## 2023-05-02 ENCOUNTER — Other Ambulatory Visit: Payer: Self-pay

## 2023-05-02 ENCOUNTER — Inpatient Hospital Stay (HOSPITAL_COMMUNITY)
Admission: EM | Admit: 2023-05-02 | Discharge: 2023-05-04 | DRG: 603 | Disposition: A | Discharge status: Home / Self Care | Source: Ambulatory Visit | Attending: Internal Medicine | Admitting: Internal Medicine

## 2023-05-02 ENCOUNTER — Emergency Department (HOSPITAL_COMMUNITY)

## 2023-05-02 DIAGNOSIS — E1165 Type 2 diabetes mellitus with hyperglycemia: Secondary | ICD-10-CM | POA: Diagnosis not present

## 2023-05-02 DIAGNOSIS — Z87891 Personal history of nicotine dependence: Secondary | ICD-10-CM

## 2023-05-02 DIAGNOSIS — E119 Type 2 diabetes mellitus without complications: Secondary | ICD-10-CM

## 2023-05-02 DIAGNOSIS — Z8249 Family history of ischemic heart disease and other diseases of the circulatory system: Secondary | ICD-10-CM | POA: Diagnosis not present

## 2023-05-02 DIAGNOSIS — Z823 Family history of stroke: Secondary | ICD-10-CM | POA: Diagnosis not present

## 2023-05-02 DIAGNOSIS — J449 Chronic obstructive pulmonary disease, unspecified: Secondary | ICD-10-CM | POA: Diagnosis present

## 2023-05-02 DIAGNOSIS — N179 Acute kidney failure, unspecified: Secondary | ICD-10-CM | POA: Diagnosis not present

## 2023-05-02 DIAGNOSIS — E1143 Type 2 diabetes mellitus with diabetic autonomic (poly)neuropathy: Secondary | ICD-10-CM | POA: Diagnosis present

## 2023-05-02 DIAGNOSIS — I10 Essential (primary) hypertension: Secondary | ICD-10-CM | POA: Diagnosis present

## 2023-05-02 DIAGNOSIS — Z7901 Long term (current) use of anticoagulants: Secondary | ICD-10-CM | POA: Diagnosis not present

## 2023-05-02 DIAGNOSIS — E875 Hyperkalemia: Secondary | ICD-10-CM | POA: Diagnosis not present

## 2023-05-02 DIAGNOSIS — Z79899 Other long term (current) drug therapy: Secondary | ICD-10-CM | POA: Diagnosis not present

## 2023-05-02 DIAGNOSIS — I480 Paroxysmal atrial fibrillation: Secondary | ICD-10-CM | POA: Diagnosis present

## 2023-05-02 DIAGNOSIS — K3184 Gastroparesis: Secondary | ICD-10-CM | POA: Diagnosis not present

## 2023-05-02 DIAGNOSIS — R0602 Shortness of breath: Secondary | ICD-10-CM | POA: Diagnosis not present

## 2023-05-02 DIAGNOSIS — G8929 Other chronic pain: Secondary | ICD-10-CM | POA: Diagnosis present

## 2023-05-02 DIAGNOSIS — Z299 Encounter for prophylactic measures, unspecified: Secondary | ICD-10-CM | POA: Diagnosis not present

## 2023-05-02 DIAGNOSIS — Z7985 Long-term (current) use of injectable non-insulin antidiabetic drugs: Secondary | ICD-10-CM | POA: Diagnosis not present

## 2023-05-02 DIAGNOSIS — G4733 Obstructive sleep apnea (adult) (pediatric): Secondary | ICD-10-CM | POA: Diagnosis present

## 2023-05-02 DIAGNOSIS — Z6841 Body Mass Index (BMI) 40.0 and over, adult: Secondary | ICD-10-CM

## 2023-05-02 DIAGNOSIS — F32A Depression, unspecified: Secondary | ICD-10-CM | POA: Diagnosis present

## 2023-05-02 DIAGNOSIS — Z7984 Long term (current) use of oral hypoglycemic drugs: Secondary | ICD-10-CM | POA: Diagnosis not present

## 2023-05-02 DIAGNOSIS — E1342 Other specified diabetes mellitus with diabetic polyneuropathy: Secondary | ICD-10-CM | POA: Diagnosis not present

## 2023-05-02 DIAGNOSIS — L03116 Cellulitis of left lower limb: Principal | ICD-10-CM | POA: Diagnosis present

## 2023-05-02 DIAGNOSIS — R339 Retention of urine, unspecified: Secondary | ICD-10-CM | POA: Diagnosis present

## 2023-05-02 DIAGNOSIS — R600 Localized edema: Secondary | ICD-10-CM | POA: Diagnosis not present

## 2023-05-02 DIAGNOSIS — Z90710 Acquired absence of both cervix and uterus: Secondary | ICD-10-CM

## 2023-05-02 DIAGNOSIS — Z794 Long term (current) use of insulin: Secondary | ICD-10-CM

## 2023-05-02 DIAGNOSIS — I5032 Chronic diastolic (congestive) heart failure: Secondary | ICD-10-CM | POA: Diagnosis not present

## 2023-05-02 LAB — BASIC METABOLIC PANEL WITH GFR
Anion gap: 8 (ref 5–15)
BUN: 30 mg/dL — ABNORMAL HIGH (ref 8–23)
CO2: 20 mmol/L — ABNORMAL LOW (ref 22–32)
Calcium: 9.8 mg/dL (ref 8.9–10.3)
Chloride: 104 mmol/L (ref 98–111)
Creatinine, Ser: 1.27 mg/dL — ABNORMAL HIGH (ref 0.44–1.00)
GFR, Estimated: 48 mL/min — ABNORMAL LOW (ref >60)
Glucose, Bld: 168 mg/dL — ABNORMAL HIGH (ref 70–99)
Potassium: 6.3 mmol/L (ref 3.5–5.1)
Sodium: 132 mmol/L — ABNORMAL LOW (ref 135–145)

## 2023-05-02 LAB — URINALYSIS, W/ REFLEX TO CULTURE (INFECTION SUSPECTED)
Bacteria, UA: NONE SEEN
Bilirubin Urine: NEGATIVE
Glucose, UA: NEGATIVE mg/dL
Hgb urine dipstick: NEGATIVE
Ketones, ur: NEGATIVE mg/dL
Leukocytes,Ua: NEGATIVE
Nitrite: NEGATIVE
Protein, ur: NEGATIVE mg/dL
Specific Gravity, Urine: 1.01 (ref 1.005–1.030)
pH: 7 (ref 5.0–8.0)

## 2023-05-02 LAB — CBC
HCT: 32.6 % — ABNORMAL LOW (ref 36.0–46.0)
Hemoglobin: 10.1 g/dL — ABNORMAL LOW (ref 12.0–15.0)
MCH: 28.1 pg (ref 26.0–34.0)
MCHC: 31 g/dL (ref 30.0–36.0)
MCV: 90.6 fL (ref 80.0–100.0)
Platelets: 338 10*3/uL (ref 150–400)
RBC: 3.6 MIL/uL — ABNORMAL LOW (ref 3.87–5.11)
RDW: 13 % (ref 11.5–15.5)
WBC: 11 10*3/uL — ABNORMAL HIGH (ref 4.0–10.5)
nRBC: 0 % (ref 0.0–0.2)

## 2023-05-02 LAB — HEPATIC FUNCTION PANEL
ALT: 13 U/L (ref 0–44)
AST: 20 U/L (ref 15–41)
Albumin: 3.7 g/dL (ref 3.5–5.0)
Alkaline Phosphatase: 70 U/L (ref 38–126)
Bilirubin, Direct: <0.1 mg/dL (ref 0.0–0.2)
Total Bilirubin: 0.3 mg/dL (ref 0.0–1.2)
Total Protein: 7.1 g/dL (ref 6.5–8.1)

## 2023-05-02 LAB — TROPONIN I (HIGH SENSITIVITY)
Troponin I (High Sensitivity): 3 ng/L (ref <18)
Troponin I (High Sensitivity): 3 ng/L (ref <18)

## 2023-05-02 LAB — PROTIME-INR
INR: 1.1 (ref 0.8–1.2)
Prothrombin Time: 14.8 s (ref 11.4–15.2)

## 2023-05-02 LAB — LACTIC ACID, PLASMA: Lactic Acid, Venous: 1.2 mmol/L (ref 0.5–1.9)

## 2023-05-02 LAB — BRAIN NATRIURETIC PEPTIDE: B Natriuretic Peptide: 13 pg/mL (ref 0.0–100.0)

## 2023-05-02 MED ORDER — SODIUM CHLORIDE 0.9 % IV SOLN
1.0000 g | Freq: Once | INTRAVENOUS | Status: AC
  Administered 2023-05-03: 1 g via INTRAVENOUS
  Filled 2023-05-02: qty 10

## 2023-05-02 MED ORDER — FUROSEMIDE 10 MG/ML IJ SOLN
40.0000 mg | Freq: Once | INTRAMUSCULAR | Status: AC
  Administered 2023-05-02: 40 mg via INTRAVENOUS
  Filled 2023-05-02: qty 4

## 2023-05-02 MED ORDER — DEXTROSE 50 % IV SOLN
1.0000 | Freq: Once | INTRAVENOUS | Status: AC
  Administered 2023-05-02: 50 mL via INTRAVENOUS
  Filled 2023-05-02: qty 50

## 2023-05-02 MED ORDER — ALBUTEROL SULFATE (2.5 MG/3ML) 0.083% IN NEBU
2.5000 mg | INHALATION_SOLUTION | Freq: Once | RESPIRATORY_TRACT | Status: AC
  Administered 2023-05-02: 2.5 mg via RESPIRATORY_TRACT
  Filled 2023-05-02: qty 3

## 2023-05-02 MED ORDER — SODIUM CHLORIDE 0.9 % IV SOLN
2.0000 g | INTRAVENOUS | Status: DC
  Administered 2023-05-03: 2 g via INTRAVENOUS
  Filled 2023-05-02: qty 20

## 2023-05-02 MED ORDER — SODIUM CHLORIDE 0.9 % IV SOLN
1.0000 g | Freq: Once | INTRAVENOUS | Status: AC
  Administered 2023-05-02: 1 g via INTRAVENOUS
  Filled 2023-05-02: qty 10

## 2023-05-02 MED ORDER — SODIUM ZIRCONIUM CYCLOSILICATE 5 G PO PACK
10.0000 g | PACK | Freq: Once | ORAL | Status: AC
  Administered 2023-05-02: 10 g via ORAL
  Filled 2023-05-02: qty 2

## 2023-05-02 MED ORDER — VANCOMYCIN HCL 2000 MG/400ML IV SOLN
2000.0000 mg | Freq: Once | INTRAVENOUS | Status: AC
  Administered 2023-05-02: 2000 mg via INTRAVENOUS
  Filled 2023-05-02: qty 400

## 2023-05-02 MED ORDER — INSULIN ASPART 100 UNIT/ML IV SOLN
5.0000 [IU] | Freq: Once | INTRAVENOUS | Status: AC
  Administered 2023-05-02: 5 [IU] via INTRAVENOUS

## 2023-05-02 MED ORDER — SODIUM CHLORIDE 0.9 % IV BOLUS
500.0000 mL | Freq: Once | INTRAVENOUS | Status: AC
  Administered 2023-05-02: 500 mL via INTRAVENOUS

## 2023-05-02 MED ORDER — VANCOMYCIN HCL IN DEXTROSE 1-5 GM/200ML-% IV SOLN: 1000.0000 mg | Freq: Once | INTRAVENOUS | Status: DC

## 2023-05-02 NOTE — Assessment & Plan Note (Signed)
Stable.  Resume home regimen 

## 2023-05-02 NOTE — ED Provider Notes (Signed)
 [place] EMERGENCY DEPARTMENT AT [facility] Provider Note   CSN: [id number] Arrival date & time: 05/02/23  1609     History  Chief Complaint  Patient presents with   Leg Swelling   Shortness of Breath   Cellulitis    [name] [name] is a 63 y.o. female.   Shortness of Breath Patient presents for multiple complaints.  Medical history includes OSA, gastroparesis, atrial fibrillation, HTN, depression, fibromyalgia, COPD, DM.  She has had ongoing swelling to bilateral legs, greater on the left.  Left leg has had erythema and drainage.  She completed a course of antibiotics but does not recall which 1.  Swelling and redness has not improved.  Additionally, patient has felt fatigue, shortness of breath for the past 5 days.  Current home medications include 80 mg of Lasix  twice daily and 200 mEq of potassium per day.  She was seen by her primary care doctor earlier today due to the symptoms.  She was sent to the ED for further evaluation.     Home Medications Prior to Admission medications   Medication Sig Start Date End Date Taking? Authorizing Provider  atorvastatin  (LIPITOR) 40 MG tablet Take 40 mg by mouth daily. 07/06/20  Yes [provider]  baclofen  (LIORESAL ) 20 MG tablet Take 20 mg by mouth 3 (three) times daily.   Yes [provider]  CARTIA  XT 120 MG 24 hr capsule TAKE ONE CAPSULE BY MOUTH EVERY DAY 09/22/22  Yes Gerard Knight, MD  dexlansoprazole  (DEXILANT ) 60 MG capsule Take 1 capsule (60 mg total) by mouth daily. 11/14/22  Yes Delman Ferns, NP  diclofenac sodium (VOLTAREN) 1 % GEL Apply 2-4 g topically 4 (four) times daily as needed (for pain).    Yes [provider]  docusate sodium  (COLACE) 100 MG capsule Take 100 mg by mouth 2 (two) times daily.   Yes [provider]  ferrous sulfate 325 (65 FE) MG EC tablet Take 325 mg by mouth daily with breakfast.   Yes [provider]  flecainide  (TAMBOCOR ) 50 MG tablet TAKE  2 TABLETS BY MOUTH EVERY MORNING and TAKE 2 TABLETS BY MOUTH EVERY EVENING 03/13/23  Yes Gerard Knight, MD  fluticasone  (FLONASE ) 50 MCG/ACT nasal spray INSTILL 2 SPRAYS IN EACH NOSTRIL EVERY DAY 04/12/23  Yes Alva, Rakesh V, MD  furosemide  (LASIX ) 80 MG tablet Take 80 mg by mouth 2 (two) times daily.   Yes [provider]  gabapentin  (NEURONTIN ) 800 MG tablet Take 800 mg by mouth 4 (four) times daily.  10/09/17  Yes [provider]  glimepiride (AMARYL) 4 MG tablet Take 4 mg by mouth 2 (two) times daily. 08/16/18  Yes [provider]  hydrocortisone  (ANUSOL -HC) 2.5 % rectal cream Place 1 Application rectally 2 (two) times daily. For rectal bleeding 11/14/22  Yes Delman Ferns, NP  isosorbide  mononitrate (IMDUR ) 120 MG 24 hr tablet Take 120 mg by mouth daily. In the morning   Yes [provider]  isosorbide  mononitrate (IMDUR ) 30 MG 24 hr tablet Take 30 mg by mouth every evening.   Yes [provider]  linaclotide  (LINZESS ) 290 MCG CAPS capsule Take 1 capsule (290 mcg total) by mouth daily before breakfast. 11/14/22  Yes Delman Ferns, NP  lisinopril  (PRINIVIL ,ZESTRIL ) 10 MG tablet Take 10 mg by mouth every evening.    Yes [provider]  loratadine  (CLARITIN ) 10 MG tablet Take 1 tablet (10 mg total) by mouth daily. 11/25/19  Yes Lind Repine, MD  meclizine  (ANTIVERT ) 25 MG tablet Take 25 mg by mouth 3 (three) times daily. 07/31/13  Yes [provider]  metFORMIN  (GLUCOPHAGE ) 500 MG tablet Take 500-1,000 mg by mouth 2 (two) times daily. Take 1,000mg  in the Morning and 500 mg in the evening 05/18/13  Yes [provider]  metolazone (ZAROXOLYN) 2.5 MG tablet Take 5 mg by mouth daily with breakfast.   Yes [provider]  Multiple Vitamin (MULTIVITAMIN WITH MINERALS) TABS tablet Take 1 tablet by mouth daily.   Yes [provider]  mupirocin ointment (BACTROBAN) 2 % Apply 1 Application topically in the morning and  at bedtime. 06/30/21  Yes [provider]  MYRBETRIQ 50 MG TB24 tablet Take 50 mg by mouth daily.   Yes [provider]  nitroGLYCERIN  (NITROLINGUAL ) 0.4 MG/SPRAY spray USE 1 SPRAY UNDER THE TONGUE EVERY 5 MINUTES UP TO 3 DOSES AS NEEDED FOR CHEST PAIN. GO TO ER IF NOT RESOLVED 07/25/22  Yes Gerard Knight, MD  NUCYNTA  ER 100 MG 12 hr tablet Take 100 mg by mouth 2 (two) times daily. 12/04/19  Yes [provider]  nystatin  (MYCOSTATIN /NYSTOP ) 100000 UNIT/GM POWD Apply 1 g topically daily.   Yes [provider]  ondansetron  (ZOFRAN -ODT) 8 MG disintegrating tablet Take 8 mg by mouth daily.   Yes [provider]  OTEZLA  30 MG TABS Take 1 tablet by mouth 2 (two) times daily.   Yes [provider]  oxyCODONE -acetaminophen  (PERCOCET) 10-325 MG per tablet Take 1 tablet by mouth every 4 (four) hours as needed for pain.   Yes [provider]  OXYGEN  Inhale 2 L into the lungs as needed (shortness of breath).   Yes [provider]  OZEMPIC, 1 MG/DOSE, 4 MG/3ML SOPN Inject 1 mg into the skin every Tuesday. 07/28/21  Yes [provider]  potassium chloride  SA (K-DUR,KLOR-CON ) 20 MEQ tablet Take 40 mEq by mouth 5 (five) times daily.   Yes [provider]  Prucalopride Succinate  (MOTEGRITY ) 2 MG TABS Take 1 tablet (2 mg total) by mouth daily. 11/14/22  Yes Delman Ferns, NP  rivaroxaban  (XARELTO ) 20 MG TABS tablet Take 20 mg by mouth daily with supper.    Yes [provider]  sodium chloride  (OCEAN) 0.65 % SOLN nasal spray Place 1 spray into both nostrils daily.   Yes [provider]  spironolactone  (ALDACTONE ) 50 MG tablet Take 50 mg by mouth 3 (three) times daily.   Yes [provider]  TOUJEO  SOLOSTAR 300 UNIT/ML Solostar Pen Inject 43 Units into the skin at bedtime. 11/12/19  Yes [provider]  umeclidinium-vilanterol (ANORO ELLIPTA ) 62.5-25 MCG/ACT AEPB Inhale 1 puff into the lungs  daily. 03/20/23  Yes Antonio Baumgarten, NP  VASCEPA  1 g CAPS Take 1 g by mouth 4 (four) times daily. 08/16/18  Yes [provider]  DEXILANT  60 MG capsule TAKE ONE CAPSULE BY MOUTH EVERY DAY 12/20/21   Delman Ferns, NP      Allergies    Levofloxacin, Tape, Wound dressing adhesive, Hyoscyamine sulfate, and Metoclopramide    Review of Systems   Review of Systems  Constitutional:  Positive for activity change and fatigue.  Respiratory:  Positive for shortness of breath.   Cardiovascular:  Positive for leg swelling.  Skin:  Positive for color change.  Neurological:  Positive for weakness (Generalized).  All other systems reviewed and are negative.   Physical Exam Updated Vital Signs BP (!) 128/44  Pulse 88   Temp 98.4 F (36.9 C) (Oral)   Resp 20   Ht 5\' 8"  (1.727 m)   Wt (!) 145.9 kg   SpO2 100%   BMI 48.91 kg/m  Physical Exam Vitals and nursing note reviewed.  Constitutional:      General: She is not in acute distress.    Appearance: She is well-developed. She is obese. She is not ill-appearing, toxic-appearing or diaphoretic.  HENT:     Head: Normocephalic and atraumatic.     Mouth/Throat:     Mouth: Mucous membranes are moist.  Eyes:     Conjunctiva/sclera: Conjunctivae normal.  Cardiovascular:     Rate and Rhythm: Normal rate and regular rhythm.     Heart sounds: No murmur heard. Pulmonary:     Effort: Pulmonary effort is normal. No tachypnea, accessory muscle usage or respiratory distress.     Breath sounds: Normal breath sounds.  Abdominal:     Palpations: Abdomen is soft.     Tenderness: There is no abdominal tenderness.  Musculoskeletal:        General: No swelling.     Cervical back: Neck supple.     Right lower leg: Edema present.     Left lower leg: Tenderness present. Edema present.  Skin:    General: Skin is warm and dry.     Findings: Erythema present.  Neurological:     General: No focal deficit present.     Mental Status: She is alert  and oriented to person, place, and time.  Psychiatric:        Mood and Affect: Mood normal.        Behavior: Behavior normal.     ED Results / Procedures / Treatments   Labs (all labs ordered are listed, but only abnormal results are displayed) Labs Reviewed  BASIC METABOLIC PANEL WITH GFR - Abnormal; Notable for the following components:      Result Value   Sodium 132 (*)    Potassium 6.3 (*)    CO2 20 (*)    Glucose, Bld 168 (*)    BUN 30 (*)    Creatinine, Ser 1.27 (*)    GFR, Estimated 48 (*)    All other components within normal limits  CBC - Abnormal; Notable for the following components:   WBC 11.0 (*)    RBC 3.60 (*)    Hemoglobin 10.1 (*)    HCT 32.6 (*)    All other components within normal limits  URINALYSIS, W/ REFLEX TO CULTURE (INFECTION SUSPECTED) - Abnormal; Notable for the following components:   Color, Urine STRAW (*)    All other components within normal limits  CULTURE, BLOOD (ROUTINE X 2)  CULTURE, BLOOD (ROUTINE X 2)  BRAIN NATRIURETIC PEPTIDE  LACTIC ACID, PLASMA  PROTIME-INR  HEPATIC FUNCTION PANEL  POTASSIUM  HIV ANTIBODY (ROUTINE TESTING W REFLEX)  BASIC METABOLIC PANEL WITH GFR  CBC  HEMOGLOBIN A1C  TROPONIN I (HIGH SENSITIVITY)  TROPONIN I (HIGH SENSITIVITY)    EKG EKG Interpretation Date/Time:  Wednesday May 02 2023 16:44:03 EDT Ventricular Rate:  71 PR Interval:  208 QRS Duration:  128 QT Interval:  384 QTC Calculation: 417 R Axis:   -48  Text Interpretation: Normal sinus rhythm Left axis deviation Non-specific intra-ventricular conduction block Minimal voltage criteria for LVH, may be normal variant ( Cornell product ) Cannot rule out Septal infarct , age undetermined Abnormal ECG Confirmed by Iva Mariner [phone]) on 05/02/2023 8:50:06 PM  Radiology DG  Chest 2 View Result Date: 05/02/2023 CLINICAL DATA:  Shortness of breath.  Lower extremity edema. EXAM: CHEST - 2 VIEW COMPARISON:  Chest radiograph dated 03/20/2023 FINDINGS: No  focal consolidation, pleural effusion, or pneumothorax. The cardiac silhouette is within normal limits. No acute osseous pathology. IMPRESSION: No active cardiopulmonary disease. Electronically Signed   By: Angus Bark M.D.   On: 05/02/2023 17:22    Procedures Procedures    Medications Ordered in ED Medications  cefTRIAXone  (ROCEPHIN ) 2 g in sodium chloride  0.9 % 100 mL IVPB (has no administration in time range)  atorvastatin  (LIPITOR) tablet 40 mg (has no administration in time range)  baclofen  (LIORESAL ) tablet 20 mg (has no administration in time range)  diltiazem  (CARDIZEM  CD) 24 hr capsule 120 mg (has no administration in time range)  pantoprazole  (PROTONIX ) EC tablet 40 mg (has no administration in time range)  docusate sodium  (COLACE) capsule 100 mg (has no administration in time range)  flecainide  (TAMBOCOR ) tablet 100 mg (has no administration in time range)  fluticasone  (FLONASE ) 50 MCG/ACT nasal spray 2 spray (has no administration in time range)  gabapentin  (NEURONTIN ) capsule 800 mg (has no administration in time range)  linaclotide  (LINZESS ) capsule 290 mcg (has no administration in time range)  meclizine  (ANTIVERT ) tablet 25 mg (has no administration in time range)  tapentadol  (NUCYNTA ) 12 hr tablet 100 mg (has no administration in time range)  nystatin  (MYCOSTATIN /NYSTOP ) topical powder 1 Application (has no administration in time range)  Apremilast  TABS 30 mg (has no administration in time range)  rivaroxaban  (XARELTO ) tablet 20 mg (has no administration in time range)  insulin  glargine-yfgn (SEMGLEE ) injection 15 Units (has no administration in time range)  umeclidinium-vilanterol (ANORO ELLIPTA ) 62.5-25 MCG/ACT 1 puff (has no administration in time range)  acetaminophen  (TYLENOL ) tablet 650 mg (has no administration in time range)    Or  acetaminophen  (TYLENOL ) suppository 650 mg (has no administration in time range)  ondansetron  (ZOFRAN ) tablet 4 mg (has no  administration in time range)    Or  ondansetron  (ZOFRAN ) injection 4 mg (has no administration in time range)  polyethylene glycol (MIRALAX  / GLYCOLAX ) packet 17 g (has no administration in time range)  insulin  aspart (novoLOG ) injection 0-15 Units (has no administration in time range)  insulin  aspart (novoLOG ) injection 0-5 Units (has no administration in time range)  vancomycin  (VANCOREADY) IVPB 1500 mg/300 mL (has no administration in time range)  oxyCODONE -acetaminophen  (PERCOCET/ROXICET) 5-325 MG per tablet 1 tablet (has no administration in time range)    And  oxyCODONE  (Oxy IR/ROXICODONE ) immediate release tablet 5 mg (has no administration in time range)  furosemide  (LASIX ) injection 40 mg (40 mg Intravenous Given 05/02/23 1934)  sodium chloride  0.9 % bolus 500 mL (0 mLs Intravenous Stopped 05/02/23 2208)  sodium zirconium cyclosilicate  (LOKELMA ) packet 10 g (10 g Oral Given 05/02/23 1934)  albuterol  (PROVENTIL ) (2.5 MG/3ML) 0.083% nebulizer solution 2.5 mg (2.5 mg Nebulization Given 05/02/23 2158)  insulin  aspart (novoLOG ) injection 5 Units (5 Units Intravenous Given 05/02/23 1932)    And  dextrose  50 % solution 50 mL (50 mLs Intravenous Given 05/02/23 1934)  cefTRIAXone  (ROCEPHIN ) 1 g in sodium chloride  0.9 % 100 mL IVPB (0 g Intravenous Stopped 05/02/23 2152)  vancomycin  (VANCOREADY) IVPB 2000 mg/400 mL (0 mg Intravenous Stopped 05/03/23 0011)  cefTRIAXone  (ROCEPHIN ) 1 g in sodium chloride  0.9 % 100 mL IVPB (0 g Intravenous Stopped 05/03/23 0048)    ED Course/ Medical Decision Making/ A&P  Medical Decision Making Amount and/or Complexity of Data Reviewed Labs: ordered. Radiology: ordered.  Risk OTC drugs. Prescription drug management. Decision regarding hospitalization.   This patient presents to the ED for concern of multiple complaints, this involves an extensive number of treatment options, and is a complaint that carries with it a high risk of  complications and morbidity.  The differential diagnosis includes pharmacy, infection, deconditioning, CHF exacerbation, metabolic derangements   Co morbidities that complicate the patient evaluation  OSA, gastroparesis, atrial fibrillation, HTN, depression, fibromyalgia, COPD, DM   Additional history obtained:  Additional history obtained from N/A External records from outside source obtained and reviewed including EMR   Lab Tests:  I Ordered, and personally interpreted labs.  The pertinent results include: He has baseline.  Mildly cytosis is present.  Patient has AKI and hyperkalemia.  Hyperkalemia likely iatrogenic in setting of AKI given her large amount of daily potassium supplement use.   Imaging Studies ordered:  I ordered imaging studies including chest x-ray I independently visualized and interpreted imaging which showed no acute findings I agree with the radiologist interpretation   Cardiac Monitoring: / EKG:  The patient was maintained on a cardiac monitor.  I personally viewed and interpreted the cardiac monitored which showed an underlying rhythm of: Sinus rhythm   Problem List / ED Course / Critical interventions / Medication management  Patient presenting for multiple complaints.  Workup initiated prior to patient being bedded in the ED.  Initial lab work notable for AKI and hyperkalemia.  No EKG changes are noted.  On exam, patient is overall well-appearing.  She has bilateral leg swelling, however, this is greater on the left.  Left leg has erythema, warmth, and tenderness.  Antibiotics were ordered for empiric treatment of cellulitis.  Temporizing medications were ordered for hyperkalemia.  I suspect her hyperkalemia is iatrogenic.  She states that she takes 200 mEq of potassium per day.  Patient's current Lasix  dosing is 80 mg twice daily.  She has been adherent to her home medications.  Patient remained hemodynamically stable.  She was admitted for further  management. I ordered medication including IVF, Lasix , albuterol , dextrose /insulin , Lokelma  for hyperkalemia; ceftriaxone  and vancomycin  for cellulitis Reevaluation of the patient after these medicines showed that the patient improved I have reviewed the patients home medicines and have made adjustments as needed   Social Determinants of Health:  Has access to outpatient care  CRITICAL CARE Performed by: Iva Mariner   Total critical care time: 35 minutes  Critical care time was exclusive of separately billable procedures and treating other patients.  Critical care was necessary to treat or prevent imminent or life-threatening deterioration.  Critical care was time spent personally by me on the following activities: development of treatment plan with patient and/or surrogate as well as nursing, discussions with consultants, evaluation of patient's response to treatment, examination of patient, obtaining history from patient or surrogate, ordering and performing treatments and interventions, ordering and review of laboratory studies, ordering and review of radiographic studies, pulse oximetry and re-evaluation of patient's condition.         Final Clinical Impression(s) / ED Diagnoses Final diagnoses:  Hyperkalemia  AKI (acute kidney injury) (HCC)  Cellulitis of left lower extremity    Rx / DC Orders ED Discharge Orders     None         Iva Mariner, MD 05/03/23 0157

## 2023-05-02 NOTE — Assessment & Plan Note (Addendum)
 Stable.  Currently in sinus rhythm.  Reports compliance with Xarelto . -Resume Xarelto ,  - Resume Cartia  XT 120mg  daily -Resume flecainide 

## 2023-05-02 NOTE — Assessment & Plan Note (Signed)
 Resume home BiPAP nightly

## 2023-05-02 NOTE — ED Triage Notes (Signed)
 Pt sent from PCP for severe leg edema, SOB, cellulitis and fatigue. Pt endorses fluid leaking from legs, has pads in place to catch fluid. States she's more of a fall risk because she feels weak in the legs. Uses Bipap at night Denies CP, endorse hx of Afib.

## 2023-05-02 NOTE — Assessment & Plan Note (Addendum)
-   HgbA1c - SSI- M - Hold glimepiride, metformin , Ozempic -Resume home insulin  Toujeo at reduced dose 15 units nightly (dose 43 units)

## 2023-05-02 NOTE — H&P (Signed)
 History and Physical    [name] [name]:[id number] DOB: 1960-08-15 DOA: 05/02/2023  PCP: Orlena Bitters, MD   Patient coming from: Home  I have personally briefly reviewed patient's old medical records in [facility]  Chief Complaint: Leg swelling  HPI: [name] [name] is a 63 y.o. female with medical history significant for  COPD, HTN, diabetes mellitus, gastroparesis, atrial fibrillation, OSA, depression. Patient presented to the ED with complaints of lower extremity swelling worse on the left, with redness and pain.  She completed a course of Augmentin as outpatient, she reports the redness improved but over the past few days, it returned, and she had an open wound that was weeping.  She reports chills, generalized weakness over the past 4 days.  No vomiting or diarrhea.  She has been compliant with her medications including Xarelto , and Lasix  80 twice daily.  She also takes 200 mg of potassium every day. No chest pain.  Her breathing is at baseline.  ED Course: Temperature 98.  Heart rate 72-79.  Respiratory 16-18.  Blood pressure systolic 96-128.  O2 sats greater than 95% on room air.  Hyperkalemia potassium of 6.3.  WBC 11.  BNP 13.  Lactic acid 1.2. Chest x-ray negative for acute abnormality. IV ceftriaxone  started for left lower extremity cellulitis.  Lokelma, 500 mL bolus, Lasix  40 mg, D50 and 5 units of insulin  given hyperkalemia.  Review of Systems: As per HPI all other systems reviewed and negative.  Past Medical History:  Diagnosis Date   Bladder dysfunction    Self urinary catheterization   Complication of anesthesia    COPD (chronic obstructive pulmonary disease) (HCC)    DDD (degenerative disc disease)    Depression    Esophageal spasm    NTG and Norvasc   Essential hypertension    Fibromyalgia    Gastroparesis    GERD (gastroesophageal reflux disease)    History of cardiac catheterization    Normal coronary arteries   History of pituitary tumor     Irritable bowel syndrome    Lymphedema    Osteoarthritis    PAF (paroxysmal atrial fibrillation) (HCC)    PONV (postoperative nausea and vomiting)    Sleep apnea    BIPAP   Type 2 diabetes mellitus (HCC)    Urinary tract infection     Past Surgical History:  Procedure Laterality Date   ABDOMINAL HYSTERECTOMY     ANTERIOR CERVICAL DECOMP/DISCECTOMY FUSION N/A 12/02/2014   Procedure: Cervical five cervical six anterior cervical decompression with fusion interbody prosthesis plating and bone graft;  Surgeon: Garry Kansas, MD;  Location: [facility]UR[facility] OR[facility];  Service: Neurosurgery;  Laterality: N/A;  C56 anterior cervical decompression with fusion interbody prosthesis plating and bonegraft   APPENDECTOMY     BACK SURGERY     CARDIAC CATHETERIZATION     CARDIOVERSION N/A 03/26/2014   Procedure: CARDIOVERSION;  Surgeon: Flavia Hughs, MD;  Location: [facility] ORS;  Service: Endoscopy;  Laterality: N/A;   CARDIOVERSION N/A 05/04/2014   Procedure: CARDIOVERSION;  Surgeon: Gerard Knight, MD;  Location: [facility] ORS;  Service: Cardiovascular;  Laterality: N/A;   CHOLECYSTECTOMY     COLONOSCOPY     Approximately 2003. Per medical records, internal hemorrhoids noted   COLONOSCOPY N/A 06/25/2013   Dr. Riley Cheadle: Anal canal hemorrhoids-more likely the source of paper hematochezia. Redundant, capacious colon. Multiple colonic polyps-tubular adenoma   COLONOSCOPY WITH PROPOFOL  N/A 12/04/2016   Dr. Riley Cheadle: redundant colon, colonic diverticulosis. Screening due in 2023  COLONOSCOPY WITH PROPOFOL  N/A 08/11/2021   Procedure: COLONOSCOPY WITH PROPOFOL ;  Surgeon: Suzette Espy, MD;  Location: [facility];  Service: Endoscopy;  Laterality: N/A;  9:15am   ENDOVENOUS ABLATION SAPHENOUS VEIN W/ LASER Left 03/07/2018   endovenous ablation left greater saphenous vein by Brayton Calin MD    ESOPHAGOGASTRODUODENOSCOPY  10/12/2004   [id number] plaquing on the esophageal mucosa of uncertain significance, not typical of  what is seen with candida esophagitis status post KOH brushing for KOH prep and biopsy for histology. Rule out candida esophagitis/eosinophilic esophagitis. Otherwise normal esophagus. Tiny hiatal hernia. Otherwise, normal stomach, normal D1 and D2. Benign biopsy of esophagus, unknown KOH status.    ESOPHAGOGASTRODUODENOSCOPY N/A 06/25/2013   Dr. Rourk:mild chronic gastritis   KNEE SURGERY     LEFT HEART CATHETERIZATION WITH CORONARY ANGIOGRAM N/A 11/15/2010   Procedure: LEFT HEART CATHETERIZATION WITH CORONARY ANGIOGRAM;  Surgeon: Jessica Morn, MD;  Location: [facility] CATH LAB;  Service: Cardiovascular;  Laterality: N/A;   LUNG BIOPSY     POLYPECTOMY  08/11/2021   Procedure: POLYPECTOMY INTESTINAL;  Surgeon: Suzette Espy, MD;  Location: [facility];  Service: Endoscopy;;   RIGHT/LEFT HEART CATH AND CORONARY ANGIOGRAPHY N/A 08/28/2018   Procedure: RIGHT/LEFT HEART CATH AND CORONARY ANGIOGRAPHY;  Surgeon: Odie Benne, MD;  Location: [facility] INVASIVE CV LAB;  Service: Cardiovascular;  Laterality: N/A;     reports that she quit smoking about 16 years ago. Her smoking use included cigarettes. She started smoking about 46 years ago. She has a 45 pack-year smoking history. She has never used smokeless tobacco. She reports that she does not drink alcohol  and does not use drugs.  Allergies  Allergen Reactions   Levofloxacin Other (See Comments)    Psychosis   Tape Other (See Comments)    Welts, bad place on skin. Paper tape is okay   Wound Dressing Adhesive Hives    Welts, bad place on skin. Paper tape is okay   Hyoscyamine Sulfate Palpitations   Metoclopramide Other (See Comments)    Tremors     Family History  Problem Relation Age of Onset   Coronary artery disease Father        Died age 77   Heart attack Father    Arrhythmia Father        AF   Diabetes Father    Parkinson's disease Father    Arrhythmia Mother        AF   Stroke Mother    Dementia Mother    Cancer Mother         UTERINE    Parkinson's disease Mother    Arrhythmia Brother        AF   Colon cancer Maternal Grandfather    Colon cancer Paternal Aunt    Colon cancer Paternal Aunt    Diabetes Son    Cancer Sister        BREAST    Depression Sister    Breast cancer Sister    Depression Sister    Depression Sister    Breast cancer Maternal Aunt     Prior to Admission medications   Medication Sig Start Date End Date Taking? Authorizing Provider  atorvastatin (LIPITOR) 40 MG tablet Take 40 mg by mouth daily. 07/06/20  Yes [provider]  baclofen  (LIORESAL ) 20 MG tablet Take 20 mg by mouth 3 (three) times daily.   Yes [provider]  CARTIA  XT 120 MG 24 hr capsule TAKE ONE CAPSULE BY  MOUTH EVERY DAY 09/22/22  Yes Gerard Knight, MD  dexlansoprazole  (DEXILANT ) 60 MG capsule Take 1 capsule (60 mg total) by mouth daily. 11/14/22  Yes Delman Ferns, NP  diclofenac sodium (VOLTAREN) 1 % GEL Apply 2-4 g topically 4 (four) times daily as needed (for pain).    Yes [provider]  docusate sodium  (COLACE) 100 MG capsule Take 100 mg by mouth 2 (two) times daily.   Yes [provider]  ferrous sulfate 325 (65 FE) MG EC tablet Take 325 mg by mouth daily with breakfast.   Yes [provider]  flecainide  (TAMBOCOR ) 50 MG tablet TAKE 2 TABLETS BY MOUTH EVERY MORNING and TAKE 2 TABLETS BY MOUTH EVERY EVENING 03/13/23  Yes Gerard Knight, MD  fluticasone  (FLONASE ) 50 MCG/ACT nasal spray INSTILL 2 SPRAYS IN EACH NOSTRIL EVERY DAY 04/12/23  Yes Alva, Rakesh V, MD  furosemide  (LASIX ) 80 MG tablet Take 80 mg by mouth 2 (two) times daily.   Yes [provider]  gabapentin  (NEURONTIN ) 800 MG tablet Take 800 mg by mouth 4 (four) times daily.  10/09/17  Yes [provider]  glimepiride (AMARYL) 4 MG tablet Take 4 mg by mouth 2 (two) times daily. 08/16/18  Yes [provider]  hydrocortisone  (ANUSOL -HC) 2.5 % rectal cream Place 1 Application  rectally 2 (two) times daily. For rectal bleeding 11/14/22  Yes Delman Ferns, NP  isosorbide  mononitrate (IMDUR ) 120 MG 24 hr tablet Take 120 mg by mouth daily. In the morning   Yes [provider]  isosorbide  mononitrate (IMDUR ) 30 MG 24 hr tablet Take 30 mg by mouth every evening.   Yes [provider]  linaclotide  (LINZESS ) 290 MCG CAPS capsule Take 1 capsule (290 mcg total) by mouth daily before breakfast. 11/14/22  Yes Delman Ferns, NP  lisinopril  (PRINIVIL ,ZESTRIL ) 10 MG tablet Take 10 mg by mouth every evening.    Yes [provider]  loratadine  (CLARITIN ) 10 MG tablet Take 1 tablet (10 mg total) by mouth daily. 11/25/19  Yes Lind Repine, MD  meclizine  (ANTIVERT ) 25 MG tablet Take 25 mg by mouth 3 (three) times daily. 07/31/13  Yes [provider]  metFORMIN  (GLUCOPHAGE ) 500 MG tablet Take 500-1,000 mg by mouth 2 (two) times daily. Take 1,000mg  in the Morning and 500 mg in the evening 05/18/13  Yes [provider]  metolazone (ZAROXOLYN) 2.5 MG tablet Take 5 mg by mouth daily with breakfast.   Yes [provider]  Multiple Vitamin (MULTIVITAMIN WITH MINERALS) TABS tablet Take 1 tablet by mouth daily.   Yes [provider]  mupirocin ointment (BACTROBAN) 2 % Apply 1 Application topically in the morning and at bedtime. 06/30/21  Yes [provider]  MYRBETRIQ 50 MG TB24 tablet Take 50 mg by mouth daily.   Yes [provider]  nitroGLYCERIN  (NITROLINGUAL ) 0.4 MG/SPRAY spray USE 1 SPRAY UNDER THE TONGUE EVERY 5 MINUTES UP TO 3 DOSES AS NEEDED FOR CHEST PAIN. GO TO ER IF NOT RESOLVED 07/25/22  Yes Gerard Knight, MD  NUCYNTA ER 100 MG 12 hr tablet Take 100 mg by mouth 2 (two) times daily. 12/04/19  Yes [provider]  nystatin (MYCOSTATIN/NYSTOP) 100000 UNIT/GM POWD Apply 1 g topically daily.   Yes [provider]  ondansetron  (ZOFRAN -ODT) 8 MG disintegrating tablet Take 8 mg by mouth daily.   Yes  [provider]  OTEZLA 30 MG TABS Take 1 tablet by mouth 2 (two) times daily.  Yes [provider]  oxyCODONE -acetaminophen  (PERCOCET) 10-325 MG per tablet Take 1 tablet by mouth every 4 (four) hours as needed for pain.   Yes [provider]  OXYGEN  Inhale 2 L into the lungs as needed (shortness of breath).   Yes [provider]  OZEMPIC, 1 MG/DOSE, 4 MG/3ML SOPN Inject 1 mg into the skin every Tuesday. 07/28/21  Yes [provider]  potassium chloride  SA (K-DUR,KLOR-CON ) 20 MEQ tablet Take 40 mEq by mouth 5 (five) times daily.   Yes [provider]  Prucalopride Succinate  (MOTEGRITY ) 2 MG TABS Take 1 tablet (2 mg total) by mouth daily. 11/14/22  Yes Delman Ferns, NP  rivaroxaban  (XARELTO ) 20 MG TABS tablet Take 20 mg by mouth daily with supper.    Yes [provider]  sodium chloride  (OCEAN) 0.65 % SOLN nasal spray Place 1 spray into both nostrils daily.   Yes [provider]  spironolactone  (ALDACTONE ) 50 MG tablet Take 50 mg by mouth 3 (three) times daily.   Yes [provider]  TOUJEO SOLOSTAR 300 UNIT/ML Solostar Pen Inject 43 Units into the skin at bedtime. 11/12/19  Yes [provider]  umeclidinium-vilanterol (ANORO ELLIPTA ) 62.5-25 MCG/ACT AEPB Inhale 1 puff into the lungs daily. 03/20/23  Yes Antonio Baumgarten, NP  VASCEPA 1 g CAPS Take 1 g by mouth 4 (four) times daily. 08/16/18  Yes [provider]  DEXILANT  60 MG capsule TAKE ONE CAPSULE BY MOUTH EVERY DAY 12/20/21   Delman Ferns, NP    Physical Exam: Vitals:   05/02/23 1633 05/02/23 1647 05/02/23 1940 05/02/23 2030  BP:  (!) 96/58 (!) 128/48 (!) 113/54  Pulse:  72 78 79  Resp:  18 18 16   Temp:  98 F (36.7 C)    SpO2: 95% 95% 96% 97%  Weight: (!) 149.4 kg     Height: 5\' 8"  (1.727 m)       Constitutional: NAD, calm, comfortable Vitals:   05/02/23 1633 05/02/23 1647 05/02/23 1940 05/02/23 2030  BP:  (!) 96/58 (!) 128/48 (!)  113/54  Pulse:  72 78 79  Resp:  18 18 16   Temp:  98 F (36.7 C)    SpO2: 95% 95% 96% 97%  Weight: (!) 149.4 kg     Height: 5\' 8"  (1.727 m)      Eyes: PERRL, lids and conjunctivae normal ENMT: Mucous membranes are moist. .  Neck: normal, supple, no masses, no thyromegaly Respiratory: clear to auscultation bilaterally, no wheezing, no crackles. Normal respiratory effort. No accessory muscle use.  Cardiovascular: Regular rate and rhythm, no murmurs / rubs / gallops.  Trace right lower extremity edema, 2+ pitting left lower extremity edema.  Extremities warm, with slight increased differential warmth to left lower extremity compared to right. Abdomen: no tenderness, no masses palpated. No hepatosplenomegaly.  Musculoskeletal: no clubbing / cyanosis. No joint deformity upper and lower extremities.  Skin: no rashes, lesions, ulcers. No induration Neurologic: No facial asymmetry, moving extremities spontaneously, speech fluent  Psychiatric: Normal judgment and insight. Alert and oriented x 3. Normal mood.         Labs on Admission: I have personally reviewed following labs and imaging studies  CBC: Recent Labs  Lab 05/02/23 1744  WBC 11.0*  HGB 10.1*  HCT 32.6*  MCV 90.6  PLT 338   Basic Metabolic Panel: Recent Labs  Lab 05/02/23 1744  NA 132*  K 6.3*  CL 104  CO2 20*  GLUCOSE 168*  BUN 30*  CREATININE 1.27*  CALCIUM 9.8   GFR: Estimated Creatinine Clearance: 71.1 mL/min (A) (by C-G formula based on SCr of 1.27 mg/dL (H)). Liver Function Tests: Recent Labs  Lab 05/02/23 1928  AST 20  ALT 13  ALKPHOS 70  BILITOT 0.3  PROT 7.1  ALBUMIN 3.7   Coagulation Profile: Recent Labs  Lab 05/02/23 1928  INR 1.1   Urine analysis:    Component Value Date/Time   COLORURINE STRAW (A) 05/02/2023 2026   APPEARANCEUR CLEAR 05/02/2023 2026   LABSPEC 1.010 05/02/2023 2026   PHURINE 7.0 05/02/2023 2026   GLUCOSEU NEGATIVE 05/02/2023 2026   HGBUR NEGATIVE 05/02/2023  2026   BILIRUBINUR NEGATIVE 05/02/2023 2026   KETONESUR NEGATIVE 05/02/2023 2026   PROTEINUR NEGATIVE 05/02/2023 2026   NITRITE NEGATIVE 05/02/2023 2026   LEUKOCYTESUR NEGATIVE 05/02/2023 2026    Radiological Exams on Admission: DG Chest 2 View Result Date: 05/02/2023 CLINICAL DATA:  Shortness of breath.  Lower extremity edema. EXAM: CHEST - 2 VIEW COMPARISON:  Chest radiograph dated 03/20/2023 FINDINGS: No focal consolidation, pleural effusion, or pneumothorax. The cardiac silhouette is within normal limits. No acute osseous pathology. IMPRESSION: No active cardiopulmonary disease. Electronically Signed   By: Angus Bark M.D.   On: 05/02/2023 17:22    EKG: Independently reviewed.  Sinus rhythm, rate 71, QTc 417.  No significant change from prior.  Assessment/Plan Principal Problem:   Cellulitis of left lower extremity Active Problems:   Hyperkalemia   Obstructive sleep apnea   Gastroparesis due to DM (HCC)   Paroxysmal atrial fibrillation (HCC)   Essential hypertension, benign   Depression   Chronic obstructive pulmonary disease (HCC)   DM (diabetes mellitus) (HCC)   Chronic pain   Polypharmacy  Assessment and Plan: * Cellulitis of left lower extremity With erythema, swelling, pain to left lower extremity.  WBC 11.  Rules out for sepsis.  Lactic acid 1.2.  Failed outpatient therapy with Augmentin, open and weeping blister present.  Reports compliance with Xarelto . -IV ceftriaxone  2 g daily -Start vancomycin -Follow-up blood cultures -Left lower extremity venous Dopplers  Hyperkalemia Potassium 6.3 likely iatrogenic.  She is been taking 200 mg of potassium daily. -Hold home potassium  - Lokelma 10g, Lasix  40 and 100 mL bolus, 5 units insulin  with 50 mL of D50 and albuterol  neb given -Repeat K  Chronic pain Resume tapentadol - Also resume Linzess   DM (diabetes mellitus) (HCC) - HgbA1c - SSI- M - Hold glimepiride, metformin , Ozempic -Resume home insulin  Toujeo  at reduced dose 15 units nightly (dose 43 units)  Chronic obstructive pulmonary disease (HCC) Stable.  Resume home regimen.  Essential hypertension, benign Blood pressure soft-systolic down to the 90s on my evaluation. -Hold Lasix  80 mg twice daily for tonight, resume pending clinical course -Hold Imdur , lisinopril  10 mg -Hold cardia XT 120 mg  Paroxysmal atrial fibrillation (HCC) Stable.  Currently in sinus rhythm.  Reports compliance with Xarelto . -Resume Xarelto ,  - Resume Cartia  XT 120mg  daily -Resume flecainide   Obstructive sleep apnea Resume home BiPAP nightly   Polypharmacy  DVT prophylaxis: Xarelto  Code Status: FULL Family Communication: None at bedside Disposition Plan: ~ 2 days Consults called: None  Admission status:  Inpt tele  I certify that at the point of admission it is my clinical judgment that the patient will require inpatient hospital care spanning beyond 2 midnights from the point of admission due to high intensity of service, high risk for further deterioration and high frequency of surveillance required.  Author: Pati Bonine, MD 05/02/2023 11:05 PM  For on call review [email or web address].

## 2023-05-02 NOTE — ED Notes (Signed)
 Pt requesting to have an indwelling cath placed while in hospital since she will getting Lasix . Pt states she has urinary retention and has to self cath at least 8 times a day. EDP made aware. Awaiting orders.

## 2023-05-02 NOTE — Progress Notes (Signed)
 Pharmacy Antibiotic Note  WESLEE ELLERY is a 63 y.o. female admitted on 05/02/2023 with cellulitis. Pharmacy has been consulted for vancomycin  dosing.  Plan: Vancomycin  1500mg  q24h (eAUC 469, Scr 1.27, Vd 0.5) F/u renal function, infectious work up and length of therapy Vancomycin  levels as needed  Height: 5\' 8"  (172.7 cm) Weight: (!) 149.4 kg (329 lb 5.9 oz) IBW/kg (Calculated) : 63.9  Temp (24hrs), Avg:98 F (36.7 C), Min:98 F (36.7 C), Max:98 F (36.7 C)  Recent Labs  Lab 05/02/23 1744 05/02/23 1928  WBC 11.0*  --   CREATININE 1.27*  --   LATICACIDVEN  --  1.2    Estimated Creatinine Clearance: 71.1 mL/min (A) (by C-G formula based on SCr of 1.27 mg/dL (H)).    Allergies  Allergen Reactions   Levofloxacin Other (See Comments)    Psychosis   Tape Other (See Comments)    Welts, bad place on skin. Paper tape is okay   Wound Dressing Adhesive Hives    Welts, bad place on skin. Paper tape is okay   Hyoscyamine Sulfate Palpitations   Metoclopramide Other (See Comments)    Tremors     Antimicrobials this admission: Ceftriaxone  4/30 > Vancomycin  4/30 >  Microbiology results: 4/30 bcx:   Thank you for allowing pharmacy to be a part of this patient's care.  Fonda Hymen 05/02/2023 10:50 PM

## 2023-05-02 NOTE — Assessment & Plan Note (Signed)
 Blood pressure soft-systolic down to the 90s on my evaluation. -Hold Lasix  80 mg twice daily for tonight, resume pending clinical course -Hold Imdur , lisinopril  10 mg -Hold cardia XT 120 mg

## 2023-05-02 NOTE — Progress Notes (Signed)
 ED Pharmacy Antibiotic Sign Off An antibiotic consult was received from an ED provider for vancomycin per pharmacy dosing for cellulitis. A chart review was completed to assess appropriateness.   The following one time order(s) were placed:   ---vancomycin 2000 mg IV x 1  Further antibiotic and/or antibiotic pharmacy consults should be ordered by the admitting provider if indicated.   Thank you for allowing pharmacy to be a part of this patient's care.   Adalberto Acton, [facility]  Clinical Pharmacist 05/02/23 7:33 PM

## 2023-05-02 NOTE — Assessment & Plan Note (Signed)
 Potassium 6.3 likely iatrogenic.  She is been taking 200 mg of potassium daily. -Hold home potassium  - Lokelma 10g, Lasix  40 and 100 mL bolus, 5 units insulin  with 50 mL of D50 and albuterol  neb given -Repeat K

## 2023-05-02 NOTE — Assessment & Plan Note (Addendum)
 Resume tapentadol - Also resume Linzess 

## 2023-05-02 NOTE — Assessment & Plan Note (Signed)
 With erythema, swelling, pain to left lower extremity.  WBC 11.  Rules out for sepsis.  Lactic acid 1.2.  Failed outpatient therapy with Augmentin, open and weeping blister present.  Reports compliance with Xarelto . -IV ceftriaxone  2 g daily -Start vancomycin -Follow-up blood cultures -Left lower extremity venous Dopplers

## 2023-05-03 ENCOUNTER — Encounter (HOSPITAL_COMMUNITY): Payer: Self-pay | Admitting: Internal Medicine

## 2023-05-03 ENCOUNTER — Observation Stay (HOSPITAL_COMMUNITY)

## 2023-05-03 DIAGNOSIS — Z7985 Long-term (current) use of injectable non-insulin antidiabetic drugs: Secondary | ICD-10-CM | POA: Diagnosis not present

## 2023-05-03 DIAGNOSIS — Z90710 Acquired absence of both cervix and uterus: Secondary | ICD-10-CM | POA: Diagnosis not present

## 2023-05-03 DIAGNOSIS — F32A Depression, unspecified: Secondary | ICD-10-CM | POA: Diagnosis present

## 2023-05-03 DIAGNOSIS — Z7901 Long term (current) use of anticoagulants: Secondary | ICD-10-CM | POA: Diagnosis not present

## 2023-05-03 DIAGNOSIS — Z6841 Body Mass Index (BMI) 40.0 and over, adult: Secondary | ICD-10-CM | POA: Diagnosis not present

## 2023-05-03 DIAGNOSIS — G8929 Other chronic pain: Secondary | ICD-10-CM | POA: Diagnosis present

## 2023-05-03 DIAGNOSIS — E875 Hyperkalemia: Secondary | ICD-10-CM | POA: Diagnosis present

## 2023-05-03 DIAGNOSIS — L03116 Cellulitis of left lower limb: Secondary | ICD-10-CM | POA: Diagnosis present

## 2023-05-03 DIAGNOSIS — R939 Diagnostic imaging inconclusive due to excess body fat of patient: Secondary | ICD-10-CM | POA: Diagnosis not present

## 2023-05-03 DIAGNOSIS — Z7984 Long term (current) use of oral hypoglycemic drugs: Secondary | ICD-10-CM | POA: Diagnosis not present

## 2023-05-03 DIAGNOSIS — Z87891 Personal history of nicotine dependence: Secondary | ICD-10-CM | POA: Diagnosis not present

## 2023-05-03 DIAGNOSIS — Z8249 Family history of ischemic heart disease and other diseases of the circulatory system: Secondary | ICD-10-CM | POA: Diagnosis not present

## 2023-05-03 DIAGNOSIS — J449 Chronic obstructive pulmonary disease, unspecified: Secondary | ICD-10-CM | POA: Diagnosis present

## 2023-05-03 DIAGNOSIS — E1143 Type 2 diabetes mellitus with diabetic autonomic (poly)neuropathy: Secondary | ICD-10-CM | POA: Diagnosis present

## 2023-05-03 DIAGNOSIS — Z79899 Other long term (current) drug therapy: Secondary | ICD-10-CM | POA: Diagnosis not present

## 2023-05-03 DIAGNOSIS — I480 Paroxysmal atrial fibrillation: Secondary | ICD-10-CM | POA: Diagnosis present

## 2023-05-03 DIAGNOSIS — K3184 Gastroparesis: Secondary | ICD-10-CM | POA: Diagnosis present

## 2023-05-03 DIAGNOSIS — Z794 Long term (current) use of insulin: Secondary | ICD-10-CM | POA: Diagnosis not present

## 2023-05-03 DIAGNOSIS — N179 Acute kidney failure, unspecified: Secondary | ICD-10-CM | POA: Diagnosis present

## 2023-05-03 DIAGNOSIS — R339 Retention of urine, unspecified: Secondary | ICD-10-CM | POA: Diagnosis present

## 2023-05-03 DIAGNOSIS — I10 Essential (primary) hypertension: Secondary | ICD-10-CM | POA: Diagnosis present

## 2023-05-03 DIAGNOSIS — G4733 Obstructive sleep apnea (adult) (pediatric): Secondary | ICD-10-CM | POA: Diagnosis present

## 2023-05-03 DIAGNOSIS — Z823 Family history of stroke: Secondary | ICD-10-CM | POA: Diagnosis not present

## 2023-05-03 LAB — GLUCOSE, CAPILLARY
Glucose-Capillary: 111 mg/dL — ABNORMAL HIGH (ref 70–99)
Glucose-Capillary: 175 mg/dL — ABNORMAL HIGH (ref 70–99)
Glucose-Capillary: 102 mg/dL — ABNORMAL HIGH (ref 70–99)
Glucose-Capillary: 136 mg/dL — ABNORMAL HIGH (ref 70–99)

## 2023-05-03 LAB — BASIC METABOLIC PANEL WITH GFR
Anion gap: 9 (ref 5–15)
BUN: 27 mg/dL — ABNORMAL HIGH (ref 8–23)
CO2: 23 mmol/L (ref 22–32)
Calcium: 9.6 mg/dL (ref 8.9–10.3)
Chloride: 103 mmol/L (ref 98–111)
Creatinine, Ser: 0.99 mg/dL (ref 0.44–1.00)
GFR, Estimated: >60 mL/min
Glucose, Bld: 130 mg/dL — ABNORMAL HIGH (ref 70–99)
Potassium: 4.3 mmol/L (ref 3.5–5.1)
Sodium: 135 mmol/L (ref 135–145)

## 2023-05-03 LAB — CBC
HCT: 32.4 % — ABNORMAL LOW (ref 36.0–46.0)
Hemoglobin: 10.5 g/dL — ABNORMAL LOW (ref 12.0–15.0)
MCH: 29.2 pg (ref 26.0–34.0)
MCHC: 32.4 g/dL (ref 30.0–36.0)
MCV: 90 fL (ref 80.0–100.0)
Platelets: 323 10*3/uL (ref 150–400)
RBC: 3.6 MIL/uL — ABNORMAL LOW (ref 3.87–5.11)
RDW: 13.1 % (ref 11.5–15.5)
WBC: 12.2 10*3/uL — ABNORMAL HIGH (ref 4.0–10.5)
nRBC: 0 % (ref 0.0–0.2)

## 2023-05-03 LAB — HIV ANTIBODY (ROUTINE TESTING W REFLEX): HIV Screen 4th Generation wRfx: NONREACTIVE

## 2023-05-03 LAB — HEMOGLOBIN A1C
Hgb A1c MFr Bld: 5.8 % — ABNORMAL HIGH (ref 4.8–5.6)
Mean Plasma Glucose: 119.76 mg/dL

## 2023-05-03 LAB — POTASSIUM: Potassium: 4.6 mmol/L (ref 3.5–5.1)

## 2023-05-03 MED ORDER — INSULIN ASPART 100 UNIT/ML IJ SOLN: 0.0000 [IU] | Freq: Every day | INTRAMUSCULAR | Status: DC

## 2023-05-03 MED ORDER — ACETAMINOPHEN 650 MG RE SUPP: 650.0000 mg | Freq: Four times a day (QID) | RECTAL | Status: DC | PRN

## 2023-05-03 MED ORDER — ATORVASTATIN CALCIUM 40 MG PO TABS
40.0000 mg | ORAL_TABLET | Freq: Every day | ORAL | Status: DC
  Administered 2023-05-03 – 2023-05-04 (×2): 40 mg via ORAL
  Filled 2023-05-03 (×2): qty 1

## 2023-05-03 MED ORDER — LINACLOTIDE 145 MCG PO CAPS
290.0000 ug | ORAL_CAPSULE | Freq: Every day | ORAL | Status: DC
  Administered 2023-05-03 – 2023-05-04 (×2): 290 ug via ORAL
  Filled 2023-05-03 (×2): qty 2

## 2023-05-03 MED ORDER — APREMILAST 30 MG PO TABS: 1.0000 | ORAL_TABLET | Freq: Two times a day (BID) | ORAL | Status: DC

## 2023-05-03 MED ORDER — FLECAINIDE ACETATE 50 MG PO TABS
100.0000 mg | ORAL_TABLET | Freq: Two times a day (BID) | ORAL | Status: DC
  Administered 2023-05-03 – 2023-05-04 (×4): 100 mg via ORAL
  Filled 2023-05-03 (×4): qty 2

## 2023-05-03 MED ORDER — LORATADINE 10 MG PO TABS
10.0000 mg | ORAL_TABLET | Freq: Every day | ORAL | Status: DC
  Administered 2023-05-03 – 2023-05-04 (×2): 10 mg via ORAL
  Filled 2023-05-03 (×2): qty 1

## 2023-05-03 MED ORDER — FUROSEMIDE 40 MG PO TABS
80.0000 mg | ORAL_TABLET | Freq: Two times a day (BID) | ORAL | Status: DC
  Administered 2023-05-03 – 2023-05-04 (×3): 80 mg via ORAL
  Filled 2023-05-03 (×3): qty 2

## 2023-05-03 MED ORDER — PANTOPRAZOLE SODIUM 40 MG PO TBEC
40.0000 mg | DELAYED_RELEASE_TABLET | Freq: Every day | ORAL | Status: DC
  Administered 2023-05-03 – 2023-05-04 (×2): 40 mg via ORAL
  Filled 2023-05-03 (×2): qty 1

## 2023-05-03 MED ORDER — DILTIAZEM HCL ER COATED BEADS 120 MG PO CP24
120.0000 mg | ORAL_CAPSULE | Freq: Every day | ORAL | Status: DC
  Administered 2023-05-03 – 2023-05-04 (×2): 120 mg via ORAL
  Filled 2023-05-03 (×2): qty 1

## 2023-05-03 MED ORDER — INSULIN GLARGINE-YFGN 100 UNIT/ML ~~LOC~~ SOLN
15.0000 [IU] | Freq: Every day | SUBCUTANEOUS | Status: DC
  Administered 2023-05-03: 15 [IU] via SUBCUTANEOUS
  Filled 2023-05-03 (×2): qty 0.15

## 2023-05-03 MED ORDER — UMECLIDINIUM-VILANTEROL 62.5-25 MCG/ACT IN AEPB
1.0000 | INHALATION_SPRAY | Freq: Every day | RESPIRATORY_TRACT | Status: DC
  Administered 2023-05-03 – 2023-05-04 (×2): 1 via RESPIRATORY_TRACT
  Filled 2023-05-03: qty 14

## 2023-05-03 MED ORDER — PRUCALOPRIDE SUCCINATE 2 MG PO TABS: 1.0000 | ORAL_TABLET | Freq: Every day | ORAL | Status: DC

## 2023-05-03 MED ORDER — OXYCODONE HCL 5 MG PO TABS
5.0000 mg | ORAL_TABLET | ORAL | Status: DC | PRN
  Administered 2023-05-03 – 2023-05-04 (×5): 5 mg via ORAL
  Filled 2023-05-03 (×5): qty 1

## 2023-05-03 MED ORDER — GABAPENTIN 400 MG PO CAPS
800.0000 mg | ORAL_CAPSULE | Freq: Four times a day (QID) | ORAL | Status: DC
Start: 2023-05-03 — End: 2023-05-04
  Administered 2023-05-03 – 2023-05-04 (×5): 800 mg via ORAL
  Filled 2023-05-03 (×5): qty 2

## 2023-05-03 MED ORDER — INSULIN ASPART 100 UNIT/ML IJ SOLN
0.0000 [IU] | Freq: Three times a day (TID) | INTRAMUSCULAR | Status: DC
  Administered 2023-05-03: 3 [IU] via SUBCUTANEOUS
  Administered 2023-05-04: 2 [IU] via SUBCUTANEOUS

## 2023-05-03 MED ORDER — FLUTICASONE PROPIONATE 50 MCG/ACT NA SUSP
2.0000 | Freq: Every day | NASAL | Status: DC
  Administered 2023-05-03 – 2023-05-04 (×2): 2 via NASAL
  Filled 2023-05-03: qty 16

## 2023-05-03 MED ORDER — ONDANSETRON HCL 4 MG PO TABS: 4.0000 mg | ORAL_TABLET | Freq: Four times a day (QID) | ORAL | Status: DC | PRN

## 2023-05-03 MED ORDER — TAPENTADOL HCL ER 50 MG PO TB12
100.0000 mg | ORAL_TABLET | Freq: Two times a day (BID) | ORAL | Status: DC
  Administered 2023-05-03 – 2023-05-04 (×2): 100 mg via ORAL
  Filled 2023-05-03: qty 2

## 2023-05-03 MED ORDER — ACETAMINOPHEN 325 MG PO TABS: 650.0000 mg | ORAL_TABLET | Freq: Four times a day (QID) | ORAL | Status: DC | PRN

## 2023-05-03 MED ORDER — MECLIZINE HCL 12.5 MG PO TABS
25.0000 mg | ORAL_TABLET | Freq: Three times a day (TID) | ORAL | Status: DC
  Administered 2023-05-03 – 2023-05-04 (×4): 25 mg via ORAL
  Filled 2023-05-03 (×4): qty 2

## 2023-05-03 MED ORDER — BACLOFEN 10 MG PO TABS
20.0000 mg | ORAL_TABLET | Freq: Three times a day (TID) | ORAL | Status: DC
  Administered 2023-05-03 – 2023-05-04 (×4): 20 mg via ORAL
  Filled 2023-05-03 (×4): qty 2

## 2023-05-03 MED ORDER — ICOSAPENT ETHYL 1 G PO CAPS
1.0000 g | ORAL_CAPSULE | Freq: Four times a day (QID) | ORAL | Status: DC
  Administered 2023-05-03 – 2023-05-04 (×4): 1 g via ORAL
  Filled 2023-05-03 (×9): qty 1

## 2023-05-03 MED ORDER — VANCOMYCIN HCL 1500 MG/300ML IV SOLN
1500.0000 mg | INTRAVENOUS | Status: DC
  Administered 2023-05-03: 1500 mg via INTRAVENOUS
  Filled 2023-05-03: qty 300

## 2023-05-03 MED ORDER — ONDANSETRON HCL 4 MG/2ML IJ SOLN
4.0000 mg | Freq: Four times a day (QID) | INTRAMUSCULAR | Status: DC | PRN
  Administered 2023-05-03: 4 mg via INTRAVENOUS
  Filled 2023-05-03: qty 2

## 2023-05-03 MED ORDER — NYSTATIN 100000 UNIT/GM EX POWD
1.0000 g | Freq: Every day | CUTANEOUS | Status: DC
  Administered 2023-05-03 – 2023-05-04 (×2): 1 via TOPICAL
  Filled 2023-05-03: qty 15

## 2023-05-03 MED ORDER — OXYCODONE-ACETAMINOPHEN 10-325 MG PO TABS: 1.0000 | ORAL_TABLET | ORAL | Status: DC | PRN

## 2023-05-03 MED ORDER — POLYETHYLENE GLYCOL 3350 17 G PO PACK
17.0000 g | PACK | Freq: Every day | ORAL | Status: DC | PRN
  Administered 2023-05-04: 17 g via ORAL
  Filled 2023-05-03: qty 1

## 2023-05-03 MED ORDER — DOCUSATE SODIUM 100 MG PO CAPS
100.0000 mg | ORAL_CAPSULE | Freq: Two times a day (BID) | ORAL | Status: DC
  Administered 2023-05-03 – 2023-05-04 (×4): 100 mg via ORAL
  Filled 2023-05-03 (×4): qty 1

## 2023-05-03 MED ORDER — OXYCODONE-ACETAMINOPHEN 5-325 MG PO TABS
1.0000 | ORAL_TABLET | ORAL | Status: DC | PRN
  Administered 2023-05-03 – 2023-05-04 (×5): 1 via ORAL
  Filled 2023-05-03 (×5): qty 1

## 2023-05-03 MED ORDER — CHLORHEXIDINE GLUCONATE CLOTH 2 % EX PADS
6.0000 | MEDICATED_PAD | Freq: Every day | CUTANEOUS | Status: DC
  Administered 2023-05-03 – 2023-05-04 (×2): 6 via TOPICAL

## 2023-05-03 MED ORDER — RIVAROXABAN 20 MG PO TABS
20.0000 mg | ORAL_TABLET | Freq: Every day | ORAL | Status: DC
  Administered 2023-05-03 (×2): 20 mg via ORAL
  Filled 2023-05-03 (×2): qty 1

## 2023-05-03 NOTE — Hospital Course (Addendum)
 63 yof history significant for  COPD, HTN, diabetes mellitus, gastroparesis, atrial fibrillation, OSA, depression presented to the ED with complaints of lower extremity swelling worse on the left, with redness and pain despite completing Augmentin course as outpatient, initially redness improved but over the past few days,it returned, and she had an open wound that was weeping. Patient is compliant with her medications including Xarelto , and Lasix  80 twice daily.  She also takes 200 mg of potassium every day. In ED: afebrile temperature 98.  HR 72-79.  RR 16-18.BP 96-128. Labs-hyperkalemia potassium of 6.3.  WBC 11.  BNP 13.  Lactic acid 1.2. CXR>negative for acute abnormality and started on ceftriaxone  and admitted for LLE cellulitis.  Duplex negative for DVT.  Blood culture no growth so far.  Managed w/ ceftriaxone  vancomycin . She has clinically improved, she is eager to go home today, will transition to oral antibiotics and discharge home  Subjective: Seen examined this morning.  Left leg redness much improved swelling down.  Eager to go home today Overnight afebrile hemodynamically stable.  CPAP last night  Labs showed improved WBC count to 10 stable renal function  Discharge diagnosis:  Cellulitis of left lower extremity: P/w erythema swelling and open and weeping blisters and mild leukocytosis, recently completed antibiotic augmentin.redness swelling and pain slightly improving, duplex negative for DVT.ordered MRSA swab.  Blood culture no growth so far.  Doppler negative for DVT Managed with IV Ceftriaxone , vancomycin  and home Lasix  80 mg. Redness much improved swelling better, plan to discharge home on cefadroxil 1 g twice daily ( due to her weight) for 6 more days after discussion with ID pharmacy  Hyperkalemia: 2/2 potassium replacement. Improved after lokelma .  Advised to monitor BMP while on Aldactone  diuretics and other meds at home holding potassium supplement upon discharge.  Chronic  pain: Cont tapentadol  and Linzess    DM:  A1c 5.8 blood sugar remains well-controlled on SSI,home insulin  Toujeo  at reduced dose 15 units nightly (home dose 43 units) resume home meds upon discharge but monitor sugar closely  Recent Labs  Lab 05/02/23 2109 05/03/23 0733 05/03/23 1120 05/03/23 1659 05/03/23 2123 05/04/23 0743  GLUCAP  --  111* 175* 102* 136* 121*  HGBA1C 5.8*  --   --   --   --   --    Incomplete bladder emptying ISC at home for 9 years ,f/b Dr Dulcy Gibney, for now continue foley  COPD Stable. Continue inhalers  Essential HTN: BP stable.  was soft on admit-resume home meds   PAF: HR stable in sinus continue Xarelto  flecainide  and Cardizem    Obstructive sleep apnea Resume home BiPAP nightly    Polypharmacy: Monitor

## 2023-05-03 NOTE — Plan of Care (Signed)
  Problem: Education: Goal: Knowledge of General Education information will improve Description: Including pain rating scale, medication(s)/side effects and non-pharmacologic comfort measures Outcome: Progressing   Problem: Clinical Measurements: Goal: Ability to maintain clinical measurements within normal limits will improve Outcome: Progressing Goal: Diagnostic test results will improve Outcome: Progressing   Problem: Skin Integrity: Goal: Skin integrity will improve Outcome: Progressing

## 2023-05-03 NOTE — Plan of Care (Signed)

## 2023-05-03 NOTE — Progress Notes (Signed)
 PROGRESS NOTE [name] [name]  ZOX:[id number] DOB: 05/10/1960 DOA: 05/02/2023 PCP: Orlena Bitters, MD  Brief Narrative/Hospital Course: 60 yof history significant for  COPD, HTN, diabetes mellitus, gastroparesis, atrial fibrillation, OSA, depression presented to the ED with complaints of lower extremity swelling worse on the left, with redness and pain despite completing Augmentin course as outpatient, initially redness improved but over the past few days,it returned, and she had an open wound that was weeping. Patient is compliant with her medications including Xarelto , and Lasix  80 twice daily.  She also takes 200 mg of potassium every day. In ED: afebrile temperature 98.  HR 72-79.  RR 16-18.BP 96-128. Labs-hyperkalemia potassium of 6.3.  WBC 11.  BNP 13.  Lactic acid 1.2. CXR>negative for acute abnormality and started on ceftriaxone  and admitted for LLE cellulitis.  Subjective: Seen and examined Family at bedside Feels cold had nausea earlier No fever vomiting or abd pain Overnight afebrile BP stable Labs stable BMP mild leukocytosis on CBC  Assessment and plan:  Cellulitis of left lower extremity: P/w erythema swelling and open and weeping blisters and mild leukocytosis, recently completed antibiotic augmentin. Today redness swelling and pain slightly better. Continue ceftriaxone  vancomycin  follow-up blood culture. Follow Doppler of the lower extremity no DVT already on Xarelto  Resume Lasix  80mg   as blood pressure tolerates  Hyperkalemia: 2/2 potassium replacement. Improved after lokelma .  Chronic pain: Cont tapentadol  and Linzess    DM:  Follow-up A1c continue SSI,home insulin  Toujeo  at reduced dose 15 units nightly (dose 43 units) holding home glimepiride metformin  Ozempic Recent Labs  Lab 05/03/23 0733  GLUCAP 111*   Incomplete bladder emptying ISC at home for 9 years ,f/b Dr Dulcy Gibney, for now con foley  COPD Stable. Continue inhalers  Essential HTN: BP is  well-controlled.  On admit soft so held Imdur , lisinopril .Resume Lasix   Paroxysmal atrial fibrillation: HR stable in sinus continue Xarelto  flecainide  and Cardizem    Obstructive sleep apnea Resume home BiPAP nightly    Polypharmacy: Monitor  Morbid Obesity w/ Body mass index is 48.91 kg/m.: OSA not using CPAP Will benefit with PCP follow-up, weight loss,healthy lifestyle   DVT prophylaxis: xarelto  Code Status:   Code Status: Full Code Family Communication: plan of care discussed with patient/son at bedside. Patient status is: Remains hospitalized because of severity of illness Level of care: Telemetry   Dispo: The patient is from: home            Anticipated disposition: TBD Objective: Vitals last 24 hrs: Vitals:   05/03/23 0100 05/03/23 0137 05/03/23 0147 05/03/23 0511  BP:  (!) 128/44  (!) 119/49  Pulse: 89 88  84  Resp: 16 20  18   Temp:  98.4 F (36.9 C)  98.6 F (37 C)  TempSrc:  Oral    SpO2: 93% 100%  93%  Weight:   (!) 145.9 kg   Height:   5\' 8"  (1.727 m)     Physical Examination: General exam: alert awake, older than stated age HEENT:Oral mucosa moist, Ear/Nose WNL grossly Respiratory system: B.L diminished  BS, no use of accessory muscle Cardiovascular system: S1 & S2 +. Gastrointestinal system: Abdomen soft, NT,ND,BS+ Nervous System: Alert, awake,  following commands. Extremities: LE edema neg, LLE red swollen, tender  Skin: No rashes,warm. MSK: Normal muscle bulk/tone.   Data Reviewed: I have personally reviewed following labs and imaging studies ( see epic result tab) CBC: Recent Labs  Lab 05/02/23 1744 05/03/23 0426  WBC 11.0* 12.2*  HGB 10.1* 10.5*  HCT  32.6* 32.4*  MCV 90.6 90.0  PLT 338 323   CMP: Recent Labs  Lab 05/02/23 1744 05/02/23 2109 05/03/23 0426  NA 132*  --  135  K 6.3* 4.6 4.3  CL 104  --  103  CO2 20*  --  23  GLUCOSE 168*  --  130*  BUN 30*  --  27*  CREATININE 1.27*  --  0.99  CALCIUM  9.8  --  9.6   GFR:  Estimated Creatinine Clearance: 89.9 mL/min (by C-G formula based on SCr of 0.99 mg/dL). Recent Labs  Lab 05/02/23 1928  AST 20  ALT 13  ALKPHOS 70  BILITOT 0.3  PROT 7.1  ALBUMIN 3.7   No results for input(s): "LIPASE", "AMYLASE" in the last 168 hours. No results for input(s): "AMMONIA" in the last 168 hours. Coagulation Profile:  Recent Labs  Lab 05/02/23 1928  INR 1.1   Unresulted Labs (From admission, onward)     Start     Ordered   05/04/23 0500  Basic metabolic panel with GFR  Daily,   R      05/03/23 0819   05/04/23 0500  CBC  Daily,   R      05/03/23 0819   05/03/23 0145  Hemoglobin A1c  Once,   R        05/03/23 0145           Antimicrobials/Microbiology: Anti-infectives (From admission, onward)    Start     Dose/Rate Route Frequency Ordered Stop   05/03/23 2200  vancomycin  (VANCOREADY) IVPB 1500 mg/300 mL        1,500 mg 150 mL/hr over 120 Minutes Intravenous Every 24 hours 05/03/23 0056 05/09/23 2159   05/03/23 1900  cefTRIAXone  (ROCEPHIN ) 2 g in sodium chloride  0.9 % 100 mL IVPB        2 g 200 mL/hr over 30 Minutes Intravenous Every 24 hours 05/02/23 2250     05/02/23 2300  vancomycin  (VANCOCIN ) IVPB 1000 mg/200 mL premix  Status:  Discontinued        1,000 mg 200 mL/hr over 60 Minutes Intravenous  Once 05/02/23 2249 05/02/23 2250   05/02/23 2245  cefTRIAXone  (ROCEPHIN ) 1 g in sodium chloride  0.9 % 100 mL IVPB       Note to Pharmacy: Dose adjusted for weight, totaling 2 g.   1 g 200 mL/hr over 30 Minutes Intravenous  Once 05/02/23 2238 05/03/23 0048   05/02/23 1945  vancomycin  (VANCOREADY) IVPB 2000 mg/400 mL        2,000 mg 200 mL/hr over 120 Minutes Intravenous  Once 05/02/23 1935 05/03/23 0011   05/02/23 1930  cefTRIAXone  (ROCEPHIN ) 1 g in sodium chloride  0.9 % 100 mL IVPB        1 g 200 mL/hr over 30 Minutes Intravenous  Once 05/02/23 1925 05/02/23 2152         Component Value Date/Time   SDES BLOOD LEFT ANTECUBITAL 05/02/2023 1928   SDES  BLOOD RIGHT ANTECUBITAL 05/02/2023 1928   SPECREQUEST  05/02/2023 1928    BOTTLES DRAWN AEROBIC AND ANAEROBIC Blood Culture adequate volume   SPECREQUEST  05/02/2023 1928    BOTTLES DRAWN AEROBIC AND ANAEROBIC Blood Culture adequate volume   CULT  05/02/2023 1928    NO GROWTH < 12 HOURS Performed at [facility], [address], [place], [place] [address]    CULT  05/02/2023 1928    NO GROWTH < 12 HOURS Performed at [facility], [address],  [place], [place] [address]    REPTSTATUS PENDING 05/02/2023 1928   REPTSTATUS PENDING 05/02/2023 1928    Medications reviewed:  Scheduled Meds:  Apremilast   1 tablet Oral BID   atorvastatin   40 mg Oral Daily   baclofen   20 mg Oral TID   Chlorhexidine  Gluconate Cloth  6 each Topical Q0600   diltiazem   120 mg Oral Daily   docusate sodium   100 mg Oral BID   flecainide   100 mg Oral Q12H   fluticasone   2 spray Each Nare Daily   furosemide   80 mg Oral BID   gabapentin   800 mg Oral QID   icosapent  Ethyl  1 g Oral QID   insulin  aspart  0-15 Units Subcutaneous TID WC   insulin  aspart  0-5 Units Subcutaneous QHS   insulin  glargine-yfgn  15 Units Subcutaneous QHS   linaclotide   290 mcg Oral QAC breakfast   loratadine   10 mg Oral Daily   meclizine   25 mg Oral TID   nystatin   1 g Topical Daily   pantoprazole   40 mg Oral Daily   Prucalopride Succinate   1 tablet Oral Daily   rivaroxaban   20 mg Oral Q supper   tapentadol   100 mg Oral BID   umeclidinium-vilanterol  1 puff Inhalation Daily  Continuous Infusions:  cefTRIAXone  (ROCEPHIN )  IV     vancomycin     Lesa Rape, MD [facility] 05/03/2023, 11:00 AM

## 2023-05-03 NOTE — TOC Initial Note (Signed)
 Transition of Care [facility]) - Initial/Assessment Note    Patient Details  Name: [name] [name] MRN: [id number] Date of Birth: 09/29/1960  Transition of Care [facility]) CM/SW Contact:    Juanda Noon Phone Number: 05/03/2023, 8:22 AM  Clinical Narrative:                  CSW assessed patient. Son Fabio Holts at bedside. 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 Patient has a WC, Power motorized chair, and a walker. TOC to follow.    Expected Discharge Plan: Home/Self Care Barriers to Discharge: Continued Medical Work up   Patient Goals and CMS Choice Patient states their goals for this hospitalization and ongoing recovery are:: return back home CMS Medicare.gov Compare Post Acute Care list provided to:: Patient Represenative (must comment) (Son - Fabio Holts)        Expected Discharge Plan and Services In-house Referral: Clinical Social Work Discharge Planning Services: CM Consult Post Acute Care Choice: Durable Medical Equipment (CNA Monday - Friday 9am-5pm) Living arrangements for the past 2 months: Single Family Home                                      Prior Living Arrangements/Services Living arrangements for the past 2 months: Single Family Home Lives with:: Self Patient language and need for interpreter reviewed:: Yes Do you feel safe going back to the place where you live?: Yes      Need for Family Participation in Patient Care: Yes (Comment) Care giver support system in place?: Yes (comment) Current home services: DME, Homehealth aide Criminal Activity/Legal Involvement Pertinent to Current Situation/Hospitalization: No - Comment as needed  Activities of Daily Living   ADL Screening (condition at time of admission) Independently performs ADLs?: Yes (appropriate for developmental age) Is the patient deaf or have difficulty hearing?: No Does the patient have difficulty seeing, even  when wearing glasses/contacts?: No Does the patient have difficulty concentrating, remembering, or making decisions?: No  Permission Sought/Granted   Permission granted to share information with : Yes, Verbal Permission Granted  Share Information with NAME: Fabio Holts     Permission granted to share info w Relationship: Son     Emotional Assessment Appearance:: Appears stated age Attitude/Demeanor/Rapport: Engaged Affect (typically observed): Accepting Orientation: : Oriented to Self, Oriented to Place, Oriented to  Time, Oriented to Situation Alcohol  / Substance Use: Not Applicable Psych Involvement: No (comment)  Admission diagnosis:  Hyperkalemia [E87.5] Cellulitis of left lower extremity [L03.116] AKI (acute kidney injury) (HCC) [N17.9] Patient Active Problem List   Diagnosis Date Noted   Hyperkalemia 05/02/2023   Cellulitis of left lower extremity 05/02/2023   DM (diabetes mellitus) (HCC) 05/02/2023   Chronic pain 05/02/2023   Polypharmacy 05/02/2023   Dyspnea on exertion    Therapeutic opioid-induced constipation (OIC) 01/30/2018   Diarrhea 10/24/2017   Taking multiple medications for chronic disease 11/01/2016   History of adenomatous polyp of colon 11/01/2016   Chest pain 09/15/2015   Fibromyalgia    Chronic obstructive pulmonary disease (HCC)    Constipation 08/16/2015   Nausea without vomiting 03/25/2015   GERD (gastroesophageal reflux disease) 03/25/2015   Depression 01/05/2015   Cervical spondylosis with radiculopathy 12/02/2014   Rectal bleeding 08/22/2013   History of melena 06/05/2013   Irritable bowel syndrome 06/05/2013   Precordial pain  02/06/2012   Paroxysmal atrial fibrillation (HCC) 09/28/2011   Essential hypertension, benign 09/28/2011   Lymphadenopathy 08/17/2011   Leukocytosis 08/04/2011   Retroperitoneal lymphadenopathy 08/04/2011   Hematuria 07/13/2011   Dyspareunia in female 01/12/2011   Frequency of urination and polyuria 01/12/2011    Incomplete emptying of bladder 01/12/2011   Obstructive sleep apnea 10/02/2007   Gastroparesis due to DM (HCC) 10/02/2007   PCP:  Orlena Bitters, MD Pharmacy:   [facility] - [place], [place] - [address] [place] [address] Phone: [phone] Fax: [phone]     Social Drivers of Health (SDOH) Social History: SDOH Screenings   Food Insecurity: No Food Insecurity (05/03/2023)  Housing: Low Risk  (05/03/2023)  Transportation Needs: No Transportation Needs (05/03/2023)  Utilities: Not At Risk (05/03/2023)  Financial Resource Strain: Low Risk  (09/22/2022)  Physical Activity: Inactive (08/18/2022)  Social Connections: Patient Declined (05/03/2023)  Tobacco Use: Medium Risk (05/03/2023)  Health Literacy: Adequate Health Literacy (08/18/2022)   SDOH Interventions:     Readmission Risk Interventions     No data to display

## 2023-05-04 DIAGNOSIS — L03116 Cellulitis of left lower limb: Secondary | ICD-10-CM | POA: Diagnosis not present

## 2023-05-04 LAB — BASIC METABOLIC PANEL WITH GFR
Anion gap: 14 (ref 5–15)
BUN: 26 mg/dL — ABNORMAL HIGH (ref 8–23)
CO2: 26 mmol/L (ref 22–32)
Calcium: 9.9 mg/dL (ref 8.9–10.3)
Chloride: 96 mmol/L — ABNORMAL LOW (ref 98–111)
Creatinine, Ser: 0.9 mg/dL (ref 0.44–1.00)
GFR, Estimated: >60 mL/min (ref >60)
Glucose, Bld: 114 mg/dL — ABNORMAL HIGH (ref 70–99)
Potassium: 3.5 mmol/L (ref 3.5–5.1)
Sodium: 136 mmol/L (ref 135–145)

## 2023-05-04 LAB — CBC
HCT: 33.9 % — ABNORMAL LOW (ref 36.0–46.0)
Hemoglobin: 11.1 g/dL — ABNORMAL LOW (ref 12.0–15.0)
MCH: 29.2 pg (ref 26.0–34.0)
MCHC: 32.7 g/dL (ref 30.0–36.0)
MCV: 89.2 fL (ref 80.0–100.0)
Platelets: 302 10*3/uL (ref 150–400)
RBC: 3.8 MIL/uL — ABNORMAL LOW (ref 3.87–5.11)
RDW: 12.9 % (ref 11.5–15.5)
WBC: 10.1 10*3/uL (ref 4.0–10.5)
nRBC: 0 % (ref 0.0–0.2)

## 2023-05-04 LAB — MRSA NEXT GEN BY PCR, NASAL: MRSA by PCR Next Gen: NOT DETECTED

## 2023-05-04 LAB — GLUCOSE, CAPILLARY
Glucose-Capillary: 121 mg/dL — ABNORMAL HIGH (ref 70–99)
Glucose-Capillary: 187 mg/dL — ABNORMAL HIGH (ref 70–99)

## 2023-05-04 MED ORDER — CEFADROXIL 500 MG PO CAPS
1000.0000 mg | ORAL_CAPSULE | Freq: Two times a day (BID) | ORAL | 0 refills | Status: AC
Start: 2023-05-04 — End: 2023-05-10

## 2023-05-04 NOTE — Discharge Summary (Signed)
 Physician Discharge Summary  [name] [name] ZOX:[id number] DOB: [birth year] DOA: 05/02/2023  PCP: Orlena Bitters, MD  Admit date: 05/02/2023 Discharge date: 05/04/2023 Recommendations for Outpatient Follow-up:  Follow up with PCP in 1 weeks-call for appointment Please obtain BMP/CBC in one week  Discharge Dispo: home Discharge Condition: Stable Code Status:   Code Status: Full Code Diet recommendation:  Diet Order             Diet heart healthy/carb modified Fluid consistency: Thin  Diet effective now                    Brief/Interim Summary: 63 yof history significant for  COPD, HTN, diabetes mellitus, gastroparesis, atrial fibrillation, OSA, depression presented to the ED with complaints of lower extremity swelling worse on the left, with redness and pain despite completing Augmentin course as outpatient, initially redness improved but over the past few days,it returned, and she had an open wound that was weeping. Patient is compliant with her medications including Xarelto , and Lasix  80 twice daily.  She also takes 200 mg of potassium every day. In ED: afebrile temperature 98.  HR 72-79.  RR 16-18.BP 96-128. Labs-hyperkalemia potassium of 6.3.  WBC 11.  BNP 13.  Lactic acid 1.2. CXR>negative for acute abnormality and started on ceftriaxone  and admitted for LLE cellulitis.  Duplex negative for DVT.  Blood culture no growth so far.  Managed w/ ceftriaxone  vancomycin . She has clinically improved, she is eager to go home today, will transition to oral antibiotics and discharge home  Subjective: Seen examined this morning.  Left leg redness much improved swelling down.  Eager to go home today Overnight afebrile hemodynamically stable.  CPAP last night  Labs showed improved WBC count to 10 stable renal function  Discharge diagnosis:  Cellulitis of left lower extremity: P/w erythema swelling and open and weeping blisters and mild leukocytosis, recently completed antibiotic  augmentin.redness swelling and pain slightly improving, duplex negative for DVT.ordered MRSA swab.  Blood culture no growth so far.  Doppler negative for DVT Managed with IV Ceftriaxone , vancomycin  and home Lasix  80 mg. Redness much improved swelling better, plan to discharge home on cefadroxil 1 g twice daily ( due to her weight) for 6 more days after discussion with [facility] pharmacy  Hyperkalemia: 2/2 potassium replacement. Improved after lokelma .  Advised to monitor BMP while on Aldactone  diuretics and other meds at home holding potassium supplement upon discharge.  Chronic pain: Cont tapentadol  and Linzess    DM:  A1c 5.8 blood sugar remains well-controlled on SSI,home insulin  Toujeo  at reduced dose 15 units nightly (home dose 43 units) resume home meds upon discharge but monitor sugar closely  Recent Labs  Lab 05/02/23 2109 05/03/23 0733 05/03/23 1120 05/03/23 1659 05/03/23 2123 05/04/23 0743  GLUCAP  --  111* 175* 102* 136* 121*  HGBA1C 5.8*  --   --   --   --   --    Incomplete bladder emptying ISC at home for 9 years ,f/b Dr Dulcy Gibney, for now continue foley  COPD Stable. Continue inhalers  Essential HTN: BP stable.  was soft on admit-resume home meds   PAF: HR stable in sinus continue Xarelto  flecainide  and Cardizem    Obstructive sleep apnea Resume home BiPAP nightly    Polypharmacy: Monitor   Consult consultation: ID pharmacy  Discharge Exam: Vitals:   05/03/23 2019 05/04/23 0440  BP: (!) 101/53 (!) 116/46  Pulse: 61 65  Resp: 18 14  Temp: 99.4 F (37.4 C)  97.6 F (36.4 C)  SpO2: 99% 95%   General: Pt is alert, awake, not in acute distress Cardiovascular: RRR, S1/S2 +, no rubs, no gallops Respiratory: CTA bilaterally, no wheezing, no rhonchi Abdominal: Soft, NT, ND, bowel sounds + Extremities: no edema, no cyanosis  Discharge Instructions  Discharge Instructions     Discharge instructions   Complete by: As directed    Please call call MD or return  to ER for similar or worsening recurring problem that brought you to hospital or if any fever,nausea/vomiting,abdominal pain, uncontrolled pain, chest pain,  shortness of breath or any other alarming symptoms.  Please follow-up your doctor as instructed in a week time and call the office for appointment.  Please avoid alcohol , smoking, or any other illicit substance and maintain healthy habits including taking your regular medications as prescribed.  You were cared for by a hospitalist during your hospital stay. If you have any questions about your discharge medications or the care you received while you were in the hospital after you are discharged, you can call the unit and ask to speak with the hospitalist on call if the hospitalist that took care of you is not available.  Once you are discharged, your primary care physician will handle any further medical issues. Please note that NO REFILLS for any discharge medications will be authorized once you are discharged, as it is imperative that you return to your primary care physician (or establish a relationship with a primary care physician if you do not have one) for your aftercare needs so that they can reassess your need for medications and monitor your lab values   Increase activity slowly   Complete by: As directed       Allergies as of 05/04/2023       Reactions   Levofloxacin Other (See Comments)   Psychosis   Tape Other (See Comments)   Welts, bad place on skin. Paper tape is okay   Wound Dressing Adhesive Hives   Welts, bad place on skin. Paper tape is okay   Hyoscyamine Sulfate Palpitations   Metoclopramide Other (See Comments)   Tremors         Medication List     STOP taking these medications    potassium chloride  SA 20 MEQ tablet Commonly known as: KLOR-CON  M       TAKE these medications    Anoro Ellipta  62.5-25 MCG/ACT Aepb Generic drug: umeclidinium-vilanterol Inhale 1 puff into the lungs daily.    atorvastatin  40 MG tablet Commonly known as: LIPITOR Take 40 mg by mouth daily.   baclofen  20 MG tablet Commonly known as: LIORESAL  Take 20 mg by mouth 3 (three) times daily.   Cartia  XT 120 MG 24 hr capsule Generic drug: diltiazem  TAKE ONE CAPSULE BY MOUTH EVERY DAY   cefadroxil 500 MG capsule Commonly known as: DURICEF Take 2 capsules (1,000 mg total) by mouth 2 (two) times daily for 6 days.   Dexilant  60 MG capsule Generic drug: dexlansoprazole  TAKE ONE CAPSULE BY MOUTH EVERY DAY   dexlansoprazole  60 MG capsule Commonly known as: DEXILANT  Take 1 capsule (60 mg total) by mouth daily.   diclofenac sodium 1 % Gel Commonly known as: VOLTAREN Apply 2-4 g topically 4 (four) times daily as needed (for pain).   docusate sodium  100 MG capsule Commonly known as: COLACE Take 100 mg by mouth 2 (two) times daily.   ferrous sulfate 325 (65 FE) MG EC tablet Take 325 mg by mouth daily with breakfast.  flecainide  50 MG tablet Commonly known as: TAMBOCOR  TAKE 2 TABLETS BY MOUTH EVERY MORNING and TAKE 2 TABLETS BY MOUTH EVERY EVENING   fluticasone  50 MCG/ACT nasal spray Commonly known as: FLONASE  INSTILL 2 SPRAYS IN EACH NOSTRIL EVERY DAY   furosemide  80 MG tablet Commonly known as: LASIX  Take 80 mg by mouth 2 (two) times daily.   gabapentin  800 MG tablet Commonly known as: NEURONTIN  Take 800 mg by mouth 4 (four) times daily.   glimepiride 4 MG tablet Commonly known as: AMARYL Take 4 mg by mouth 2 (two) times daily.   hydrocortisone  2.5 % rectal cream Commonly known as: ANUSOL -HC Place 1 Application rectally 2 (two) times daily. For rectal bleeding   isosorbide  mononitrate 120 MG 24 hr tablet Commonly known as: IMDUR  Take 120 mg by mouth daily. In the morning   isosorbide  mononitrate 30 MG 24 hr tablet Commonly known as: IMDUR  Take 30 mg by mouth every evening.   linaclotide  290 MCG Caps capsule Commonly known as: Linzess  Take 1 capsule (290 mcg total) by mouth  daily before breakfast.   lisinopril  10 MG tablet Commonly known as: ZESTRIL  Take 10 mg by mouth every evening.   loratadine  10 MG tablet Commonly known as: CLARITIN  Take 1 tablet (10 mg total) by mouth daily.   meclizine  25 MG tablet Commonly known as: ANTIVERT  Take 25 mg by mouth 3 (three) times daily.   metFORMIN  500 MG tablet Commonly known as: GLUCOPHAGE  Take 500-1,000 mg by mouth 2 (two) times daily. Take 1,000mg  in the Morning and 500 mg in the evening   metolazone 2.5 MG tablet Commonly known as: ZAROXOLYN Take 5 mg by mouth daily with breakfast.   Motegrity  2 MG Tabs Generic drug: Prucalopride Succinate  Take 1 tablet (2 mg total) by mouth daily.   multivitamin with minerals Tabs tablet Take 1 tablet by mouth daily.   mupirocin ointment 2 % Commonly known as: BACTROBAN Apply 1 Application topically in the morning and at bedtime.   Myrbetriq 50 MG Tb24 tablet Generic drug: mirabegron ER Take 50 mg by mouth daily.   nitroGLYCERIN  0.4 MG/SPRAY spray Commonly known as: NITROLINGUAL  USE 1 SPRAY UNDER THE TONGUE EVERY 5 MINUTES UP TO 3 DOSES AS NEEDED FOR CHEST PAIN. GO TO ER IF NOT RESOLVED   Nucynta  ER 100 MG 12 hr tablet Generic drug: tapentadol  Take 100 mg by mouth 2 (two) times daily.   nystatin  powder Generic drug: nystatin  Apply 1 g topically daily.   ondansetron  8 MG disintegrating tablet Commonly known as: ZOFRAN -ODT Take 8 mg by mouth daily.   Otezla  30 MG Tabs Generic drug: Apremilast  Take 1 tablet by mouth 2 (two) times daily.   oxyCODONE -acetaminophen  10-325 MG tablet Commonly known as: PERCOCET Take 1 tablet by mouth every 4 (four) hours as needed for pain.   OXYGEN  Inhale 2 L into the lungs as needed (shortness of breath).   Ozempic (1 MG/DOSE) 4 MG/3ML Sopn Generic drug: Semaglutide (1 MG/DOSE) Inject 1 mg into the skin every Tuesday.   rivaroxaban  20 MG Tabs tablet Commonly known as: XARELTO  Take 20 mg by mouth daily with  supper.   sodium chloride  0.65 % Soln nasal spray Commonly known as: OCEAN Place 1 spray into both nostrils daily.   spironolactone  50 MG tablet Commonly known as: ALDACTONE  Take 50 mg by mouth 3 (three) times daily.   Toujeo  SoloStar 300 UNIT/ML Solostar Pen Generic drug: insulin  glargine (1 Unit Dial) Inject 43 Units into the skin at bedtime.  Vascepa  1 g capsule Generic drug: icosapent  Ethyl Take 1 g by mouth 4 (four) times daily.        Follow-up Information     Vyas, Dhruv B, MD Follow up in 1 week(s).   Specialty: Internal Medicine Contact information: [address] [place] [address] [phone]                Allergies  Allergen Reactions   Levofloxacin Other (See Comments)    Psychosis   Tape Other (See Comments)    Welts, bad place on skin. Paper tape is okay   Wound Dressing Adhesive Hives    Welts, bad place on skin. Paper tape is okay   Hyoscyamine Sulfate Palpitations   Metoclopramide Other (See Comments)    Tremors     The results of significant diagnostics from this hospitalization (including imaging, microbiology, ancillary and laboratory) are listed below for reference.    Microbiology: Recent Results (from the past 240 hours)  Blood Culture (routine x 2)     Status: None (Preliminary result)   Collection Time: 05/02/23  7:28 PM   Specimen: BLOOD  Result Value Ref Range Status   Specimen Description BLOOD LEFT ANTECUBITAL  Final   Special Requests   Final    BOTTLES DRAWN AEROBIC AND ANAEROBIC Blood Culture adequate volume   Culture   Final    NO GROWTH 2 DAYS Performed at [facility], [address] [place], [place] [address]    Report Status PENDING  Incomplete  Blood Culture (routine x 2)     Status: None (Preliminary result)   Collection Time: 05/02/23  7:28 PM   Specimen: BLOOD  Result Value Ref Range Status   Specimen Description BLOOD RIGHT ANTECUBITAL  Final   Special Requests   Final    BOTTLES DRAWN AEROBIC AND  ANAEROBIC Blood Culture adequate volume   Culture   Final    NO GROWTH 2 DAYS Performed at [facility], [address], [place], [place] [address]    Report Status PENDING  Incomplete    Procedures/Studies: US  Venous Img Lower Unilateral Left (DVT) Result Date: 05/03/2023 CLINICAL DATA:  Left lower extremity cellulitis remote left GSV ablation 2020 EXAM: LEFT LOWER EXTREMITY VENOUS DOPPLER ULTRASOUND TECHNIQUE: Gray-scale sonography with graded compression, as well as color Doppler and duplex ultrasound were performed to evaluate the lower extremity deep venous systems from the level of the common femoral vein and including the common femoral, femoral, profunda femoral, popliteal and calf veins including the posterior tibial, peroneal and gastrocnemius veins when visible. The superficial great saphenous vein was also interrogated. Spectral Doppler was utilized to evaluate flow at rest and with distal augmentation maneuvers in the common femoral, femoral and popliteal veins. COMPARISON:  None Available. FINDINGS: Contralateral Common Femoral Vein: Respiratory phasicity is normal and symmetric with the symptomatic side. No evidence of thrombus. Normal compressibility. Common Femoral Vein: No evidence of thrombus. Normal compressibility, respiratory phasicity and response to augmentation. Saphenofemoral Junction: No evidence of thrombus. Normal compressibility and flow on color Doppler imaging. Profunda Femoral Vein: No evidence of thrombus. Normal compressibility and flow on color Doppler imaging. Femoral Vein: No evidence of thrombus. Normal compressibility, respiratory phasicity and response to augmentation. Popliteal Vein: No evidence of thrombus. Normal compressibility, respiratory phasicity and response to augmentation. Calf Veins: No evidence of thrombus. Normal compressibility and flow on color Doppler imaging. Limited exam because of obese body habitus. IMPRESSION: No evidence of significant left lower  extremity DVT by ultrasound.  Limited exam. Electronically Signed   By: Melven Stable.  Shick M.D.   On: 05/03/2023 14:33   DG Chest 2 View Result Date: 05/02/2023 CLINICAL DATA:  Shortness of breath.  Lower extremity edema. EXAM: CHEST - 2 VIEW COMPARISON:  Chest radiograph dated 03/20/2023 FINDINGS: No focal consolidation, pleural effusion, or pneumothorax. The cardiac silhouette is within normal limits. No acute osseous pathology. IMPRESSION: No active cardiopulmonary disease. Electronically Signed   By: Angus Bark M.D.   On: 05/02/2023 17:22    Labs: BNP (last 3 results) Recent Labs    05/02/23 1744  BNP 13.0   Basic Metabolic Panel: Recent Labs  Lab 05/02/23 1744 05/02/23 2109 05/03/23 0426 05/04/23 0436  NA 132*  --  135 136  K 6.3* 4.6 4.3 3.5  CL 104  --  103 96*  CO2 20*  --  23 26  GLUCOSE 168*  --  130* 114*  BUN 30*  --  27* 26*  CREATININE 1.27*  --  0.99 0.90  CALCIUM  9.8  --  9.6 9.9   Liver Function Tests: Recent Labs  Lab 05/02/23 1928  AST 20  ALT 13  ALKPHOS 70  BILITOT 0.3  PROT 7.1  ALBUMIN 3.7   No results for input(s): "LIPASE", "AMYLASE" in the last 168 hours. No results for input(s): "AMMONIA" in the last 168 hours. CBC: Recent Labs  Lab 05/02/23 1744 05/03/23 0426 05/04/23 0436  WBC 11.0* 12.2* 10.1  HGB 10.1* 10.5* 11.1*  HCT 32.6* 32.4* 33.9*  MCV 90.6 90.0 89.2  PLT 338 323 302   Cardiac Enzymes: No results for input(s): "CKTOTAL", "CKMB", "CKMBINDEX", "TROPONINI" in the last 168 hours. BNP: Invalid input(s): "POCBNP" CBG: Recent Labs  Lab 05/03/23 0733 05/03/23 1120 05/03/23 1659 05/03/23 2123 05/04/23 0743  GLUCAP 111* 175* 102* 136* 121*   Recent Labs    05/02/23 2109  HGBA1C 5.8*  Urinalysis    Component Value Date/Time   COLORURINE STRAW (A) 05/02/2023 2026   APPEARANCEUR CLEAR 05/02/2023 2026   LABSPEC 1.010 05/02/2023 2026   PHURINE 7.0 05/02/2023 2026   GLUCOSEU NEGATIVE 05/02/2023 2026   HGBUR NEGATIVE  05/02/2023 2026   BILIRUBINUR NEGATIVE 05/02/2023 2026   KETONESUR NEGATIVE 05/02/2023 2026   PROTEINUR NEGATIVE 05/02/2023 2026   NITRITE NEGATIVE 05/02/2023 2026   LEUKOCYTESUR NEGATIVE 05/02/2023 2026   Sepsis Labs Recent Labs  Lab 05/02/23 1744 05/03/23 0426 05/04/23 0436  WBC 11.0* 12.2* 10.1   Microbiology Recent Results (from the past 240 hours)  Blood Culture (routine x 2)     Status: None (Preliminary result)   Collection Time: 05/02/23  7:28 PM   Specimen: BLOOD  Result Value Ref Range Status   Specimen Description BLOOD LEFT ANTECUBITAL  Final   Special Requests   Final    BOTTLES DRAWN AEROBIC AND ANAEROBIC Blood Culture adequate volume   Culture   Final    NO GROWTH 2 DAYS Performed at [facility], [address], [place], [place] [address]    Report Status PENDING  Incomplete  Blood Culture (routine x 2)     Status: None (Preliminary result)   Collection Time: 05/02/23  7:28 PM   Specimen: BLOOD  Result Value Ref Range Status   Specimen Description BLOOD RIGHT ANTECUBITAL  Final   Special Requests   Final    BOTTLES DRAWN AEROBIC AND ANAEROBIC Blood Culture adequate volume   Culture   Final    NO GROWTH 2 DAYS Performed at [facility], [address], [place], [place] [address]    Report Status PENDING  Incomplete  Time coordinating discharge: 35 minutes SIGNED: Lesa Rape, MD  [facility] 05/04/2023, 11:04 AM  If 7PM-7AM, please contact night-coverage [email or web address]

## 2023-05-04 NOTE — Plan of Care (Signed)

## 2023-05-07 LAB — CULTURE, BLOOD (ROUTINE X 2)
Culture: NO GROWTH
Culture: NO GROWTH
Special Requests: ADEQUATE
Special Requests: ADEQUATE

## 2023-05-08 DIAGNOSIS — Z09 Encounter for follow-up examination after completed treatment for conditions other than malignant neoplasm: Secondary | ICD-10-CM | POA: Diagnosis not present

## 2023-05-08 DIAGNOSIS — L03116 Cellulitis of left lower limb: Secondary | ICD-10-CM | POA: Diagnosis not present

## 2023-05-08 DIAGNOSIS — I4891 Unspecified atrial fibrillation: Secondary | ICD-10-CM | POA: Diagnosis not present

## 2023-05-08 DIAGNOSIS — E876 Hypokalemia: Secondary | ICD-10-CM | POA: Diagnosis not present

## 2023-05-08 DIAGNOSIS — E1142 Type 2 diabetes mellitus with diabetic polyneuropathy: Secondary | ICD-10-CM | POA: Diagnosis not present

## 2023-05-09 NOTE — Care Plan (Signed)
 Ortho Bundle Case Management Note  Patient Details  Name: [name] [name] MRN: [id number] Date of Birth: 1960/12/10  Will discharge to home with family to assist. No DME needed. OPPT set up with [facility]. Patient and MD in agreement with plan. Choice offered                     DME Arranged:    DME Agency:     HH Arranged:    HH Agency:     Additional Comments: Please contact me with any questions of if this plan should need to change.  Cornelia Dieter,  RN,BSN,MHA,CCM  [facility]  [phone] 05/09/2023, 10:24 AM

## 2023-05-10 DIAGNOSIS — L03116 Cellulitis of left lower limb: Secondary | ICD-10-CM | POA: Diagnosis not present

## 2023-05-10 DIAGNOSIS — E876 Hypokalemia: Secondary | ICD-10-CM | POA: Diagnosis not present

## 2023-05-11 NOTE — Progress Notes (Signed)
 Surgery orders requested via Epic inbox.

## 2023-05-14 DIAGNOSIS — R339 Retention of urine, unspecified: Secondary | ICD-10-CM | POA: Diagnosis not present

## 2023-05-15 DIAGNOSIS — L84 Corns and callosities: Secondary | ICD-10-CM | POA: Diagnosis not present

## 2023-05-15 DIAGNOSIS — M79674 Pain in right toe(s): Secondary | ICD-10-CM | POA: Diagnosis not present

## 2023-05-15 DIAGNOSIS — E1142 Type 2 diabetes mellitus with diabetic polyneuropathy: Secondary | ICD-10-CM | POA: Diagnosis not present

## 2023-05-15 DIAGNOSIS — B351 Tinea unguium: Secondary | ICD-10-CM | POA: Diagnosis not present

## 2023-05-15 DIAGNOSIS — M79675 Pain in left toe(s): Secondary | ICD-10-CM | POA: Diagnosis not present

## 2023-05-16 ENCOUNTER — Encounter: Payer: Self-pay | Admitting: Cardiology

## 2023-05-17 DIAGNOSIS — M47816 Spondylosis without myelopathy or radiculopathy, lumbar region: Secondary | ICD-10-CM | POA: Diagnosis not present

## 2023-05-17 DIAGNOSIS — E1142 Type 2 diabetes mellitus with diabetic polyneuropathy: Secondary | ICD-10-CM | POA: Diagnosis not present

## 2023-05-17 DIAGNOSIS — G894 Chronic pain syndrome: Secondary | ICD-10-CM | POA: Diagnosis not present

## 2023-05-17 DIAGNOSIS — M48061 Spinal stenosis, lumbar region without neurogenic claudication: Secondary | ICD-10-CM | POA: Diagnosis not present

## 2023-05-17 NOTE — Progress Notes (Signed)
 COVID Vaccine received:  []  No [x]  Yes Date of any COVID positive Test in last 90 days:  PCP - Alvia Awkward, MD   [phone] (Work)  [phone] (Fax)  Cardiologist - Teddie Favre, MD  Rheumatology- Kirtland Perfect, MD [phone]  Pulmonology-  Eulas Hick, NP  Chest x-ray - 05-02-2023 EKG -  05-03-2023  Epic Stress Test -  ECHO - 01-12-2022  Epic Cardiac Cath - 08-28-2018  [facility] by Dr. Abel Hoe  Pacemaker / ICD device [x]  No []  Yes   Spinal Cord Stimulator:[x]  No []  Yes       History of Sleep Apnea? []  No [x]  Yes   CPAP used?- []  No [x]  Yes  BiPAP Has Home O2 prn 2L  Does the patient monitor blood sugar?   []  N/A   []  No []  Yes  Patient has: []  NO Hx DM   []  Pre-DM   []  DM1  [x]   DM2 Last A1c was: 5.8  on  05-02-23    Does patient have a [employer] or Dexcom? []  No []  Yes   Fasting Blood Sugar Ranges-  Checks Blood Sugar _____ times a day  Blood Thinner / Instructions: Xarelto   Hold? Aspirin  Instructions: none  ERAS Protocol Ordered: [x]  No  []  Yes Patient is to be NPO after: MN  Dental hx: []  Dentures:  []  N/A      []  Bridge or Partial:                   []  Loose or Damaged teeth:   Comments: Patient was given the 5 CHG shower / bath instructions for Reverse Shoulder arthroplasty surgery along with 2 bottles of the CHG soap. Patient will start this on:  05-30-2022            Activity level: Patient is able / unable to climb a flight of stairs without difficulty; []  No CP  []  No SOB, but would have ___   Patient can / can not perform ADLs without assistance.   Anesthesia review: Recent hospitalization at [facility] 05-02-23 to 05-04-23 for LLE cellulitis, DM2- gastroparesis, Psoriasis, RA, lymphedema, HTN, A.Fib (cardioversion 05-04-2014) ,anemia, COPD, CPS- long term opiates (Nucynta ),    Patient denies shortness of breath, fever, cough and chest pain at PAT appointment.  Patient verbalized understanding and agreement to the Pre-Surgical Instructions that were given  to them at this PAT appointment. Patient was also educated of the need to review these PAT instructions again prior to her surgery.I reviewed the appropriate phone numbers to call if they have any and questions or concerns.

## 2023-05-17 NOTE — Patient Instructions (Signed)
 SURGICAL WAITING ROOM VISITATION Patients having surgery or a procedure may have no more than 2 support people in the waiting area - these visitors may rotate in the visitor waiting room.   If the patient needs to stay at the hospital during part of their recovery, the visitor guidelines for inpatient rooms apply.  PRE-OP VISITATION  Pre-op nurse will coordinate an appropriate time for 1 support person to accompany the patient in pre-op.  This support person may not rotate.  This visitor will be contacted when the time is appropriate for the visitor to come back in the pre-op area.  Please refer to the [facility] website for the visitor guidelines for Inpatients (after your surgery is over and you are in a regular room).  You are not required to quarantine at this time prior to your surgery. However, you must do this: Hand Hygiene often Do NOT share personal items Notify your provider if you are in close contact with someone who has COVID or you develop fever 100.4 or greater, new onset of sneezing, cough, sore throat, shortness of breath or body aches.  If you test positive for Covid or have been in contact with anyone that has tested positive in the last 10 days please notify you surgeon.    Your procedure is scheduled on:  [facility]  May 30, 2023  Report to [facility] Main Entrance: [place] [address] entrance where the [employer] is available.   Report to admitting at:07:00 AM  Call this number if you have any questions or problems the morning of surgery [phone]  Do not eat or drink anything after Midnight the night prior to your surgery/procedure.  FOLLOW ANY ADDITIONAL PRE OP INSTRUCTIONS YOU RECEIVED FROM YOUR SURGEON'S OFFICE!!!   Oral Hygiene is also important to reduce your risk of infection.        Remember - BRUSH YOUR TEETH THE MORNING OF SURGERY WITH YOUR REGULAR TOOTHPASTE  Do NOT smoke after Midnight the night before surgery.  STOP TAKING all Vitamins, Herbs  and supplements 1 week before your surgery.   XARELTO - stop taking 72 hours before surgery. Last dose will be taken: SATURDAY 05-26-23  NUCYNTA - Stop 3 days before surgery. Contact Pain Mgmt about a "bridge" medication.   Toujeo  - 43 units q hs-  Night before surgery: Take 21 units  DOS: none Ozempic: last injection will be Tuesday 05-22-2023 Metformin : Day before take as usual. DOS: Don't take Glimepiride- Day before take as usual (no bedtime). DOS: Don't take.   Take ONLY these medicines the morning of surgery with A SIP OF WATER : dexlansoprazole , loratadine , flecainide , Cartia , Gabapentin , Isosorbide , and you may take Oxycodone / APAP if needed for pain. You may used your Nasal sprays and inhalers if needed.   If You have been diagnosed with Sleep Apnea - Bring BiPAP mask and tubing day of surgery. We will provide you with a BiPAP machine on the day of your surgery.                   You may not have any metal on your body including  jewelry, and body piercing  Do not wear make-up, lotions, powders, perfumes or deodorant  Contacts, Hearing Aids, dentures or bridgework may not be worn into surgery. DENTURES WILL BE REMOVED PRIOR TO SURGERY PLEASE DO NOT APPLY "Poly grip" OR ADHESIVES!!!   Patients discharged on the day of surgery will not be allowed to drive home.  Someone NEEDS to stay with you for  the first 24 hours after anesthesia.  Do not bring your home medications to the hospital. The Pharmacy will dispense medications listed on your medication list to you during your admission in the Hospital.  Please read over the following fact sheets you were given: IF YOU HAVE QUESTIONS ABOUT YOUR PRE-OP INSTRUCTIONS, PLEASE CALL [phone].     Pre-operative 5 CHG Bath Instructions   You can play a key role in reducing the risk of infection after surgery. Your skin needs to be as free of germs as possible. You can reduce the number of germs on your skin by washing with CHG  (chlorhexidine  gluconate) soap before surgery. CHG is an antiseptic soap that kills germs and continues to kill germs even after washing.   DO NOT use if you have an allergy to chlorhexidine /CHG or antibacterial soaps. If your skin becomes reddened or irritated, stop using the CHG and notify one of our RNs at [phone]  Please shower with the CHG soap starting 4 days before surgery using the following schedule: START SHOWERS ON    SATURDAY  May 26, 2023                                                                                                                                                                              Please keep in mind the following:  DO NOT shave, including legs and underarms, starting the day of your first shower.   You may shave your face at any point before/day of surgery.   Place clean sheets on your bed the day you start using CHG soap. Use a clean washcloth (not used since being washed) for each shower. DO NOT sleep with pets once you start using the CHG.   CHG Shower Instructions:  If you choose to wash your hair and private area, wash first with your normal shampoo/soap.  After you use shampoo/soap, rinse your hair and body thoroughly to remove shampoo/soap residue.  Turn the water  OFF and apply about 3 tablespoons (45 ml) of CHG soap to a CLEAN washcloth.  Apply CHG soap ONLY FROM YOUR NECK DOWN TO YOUR TOES (washing for 3-5 minutes)  DO NOT use CHG soap on face, private areas, open wounds, or sores.  Pay special attention to the area where your surgery is being performed.  If you are having back surgery, having someone wash your back for you may be helpful.  Wait 2 minutes after CHG soap is applied, then you may rinse off the CHG soap.  Pat dry with a clean towel  Put on clean clothes/pajamas   If you choose to wear lotion, please use ONLY the CHG-compatible lotions on the back of this paper.  Additional instructions for the day of surgery: DO  NOT APPLY any lotions, deodorants, cologne, or perfumes.   Put on clean/comfortable clothes.  Brush your teeth.  Ask your nurse before applying any prescription medications to the skin.      CHG Compatible Lotions   Aveeno Moisturizing lotion  Cetaphil Moisturizing Cream  Cetaphil Moisturizing Lotion  Clairol Herbal Essence Moisturizing Lotion, Dry Skin  Clairol Herbal Essence Moisturizing Lotion, Extra Dry Skin  Clairol Herbal Essence Moisturizing Lotion, Normal Skin  Curel Age Defying Therapeutic Moisturizing Lotion with Alpha Hydroxy  Curel Extreme Care Body Lotion  Curel Soothing Hands Moisturizing Hand Lotion  Curel Therapeutic Moisturizing Cream, Fragrance-Free  Curel Therapeutic Moisturizing Lotion, Fragrance-Free  Curel Therapeutic Moisturizing Lotion, Original Formula  Eucerin Daily Replenishing Lotion  Eucerin Dry Skin Therapy Plus Alpha Hydroxy Crme  Eucerin Dry Skin Therapy Plus Alpha Hydroxy Lotion  Eucerin Original Crme  Eucerin Original Lotion  Eucerin Plus Crme Eucerin Plus Lotion  Eucerin TriLipid Replenishing Lotion  Keri Anti-Bacterial Hand Lotion  Keri Deep Conditioning Original Lotion Dry Skin Formula Softly Scented  Keri Deep Conditioning Original Lotion, Fragrance Free Sensitive Skin Formula  Keri Lotion Fast Absorbing Fragrance Free Sensitive Skin Formula  Keri Lotion Fast Absorbing Softly Scented Dry Skin Formula  Keri Original Lotion  Keri Skin Renewal Lotion Keri Silky Smooth Lotion  Keri Silky Smooth Sensitive Skin Lotion  Nivea Body Creamy Conditioning Oil  Nivea Body Extra Enriched Lotion  Nivea Body Original Lotion  Nivea Body Sheer Moisturizing Lotion Nivea Crme  Nivea Skin Firming Lotion  NutraDerm 30 Skin Lotion  NutraDerm Skin Lotion  NutraDerm Therapeutic Skin Cream  NutraDerm Therapeutic Skin Lotion  ProShield Protective Hand Cream  Provon moisturizing lotion      Preparing for Total Shoulder  Arthroplasty ================================================================= Please follow these instructions carefully, in addition to any other special Bathing information that was explained to you at the Presurgical Appointment:  BENZOYL PEROXIDE 5% GEL: Used to kill bacteria on the skin which could cause an infection at the surgery site.   Please do not use if you have an allergy to benzoyl peroxide. If your skin becomes reddened/irritated stop using the benzoyl peroxide and inform your Doctor.   Starting two days before surgery, apply as follows:  1. Apply benzoyl peroxide gel in the morning and at night. Apply after taking a shower. If you are not taking a shower, clean entire shoulder front, back, and side, along with the armpit with a clean wet washcloth.  2. Place a quarter-sized dollop of the gel on your SHOULDER and rub in thoroughly, making sure to cover the front, back, and side of your shoulder, along with the armpit.   2 Days prior to Surgery  MONDAY May 28, 2023 First Application _______ Morning Second Application _______ Night  Day Before Surgery   TUESDAY  May 29, 2023 First Application______ Morning  On the night before surgery, wash your entire body (except hair, face and private areas) with CHG Soap. THEN, rub in the LAST application of the Benzoyl Peroxide Gel on your shoulder.   3. On the Morning of Surgery wash your BODY AGAIN with CHG Soap (except hair, face and private areas)  4. DO NOT USE THE BENZOYL PEROXIDE GEL ON THE DAY OF YOUR SURGERY      FAILURE TO FOLLOW THESE INSTRUCTIONS MAY RESULT IN THE CANCELLATION OF YOUR SURGERY  PATIENT SIGNATURE_________________________________  NURSE SIGNATURE__________________________________  ________________________________________________________________________       [name] [name]  An incentive spirometer is a tool that can help keep your lungs clear and active. This tool measures how well you  are filling your lungs with each breath. Taking long deep breaths may help reverse or decrease the chance of developing breathing (pulmonary) problems (especially infection) following: A long period of time when you are unable to move or be active. BEFORE THE PROCEDURE  If the spirometer includes an indicator to show your best effort, your nurse or respiratory therapist will set it to a desired goal. If possible, sit up straight or lean slightly forward. Try not to slouch. Hold the incentive spirometer in an upright position. INSTRUCTIONS FOR USE  Sit on the edge of your bed if possible, or sit up as far as you can in bed or on a chair. Hold the incentive spirometer in an upright position. Breathe out normally. Place the mouthpiece in your mouth and seal your lips tightly around it. Breathe in slowly and as deeply as possible, raising the piston or the ball toward the top of the column. Hold your breath for 3-5 seconds or for as long as possible. Allow the piston or ball to fall to the bottom of the column. Remove the mouthpiece from your mouth and breathe out normally. Rest for a few seconds and repeat Steps 1 through 7 at least 10 times every 1-2 hours when you are awake. Take your time and take a few normal breaths between deep breaths. The spirometer may include an indicator to show your best effort. Use the indicator as a goal to work toward during each repetition. After each set of 10 deep breaths, practice coughing to be sure your lungs are clear. If you have an incision (the cut made at the time of surgery), support your incision when coughing by placing a pillow or rolled up towels firmly against it. Once you are able to get out of bed, walk around indoors and cough well. You may stop using the incentive spirometer when instructed by your caregiver.  RISKS AND COMPLICATIONS Take your time so you do not get dizzy or light-headed. If you are in pain, you may need to take or ask for pain  medication before doing incentive spirometry. It is harder to take a deep breath if you are having pain. AFTER USE Rest and breathe slowly and easily. It can be helpful to keep track of a log of your progress. Your caregiver can provide you with a simple table to help with this. If you are using the spirometer at home, follow these instructions: SEEK MEDICAL CARE IF:  You are having difficultly using the spirometer. You have trouble using the spirometer as often as instructed. Your pain medication is not giving enough relief while using the spirometer. You develop fever of 100.5 F (38.1 C) or higher.                                                                                                    SEEK IMMEDIATE MEDICAL CARE IF:  You cough up bloody sputum that had not been present before. You develop  fever of 102 F (38.9 C) or greater. You develop worsening pain at or near the incision site. MAKE SURE YOU:  Understand these instructions. Will watch your condition. Will get help right away if you are not doing well or get worse. Document Released: 05/01/2006 Document Revised: 03/13/2011 Document Reviewed: 07/02/2006 [facility] Patient Information 2014 [place], [place].        If you would like to see a video about joint replacement:   [email or web address]

## 2023-05-18 ENCOUNTER — Other Ambulatory Visit: Payer: Self-pay

## 2023-05-18 ENCOUNTER — Encounter (HOSPITAL_COMMUNITY)
Admission: RE | Admit: 2023-05-18 | Discharge: 2023-05-18 | Disposition: A | Discharge status: Home / Self Care | Source: Ambulatory Visit | Attending: Orthopaedic Surgery | Admitting: Orthopaedic Surgery

## 2023-05-18 ENCOUNTER — Encounter (HOSPITAL_COMMUNITY): Payer: Self-pay | Admitting: Physician Assistant

## 2023-05-18 ENCOUNTER — Encounter (HOSPITAL_COMMUNITY): Payer: Self-pay

## 2023-05-18 VITALS — BP 101/52 | HR 56 | Temp 98.7°F | Resp 24 | Ht 68.0 in | Wt 325.0 lb

## 2023-05-18 DIAGNOSIS — Z01812 Encounter for preprocedural laboratory examination: Secondary | ICD-10-CM | POA: Insufficient documentation

## 2023-05-18 DIAGNOSIS — Z79899 Other long term (current) drug therapy: Secondary | ICD-10-CM | POA: Insufficient documentation

## 2023-05-18 DIAGNOSIS — Z01818 Encounter for other preprocedural examination: Secondary | ICD-10-CM

## 2023-05-18 DIAGNOSIS — E1342 Other specified diabetes mellitus with diabetic polyneuropathy: Secondary | ICD-10-CM | POA: Insufficient documentation

## 2023-05-18 DIAGNOSIS — Z794 Long term (current) use of insulin: Secondary | ICD-10-CM | POA: Diagnosis not present

## 2023-05-18 DIAGNOSIS — G894 Chronic pain syndrome: Secondary | ICD-10-CM | POA: Insufficient documentation

## 2023-05-18 HISTORY — DX: Arthropathic psoriasis, unspecified: L40.50

## 2023-05-18 HISTORY — DX: Chronic pain syndrome: G89.4

## 2023-05-18 HISTORY — DX: Anemia, unspecified: D64.9

## 2023-05-18 HISTORY — DX: Pneumonia, unspecified organism: J18.9

## 2023-05-18 HISTORY — DX: Rheumatoid arthritis, unspecified: M06.9

## 2023-05-18 LAB — CBC
HCT: 30.8 % — ABNORMAL LOW (ref 36.0–46.0)
Hemoglobin: 9.6 g/dL — ABNORMAL LOW (ref 12.0–15.0)
MCH: 28.7 pg (ref 26.0–34.0)
MCHC: 31.2 g/dL (ref 30.0–36.0)
MCV: 92.2 fL (ref 80.0–100.0)
Platelets: 336 10*3/uL (ref 150–400)
RBC: 3.34 MIL/uL — ABNORMAL LOW (ref 3.87–5.11)
RDW: 13.1 % (ref 11.5–15.5)
WBC: 11.5 10*3/uL — ABNORMAL HIGH (ref 4.0–10.5)
nRBC: 0 % (ref 0.0–0.2)

## 2023-05-18 LAB — COMPREHENSIVE METABOLIC PANEL WITH GFR
ALT: 13 U/L (ref 0–44)
AST: 16 U/L (ref 15–41)
Albumin: 3.6 g/dL (ref 3.5–5.0)
Alkaline Phosphatase: 63 U/L (ref 38–126)
Anion gap: 9 (ref 5–15)
BUN: 40 mg/dL — ABNORMAL HIGH (ref 8–23)
CO2: 26 mmol/L (ref 22–32)
Calcium: 9.3 mg/dL (ref 8.9–10.3)
Chloride: 99 mmol/L (ref 98–111)
Creatinine, Ser: 1.06 mg/dL — ABNORMAL HIGH (ref 0.44–1.00)
GFR, Estimated: 59 mL/min — ABNORMAL LOW (ref >60)
Glucose, Bld: 102 mg/dL — ABNORMAL HIGH (ref 70–99)
Potassium: 4.5 mmol/L (ref 3.5–5.1)
Sodium: 134 mmol/L — ABNORMAL LOW (ref 135–145)
Total Bilirubin: 0.5 mg/dL (ref 0.0–1.2)
Total Protein: 7 g/dL (ref 6.5–8.1)

## 2023-05-18 LAB — SURGICAL PCR SCREEN
MRSA, PCR: NEGATIVE
Staphylococcus aureus: NEGATIVE

## 2023-05-22 DIAGNOSIS — Z299 Encounter for prophylactic measures, unspecified: Secondary | ICD-10-CM | POA: Diagnosis not present

## 2023-05-22 DIAGNOSIS — L03116 Cellulitis of left lower limb: Secondary | ICD-10-CM | POA: Diagnosis not present

## 2023-05-22 DIAGNOSIS — E1165 Type 2 diabetes mellitus with hyperglycemia: Secondary | ICD-10-CM | POA: Diagnosis not present

## 2023-05-22 DIAGNOSIS — I10 Essential (primary) hypertension: Secondary | ICD-10-CM | POA: Diagnosis not present

## 2023-05-22 DIAGNOSIS — E11622 Type 2 diabetes mellitus with other skin ulcer: Secondary | ICD-10-CM | POA: Diagnosis not present

## 2023-05-22 DIAGNOSIS — I5032 Chronic diastolic (congestive) heart failure: Secondary | ICD-10-CM | POA: Diagnosis not present

## 2023-05-27 DIAGNOSIS — Z466 Encounter for fitting and adjustment of urinary device: Secondary | ICD-10-CM | POA: Diagnosis not present

## 2023-05-27 DIAGNOSIS — G4730 Sleep apnea, unspecified: Secondary | ICD-10-CM | POA: Diagnosis not present

## 2023-05-27 DIAGNOSIS — Z7985 Long-term (current) use of injectable non-insulin antidiabetic drugs: Secondary | ICD-10-CM | POA: Diagnosis not present

## 2023-05-27 DIAGNOSIS — K3184 Gastroparesis: Secondary | ICD-10-CM | POA: Diagnosis not present

## 2023-05-27 DIAGNOSIS — I5032 Chronic diastolic (congestive) heart failure: Secondary | ICD-10-CM | POA: Diagnosis not present

## 2023-05-27 DIAGNOSIS — I4821 Permanent atrial fibrillation: Secondary | ICD-10-CM | POA: Diagnosis not present

## 2023-05-27 DIAGNOSIS — E1142 Type 2 diabetes mellitus with diabetic polyneuropathy: Secondary | ICD-10-CM | POA: Diagnosis not present

## 2023-05-27 DIAGNOSIS — Z7984 Long term (current) use of oral hypoglycemic drugs: Secondary | ICD-10-CM | POA: Diagnosis not present

## 2023-05-27 DIAGNOSIS — D649 Anemia, unspecified: Secondary | ICD-10-CM | POA: Diagnosis not present

## 2023-05-27 DIAGNOSIS — Z9181 History of falling: Secondary | ICD-10-CM | POA: Diagnosis not present

## 2023-05-27 DIAGNOSIS — M069 Rheumatoid arthritis, unspecified: Secondary | ICD-10-CM | POA: Diagnosis not present

## 2023-05-27 DIAGNOSIS — E1143 Type 2 diabetes mellitus with diabetic autonomic (poly)neuropathy: Secondary | ICD-10-CM | POA: Diagnosis not present

## 2023-05-27 DIAGNOSIS — E1165 Type 2 diabetes mellitus with hyperglycemia: Secondary | ICD-10-CM | POA: Diagnosis not present

## 2023-05-27 DIAGNOSIS — M4800 Spinal stenosis, site unspecified: Secondary | ICD-10-CM | POA: Diagnosis not present

## 2023-05-27 DIAGNOSIS — M797 Fibromyalgia: Secondary | ICD-10-CM | POA: Diagnosis not present

## 2023-05-27 DIAGNOSIS — Z7901 Long term (current) use of anticoagulants: Secondary | ICD-10-CM | POA: Diagnosis not present

## 2023-05-27 DIAGNOSIS — L03116 Cellulitis of left lower limb: Secondary | ICD-10-CM | POA: Diagnosis not present

## 2023-05-27 DIAGNOSIS — N8110 Cystocele, unspecified: Secondary | ICD-10-CM | POA: Diagnosis not present

## 2023-05-27 DIAGNOSIS — J449 Chronic obstructive pulmonary disease, unspecified: Secondary | ICD-10-CM | POA: Diagnosis not present

## 2023-05-30 ENCOUNTER — Ambulatory Visit (HOSPITAL_COMMUNITY): Admission: RE | Admit: 2023-05-30 | Source: Ambulatory Visit | Admitting: Orthopaedic Surgery

## 2023-05-30 ENCOUNTER — Encounter (HOSPITAL_COMMUNITY): Admission: RE | Payer: Self-pay | Source: Ambulatory Visit

## 2023-05-30 DIAGNOSIS — M797 Fibromyalgia: Secondary | ICD-10-CM | POA: Diagnosis not present

## 2023-05-30 DIAGNOSIS — K3184 Gastroparesis: Secondary | ICD-10-CM | POA: Diagnosis not present

## 2023-05-30 DIAGNOSIS — M069 Rheumatoid arthritis, unspecified: Secondary | ICD-10-CM | POA: Diagnosis not present

## 2023-05-30 DIAGNOSIS — E1143 Type 2 diabetes mellitus with diabetic autonomic (poly)neuropathy: Secondary | ICD-10-CM | POA: Diagnosis not present

## 2023-05-30 DIAGNOSIS — E1165 Type 2 diabetes mellitus with hyperglycemia: Secondary | ICD-10-CM | POA: Diagnosis not present

## 2023-05-30 DIAGNOSIS — E1142 Type 2 diabetes mellitus with diabetic polyneuropathy: Secondary | ICD-10-CM | POA: Diagnosis not present

## 2023-05-30 DIAGNOSIS — I4821 Permanent atrial fibrillation: Secondary | ICD-10-CM | POA: Diagnosis not present

## 2023-05-30 DIAGNOSIS — N8110 Cystocele, unspecified: Secondary | ICD-10-CM | POA: Diagnosis not present

## 2023-05-30 DIAGNOSIS — G4730 Sleep apnea, unspecified: Secondary | ICD-10-CM | POA: Diagnosis not present

## 2023-05-30 DIAGNOSIS — M4800 Spinal stenosis, site unspecified: Secondary | ICD-10-CM | POA: Diagnosis not present

## 2023-05-30 DIAGNOSIS — D649 Anemia, unspecified: Secondary | ICD-10-CM | POA: Diagnosis not present

## 2023-05-30 DIAGNOSIS — J449 Chronic obstructive pulmonary disease, unspecified: Secondary | ICD-10-CM | POA: Diagnosis not present

## 2023-05-30 DIAGNOSIS — Z466 Encounter for fitting and adjustment of urinary device: Secondary | ICD-10-CM | POA: Diagnosis not present

## 2023-05-30 DIAGNOSIS — Z7985 Long-term (current) use of injectable non-insulin antidiabetic drugs: Secondary | ICD-10-CM | POA: Diagnosis not present

## 2023-05-30 DIAGNOSIS — I5032 Chronic diastolic (congestive) heart failure: Secondary | ICD-10-CM | POA: Diagnosis not present

## 2023-05-30 DIAGNOSIS — Z9181 History of falling: Secondary | ICD-10-CM | POA: Diagnosis not present

## 2023-05-30 DIAGNOSIS — Z7901 Long term (current) use of anticoagulants: Secondary | ICD-10-CM | POA: Diagnosis not present

## 2023-05-30 DIAGNOSIS — Z7984 Long term (current) use of oral hypoglycemic drugs: Secondary | ICD-10-CM | POA: Diagnosis not present

## 2023-05-30 DIAGNOSIS — L03116 Cellulitis of left lower limb: Secondary | ICD-10-CM | POA: Diagnosis not present

## 2023-05-30 SURGERY — ARTHROPLASTY, SHOULDER, TOTAL, REVERSE
Anesthesia: General | Site: Shoulder | Laterality: Left

## 2023-06-02 DIAGNOSIS — E1165 Type 2 diabetes mellitus with hyperglycemia: Secondary | ICD-10-CM | POA: Diagnosis not present

## 2023-06-03 DIAGNOSIS — J449 Chronic obstructive pulmonary disease, unspecified: Secondary | ICD-10-CM | POA: Diagnosis not present

## 2023-06-03 DIAGNOSIS — G4733 Obstructive sleep apnea (adult) (pediatric): Secondary | ICD-10-CM | POA: Diagnosis not present

## 2023-06-04 ENCOUNTER — Ambulatory Visit: Admitting: Physical Therapy

## 2023-06-04 DIAGNOSIS — Z7984 Long term (current) use of oral hypoglycemic drugs: Secondary | ICD-10-CM | POA: Diagnosis not present

## 2023-06-04 DIAGNOSIS — I5032 Chronic diastolic (congestive) heart failure: Secondary | ICD-10-CM | POA: Diagnosis not present

## 2023-06-04 DIAGNOSIS — J449 Chronic obstructive pulmonary disease, unspecified: Secondary | ICD-10-CM | POA: Diagnosis not present

## 2023-06-04 DIAGNOSIS — E1143 Type 2 diabetes mellitus with diabetic autonomic (poly)neuropathy: Secondary | ICD-10-CM | POA: Diagnosis not present

## 2023-06-04 DIAGNOSIS — I4821 Permanent atrial fibrillation: Secondary | ICD-10-CM | POA: Diagnosis not present

## 2023-06-04 DIAGNOSIS — D649 Anemia, unspecified: Secondary | ICD-10-CM | POA: Diagnosis not present

## 2023-06-04 DIAGNOSIS — Z9181 History of falling: Secondary | ICD-10-CM | POA: Diagnosis not present

## 2023-06-04 DIAGNOSIS — G4730 Sleep apnea, unspecified: Secondary | ICD-10-CM | POA: Diagnosis not present

## 2023-06-04 DIAGNOSIS — M797 Fibromyalgia: Secondary | ICD-10-CM | POA: Diagnosis not present

## 2023-06-04 DIAGNOSIS — E1165 Type 2 diabetes mellitus with hyperglycemia: Secondary | ICD-10-CM | POA: Diagnosis not present

## 2023-06-04 DIAGNOSIS — L03116 Cellulitis of left lower limb: Secondary | ICD-10-CM | POA: Diagnosis not present

## 2023-06-04 DIAGNOSIS — K3184 Gastroparesis: Secondary | ICD-10-CM | POA: Diagnosis not present

## 2023-06-04 DIAGNOSIS — Z7985 Long-term (current) use of injectable non-insulin antidiabetic drugs: Secondary | ICD-10-CM | POA: Diagnosis not present

## 2023-06-04 DIAGNOSIS — N8110 Cystocele, unspecified: Secondary | ICD-10-CM | POA: Diagnosis not present

## 2023-06-04 DIAGNOSIS — M069 Rheumatoid arthritis, unspecified: Secondary | ICD-10-CM | POA: Diagnosis not present

## 2023-06-04 DIAGNOSIS — Z466 Encounter for fitting and adjustment of urinary device: Secondary | ICD-10-CM | POA: Diagnosis not present

## 2023-06-04 DIAGNOSIS — M4800 Spinal stenosis, site unspecified: Secondary | ICD-10-CM | POA: Diagnosis not present

## 2023-06-04 DIAGNOSIS — Z7901 Long term (current) use of anticoagulants: Secondary | ICD-10-CM | POA: Diagnosis not present

## 2023-06-04 DIAGNOSIS — E1142 Type 2 diabetes mellitus with diabetic polyneuropathy: Secondary | ICD-10-CM | POA: Diagnosis not present

## 2023-06-11 ENCOUNTER — Other Ambulatory Visit: Payer: Self-pay | Admitting: Cardiology

## 2023-06-12 DIAGNOSIS — M797 Fibromyalgia: Secondary | ICD-10-CM | POA: Diagnosis not present

## 2023-06-12 DIAGNOSIS — D649 Anemia, unspecified: Secondary | ICD-10-CM | POA: Diagnosis not present

## 2023-06-12 DIAGNOSIS — K3184 Gastroparesis: Secondary | ICD-10-CM | POA: Diagnosis not present

## 2023-06-12 DIAGNOSIS — I4821 Permanent atrial fibrillation: Secondary | ICD-10-CM | POA: Diagnosis not present

## 2023-06-12 DIAGNOSIS — J449 Chronic obstructive pulmonary disease, unspecified: Secondary | ICD-10-CM | POA: Diagnosis not present

## 2023-06-12 DIAGNOSIS — Z7901 Long term (current) use of anticoagulants: Secondary | ICD-10-CM | POA: Diagnosis not present

## 2023-06-12 DIAGNOSIS — Z7984 Long term (current) use of oral hypoglycemic drugs: Secondary | ICD-10-CM | POA: Diagnosis not present

## 2023-06-12 DIAGNOSIS — L03116 Cellulitis of left lower limb: Secondary | ICD-10-CM | POA: Diagnosis not present

## 2023-06-12 DIAGNOSIS — E1143 Type 2 diabetes mellitus with diabetic autonomic (poly)neuropathy: Secondary | ICD-10-CM | POA: Diagnosis not present

## 2023-06-12 DIAGNOSIS — M069 Rheumatoid arthritis, unspecified: Secondary | ICD-10-CM | POA: Diagnosis not present

## 2023-06-12 DIAGNOSIS — I5032 Chronic diastolic (congestive) heart failure: Secondary | ICD-10-CM | POA: Diagnosis not present

## 2023-06-12 DIAGNOSIS — M4800 Spinal stenosis, site unspecified: Secondary | ICD-10-CM | POA: Diagnosis not present

## 2023-06-12 DIAGNOSIS — G4730 Sleep apnea, unspecified: Secondary | ICD-10-CM | POA: Diagnosis not present

## 2023-06-12 DIAGNOSIS — E1142 Type 2 diabetes mellitus with diabetic polyneuropathy: Secondary | ICD-10-CM | POA: Diagnosis not present

## 2023-06-12 DIAGNOSIS — N8110 Cystocele, unspecified: Secondary | ICD-10-CM | POA: Diagnosis not present

## 2023-06-12 DIAGNOSIS — Z466 Encounter for fitting and adjustment of urinary device: Secondary | ICD-10-CM | POA: Diagnosis not present

## 2023-06-12 DIAGNOSIS — E1165 Type 2 diabetes mellitus with hyperglycemia: Secondary | ICD-10-CM | POA: Diagnosis not present

## 2023-06-12 DIAGNOSIS — Z9181 History of falling: Secondary | ICD-10-CM | POA: Diagnosis not present

## 2023-06-12 DIAGNOSIS — Z7985 Long-term (current) use of injectable non-insulin antidiabetic drugs: Secondary | ICD-10-CM | POA: Diagnosis not present

## 2023-06-13 DIAGNOSIS — S81802A Unspecified open wound, left lower leg, initial encounter: Secondary | ICD-10-CM | POA: Diagnosis not present

## 2023-06-14 DIAGNOSIS — M48061 Spinal stenosis, lumbar region without neurogenic claudication: Secondary | ICD-10-CM | POA: Diagnosis not present

## 2023-06-14 DIAGNOSIS — M47816 Spondylosis without myelopathy or radiculopathy, lumbar region: Secondary | ICD-10-CM | POA: Diagnosis not present

## 2023-06-14 DIAGNOSIS — G894 Chronic pain syndrome: Secondary | ICD-10-CM | POA: Diagnosis not present

## 2023-06-14 DIAGNOSIS — E1142 Type 2 diabetes mellitus with diabetic polyneuropathy: Secondary | ICD-10-CM | POA: Diagnosis not present

## 2023-06-15 DIAGNOSIS — I5032 Chronic diastolic (congestive) heart failure: Secondary | ICD-10-CM | POA: Diagnosis not present

## 2023-06-15 DIAGNOSIS — W1839XA Other fall on same level, initial encounter: Secondary | ICD-10-CM | POA: Diagnosis not present

## 2023-06-15 DIAGNOSIS — I491 Atrial premature depolarization: Secondary | ICD-10-CM | POA: Diagnosis not present

## 2023-06-15 DIAGNOSIS — I4821 Permanent atrial fibrillation: Secondary | ICD-10-CM | POA: Diagnosis not present

## 2023-06-15 DIAGNOSIS — Z9181 History of falling: Secondary | ICD-10-CM | POA: Diagnosis not present

## 2023-06-15 DIAGNOSIS — Z7901 Long term (current) use of anticoagulants: Secondary | ICD-10-CM | POA: Diagnosis not present

## 2023-06-15 DIAGNOSIS — M797 Fibromyalgia: Secondary | ICD-10-CM | POA: Diagnosis not present

## 2023-06-15 DIAGNOSIS — G4730 Sleep apnea, unspecified: Secondary | ICD-10-CM | POA: Diagnosis not present

## 2023-06-15 DIAGNOSIS — I110 Hypertensive heart disease with heart failure: Secondary | ICD-10-CM | POA: Diagnosis not present

## 2023-06-15 DIAGNOSIS — E1142 Type 2 diabetes mellitus with diabetic polyneuropathy: Secondary | ICD-10-CM | POA: Diagnosis not present

## 2023-06-15 DIAGNOSIS — I2510 Atherosclerotic heart disease of native coronary artery without angina pectoris: Secondary | ICD-10-CM | POA: Diagnosis not present

## 2023-06-15 DIAGNOSIS — S098XXA Other specified injuries of head, initial encounter: Secondary | ICD-10-CM | POA: Diagnosis not present

## 2023-06-15 DIAGNOSIS — I499 Cardiac arrhythmia, unspecified: Secondary | ICD-10-CM | POA: Diagnosis not present

## 2023-06-15 DIAGNOSIS — J449 Chronic obstructive pulmonary disease, unspecified: Secondary | ICD-10-CM | POA: Diagnosis not present

## 2023-06-15 DIAGNOSIS — L03116 Cellulitis of left lower limb: Secondary | ICD-10-CM | POA: Diagnosis not present

## 2023-06-15 DIAGNOSIS — Z87891 Personal history of nicotine dependence: Secondary | ICD-10-CM | POA: Diagnosis not present

## 2023-06-15 DIAGNOSIS — Z7984 Long term (current) use of oral hypoglycemic drugs: Secondary | ICD-10-CM | POA: Diagnosis not present

## 2023-06-15 DIAGNOSIS — S0990XA Unspecified injury of head, initial encounter: Secondary | ICD-10-CM | POA: Diagnosis not present

## 2023-06-15 DIAGNOSIS — E119 Type 2 diabetes mellitus without complications: Secondary | ICD-10-CM | POA: Diagnosis not present

## 2023-06-15 DIAGNOSIS — S0083XA Contusion of other part of head, initial encounter: Secondary | ICD-10-CM | POA: Diagnosis not present

## 2023-06-15 DIAGNOSIS — E1165 Type 2 diabetes mellitus with hyperglycemia: Secondary | ICD-10-CM | POA: Diagnosis not present

## 2023-06-15 DIAGNOSIS — M4800 Spinal stenosis, site unspecified: Secondary | ICD-10-CM | POA: Diagnosis not present

## 2023-06-15 DIAGNOSIS — E1143 Type 2 diabetes mellitus with diabetic autonomic (poly)neuropathy: Secondary | ICD-10-CM | POA: Diagnosis not present

## 2023-06-15 DIAGNOSIS — R609 Edema, unspecified: Secondary | ICD-10-CM | POA: Diagnosis not present

## 2023-06-15 DIAGNOSIS — N8110 Cystocele, unspecified: Secondary | ICD-10-CM | POA: Diagnosis not present

## 2023-06-15 DIAGNOSIS — K2100 Gastro-esophageal reflux disease with esophagitis, without bleeding: Secondary | ICD-10-CM | POA: Diagnosis not present

## 2023-06-15 DIAGNOSIS — M25561 Pain in right knee: Secondary | ICD-10-CM | POA: Diagnosis not present

## 2023-06-15 DIAGNOSIS — Z466 Encounter for fitting and adjustment of urinary device: Secondary | ICD-10-CM | POA: Diagnosis not present

## 2023-06-15 DIAGNOSIS — W19XXXA Unspecified fall, initial encounter: Secondary | ICD-10-CM | POA: Diagnosis not present

## 2023-06-15 DIAGNOSIS — I509 Heart failure, unspecified: Secondary | ICD-10-CM | POA: Diagnosis not present

## 2023-06-15 DIAGNOSIS — S8981XA Other specified injuries of right lower leg, initial encounter: Secondary | ICD-10-CM | POA: Diagnosis not present

## 2023-06-15 DIAGNOSIS — M1711 Unilateral primary osteoarthritis, right knee: Secondary | ICD-10-CM | POA: Diagnosis not present

## 2023-06-15 DIAGNOSIS — K3184 Gastroparesis: Secondary | ICD-10-CM | POA: Diagnosis not present

## 2023-06-15 DIAGNOSIS — Z7985 Long-term (current) use of injectable non-insulin antidiabetic drugs: Secondary | ICD-10-CM | POA: Diagnosis not present

## 2023-06-15 DIAGNOSIS — E7800 Pure hypercholesterolemia, unspecified: Secondary | ICD-10-CM | POA: Diagnosis not present

## 2023-06-15 DIAGNOSIS — Z881 Allergy status to other antibiotic agents status: Secondary | ICD-10-CM | POA: Diagnosis not present

## 2023-06-15 DIAGNOSIS — R531 Weakness: Secondary | ICD-10-CM | POA: Diagnosis not present

## 2023-06-15 DIAGNOSIS — D649 Anemia, unspecified: Secondary | ICD-10-CM | POA: Diagnosis not present

## 2023-06-15 DIAGNOSIS — M069 Rheumatoid arthritis, unspecified: Secondary | ICD-10-CM | POA: Diagnosis not present

## 2023-06-18 DIAGNOSIS — D649 Anemia, unspecified: Secondary | ICD-10-CM | POA: Diagnosis not present

## 2023-06-18 DIAGNOSIS — Z7985 Long-term (current) use of injectable non-insulin antidiabetic drugs: Secondary | ICD-10-CM | POA: Diagnosis not present

## 2023-06-18 DIAGNOSIS — N8110 Cystocele, unspecified: Secondary | ICD-10-CM | POA: Diagnosis not present

## 2023-06-18 DIAGNOSIS — E1165 Type 2 diabetes mellitus with hyperglycemia: Secondary | ICD-10-CM | POA: Diagnosis not present

## 2023-06-18 DIAGNOSIS — M797 Fibromyalgia: Secondary | ICD-10-CM | POA: Diagnosis not present

## 2023-06-18 DIAGNOSIS — Z7984 Long term (current) use of oral hypoglycemic drugs: Secondary | ICD-10-CM | POA: Diagnosis not present

## 2023-06-18 DIAGNOSIS — K3184 Gastroparesis: Secondary | ICD-10-CM | POA: Diagnosis not present

## 2023-06-18 DIAGNOSIS — Z9181 History of falling: Secondary | ICD-10-CM | POA: Diagnosis not present

## 2023-06-18 DIAGNOSIS — G4730 Sleep apnea, unspecified: Secondary | ICD-10-CM | POA: Diagnosis not present

## 2023-06-18 DIAGNOSIS — J449 Chronic obstructive pulmonary disease, unspecified: Secondary | ICD-10-CM | POA: Diagnosis not present

## 2023-06-18 DIAGNOSIS — I4821 Permanent atrial fibrillation: Secondary | ICD-10-CM | POA: Diagnosis not present

## 2023-06-18 DIAGNOSIS — M4800 Spinal stenosis, site unspecified: Secondary | ICD-10-CM | POA: Diagnosis not present

## 2023-06-18 DIAGNOSIS — Z466 Encounter for fitting and adjustment of urinary device: Secondary | ICD-10-CM | POA: Diagnosis not present

## 2023-06-18 DIAGNOSIS — I5032 Chronic diastolic (congestive) heart failure: Secondary | ICD-10-CM | POA: Diagnosis not present

## 2023-06-18 DIAGNOSIS — E1143 Type 2 diabetes mellitus with diabetic autonomic (poly)neuropathy: Secondary | ICD-10-CM | POA: Diagnosis not present

## 2023-06-18 DIAGNOSIS — E1142 Type 2 diabetes mellitus with diabetic polyneuropathy: Secondary | ICD-10-CM | POA: Diagnosis not present

## 2023-06-18 DIAGNOSIS — L03116 Cellulitis of left lower limb: Secondary | ICD-10-CM | POA: Diagnosis not present

## 2023-06-18 DIAGNOSIS — M069 Rheumatoid arthritis, unspecified: Secondary | ICD-10-CM | POA: Diagnosis not present

## 2023-06-18 DIAGNOSIS — Z7901 Long term (current) use of anticoagulants: Secondary | ICD-10-CM | POA: Diagnosis not present

## 2023-06-20 DIAGNOSIS — R339 Retention of urine, unspecified: Secondary | ICD-10-CM | POA: Diagnosis not present

## 2023-06-22 DIAGNOSIS — N8110 Cystocele, unspecified: Secondary | ICD-10-CM | POA: Diagnosis not present

## 2023-06-22 DIAGNOSIS — Z7985 Long-term (current) use of injectable non-insulin antidiabetic drugs: Secondary | ICD-10-CM | POA: Diagnosis not present

## 2023-06-22 DIAGNOSIS — G4730 Sleep apnea, unspecified: Secondary | ICD-10-CM | POA: Diagnosis not present

## 2023-06-22 DIAGNOSIS — Z7901 Long term (current) use of anticoagulants: Secondary | ICD-10-CM | POA: Diagnosis not present

## 2023-06-22 DIAGNOSIS — Z9181 History of falling: Secondary | ICD-10-CM | POA: Diagnosis not present

## 2023-06-22 DIAGNOSIS — E1165 Type 2 diabetes mellitus with hyperglycemia: Secondary | ICD-10-CM | POA: Diagnosis not present

## 2023-06-22 DIAGNOSIS — K3184 Gastroparesis: Secondary | ICD-10-CM | POA: Diagnosis not present

## 2023-06-22 DIAGNOSIS — I5032 Chronic diastolic (congestive) heart failure: Secondary | ICD-10-CM | POA: Diagnosis not present

## 2023-06-22 DIAGNOSIS — D649 Anemia, unspecified: Secondary | ICD-10-CM | POA: Diagnosis not present

## 2023-06-22 DIAGNOSIS — Z466 Encounter for fitting and adjustment of urinary device: Secondary | ICD-10-CM | POA: Diagnosis not present

## 2023-06-22 DIAGNOSIS — E1142 Type 2 diabetes mellitus with diabetic polyneuropathy: Secondary | ICD-10-CM | POA: Diagnosis not present

## 2023-06-22 DIAGNOSIS — J449 Chronic obstructive pulmonary disease, unspecified: Secondary | ICD-10-CM | POA: Diagnosis not present

## 2023-06-22 DIAGNOSIS — Z7984 Long term (current) use of oral hypoglycemic drugs: Secondary | ICD-10-CM | POA: Diagnosis not present

## 2023-06-22 DIAGNOSIS — M4800 Spinal stenosis, site unspecified: Secondary | ICD-10-CM | POA: Diagnosis not present

## 2023-06-22 DIAGNOSIS — L03116 Cellulitis of left lower limb: Secondary | ICD-10-CM | POA: Diagnosis not present

## 2023-06-22 DIAGNOSIS — I4821 Permanent atrial fibrillation: Secondary | ICD-10-CM | POA: Diagnosis not present

## 2023-06-22 DIAGNOSIS — M797 Fibromyalgia: Secondary | ICD-10-CM | POA: Diagnosis not present

## 2023-06-22 DIAGNOSIS — E1143 Type 2 diabetes mellitus with diabetic autonomic (poly)neuropathy: Secondary | ICD-10-CM | POA: Diagnosis not present

## 2023-06-22 DIAGNOSIS — M069 Rheumatoid arthritis, unspecified: Secondary | ICD-10-CM | POA: Diagnosis not present

## 2023-06-25 DIAGNOSIS — Z299 Encounter for prophylactic measures, unspecified: Secondary | ICD-10-CM | POA: Diagnosis not present

## 2023-06-25 DIAGNOSIS — D649 Anemia, unspecified: Secondary | ICD-10-CM | POA: Diagnosis not present

## 2023-06-25 DIAGNOSIS — I5032 Chronic diastolic (congestive) heart failure: Secondary | ICD-10-CM | POA: Diagnosis not present

## 2023-06-25 DIAGNOSIS — Z79899 Other long term (current) drug therapy: Secondary | ICD-10-CM | POA: Diagnosis not present

## 2023-06-25 DIAGNOSIS — I10 Essential (primary) hypertension: Secondary | ICD-10-CM | POA: Diagnosis not present

## 2023-06-26 DIAGNOSIS — N8110 Cystocele, unspecified: Secondary | ICD-10-CM | POA: Diagnosis not present

## 2023-06-26 DIAGNOSIS — Z9181 History of falling: Secondary | ICD-10-CM | POA: Diagnosis not present

## 2023-06-26 DIAGNOSIS — I5032 Chronic diastolic (congestive) heart failure: Secondary | ICD-10-CM | POA: Diagnosis not present

## 2023-06-26 DIAGNOSIS — E1165 Type 2 diabetes mellitus with hyperglycemia: Secondary | ICD-10-CM | POA: Diagnosis not present

## 2023-06-26 DIAGNOSIS — K3184 Gastroparesis: Secondary | ICD-10-CM | POA: Diagnosis not present

## 2023-06-26 DIAGNOSIS — E1142 Type 2 diabetes mellitus with diabetic polyneuropathy: Secondary | ICD-10-CM | POA: Diagnosis not present

## 2023-06-26 DIAGNOSIS — E1143 Type 2 diabetes mellitus with diabetic autonomic (poly)neuropathy: Secondary | ICD-10-CM | POA: Diagnosis not present

## 2023-06-26 DIAGNOSIS — I4821 Permanent atrial fibrillation: Secondary | ICD-10-CM | POA: Diagnosis not present

## 2023-06-26 DIAGNOSIS — M069 Rheumatoid arthritis, unspecified: Secondary | ICD-10-CM | POA: Diagnosis not present

## 2023-06-26 DIAGNOSIS — M4800 Spinal stenosis, site unspecified: Secondary | ICD-10-CM | POA: Diagnosis not present

## 2023-06-26 DIAGNOSIS — Z466 Encounter for fitting and adjustment of urinary device: Secondary | ICD-10-CM | POA: Diagnosis not present

## 2023-06-26 DIAGNOSIS — G4730 Sleep apnea, unspecified: Secondary | ICD-10-CM | POA: Diagnosis not present

## 2023-06-26 DIAGNOSIS — Z7984 Long term (current) use of oral hypoglycemic drugs: Secondary | ICD-10-CM | POA: Diagnosis not present

## 2023-06-26 DIAGNOSIS — L03116 Cellulitis of left lower limb: Secondary | ICD-10-CM | POA: Diagnosis not present

## 2023-06-26 DIAGNOSIS — J449 Chronic obstructive pulmonary disease, unspecified: Secondary | ICD-10-CM | POA: Diagnosis not present

## 2023-06-26 DIAGNOSIS — Z7985 Long-term (current) use of injectable non-insulin antidiabetic drugs: Secondary | ICD-10-CM | POA: Diagnosis not present

## 2023-06-26 DIAGNOSIS — Z7901 Long term (current) use of anticoagulants: Secondary | ICD-10-CM | POA: Diagnosis not present

## 2023-06-26 DIAGNOSIS — D649 Anemia, unspecified: Secondary | ICD-10-CM | POA: Diagnosis not present

## 2023-06-26 DIAGNOSIS — M797 Fibromyalgia: Secondary | ICD-10-CM | POA: Diagnosis not present

## 2023-06-29 DIAGNOSIS — M069 Rheumatoid arthritis, unspecified: Secondary | ICD-10-CM | POA: Diagnosis not present

## 2023-06-29 DIAGNOSIS — I4821 Permanent atrial fibrillation: Secondary | ICD-10-CM | POA: Diagnosis not present

## 2023-06-29 DIAGNOSIS — D649 Anemia, unspecified: Secondary | ICD-10-CM | POA: Diagnosis not present

## 2023-06-29 DIAGNOSIS — J449 Chronic obstructive pulmonary disease, unspecified: Secondary | ICD-10-CM | POA: Diagnosis not present

## 2023-06-29 DIAGNOSIS — M4800 Spinal stenosis, site unspecified: Secondary | ICD-10-CM | POA: Diagnosis not present

## 2023-06-29 DIAGNOSIS — Z9181 History of falling: Secondary | ICD-10-CM | POA: Diagnosis not present

## 2023-06-29 DIAGNOSIS — Z466 Encounter for fitting and adjustment of urinary device: Secondary | ICD-10-CM | POA: Diagnosis not present

## 2023-06-29 DIAGNOSIS — Z7985 Long-term (current) use of injectable non-insulin antidiabetic drugs: Secondary | ICD-10-CM | POA: Diagnosis not present

## 2023-06-29 DIAGNOSIS — L03116 Cellulitis of left lower limb: Secondary | ICD-10-CM | POA: Diagnosis not present

## 2023-06-29 DIAGNOSIS — K3184 Gastroparesis: Secondary | ICD-10-CM | POA: Diagnosis not present

## 2023-06-29 DIAGNOSIS — E1142 Type 2 diabetes mellitus with diabetic polyneuropathy: Secondary | ICD-10-CM | POA: Diagnosis not present

## 2023-06-29 DIAGNOSIS — Z7901 Long term (current) use of anticoagulants: Secondary | ICD-10-CM | POA: Diagnosis not present

## 2023-06-29 DIAGNOSIS — N8110 Cystocele, unspecified: Secondary | ICD-10-CM | POA: Diagnosis not present

## 2023-06-29 DIAGNOSIS — I5032 Chronic diastolic (congestive) heart failure: Secondary | ICD-10-CM | POA: Diagnosis not present

## 2023-06-29 DIAGNOSIS — E1165 Type 2 diabetes mellitus with hyperglycemia: Secondary | ICD-10-CM | POA: Diagnosis not present

## 2023-06-29 DIAGNOSIS — E1143 Type 2 diabetes mellitus with diabetic autonomic (poly)neuropathy: Secondary | ICD-10-CM | POA: Diagnosis not present

## 2023-06-29 DIAGNOSIS — Z7984 Long term (current) use of oral hypoglycemic drugs: Secondary | ICD-10-CM | POA: Diagnosis not present

## 2023-06-29 DIAGNOSIS — G4730 Sleep apnea, unspecified: Secondary | ICD-10-CM | POA: Diagnosis not present

## 2023-06-29 DIAGNOSIS — M797 Fibromyalgia: Secondary | ICD-10-CM | POA: Diagnosis not present

## 2023-07-02 DIAGNOSIS — E1165 Type 2 diabetes mellitus with hyperglycemia: Secondary | ICD-10-CM | POA: Diagnosis not present

## 2023-07-03 DIAGNOSIS — E1165 Type 2 diabetes mellitus with hyperglycemia: Secondary | ICD-10-CM | POA: Diagnosis not present

## 2023-07-03 DIAGNOSIS — Z466 Encounter for fitting and adjustment of urinary device: Secondary | ICD-10-CM | POA: Diagnosis not present

## 2023-07-03 DIAGNOSIS — Z7901 Long term (current) use of anticoagulants: Secondary | ICD-10-CM | POA: Diagnosis not present

## 2023-07-03 DIAGNOSIS — Z7984 Long term (current) use of oral hypoglycemic drugs: Secondary | ICD-10-CM | POA: Diagnosis not present

## 2023-07-03 DIAGNOSIS — E1142 Type 2 diabetes mellitus with diabetic polyneuropathy: Secondary | ICD-10-CM | POA: Diagnosis not present

## 2023-07-03 DIAGNOSIS — L03116 Cellulitis of left lower limb: Secondary | ICD-10-CM | POA: Diagnosis not present

## 2023-07-03 DIAGNOSIS — Z7985 Long-term (current) use of injectable non-insulin antidiabetic drugs: Secondary | ICD-10-CM | POA: Diagnosis not present

## 2023-07-03 DIAGNOSIS — M069 Rheumatoid arthritis, unspecified: Secondary | ICD-10-CM | POA: Diagnosis not present

## 2023-07-03 DIAGNOSIS — Z9181 History of falling: Secondary | ICD-10-CM | POA: Diagnosis not present

## 2023-07-03 DIAGNOSIS — E1143 Type 2 diabetes mellitus with diabetic autonomic (poly)neuropathy: Secondary | ICD-10-CM | POA: Diagnosis not present

## 2023-07-03 DIAGNOSIS — D649 Anemia, unspecified: Secondary | ICD-10-CM | POA: Diagnosis not present

## 2023-07-03 DIAGNOSIS — N8110 Cystocele, unspecified: Secondary | ICD-10-CM | POA: Diagnosis not present

## 2023-07-03 DIAGNOSIS — M797 Fibromyalgia: Secondary | ICD-10-CM | POA: Diagnosis not present

## 2023-07-03 DIAGNOSIS — I4821 Permanent atrial fibrillation: Secondary | ICD-10-CM | POA: Diagnosis not present

## 2023-07-03 DIAGNOSIS — G4733 Obstructive sleep apnea (adult) (pediatric): Secondary | ICD-10-CM | POA: Diagnosis not present

## 2023-07-03 DIAGNOSIS — G4730 Sleep apnea, unspecified: Secondary | ICD-10-CM | POA: Diagnosis not present

## 2023-07-03 DIAGNOSIS — K3184 Gastroparesis: Secondary | ICD-10-CM | POA: Diagnosis not present

## 2023-07-03 DIAGNOSIS — M4800 Spinal stenosis, site unspecified: Secondary | ICD-10-CM | POA: Diagnosis not present

## 2023-07-03 DIAGNOSIS — I5032 Chronic diastolic (congestive) heart failure: Secondary | ICD-10-CM | POA: Diagnosis not present

## 2023-07-03 DIAGNOSIS — J449 Chronic obstructive pulmonary disease, unspecified: Secondary | ICD-10-CM | POA: Diagnosis not present

## 2023-07-04 DIAGNOSIS — G4733 Obstructive sleep apnea (adult) (pediatric): Secondary | ICD-10-CM | POA: Diagnosis not present

## 2023-07-04 DIAGNOSIS — J449 Chronic obstructive pulmonary disease, unspecified: Secondary | ICD-10-CM | POA: Diagnosis not present

## 2023-07-05 DIAGNOSIS — L03116 Cellulitis of left lower limb: Secondary | ICD-10-CM | POA: Diagnosis not present

## 2023-07-05 DIAGNOSIS — L03115 Cellulitis of right lower limb: Secondary | ICD-10-CM | POA: Diagnosis not present

## 2023-07-05 DIAGNOSIS — G4730 Sleep apnea, unspecified: Secondary | ICD-10-CM | POA: Diagnosis not present

## 2023-07-05 DIAGNOSIS — M797 Fibromyalgia: Secondary | ICD-10-CM | POA: Diagnosis not present

## 2023-07-05 DIAGNOSIS — Z9181 History of falling: Secondary | ICD-10-CM | POA: Diagnosis not present

## 2023-07-05 DIAGNOSIS — E1142 Type 2 diabetes mellitus with diabetic polyneuropathy: Secondary | ICD-10-CM | POA: Diagnosis not present

## 2023-07-05 DIAGNOSIS — Z7985 Long-term (current) use of injectable non-insulin antidiabetic drugs: Secondary | ICD-10-CM | POA: Diagnosis not present

## 2023-07-05 DIAGNOSIS — E1165 Type 2 diabetes mellitus with hyperglycemia: Secondary | ICD-10-CM | POA: Diagnosis not present

## 2023-07-05 DIAGNOSIS — K3184 Gastroparesis: Secondary | ICD-10-CM | POA: Diagnosis not present

## 2023-07-05 DIAGNOSIS — Z7984 Long term (current) use of oral hypoglycemic drugs: Secondary | ICD-10-CM | POA: Diagnosis not present

## 2023-07-05 DIAGNOSIS — D649 Anemia, unspecified: Secondary | ICD-10-CM | POA: Diagnosis not present

## 2023-07-05 DIAGNOSIS — I4821 Permanent atrial fibrillation: Secondary | ICD-10-CM | POA: Diagnosis not present

## 2023-07-05 DIAGNOSIS — J449 Chronic obstructive pulmonary disease, unspecified: Secondary | ICD-10-CM | POA: Diagnosis not present

## 2023-07-05 DIAGNOSIS — I5032 Chronic diastolic (congestive) heart failure: Secondary | ICD-10-CM | POA: Diagnosis not present

## 2023-07-05 DIAGNOSIS — M069 Rheumatoid arthritis, unspecified: Secondary | ICD-10-CM | POA: Diagnosis not present

## 2023-07-05 DIAGNOSIS — Z7901 Long term (current) use of anticoagulants: Secondary | ICD-10-CM | POA: Diagnosis not present

## 2023-07-05 DIAGNOSIS — Z466 Encounter for fitting and adjustment of urinary device: Secondary | ICD-10-CM | POA: Diagnosis not present

## 2023-07-05 DIAGNOSIS — M4800 Spinal stenosis, site unspecified: Secondary | ICD-10-CM | POA: Diagnosis not present

## 2023-07-05 DIAGNOSIS — N8110 Cystocele, unspecified: Secondary | ICD-10-CM | POA: Diagnosis not present

## 2023-07-05 DIAGNOSIS — E1143 Type 2 diabetes mellitus with diabetic autonomic (poly)neuropathy: Secondary | ICD-10-CM | POA: Diagnosis not present

## 2023-07-05 DIAGNOSIS — E11620 Type 2 diabetes mellitus with diabetic dermatitis: Secondary | ICD-10-CM | POA: Diagnosis not present

## 2023-07-10 DIAGNOSIS — Z7901 Long term (current) use of anticoagulants: Secondary | ICD-10-CM | POA: Diagnosis not present

## 2023-07-10 DIAGNOSIS — Z9181 History of falling: Secondary | ICD-10-CM | POA: Diagnosis not present

## 2023-07-10 DIAGNOSIS — E1165 Type 2 diabetes mellitus with hyperglycemia: Secondary | ICD-10-CM | POA: Diagnosis not present

## 2023-07-10 DIAGNOSIS — E1142 Type 2 diabetes mellitus with diabetic polyneuropathy: Secondary | ICD-10-CM | POA: Diagnosis not present

## 2023-07-10 DIAGNOSIS — Z7984 Long term (current) use of oral hypoglycemic drugs: Secondary | ICD-10-CM | POA: Diagnosis not present

## 2023-07-10 DIAGNOSIS — M069 Rheumatoid arthritis, unspecified: Secondary | ICD-10-CM | POA: Diagnosis not present

## 2023-07-10 DIAGNOSIS — M797 Fibromyalgia: Secondary | ICD-10-CM | POA: Diagnosis not present

## 2023-07-10 DIAGNOSIS — D649 Anemia, unspecified: Secondary | ICD-10-CM | POA: Diagnosis not present

## 2023-07-10 DIAGNOSIS — J449 Chronic obstructive pulmonary disease, unspecified: Secondary | ICD-10-CM | POA: Diagnosis not present

## 2023-07-10 DIAGNOSIS — K3184 Gastroparesis: Secondary | ICD-10-CM | POA: Diagnosis not present

## 2023-07-10 DIAGNOSIS — I5032 Chronic diastolic (congestive) heart failure: Secondary | ICD-10-CM | POA: Diagnosis not present

## 2023-07-10 DIAGNOSIS — Z466 Encounter for fitting and adjustment of urinary device: Secondary | ICD-10-CM | POA: Diagnosis not present

## 2023-07-10 DIAGNOSIS — L03116 Cellulitis of left lower limb: Secondary | ICD-10-CM | POA: Diagnosis not present

## 2023-07-10 DIAGNOSIS — M4800 Spinal stenosis, site unspecified: Secondary | ICD-10-CM | POA: Diagnosis not present

## 2023-07-10 DIAGNOSIS — Z7985 Long-term (current) use of injectable non-insulin antidiabetic drugs: Secondary | ICD-10-CM | POA: Diagnosis not present

## 2023-07-10 DIAGNOSIS — N8110 Cystocele, unspecified: Secondary | ICD-10-CM | POA: Diagnosis not present

## 2023-07-10 DIAGNOSIS — G4730 Sleep apnea, unspecified: Secondary | ICD-10-CM | POA: Diagnosis not present

## 2023-07-10 DIAGNOSIS — I4821 Permanent atrial fibrillation: Secondary | ICD-10-CM | POA: Diagnosis not present

## 2023-07-10 DIAGNOSIS — E1143 Type 2 diabetes mellitus with diabetic autonomic (poly)neuropathy: Secondary | ICD-10-CM | POA: Diagnosis not present

## 2023-07-11 DIAGNOSIS — S81802A Unspecified open wound, left lower leg, initial encounter: Secondary | ICD-10-CM | POA: Diagnosis not present

## 2023-07-13 DIAGNOSIS — E1142 Type 2 diabetes mellitus with diabetic polyneuropathy: Secondary | ICD-10-CM | POA: Diagnosis not present

## 2023-07-13 DIAGNOSIS — Z466 Encounter for fitting and adjustment of urinary device: Secondary | ICD-10-CM | POA: Diagnosis not present

## 2023-07-13 DIAGNOSIS — I5032 Chronic diastolic (congestive) heart failure: Secondary | ICD-10-CM | POA: Diagnosis not present

## 2023-07-13 DIAGNOSIS — I4821 Permanent atrial fibrillation: Secondary | ICD-10-CM | POA: Diagnosis not present

## 2023-07-13 DIAGNOSIS — Z9181 History of falling: Secondary | ICD-10-CM | POA: Diagnosis not present

## 2023-07-13 DIAGNOSIS — M069 Rheumatoid arthritis, unspecified: Secondary | ICD-10-CM | POA: Diagnosis not present

## 2023-07-13 DIAGNOSIS — J449 Chronic obstructive pulmonary disease, unspecified: Secondary | ICD-10-CM | POA: Diagnosis not present

## 2023-07-13 DIAGNOSIS — M4800 Spinal stenosis, site unspecified: Secondary | ICD-10-CM | POA: Diagnosis not present

## 2023-07-13 DIAGNOSIS — G4730 Sleep apnea, unspecified: Secondary | ICD-10-CM | POA: Diagnosis not present

## 2023-07-13 DIAGNOSIS — Z7984 Long term (current) use of oral hypoglycemic drugs: Secondary | ICD-10-CM | POA: Diagnosis not present

## 2023-07-13 DIAGNOSIS — E1165 Type 2 diabetes mellitus with hyperglycemia: Secondary | ICD-10-CM | POA: Diagnosis not present

## 2023-07-13 DIAGNOSIS — K3184 Gastroparesis: Secondary | ICD-10-CM | POA: Diagnosis not present

## 2023-07-13 DIAGNOSIS — Z7985 Long-term (current) use of injectable non-insulin antidiabetic drugs: Secondary | ICD-10-CM | POA: Diagnosis not present

## 2023-07-13 DIAGNOSIS — L03116 Cellulitis of left lower limb: Secondary | ICD-10-CM | POA: Diagnosis not present

## 2023-07-13 DIAGNOSIS — E1143 Type 2 diabetes mellitus with diabetic autonomic (poly)neuropathy: Secondary | ICD-10-CM | POA: Diagnosis not present

## 2023-07-13 DIAGNOSIS — N8110 Cystocele, unspecified: Secondary | ICD-10-CM | POA: Diagnosis not present

## 2023-07-13 DIAGNOSIS — D649 Anemia, unspecified: Secondary | ICD-10-CM | POA: Diagnosis not present

## 2023-07-13 DIAGNOSIS — Z7901 Long term (current) use of anticoagulants: Secondary | ICD-10-CM | POA: Diagnosis not present

## 2023-07-13 DIAGNOSIS — M797 Fibromyalgia: Secondary | ICD-10-CM | POA: Diagnosis not present

## 2023-07-16 DIAGNOSIS — I4821 Permanent atrial fibrillation: Secondary | ICD-10-CM | POA: Diagnosis not present

## 2023-07-16 DIAGNOSIS — E1142 Type 2 diabetes mellitus with diabetic polyneuropathy: Secondary | ICD-10-CM | POA: Diagnosis not present

## 2023-07-16 DIAGNOSIS — M069 Rheumatoid arthritis, unspecified: Secondary | ICD-10-CM | POA: Diagnosis not present

## 2023-07-16 DIAGNOSIS — J449 Chronic obstructive pulmonary disease, unspecified: Secondary | ICD-10-CM | POA: Diagnosis not present

## 2023-07-16 DIAGNOSIS — M4800 Spinal stenosis, site unspecified: Secondary | ICD-10-CM | POA: Diagnosis not present

## 2023-07-16 DIAGNOSIS — K3184 Gastroparesis: Secondary | ICD-10-CM | POA: Diagnosis not present

## 2023-07-16 DIAGNOSIS — E1165 Type 2 diabetes mellitus with hyperglycemia: Secondary | ICD-10-CM | POA: Diagnosis not present

## 2023-07-16 DIAGNOSIS — I5032 Chronic diastolic (congestive) heart failure: Secondary | ICD-10-CM | POA: Diagnosis not present

## 2023-07-16 DIAGNOSIS — Z9181 History of falling: Secondary | ICD-10-CM | POA: Diagnosis not present

## 2023-07-16 DIAGNOSIS — M797 Fibromyalgia: Secondary | ICD-10-CM | POA: Diagnosis not present

## 2023-07-16 DIAGNOSIS — E1143 Type 2 diabetes mellitus with diabetic autonomic (poly)neuropathy: Secondary | ICD-10-CM | POA: Diagnosis not present

## 2023-07-16 DIAGNOSIS — Z7901 Long term (current) use of anticoagulants: Secondary | ICD-10-CM | POA: Diagnosis not present

## 2023-07-16 DIAGNOSIS — Z466 Encounter for fitting and adjustment of urinary device: Secondary | ICD-10-CM | POA: Diagnosis not present

## 2023-07-16 DIAGNOSIS — D649 Anemia, unspecified: Secondary | ICD-10-CM | POA: Diagnosis not present

## 2023-07-16 DIAGNOSIS — L03116 Cellulitis of left lower limb: Secondary | ICD-10-CM | POA: Diagnosis not present

## 2023-07-16 DIAGNOSIS — R339 Retention of urine, unspecified: Secondary | ICD-10-CM | POA: Diagnosis not present

## 2023-07-16 DIAGNOSIS — G4730 Sleep apnea, unspecified: Secondary | ICD-10-CM | POA: Diagnosis not present

## 2023-07-16 DIAGNOSIS — Z7985 Long-term (current) use of injectable non-insulin antidiabetic drugs: Secondary | ICD-10-CM | POA: Diagnosis not present

## 2023-07-16 DIAGNOSIS — Z7984 Long term (current) use of oral hypoglycemic drugs: Secondary | ICD-10-CM | POA: Diagnosis not present

## 2023-07-16 DIAGNOSIS — N8110 Cystocele, unspecified: Secondary | ICD-10-CM | POA: Diagnosis not present

## 2023-07-17 DIAGNOSIS — E1142 Type 2 diabetes mellitus with diabetic polyneuropathy: Secondary | ICD-10-CM | POA: Diagnosis not present

## 2023-07-17 DIAGNOSIS — G894 Chronic pain syndrome: Secondary | ICD-10-CM | POA: Diagnosis not present

## 2023-07-17 DIAGNOSIS — M47816 Spondylosis without myelopathy or radiculopathy, lumbar region: Secondary | ICD-10-CM | POA: Diagnosis not present

## 2023-07-17 DIAGNOSIS — M48061 Spinal stenosis, lumbar region without neurogenic claudication: Secondary | ICD-10-CM | POA: Diagnosis not present

## 2023-07-19 DIAGNOSIS — Z87891 Personal history of nicotine dependence: Secondary | ICD-10-CM | POA: Diagnosis not present

## 2023-07-19 DIAGNOSIS — R600 Localized edema: Secondary | ICD-10-CM | POA: Diagnosis not present

## 2023-07-19 DIAGNOSIS — Z792 Long term (current) use of antibiotics: Secondary | ICD-10-CM | POA: Diagnosis not present

## 2023-07-19 DIAGNOSIS — Z791 Long term (current) use of non-steroidal anti-inflammatories (NSAID): Secondary | ICD-10-CM | POA: Diagnosis not present

## 2023-07-19 DIAGNOSIS — I4891 Unspecified atrial fibrillation: Secondary | ICD-10-CM | POA: Diagnosis not present

## 2023-07-19 DIAGNOSIS — I5030 Unspecified diastolic (congestive) heart failure: Secondary | ICD-10-CM | POA: Diagnosis not present

## 2023-07-19 DIAGNOSIS — G4733 Obstructive sleep apnea (adult) (pediatric): Secondary | ICD-10-CM | POA: Diagnosis not present

## 2023-07-19 DIAGNOSIS — E119 Type 2 diabetes mellitus without complications: Secondary | ICD-10-CM | POA: Diagnosis not present

## 2023-07-19 DIAGNOSIS — L97519 Non-pressure chronic ulcer of other part of right foot with unspecified severity: Secondary | ICD-10-CM | POA: Diagnosis not present

## 2023-07-19 DIAGNOSIS — Z9989 Dependence on other enabling machines and devices: Secondary | ICD-10-CM | POA: Diagnosis not present

## 2023-07-19 DIAGNOSIS — E11621 Type 2 diabetes mellitus with foot ulcer: Secondary | ICD-10-CM | POA: Diagnosis not present

## 2023-07-19 DIAGNOSIS — E109 Type 1 diabetes mellitus without complications: Secondary | ICD-10-CM | POA: Diagnosis not present

## 2023-07-19 DIAGNOSIS — J449 Chronic obstructive pulmonary disease, unspecified: Secondary | ICD-10-CM | POA: Diagnosis not present

## 2023-07-19 DIAGNOSIS — E11628 Type 2 diabetes mellitus with other skin complications: Secondary | ICD-10-CM | POA: Diagnosis not present

## 2023-07-19 DIAGNOSIS — J9601 Acute respiratory failure with hypoxia: Secondary | ICD-10-CM | POA: Diagnosis not present

## 2023-07-19 DIAGNOSIS — Z9981 Dependence on supplemental oxygen: Secondary | ICD-10-CM | POA: Diagnosis not present

## 2023-07-19 DIAGNOSIS — Z79891 Long term (current) use of opiate analgesic: Secondary | ICD-10-CM | POA: Diagnosis not present

## 2023-07-19 DIAGNOSIS — I509 Heart failure, unspecified: Secondary | ICD-10-CM | POA: Diagnosis not present

## 2023-07-19 DIAGNOSIS — L97529 Non-pressure chronic ulcer of other part of left foot with unspecified severity: Secondary | ICD-10-CM | POA: Diagnosis not present

## 2023-07-19 DIAGNOSIS — I071 Rheumatic tricuspid insufficiency: Secondary | ICD-10-CM | POA: Diagnosis not present

## 2023-07-19 DIAGNOSIS — Z7984 Long term (current) use of oral hypoglycemic drugs: Secondary | ICD-10-CM | POA: Diagnosis not present

## 2023-07-19 DIAGNOSIS — L02415 Cutaneous abscess of right lower limb: Secondary | ICD-10-CM | POA: Diagnosis not present

## 2023-07-19 DIAGNOSIS — I771 Stricture of artery: Secondary | ICD-10-CM | POA: Diagnosis not present

## 2023-07-19 DIAGNOSIS — I110 Hypertensive heart disease with heart failure: Secondary | ICD-10-CM | POA: Diagnosis not present

## 2023-07-19 DIAGNOSIS — I739 Peripheral vascular disease, unspecified: Secondary | ICD-10-CM | POA: Diagnosis not present

## 2023-07-19 DIAGNOSIS — M2011 Hallux valgus (acquired), right foot: Secondary | ICD-10-CM | POA: Diagnosis not present

## 2023-07-19 DIAGNOSIS — E1143 Type 2 diabetes mellitus with diabetic autonomic (poly)neuropathy: Secondary | ICD-10-CM | POA: Diagnosis not present

## 2023-07-19 DIAGNOSIS — M7731 Calcaneal spur, right foot: Secondary | ICD-10-CM | POA: Diagnosis not present

## 2023-07-19 DIAGNOSIS — K3184 Gastroparesis: Secondary | ICD-10-CM | POA: Diagnosis not present

## 2023-07-19 DIAGNOSIS — I2510 Atherosclerotic heart disease of native coronary artery without angina pectoris: Secondary | ICD-10-CM | POA: Diagnosis not present

## 2023-07-19 DIAGNOSIS — R0602 Shortness of breath: Secondary | ICD-10-CM | POA: Diagnosis not present

## 2023-07-19 DIAGNOSIS — M797 Fibromyalgia: Secondary | ICD-10-CM | POA: Diagnosis not present

## 2023-07-19 DIAGNOSIS — Z79899 Other long term (current) drug therapy: Secondary | ICD-10-CM | POA: Diagnosis not present

## 2023-07-19 DIAGNOSIS — M7989 Other specified soft tissue disorders: Secondary | ICD-10-CM | POA: Diagnosis not present

## 2023-07-19 DIAGNOSIS — L03116 Cellulitis of left lower limb: Secondary | ICD-10-CM | POA: Diagnosis not present

## 2023-07-19 DIAGNOSIS — Z7985 Long-term (current) use of injectable non-insulin antidiabetic drugs: Secondary | ICD-10-CM | POA: Diagnosis not present

## 2023-07-19 DIAGNOSIS — K2100 Gastro-esophageal reflux disease with esophagitis, without bleeding: Secondary | ICD-10-CM | POA: Diagnosis not present

## 2023-07-19 DIAGNOSIS — R6520 Severe sepsis without septic shock: Secondary | ICD-10-CM | POA: Diagnosis not present

## 2023-07-19 DIAGNOSIS — E1151 Type 2 diabetes mellitus with diabetic peripheral angiopathy without gangrene: Secondary | ICD-10-CM | POA: Diagnosis not present

## 2023-07-19 DIAGNOSIS — A419 Sepsis, unspecified organism: Secondary | ICD-10-CM | POA: Diagnosis not present

## 2023-07-19 DIAGNOSIS — I272 Pulmonary hypertension, unspecified: Secondary | ICD-10-CM | POA: Diagnosis not present

## 2023-07-19 DIAGNOSIS — I5033 Acute on chronic diastolic (congestive) heart failure: Secondary | ICD-10-CM | POA: Diagnosis not present

## 2023-07-19 DIAGNOSIS — Z7901 Long term (current) use of anticoagulants: Secondary | ICD-10-CM | POA: Diagnosis not present

## 2023-07-19 DIAGNOSIS — Z7952 Long term (current) use of systemic steroids: Secondary | ICD-10-CM | POA: Diagnosis not present

## 2023-07-19 DIAGNOSIS — R0600 Dyspnea, unspecified: Secondary | ICD-10-CM | POA: Diagnosis not present

## 2023-07-19 DIAGNOSIS — N289 Disorder of kidney and ureter, unspecified: Secondary | ICD-10-CM | POA: Diagnosis not present

## 2023-07-19 DIAGNOSIS — L03115 Cellulitis of right lower limb: Secondary | ICD-10-CM | POA: Diagnosis not present

## 2023-07-19 DIAGNOSIS — L089 Local infection of the skin and subcutaneous tissue, unspecified: Secondary | ICD-10-CM | POA: Diagnosis not present

## 2023-07-19 DIAGNOSIS — Z20822 Contact with and (suspected) exposure to covid-19: Secondary | ICD-10-CM | POA: Diagnosis not present

## 2023-07-19 DIAGNOSIS — I959 Hypotension, unspecified: Secondary | ICD-10-CM | POA: Diagnosis not present

## 2023-07-19 DIAGNOSIS — N179 Acute kidney failure, unspecified: Secondary | ICD-10-CM | POA: Diagnosis not present

## 2023-07-19 DIAGNOSIS — I10 Essential (primary) hypertension: Secondary | ICD-10-CM | POA: Diagnosis not present

## 2023-07-19 DIAGNOSIS — L03031 Cellulitis of right toe: Secondary | ICD-10-CM | POA: Diagnosis not present

## 2023-07-19 DIAGNOSIS — R609 Edema, unspecified: Secondary | ICD-10-CM | POA: Diagnosis not present

## 2023-07-19 DIAGNOSIS — E7800 Pure hypercholesterolemia, unspecified: Secondary | ICD-10-CM | POA: Diagnosis not present

## 2023-07-19 DIAGNOSIS — I480 Paroxysmal atrial fibrillation: Secondary | ICD-10-CM | POA: Diagnosis not present

## 2023-07-19 DIAGNOSIS — Z794 Long term (current) use of insulin: Secondary | ICD-10-CM | POA: Diagnosis not present

## 2023-07-20 ENCOUNTER — Other Ambulatory Visit: Payer: Self-pay | Admitting: Cardiology

## 2023-07-20 DIAGNOSIS — I480 Paroxysmal atrial fibrillation: Secondary | ICD-10-CM | POA: Diagnosis not present

## 2023-07-20 DIAGNOSIS — J449 Chronic obstructive pulmonary disease, unspecified: Secondary | ICD-10-CM | POA: Diagnosis not present

## 2023-07-20 DIAGNOSIS — I5033 Acute on chronic diastolic (congestive) heart failure: Secondary | ICD-10-CM | POA: Diagnosis not present

## 2023-07-24 DIAGNOSIS — M79675 Pain in left toe(s): Secondary | ICD-10-CM | POA: Diagnosis not present

## 2023-07-24 DIAGNOSIS — L84 Corns and callosities: Secondary | ICD-10-CM | POA: Diagnosis not present

## 2023-07-24 DIAGNOSIS — G4733 Obstructive sleep apnea (adult) (pediatric): Secondary | ICD-10-CM | POA: Diagnosis not present

## 2023-07-24 DIAGNOSIS — M79674 Pain in right toe(s): Secondary | ICD-10-CM | POA: Diagnosis not present

## 2023-07-24 DIAGNOSIS — E1142 Type 2 diabetes mellitus with diabetic polyneuropathy: Secondary | ICD-10-CM | POA: Diagnosis not present

## 2023-07-24 DIAGNOSIS — M1710 Unilateral primary osteoarthritis, unspecified knee: Secondary | ICD-10-CM | POA: Diagnosis not present

## 2023-07-24 DIAGNOSIS — B351 Tinea unguium: Secondary | ICD-10-CM | POA: Diagnosis not present

## 2023-07-24 DIAGNOSIS — J449 Chronic obstructive pulmonary disease, unspecified: Secondary | ICD-10-CM | POA: Diagnosis not present

## 2023-07-25 DIAGNOSIS — G4730 Sleep apnea, unspecified: Secondary | ICD-10-CM | POA: Diagnosis not present

## 2023-07-25 DIAGNOSIS — D649 Anemia, unspecified: Secondary | ICD-10-CM | POA: Diagnosis not present

## 2023-07-25 DIAGNOSIS — L03116 Cellulitis of left lower limb: Secondary | ICD-10-CM | POA: Diagnosis not present

## 2023-07-25 DIAGNOSIS — M797 Fibromyalgia: Secondary | ICD-10-CM | POA: Diagnosis not present

## 2023-07-25 DIAGNOSIS — M4800 Spinal stenosis, site unspecified: Secondary | ICD-10-CM | POA: Diagnosis not present

## 2023-07-25 DIAGNOSIS — Z7985 Long-term (current) use of injectable non-insulin antidiabetic drugs: Secondary | ICD-10-CM | POA: Diagnosis not present

## 2023-07-25 DIAGNOSIS — Z466 Encounter for fitting and adjustment of urinary device: Secondary | ICD-10-CM | POA: Diagnosis not present

## 2023-07-25 DIAGNOSIS — Z9181 History of falling: Secondary | ICD-10-CM | POA: Diagnosis not present

## 2023-07-25 DIAGNOSIS — N8110 Cystocele, unspecified: Secondary | ICD-10-CM | POA: Diagnosis not present

## 2023-07-25 DIAGNOSIS — Z7984 Long term (current) use of oral hypoglycemic drugs: Secondary | ICD-10-CM | POA: Diagnosis not present

## 2023-07-25 DIAGNOSIS — Z7901 Long term (current) use of anticoagulants: Secondary | ICD-10-CM | POA: Diagnosis not present

## 2023-07-25 DIAGNOSIS — K3184 Gastroparesis: Secondary | ICD-10-CM | POA: Diagnosis not present

## 2023-07-25 DIAGNOSIS — I5032 Chronic diastolic (congestive) heart failure: Secondary | ICD-10-CM | POA: Diagnosis not present

## 2023-07-25 DIAGNOSIS — I4821 Permanent atrial fibrillation: Secondary | ICD-10-CM | POA: Diagnosis not present

## 2023-07-25 DIAGNOSIS — M069 Rheumatoid arthritis, unspecified: Secondary | ICD-10-CM | POA: Diagnosis not present

## 2023-07-25 DIAGNOSIS — E1142 Type 2 diabetes mellitus with diabetic polyneuropathy: Secondary | ICD-10-CM | POA: Diagnosis not present

## 2023-07-25 DIAGNOSIS — E1143 Type 2 diabetes mellitus with diabetic autonomic (poly)neuropathy: Secondary | ICD-10-CM | POA: Diagnosis not present

## 2023-07-25 DIAGNOSIS — E1165 Type 2 diabetes mellitus with hyperglycemia: Secondary | ICD-10-CM | POA: Diagnosis not present

## 2023-07-25 DIAGNOSIS — J449 Chronic obstructive pulmonary disease, unspecified: Secondary | ICD-10-CM | POA: Diagnosis not present

## 2023-07-31 DIAGNOSIS — Z09 Encounter for follow-up examination after completed treatment for conditions other than malignant neoplasm: Secondary | ICD-10-CM | POA: Diagnosis not present

## 2023-07-31 DIAGNOSIS — I10 Essential (primary) hypertension: Secondary | ICD-10-CM | POA: Diagnosis not present

## 2023-07-31 DIAGNOSIS — Z299 Encounter for prophylactic measures, unspecified: Secondary | ICD-10-CM | POA: Diagnosis not present

## 2023-07-31 DIAGNOSIS — I5032 Chronic diastolic (congestive) heart failure: Secondary | ICD-10-CM | POA: Diagnosis not present

## 2023-08-02 ENCOUNTER — Ambulatory Visit: Attending: Nurse Practitioner | Admitting: Nurse Practitioner

## 2023-08-02 ENCOUNTER — Encounter: Payer: Self-pay | Admitting: Nurse Practitioner

## 2023-08-02 VITALS — BP 116/64 | HR 61 | Ht 67.0 in | Wt 336.0 lb

## 2023-08-02 DIAGNOSIS — L0390 Cellulitis, unspecified: Secondary | ICD-10-CM | POA: Diagnosis not present

## 2023-08-02 DIAGNOSIS — Z9181 History of falling: Secondary | ICD-10-CM | POA: Diagnosis not present

## 2023-08-02 DIAGNOSIS — J449 Chronic obstructive pulmonary disease, unspecified: Secondary | ICD-10-CM | POA: Diagnosis not present

## 2023-08-02 DIAGNOSIS — I5032 Chronic diastolic (congestive) heart failure: Secondary | ICD-10-CM | POA: Diagnosis not present

## 2023-08-02 DIAGNOSIS — E1143 Type 2 diabetes mellitus with diabetic autonomic (poly)neuropathy: Secondary | ICD-10-CM | POA: Diagnosis not present

## 2023-08-02 DIAGNOSIS — E1165 Type 2 diabetes mellitus with hyperglycemia: Secondary | ICD-10-CM | POA: Diagnosis not present

## 2023-08-02 DIAGNOSIS — E1142 Type 2 diabetes mellitus with diabetic polyneuropathy: Secondary | ICD-10-CM | POA: Diagnosis not present

## 2023-08-02 DIAGNOSIS — Z7985 Long-term (current) use of injectable non-insulin antidiabetic drugs: Secondary | ICD-10-CM | POA: Diagnosis not present

## 2023-08-02 DIAGNOSIS — Z7984 Long term (current) use of oral hypoglycemic drugs: Secondary | ICD-10-CM | POA: Diagnosis not present

## 2023-08-02 DIAGNOSIS — M069 Rheumatoid arthritis, unspecified: Secondary | ICD-10-CM | POA: Diagnosis not present

## 2023-08-02 DIAGNOSIS — K3184 Gastroparesis: Secondary | ICD-10-CM | POA: Diagnosis not present

## 2023-08-02 DIAGNOSIS — Z466 Encounter for fitting and adjustment of urinary device: Secondary | ICD-10-CM | POA: Diagnosis not present

## 2023-08-02 DIAGNOSIS — I4821 Permanent atrial fibrillation: Secondary | ICD-10-CM | POA: Diagnosis not present

## 2023-08-02 DIAGNOSIS — D649 Anemia, unspecified: Secondary | ICD-10-CM | POA: Diagnosis not present

## 2023-08-02 DIAGNOSIS — I509 Heart failure, unspecified: Secondary | ICD-10-CM

## 2023-08-02 DIAGNOSIS — S81802D Unspecified open wound, left lower leg, subsequent encounter: Secondary | ICD-10-CM

## 2023-08-02 DIAGNOSIS — G4730 Sleep apnea, unspecified: Secondary | ICD-10-CM | POA: Diagnosis not present

## 2023-08-02 DIAGNOSIS — M4800 Spinal stenosis, site unspecified: Secondary | ICD-10-CM | POA: Diagnosis not present

## 2023-08-02 DIAGNOSIS — I10 Essential (primary) hypertension: Secondary | ICD-10-CM | POA: Diagnosis not present

## 2023-08-02 DIAGNOSIS — I480 Paroxysmal atrial fibrillation: Secondary | ICD-10-CM

## 2023-08-02 DIAGNOSIS — N8110 Cystocele, unspecified: Secondary | ICD-10-CM | POA: Diagnosis not present

## 2023-08-02 DIAGNOSIS — L03116 Cellulitis of left lower limb: Secondary | ICD-10-CM | POA: Diagnosis not present

## 2023-08-02 DIAGNOSIS — M797 Fibromyalgia: Secondary | ICD-10-CM | POA: Diagnosis not present

## 2023-08-02 DIAGNOSIS — S81802A Unspecified open wound, left lower leg, initial encounter: Secondary | ICD-10-CM

## 2023-08-02 DIAGNOSIS — G4733 Obstructive sleep apnea (adult) (pediatric): Secondary | ICD-10-CM

## 2023-08-02 DIAGNOSIS — Z7901 Long term (current) use of anticoagulants: Secondary | ICD-10-CM | POA: Diagnosis not present

## 2023-08-02 MED ORDER — TORSEMIDE 40 MG PO TABS: 40.0000 mg | ORAL_TABLET | Freq: Two times a day (BID) | ORAL | 1 refills | Status: DC

## 2023-08-02 MED ORDER — METOLAZONE 2.5 MG PO TABS: 2.5000 mg | ORAL_TABLET | Freq: Every day | ORAL | 1 refills | Status: DC

## 2023-08-02 NOTE — Progress Notes (Unsigned)
 Cardiology Office Note:    Date:  08/02/2023 ID:  [name] [name], DOB June 15, 1960, MRN [id number] PCP:  Rosamond Leta NOVAK, MD [facility] Providers Cardiologist:  Jayson Sierras, MD    Referring MD: Rosamond Leta NOVAK, MD   CC: Here for hospital follow-up  History of Present Illness:    [name] [name] is a 63 y.o. female with a PMH of HFpEF, hx of hypoxic respiratory failure, PAF, HTN, T2DM, PAD, chronic lymphedema and recurrent cellultis, COPD, DOE, OSA, hx of TIA, vertigo, and morbid obesity, who presents today for hospital follow-up.   Last seen by Dr. Sierras on July 10, 2022.  Was overall doing well from a cardiac perspective at the time.  Hospitalized from May 02, 2023 to May 04, 2023 due to cellulitis of left lower extremity.  Hospital course as noted by hyperkalemia that improved after Lokelma .  Recent hospital admission at [facility] with chief complaint of leg pain/swelling.  She is being treated by home health for left lower extremity wound with complaints of right lower extremity pain along with redness.  Sought medical attention and also complained of nausea.  She was febrile, not tachycardic.  She was given IV Lasix  as well as antibiotics.  X-ray was negative for osteomyelitis.  Wound care was arranged.  Was recommended follow-up with podiatry and vascular/vein at discharge.  She received IV Lasix  due to acute on chronic HFpEF during hospital stay, hospital course also noted by acute hypoxic respiratory failure. ABI's found to be abnormal with bilateral ABIs were found to be moderately to severely abnormal with bilateral TBI's to be mildly abnormal, evidence of wounds along lower legs.  Mentioned about referral to vascular/vein.  Today she presents for hospital follow-up. Admits to worsening swelling along her LLE despite being compliant with her medications, has finished ABX today and says she is starting to get early signs of cellulitis, believes her ABX she has been on in  the past haven't seemed to help. She is concerned she will have to go back to the hospital again for cellultis. Will be having upcoming visit with [facility] nurse who will be doing wound care to LLE. Will also be getting future CBC and BMET drawn at this visit. Once wound is tx, she will then be able to get treated at the lymphedema clinic according to her report. Tells me her weight has been going up. Denies any chest pain, worsening shortness of breath, palpitations, syncope, presyncope, dizziness, orthopnea, PND, acute bleeding, or claudication.  Please see the history of present illness.    All other systems reviewed and are negative.  EKGs/Labs/Other Studies Reviewed:    The following studies were reviewed today:   EKG:  EKG is not ordered today.    PVL ABI 07/2023 [facility]): Final Impression    * The bilateral ABIs are moderate-severely abnormal.    * The bilateral TBIs are mildly abnormal.    ABI Measurements  RIGHT Brachial Pressure (mmHg): 124  RIGHT DP Pressure (mmHg): 91  RIGHT PTA Pressure (mmHg): 81  LEFT Brachial Pressure (mmHg): 126  LEFT DP Pressure (mmHg): 88  LEFT PTA Pressure (mmHg): 94   RIGHT ABI Ratio: 0.72  LEFT ABI Ratio: 0.75   TBI Measurements  RIGHT Great Toe Pressure (mmHg): 76  LEFT Great Toe Pressure (mmHg): 71   RIGHT TBI Ratio: 0.60  LEFT TBI Ratio: 0.56  PVL venous duplex lower extremity bilateral 07/2023 [facility]):  IMPRESSION:   1. Negative examination of the deep venous  system of  both lower extremities. There is no evidence of deep vein thrombosis.   2. Prominent lymph node with normal morphology in the right inguinal region noted. This likely represents a reactive lymph node.  Echo 07/2023 [facility]):  Summary   1. The left ventricle is mildly dilated in size with mildly increased wall  thickness.   2. The left ventricular systolic function is normal, LVEF is visually  estimated at > 55%.    3. There is grade I diastolic dysfunction (impaired  relaxation).    4. Aortic sclerosis.    5. The right ventricle is normal in size, with normal systolic function.    Right and left heart cath on August 28, 2018: The left ventricular systolic function is normal. The left ventricular ejection fraction is 55-65% by visual estimate.   1. No angiographic evidence of CAD 2. Mildly elevated left ventricular filling pressures 3. Dyspnea likely multifactorial due to COPD, obesity and physical deconditioning with chronic diastolic CHF   No further ischemic workup.   Recent Labs: 05/02/2023: B Natriuretic Peptide 13.0 05/18/2023: ALT 13; BUN 40; Creatinine, Ser 1.06; Hemoglobin 9.6; Platelets 336; Potassium 4.5; Sodium 134  Recent Lipid Panel No results found for: CHOL, TRIG, HDL, CHOLHDL, VLDL, LDLCALC, LDLDIRECT   Physical Exam:    VS:  BP 116/64 (BP Location: Right Wrist)   Pulse 61   Ht 5' 7 (1.702 m)   Wt (!) 336 lb (152.4 kg)   SpO2 95%   BMI 52.63 kg/m     Wt Readings from Last 3 Encounters:  08/02/23 (!) 336 lb (152.4 kg)  05/18/23 (!) 325 lb (147.4 kg)  05/03/23 (!) 321 lb 10.4 oz (145.9 kg)     GEN: Morbidly obese, 63 y.o. female in no acute distress, sitting in wheelchair HEENT: Normal NECK: No JVD; No carotid bruits CARDIAC: S1/S2, irregular rate and rhythm, Grade 2/6 systolic murmur noted, no rubs or gallops noted; 2+ radial pulses, 1+ PT. RESPIRATORY:  Clear to auscultation without rales, wheezing or rhonchi  MUSCULOSKELETAL:  Generalized, nonpitting edema, chronic lymphedema (LLE > RLE) SKIN: Warm and dry, slight redness to LLE, minimal redness to RLE, pink wound to LLE (slough noted).  NEUROLOGIC:  Alert and oriented x 3 PSYCHIATRIC:  Normal, pleasant affect    ASSESSMENT & PLAN:    In order of problems listed above:  HFpEF Stage C, NYHA class I-II symptoms.  EF greater than 55%.  Does show increased leg edema as well as weights have been increased recently.  Will stop Lasix  and switch to  torsemide  40 mg twice daily.  Did instruct patient to take metolazone  30 minutes before diuretic.  Tells me she is currently taking this 30 minutes after diuretic.  She verbalized understanding of how to take her medications.  Will obtain proBNP and magnesium  in addition to her already requested CBC and BMP to be drawn with home health.  She is not a candidate for SGLT2 inhibitor due to her history of recurrent cellulitis.  No other medication changes at this time. Low sodium diet, fluid restriction <2L, and daily weights encouraged. Educated to contact our office for weight gain of 2 lbs overnight or 5 lbs in one week.  PAF Denies any tachycardia or palpitations.  Continue diltiazem  and flecainide . Denies any bleeding issues on Xarelto .  Continue Xarelto  20 mg daily. Heart healthy diet and regular cardiovascular exercise as tolerated encouraged.    HTN BP stable today.  BP well-controlled at home.  Continue current  medication regimen. Discussed to monitor BP at home at least 2 hours after medications and sitting for 5-10 minutes. Heart healthy diet and regular cardiovascular exercise as tolerated encouraged.   COPD, OSA treated with BiPAP Denies any acute exacerbations or worsening symptoms.  Encouraged continued compliance with BiPAP.  Continue current medication regimen and continue to follow-up with PCP.  Morbid obesity Weight loss via diet and exercise as tolerated encouraged. Discussed the impact being overweight would have on cardiovascular risk.  Recurrent cellulitis, open wound Patient had just completed p.o. antibiotics today.  She is showing some early signs of cellulitis even after finishing antibiotics today.  She gets this often.  Will refer to infectious disease for further evaluation.  Wonder about possible MRSA/antibiotic resistance.  Follow-up with home health for wound care treatments.   Disposition: Care and ED precautions discussed.  Follow-up with MD/APP in 4 weeks or sooner  if anything changes.   Medication Adjustments/Labs and Tests Ordered: Current medicines are reviewed at length with the patient today.  Concerns regarding medicines are outlined above.  Orders Placed This Encounter  Procedures   Brain natriuretic peptide   Magnesium    Ambulatory referral to Infectious Disease   Meds ordered this encounter  Medications   metolazone  (ZAROXOLYN ) 2.5 MG tablet    Sig: Take 1 tablet (2.5 mg total) by mouth daily. 30 Minutes before taking your torsemide .    Dispense:  90 tablet    Refill:  1    Dose change 08/02/23   torsemide  40 MG TABS    Sig: Take 40 mg by mouth 2 (two) times daily.    Dispense:  180 tablet    Refill:  1    Stopping lasix  08/02/23    Patient Instructions  Medication Instructions:  Your physician has recommended you make the following change in your medication:  Please stop Lasix   Please Start Torsemide  40 Mg Twice daily  Please take 2.5 Mg Metolazone  30 Minutes before torsemide .   Labwork: BNP and MAG with your other labs for next week   Testing/Procedures: None   Follow-Up: Your physician recommends that you schedule a follow-up appointment in: 4 weeks   Any Other Special Instructions Will Be Listed Below (If Applicable).  If you need a refill on your cardiac medications before your next appointment, please call your pharmacy.    Signed, Almarie Crate, NP

## 2023-08-02 NOTE — Patient Instructions (Addendum)
 Medication Instructions:  Your physician has recommended you make the following change in your medication:  Please stop Lasix   Please Start Torsemide  40 Mg Twice daily  Please take 2.5 Mg Metolazone  30 Minutes before torsemide .   Labwork: BNP and MAG with your other labs for next week   Testing/Procedures: None   Follow-Up: Your physician recommends that you schedule a follow-up appointment in: 4 weeks   Any Other Special Instructions Will Be Listed Below (If Applicable).  If you need a refill on your cardiac medications before your next appointment, please call your pharmacy.

## 2023-08-03 ENCOUNTER — Telehealth: Payer: Self-pay | Admitting: Nurse Practitioner

## 2023-08-03 DIAGNOSIS — G4733 Obstructive sleep apnea (adult) (pediatric): Secondary | ICD-10-CM | POA: Diagnosis not present

## 2023-08-03 DIAGNOSIS — J449 Chronic obstructive pulmonary disease, unspecified: Secondary | ICD-10-CM | POA: Diagnosis not present

## 2023-08-03 NOTE — Telephone Encounter (Signed)
 E.Peck acknowledged message I secure chat.

## 2023-08-03 NOTE — Telephone Encounter (Signed)
 Secure chat sent to E.Miriam

## 2023-08-03 NOTE — Telephone Encounter (Signed)
 Margaret calling from [facility]. She received an urgent referral from E Peck. She said the first available is 08/22/23. She said if you need her seen before that you can call Dr Lindia he is the DOD but the office closes at 12 today

## 2023-08-06 DIAGNOSIS — G4730 Sleep apnea, unspecified: Secondary | ICD-10-CM | POA: Diagnosis not present

## 2023-08-06 DIAGNOSIS — E1165 Type 2 diabetes mellitus with hyperglycemia: Secondary | ICD-10-CM | POA: Diagnosis not present

## 2023-08-06 DIAGNOSIS — J9601 Acute respiratory failure with hypoxia: Secondary | ICD-10-CM | POA: Diagnosis not present

## 2023-08-06 DIAGNOSIS — I5033 Acute on chronic diastolic (congestive) heart failure: Secondary | ICD-10-CM | POA: Diagnosis not present

## 2023-08-06 DIAGNOSIS — E11620 Type 2 diabetes mellitus with diabetic dermatitis: Secondary | ICD-10-CM | POA: Diagnosis not present

## 2023-08-06 DIAGNOSIS — L97828 Non-pressure chronic ulcer of other part of left lower leg with other specified severity: Secondary | ICD-10-CM | POA: Diagnosis not present

## 2023-08-06 DIAGNOSIS — E1142 Type 2 diabetes mellitus with diabetic polyneuropathy: Secondary | ICD-10-CM | POA: Diagnosis not present

## 2023-08-06 DIAGNOSIS — I890 Lymphedema, not elsewhere classified: Secondary | ICD-10-CM | POA: Diagnosis not present

## 2023-08-06 DIAGNOSIS — I4821 Permanent atrial fibrillation: Secondary | ICD-10-CM | POA: Diagnosis not present

## 2023-08-06 DIAGNOSIS — L03115 Cellulitis of right lower limb: Secondary | ICD-10-CM | POA: Diagnosis not present

## 2023-08-06 DIAGNOSIS — J449 Chronic obstructive pulmonary disease, unspecified: Secondary | ICD-10-CM | POA: Diagnosis not present

## 2023-08-06 DIAGNOSIS — D649 Anemia, unspecified: Secondary | ICD-10-CM | POA: Diagnosis not present

## 2023-08-06 DIAGNOSIS — K3184 Gastroparesis: Secondary | ICD-10-CM | POA: Diagnosis not present

## 2023-08-06 DIAGNOSIS — L03116 Cellulitis of left lower limb: Secondary | ICD-10-CM | POA: Diagnosis not present

## 2023-08-06 DIAGNOSIS — M069 Rheumatoid arthritis, unspecified: Secondary | ICD-10-CM | POA: Diagnosis not present

## 2023-08-06 DIAGNOSIS — M4800 Spinal stenosis, site unspecified: Secondary | ICD-10-CM | POA: Diagnosis not present

## 2023-08-06 DIAGNOSIS — M797 Fibromyalgia: Secondary | ICD-10-CM | POA: Diagnosis not present

## 2023-08-06 DIAGNOSIS — S90934D Unspecified superficial injury of right lesser toe(s), subsequent encounter: Secondary | ICD-10-CM | POA: Diagnosis not present

## 2023-08-06 DIAGNOSIS — I110 Hypertensive heart disease with heart failure: Secondary | ICD-10-CM | POA: Diagnosis not present

## 2023-08-06 DIAGNOSIS — N8110 Cystocele, unspecified: Secondary | ICD-10-CM | POA: Diagnosis not present

## 2023-08-06 DIAGNOSIS — E1143 Type 2 diabetes mellitus with diabetic autonomic (poly)neuropathy: Secondary | ICD-10-CM | POA: Diagnosis not present

## 2023-08-08 DIAGNOSIS — L03116 Cellulitis of left lower limb: Secondary | ICD-10-CM | POA: Diagnosis not present

## 2023-08-08 DIAGNOSIS — J9601 Acute respiratory failure with hypoxia: Secondary | ICD-10-CM | POA: Diagnosis not present

## 2023-08-08 DIAGNOSIS — G4730 Sleep apnea, unspecified: Secondary | ICD-10-CM | POA: Diagnosis not present

## 2023-08-08 DIAGNOSIS — L97828 Non-pressure chronic ulcer of other part of left lower leg with other specified severity: Secondary | ICD-10-CM | POA: Diagnosis not present

## 2023-08-08 DIAGNOSIS — S90934D Unspecified superficial injury of right lesser toe(s), subsequent encounter: Secondary | ICD-10-CM | POA: Diagnosis not present

## 2023-08-08 DIAGNOSIS — E1165 Type 2 diabetes mellitus with hyperglycemia: Secondary | ICD-10-CM | POA: Diagnosis not present

## 2023-08-08 DIAGNOSIS — J449 Chronic obstructive pulmonary disease, unspecified: Secondary | ICD-10-CM | POA: Diagnosis not present

## 2023-08-08 DIAGNOSIS — I4821 Permanent atrial fibrillation: Secondary | ICD-10-CM | POA: Diagnosis not present

## 2023-08-08 DIAGNOSIS — K3184 Gastroparesis: Secondary | ICD-10-CM | POA: Diagnosis not present

## 2023-08-08 DIAGNOSIS — L03115 Cellulitis of right lower limb: Secondary | ICD-10-CM | POA: Diagnosis not present

## 2023-08-08 DIAGNOSIS — E1143 Type 2 diabetes mellitus with diabetic autonomic (poly)neuropathy: Secondary | ICD-10-CM | POA: Diagnosis not present

## 2023-08-08 DIAGNOSIS — I5033 Acute on chronic diastolic (congestive) heart failure: Secondary | ICD-10-CM | POA: Diagnosis not present

## 2023-08-08 DIAGNOSIS — M4800 Spinal stenosis, site unspecified: Secondary | ICD-10-CM | POA: Diagnosis not present

## 2023-08-08 DIAGNOSIS — I890 Lymphedema, not elsewhere classified: Secondary | ICD-10-CM | POA: Diagnosis not present

## 2023-08-08 DIAGNOSIS — N8110 Cystocele, unspecified: Secondary | ICD-10-CM | POA: Diagnosis not present

## 2023-08-08 DIAGNOSIS — M069 Rheumatoid arthritis, unspecified: Secondary | ICD-10-CM | POA: Diagnosis not present

## 2023-08-08 DIAGNOSIS — E11620 Type 2 diabetes mellitus with diabetic dermatitis: Secondary | ICD-10-CM | POA: Diagnosis not present

## 2023-08-08 DIAGNOSIS — E1142 Type 2 diabetes mellitus with diabetic polyneuropathy: Secondary | ICD-10-CM | POA: Diagnosis not present

## 2023-08-08 DIAGNOSIS — M797 Fibromyalgia: Secondary | ICD-10-CM | POA: Diagnosis not present

## 2023-08-08 DIAGNOSIS — I110 Hypertensive heart disease with heart failure: Secondary | ICD-10-CM | POA: Diagnosis not present

## 2023-08-08 DIAGNOSIS — D649 Anemia, unspecified: Secondary | ICD-10-CM | POA: Diagnosis not present

## 2023-08-10 DIAGNOSIS — L03116 Cellulitis of left lower limb: Secondary | ICD-10-CM | POA: Diagnosis not present

## 2023-08-10 DIAGNOSIS — L03115 Cellulitis of right lower limb: Secondary | ICD-10-CM | POA: Diagnosis not present

## 2023-08-10 DIAGNOSIS — E11620 Type 2 diabetes mellitus with diabetic dermatitis: Secondary | ICD-10-CM | POA: Diagnosis not present

## 2023-08-10 DIAGNOSIS — L97828 Non-pressure chronic ulcer of other part of left lower leg with other specified severity: Secondary | ICD-10-CM | POA: Diagnosis not present

## 2023-08-12 DIAGNOSIS — E1165 Type 2 diabetes mellitus with hyperglycemia: Secondary | ICD-10-CM | POA: Diagnosis not present

## 2023-08-12 DIAGNOSIS — E11620 Type 2 diabetes mellitus with diabetic dermatitis: Secondary | ICD-10-CM | POA: Diagnosis not present

## 2023-08-12 DIAGNOSIS — D649 Anemia, unspecified: Secondary | ICD-10-CM | POA: Diagnosis not present

## 2023-08-12 DIAGNOSIS — E1142 Type 2 diabetes mellitus with diabetic polyneuropathy: Secondary | ICD-10-CM | POA: Diagnosis not present

## 2023-08-12 DIAGNOSIS — M797 Fibromyalgia: Secondary | ICD-10-CM | POA: Diagnosis not present

## 2023-08-12 DIAGNOSIS — I4821 Permanent atrial fibrillation: Secondary | ICD-10-CM | POA: Diagnosis not present

## 2023-08-12 DIAGNOSIS — I5033 Acute on chronic diastolic (congestive) heart failure: Secondary | ICD-10-CM | POA: Diagnosis not present

## 2023-08-12 DIAGNOSIS — K3184 Gastroparesis: Secondary | ICD-10-CM | POA: Diagnosis not present

## 2023-08-12 DIAGNOSIS — E1143 Type 2 diabetes mellitus with diabetic autonomic (poly)neuropathy: Secondary | ICD-10-CM | POA: Diagnosis not present

## 2023-08-12 DIAGNOSIS — J449 Chronic obstructive pulmonary disease, unspecified: Secondary | ICD-10-CM | POA: Diagnosis not present

## 2023-08-12 DIAGNOSIS — L03115 Cellulitis of right lower limb: Secondary | ICD-10-CM | POA: Diagnosis not present

## 2023-08-12 DIAGNOSIS — L03116 Cellulitis of left lower limb: Secondary | ICD-10-CM | POA: Diagnosis not present

## 2023-08-12 DIAGNOSIS — M069 Rheumatoid arthritis, unspecified: Secondary | ICD-10-CM | POA: Diagnosis not present

## 2023-08-12 DIAGNOSIS — I890 Lymphedema, not elsewhere classified: Secondary | ICD-10-CM | POA: Diagnosis not present

## 2023-08-12 DIAGNOSIS — S90934D Unspecified superficial injury of right lesser toe(s), subsequent encounter: Secondary | ICD-10-CM | POA: Diagnosis not present

## 2023-08-12 DIAGNOSIS — L97828 Non-pressure chronic ulcer of other part of left lower leg with other specified severity: Secondary | ICD-10-CM | POA: Diagnosis not present

## 2023-08-12 DIAGNOSIS — M4800 Spinal stenosis, site unspecified: Secondary | ICD-10-CM | POA: Diagnosis not present

## 2023-08-12 DIAGNOSIS — N8110 Cystocele, unspecified: Secondary | ICD-10-CM | POA: Diagnosis not present

## 2023-08-12 DIAGNOSIS — J9601 Acute respiratory failure with hypoxia: Secondary | ICD-10-CM | POA: Diagnosis not present

## 2023-08-12 DIAGNOSIS — I110 Hypertensive heart disease with heart failure: Secondary | ICD-10-CM | POA: Diagnosis not present

## 2023-08-12 DIAGNOSIS — G4730 Sleep apnea, unspecified: Secondary | ICD-10-CM | POA: Diagnosis not present

## 2023-08-14 DIAGNOSIS — M48061 Spinal stenosis, lumbar region without neurogenic claudication: Secondary | ICD-10-CM | POA: Diagnosis not present

## 2023-08-14 DIAGNOSIS — M47816 Spondylosis without myelopathy or radiculopathy, lumbar region: Secondary | ICD-10-CM | POA: Diagnosis not present

## 2023-08-14 DIAGNOSIS — G894 Chronic pain syndrome: Secondary | ICD-10-CM | POA: Diagnosis not present

## 2023-08-14 DIAGNOSIS — R339 Retention of urine, unspecified: Secondary | ICD-10-CM | POA: Diagnosis not present

## 2023-08-14 DIAGNOSIS — E1142 Type 2 diabetes mellitus with diabetic polyneuropathy: Secondary | ICD-10-CM | POA: Diagnosis not present

## 2023-08-16 DIAGNOSIS — L03116 Cellulitis of left lower limb: Secondary | ICD-10-CM | POA: Diagnosis not present

## 2023-08-16 DIAGNOSIS — I5033 Acute on chronic diastolic (congestive) heart failure: Secondary | ICD-10-CM | POA: Diagnosis not present

## 2023-08-16 DIAGNOSIS — E1165 Type 2 diabetes mellitus with hyperglycemia: Secondary | ICD-10-CM | POA: Diagnosis not present

## 2023-08-16 DIAGNOSIS — M069 Rheumatoid arthritis, unspecified: Secondary | ICD-10-CM | POA: Diagnosis not present

## 2023-08-16 DIAGNOSIS — I890 Lymphedema, not elsewhere classified: Secondary | ICD-10-CM | POA: Diagnosis not present

## 2023-08-16 DIAGNOSIS — E1143 Type 2 diabetes mellitus with diabetic autonomic (poly)neuropathy: Secondary | ICD-10-CM | POA: Diagnosis not present

## 2023-08-16 DIAGNOSIS — I4821 Permanent atrial fibrillation: Secondary | ICD-10-CM | POA: Diagnosis not present

## 2023-08-16 DIAGNOSIS — J449 Chronic obstructive pulmonary disease, unspecified: Secondary | ICD-10-CM | POA: Diagnosis not present

## 2023-08-16 DIAGNOSIS — D649 Anemia, unspecified: Secondary | ICD-10-CM | POA: Diagnosis not present

## 2023-08-16 DIAGNOSIS — M797 Fibromyalgia: Secondary | ICD-10-CM | POA: Diagnosis not present

## 2023-08-16 DIAGNOSIS — K3184 Gastroparesis: Secondary | ICD-10-CM | POA: Diagnosis not present

## 2023-08-16 DIAGNOSIS — J9601 Acute respiratory failure with hypoxia: Secondary | ICD-10-CM | POA: Diagnosis not present

## 2023-08-16 DIAGNOSIS — I110 Hypertensive heart disease with heart failure: Secondary | ICD-10-CM | POA: Diagnosis not present

## 2023-08-16 DIAGNOSIS — L03115 Cellulitis of right lower limb: Secondary | ICD-10-CM | POA: Diagnosis not present

## 2023-08-16 DIAGNOSIS — G4730 Sleep apnea, unspecified: Secondary | ICD-10-CM | POA: Diagnosis not present

## 2023-08-16 DIAGNOSIS — N8110 Cystocele, unspecified: Secondary | ICD-10-CM | POA: Diagnosis not present

## 2023-08-16 DIAGNOSIS — M4800 Spinal stenosis, site unspecified: Secondary | ICD-10-CM | POA: Diagnosis not present

## 2023-08-16 DIAGNOSIS — E11620 Type 2 diabetes mellitus with diabetic dermatitis: Secondary | ICD-10-CM | POA: Diagnosis not present

## 2023-08-16 DIAGNOSIS — L97828 Non-pressure chronic ulcer of other part of left lower leg with other specified severity: Secondary | ICD-10-CM | POA: Diagnosis not present

## 2023-08-16 DIAGNOSIS — S90934D Unspecified superficial injury of right lesser toe(s), subsequent encounter: Secondary | ICD-10-CM | POA: Diagnosis not present

## 2023-08-16 DIAGNOSIS — E1142 Type 2 diabetes mellitus with diabetic polyneuropathy: Secondary | ICD-10-CM | POA: Diagnosis not present

## 2023-08-17 DIAGNOSIS — Z299 Encounter for prophylactic measures, unspecified: Secondary | ICD-10-CM | POA: Diagnosis not present

## 2023-08-17 DIAGNOSIS — I5032 Chronic diastolic (congestive) heart failure: Secondary | ICD-10-CM | POA: Diagnosis not present

## 2023-08-17 DIAGNOSIS — L0390 Cellulitis, unspecified: Secondary | ICD-10-CM | POA: Diagnosis not present

## 2023-08-17 DIAGNOSIS — R52 Pain, unspecified: Secondary | ICD-10-CM | POA: Diagnosis not present

## 2023-08-17 DIAGNOSIS — E119 Type 2 diabetes mellitus without complications: Secondary | ICD-10-CM | POA: Diagnosis not present

## 2023-08-17 DIAGNOSIS — I10 Essential (primary) hypertension: Secondary | ICD-10-CM | POA: Diagnosis not present

## 2023-08-20 ENCOUNTER — Encounter: Payer: Self-pay | Admitting: Internal Medicine

## 2023-08-21 DIAGNOSIS — L97828 Non-pressure chronic ulcer of other part of left lower leg with other specified severity: Secondary | ICD-10-CM | POA: Diagnosis not present

## 2023-08-21 DIAGNOSIS — J449 Chronic obstructive pulmonary disease, unspecified: Secondary | ICD-10-CM | POA: Diagnosis not present

## 2023-08-21 DIAGNOSIS — L03115 Cellulitis of right lower limb: Secondary | ICD-10-CM | POA: Diagnosis not present

## 2023-08-21 DIAGNOSIS — I5033 Acute on chronic diastolic (congestive) heart failure: Secondary | ICD-10-CM | POA: Diagnosis not present

## 2023-08-21 DIAGNOSIS — I110 Hypertensive heart disease with heart failure: Secondary | ICD-10-CM | POA: Diagnosis not present

## 2023-08-21 DIAGNOSIS — N8110 Cystocele, unspecified: Secondary | ICD-10-CM | POA: Diagnosis not present

## 2023-08-21 DIAGNOSIS — L03116 Cellulitis of left lower limb: Secondary | ICD-10-CM | POA: Diagnosis not present

## 2023-08-21 DIAGNOSIS — S90934D Unspecified superficial injury of right lesser toe(s), subsequent encounter: Secondary | ICD-10-CM | POA: Diagnosis not present

## 2023-08-21 DIAGNOSIS — M4800 Spinal stenosis, site unspecified: Secondary | ICD-10-CM | POA: Diagnosis not present

## 2023-08-21 DIAGNOSIS — E1142 Type 2 diabetes mellitus with diabetic polyneuropathy: Secondary | ICD-10-CM | POA: Diagnosis not present

## 2023-08-21 DIAGNOSIS — I890 Lymphedema, not elsewhere classified: Secondary | ICD-10-CM | POA: Diagnosis not present

## 2023-08-21 DIAGNOSIS — K3184 Gastroparesis: Secondary | ICD-10-CM | POA: Diagnosis not present

## 2023-08-21 DIAGNOSIS — D649 Anemia, unspecified: Secondary | ICD-10-CM | POA: Diagnosis not present

## 2023-08-21 DIAGNOSIS — E1165 Type 2 diabetes mellitus with hyperglycemia: Secondary | ICD-10-CM | POA: Diagnosis not present

## 2023-08-21 DIAGNOSIS — M797 Fibromyalgia: Secondary | ICD-10-CM | POA: Diagnosis not present

## 2023-08-21 DIAGNOSIS — E11620 Type 2 diabetes mellitus with diabetic dermatitis: Secondary | ICD-10-CM | POA: Diagnosis not present

## 2023-08-21 DIAGNOSIS — M069 Rheumatoid arthritis, unspecified: Secondary | ICD-10-CM | POA: Diagnosis not present

## 2023-08-21 DIAGNOSIS — E1143 Type 2 diabetes mellitus with diabetic autonomic (poly)neuropathy: Secondary | ICD-10-CM | POA: Diagnosis not present

## 2023-08-21 DIAGNOSIS — J9601 Acute respiratory failure with hypoxia: Secondary | ICD-10-CM | POA: Diagnosis not present

## 2023-08-21 DIAGNOSIS — G4730 Sleep apnea, unspecified: Secondary | ICD-10-CM | POA: Diagnosis not present

## 2023-08-21 DIAGNOSIS — I4821 Permanent atrial fibrillation: Secondary | ICD-10-CM | POA: Diagnosis not present

## 2023-08-23 ENCOUNTER — Encounter: Payer: Self-pay | Admitting: Internal Medicine

## 2023-08-23 ENCOUNTER — Ambulatory Visit (INDEPENDENT_AMBULATORY_CARE_PROVIDER_SITE_OTHER): Admitting: Internal Medicine

## 2023-08-23 ENCOUNTER — Other Ambulatory Visit: Payer: Self-pay

## 2023-08-23 VITALS — BP 99/62 | HR 70 | Temp 98.0°F | Ht 67.0 in

## 2023-08-23 DIAGNOSIS — I890 Lymphedema, not elsewhere classified: Secondary | ICD-10-CM | POA: Diagnosis not present

## 2023-08-23 MED ORDER — DOXYCYCLINE HYCLATE 100 MG PO TABS: 100.0000 mg | ORAL_TABLET | Freq: Two times a day (BID) | ORAL | 0 refills | Status: AC

## 2023-08-23 MED ORDER — CEFADROXIL 500 MG PO CAPS: 1000.0000 mg | ORAL_CAPSULE | Freq: Two times a day (BID) | ORAL | 0 refills | Status: AC

## 2023-08-23 NOTE — Patient Instructions (Addendum)
-  If signs of cellulitis (erythema/pain) start cefadroxil  and doxy x 7 days -Referred to wound care->lymph clinic -f/u vascular surgery

## 2023-08-23 NOTE — Progress Notes (Signed)
 Patient: [name] [name]  DOB: 01/17/1960 MRN: [id number] PCP: Rosamond Leta NOVAK, MD    Chief Complaint  Patient presents with   New Patient (Initial Visit)     Patient Active Problem List   Diagnosis Date Noted   Hyperkalemia 05/02/2023   Cellulitis of left lower extremity 05/02/2023   DM (diabetes mellitus) (HCC) 05/02/2023   Chronic pain 05/02/2023   Polypharmacy 05/02/2023   Dyspnea on exertion    Therapeutic opioid-induced constipation (OIC) 01/30/2018   Diarrhea 10/24/2017   Taking multiple medications for chronic disease 11/01/2016   History of adenomatous polyp of colon 11/01/2016   Chest pain 09/15/2015   Fibromyalgia    Chronic obstructive pulmonary disease (HCC)    Constipation 08/16/2015   Nausea without vomiting 03/25/2015   GERD (gastroesophageal reflux disease) 03/25/2015   Depression 01/05/2015   Cervical spondylosis with radiculopathy 12/02/2014   Rectal bleeding 08/22/2013   History of melena 06/05/2013   Irritable bowel syndrome 06/05/2013   Precordial pain 02/06/2012   Paroxysmal atrial fibrillation (HCC) 09/28/2011   Essential hypertension, benign 09/28/2011   Lymphadenopathy 08/17/2011   Leukocytosis 08/04/2011   Retroperitoneal lymphadenopathy 08/04/2011   Hematuria 07/13/2011   Dyspareunia in female 01/12/2011   Frequency of urination and polyuria 01/12/2011   Incomplete emptying of bladder 01/12/2011   Obstructive sleep apnea 10/02/2007   Gastroparesis due to DM (HCC) 10/02/2007     Subjective:  ANNALEISE BURGER is a 63 year old female with past medical history of heart failure preserved ejection, A-fib, lymphedema, chronic blood lower extremity ulcer, IDDM, hypertension hyperlipidemia, COPD hospitalized at [facility] for right lower extremity cellulitis, right fifth toe infection/ulceration.  No signs of osteo seen.  Initially treated with Vanco and Zosyn changed to cefepime and then Doxy and cefdinir x 10 days at discharge.  She was  referred by PCP forcellulitis.  She had completed antibiotics on 7/31.  Review of Systems  All other systems reviewed and are negative.   Past Medical History:  Diagnosis Date   Anemia    Bladder dysfunction    Self urinary catheterization   Chronic pain syndrome    long term and current opiates   Complication of anesthesia    COPD (chronic obstructive pulmonary disease) (HCC)    DDD (degenerative disc disease)    Depression    Dysrhythmia    Bradycardia, A.fib   Esophageal spasm    NTG and Norvasc   Essential hypertension    Fibromyalgia    Gastroparesis    GERD (gastroesophageal reflux disease)    History of cardiac catheterization    Normal coronary arteries   History of pituitary tumor    Irritable bowel syndrome    Lymphedema    Osteoarthritis    PAF (paroxysmal atrial fibrillation) (HCC)    Pneumonia    PONV (postoperative nausea and vomiting)    Psoriatic arthritis (HCC)    Rheumatoid arthritis (HCC)    Sleep apnea    BIPAP   Type 2 diabetes mellitus (HCC)    Urinary tract infection     Outpatient Medications Prior to Visit  Medication Sig Dispense Refill   albuterol  (VENTOLIN  HFA) 108 (90 Base) MCG/ACT inhaler Inhale 2 puffs into the lungs.     atorvastatin  (LIPITOR) 40 MG tablet Take 40 mg by mouth daily.     baclofen  (LIORESAL ) 20 MG tablet Take 20 mg by mouth 3 (three) times daily.     CARTIA  XT 120 MG 24 hr capsule TAKE ONE  CAPSULE BY MOUTH EVERY DAY 90 capsule 3   DEXILANT  60 MG capsule TAKE ONE CAPSULE BY MOUTH EVERY DAY 90 capsule 3   dexlansoprazole  (DEXILANT ) 60 MG capsule Take 1 capsule (60 mg total) by mouth daily. 90 capsule 3   diclofenac sodium (VOLTAREN) 1 % GEL Apply 2-4 g topically 4 (four) times daily as needed (for pain).      docusate sodium  (COLACE) 100 MG capsule Take 100 mg by mouth 2 (two) times daily.     ferrous sulfate 325 (65 FE) MG EC tablet Take 325 mg by mouth daily with breakfast.     flecainide  (TAMBOCOR ) 50 MG tablet TAKE  2 TABLETS BY MOUTH EVERY MORNING and TAKE 2 TABLETS BY MOUTH EVERY EVENING 120 tablet 1   fluticasone  (FLONASE ) 50 MCG/ACT nasal spray INSTILL 2 SPRAYS IN EACH NOSTRIL EVERY DAY 16 g 6   gabapentin  (NEURONTIN ) 800 MG tablet Take 800 mg by mouth 4 (four) times daily.   2   glimepiride (AMARYL) 4 MG tablet Take 4 mg by mouth 2 (two) times daily.     hydrocortisone  (ANUSOL -HC) 2.5 % rectal cream Place 1 Application rectally 2 (two) times daily. For rectal bleeding 30 g 1   isosorbide  mononitrate (IMDUR ) 120 MG 24 hr tablet Take 120 mg by mouth daily. In the morning     isosorbide  mononitrate (IMDUR ) 30 MG 24 hr tablet Take 30 mg by mouth every evening.     linaclotide  (LINZESS ) 290 MCG CAPS capsule Take 1 capsule (290 mcg total) by mouth daily before breakfast. 90 capsule 3   lisinopril  (PRINIVIL ,ZESTRIL ) 10 MG tablet Take 10 mg by mouth every evening.      loratadine  (CLARITIN ) 10 MG tablet Take 1 tablet (10 mg total) by mouth daily. 30 tablet 11   meclizine  (ANTIVERT ) 25 MG tablet Take 25 mg by mouth 3 (three) times daily.     metFORMIN  (GLUCOPHAGE ) 500 MG tablet Take 500-1,000 mg by mouth 2 (two) times daily. Take 1,000mg  in the Morning and 500 mg in the evening     metolazone  (ZAROXOLYN ) 2.5 MG tablet Take 1 tablet (2.5 mg total) by mouth daily. 30 Minutes before taking your torsemide . 90 tablet 1   Multiple Vitamin (MULTIVITAMIN WITH MINERALS) TABS tablet Take 1 tablet by mouth daily.     mupirocin ointment (BACTROBAN) 2 % Apply 1 Application topically in the morning and at bedtime.     MYRBETRIQ 50 MG TB24 tablet Take 50 mg by mouth daily. NOT TAKING AT THIS TIME. 05-18-23 (Patient not taking: Reported on 08/02/2023)     nitroGLYCERIN  (NITROLINGUAL ) 0.4 MG/SPRAY spray USE 1 SPRAY UNDER THE TONGUE EVERY 5 MINUTES UP TO 3 DOSES AS NEEDED FOR CHEST PAIN. GO TO ER IF NOT RESOLVED 12 g 3   NUCYNTA  ER 100 MG 12 hr tablet Take 100 mg by mouth 2 (two) times daily.     nystatin  (MYCOSTATIN /NYSTOP ) 100000  UNIT/GM POWD Apply 1 g topically daily.     ondansetron  (ZOFRAN -ODT) 8 MG disintegrating tablet Take 8 mg by mouth daily.     OTEZLA  30 MG TABS Take 1 tablet by mouth 2 (two) times daily.     oxyCODONE  (OXYCONTIN ) 40 mg 12 hr tablet Take 40 mg by mouth every 12 (twelve) hours. (Patient not taking: Reported on 08/02/2023)     oxyCODONE -acetaminophen  (PERCOCET) 10-325 MG per tablet Take 1 tablet by mouth every 4 (four) hours as needed for pain.     OXYGEN  Inhale 2 L into  the lungs as needed (shortness of breath).     OZEMPIC, 1 MG/DOSE, 4 MG/3ML SOPN Inject 1 mg into the skin every Tuesday. (Patient not taking: Reported on 08/02/2023)     Prucalopride Succinate  (MOTEGRITY ) 2 MG TABS Take 1 tablet (2 mg total) by mouth daily. 90 tablet 3   rivaroxaban  (XARELTO ) 20 MG TABS tablet Take 20 mg by mouth daily with supper.      sodium chloride  (OCEAN) 0.65 % SOLN nasal spray Place 1 spray into both nostrils daily.     spironolactone  (ALDACTONE ) 50 MG tablet Take 50 mg by mouth 3 (three) times daily.     torsemide  40 MG TABS Take 40 mg by mouth 2 (two) times daily. 180 tablet 1   TOUJEO  SOLOSTAR 300 UNIT/ML Solostar Pen Inject 43 Units into the skin at bedtime.     umeclidinium-vilanterol (ANORO ELLIPTA ) 62.5-25 MCG/ACT AEPB Inhale 1 puff into the lungs daily. 60 each 11   VASCEPA  1 g CAPS Take 1 g by mouth 4 (four) times daily.     No facility-administered medications prior to visit.     Allergies  Allergen Reactions   Levofloxacin Other (See Comments)    Psychosis   Tape Other (See Comments)    Welts, bad place on skin. Paper tape is okay   Wound Dressing Adhesive Hives    Welts, bad place on skin. Paper tape is okay   Hyoscyamine Sulfate Palpitations   Metoclopramide Other (See Comments)    Tremors     Social History   Tobacco Use   Smoking status: Former    Current packs/day: 0.00    Average packs/day: 1.5 packs/day for 30.0 years (45.0 ttl pk-yrs)    Types: Cigarettes    Start date:  03/06/1977    Quit date: 01/03/2007    Years since quitting: 16.6   Smokeless tobacco: Never  Vaping Use   Vaping status: Never Used  Substance Use Topics   Alcohol  use: No    Alcohol /week: 0.0 standard drinks of alcohol    Drug use: No    Family History  Problem Relation Age of Onset   Coronary artery disease Father        Died age 45   Heart attack Father    Arrhythmia Father        AF   Diabetes Father    Parkinson's disease Father    Arrhythmia Mother        AF   Stroke Mother    Dementia Mother    Cancer Mother        UTERINE    Parkinson's disease Mother    Arrhythmia Brother        AF   Colon cancer Maternal Grandfather    Colon cancer Paternal Aunt    Colon cancer Paternal Aunt    Diabetes Son    Cancer Sister        BREAST    Depression Sister    Breast cancer Sister    Depression Sister    Depression Sister    Breast cancer Maternal Aunt     Objective:  There were no vitals filed for this visit. There is no height or weight on file to calculate BMI.  Physical Exam Constitutional:      Appearance: Normal appearance.  HENT:     Head: Normocephalic and atraumatic.     Right Ear: Tympanic membrane normal.     Left Ear: Tympanic membrane normal.     Nose: Nose normal.  Mouth/Throat:     Mouth: Mucous membranes are moist.  Eyes:     Extraocular Movements: Extraocular movements intact.     Conjunctiva/sclera: Conjunctivae normal.     Pupils: Pupils are equal, round, and reactive to light.  Cardiovascular:     Rate and Rhythm: Normal rate and regular rhythm.     Heart sounds: No murmur heard.    No friction rub. No gallop.  Pulmonary:     Effort: Pulmonary effort is normal.     Breath sounds: Normal breath sounds.  Abdominal:     General: Abdomen is flat.     Palpations: Abdomen is soft.  Skin:    General: Skin is warm and dry.  Neurological:     General: No focal deficit present.     Mental Status: She is alert and oriented to person, place,  and time.  Psychiatric:        Mood and Affect: Mood normal.     Lab Results: Lab Results  Component Value Date   WBC 11.5 (H) 05/18/2023   HGB 9.6 (L) 05/18/2023   HCT 30.8 (L) 05/18/2023   MCV 92.2 05/18/2023   PLT 336 05/18/2023    Lab Results  Component Value Date   CREATININE 1.06 (H) 05/18/2023   BUN 40 (H) 05/18/2023   NA 134 (L) 05/18/2023   K 4.5 05/18/2023   CL 99 05/18/2023   CO2 26 05/18/2023    Lab Results  Component Value Date   ALT 13 05/18/2023   AST 16 05/18/2023   ALKPHOS 63 05/18/2023   BILITOT 0.5 05/18/2023     Assessment & Plan:  63 year old female with heart failure preserved ejection fraction, A-fib, chronic left lower extremity ulcer with lymphedema, IDDM, hypertension hyperlipidemia, recent hospitalization [phone] at [facility] for right lower extremity/ cellulitis. #History of bilateral lower extremity cellulitis in the setting of lymphedema and chronic ulcers =She had presented with 1 day history of right lower extremity swelling/erythema.  Workup included x-ray of right foot which showed no evidence of osteo-.  General surgery was consulted recommended dressing change for chroni left  lower extremity wounds.  She was treated with vancomycin  then Zosyn to cefepime discharged on Doxy and cefadroxil  to complete a total of 10 days. -  She was seen by primary care on 7/31 on last day of antibiotics with noted left lower extremity early cellulitis and referred to infectious disease. -US  on 7/18 showed bilateral ABIs are moderate to severely abnormal, bilateral TBI's are mildly abnormal. She was seen in 2020 with vascular surgery and had undergone laser ablation of left great saphenous vein with Dr. Eliza. Plan: - No concerns today - Follow-up with vascular surgery for underlying lymphedema.(Sees Dr. Carolann on 9/2) - If she does develop signs of cellulitis recc doxycycline  cefadroxil  x 7 days(sent rx and counseled to contact clinc) -Referral  Lymph clinic -F/U with ID PRN Loney Stank, MD [facility]   08/23/23  3:18 PM  I have personally spent 65 minutes involved in face-to-face and non-face-to-face activities for this patient on the day of the visit. Professional time spent includes the following activities: Preparing to see the patient (review of tests), Obtaining and/or reviewing separately obtained history (admission/discharge record), Performing a medically appropriate examination and/or evaluation , Ordering medications/tests/procedures, referring and communicating with other health care professionals, Documenting clinical information in the EMR, Independently interpreting results (not separately reported), Communicating results to the patient/family/caregiver, Counseling and educating the patient/family/caregiver  and Care coordination (not separately reported).

## 2023-08-30 DIAGNOSIS — K3184 Gastroparesis: Secondary | ICD-10-CM | POA: Diagnosis not present

## 2023-08-30 DIAGNOSIS — L03116 Cellulitis of left lower limb: Secondary | ICD-10-CM | POA: Diagnosis not present

## 2023-08-30 DIAGNOSIS — L97828 Non-pressure chronic ulcer of other part of left lower leg with other specified severity: Secondary | ICD-10-CM | POA: Diagnosis not present

## 2023-08-30 DIAGNOSIS — N8110 Cystocele, unspecified: Secondary | ICD-10-CM | POA: Diagnosis not present

## 2023-08-30 DIAGNOSIS — E1143 Type 2 diabetes mellitus with diabetic autonomic (poly)neuropathy: Secondary | ICD-10-CM | POA: Diagnosis not present

## 2023-08-30 DIAGNOSIS — I5033 Acute on chronic diastolic (congestive) heart failure: Secondary | ICD-10-CM | POA: Diagnosis not present

## 2023-08-30 DIAGNOSIS — I110 Hypertensive heart disease with heart failure: Secondary | ICD-10-CM | POA: Diagnosis not present

## 2023-08-30 DIAGNOSIS — M4800 Spinal stenosis, site unspecified: Secondary | ICD-10-CM | POA: Diagnosis not present

## 2023-08-30 DIAGNOSIS — G4730 Sleep apnea, unspecified: Secondary | ICD-10-CM | POA: Diagnosis not present

## 2023-08-30 DIAGNOSIS — M797 Fibromyalgia: Secondary | ICD-10-CM | POA: Diagnosis not present

## 2023-08-30 DIAGNOSIS — E1142 Type 2 diabetes mellitus with diabetic polyneuropathy: Secondary | ICD-10-CM | POA: Diagnosis not present

## 2023-08-30 DIAGNOSIS — J449 Chronic obstructive pulmonary disease, unspecified: Secondary | ICD-10-CM | POA: Diagnosis not present

## 2023-08-30 DIAGNOSIS — S90934D Unspecified superficial injury of right lesser toe(s), subsequent encounter: Secondary | ICD-10-CM | POA: Diagnosis not present

## 2023-08-30 DIAGNOSIS — I890 Lymphedema, not elsewhere classified: Secondary | ICD-10-CM | POA: Diagnosis not present

## 2023-08-30 DIAGNOSIS — E1165 Type 2 diabetes mellitus with hyperglycemia: Secondary | ICD-10-CM | POA: Diagnosis not present

## 2023-08-30 DIAGNOSIS — J9601 Acute respiratory failure with hypoxia: Secondary | ICD-10-CM | POA: Diagnosis not present

## 2023-08-30 DIAGNOSIS — I4821 Permanent atrial fibrillation: Secondary | ICD-10-CM | POA: Diagnosis not present

## 2023-08-30 DIAGNOSIS — M069 Rheumatoid arthritis, unspecified: Secondary | ICD-10-CM | POA: Diagnosis not present

## 2023-08-30 DIAGNOSIS — L03115 Cellulitis of right lower limb: Secondary | ICD-10-CM | POA: Diagnosis not present

## 2023-08-30 DIAGNOSIS — E11620 Type 2 diabetes mellitus with diabetic dermatitis: Secondary | ICD-10-CM | POA: Diagnosis not present

## 2023-08-30 DIAGNOSIS — D649 Anemia, unspecified: Secondary | ICD-10-CM | POA: Diagnosis not present

## 2023-08-31 ENCOUNTER — Ambulatory Visit: Attending: Nurse Practitioner | Admitting: Nurse Practitioner

## 2023-08-31 ENCOUNTER — Encounter: Payer: Self-pay | Admitting: Nurse Practitioner

## 2023-08-31 VITALS — BP 132/64 | HR 86 | Ht 67.0 in | Wt 331.0 lb

## 2023-08-31 DIAGNOSIS — L0390 Cellulitis, unspecified: Secondary | ICD-10-CM

## 2023-08-31 DIAGNOSIS — S81802A Unspecified open wound, left lower leg, initial encounter: Secondary | ICD-10-CM

## 2023-08-31 DIAGNOSIS — R0602 Shortness of breath: Secondary | ICD-10-CM | POA: Diagnosis not present

## 2023-08-31 DIAGNOSIS — I5032 Chronic diastolic (congestive) heart failure: Secondary | ICD-10-CM

## 2023-08-31 DIAGNOSIS — G4733 Obstructive sleep apnea (adult) (pediatric): Secondary | ICD-10-CM | POA: Diagnosis not present

## 2023-08-31 DIAGNOSIS — D649 Anemia, unspecified: Secondary | ICD-10-CM

## 2023-08-31 DIAGNOSIS — T148XXD Other injury of unspecified body region, subsequent encounter: Secondary | ICD-10-CM

## 2023-08-31 DIAGNOSIS — N1831 Chronic kidney disease, stage 3a: Secondary | ICD-10-CM | POA: Diagnosis not present

## 2023-08-31 DIAGNOSIS — I480 Paroxysmal atrial fibrillation: Secondary | ICD-10-CM | POA: Diagnosis not present

## 2023-08-31 DIAGNOSIS — R079 Chest pain, unspecified: Secondary | ICD-10-CM | POA: Diagnosis not present

## 2023-08-31 DIAGNOSIS — I10 Essential (primary) hypertension: Secondary | ICD-10-CM | POA: Diagnosis not present

## 2023-08-31 DIAGNOSIS — J449 Chronic obstructive pulmonary disease, unspecified: Secondary | ICD-10-CM

## 2023-08-31 DIAGNOSIS — N1832 Chronic kidney disease, stage 3b: Secondary | ICD-10-CM

## 2023-08-31 DIAGNOSIS — T148XXA Other injury of unspecified body region, initial encounter: Secondary | ICD-10-CM

## 2023-08-31 NOTE — Progress Notes (Addendum)
 Cardiology Office Note:    Date:  08/31/2023 ID:  AMRY CATHY, DOB [birth year], MRN [id number] PCP:  Rosamond Leta NOVAK, MD [facility] Providers Cardiologist:  Jayson Sierras, MD    Referring MD: Rosamond Leta NOVAK, MD   CC: Here for scheduled follow-up  History of Present Illness:    YULANDA DIGGS is a 63 y.o. female with a PMH of HFpEF, hx of hypoxic respiratory failure, PAF, HTN, T2DM, PAD, chronic lymphedema and recurrent cellultis, COPD, DOE, OSA, hx of TIA, vertigo, and morbid obesity, who presents today for hospital follow-up.   Last seen by Dr. Sierras on July 10, 2022.  Was overall doing well from a cardiac perspective at the time.  Hospitalized from May 02, 2023 to May 04, 2023 due to cellulitis of left lower extremity.  Hospital course as noted by hyperkalemia that improved after Lokelma .  Recent hospital admission at [facility] with chief complaint of leg pain/swelling.  She is being treated by home health for left lower extremity wound with complaints of right lower extremity pain along with redness.  Sought medical attention and also complained of nausea.  She was febrile, not tachycardic.  She was given IV Lasix  as well as antibiotics.  X-ray was negative for osteomyelitis.  Wound care was arranged.  Was recommended follow-up with podiatry and vascular/vein at discharge.  She received IV Lasix  due to acute on chronic HFpEF during hospital stay, hospital course also noted by acute hypoxic respiratory failure. ABI's found to be abnormal with bilateral ABIs were found to be moderately to severely abnormal with bilateral TBI's to be mildly abnormal, evidence of wounds along lower legs.  Mentioned about referral to vascular/vein.  08/02/2023 - Today she presents for hospital follow-up. Admits to worsening swelling along her LLE despite being compliant with her medications, has finished ABX today and says she is starting to get early signs of cellulitis, believes her ABX she  has been on in the past haven't seemed to help. She is concerned she will have to go back to the hospital again for cellultis. Will be having upcoming visit with [facility] nurse who will be doing wound care to LLE. Will also be getting future CBC and BMET drawn at this visit. Once wound is tx, she will then be able to get treated at the lymphedema clinic according to her report. Tells me her weight has been going up. Denies any chest pain, worsening shortness of breath, palpitations, syncope, presyncope, dizziness, orthopnea, PND, acute bleeding, or claudication.  08/31/2023 - Here for follow-up. She admits to improvement in swelling, says fluid pill has helped. RN is wrapping her LLE per her report. Was given a Rx of ABX to take PRN for cellulitis.  Tells me she is taking nitroglycerin  more lately, taking 1 every other day.  Denies any specific triggers or is exertional component to her pain.  Admits to right sided chest pain that goes along her jaw, says feels like things are caving in. Denies any overt bleeding issues, but admits to hemorrhoids every once in a while. Has noticed shortness of breath and labile HR's noted on Smart Watch (ranging from lowest HR in 40's to HR 130's). Denies any syncope, presyncope, dizziness, orthopnea, PND, significant weight changes, acute bleeding, or claudication. Scheduled to see [facility] next week.   Please see the history of present illness.    All other systems reviewed and are negative.  EKGs/Labs/Other Studies Reviewed:    The following studies were reviewed today:  EKG:  EKG Interpretation Date/Time:  Friday August 31 2023 15:31:13 EDT Ventricular Rate:  66 PR Interval:  214 QRS Duration:  130 QT Interval:  420 QTC Calculation: 440 R Axis:   -45  Text Interpretation: Sinus rhythm with 1st degree A-V block Left axis deviation Non-specific intra-ventricular conduction block Minimal voltage criteria for LVH, may be normal variant ( Cornell product ) Cannot rule  out Septal infarct (cited on or before 02-May-2023) When compared with ECG of 02-May-2023 16:44, Questionable change in initial forces of Septal leads Confirmed by Miriam Norris [phone]) on 09/06/2023 10:45:58 AM    PVL ABI 07/2023 [facility]): Final Impression    * The bilateral ABIs are moderate-severely abnormal.    * The bilateral TBIs are mildly abnormal.    ABI Measurements  RIGHT Brachial Pressure (mmHg): 124  RIGHT DP Pressure (mmHg): 91  RIGHT PTA Pressure (mmHg): 81  LEFT Brachial Pressure (mmHg): 126  LEFT DP Pressure (mmHg): 88  LEFT PTA Pressure (mmHg): 94   RIGHT ABI Ratio: 0.72  LEFT ABI Ratio: 0.75   TBI Measurements  RIGHT Great Toe Pressure (mmHg): 76  LEFT Great Toe Pressure (mmHg): 71   RIGHT TBI Ratio: 0.60  LEFT TBI Ratio: 0.56  PVL venous duplex lower extremity bilateral 07/2023 [facility]):  IMPRESSION:   1. Negative examination of the deep venous system of  both lower extremities. There is no evidence of deep vein thrombosis.   2. Prominent lymph node with normal morphology in the right inguinal region noted. This likely represents a reactive lymph node.  Echo 07/2023 [facility]):  Summary   1. The left ventricle is mildly dilated in size with mildly increased wall  thickness.   2. The left ventricular systolic function is normal, LVEF is visually  estimated at > 55%.    3. There is grade I diastolic dysfunction (impaired relaxation).    4. Aortic sclerosis.    5. The right ventricle is normal in size, with normal systolic function.    Right and left heart cath on August 28, 2018: The left ventricular systolic function is normal. The left ventricular ejection fraction is 55-65% by visual estimate.   1. No angiographic evidence of CAD 2. Mildly elevated left ventricular filling pressures 3. Dyspnea likely multifactorial due to COPD, obesity and physical deconditioning with chronic diastolic CHF   No further ischemic workup.   Recent Labs: 05/02/2023: B  Natriuretic Peptide 13.0 05/18/2023: ALT 13; BUN 40; Creatinine, Ser 1.06; Hemoglobin 9.6; Platelets 336; Potassium 4.5; Sodium 134  Recent Lipid Panel No results found for: CHOL, TRIG, HDL, CHOLHDL, VLDL, LDLCALC, LDLDIRECT   Physical Exam:    VS:  BP 132/64   Pulse 86   Ht 5' 7 (1.702 m)   Wt (!) 331 lb (150.1 kg)   SpO2 95%   BMI 51.84 kg/m     Wt Readings from Last 3 Encounters:  08/31/23 (!) 331 lb (150.1 kg)  08/02/23 (!) 336 lb (152.4 kg)  05/18/23 (!) 325 lb (147.4 kg)     GEN: Morbidly obese, 63 y.o. female in no acute distress, sitting in wheelchair HEENT: Normal NECK: No JVD; No carotid bruits CARDIAC: S1/S2, irregular rate and rhythm, Grade 2/6 systolic murmur noted, no rubs or gallops noted; 2+ radial pulses, 1+ PT. RESPIRATORY:  Clear to auscultation without rales, wheezing or rhonchi  MUSCULOSKELETAL:  Generalized, nonpitting edema, chronic lymphedema (LLE > RLE) - left lower extremity wrapped SKIN: Warm and dry NEUROLOGIC:  Alert and oriented x 3  PSYCHIATRIC:  Normal, pleasant affect    ASSESSMENT & PLAN:    In order of problems listed above:  Chest pain of uncertain etiology Very unclear etiology, sounds atypical. Wonder if she might have microvascular disease. Reviewed right and left heart cath from 2020 that showed no evidence of CAD. EKG is reassuring today. No indication for ischemic evaluation at this time. Obtaining BMET as noted below. If no improvement by next office visit, plan to increase Imdur  dosage and consult clinical pharm D about Ranexa. No medication changes at this time. Care and ED precautions discussed.   HFpEF, shortness of breath Stage C, NYHA class II-III symptoms.  EF greater than 55%.  Has done well on Torsemide , however admits to more shortness of breath recently, difficult to ascertain volume status. Will increase Torsemide  to 60 mg in AM and 40 mg in PM x 3 days, then return to normal, will obtain BMET.  She is not a  candidate for SGLT2 inhibitor due to her history of recurrent cellulitis.  No other medication changes at this time. Low sodium diet, fluid restriction <2L, and daily weights encouraged. Educated to contact our office for weight gain of 2 lbs overnight or 5 lbs in one week.  PAF Does admit to labile HR noted at times - see HPI. HR is well controlled today.   Continue diltiazem  and flecainide . Will continue to monitor. Denies any recent bleeding issues on Xarelto .  Continue Xarelto  for stroke prevention. Heart healthy diet and regular cardiovascular exercise as tolerated encouraged.   HTN BP stable today.  BP well-controlled at home.  Continue current medication regimen. Discussed to monitor BP at home at least 2 hours after medications and sitting for 5-10 minutes. Heart healthy diet and regular cardiovascular exercise as tolerated encouraged.   COPD, OSA treated with BiPAP Denies any acute exacerbations or worsening symptoms.  Encouraged continued compliance with BiPAP.  Continue current medication regimen and continue to follow-up with PCP.  Morbid obesity Weight loss via diet and exercise as tolerated encouraged. Discussed the impact being overweight would have on cardiovascular risk.  Recurrent cellulitis, hx of wound Being followed by Infectious Disease. Continue to follow-up with ID and and will place wound care referral per pt request for wound care treatments.  8. Anemia Recent labs revealed anemia, with past slight down trending of Hgb. Denies any recent bleeding issues. Will refer to Hematology for further evaluation.   9. CKD stage 3a Most recent labs reviewed show relatively stable kidney disease. Did originally place referral in for Nephrology, but will cancel this d/t current guidelines. Avoid nephrotoxic agents, will obtain labs as mentioned earlier. Continue to follow with PCP.  Disposition: Care and ED precautions discussed.  Follow-up with MD/APP as scheduled or sooner if  anything changes.   I spent a total duration of 50 minutes reviewing prior notes, reviewing outside records including  labs, EKG today, face-to-face counseling of medical condition, pathophysiology, evaluation, management, and documenting the findings in the note.   Medication Adjustments/Labs and Tests Ordered: Current medicines are reviewed at length with the patient today.  Concerns regarding medicines are outlined above.  Orders Placed This Encounter  Procedures   Basic Metabolic Panel (BMET)   Ambulatory referral to Hematology / Oncology   Ambulatory referral to Wound Clinic   EKG 12-Lead   No orders of the defined types were placed in this encounter.   Patient Instructions  Medication Instructions:  Your physician has recommended you make the following change in your  medication:  Please Increase Torsemide  to 60 Mg in the AM and 40 Mg in the PM X 3 days, then reduce to 40 Mg Twice daily   Labwork: In 1-2 weeks at [facility]   Testing/Procedures: None   Follow-Up: Your physician recommends that you schedule a follow-up appointment in: As scheduled   Any Other Special Instructions Will Be Listed Below (If Applicable).  If you need a refill on your cardiac medications before your next appointment, please call your pharmacy.   Signed, Almarie Crate, NP

## 2023-08-31 NOTE — Patient Instructions (Addendum)
 Medication Instructions:  Your physician has recommended you make the following change in your medication:  Please Increase Torsemide  to 60 Mg in the AM and 40 Mg in the PM X 3 days, then reduce to 40 Mg Twice daily   Labwork: In 1-2 weeks at [facility]   Testing/Procedures: None   Follow-Up: Your physician recommends that you schedule a follow-up appointment in: As scheduled   Any Other Special Instructions Will Be Listed Below (If Applicable).  If you need a refill on your cardiac medications before your next appointment, please call your pharmacy.

## 2023-09-01 DIAGNOSIS — M4800 Spinal stenosis, site unspecified: Secondary | ICD-10-CM | POA: Diagnosis not present

## 2023-09-01 DIAGNOSIS — D649 Anemia, unspecified: Secondary | ICD-10-CM | POA: Diagnosis not present

## 2023-09-01 DIAGNOSIS — I5033 Acute on chronic diastolic (congestive) heart failure: Secondary | ICD-10-CM | POA: Diagnosis not present

## 2023-09-01 DIAGNOSIS — E1165 Type 2 diabetes mellitus with hyperglycemia: Secondary | ICD-10-CM | POA: Diagnosis not present

## 2023-09-01 DIAGNOSIS — N8110 Cystocele, unspecified: Secondary | ICD-10-CM | POA: Diagnosis not present

## 2023-09-01 DIAGNOSIS — E11620 Type 2 diabetes mellitus with diabetic dermatitis: Secondary | ICD-10-CM | POA: Diagnosis not present

## 2023-09-01 DIAGNOSIS — K3184 Gastroparesis: Secondary | ICD-10-CM | POA: Diagnosis not present

## 2023-09-01 DIAGNOSIS — I110 Hypertensive heart disease with heart failure: Secondary | ICD-10-CM | POA: Diagnosis not present

## 2023-09-01 DIAGNOSIS — L97828 Non-pressure chronic ulcer of other part of left lower leg with other specified severity: Secondary | ICD-10-CM | POA: Diagnosis not present

## 2023-09-01 DIAGNOSIS — I4821 Permanent atrial fibrillation: Secondary | ICD-10-CM | POA: Diagnosis not present

## 2023-09-01 DIAGNOSIS — J9601 Acute respiratory failure with hypoxia: Secondary | ICD-10-CM | POA: Diagnosis not present

## 2023-09-01 DIAGNOSIS — G4730 Sleep apnea, unspecified: Secondary | ICD-10-CM | POA: Diagnosis not present

## 2023-09-01 DIAGNOSIS — S90934D Unspecified superficial injury of right lesser toe(s), subsequent encounter: Secondary | ICD-10-CM | POA: Diagnosis not present

## 2023-09-01 DIAGNOSIS — J449 Chronic obstructive pulmonary disease, unspecified: Secondary | ICD-10-CM | POA: Diagnosis not present

## 2023-09-01 DIAGNOSIS — L03115 Cellulitis of right lower limb: Secondary | ICD-10-CM | POA: Diagnosis not present

## 2023-09-01 DIAGNOSIS — M069 Rheumatoid arthritis, unspecified: Secondary | ICD-10-CM | POA: Diagnosis not present

## 2023-09-01 DIAGNOSIS — I890 Lymphedema, not elsewhere classified: Secondary | ICD-10-CM | POA: Diagnosis not present

## 2023-09-01 DIAGNOSIS — L03116 Cellulitis of left lower limb: Secondary | ICD-10-CM | POA: Diagnosis not present

## 2023-09-01 DIAGNOSIS — E1143 Type 2 diabetes mellitus with diabetic autonomic (poly)neuropathy: Secondary | ICD-10-CM | POA: Diagnosis not present

## 2023-09-01 DIAGNOSIS — E1142 Type 2 diabetes mellitus with diabetic polyneuropathy: Secondary | ICD-10-CM | POA: Diagnosis not present

## 2023-09-01 DIAGNOSIS — M797 Fibromyalgia: Secondary | ICD-10-CM | POA: Diagnosis not present

## 2023-09-03 DIAGNOSIS — J449 Chronic obstructive pulmonary disease, unspecified: Secondary | ICD-10-CM | POA: Diagnosis not present

## 2023-09-03 DIAGNOSIS — E1142 Type 2 diabetes mellitus with diabetic polyneuropathy: Secondary | ICD-10-CM | POA: Diagnosis not present

## 2023-09-03 DIAGNOSIS — G4733 Obstructive sleep apnea (adult) (pediatric): Secondary | ICD-10-CM | POA: Diagnosis not present

## 2023-09-03 DIAGNOSIS — M1710 Unilateral primary osteoarthritis, unspecified knee: Secondary | ICD-10-CM | POA: Diagnosis not present

## 2023-09-03 NOTE — Progress Notes (Unsigned)
 [facility] [place]  ASSESSMENT / PLAN: [name] [name] is a 63 y.o. female with mixed disease of bilateral lower extremities.  She has a nearly healed left venous stasis ulcer.  She has a nearly healed right fifth toe neuropathic versus ischemic ulcer that is nearly healed.  The appearance of her foot is much better than her noninvasive testing suggests.  I suspect she will heal without intervention.  She is a poor interventional candidate given her morbid obesity and limited ability to ambulate.  Will see her again in 3 months with repeat noninvasive testing  CHIEF COMPLAINT: Evaluate ulcers of bilateral lower extremities  HISTORY OF PRESENT ILLNESS: [name] [name] is a 63 y.o. female referred to clinic for evaluation of bilateral lower extremity ulceration.  On the left she has a calf venous ulcer which is nearly healed with Unna boot therapy.  On the right she has 1/5 toe dorsal pressure ulcer which appears either neuropathic or ischemic.  This is also nearly healed.  On noninvasive testing in outside hospital, she has moderate to severe peripheral arterial disease.  Patient is nonambulatory and moves mostly with a wheelchair.  She has Charcot neuropathy of her bilateral lower extremities.  Past Medical History:  Diagnosis Date   Anemia    Bladder dysfunction    Self urinary catheterization   Chronic pain syndrome    long term and current opiates   Complication of anesthesia    COPD (chronic obstructive pulmonary disease) (HCC)    DDD (degenerative disc disease)    Depression    Dysrhythmia    Bradycardia, A.fib   Esophageal spasm    NTG and Norvasc   Essential hypertension    Fibromyalgia    Gastroparesis    GERD (gastroesophageal reflux disease)    History of cardiac catheterization    Normal coronary arteries   History of pituitary tumor    Irritable bowel syndrome    Lymphedema    Osteoarthritis    PAF (paroxysmal atrial fibrillation) (HCC)     Pneumonia    PONV (postoperative nausea and vomiting)    Psoriatic arthritis (HCC)    Rheumatoid arthritis (HCC)    Sleep apnea    BIPAP   Type 2 diabetes mellitus (HCC)    Urinary tract infection     Past Surgical History:  Procedure Laterality Date   ABDOMINAL HYSTERECTOMY  1997   ANTERIOR CERVICAL DECOMP/DISCECTOMY FUSION N/A 12/02/2014   Procedure: Cervical five cervical six anterior cervical decompression with fusion interbody prosthesis plating and bone graft;  Surgeon: Reyes Budge, MD;  Location: [facility];  Service: Neurosurgery;  Laterality: N/A;  C56 anterior cervical decompression with fusion interbody prosthesis plating and bonegraft   APPENDECTOMY  09/1997   BACK SURGERY     lumbar    1991 and 2009   CARDIAC CATHETERIZATION     2020   CARDIOVERSION N/A 03/26/2014   Procedure: CARDIOVERSION;  Surgeon: Pearla DELENA Rout, MD;  Location: [facility];  Service: Endoscopy;  Laterality: N/A;   CARDIOVERSION N/A 05/04/2014   Procedure: CARDIOVERSION;  Surgeon: Jayson KANDICE Sierras, MD;  Location: [facility];  Service: Cardiovascular;  Laterality: N/A;   CHOLECYSTECTOMY  1996   laparoscopic   COLONOSCOPY     Approximately 2003. Per medical records, internal hemorrhoids noted   COLONOSCOPY N/A 06/25/2013   Dr. Shaaron: Anal canal hemorrhoids-more likely the source of paper hematochezia. Redundant, capacious colon. Multiple colonic polyps-tubular adenoma   COLONOSCOPY WITH PROPOFOL  N/A 12/04/2016  Dr. Shaaron: redundant colon, colonic diverticulosis. Screening due in 2023   COLONOSCOPY WITH PROPOFOL  N/A 08/11/2021   Procedure: COLONOSCOPY WITH PROPOFOL ;  Surgeon: Shaaron Lamar HERO, MD;  Location: [facility];  Service: Endoscopy;  Laterality: N/A;  9:15am   ENDOVENOUS ABLATION SAPHENOUS VEIN W/ LASER Left 03/07/2018   endovenous ablation left greater saphenous vein by Lonni Blade MD    ESOPHAGOGASTRODUODENOSCOPY  10/12/2004   MFM:[id number] plaquing on the esophageal mucosa of  uncertain significance, not typical of what is seen with candida esophagitis status post KOH brushing for KOH prep and biopsy for histology. Rule out candida esophagitis/eosinophilic esophagitis. Otherwise normal esophagus. Tiny hiatal hernia. Otherwise, normal stomach, normal D1 and D2. Benign biopsy of esophagus, unknown KOH status.    ESOPHAGOGASTRODUODENOSCOPY N/A 06/25/2013   Dr. Rourk:mild chronic gastritis   HAND SURGERY Left 2012   ganglion excision and nerve repair   KNEE SURGERY Left 1997   arthroscopy and debridement   LEFT HEART CATHETERIZATION WITH CORONARY ANGIOGRAM N/A 11/15/2010   Procedure: LEFT HEART CATHETERIZATION WITH CORONARY ANGIOGRAM;  Surgeon: Erick JONELLE Bergamo, MD;  Location: [facility] CATH LAB;  Service: Cardiovascular;  Laterality: N/A;   LUNG BIOPSY  2007   benign per patient   POLYPECTOMY  08/11/2021   Procedure: POLYPECTOMY INTESTINAL;  Surgeon: Shaaron Lamar HERO, MD;  Location: [facility];  Service: Endoscopy;;   RIGHT/LEFT HEART CATH AND CORONARY ANGIOGRAPHY N/A 08/28/2018   Procedure: RIGHT/LEFT HEART CATH AND CORONARY ANGIOGRAPHY;  Surgeon: Verlin Lonni BIRCH, MD;  Location: [facility];  Service: Cardiovascular;  Laterality: N/A;   SHOULDER ARTHROSCOPY W/ ROTATOR CUFF REPAIR Left 02/16/2016    Family History  Problem Relation Age of Onset   Coronary artery disease Father        Died age 23   Heart attack Father    Arrhythmia Father        AF   Diabetes Father    Parkinson's disease Father    Arrhythmia Mother        AF   Stroke Mother    Dementia Mother    Cancer Mother        UTERINE    Parkinson's disease Mother    Arrhythmia Brother        AF   Colon cancer Maternal Grandfather    Colon cancer Paternal Aunt    Colon cancer Paternal Aunt    Diabetes Son    Cancer Sister        BREAST    Depression Sister    Breast cancer Sister    Depression Sister    Depression Sister    Breast cancer Maternal Aunt     Social History    Socioeconomic History   Marital status: Divorced    Spouse name: Not on file   Number of children: Not on file   Years of education: Not on file   Highest education level: Not on file  Occupational History   Not on file  Tobacco Use   Smoking status: Former    Current packs/day: 0.00    Average packs/day: 1.5 packs/day for 30.0 years (45.0 ttl pk-yrs)    Types: Cigarettes    Start date: 03/06/1977    Quit date: 01/03/2007    Years since quitting: 16.6   Smokeless tobacco: Never  Vaping Use   Vaping status: Never Used  Substance and Sexual Activity   Alcohol  use: No    Alcohol /week: 0.0 standard drinks of alcohol    Drug use:  No   Sexual activity: Not Currently  Other Topics Concern   Not on file  Social History Narrative   Not on file   Social Drivers of Health   Financial Resource Strain: Low Risk  (09/22/2022)   Overall Financial Resource Strain (CARDIA)    Difficulty of Paying Living Expenses: Not very hard  Food Insecurity: No Food Insecurity (05/03/2023)   Hunger Vital Sign    Worried About Running Out of Food in the Last Year: Never true    Ran Out of Food in the Last Year: Never true  Transportation Needs: No Transportation Needs (05/03/2023)   PRAPARE - Administrator, Civil Service (Medical): No    Lack of Transportation (Non-Medical): No  Physical Activity: Inactive (08/18/2022)   Exercise Vital Sign    Days of Exercise per Week: 0 days    Minutes of Exercise per Session: 0 min  Stress: Not on file  Social Connections: Patient Declined (05/03/2023)   Social Connection and Isolation Panel    Frequency of Communication with Friends and Family: Patient declined    Frequency of Social Gatherings with Friends and Family: Patient declined    Attends Religious Services: Patient declined    Database administrator or Organizations: Patient declined    Attends Banker Meetings: Patient declined    Marital Status: Patient declined  Intimate Partner  Violence: Not At Risk (05/03/2023)   Humiliation, Afraid, Rape, and Kick questionnaire    Fear of Current or Ex-Partner: No    Emotionally Abused: No    Physically Abused: No    Sexually Abused: No    Allergies  Allergen Reactions   Levofloxacin Other (See Comments)    Psychosis   Tape Other (See Comments)    Welts, bad place on skin. Paper tape is okay   Wound Dressing Adhesive Hives    Welts, bad place on skin. Paper tape is okay   Hyoscyamine Sulfate Palpitations   Metoclopramide Other (See Comments)    Tremors     Current Outpatient Medications  Medication Sig Dispense Refill   albuterol  (VENTOLIN  HFA) 108 (90 Base) MCG/ACT inhaler Inhale 2 puffs into the lungs.     atorvastatin  (LIPITOR) 40 MG tablet Take 40 mg by mouth daily.     baclofen  (LIORESAL ) 20 MG tablet Take 20 mg by mouth 3 (three) times daily.     CARTIA  XT 120 MG 24 hr capsule TAKE ONE CAPSULE BY MOUTH EVERY DAY 90 capsule 3   dexlansoprazole  (DEXILANT ) 60 MG capsule Take 1 capsule (60 mg total) by mouth daily. 90 capsule 3   diclofenac sodium (VOLTAREN) 1 % GEL Apply 2-4 g topically 4 (four) times daily as needed (for pain).      docusate sodium  (COLACE) 100 MG capsule Take 100 mg by mouth 2 (two) times daily.     ferrous sulfate 325 (65 FE) MG EC tablet Take 325 mg by mouth daily with breakfast.     flecainide  (TAMBOCOR ) 50 MG tablet TAKE 2 TABLETS BY MOUTH EVERY MORNING and TAKE 2 TABLETS BY MOUTH EVERY EVENING 120 tablet 1   fluticasone  (FLONASE ) 50 MCG/ACT nasal spray INSTILL 2 SPRAYS IN EACH NOSTRIL EVERY DAY 16 g 6   gabapentin  (NEURONTIN ) 800 MG tablet Take 800 mg by mouth 4 (four) times daily.   2   glimepiride (AMARYL) 4 MG tablet Take 4 mg by mouth 2 (two) times daily.     hydrocortisone  (ANUSOL -HC) 2.5 % rectal cream Place  1 Application rectally 2 (two) times daily. For rectal bleeding 30 g 1   isosorbide  mononitrate (IMDUR ) 120 MG 24 hr tablet Take 120 mg by mouth daily. In the morning     isosorbide   mononitrate (IMDUR ) 30 MG 24 hr tablet Take 30 mg by mouth every evening.     linaclotide  (LINZESS ) 290 MCG CAPS capsule Take 1 capsule (290 mcg total) by mouth daily before breakfast. 90 capsule 3   lisinopril  (PRINIVIL ,ZESTRIL ) 10 MG tablet Take 10 mg by mouth every evening.      loratadine  (CLARITIN ) 10 MG tablet Take 1 tablet (10 mg total) by mouth daily. 30 tablet 11   meclizine  (ANTIVERT ) 25 MG tablet Take 25 mg by mouth 3 (three) times daily.     metFORMIN  (GLUCOPHAGE ) 500 MG tablet Take 500-1,000 mg by mouth 2 (two) times daily. Take 1,000mg  in the Morning and 500 mg in the evening     metolazone  (ZAROXOLYN ) 2.5 MG tablet Take 1 tablet (2.5 mg total) by mouth daily. 30 Minutes before taking your torsemide . 90 tablet 1   Multiple Vitamin (MULTIVITAMIN WITH MINERALS) TABS tablet Take 1 tablet by mouth daily.     mupirocin ointment (BACTROBAN) 2 % Apply 1 Application topically in the morning and at bedtime.     nitroGLYCERIN  (NITROLINGUAL ) 0.4 MG/SPRAY spray USE 1 SPRAY UNDER THE TONGUE EVERY 5 MINUTES UP TO 3 DOSES AS NEEDED FOR CHEST PAIN. GO TO ER IF NOT RESOLVED 12 g 3   NUCYNTA  ER 100 MG 12 hr tablet Take 100 mg by mouth 2 (two) times daily.     nystatin  (MYCOSTATIN /NYSTOP ) 100000 UNIT/GM POWD Apply 1 g topically daily.     ondansetron  (ZOFRAN -ODT) 8 MG disintegrating tablet Take 8 mg by mouth daily.     OTEZLA  30 MG TABS Take 1 tablet by mouth 2 (two) times daily.     oxyCODONE -acetaminophen  (PERCOCET) 10-325 MG per tablet Take 1 tablet by mouth every 4 (four) hours as needed for pain.     OXYGEN  Inhale 2 L into the lungs as needed (shortness of breath).     OZEMPIC, 1 MG/DOSE, 4 MG/3ML SOPN Inject 1 mg into the skin every Tuesday.     Prucalopride Succinate  (MOTEGRITY ) 2 MG TABS Take 1 tablet (2 mg total) by mouth daily. 90 tablet 3   rivaroxaban  (XARELTO ) 20 MG TABS tablet Take 20 mg by mouth daily with supper.      sodium chloride  (OCEAN) 0.65 % SOLN nasal spray Place 1 spray into  both nostrils daily.     spironolactone  (ALDACTONE ) 50 MG tablet Take 50 mg by mouth 3 (three) times daily.     torsemide  40 MG TABS Take 40 mg by mouth 2 (two) times daily. 180 tablet 1   TOUJEO  SOLOSTAR 300 UNIT/ML Solostar Pen Inject 43 Units into the skin at bedtime.     umeclidinium-vilanterol (ANORO ELLIPTA ) 62.5-25 MCG/ACT AEPB Inhale 1 puff into the lungs daily. 60 each 11   VASCEPA  1 g CAPS Take 1 g by mouth 4 (four) times daily.     No current facility-administered medications for this visit.    PHYSICAL EXAM Vitals:   09/04/23 1324  BP: (!) 109/48  Pulse: (!) 58  SpO2: 93%   Appears older than stated age.  No acute distress Regular rate and rhythm In the breathing Obese abdomen.  In wheelchair Bilateral feet are warm and pink.  There is a almost completely healed venous ulcer in the left anterior calf.  There is an almost completely healed left neuropathic versus ischemic ulcer on the dorsum of the right fifth toe  PERTINENT LABORATORY AND RADIOLOGIC DATA  Most recent CBC    Latest Ref Rng & Units 05/18/2023    2:01 PM 05/04/2023    4:36 AM 05/03/2023    4:26 AM  CBC  WBC 4.0 - 10.5 K/uL 11.5  10.1  12.2   Hemoglobin 12.0 - 15.0 g/dL 9.6  88.8  89.4   Hematocrit 36.0 - 46.0 % 30.8  33.9  32.4   Platelets 150 - 400 K/uL 336  302  323      Most recent CMP    Latest Ref Rng & Units 05/18/2023    2:01 PM 05/04/2023    4:36 AM 05/03/2023    4:26 AM  CMP  Glucose 70 - 99 mg/dL 897  885  869   BUN 8 - 23 mg/dL 40  26  27   Creatinine 0.44 - 1.00 mg/dL 8.93  9.09  9.00   Sodium 135 - 145 mmol/L 134  136  135   Potassium 3.5 - 5.1 mmol/L 4.5  3.5  4.3   Chloride 98 - 111 mmol/L 99  96  103   CO2 22 - 32 mmol/L 26  26  23    Calcium  8.9 - 10.3 mg/dL 9.3  9.9  9.6   Total Protein 6.5 - 8.1 g/dL 7.0     Total Bilirubin 0.0 - 1.2 mg/dL 0.5     Alkaline Phos 38 - 126 U/L 63     AST 15 - 41 U/L 16     ALT 0 - 44 U/L 13       Renal function CrCl cannot be calculated  (Patient's most recent lab result is older than the maximum 21 days allowed.).  Hgb A1c MFr Bld (%)  Date Value  05/02/2023 5.8 (H)     * An ankle to brachial index, toe to brachial index, ankle PVR waveforms and  photoplethysmographic (PPG) toe waveforms were obtained using the [employer] device.    Final Impression    * The bilateral ABIs are moderate-severely abnormal.    * The bilateral TBIs are mildly abnormal.    ABI Measurements  RIGHT Brachial Pressure (mmHg): 124  RIGHT DP Pressure (mmHg): 91  RIGHT PTA Pressure (mmHg): 81  LEFT Brachial Pressure (mmHg): 126  LEFT DP Pressure (mmHg): 88  LEFT PTA Pressure (mmHg): 94   RIGHT ABI Ratio: 0.72  LEFT ABI Ratio: 0.75   TBI Measurements  RIGHT Great Toe Pressure (mmHg): 76  LEFT Great Toe Pressure (mmHg): 71   RIGHT TBI Ratio: 0.60  LEFT TBI Ratio: 0.56   Sharbel Sahagun N. Magda, MD FACS [facility] Phone Number: [phone] 09/04/2023 9:26 PM   Total time spent on preparing this encounter including chart review, data review, collecting history, examining the patient, and coordinating care: 45 minutes  Portions of this report may have been transcribed using voice recognition software.  Every effort has been made to ensure accuracy; however, inadvertent computerized transcription errors may still be present.

## 2023-09-04 ENCOUNTER — Encounter: Payer: Self-pay | Admitting: Vascular Surgery

## 2023-09-04 ENCOUNTER — Ambulatory Visit: Attending: Vascular Surgery | Admitting: Vascular Surgery

## 2023-09-04 VITALS — BP 109/48 | HR 58

## 2023-09-04 DIAGNOSIS — I739 Peripheral vascular disease, unspecified: Secondary | ICD-10-CM | POA: Diagnosis not present

## 2023-09-04 DIAGNOSIS — I872 Venous insufficiency (chronic) (peripheral): Secondary | ICD-10-CM

## 2023-09-05 DIAGNOSIS — E1142 Type 2 diabetes mellitus with diabetic polyneuropathy: Secondary | ICD-10-CM | POA: Diagnosis not present

## 2023-09-05 DIAGNOSIS — M4800 Spinal stenosis, site unspecified: Secondary | ICD-10-CM | POA: Diagnosis not present

## 2023-09-05 DIAGNOSIS — M797 Fibromyalgia: Secondary | ICD-10-CM | POA: Diagnosis not present

## 2023-09-05 DIAGNOSIS — I890 Lymphedema, not elsewhere classified: Secondary | ICD-10-CM | POA: Diagnosis not present

## 2023-09-05 DIAGNOSIS — S90934D Unspecified superficial injury of right lesser toe(s), subsequent encounter: Secondary | ICD-10-CM | POA: Diagnosis not present

## 2023-09-05 DIAGNOSIS — I5033 Acute on chronic diastolic (congestive) heart failure: Secondary | ICD-10-CM | POA: Diagnosis not present

## 2023-09-05 DIAGNOSIS — J9601 Acute respiratory failure with hypoxia: Secondary | ICD-10-CM | POA: Diagnosis not present

## 2023-09-05 DIAGNOSIS — G4730 Sleep apnea, unspecified: Secondary | ICD-10-CM | POA: Diagnosis not present

## 2023-09-05 DIAGNOSIS — L03116 Cellulitis of left lower limb: Secondary | ICD-10-CM | POA: Diagnosis not present

## 2023-09-05 DIAGNOSIS — M069 Rheumatoid arthritis, unspecified: Secondary | ICD-10-CM | POA: Diagnosis not present

## 2023-09-05 DIAGNOSIS — E1165 Type 2 diabetes mellitus with hyperglycemia: Secondary | ICD-10-CM | POA: Diagnosis not present

## 2023-09-05 DIAGNOSIS — L03115 Cellulitis of right lower limb: Secondary | ICD-10-CM | POA: Diagnosis not present

## 2023-09-05 DIAGNOSIS — I110 Hypertensive heart disease with heart failure: Secondary | ICD-10-CM | POA: Diagnosis not present

## 2023-09-05 DIAGNOSIS — L97828 Non-pressure chronic ulcer of other part of left lower leg with other specified severity: Secondary | ICD-10-CM | POA: Diagnosis not present

## 2023-09-05 DIAGNOSIS — E11620 Type 2 diabetes mellitus with diabetic dermatitis: Secondary | ICD-10-CM | POA: Diagnosis not present

## 2023-09-05 DIAGNOSIS — E1143 Type 2 diabetes mellitus with diabetic autonomic (poly)neuropathy: Secondary | ICD-10-CM | POA: Diagnosis not present

## 2023-09-05 DIAGNOSIS — N8110 Cystocele, unspecified: Secondary | ICD-10-CM | POA: Diagnosis not present

## 2023-09-05 DIAGNOSIS — K3184 Gastroparesis: Secondary | ICD-10-CM | POA: Diagnosis not present

## 2023-09-05 DIAGNOSIS — D649 Anemia, unspecified: Secondary | ICD-10-CM | POA: Diagnosis not present

## 2023-09-05 DIAGNOSIS — I4821 Permanent atrial fibrillation: Secondary | ICD-10-CM | POA: Diagnosis not present

## 2023-09-05 DIAGNOSIS — J449 Chronic obstructive pulmonary disease, unspecified: Secondary | ICD-10-CM | POA: Diagnosis not present

## 2023-09-06 ENCOUNTER — Other Ambulatory Visit: Payer: Self-pay | Admitting: *Deleted

## 2023-09-06 DIAGNOSIS — I739 Peripheral vascular disease, unspecified: Secondary | ICD-10-CM

## 2023-09-06 DIAGNOSIS — I872 Venous insufficiency (chronic) (peripheral): Secondary | ICD-10-CM

## 2023-09-10 DIAGNOSIS — I890 Lymphedema, not elsewhere classified: Secondary | ICD-10-CM | POA: Diagnosis not present

## 2023-09-10 DIAGNOSIS — M797 Fibromyalgia: Secondary | ICD-10-CM | POA: Diagnosis not present

## 2023-09-10 DIAGNOSIS — M069 Rheumatoid arthritis, unspecified: Secondary | ICD-10-CM | POA: Diagnosis not present

## 2023-09-10 DIAGNOSIS — L03115 Cellulitis of right lower limb: Secondary | ICD-10-CM | POA: Diagnosis not present

## 2023-09-10 DIAGNOSIS — I110 Hypertensive heart disease with heart failure: Secondary | ICD-10-CM | POA: Diagnosis not present

## 2023-09-10 DIAGNOSIS — L97828 Non-pressure chronic ulcer of other part of left lower leg with other specified severity: Secondary | ICD-10-CM | POA: Diagnosis not present

## 2023-09-10 DIAGNOSIS — I5033 Acute on chronic diastolic (congestive) heart failure: Secondary | ICD-10-CM | POA: Diagnosis not present

## 2023-09-10 DIAGNOSIS — D649 Anemia, unspecified: Secondary | ICD-10-CM | POA: Diagnosis not present

## 2023-09-10 DIAGNOSIS — K3184 Gastroparesis: Secondary | ICD-10-CM | POA: Diagnosis not present

## 2023-09-10 DIAGNOSIS — J9601 Acute respiratory failure with hypoxia: Secondary | ICD-10-CM | POA: Diagnosis not present

## 2023-09-10 DIAGNOSIS — E1142 Type 2 diabetes mellitus with diabetic polyneuropathy: Secondary | ICD-10-CM | POA: Diagnosis not present

## 2023-09-10 DIAGNOSIS — S90934D Unspecified superficial injury of right lesser toe(s), subsequent encounter: Secondary | ICD-10-CM | POA: Diagnosis not present

## 2023-09-10 DIAGNOSIS — E11620 Type 2 diabetes mellitus with diabetic dermatitis: Secondary | ICD-10-CM | POA: Diagnosis not present

## 2023-09-10 DIAGNOSIS — L03116 Cellulitis of left lower limb: Secondary | ICD-10-CM | POA: Diagnosis not present

## 2023-09-10 DIAGNOSIS — J449 Chronic obstructive pulmonary disease, unspecified: Secondary | ICD-10-CM | POA: Diagnosis not present

## 2023-09-10 DIAGNOSIS — M4800 Spinal stenosis, site unspecified: Secondary | ICD-10-CM | POA: Diagnosis not present

## 2023-09-10 DIAGNOSIS — E1165 Type 2 diabetes mellitus with hyperglycemia: Secondary | ICD-10-CM | POA: Diagnosis not present

## 2023-09-10 DIAGNOSIS — I4821 Permanent atrial fibrillation: Secondary | ICD-10-CM | POA: Diagnosis not present

## 2023-09-10 DIAGNOSIS — G4730 Sleep apnea, unspecified: Secondary | ICD-10-CM | POA: Diagnosis not present

## 2023-09-10 DIAGNOSIS — E1143 Type 2 diabetes mellitus with diabetic autonomic (poly)neuropathy: Secondary | ICD-10-CM | POA: Diagnosis not present

## 2023-09-10 DIAGNOSIS — N8110 Cystocele, unspecified: Secondary | ICD-10-CM | POA: Diagnosis not present

## 2023-09-13 DIAGNOSIS — M48061 Spinal stenosis, lumbar region without neurogenic claudication: Secondary | ICD-10-CM | POA: Diagnosis not present

## 2023-09-13 DIAGNOSIS — E1142 Type 2 diabetes mellitus with diabetic polyneuropathy: Secondary | ICD-10-CM | POA: Diagnosis not present

## 2023-09-13 DIAGNOSIS — G894 Chronic pain syndrome: Secondary | ICD-10-CM | POA: Diagnosis not present

## 2023-09-13 DIAGNOSIS — M47816 Spondylosis without myelopathy or radiculopathy, lumbar region: Secondary | ICD-10-CM | POA: Diagnosis not present

## 2023-09-14 ENCOUNTER — Encounter: Payer: Self-pay | Admitting: Oncology

## 2023-09-14 ENCOUNTER — Other Ambulatory Visit: Payer: Self-pay

## 2023-09-14 ENCOUNTER — Inpatient Hospital Stay

## 2023-09-14 ENCOUNTER — Inpatient Hospital Stay: Attending: Oncology | Admitting: Oncology

## 2023-09-14 VITALS — BP 99/51 | HR 64 | Temp 98.0°F | Resp 18

## 2023-09-14 DIAGNOSIS — I480 Paroxysmal atrial fibrillation: Secondary | ICD-10-CM | POA: Insufficient documentation

## 2023-09-14 DIAGNOSIS — J449 Chronic obstructive pulmonary disease, unspecified: Secondary | ICD-10-CM | POA: Diagnosis not present

## 2023-09-14 DIAGNOSIS — Z803 Family history of malignant neoplasm of breast: Secondary | ICD-10-CM | POA: Diagnosis not present

## 2023-09-14 DIAGNOSIS — Z79899 Other long term (current) drug therapy: Secondary | ICD-10-CM | POA: Diagnosis not present

## 2023-09-14 DIAGNOSIS — I509 Heart failure, unspecified: Secondary | ICD-10-CM | POA: Diagnosis not present

## 2023-09-14 DIAGNOSIS — N1831 Chronic kidney disease, stage 3a: Secondary | ICD-10-CM

## 2023-09-14 DIAGNOSIS — D649 Anemia, unspecified: Secondary | ICD-10-CM | POA: Insufficient documentation

## 2023-09-14 DIAGNOSIS — Z7901 Long term (current) use of anticoagulants: Secondary | ICD-10-CM | POA: Insufficient documentation

## 2023-09-14 DIAGNOSIS — Z808 Family history of malignant neoplasm of other organs or systems: Secondary | ICD-10-CM | POA: Insufficient documentation

## 2023-09-14 DIAGNOSIS — G4733 Obstructive sleep apnea (adult) (pediatric): Secondary | ICD-10-CM | POA: Insufficient documentation

## 2023-09-14 DIAGNOSIS — Z87891 Personal history of nicotine dependence: Secondary | ICD-10-CM | POA: Insufficient documentation

## 2023-09-14 DIAGNOSIS — Z800 Family history of malignant neoplasm of digestive organs: Secondary | ICD-10-CM | POA: Insufficient documentation

## 2023-09-14 DIAGNOSIS — D72829 Elevated white blood cell count, unspecified: Secondary | ICD-10-CM

## 2023-09-14 LAB — COMPREHENSIVE METABOLIC PANEL WITH GFR
ALT: 12 U/L (ref 0–44)
AST: 17 U/L (ref 15–41)
Albumin: 3.6 g/dL (ref 3.5–5.0)
Alkaline Phosphatase: 65 U/L (ref 38–126)
Anion gap: 11 (ref 5–15)
BUN: 33 mg/dL — ABNORMAL HIGH (ref 8–23)
CO2: 27 mmol/L (ref 22–32)
Calcium: 9.9 mg/dL (ref 8.9–10.3)
Chloride: 98 mmol/L (ref 98–111)
Creatinine, Ser: 1.32 mg/dL — ABNORMAL HIGH (ref 0.44–1.00)
GFR, Estimated: 46 mL/min — ABNORMAL LOW (ref >60)
Glucose, Bld: 79 mg/dL (ref 70–99)
Potassium: 4.1 mmol/L (ref 3.5–5.1)
Sodium: 136 mmol/L (ref 135–145)
Total Bilirubin: 0.4 mg/dL (ref 0.0–1.2)
Total Protein: 7.6 g/dL (ref 6.5–8.1)

## 2023-09-14 LAB — TECHNOLOGIST SMEAR REVIEW: Plt Morphology: NORMAL

## 2023-09-14 LAB — RETICULOCYTES
Immature Retic Fract: 14.3 % (ref 2.3–15.9)
RBC.: 3.41 MIL/uL — ABNORMAL LOW (ref 3.87–5.11)
Retic Count, Absolute: 51.2 K/uL (ref 19.0–186.0)
Retic Ct Pct: 1.5 % (ref 0.4–3.1)

## 2023-09-14 LAB — CBC WITH DIFFERENTIAL/PLATELET
Abs Immature Granulocytes: 0.08 K/uL — ABNORMAL HIGH (ref 0.00–0.07)
Basophils Absolute: 0.1 K/uL (ref 0.0–0.1)
Basophils Relative: 1 %
Eosinophils Absolute: 0.4 K/uL (ref 0.0–0.5)
Eosinophils Relative: 3 %
HCT: 30.1 % — ABNORMAL LOW (ref 36.0–46.0)
Hemoglobin: 9.7 g/dL — ABNORMAL LOW (ref 12.0–15.0)
Immature Granulocytes: 1 %
Lymphocytes Relative: 21 %
Lymphs Abs: 2.2 K/uL (ref 0.7–4.0)
MCH: 28.4 pg (ref 26.0–34.0)
MCHC: 32.2 g/dL (ref 30.0–36.0)
MCV: 88.3 fL (ref 80.0–100.0)
Monocytes Absolute: 0.7 K/uL (ref 0.1–1.0)
Monocytes Relative: 6 %
Neutro Abs: 7.5 K/uL (ref 1.7–7.7)
Neutrophils Relative %: 68 %
Platelets: 345 K/uL (ref 150–400)
RBC: 3.41 MIL/uL — ABNORMAL LOW (ref 3.87–5.11)
RDW: 14.6 % (ref 11.5–15.5)
WBC: 10.8 K/uL — ABNORMAL HIGH (ref 4.0–10.5)
nRBC: 0 % (ref 0.0–0.2)

## 2023-09-14 LAB — LACTATE DEHYDROGENASE: LDH: 120 U/L (ref 98–192)

## 2023-09-14 LAB — FOLATE: Folate: >20 ng/mL (ref >5.9)

## 2023-09-14 LAB — VITAMIN B12: Vitamin B-12: 1185 pg/mL — ABNORMAL HIGH (ref 180–914)

## 2023-09-14 LAB — IRON AND TIBC
Iron: 33 ug/dL (ref 28–170)
Saturation Ratios: 8 % — ABNORMAL LOW (ref 10.4–31.8)
TIBC: 415 ug/dL (ref 250–450)
UIBC: 382 ug/dL

## 2023-09-14 LAB — FERRITIN: Ferritin: 66 ng/mL (ref 11–307)

## 2023-09-14 NOTE — Progress Notes (Signed)
 [facility]  Telephone:([phone] Fax:([phone]) [phone]    INITIAL HEMATOLOGY CONSULTATION  Referring MD:  Almarie Crate, NP  Reason for Referral: Anemia  HPI: [name] [name] is a 63 year old female who is seen today for initial evaluation for anemia.  Her most recent lab work available to me is dated 08/17/2023 which showed WBC of 12.6, hemoglobin 9.8, MCV 87, platelet count 333,000, ANC 8.3, absolute monocyte count 1.0, absolute eosinophil 0.5, BUN 38, creatinine 1.28 with a GFR of 47. Review of prior lab work in epic and care everywhere shows that the patient has had normocytic anemia dating back to at least June 2021.  The patient reports that she is experiencing fatigue and dizziness.  She is her dizziness to her cardiac medications.  She has ongoing shortness of breath secondary to CHF and chest discomfort and uses nitroglycerin .  She has been having persistent lower extremity edema despite use of diuretics.  She is currently receiving wound care for left lower extremity cellulitis.  Due to see wound clinic next week.  She reports pica.  She currently denies any bleeding other than hemorrhoids.  However, she reports that she is not losing significant blood from her hemorrhoids.  Last colonoscopy 2023 showed several polyps.  Last EGD was in 2015.  The patient reports that she has never required a blood transfusion.  She does take an oral iron tablet daily.  She also takes a vitamin B12 supplement daily.  Has never received IV iron.  There are no recent iron studies, vitamin B12, folate levels available for review.   Past Medical History:  Diagnosis Date   Anemia    Bladder dysfunction    Self urinary catheterization   Chronic pain syndrome    long term and current opiates   Complication of anesthesia    COPD (chronic obstructive pulmonary disease) (HCC)    DDD (degenerative disc disease)    Depression    Dysrhythmia    Bradycardia, A.fib   Esophageal spasm    NTG  and Norvasc   Essential hypertension    Fibromyalgia    Gastroparesis    GERD (gastroesophageal reflux disease)    History of cardiac catheterization    Normal coronary arteries   History of pituitary tumor    Irritable bowel syndrome    Lymphedema    Osteoarthritis    PAF (paroxysmal atrial fibrillation) (HCC)    Pneumonia    PONV (postoperative nausea and vomiting)    Psoriatic arthritis (HCC)    Rheumatoid arthritis (HCC)    Sleep apnea    BIPAP   Type 2 diabetes mellitus (HCC)    Urinary tract infection   :     Past Surgical History:  Procedure Laterality Date   ABDOMINAL HYSTERECTOMY  1997   ANTERIOR CERVICAL DECOMP/DISCECTOMY FUSION N/A 12/02/2014   Procedure: Cervical five cervical six anterior cervical decompression with fusion interbody prosthesis plating and bone graft;  Surgeon: Reyes Budge, MD;  Location: [facility];  Service: Neurosurgery;  Laterality: N/A;  C56 anterior cervical decompression with fusion interbody prosthesis plating and bonegraft   APPENDECTOMY  09/1997   BACK SURGERY     lumbar    1991 and 2009   CARDIAC CATHETERIZATION     2020   CARDIOVERSION N/A 03/26/2014   Procedure: CARDIOVERSION;  Surgeon: Pearla DELENA Rout, MD;  Location: [facility];  Service: Endoscopy;  Laterality: N/A;   CARDIOVERSION N/A 05/04/2014   Procedure: CARDIOVERSION;  Surgeon: Jayson KANDICE Sierras, MD;  Location: [facility] ORS;  Service: Cardiovascular;  Laterality: N/A;   CHOLECYSTECTOMY  1996   laparoscopic   COLONOSCOPY     Approximately 2003. Per medical records, internal hemorrhoids noted   COLONOSCOPY N/A 06/25/2013   Dr. Shaaron: Anal canal hemorrhoids-more likely the source of paper hematochezia. Redundant, capacious colon. Multiple colonic polyps-tubular adenoma   COLONOSCOPY WITH PROPOFOL  N/A 12/04/2016   Dr. Shaaron: redundant colon, colonic diverticulosis. Screening due in 2023   COLONOSCOPY WITH PROPOFOL  N/A 08/11/2021   Procedure: COLONOSCOPY WITH PROPOFOL ;   Surgeon: Shaaron Lamar HERO, MD;  Location: [facility];  Service: Endoscopy;  Laterality: N/A;  9:15am   ENDOVENOUS ABLATION SAPHENOUS VEIN W/ LASER Left 03/07/2018   endovenous ablation left greater saphenous vein by Lonni Blade MD    ESOPHAGOGASTRODUODENOSCOPY  10/12/2004   MFM:Qpwz plaquing on the esophageal mucosa of uncertain significance, not typical of what is seen with candida esophagitis status post KOH brushing for KOH prep and biopsy for histology. Rule out candida esophagitis/eosinophilic esophagitis. Otherwise normal esophagus. Tiny hiatal hernia. Otherwise, normal stomach, normal D1 and D2. Benign biopsy of esophagus, unknown KOH status.    ESOPHAGOGASTRODUODENOSCOPY N/A 06/25/2013   Dr. Rourk:mild chronic gastritis   HAND SURGERY Left 2012   ganglion excision and nerve repair   KNEE SURGERY Left 1997   arthroscopy and debridement   LEFT HEART CATHETERIZATION WITH CORONARY ANGIOGRAM N/A 11/15/2010   Procedure: LEFT HEART CATHETERIZATION WITH CORONARY ANGIOGRAM;  Surgeon: Erick JONELLE Bergamo, MD;  Location: [facility] CATH LAB;  Service: Cardiovascular;  Laterality: N/A;   LUNG BIOPSY  2007   benign per patient   POLYPECTOMY  08/11/2021   Procedure: POLYPECTOMY INTESTINAL;  Surgeon: Shaaron Lamar HERO, MD;  Location: [facility];  Service: Endoscopy;;   RIGHT/LEFT HEART CATH AND CORONARY ANGIOGRAPHY N/A 08/28/2018   Procedure: RIGHT/LEFT HEART CATH AND CORONARY ANGIOGRAPHY;  Surgeon: Verlin Lonni BIRCH, MD;  Location: [facility] IN[facility] CV LAB;  Service: Cardiovascular;  Laterality: N/A;   SHOULDER ARTHROSCOPY W/ ROTATOR CUFF REPAIR Left 02/16/2016  :   CURRENT MEDS: Current Outpatient Medications  Medication Sig Dispense Refill   albuterol  (VENTOLIN  HFA) 108 (90 Base) MCG/ACT inhaler Inhale 2 puffs into the lungs.     atorvastatin  (LIPITOR) 40 MG tablet Take 40 mg by mouth daily.     baclofen  (LIORESAL ) 20 MG tablet Take 20 mg by mouth 3 (three) times daily.     CARTIA  XT 120 MG  24 hr capsule TAKE ONE CAPSULE BY MOUTH EVERY DAY 90 capsule 3   dexlansoprazole  (DEXILANT ) 60 MG capsule Take 1 capsule (60 mg total) by mouth daily. 90 capsule 3   diclofenac sodium (VOLTAREN) 1 % GEL Apply 2-4 g topically 4 (four) times daily as needed (for pain).      docusate sodium  (COLACE) 100 MG capsule Take 100 mg by mouth 2 (two) times daily.     ferrous sulfate 325 (65 FE) MG EC tablet Take 325 mg by mouth daily with breakfast.     flecainide  (TAMBOCOR ) 50 MG tablet TAKE 2 TABLETS BY MOUTH EVERY MORNING and TAKE 2 TABLETS BY MOUTH EVERY EVENING 120 tablet 1   fluticasone  (FLONASE ) 50 MCG/ACT nasal spray INSTILL 2 SPRAYS IN EACH NOSTRIL EVERY DAY 16 g 6   gabapentin  (NEURONTIN ) 800 MG tablet Take 800 mg by mouth 4 (four) times daily.   2   glimepiride (AMARYL) 4 MG tablet Take 4 mg by mouth 2 (two) times daily.     hydrocortisone  (ANUSOL -HC) 2.5 %  rectal cream Place 1 Application rectally 2 (two) times daily. For rectal bleeding 30 g 1   isosorbide  mononitrate (IMDUR ) 120 MG 24 hr tablet Take 120 mg by mouth daily. In the morning     isosorbide  mononitrate (IMDUR ) 30 MG 24 hr tablet Take 30 mg by mouth every evening.     linaclotide  (LINZESS ) 290 MCG CAPS capsule Take 1 capsule (290 mcg total) by mouth daily before breakfast. 90 capsule 3   lisinopril  (PRINIVIL ,ZESTRIL ) 10 MG tablet Take 10 mg by mouth every evening.      loratadine  (CLARITIN ) 10 MG tablet Take 1 tablet (10 mg total) by mouth daily. 30 tablet 11   meclizine  (ANTIVERT ) 25 MG tablet Take 25 mg by mouth 3 (three) times daily.     metFORMIN  (GLUCOPHAGE ) 500 MG tablet Take 500-1,000 mg by mouth 2 (two) times daily. Take 1,000mg  in the Morning and 500 mg in the evening     metolazone  (ZAROXOLYN ) 2.5 MG tablet Take 1 tablet (2.5 mg total) by mouth daily. 30 Minutes before taking your torsemide . 90 tablet 1   Multiple Vitamin (MULTIVITAMIN WITH MINERALS) TABS tablet Take 1 tablet by mouth daily.     mupirocin ointment  (BACTROBAN) 2 % Apply 1 Application topically in the morning and at bedtime.     nitroGLYCERIN  (NITROLINGUAL ) 0.4 MG/SPRAY spray USE 1 SPRAY UNDER THE TONGUE EVERY 5 MINUTES UP TO 3 DOSES AS NEEDED FOR CHEST PAIN. GO TO ER IF NOT RESOLVED 12 g 3   NUCYNTA  ER 100 MG 12 hr tablet Take 100 mg by mouth 2 (two) times daily.     nystatin  (MYCOSTATIN /NYSTOP ) 100000 UNIT/GM POWD Apply 1 g topically daily.     ondansetron  (ZOFRAN -ODT) 8 MG disintegrating tablet Take 8 mg by mouth daily.     OTEZLA  30 MG TABS Take 1 tablet by mouth 2 (two) times daily.     oxyCODONE -acetaminophen  (PERCOCET) 10-325 MG per tablet Take 1 tablet by mouth every 4 (four) hours as needed for pain.     OXYGEN  Inhale 2 L into the lungs as needed (shortness of breath).     OZEMPIC, 1 MG/DOSE, 4 MG/3ML SOPN Inject 1 mg into the skin every Tuesday.     Prucalopride Succinate  (MOTEGRITY ) 2 MG TABS Take 1 tablet (2 mg total) by mouth daily. 90 tablet 3   rivaroxaban  (XARELTO ) 20 MG TABS tablet Take 20 mg by mouth daily with supper.      sodium chloride  (OCEAN) 0.65 % SOLN nasal spray Place 1 spray into both nostrils daily.     spironolactone  (ALDACTONE ) 50 MG tablet Take 50 mg by mouth 3 (three) times daily.     torsemide  40 MG TABS Take 40 mg by mouth 2 (two) times daily. 180 tablet 1   TOUJEO  SOLOSTAR 300 UNIT/ML Solostar Pen Inject 43 Units into the skin at bedtime.     umeclidinium-vilanterol (ANORO ELLIPTA ) 62.5-25 MCG/ACT AEPB Inhale 1 puff into the lungs daily. 60 each 11   VASCEPA  1 g CAPS Take 1 g by mouth 4 (four) times daily.     No current facility-administered medications for this visit.       Allergies  Allergen Reactions   Levofloxacin Other (See Comments)    Psychosis   Tape Other (See Comments)    Welts, bad place on skin. Paper tape is okay   Wound Dressing Adhesive Hives    Welts, bad place on skin. Paper tape is okay   Hyoscyamine Sulfate Palpitations  Metoclopramide Other (See Comments)    Tremors    :   Family History  Problem Relation Age of Onset   Coronary artery disease Father        Died age 42   Heart attack Father    Arrhythmia Father        AF   Diabetes Father    Parkinson's disease Father    Arrhythmia Mother        AF   Stroke Mother    Dementia Mother    Cancer Mother        UTERINE    Parkinson's disease Mother    Arrhythmia Brother        AF   Colon cancer Maternal Grandfather    Colon cancer Paternal Aunt    Colon cancer Paternal Aunt    Diabetes Son    Cancer Sister        BREAST    Depression Sister    Breast cancer Sister    Depression Sister    Depression Sister    Breast cancer Maternal Aunt   :   Social History   Socioeconomic History   Marital status: Divorced    Spouse name: Not on file   Number of children: 1   Years of education: Not on file   Highest education level: Not on file  Occupational History   Not on file  Tobacco Use   Smoking status: Former    Current packs/day: 0.00    Average packs/day: 1.5 packs/day for 30.0 years (45.0 ttl pk-yrs)    Types: Cigarettes    Start date: 03/06/1977    Quit date: 01/03/2007    Years since quitting: 16.7   Smokeless tobacco: Never  Vaping Use   Vaping status: Never Used  Substance and Sexual Activity   Alcohol  use: No    Alcohol /week: 0.0 standard drinks of alcohol    Drug use: No   Sexual activity: Not Currently  Other Topics Concern   Not on file  Social History Narrative   Not on file   Social Drivers of Health   Financial Resource Strain: Low Risk  (09/22/2022)   Overall Financial Resource Strain (CARDIA)    Difficulty of Paying Living Expenses: Not very hard  Food Insecurity: No Food Insecurity (05/03/2023)   Hunger Vital Sign    Worried About Running Out of Food in the Last Year: Never true    Ran Out of Food in the Last Year: Never true  Transportation Needs: No Transportation Needs (05/03/2023)   PRAPARE - Administrator, Civil Service (Medical): No    Lack  of Transportation (Non-Medical): No  Physical Activity: Inactive (08/18/2022)   Exercise Vital Sign    Days of Exercise per Week: 0 days    Minutes of Exercise per Session: 0 min  Stress: Not on file  Social Connections: Patient Declined (05/03/2023)   Social Connection and Isolation Panel    Frequency of Communication with Friends and Family: Patient declined    Frequency of Social Gatherings with Friends and Family: Patient declined    Attends Religious Services: Patient declined    Database administrator or Organizations: Patient declined    Attends Banker Meetings: Patient declined    Marital Status: Patient declined  Intimate Partner Violence: Not At Risk (05/03/2023)   Humiliation, Afraid, Rape, and Kick questionnaire    Fear of Current or Ex-Partner: No    Emotionally Abused: No    Physically Abused:  No    Sexually Abused: No  :  REVIEW OF SYSTEMS:  A comprehensive 14 point review of systems was negative except as noted in the HPI.    Exam: BP (!) 99/51 (BP Location: Left Arm, Patient Position: Sitting)   Pulse 64   Temp 98 F (36.7 C) (Oral)   Resp 18   SpO2 96%    Physical Exam Constitutional:      General: She is not in acute distress. HENT:     Head: Normocephalic.  Eyes:     General: No scleral icterus.    Conjunctiva/sclera: Conjunctivae normal.  Cardiovascular:     Rate and Rhythm: Normal rate.  Pulmonary:     Effort: Pulmonary effort is normal. No respiratory distress.     Breath sounds: Normal breath sounds.  Abdominal:     General: Bowel sounds are normal. There is no distension.     Palpations: Abdomen is soft.  Musculoskeletal:     Right lower leg: Edema present.     Left lower leg: Edema present.     Comments: Left lower extremity wrapped.   Skin:    General: Skin is warm and dry.  Neurological:     General: No focal deficit present.     Mental Status: She is alert and oriented to person, place, and time.  Psychiatric:         Mood and Affect: Mood normal.        Behavior: Behavior normal.        Thought Content: Thought content normal.        Judgment: Judgment normal.     LABS:  Lab Results  Component Value Date   WBC 11.5 (H) 05/18/2023   HGB 9.6 (L) 05/18/2023   HCT 30.8 (L) 05/18/2023   PLT 336 05/18/2023   GLUCOSE 102 (H) 05/18/2023   ALT 13 05/18/2023   AST 16 05/18/2023   NA 134 (L) 05/18/2023   K 4.5 05/18/2023   CL 99 05/18/2023   CREATININE 1.06 (H) 05/18/2023   BUN 40 (H) 05/18/2023   CO2 26 05/18/2023   INR 1.1 05/02/2023   HGBA1C 5.8 (H) 05/02/2023    No results found.   ASSESSMENT AND PLAN: 1.  Normocytic anemia: The patient has normocytic anemia dating back to at least June 2021.  Potential etiologies of normocytic anemia discussed with the patient today including nutritional deficiencies, acute blood loss, infection, renal failure, liver disease, medications, chronic inflammation, hemolysis.  Today, will obtain additional workup including a repeat CBC, CMP, iron studies, vitamin B12 level, folate level, methylmalonic acid level, copper level.  Will obtain hemolysis markers including reticulocytes, haptoglobin, LDH.  Will also check serum protein electrophoresis and light chains.  I discussed with the patient that I suspect that her anemia is due to underlying CKD/chronic disease.  Will await workup to make further recommendations pending results.  2.  Leukocytosis: Noted to have a mildly elevated white blood cell count.  She does have intermittent cellulitis.  Will obtain technologist review of peripheral blood smear today.  Consider additional workup if white blood cell count becomes further elevated.  3.  Stage IIIa CKD: Most recent creatinine was 1.28 with a GFR 47.  This is stage IIIa CKD.  Updated renal function pending today.  4.  HFpEF: Currently being followed by cardiology who is adjusting diuretics.  Continue to follow up with cardiology.  5.  Paroxysmal atrial  fibrillation: Heart rate is controlled today.  Currently on diltiazem  and  flecainide .  She is also on Xarelto .  Continue to follow with cardiology.  6.  COPD/OSA on BiPAP: Respiratory status is stable at this time.  Currently on BiPAP and also uses oxygen  with BiPAP.  Follow-up: 2 to 3 weeks to review results and make further recommendations.   Thank you for this referral.  Liliani Bobo, DNP, AGPCNP-BC, AOCNP

## 2023-09-15 LAB — HAPTOGLOBIN: Haptoglobin: 314 mg/dL (ref 37–355)

## 2023-09-17 ENCOUNTER — Ambulatory Visit: Admitting: Cardiology

## 2023-09-17 DIAGNOSIS — D649 Anemia, unspecified: Secondary | ICD-10-CM | POA: Diagnosis not present

## 2023-09-17 DIAGNOSIS — L03116 Cellulitis of left lower limb: Secondary | ICD-10-CM | POA: Diagnosis not present

## 2023-09-17 DIAGNOSIS — J9601 Acute respiratory failure with hypoxia: Secondary | ICD-10-CM | POA: Diagnosis not present

## 2023-09-17 DIAGNOSIS — I4821 Permanent atrial fibrillation: Secondary | ICD-10-CM | POA: Diagnosis not present

## 2023-09-17 DIAGNOSIS — M797 Fibromyalgia: Secondary | ICD-10-CM | POA: Diagnosis not present

## 2023-09-17 DIAGNOSIS — I890 Lymphedema, not elsewhere classified: Secondary | ICD-10-CM | POA: Diagnosis not present

## 2023-09-17 DIAGNOSIS — I5033 Acute on chronic diastolic (congestive) heart failure: Secondary | ICD-10-CM | POA: Diagnosis not present

## 2023-09-17 DIAGNOSIS — L03115 Cellulitis of right lower limb: Secondary | ICD-10-CM | POA: Diagnosis not present

## 2023-09-17 DIAGNOSIS — N8110 Cystocele, unspecified: Secondary | ICD-10-CM | POA: Diagnosis not present

## 2023-09-17 DIAGNOSIS — G4730 Sleep apnea, unspecified: Secondary | ICD-10-CM | POA: Diagnosis not present

## 2023-09-17 DIAGNOSIS — E1142 Type 2 diabetes mellitus with diabetic polyneuropathy: Secondary | ICD-10-CM | POA: Diagnosis not present

## 2023-09-17 DIAGNOSIS — K3184 Gastroparesis: Secondary | ICD-10-CM | POA: Diagnosis not present

## 2023-09-17 DIAGNOSIS — E1143 Type 2 diabetes mellitus with diabetic autonomic (poly)neuropathy: Secondary | ICD-10-CM | POA: Diagnosis not present

## 2023-09-17 DIAGNOSIS — M069 Rheumatoid arthritis, unspecified: Secondary | ICD-10-CM | POA: Diagnosis not present

## 2023-09-17 DIAGNOSIS — L97828 Non-pressure chronic ulcer of other part of left lower leg with other specified severity: Secondary | ICD-10-CM | POA: Diagnosis not present

## 2023-09-17 DIAGNOSIS — E1165 Type 2 diabetes mellitus with hyperglycemia: Secondary | ICD-10-CM | POA: Diagnosis not present

## 2023-09-17 DIAGNOSIS — E11620 Type 2 diabetes mellitus with diabetic dermatitis: Secondary | ICD-10-CM | POA: Diagnosis not present

## 2023-09-17 DIAGNOSIS — M4800 Spinal stenosis, site unspecified: Secondary | ICD-10-CM | POA: Diagnosis not present

## 2023-09-17 DIAGNOSIS — J449 Chronic obstructive pulmonary disease, unspecified: Secondary | ICD-10-CM | POA: Diagnosis not present

## 2023-09-17 DIAGNOSIS — S90934D Unspecified superficial injury of right lesser toe(s), subsequent encounter: Secondary | ICD-10-CM | POA: Diagnosis not present

## 2023-09-17 DIAGNOSIS — I110 Hypertensive heart disease with heart failure: Secondary | ICD-10-CM | POA: Diagnosis not present

## 2023-09-17 LAB — KAPPA/LAMBDA LIGHT CHAINS
Kappa free light chain: 30.1 mg/L — ABNORMAL HIGH (ref 3.3–19.4)
Kappa, lambda light chain ratio: 1.04 (ref 0.26–1.65)
Lambda free light chains: 29 mg/L — ABNORMAL HIGH (ref 5.7–26.3)

## 2023-09-17 LAB — PROTEIN ELECTROPHORESIS, SERUM, WITH REFLEX
A/G Ratio: 0.9 (ref 0.7–1.7)
Albumin ELP: 3.3 g/dL (ref 2.9–4.4)
Alpha-1-Globulin: 0.2 g/dL (ref 0.0–0.4)
Alpha-2-Globulin: 0.9 g/dL (ref 0.4–1.0)
Beta Globulin: 1.1 g/dL (ref 0.7–1.3)
Gamma Globulin: 1.3 g/dL (ref 0.4–1.8)
Globulin, Total: 3.6 g/dL (ref 2.2–3.9)
Total Protein ELP: 6.9 g/dL (ref 6.0–8.5)

## 2023-09-18 DIAGNOSIS — M5136 Other intervertebral disc degeneration, lumbar region with discogenic back pain only: Secondary | ICD-10-CM | POA: Diagnosis not present

## 2023-09-18 DIAGNOSIS — M8580 Other specified disorders of bone density and structure, unspecified site: Secondary | ICD-10-CM | POA: Diagnosis not present

## 2023-09-18 DIAGNOSIS — R110 Nausea: Secondary | ICD-10-CM | POA: Diagnosis not present

## 2023-09-18 DIAGNOSIS — L03116 Cellulitis of left lower limb: Secondary | ICD-10-CM | POA: Diagnosis not present

## 2023-09-18 DIAGNOSIS — M170 Bilateral primary osteoarthritis of knee: Secondary | ICD-10-CM | POA: Diagnosis not present

## 2023-09-18 DIAGNOSIS — M79674 Pain in right toe(s): Secondary | ICD-10-CM | POA: Diagnosis not present

## 2023-09-18 DIAGNOSIS — L4050 Arthropathic psoriasis, unspecified: Secondary | ICD-10-CM | POA: Diagnosis not present

## 2023-09-18 DIAGNOSIS — L409 Psoriasis, unspecified: Secondary | ICD-10-CM | POA: Diagnosis not present

## 2023-09-18 DIAGNOSIS — R768 Other specified abnormal immunological findings in serum: Secondary | ICD-10-CM | POA: Diagnosis not present

## 2023-09-18 LAB — COPPER, SERUM: Copper: 130 ug/dL (ref 80–158)

## 2023-09-19 ENCOUNTER — Ambulatory Visit: Attending: Cardiology | Admitting: Cardiology

## 2023-09-19 ENCOUNTER — Telehealth: Payer: Self-pay | Admitting: Nurse Practitioner

## 2023-09-19 ENCOUNTER — Other Ambulatory Visit: Payer: Self-pay | Admitting: Cardiology

## 2023-09-19 ENCOUNTER — Encounter: Payer: Self-pay | Admitting: Cardiology

## 2023-09-19 VITALS — BP 110/62 | HR 62 | Ht 68.0 in | Wt 329.0 lb

## 2023-09-19 DIAGNOSIS — I890 Lymphedema, not elsewhere classified: Secondary | ICD-10-CM | POA: Diagnosis not present

## 2023-09-19 DIAGNOSIS — I480 Paroxysmal atrial fibrillation: Secondary | ICD-10-CM | POA: Diagnosis not present

## 2023-09-19 DIAGNOSIS — I2089 Other forms of angina pectoris: Secondary | ICD-10-CM

## 2023-09-19 DIAGNOSIS — N1831 Chronic kidney disease, stage 3a: Secondary | ICD-10-CM

## 2023-09-19 LAB — METHYLMALONIC ACID, SERUM: Methylmalonic Acid, Quantitative: 320 nmol/L (ref 0–378)

## 2023-09-19 NOTE — Progress Notes (Signed)
 Cardiology Office Note  Date: 09/19/2023   ID: [name] [name], DOB 05-29-1960, MRN [id number]  History of Present Illness: [name] [name] is a 63 y.o. female seen recently by Ms. Peck NP in August.  She is here today for a follow-up visit.  I reviewed interval chart.  She has been working on improvement in leg edema complicated by lymphedema and venous stasis with associated cellulitis.  She is going to the wound clinic and also following with [facility], I reviewed the recent note. She also had a recent visit with hematology for evaluation of normocytic anemia.  She states that episodes of chest pain have settled down in the last few weeks.  Still uses nitroglycerin  intermittently.  Medications reviewed.  She remains on stable diuretic regimen.  Reports no spontaneous bleeding problems on Xarelto .  Her atrial fibrillation control has been stable, still has intermittent palpitations, but no prolonged episodes.  I reviewed her recent ECG from late August.  Physical Exam: VS:  BP 110/62 (BP Location: Left Arm)   Pulse 62   Ht 5' 8 (1.727 m)   Wt (!) 329 lb (149.2 kg)   SpO2 93%   BMI 50.02 kg/m , BMI Body mass index is 50.02 kg/m.  Wt Readings from Last 3 Encounters:  09/19/23 (!) 329 lb (149.2 kg)  08/31/23 (!) 331 lb (150.1 kg)  08/02/23 (!) 336 lb (152.4 kg)    General: Patient appears comfortable at rest.  In motorized wheelchair. HEENT: Conjunctiva and lids normal. Neck: Supple, no elevated JVP or carotid bruits. Lungs: Clear to auscultation, nonlabored breathing at rest. Cardiac: Regular rate and rhythm, no S3, 2/6 systolic murmur. Extremities: Chronic appearing lymphedema with venous stasis, Unna boot on left leg.  ECG:  An ECG dated 08/31/2023 was personally reviewed today and demonstrated:  Sinus rhythm with prolonged PR interval and IVCD/LVH.  Labwork: June 2024: Cholesterol 95, triglycerides 201, HDL 28, LDL 35 05/02/2023: B Natriuretic Peptide 13.0 09/14/2023: ALT 12;  AST 17; BUN 33; Creatinine, Ser 1.32; Hemoglobin 9.7; Platelets 345; Potassium 4.1; Sodium 136   Other Studies Reviewed Today:  Echocardiogram 01/12/2022:  1. Left ventricular ejection fraction, by estimation, is 70 to 75%. The  left ventricle has hyperdynamic function. Left ventricular endocardial  border not optimally defined to evaluate regional wall motion. Left  ventricular diastolic parameters are  consistent with Grade I diastolic dysfunction (impaired relaxation).  Elevated left atrial pressure. The average left ventricular global  longitudinal strain is -31.2 %. The global longitudinal strain is normal.   2. Right ventricular systolic function is normal. The right ventricular  size is normal. There is normal pulmonary artery systolic pressure.   3. The mitral valve is normal in structure. No evidence of mitral valve  regurgitation. No evidence of mitral stenosis.   4. The aortic valve has an indeterminant number of cusps. There is mild  calcification of the aortic valve. There is mild thickening of the aortic  valve. Aortic valve regurgitation is not visualized. No aortic stenosis is  present.   5. The inferior vena cava is normal in size with greater than 50%  respiratory variability, suggesting right atrial pressure of 3 mmHg.   Assessment and Plan:  1.  Paroxysmal atrial fibrillation with CHA2DS2-VASc score of 3.  No increasing palpitations or prolonged events on current regimen.  Continue Xarelto  20 mg daily for stroke prophylaxis.  She is on flecainide  50 mg twice daily along with Cartia  XT 120 mg daily.  Creatinine clearance  104.   2.  Lymphedema.  She has had recurring problems with leg edema, cellulitis, and venous stasis ulcers.  Currently following with wound care and also [facility].  Has Unna boot on the left leg.  Home health nursing in place.   3.  OSA on BiPAP.   4.  Intermittent chest pain responsive to nitroglycerin .  Cardiac catheterization from 2020 showed no  significant CAD.  Likely has component of microvascular angina.  Reports fewer symptoms of late.  Continue Lipitor 40 mg daily, Imdur  120 mg in the morning with 30 mg in the evening and as needed nitroglycerin .  5.  Fluid retention, possible HFpEF.  LVEF 70 to 75% by echocardiogram in January with mild diastolic dysfunction at that time and normal RV contraction with normal estimated RVSP.  She continues on Aldactone  50 mg 3 times a day, Demadex  40 mg twice daily, and Zaroxolyn  2.5 mg daily.  Disposition:  Follow up 3 months.  Signed, Jayson JUDITHANN Sierras, M.D., F.A.C.C. [facility]

## 2023-09-19 NOTE — Telephone Encounter (Signed)
 Pt said Almarie has [facility] to [facility] she is wondering if she needs to follow up with elizabeth as well!

## 2023-09-19 NOTE — Telephone Encounter (Signed)
 Advised that she needed to follow up with either Debera or Miriam and whomever had availability. Verbalized understanding.

## 2023-09-19 NOTE — Patient Instructions (Addendum)
 Medication Instructions:   Your physician recommends that you continue on your current medications as directed. Please refer to the Current Medication list given to you today.  Labwork:  none  Testing/Procedures:  none  Follow-Up:  Your physician recommends that you schedule a follow-up appointment in: 3 months.  Any Other Special Instructions Will Be Listed Below (If Applicable).  If you need a refill on your cardiac medications before your next appointment, please call your pharmacy.

## 2023-09-20 ENCOUNTER — Encounter (HOSPITAL_BASED_OUTPATIENT_CLINIC_OR_DEPARTMENT_OTHER): Attending: Internal Medicine | Admitting: Internal Medicine

## 2023-09-20 DIAGNOSIS — I87312 Chronic venous hypertension (idiopathic) with ulcer of left lower extremity: Secondary | ICD-10-CM | POA: Insufficient documentation

## 2023-09-20 DIAGNOSIS — I890 Lymphedema, not elsewhere classified: Secondary | ICD-10-CM | POA: Insufficient documentation

## 2023-09-20 DIAGNOSIS — I739 Peripheral vascular disease, unspecified: Secondary | ICD-10-CM | POA: Diagnosis not present

## 2023-09-20 DIAGNOSIS — L97821 Non-pressure chronic ulcer of other part of left lower leg limited to breakdown of skin: Secondary | ICD-10-CM | POA: Insufficient documentation

## 2023-09-22 DIAGNOSIS — I890 Lymphedema, not elsewhere classified: Secondary | ICD-10-CM | POA: Diagnosis not present

## 2023-09-22 DIAGNOSIS — Z794 Long term (current) use of insulin: Secondary | ICD-10-CM | POA: Diagnosis not present

## 2023-09-22 DIAGNOSIS — I4821 Permanent atrial fibrillation: Secondary | ICD-10-CM | POA: Diagnosis not present

## 2023-09-22 DIAGNOSIS — E11620 Type 2 diabetes mellitus with diabetic dermatitis: Secondary | ICD-10-CM | POA: Diagnosis not present

## 2023-09-22 DIAGNOSIS — J449 Chronic obstructive pulmonary disease, unspecified: Secondary | ICD-10-CM | POA: Diagnosis not present

## 2023-09-22 DIAGNOSIS — K3184 Gastroparesis: Secondary | ICD-10-CM | POA: Diagnosis not present

## 2023-09-22 DIAGNOSIS — I5033 Acute on chronic diastolic (congestive) heart failure: Secondary | ICD-10-CM | POA: Diagnosis not present

## 2023-09-22 DIAGNOSIS — M4800 Spinal stenosis, site unspecified: Secondary | ICD-10-CM | POA: Diagnosis not present

## 2023-09-22 DIAGNOSIS — Z7901 Long term (current) use of anticoagulants: Secondary | ICD-10-CM | POA: Diagnosis not present

## 2023-09-22 DIAGNOSIS — G4730 Sleep apnea, unspecified: Secondary | ICD-10-CM | POA: Diagnosis not present

## 2023-09-22 DIAGNOSIS — M797 Fibromyalgia: Secondary | ICD-10-CM | POA: Diagnosis not present

## 2023-09-22 DIAGNOSIS — L97821 Non-pressure chronic ulcer of other part of left lower leg limited to breakdown of skin: Secondary | ICD-10-CM | POA: Diagnosis not present

## 2023-09-22 DIAGNOSIS — D649 Anemia, unspecified: Secondary | ICD-10-CM | POA: Diagnosis not present

## 2023-09-22 DIAGNOSIS — N8110 Cystocele, unspecified: Secondary | ICD-10-CM | POA: Diagnosis not present

## 2023-09-22 DIAGNOSIS — E1143 Type 2 diabetes mellitus with diabetic autonomic (poly)neuropathy: Secondary | ICD-10-CM | POA: Diagnosis not present

## 2023-09-22 DIAGNOSIS — M069 Rheumatoid arthritis, unspecified: Secondary | ICD-10-CM | POA: Diagnosis not present

## 2023-09-22 DIAGNOSIS — Z9981 Dependence on supplemental oxygen: Secondary | ICD-10-CM | POA: Diagnosis not present

## 2023-09-22 DIAGNOSIS — E1142 Type 2 diabetes mellitus with diabetic polyneuropathy: Secondary | ICD-10-CM | POA: Diagnosis not present

## 2023-09-22 DIAGNOSIS — I110 Hypertensive heart disease with heart failure: Secondary | ICD-10-CM | POA: Diagnosis not present

## 2023-09-22 DIAGNOSIS — Z7985 Long-term (current) use of injectable non-insulin antidiabetic drugs: Secondary | ICD-10-CM | POA: Diagnosis not present

## 2023-09-22 DIAGNOSIS — Z7984 Long term (current) use of oral hypoglycemic drugs: Secondary | ICD-10-CM | POA: Diagnosis not present

## 2023-09-25 DIAGNOSIS — G4730 Sleep apnea, unspecified: Secondary | ICD-10-CM | POA: Diagnosis not present

## 2023-09-25 DIAGNOSIS — M797 Fibromyalgia: Secondary | ICD-10-CM | POA: Diagnosis not present

## 2023-09-25 DIAGNOSIS — Z794 Long term (current) use of insulin: Secondary | ICD-10-CM | POA: Diagnosis not present

## 2023-09-25 DIAGNOSIS — E1143 Type 2 diabetes mellitus with diabetic autonomic (poly)neuropathy: Secondary | ICD-10-CM | POA: Diagnosis not present

## 2023-09-25 DIAGNOSIS — J449 Chronic obstructive pulmonary disease, unspecified: Secondary | ICD-10-CM | POA: Diagnosis not present

## 2023-09-25 DIAGNOSIS — Z9981 Dependence on supplemental oxygen: Secondary | ICD-10-CM | POA: Diagnosis not present

## 2023-09-25 DIAGNOSIS — I5033 Acute on chronic diastolic (congestive) heart failure: Secondary | ICD-10-CM | POA: Diagnosis not present

## 2023-09-25 DIAGNOSIS — E1142 Type 2 diabetes mellitus with diabetic polyneuropathy: Secondary | ICD-10-CM | POA: Diagnosis not present

## 2023-09-25 DIAGNOSIS — I890 Lymphedema, not elsewhere classified: Secondary | ICD-10-CM | POA: Diagnosis not present

## 2023-09-25 DIAGNOSIS — Z7901 Long term (current) use of anticoagulants: Secondary | ICD-10-CM | POA: Diagnosis not present

## 2023-09-25 DIAGNOSIS — M4800 Spinal stenosis, site unspecified: Secondary | ICD-10-CM | POA: Diagnosis not present

## 2023-09-25 DIAGNOSIS — L97821 Non-pressure chronic ulcer of other part of left lower leg limited to breakdown of skin: Secondary | ICD-10-CM | POA: Diagnosis not present

## 2023-09-25 DIAGNOSIS — D649 Anemia, unspecified: Secondary | ICD-10-CM | POA: Diagnosis not present

## 2023-09-25 DIAGNOSIS — M069 Rheumatoid arthritis, unspecified: Secondary | ICD-10-CM | POA: Diagnosis not present

## 2023-09-25 DIAGNOSIS — Z7984 Long term (current) use of oral hypoglycemic drugs: Secondary | ICD-10-CM | POA: Diagnosis not present

## 2023-09-25 DIAGNOSIS — E11620 Type 2 diabetes mellitus with diabetic dermatitis: Secondary | ICD-10-CM | POA: Diagnosis not present

## 2023-09-25 DIAGNOSIS — N8110 Cystocele, unspecified: Secondary | ICD-10-CM | POA: Diagnosis not present

## 2023-09-25 DIAGNOSIS — I4821 Permanent atrial fibrillation: Secondary | ICD-10-CM | POA: Diagnosis not present

## 2023-09-25 DIAGNOSIS — Z7985 Long-term (current) use of injectable non-insulin antidiabetic drugs: Secondary | ICD-10-CM | POA: Diagnosis not present

## 2023-09-25 DIAGNOSIS — K3184 Gastroparesis: Secondary | ICD-10-CM | POA: Diagnosis not present

## 2023-09-25 DIAGNOSIS — I110 Hypertensive heart disease with heart failure: Secondary | ICD-10-CM | POA: Diagnosis not present

## 2023-09-27 ENCOUNTER — Encounter (HOSPITAL_BASED_OUTPATIENT_CLINIC_OR_DEPARTMENT_OTHER): Admitting: Internal Medicine

## 2023-09-27 DIAGNOSIS — L97821 Non-pressure chronic ulcer of other part of left lower leg limited to breakdown of skin: Secondary | ICD-10-CM

## 2023-09-27 DIAGNOSIS — I890 Lymphedema, not elsewhere classified: Secondary | ICD-10-CM | POA: Diagnosis not present

## 2023-09-27 DIAGNOSIS — I739 Peripheral vascular disease, unspecified: Secondary | ICD-10-CM | POA: Diagnosis not present

## 2023-09-27 DIAGNOSIS — I87312 Chronic venous hypertension (idiopathic) with ulcer of left lower extremity: Secondary | ICD-10-CM | POA: Diagnosis not present

## 2023-10-01 DIAGNOSIS — G4733 Obstructive sleep apnea (adult) (pediatric): Secondary | ICD-10-CM | POA: Diagnosis not present

## 2023-10-01 DIAGNOSIS — J449 Chronic obstructive pulmonary disease, unspecified: Secondary | ICD-10-CM | POA: Diagnosis not present

## 2023-10-01 DIAGNOSIS — E1142 Type 2 diabetes mellitus with diabetic polyneuropathy: Secondary | ICD-10-CM | POA: Diagnosis not present

## 2023-10-01 DIAGNOSIS — D649 Anemia, unspecified: Secondary | ICD-10-CM | POA: Diagnosis not present

## 2023-10-01 DIAGNOSIS — I110 Hypertensive heart disease with heart failure: Secondary | ICD-10-CM | POA: Diagnosis not present

## 2023-10-01 DIAGNOSIS — L97821 Non-pressure chronic ulcer of other part of left lower leg limited to breakdown of skin: Secondary | ICD-10-CM | POA: Diagnosis not present

## 2023-10-01 DIAGNOSIS — Z7985 Long-term (current) use of injectable non-insulin antidiabetic drugs: Secondary | ICD-10-CM | POA: Diagnosis not present

## 2023-10-01 DIAGNOSIS — M069 Rheumatoid arthritis, unspecified: Secondary | ICD-10-CM | POA: Diagnosis not present

## 2023-10-01 DIAGNOSIS — I890 Lymphedema, not elsewhere classified: Secondary | ICD-10-CM | POA: Diagnosis not present

## 2023-10-01 DIAGNOSIS — Z9981 Dependence on supplemental oxygen: Secondary | ICD-10-CM | POA: Diagnosis not present

## 2023-10-01 DIAGNOSIS — E11620 Type 2 diabetes mellitus with diabetic dermatitis: Secondary | ICD-10-CM | POA: Diagnosis not present

## 2023-10-01 DIAGNOSIS — M797 Fibromyalgia: Secondary | ICD-10-CM | POA: Diagnosis not present

## 2023-10-01 DIAGNOSIS — I4821 Permanent atrial fibrillation: Secondary | ICD-10-CM | POA: Diagnosis not present

## 2023-10-01 DIAGNOSIS — G4730 Sleep apnea, unspecified: Secondary | ICD-10-CM | POA: Diagnosis not present

## 2023-10-01 DIAGNOSIS — Z7984 Long term (current) use of oral hypoglycemic drugs: Secondary | ICD-10-CM | POA: Diagnosis not present

## 2023-10-01 DIAGNOSIS — M4800 Spinal stenosis, site unspecified: Secondary | ICD-10-CM | POA: Diagnosis not present

## 2023-10-01 DIAGNOSIS — Z7901 Long term (current) use of anticoagulants: Secondary | ICD-10-CM | POA: Diagnosis not present

## 2023-10-01 DIAGNOSIS — N8110 Cystocele, unspecified: Secondary | ICD-10-CM | POA: Diagnosis not present

## 2023-10-01 DIAGNOSIS — Z794 Long term (current) use of insulin: Secondary | ICD-10-CM | POA: Diagnosis not present

## 2023-10-01 DIAGNOSIS — I5033 Acute on chronic diastolic (congestive) heart failure: Secondary | ICD-10-CM | POA: Diagnosis not present

## 2023-10-01 DIAGNOSIS — K3184 Gastroparesis: Secondary | ICD-10-CM | POA: Diagnosis not present

## 2023-10-01 DIAGNOSIS — E1143 Type 2 diabetes mellitus with diabetic autonomic (poly)neuropathy: Secondary | ICD-10-CM | POA: Diagnosis not present

## 2023-10-02 ENCOUNTER — Inpatient Hospital Stay (HOSPITAL_BASED_OUTPATIENT_CLINIC_OR_DEPARTMENT_OTHER): Admitting: Oncology

## 2023-10-02 VITALS — BP 114/54 | HR 64 | Temp 98.0°F | Resp 19

## 2023-10-02 DIAGNOSIS — D508 Other iron deficiency anemias: Secondary | ICD-10-CM | POA: Diagnosis not present

## 2023-10-02 DIAGNOSIS — N1831 Chronic kidney disease, stage 3a: Secondary | ICD-10-CM

## 2023-10-02 DIAGNOSIS — E1165 Type 2 diabetes mellitus with hyperglycemia: Secondary | ICD-10-CM | POA: Diagnosis not present

## 2023-10-02 DIAGNOSIS — D72829 Elevated white blood cell count, unspecified: Secondary | ICD-10-CM | POA: Diagnosis not present

## 2023-10-02 DIAGNOSIS — D649 Anemia, unspecified: Secondary | ICD-10-CM | POA: Diagnosis not present

## 2023-10-02 NOTE — Progress Notes (Signed)
 [facility] OFFICE PROGRESS NOTE  [name] Leta NOVAK, MD  ASSESSMENT & PLAN:    Assessment & Plan Other iron deficiency anemia The patient has normocytic anemia dating back to at least June 2021.  Reviewed most recent lab results which show mildly low iron levels with iron saturations of 8% with normal TIBC.  Hemoglobin 9.7.  B12 level slightly elevated at 1185.  Normal copper  levels.  Folate WNL.  SPEP WNL.  No evidence of hemolysis.  CMP shows mild CKD with creatinine 1.32.  Peripheral smear is unremarkable.   Discussed starting oral iron versus IV iron based on patient preference and insurance. Return to clinic in 10 to 12 weeks for follow-up.  Leukocytosis, unspecified type Noted to have a mildly elevated white blood cell count.  She does have intermittent cellulitis.  Will obtain technologist review of peripheral blood smear today.  Consider additional workup if white blood cell count becomes further elevated.   Stage 3a chronic kidney disease (HCC) Mild CKD with creatinine 1.3-1 in this lab drawn. Continue to monitor and likely contributing some to his anemia. Repeat labs in 10 to 12 weeks.  Orders Placed This Encounter  Procedures   Ferritin    Standing Status:   Future    Expected Date:   12/26/2023    Expiration Date:   03/25/2024   Iron and TIBC    Standing Status:   Future    Expected Date:   12/26/2023    Expiration Date:   03/25/2024   Vitamin B12    Standing Status:   Future    Expected Date:   12/26/2023    Expiration Date:   03/25/2024   CBC with Differential/Platelet    Standing Status:   Future    Expected Date:   12/26/2023    Expiration Date:   03/25/2024   Comprehensive metabolic panel    Standing Status:   Future    Expected Date:   12/26/2023    Expiration Date:   11/25/2024    INTERVAL HISTORY: Patient returns for follow-up and to review most recent results.  She reports an appetite of 100% energy levels are low.  Has occasional  shortness of breath and chest pain at times.  Has occasional leg swelling, constipation, nausea and dizziness.  We reviewed ferritin, iron panel, B12, folate, SPEP, reticulocytes, CBC, copper , MMA, kappa lambda light chains, haptoglobin, LDH, CMP and CBC with check smear.  SUMMARY OF HEMATOLOGIC HISTORY: Oncology History   No history exists.     CBC    Component Value Date/Time   WBC 10.8 (H) 09/14/2023 1345   RBC 3.41 (L) 09/14/2023 1346   RBC 3.41 (L) 09/14/2023 1345   HGB 9.7 (L) 09/14/2023 1345   HGB 12.8 05/28/2013 0000   HCT 30.1 (L) 09/14/2023 1345   HCT 39 05/28/2013 0000   PLT 345 09/14/2023 1345   MCV 88.3 09/14/2023 1345   MCH 28.4 09/14/2023 1345   MCHC 32.2 09/14/2023 1345   RDW 14.6 09/14/2023 1345   LYMPHSABS 2.2 09/14/2023 1345   MONOABS 0.7 09/14/2023 1345   EOSABS 0.4 09/14/2023 1345   BASOSABS 0.1 09/14/2023 1345       Latest Ref Rng & Units 09/14/2023    1:45 PM 05/18/2023    2:01 PM 05/04/2023    4:36 AM  CMP  Glucose 70 - 99 mg/dL 79  897  885   BUN 8 - 23 mg/dL 33  40  26   Creatinine  0.44 - 1.00 mg/dL 8.67  8.93  9.09   Sodium 135 - 145 mmol/L 136  134  136   Potassium 3.5 - 5.1 mmol/L 4.1  4.5  3.5   Chloride 98 - 111 mmol/L 98  99  96   CO2 22 - 32 mmol/L 27  26  26    Calcium  8.9 - 10.3 mg/dL 9.9  9.3  9.9   Total Protein 6.5 - 8.1 g/dL 7.6  7.0    Total Bilirubin 0.0 - 1.2 mg/dL 0.4  0.5    Alkaline Phos 38 - 126 U/L 65  63    AST 15 - 41 U/L 17  16    ALT 0 - 44 U/L 12  13       Lab Results  Component Value Date   FERRITIN 66 09/14/2023   VITAMINB12 1,185 (H) 09/14/2023    Vitals:   10/02/23 1320  BP: (!) 114/54  Pulse: 64  Resp: 19  Temp: 98 F (36.7 C)  SpO2: 100%    Review of System:  Review of Systems  Constitutional:  Positive for malaise/fatigue.  Respiratory:  Positive for shortness of breath.   Cardiovascular:  Positive for chest pain and leg swelling.  Gastrointestinal:  Positive for constipation and nausea.   Neurological:  Positive for dizziness.    Physical Exam: Physical Exam Constitutional:      Appearance: Normal appearance.  HENT:     Head: Normocephalic and atraumatic.  Eyes:     Pupils: Pupils are equal, round, and reactive to light.  Cardiovascular:     Rate and Rhythm: Normal rate and regular rhythm.     Heart sounds: Normal heart sounds. No murmur heard. Pulmonary:     Effort: Pulmonary effort is normal.     Breath sounds: Normal breath sounds. No wheezing.  Abdominal:     General: Bowel sounds are normal. There is no distension.     Palpations: Abdomen is soft.     Tenderness: There is no abdominal tenderness.  Musculoskeletal:        General: Normal range of motion.     Cervical back: Normal range of motion.  Skin:    General: Skin is warm and dry.     Findings: No rash.  Neurological:     Mental Status: She is alert and oriented to person, place, and time.     Gait: Gait is intact.  Psychiatric:        Mood and Affect: Mood and affect normal.        Cognition and Memory: Memory normal.        Judgment: Judgment normal.      I spent 20 minutes dedicated to the care of this patient (face-to-face and non-face-to-face) on the date of the encounter to include what is described in the assessment and plan.,  Delon Hope, NP 11/26/2023 10:23 AM

## 2023-10-02 NOTE — Assessment & Plan Note (Addendum)
 Noted to have a mildly elevated white blood cell count.  She does have intermittent cellulitis.  Will obtain technologist review of peripheral blood smear today.  Consider additional workup if white blood cell count becomes further elevated.

## 2023-10-02 NOTE — Assessment & Plan Note (Addendum)
 The patient has normocytic anemia dating back to at least June 2021.  Reviewed most recent lab results which show mildly low iron levels with iron saturations of 8% with normal TIBC.  Hemoglobin 9.7.  B12 level slightly elevated at 1185.  Normal copper  levels.  Folate WNL.  SPEP WNL.  No evidence of hemolysis.  CMP shows mild CKD with creatinine 1.32.  Peripheral smear is unremarkable.   Discussed starting oral iron versus IV iron based on patient preference and insurance. Return to clinic in 10 to 12 weeks for follow-up.

## 2023-10-03 DIAGNOSIS — D509 Iron deficiency anemia, unspecified: Secondary | ICD-10-CM | POA: Insufficient documentation

## 2023-10-05 ENCOUNTER — Inpatient Hospital Stay: Attending: Oncology

## 2023-10-05 VITALS — BP 105/58 | HR 61 | Temp 97.7°F | Resp 18

## 2023-10-05 DIAGNOSIS — D509 Iron deficiency anemia, unspecified: Secondary | ICD-10-CM

## 2023-10-05 DIAGNOSIS — D508 Other iron deficiency anemias: Secondary | ICD-10-CM | POA: Diagnosis present

## 2023-10-05 MED ORDER — SODIUM CHLORIDE 0.9 % IV SOLN: INTRAVENOUS | Status: DC

## 2023-10-05 MED ORDER — ACETAMINOPHEN 325 MG PO TABS
650.0000 mg | ORAL_TABLET | Freq: Once | ORAL | Status: AC
  Administered 2023-10-05: 650 mg via ORAL
  Filled 2023-10-05: qty 2

## 2023-10-05 MED ORDER — SODIUM CHLORIDE 0.9 % IV SOLN
510.0000 mg | Freq: Once | INTRAVENOUS | Status: AC
  Administered 2023-10-05: 510 mg via INTRAVENOUS
  Filled 2023-10-05: qty 510

## 2023-10-05 NOTE — Patient Instructions (Signed)
 [facility] [place] - A DEPT OF [facility]  Discharge Instructions: Thank you for choosing [facility] to provide your oncology and hematology care.  If you have a lab appointment with the Cancer Center - please note that after April 8th, 2024, all labs will be drawn in the cancer center.  You do not have to check in or register with the main entrance as you have in the past but will complete your check-in in the cancer center.  Wear comfortable clothing and clothing appropriate for easy access to any Portacath or PICC line.   We strive to give you quality time with your provider. You may need to reschedule your appointment if you arrive late (15 or more minutes).  Arriving late affects you and other patients whose appointments are after yours.  Also, if you miss three or more appointments without notifying the office, you may be dismissed from the clinic at the provider's discretion.      For prescription refill requests, have your pharmacy contact our office and allow 72 hours for refills to be completed.    Today you received the following chemotherapy and/or immunotherapy agents Feraheme. Ferumoxytol Injection What is this medication? FERUMOXYTOL (FER ue MOX i tol) treats low levels of iron in your body (iron deficiency anemia). Iron is a mineral that plays an important role in making red blood cells, which carry oxygen from your lungs to the rest of your body. This medicine may be used for other purposes; ask your health care provider or pharmacist if you have questions. COMMON BRAND NAME(S): Feraheme What should I tell my care team before I take this medication? They need to know if you have any of these conditions: Anemia not caused by low iron levels High levels of iron in the blood Magnetic resonance imaging (MRI) test scheduled An unusual or allergic reaction to iron, other medications, foods, dyes, or preservatives Pregnant or trying to get  pregnant Breastfeeding How should I use this medication? This medication is injected into a vein. It is given by your care team in a hospital or clinic setting. Talk to your care team the use of this medication in children. Special care may be needed. Overdosage: If you think you have taken too much of this medicine contact a poison control center or emergency room at once. NOTE: This medicine is only for you. Do not share this medicine with others. What if I miss a dose? It is important not to miss your dose. Call your care team if you are unable to keep an appointment. What may interact with this medication? Other iron products This list may not describe all possible interactions. Give your health care provider a list of all the medicines, herbs, non-prescription drugs, or dietary supplements you use. Also tell them if you smoke, drink alcohol, or use illegal drugs. Some items may interact with your medicine. What should I watch for while using this medication? Visit your care team for regular checks on your progress. Tell your care team if your symptoms do not start to get better or if they get worse. You may need blood work done while you are taking this medication. You may need to eat more foods that contain iron. Talk to your care team. Foods that contain iron include whole grains or cereals, dried fruits, beans, peas, leafy green vegetables, and organ meats (liver, kidney). What side effects may I notice from receiving this medication? Side effects that  you should report to your care team as soon as possible: Allergic reactions--skin rash, itching, hives, swelling of the face, lips, tongue, or throat Low blood pressure--dizziness, feeling faint or lightheaded, blurry vision Shortness of breath Side effects that usually do not require medical attention (report to your care team if they continue or are bothersome): Flushing Headache Joint pain Muscle pain Nausea Pain, redness, or  irritation at injection site This list may not describe all possible side effects. Call your doctor for medical advice about side effects. You may report side effects to FDA at 1-800-FDA-1088. Where should I keep my medication? This medication is given in a hospital or clinic. It will not be stored at home. NOTE: This sheet is a summary. It may not cover all possible information. If you have questions about this medicine, talk to your doctor, pharmacist, or health care provider.  2024 Elsevier/Gold Standard (2022-08-09 00:00:00)      To help prevent nausea and vomiting after your treatment, we encourage you to take your nausea medication as directed.  BELOW ARE SYMPTOMS THAT SHOULD BE REPORTED IMMEDIATELY: *FEVER GREATER THAN 100.4 F (38 C) OR HIGHER *CHILLS OR SWEATING *NAUSEA AND VOMITING THAT IS NOT CONTROLLED WITH YOUR NAUSEA MEDICATION *UNUSUAL SHORTNESS OF BREATH *UNUSUAL BRUISING OR BLEEDING *URINARY PROBLEMS (pain or burning when urinating, or frequent urination) *BOWEL PROBLEMS (unusual diarrhea, constipation, pain near the anus) TENDERNESS IN MOUTH AND THROAT WITH OR WITHOUT PRESENCE OF ULCERS (sore throat, sores in mouth, or a toothache) UNUSUAL RASH, SWELLING OR PAIN  UNUSUAL VAGINAL DISCHARGE OR ITCHING   Items with * indicate a potential emergency and should be followed up as soon as possible or go to the Emergency Department if any problems should occur.  Please show the CHEMOTHERAPY ALERT CARD or IMMUNOTHERAPY ALERT CARD at check-in to the Emergency Department and triage nurse.  Should you have questions after your visit or need to cancel or reschedule your appointment, please contact [facility] [place] - A DEPT OF [facility] [phone]  and follow the prompts.  Office hours are 8:00 a.m. to 4:30 p.m. Monday - Friday. Please note that voicemails left after 4:00 p.m. may not be returned until the following business day.  We are closed weekends  and major holidays. You have access to a nurse at all times for urgent questions. Please call the main number to the clinic [phone] and follow the prompts.  For any non-urgent questions, you may also contact your provider using MyChart. We now offer e-Visits for anyone 67 and older to request care online for non-urgent symptoms. For details visit [email or web address].   Also download the MyChart app! Go to the app store, search "MyChart", open the app, select [place], and log in with your MyChart username and password.

## 2023-10-05 NOTE — Progress Notes (Signed)
 Patient presents today for 1st iron infusion. (Feraheme). Vital signs stable. Patient given information sheet on possible side effects related to the infusion. Understanding verbalized.   Feraheme given today per MD orders. Tolerated infusion without adverse affects. Vital signs stable. No concerns at this time. Discharged from clinic by wheel chair in stable condition. Alert and oriented x 3. F/U with [facility] as scheduled.

## 2023-10-09 DIAGNOSIS — M47816 Spondylosis without myelopathy or radiculopathy, lumbar region: Secondary | ICD-10-CM | POA: Diagnosis not present

## 2023-10-09 DIAGNOSIS — E1142 Type 2 diabetes mellitus with diabetic polyneuropathy: Secondary | ICD-10-CM | POA: Diagnosis not present

## 2023-10-09 DIAGNOSIS — M48061 Spinal stenosis, lumbar region without neurogenic claudication: Secondary | ICD-10-CM | POA: Diagnosis not present

## 2023-10-09 DIAGNOSIS — G894 Chronic pain syndrome: Secondary | ICD-10-CM | POA: Diagnosis not present

## 2023-10-10 ENCOUNTER — Encounter (HOSPITAL_BASED_OUTPATIENT_CLINIC_OR_DEPARTMENT_OTHER): Attending: Internal Medicine | Admitting: Internal Medicine

## 2023-10-10 DIAGNOSIS — S81801A Unspecified open wound, right lower leg, initial encounter: Secondary | ICD-10-CM | POA: Insufficient documentation

## 2023-10-10 DIAGNOSIS — L89312 Pressure ulcer of right buttock, stage 2: Secondary | ICD-10-CM | POA: Insufficient documentation

## 2023-10-10 DIAGNOSIS — L97821 Non-pressure chronic ulcer of other part of left lower leg limited to breakdown of skin: Secondary | ICD-10-CM | POA: Diagnosis not present

## 2023-10-10 DIAGNOSIS — I739 Peripheral vascular disease, unspecified: Secondary | ICD-10-CM | POA: Insufficient documentation

## 2023-10-10 DIAGNOSIS — X58XXXA Exposure to other specified factors, initial encounter: Secondary | ICD-10-CM | POA: Insufficient documentation

## 2023-10-10 DIAGNOSIS — I890 Lymphedema, not elsewhere classified: Secondary | ICD-10-CM | POA: Insufficient documentation

## 2023-10-10 DIAGNOSIS — S81802A Unspecified open wound, left lower leg, initial encounter: Secondary | ICD-10-CM | POA: Insufficient documentation

## 2023-10-10 DIAGNOSIS — I87312 Chronic venous hypertension (idiopathic) with ulcer of left lower extremity: Secondary | ICD-10-CM | POA: Insufficient documentation

## 2023-10-10 DIAGNOSIS — L89892 Pressure ulcer of other site, stage 2: Secondary | ICD-10-CM | POA: Diagnosis not present

## 2023-10-11 ENCOUNTER — Other Ambulatory Visit: Payer: Self-pay | Admitting: Gastroenterology

## 2023-10-11 DIAGNOSIS — L89312 Pressure ulcer of right buttock, stage 2: Secondary | ICD-10-CM | POA: Diagnosis not present

## 2023-10-11 NOTE — Telephone Encounter (Signed)
 Pt will need an office visit before anymore refills.

## 2023-10-12 ENCOUNTER — Inpatient Hospital Stay

## 2023-10-13 DIAGNOSIS — Z794 Long term (current) use of insulin: Secondary | ICD-10-CM | POA: Diagnosis not present

## 2023-10-13 DIAGNOSIS — G4730 Sleep apnea, unspecified: Secondary | ICD-10-CM | POA: Diagnosis not present

## 2023-10-13 DIAGNOSIS — Z7985 Long-term (current) use of injectable non-insulin antidiabetic drugs: Secondary | ICD-10-CM | POA: Diagnosis not present

## 2023-10-13 DIAGNOSIS — J449 Chronic obstructive pulmonary disease, unspecified: Secondary | ICD-10-CM | POA: Diagnosis not present

## 2023-10-13 DIAGNOSIS — M4800 Spinal stenosis, site unspecified: Secondary | ICD-10-CM | POA: Diagnosis not present

## 2023-10-13 DIAGNOSIS — E11620 Type 2 diabetes mellitus with diabetic dermatitis: Secondary | ICD-10-CM | POA: Diagnosis not present

## 2023-10-13 DIAGNOSIS — I5033 Acute on chronic diastolic (congestive) heart failure: Secondary | ICD-10-CM | POA: Diagnosis not present

## 2023-10-13 DIAGNOSIS — E1143 Type 2 diabetes mellitus with diabetic autonomic (poly)neuropathy: Secondary | ICD-10-CM | POA: Diagnosis not present

## 2023-10-13 DIAGNOSIS — Z7901 Long term (current) use of anticoagulants: Secondary | ICD-10-CM | POA: Diagnosis not present

## 2023-10-13 DIAGNOSIS — M069 Rheumatoid arthritis, unspecified: Secondary | ICD-10-CM | POA: Diagnosis not present

## 2023-10-13 DIAGNOSIS — D649 Anemia, unspecified: Secondary | ICD-10-CM | POA: Diagnosis not present

## 2023-10-13 DIAGNOSIS — E1142 Type 2 diabetes mellitus with diabetic polyneuropathy: Secondary | ICD-10-CM | POA: Diagnosis not present

## 2023-10-13 DIAGNOSIS — I110 Hypertensive heart disease with heart failure: Secondary | ICD-10-CM | POA: Diagnosis not present

## 2023-10-13 DIAGNOSIS — N8110 Cystocele, unspecified: Secondary | ICD-10-CM | POA: Diagnosis not present

## 2023-10-13 DIAGNOSIS — I4821 Permanent atrial fibrillation: Secondary | ICD-10-CM | POA: Diagnosis not present

## 2023-10-13 DIAGNOSIS — M797 Fibromyalgia: Secondary | ICD-10-CM | POA: Diagnosis not present

## 2023-10-13 DIAGNOSIS — Z9981 Dependence on supplemental oxygen: Secondary | ICD-10-CM | POA: Diagnosis not present

## 2023-10-13 DIAGNOSIS — Z7984 Long term (current) use of oral hypoglycemic drugs: Secondary | ICD-10-CM | POA: Diagnosis not present

## 2023-10-13 DIAGNOSIS — L97821 Non-pressure chronic ulcer of other part of left lower leg limited to breakdown of skin: Secondary | ICD-10-CM | POA: Diagnosis not present

## 2023-10-13 DIAGNOSIS — K3184 Gastroparesis: Secondary | ICD-10-CM | POA: Diagnosis not present

## 2023-10-13 DIAGNOSIS — I890 Lymphedema, not elsewhere classified: Secondary | ICD-10-CM | POA: Diagnosis not present

## 2023-10-15 ENCOUNTER — Inpatient Hospital Stay

## 2023-10-15 VITALS — BP 107/47 | HR 61 | Temp 96.9°F | Resp 18

## 2023-10-15 DIAGNOSIS — D508 Other iron deficiency anemias: Secondary | ICD-10-CM | POA: Diagnosis not present

## 2023-10-15 DIAGNOSIS — D509 Iron deficiency anemia, unspecified: Secondary | ICD-10-CM

## 2023-10-15 MED ORDER — SODIUM CHLORIDE 0.9 % IV SOLN
510.0000 mg | Freq: Once | INTRAVENOUS | Status: AC
  Administered 2023-10-15: 510 mg via INTRAVENOUS
  Filled 2023-10-15: qty 17

## 2023-10-15 MED ORDER — SODIUM CHLORIDE 0.9 % IV SOLN: INTRAVENOUS | Status: DC

## 2023-10-15 MED ORDER — ACETAMINOPHEN 325 MG PO TABS
650.0000 mg | ORAL_TABLET | Freq: Once | ORAL | Status: AC
  Administered 2023-10-15: 650 mg via ORAL
  Filled 2023-10-15: qty 2

## 2023-10-15 NOTE — Patient Instructions (Signed)

## 2023-10-15 NOTE — Progress Notes (Signed)
 Patient presents today for iron infusion.  Patient is in satisfactory condition with no new complaints voiced.  Vital signs are stable.  We will proceed with infusion per provider orders.    Patient took Loratadine  at home prior to arrival. Peripheral IV started with good blood return pre and post infusion.  Feraheme 510 mg IV iron given today per MD orders. Tolerated infusion without adverse affects. Vital signs stable. No complaints at this time. Discharged from clinic via wheelchair in stable condition. Alert and oriented x 3. F/U with [facility] as scheduled.

## 2023-10-16 DIAGNOSIS — K3184 Gastroparesis: Secondary | ICD-10-CM | POA: Diagnosis not present

## 2023-10-16 DIAGNOSIS — G4730 Sleep apnea, unspecified: Secondary | ICD-10-CM | POA: Diagnosis not present

## 2023-10-16 DIAGNOSIS — I5033 Acute on chronic diastolic (congestive) heart failure: Secondary | ICD-10-CM | POA: Diagnosis not present

## 2023-10-16 DIAGNOSIS — I4821 Permanent atrial fibrillation: Secondary | ICD-10-CM | POA: Diagnosis not present

## 2023-10-16 DIAGNOSIS — M797 Fibromyalgia: Secondary | ICD-10-CM | POA: Diagnosis not present

## 2023-10-16 DIAGNOSIS — N8110 Cystocele, unspecified: Secondary | ICD-10-CM | POA: Diagnosis not present

## 2023-10-16 DIAGNOSIS — Z9981 Dependence on supplemental oxygen: Secondary | ICD-10-CM | POA: Diagnosis not present

## 2023-10-16 DIAGNOSIS — E1142 Type 2 diabetes mellitus with diabetic polyneuropathy: Secondary | ICD-10-CM | POA: Diagnosis not present

## 2023-10-16 DIAGNOSIS — J449 Chronic obstructive pulmonary disease, unspecified: Secondary | ICD-10-CM | POA: Diagnosis not present

## 2023-10-16 DIAGNOSIS — Z7984 Long term (current) use of oral hypoglycemic drugs: Secondary | ICD-10-CM | POA: Diagnosis not present

## 2023-10-16 DIAGNOSIS — E1143 Type 2 diabetes mellitus with diabetic autonomic (poly)neuropathy: Secondary | ICD-10-CM | POA: Diagnosis not present

## 2023-10-16 DIAGNOSIS — I890 Lymphedema, not elsewhere classified: Secondary | ICD-10-CM | POA: Diagnosis not present

## 2023-10-16 DIAGNOSIS — M069 Rheumatoid arthritis, unspecified: Secondary | ICD-10-CM | POA: Diagnosis not present

## 2023-10-16 DIAGNOSIS — I110 Hypertensive heart disease with heart failure: Secondary | ICD-10-CM | POA: Diagnosis not present

## 2023-10-16 DIAGNOSIS — Z7901 Long term (current) use of anticoagulants: Secondary | ICD-10-CM | POA: Diagnosis not present

## 2023-10-16 DIAGNOSIS — Z7985 Long-term (current) use of injectable non-insulin antidiabetic drugs: Secondary | ICD-10-CM | POA: Diagnosis not present

## 2023-10-16 DIAGNOSIS — M4800 Spinal stenosis, site unspecified: Secondary | ICD-10-CM | POA: Diagnosis not present

## 2023-10-16 DIAGNOSIS — E11620 Type 2 diabetes mellitus with diabetic dermatitis: Secondary | ICD-10-CM | POA: Diagnosis not present

## 2023-10-16 DIAGNOSIS — D649 Anemia, unspecified: Secondary | ICD-10-CM | POA: Diagnosis not present

## 2023-10-16 DIAGNOSIS — L97821 Non-pressure chronic ulcer of other part of left lower leg limited to breakdown of skin: Secondary | ICD-10-CM | POA: Diagnosis not present

## 2023-10-16 DIAGNOSIS — Z794 Long term (current) use of insulin: Secondary | ICD-10-CM | POA: Diagnosis not present

## 2023-10-22 ENCOUNTER — Encounter: Payer: Self-pay | Admitting: Gastroenterology

## 2023-10-23 DIAGNOSIS — E1142 Type 2 diabetes mellitus with diabetic polyneuropathy: Secondary | ICD-10-CM | POA: Diagnosis not present

## 2023-10-23 DIAGNOSIS — M79674 Pain in right toe(s): Secondary | ICD-10-CM | POA: Diagnosis not present

## 2023-10-23 DIAGNOSIS — B351 Tinea unguium: Secondary | ICD-10-CM | POA: Diagnosis not present

## 2023-10-23 DIAGNOSIS — L84 Corns and callosities: Secondary | ICD-10-CM | POA: Diagnosis not present

## 2023-10-23 DIAGNOSIS — M79675 Pain in left toe(s): Secondary | ICD-10-CM | POA: Diagnosis not present

## 2023-10-24 ENCOUNTER — Encounter (HOSPITAL_BASED_OUTPATIENT_CLINIC_OR_DEPARTMENT_OTHER): Admitting: Internal Medicine

## 2023-10-24 DIAGNOSIS — G4733 Obstructive sleep apnea (adult) (pediatric): Secondary | ICD-10-CM | POA: Diagnosis not present

## 2023-10-24 DIAGNOSIS — L89312 Pressure ulcer of right buttock, stage 2: Secondary | ICD-10-CM

## 2023-10-24 DIAGNOSIS — S81802A Unspecified open wound, left lower leg, initial encounter: Secondary | ICD-10-CM | POA: Diagnosis not present

## 2023-10-24 DIAGNOSIS — I739 Peripheral vascular disease, unspecified: Secondary | ICD-10-CM | POA: Diagnosis not present

## 2023-10-24 DIAGNOSIS — L97821 Non-pressure chronic ulcer of other part of left lower leg limited to breakdown of skin: Secondary | ICD-10-CM | POA: Diagnosis not present

## 2023-10-24 DIAGNOSIS — M1710 Unilateral primary osteoarthritis, unspecified knee: Secondary | ICD-10-CM | POA: Diagnosis not present

## 2023-10-24 DIAGNOSIS — I890 Lymphedema, not elsewhere classified: Secondary | ICD-10-CM | POA: Diagnosis not present

## 2023-10-24 DIAGNOSIS — I87312 Chronic venous hypertension (idiopathic) with ulcer of left lower extremity: Secondary | ICD-10-CM

## 2023-10-24 DIAGNOSIS — L89892 Pressure ulcer of other site, stage 2: Secondary | ICD-10-CM | POA: Diagnosis not present

## 2023-10-24 DIAGNOSIS — J449 Chronic obstructive pulmonary disease, unspecified: Secondary | ICD-10-CM | POA: Diagnosis not present

## 2023-10-24 DIAGNOSIS — E1142 Type 2 diabetes mellitus with diabetic polyneuropathy: Secondary | ICD-10-CM | POA: Diagnosis not present

## 2023-10-24 DIAGNOSIS — X58XXXA Exposure to other specified factors, initial encounter: Secondary | ICD-10-CM | POA: Diagnosis not present

## 2023-10-24 DIAGNOSIS — S81801A Unspecified open wound, right lower leg, initial encounter: Secondary | ICD-10-CM | POA: Diagnosis not present

## 2023-10-31 DIAGNOSIS — J449 Chronic obstructive pulmonary disease, unspecified: Secondary | ICD-10-CM | POA: Diagnosis not present

## 2023-10-31 DIAGNOSIS — G4733 Obstructive sleep apnea (adult) (pediatric): Secondary | ICD-10-CM | POA: Diagnosis not present

## 2023-11-05 ENCOUNTER — Other Ambulatory Visit: Payer: Self-pay | Admitting: Pulmonary Disease

## 2023-11-21 ENCOUNTER — Other Ambulatory Visit (HOSPITAL_BASED_OUTPATIENT_CLINIC_OR_DEPARTMENT_OTHER): Payer: Self-pay

## 2023-11-21 ENCOUNTER — Encounter (HOSPITAL_BASED_OUTPATIENT_CLINIC_OR_DEPARTMENT_OTHER): Attending: Internal Medicine | Admitting: Internal Medicine

## 2023-11-21 ENCOUNTER — Encounter: Payer: Self-pay | Admitting: Oncology

## 2023-11-21 DIAGNOSIS — L89892 Pressure ulcer of other site, stage 2: Secondary | ICD-10-CM | POA: Diagnosis not present

## 2023-11-21 DIAGNOSIS — L97821 Non-pressure chronic ulcer of other part of left lower leg limited to breakdown of skin: Secondary | ICD-10-CM | POA: Diagnosis not present

## 2023-11-21 DIAGNOSIS — I87312 Chronic venous hypertension (idiopathic) with ulcer of left lower extremity: Secondary | ICD-10-CM | POA: Diagnosis present

## 2023-11-21 DIAGNOSIS — L89312 Pressure ulcer of right buttock, stage 2: Secondary | ICD-10-CM | POA: Diagnosis not present

## 2023-11-21 MED ORDER — MUPIROCIN 2 % EX OINT
TOPICAL_OINTMENT | CUTANEOUS | 2 refills | Status: AC
  Filled 2023-11-21: qty 22, 30d supply, fill #0

## 2023-11-26 ENCOUNTER — Encounter: Payer: Self-pay | Admitting: Oncology

## 2023-11-26 NOTE — Assessment & Plan Note (Signed)
 Mild CKD with creatinine 1.3-1 in this lab drawn. Continue to monitor and likely contributing some to his anemia. Repeat labs in 10 to 12 weeks.

## 2023-12-03 ENCOUNTER — Other Ambulatory Visit (HOSPITAL_BASED_OUTPATIENT_CLINIC_OR_DEPARTMENT_OTHER): Payer: Self-pay

## 2023-12-04 ENCOUNTER — Other Ambulatory Visit: Payer: Self-pay | Admitting: Gastroenterology

## 2023-12-06 ENCOUNTER — Encounter (HOSPITAL_BASED_OUTPATIENT_CLINIC_OR_DEPARTMENT_OTHER): Admitting: General Surgery

## 2023-12-13 ENCOUNTER — Encounter (HOSPITAL_BASED_OUTPATIENT_CLINIC_OR_DEPARTMENT_OTHER): Admitting: Internal Medicine

## 2023-12-14 ENCOUNTER — Telehealth: Payer: Self-pay

## 2023-12-14 ENCOUNTER — Other Ambulatory Visit: Payer: Self-pay | Admitting: Gastroenterology

## 2023-12-14 NOTE — Telephone Encounter (Signed)
 FYI:  Returned the pt's call and advised the pt that in October when we sent in a refill for her she was advised then to schedule a appt because it was coming up that she had not been seen . Pt advised me that she has been doing this for years but I explained the policy to her regarding Rx's. So pt did get a refill then. I advised this pt that she can call her PCP to get a refill until then ([facility] ). Pt stated that if it becomes a problem for her she will call them for a refill. Pt advised once again of the need to come at least once a year.

## 2023-12-17 MED ORDER — PRUCALOPRIDE SUCCINATE 2 MG PO TABS: 1.0000 | ORAL_TABLET | Freq: Every day | ORAL | 3 refills | Status: AC

## 2023-12-17 NOTE — Telephone Encounter (Signed)
 I filled [facility]  for her and she has an appt Feb 2026. I believe I am scheduling that far out right now.

## 2023-12-17 NOTE — Addendum Note (Signed)
 Addended by: SHIRLEAN THERISA ORN on: 12/17/2023 04:21 PM   Modules accepted: Orders

## 2023-12-17 NOTE — Telephone Encounter (Signed)
 Noted pt. advised

## 2023-12-20 ENCOUNTER — Ambulatory Visit: Admitting: Neurology

## 2023-12-20 VITALS — BP 115/62 | HR 55 | Ht 68.0 in | Wt 325.0 lb

## 2023-12-20 DIAGNOSIS — G253 Myoclonus: Secondary | ICD-10-CM | POA: Insufficient documentation

## 2023-12-20 NOTE — Progress Notes (Signed)
 Chief Complaint  Patient presents with   New Patient (Initial Visit)    Pt in EMG room 3. Melissa CNA in room. proficient paper referral for Myoclonus.      ASSESSMENT AND PLAN  [name] [name] is a 63 y.o. female   Intermittent body jerking movement   Most consistent with myoclonic movement that is related to her polypharmacy, chronic kidney disease  Much improved with adjustment of her medications,  Laboratory evaluation to rule out metabolic etiology  Continue follow-up with primary care, and nephrologist  DIAGNOSTIC DATA (LABS, IMAGING, TESTING) - I reviewed patient records, labs, notes, testing and imaging myself where available.   MEDICAL HISTORY:  [name] [name], is a 63 year old female seen in request by her primary care doctor Rosamond Mon B, for evaluation of body jerking movement, she is accompanied by her CNA Melissa at today's visit December 20, 2023  History is obtained from the patient and review of electronic medical records. I personally reviewed pertinent available imaging films in PACS.   PMHx of  DM Obesity Chronic pain HTN COPD, oxygen  dependent A fib, on xarelto  Psoriatic Arthritis Cervical decompression in 2016, presenting with left cervical radiculopathy Lumbar decompression Chronic spine pain, knee, shoulder pain  She lives at home, Eleanor has been taking care of her for 20 years, 5 days x 8 hours  She has significant mobility issue, had a history of lumbar cervical decompression surgery, multiple joints pain including knee and shoulder, under chronic pain management, polypharmacy treatment, previous smoker, asthma, COPD, simply become oxygen  dependent  Over the past few months, she began to have intermittent sudden body jerking movement, sometimes more frequent and last longer larger amplitude, on December 09, 2023, woke up in the morning she had such violent body jerking movement, not holding or posturing, was seen by pain management and the  primary care physician next couple days, there are some medication adjustment, Percocet 7.5/325 mg dosage was decreased from 4 times a day to 3 times a day, gabapentin  from 800 mg 4 times a day to 3 times a day  Since then, lower body jerking movement has much improved, but remain, intermittent, she is also on baclofen  60 mg a day, diuretic, with mildly worsening kidney function, creatinine 1.32 in September with elevated BUN 33, GFR of 46, she is going to have a nephrology evaluation so  CT head with on Dec 15th 2025, No CT evidence of acute infarction, hemorrhage, edema, mass or mass effect. Ventricles and basilar cisterns are unremarkable.   US  Abdomen on Dec 15th 2025, 1.    Enlarged and heterogeneous liver.  2.    Mild intrahepatic and extrahepatic biliary ductal dilation, likely due to reservoir effect.  3.    Prior cholecystectomy.   PHYSICAL EXAM:   Vitals:   12/20/23 1326  BP: 115/62  Pulse: (!) 55  SpO2: 97%  Weight: (!) 325 lb (147.4 kg)  Height: 5' 8 (1.727 m)   Not recorded     Body mass index is 49.42 kg/m.  PHYSICAL EXAMNIATION:  Gen: NAD, conversant, well nourised, well groomed                     Cardiovascular: Regular rate rhythm, no peripheral edema, warm, nontender. Eyes: Conjunctivae clear without exudates or hemorrhage Neck: Supple, no carotid bruits. Pulmonary: Clear to auscultation bilaterally   NEUROLOGICAL EXAM:  MENTAL STATUS: Speech/cognition: Obesity, sitting in wheelchair, awake, alert, oriented to history taking and casual conversation CRANIAL NERVES:  CN II: Visual fields are full to confrontation. Pupils are round equal and briskly reactive to light. CN III, IV, VI: extraocular movement are normal. No ptosis. CN V: Facial sensation is intact to light touch CN VII: Face is symmetric with normal eye closure  CN VIII: Hearing is normal to causal conversation. CN IX, X: Phonation is normal. CN XI: Head turning and shoulder shrug are  intact  MOTOR: There was no significant bilateral upper extremity action tremor, no rigidity, bradykinesia, limited in upper extremity movement due to shoulder pain, able to raise bilateral lower extremity proximal and distal muscles against gravity, wearing special modified boots, bilateral lower extremity in wrap,  REFLEXES: Hypoactive symmetric  SENSORY: Length dependent vibratory sensation and light touch to below knee level  COORDINATION: There is no trunk or limb dysmetria noted.  GAIT/STANCE: Deferred  REVIEW OF SYSTEMS:  Full 14 system review of systems performed and notable only for as above All other review of systems were negative.   ALLERGIES: Allergies[1]  HOME MEDICATIONS: Current Outpatient Medications  Medication Sig Dispense Refill   albuterol  (VENTOLIN  HFA) 108 (90 Base) MCG/ACT inhaler Inhale 2 puffs into the lungs.     atorvastatin  (LIPITOR) 40 MG tablet Take 40 mg by mouth daily.     baclofen  (LIORESAL ) 20 MG tablet Take 20 mg by mouth 3 (three) times daily.     dexlansoprazole  (DEXILANT ) 60 MG capsule TAKE ONE CAPSULE BY MOUTH ONCE A DAY BEFORE BREAKFAST 90 capsule 0   diclofenac sodium (VOLTAREN) 1 % GEL Apply 2-4 g topically 4 (four) times daily as needed (for pain).      diltiazem  (CARDIZEM  CD) 120 MG 24 hr capsule TAKE ONE CAPSULE BY MOUTH EVERY DAY 90 capsule 3   docusate sodium  (COLACE) 100 MG capsule Take 100 mg by mouth 2 (two) times daily.     ferrous sulfate 325 (65 FE) MG EC tablet Take 325 mg by mouth daily with breakfast.     flecainide  (TAMBOCOR ) 50 MG tablet TAKE 2 TABLETS BY MOUTH EVERY MORNING and TAKE 2 TABLETS BY MOUTH EVERY EVENING 360 tablet 3   fluticasone  (FLONASE ) 50 MCG/ACT nasal spray INSTILL 2 SPRAYS IN EACH NOSTRIL EVERY DAY 16 g 6   gabapentin  (NEURONTIN ) 600 MG tablet Take 600 mg by mouth 3 (three) times daily.     glimepiride (AMARYL) 4 MG tablet Take 4 mg by mouth 2 (two) times daily.     hydrocortisone  (ANUSOL -HC) 2.5 % rectal  cream Place 1 Application rectally 2 (two) times daily. For rectal bleeding 30 g 1   isosorbide  mononitrate (IMDUR ) 120 MG 24 hr tablet Take 120 mg by mouth daily. In the morning     isosorbide  mononitrate (IMDUR ) 30 MG 24 hr tablet Take 30 mg by mouth every evening.     linaclotide  (LINZESS ) 290 MCG CAPS capsule Take 1 capsule (290 mcg total) by mouth daily before breakfast. 90 capsule 3   lisinopril  (PRINIVIL ,ZESTRIL ) 10 MG tablet Take 10 mg by mouth every evening.      loratadine  (CLARITIN ) 10 MG tablet Take 1 tablet (10 mg total) by mouth daily. 30 tablet 11   meclizine  (ANTIVERT ) 25 MG tablet Take 25 mg by mouth 3 (three) times daily.     metFORMIN  (GLUCOPHAGE ) 500 MG tablet Take 500-1,000 mg by mouth 2 (two) times daily. Take 1,000mg  in the Morning and 500 mg in the evening     metolazone  (ZAROXOLYN ) 2.5 MG tablet Take 1 tablet (2.5 mg total) by mouth  daily. 30 Minutes before taking your torsemide . 90 tablet 1   Multiple Vitamin (MULTIVITAMIN WITH MINERALS) TABS tablet Take 1 tablet by mouth daily.     mupirocin  ointment (BACTROBAN ) 2 % Apply 1 Application topically in the morning and at bedtime.     mupirocin  ointment (BACTROBAN ) 2 % Apply once daily to the wound beds 30 g 2   nitroGLYCERIN  (NITROLINGUAL ) 0.4 MG/SPRAY spray USE 1 SPRAY UNDER THE TONGUE EVERY 5 MINUTES UP TO 3 DOSES AS NEEDED FOR CHEST PAIN. GO TO ER IF NOT RESOLVED 12 g 3   NUCYNTA  ER 100 MG 12 hr tablet Take 100 mg by mouth 2 (two) times daily.     nystatin  (MYCOSTATIN /NYSTOP ) 100000 UNIT/GM POWD Apply 1 g topically daily.     ondansetron  (ZOFRAN -ODT) 8 MG disintegrating tablet Take 8 mg by mouth daily.     OTEZLA  30 MG TABS Take 1 tablet by mouth 2 (two) times daily.     oxyCODONE -acetaminophen  (PERCOCET) 7.5-325 MG tablet Take 1 tablet by mouth every 6 (six) hours as needed.     OXYGEN  Inhale 2 L into the lungs as needed (shortness of breath).     OZEMPIC, 1 MG/DOSE, 4 MG/3ML SOPN Inject 1 mg into the skin every Tuesday.      Prucalopride Succinate  (MOTEGRITY ) 2 MG TABS Take 1 tablet (2 mg total) by mouth daily. 90 tablet 3   rivaroxaban  (XARELTO ) 20 MG TABS tablet Take 20 mg by mouth daily with supper.      sodium chloride  (OCEAN) 0.65 % SOLN nasal spray Place 1 spray into both nostrils daily.     spironolactone  (ALDACTONE ) 50 MG tablet Take 50 mg by mouth 3 (three) times daily.     torsemide  40 MG TABS Take 40 mg by mouth 2 (two) times daily. 180 tablet 1   TOUJEO  SOLOSTAR 300 UNIT/ML Solostar Pen Inject 43 Units into the skin at bedtime.     umeclidinium-vilanterol (ANORO ELLIPTA ) 62.5-25 MCG/ACT AEPB Inhale 1 puff into the lungs daily. 60 each 11   VASCEPA  1 g CAPS Take 1 g by mouth 4 (four) times daily.     No current facility-administered medications for this visit.    PAST MEDICAL HISTORY: Past Medical History:  Diagnosis Date   Anemia    Bladder dysfunction    Self urinary catheterization   Chronic pain syndrome    long term and current opiates   Complication of anesthesia    COPD (chronic obstructive pulmonary disease) (HCC)    DDD (degenerative disc disease)    Depression    Dysrhythmia    Bradycardia, A.fib   Esophageal spasm    NTG and Norvasc   Essential hypertension    Fatty liver    2025   Fibromyalgia    Gastroparesis    GERD (gastroesophageal reflux disease)    History of cardiac catheterization    Normal coronary arteries   History of pituitary tumor    Irritable bowel syndrome    Lymphedema    Osteoarthritis    PAF (paroxysmal atrial fibrillation) (HCC)    Pneumonia    PONV (postoperative nausea and vomiting)    Psoriatic arthritis (HCC)    Rheumatoid arthritis (HCC)    Sleep apnea    BIPAP   Type 2 diabetes mellitus (HCC)    Urinary tract infection     PAST SURGICAL HISTORY: Past Surgical History:  Procedure Laterality Date   ABDOMINAL HYSTERECTOMY  1997   ANTERIOR CERVICAL DECOMP/DISCECTOMY FUSION N/A 12/02/2014  Procedure: Cervical five cervical six  anterior cervical decompression with fusion interbody prosthesis plating and bone graft;  Surgeon: Reyes Budge, MD;  Location: [facility];  Service: Neurosurgery;  Laterality: N/A;  C56 anterior cervical decompression with fusion interbody prosthesis plating and bonegraft   APPENDECTOMY  09/1997   BACK SURGERY     lumbar    1991 and 2009   CARDIAC CATHETERIZATION     2020   CARDIOVERSION N/A 03/26/2014   Procedure: CARDIOVERSION;  Surgeon: Pearla DELENA Rout, MD;  Location: [facility];  Service: Endoscopy;  Laterality: N/A;   CARDIOVERSION N/A 05/04/2014   Procedure: CARDIOVERSION;  Surgeon: Jayson KANDICE Sierras, MD;  Location: [facility];  Service: Cardiovascular;  Laterality: N/A;   CHOLECYSTECTOMY  1996   laparoscopic   COLONOSCOPY     Approximately 2003. Per medical records, internal hemorrhoids noted   COLONOSCOPY N/A 06/25/2013   Dr. Shaaron: Anal canal hemorrhoids-more likely the source of paper hematochezia. Redundant, capacious colon. Multiple colonic polyps-tubular adenoma   COLONOSCOPY WITH PROPOFOL  N/A 12/04/2016   Dr. Shaaron: redundant colon, colonic diverticulosis. Screening due in 2023   COLONOSCOPY WITH PROPOFOL  N/A 08/11/2021   Procedure: COLONOSCOPY WITH PROPOFOL ;  Surgeon: Shaaron Lamar HERO, MD;  Location: [facility];  Service: Endoscopy;  Laterality: N/A;  9:15am   ENDOVENOUS ABLATION SAPHENOUS VEIN W/ LASER Left 03/07/2018   endovenous ablation left greater saphenous vein by Lonni Blade MD    ESOPHAGOGASTRODUODENOSCOPY  10/12/2004   MFM:Qpwz plaquing on the esophageal mucosa of uncertain significance, not typical of what is seen with candida esophagitis status post KOH brushing for KOH prep and biopsy for histology. Rule out candida esophagitis/eosinophilic esophagitis. Otherwise normal esophagus. Tiny hiatal hernia. Otherwise, normal stomach, normal D1 and D2. Benign biopsy of esophagus, unknown KOH status.    ESOPHAGOGASTRODUODENOSCOPY N/A 06/25/2013   Dr. Rourk:mild  chronic gastritis   HAND SURGERY Left 2012   ganglion excision and nerve repair   KNEE SURGERY Left 1997   arthroscopy and debridement   LEFT HEART CATHETERIZATION WITH CORONARY ANGIOGRAM N/A 11/15/2010   Procedure: LEFT HEART CATHETERIZATION WITH CORONARY ANGIOGRAM;  Surgeon: Erick JONELLE Bergamo, MD;  Location[facility] CATH LAB;  Service: Cardiovascular;  Laterality: N/A;   LUNG BIOPSY  2007   benign per patient   POLYPECTOMY  08/11/2021   Procedure: POLYPECTOMY INTESTINAL;  Surgeon: Shaaron Lamar HERO, MD;  Location: [facility];  Service: Endoscopy;;   RIGHT/LEFT HEART CATH AND CORONARY ANGIOGRAPHY N/A 08/28/2018   Procedure: RIGHT/LEFT HEART CATH AND CORONARY ANGIOGRAPHY;  Surgeon: Verlin Lonni BIRCH, MD;  Location: [facility] CV [facility];  Service: Cardiovascular;  Laterality: N/A;   SHOULDER ARTHROSCOPY W/ ROTATOR CUFF REPAIR Left 02/16/2016    FAMILY HISTORY: Family History  Problem Relation Age of Onset   Coronary artery disease Father        Died age 68   Heart attack Father    Arrhythmia Father        AF   Diabetes Father    Parkinson's disease Father    Arrhythmia Mother        AF   Stroke Mother    Dementia Mother    Cancer Mother        UTERINE    Parkinson's disease Mother    Arrhythmia Brother        AF   Colon cancer Maternal Grandfather    Colon cancer Paternal Aunt    Colon cancer Paternal Aunt    Diabetes Son  Cancer Sister        BREAST    Depression Sister    Breast cancer Sister    Depression Sister    Depression Sister    Breast cancer Maternal Aunt     SOCIAL HISTORY: Social History   Socioeconomic History   Marital status: Divorced    Spouse name: Not on file   Number of children: 1   Years of education: Not on file   Highest education level: Not on file  Occupational History   Not on file  Tobacco Use   Smoking status: Former    Current packs/day: 0.00    Average packs/day: 1.5 packs/day for 30.0 years (45.0 ttl pk-yrs)    Types:  Cigarettes    Start date: 03/06/1977    Quit date: 01/03/2007    Years since quitting: 16.9   Smokeless tobacco: Never  Vaping Use   Vaping status: Never Used  Substance and Sexual Activity   Alcohol  use: No    Alcohol /week: 0.0 standard drinks of alcohol    Drug use: No   Sexual activity: Not Currently  Other Topics Concern   Not on file  Social History Narrative   Not on file   Social Drivers of Health   Tobacco Use: Medium Risk (10/05/2023)   Patient History    Smoking Tobacco Use: Former    Smokeless Tobacco Use: Never    Passive Exposure: Not on Actuary Strain: Low Risk (09/22/2022)   Overall Financial Resource Strain (CARDIA)    Difficulty of Paying Living Expenses: Not very hard  Food Insecurity: No Food Insecurity (05/03/2023)   Hunger Vital Sign    Worried About Running Out of Food in the Last Year: Never true    Ran Out of Food in the Last Year: Never true  Transportation Needs: No Transportation Needs (05/03/2023)   PRAPARE - Administrator, Civil Service (Medical): No    Lack of Transportation (Non-Medical): No  Physical Activity: Inactive (08/18/2022)   Exercise Vital Sign    Days of Exercise per Week: 0 days    Minutes of Exercise per Session: 0 min  Stress: Not on file  Social Connections: Patient Declined (05/03/2023)   Social Connection and Isolation Panel    Frequency of Communication with Friends and Family: Patient declined    Frequency of Social Gatherings with Friends and Family: Patient declined    Attends Religious Services: Patient declined    Database Administrator or Organizations: Patient declined    Attends Banker Meetings: Patient declined    Marital Status: Patient declined  Intimate Partner Violence: Not At Risk (05/03/2023)   Humiliation, Afraid, Rape, and Kick questionnaire    Fear of Current or Ex-Partner: No    Emotionally Abused: No    Physically Abused: No    Sexually Abused: No  Depression (PHQ2-9):  Low Risk (10/15/2023)   Depression (PHQ2-9)    PHQ-2 Score: 0  Alcohol  Screen: Not on file  Housing: Low Risk (05/03/2023)   Housing Stability Vital Sign    Unable to Pay for Housing in the Last Year: No    Number of Times Moved in the Last Year: 0    Homeless in the Last Year: No  Utilities: Not At Risk (05/03/2023)   AHC Utilities    Threatened with loss of utilities: No  Health Literacy: Adequate Health Literacy (08/18/2022)   B1300 Health Literacy    Frequency of need for help with medical  instructions: Never      Modena Callander, M.D. Ph.D.  [facility] [address], [address] [place], [place] [address] Ph: [phone] Fax: [phone]  CC:  Rosamond Leta NOVAK, MD [address] [place],  [place] [address]  Rosamond Leta NOVAK, MD       [1]  Allergies Allergen Reactions   Tape Other (See Comments)    Welts, bad place on skin. Paper tape is okay   Wound Dressing Adhesive Hives    Welts, bad place on skin. Paper tape is okay   Hyoscyamine Sulfate Palpitations   Metoclopramide Other (See Comments)    Tremors

## 2023-12-21 ENCOUNTER — Ambulatory Visit: Payer: Self-pay | Admitting: Neurology

## 2023-12-21 LAB — CBC WITH DIFFERENTIAL/PLATELET
Basophils Absolute: 0.1 x10E3/uL (ref 0.0–0.2)
Basos: 1 %
EOS (ABSOLUTE): 0.4 x10E3/uL (ref 0.0–0.4)
Eos: 3 %
Hematocrit: 29.8 % — ABNORMAL LOW (ref 34.0–46.6)
Hemoglobin: 9.6 g/dL — ABNORMAL LOW (ref 11.1–15.9)
Immature Grans (Abs): 0 x10E3/uL (ref 0.0–0.1)
Immature Granulocytes: 0 %
Lymphocytes Absolute: 2.8 x10E3/uL (ref 0.7–3.1)
Lymphs: 23 %
MCH: 29.5 pg (ref 26.6–33.0)
MCHC: 32.2 g/dL (ref 31.5–35.7)
MCV: 92 fL (ref 79–97)
Monocytes Absolute: 0.8 x10E3/uL (ref 0.1–0.9)
Monocytes: 7 %
Neutrophils Absolute: 8 x10E3/uL — ABNORMAL HIGH (ref 1.4–7.0)
Neutrophils: 66 %
Platelets: 314 x10E3/uL (ref 150–450)
RBC: 3.25 x10E6/uL — ABNORMAL LOW (ref 3.77–5.28)
RDW: 13.1 % (ref 11.7–15.4)
WBC: 12.1 x10E3/uL — ABNORMAL HIGH (ref 3.4–10.8)

## 2023-12-21 LAB — COMPREHENSIVE METABOLIC PANEL WITH GFR
ALT: 9 IU/L (ref 0–32)
AST: 16 IU/L (ref 0–40)
Albumin: 4.4 g/dL (ref 3.9–4.9)
Alkaline Phosphatase: 79 IU/L (ref 49–135)
BUN/Creatinine Ratio: 29 — ABNORMAL HIGH (ref 12–28)
BUN: 56 mg/dL — ABNORMAL HIGH (ref 8–27)
Bilirubin Total: 0.2 mg/dL (ref 0.0–1.2)
CO2: 26 mmol/L (ref 20–29)
Calcium: 10.2 mg/dL (ref 8.7–10.3)
Chloride: 96 mmol/L (ref 96–106)
Creatinine, Ser: 1.92 mg/dL — ABNORMAL HIGH (ref 0.57–1.00)
Globulin, Total: 2.7 g/dL (ref 1.5–4.5)
Glucose: 67 mg/dL — ABNORMAL LOW (ref 70–99)
Potassium: 5.1 mmol/L (ref 3.5–5.2)
Sodium: 136 mmol/L (ref 134–144)
Total Protein: 7.1 g/dL (ref 6.0–8.5)
eGFR: 29 mL/min/1.73 — ABNORMAL LOW

## 2023-12-21 LAB — TSH: TSH: 2.04 u[IU]/mL (ref 0.450–4.500)

## 2023-12-21 LAB — VITAMIN B12: Vitamin B-12: 1613 pg/mL — ABNORMAL HIGH (ref 232–1245)

## 2023-12-25 ENCOUNTER — Inpatient Hospital Stay: Attending: Oncology

## 2023-12-25 ENCOUNTER — Encounter (HOSPITAL_BASED_OUTPATIENT_CLINIC_OR_DEPARTMENT_OTHER): Attending: General Surgery | Admitting: General Surgery

## 2023-12-25 DIAGNOSIS — I87312 Chronic venous hypertension (idiopathic) with ulcer of left lower extremity: Secondary | ICD-10-CM | POA: Insufficient documentation

## 2023-12-25 DIAGNOSIS — L89892 Pressure ulcer of other site, stage 2: Secondary | ICD-10-CM | POA: Insufficient documentation

## 2023-12-25 DIAGNOSIS — L97821 Non-pressure chronic ulcer of other part of left lower leg limited to breakdown of skin: Secondary | ICD-10-CM | POA: Diagnosis not present

## 2023-12-25 DIAGNOSIS — I890 Lymphedema, not elsewhere classified: Secondary | ICD-10-CM | POA: Insufficient documentation

## 2023-12-25 DIAGNOSIS — I739 Peripheral vascular disease, unspecified: Secondary | ICD-10-CM | POA: Diagnosis not present

## 2023-12-25 DIAGNOSIS — L89312 Pressure ulcer of right buttock, stage 2: Secondary | ICD-10-CM | POA: Diagnosis not present

## 2023-12-31 ENCOUNTER — Encounter: Payer: Self-pay | Admitting: *Deleted

## 2023-12-31 ENCOUNTER — Ambulatory Visit: Attending: Cardiology | Admitting: Cardiology

## 2023-12-31 ENCOUNTER — Encounter: Payer: Self-pay | Admitting: Cardiology

## 2023-12-31 VITALS — BP 134/54 | HR 65 | Ht 67.5 in | Wt 317.0 lb

## 2023-12-31 DIAGNOSIS — I890 Lymphedema, not elsewhere classified: Secondary | ICD-10-CM

## 2023-12-31 DIAGNOSIS — I2089 Other forms of angina pectoris: Secondary | ICD-10-CM | POA: Diagnosis not present

## 2023-12-31 DIAGNOSIS — I480 Paroxysmal atrial fibrillation: Secondary | ICD-10-CM

## 2023-12-31 MED ORDER — METOLAZONE 2.5 MG PO TABS: 2.5000 mg | ORAL_TABLET | ORAL | Status: AC

## 2023-12-31 MED ORDER — SPIRONOLACTONE 50 MG PO TABS: 50.0000 mg | ORAL_TABLET | Freq: Two times a day (BID) | ORAL | Status: AC

## 2023-12-31 NOTE — Patient Instructions (Addendum)
 Medication Instructions:   Change Metolazone  to every other day Decrease Aldactone  to twice a day  Continue all other medications.     Labwork:  none  Testing/Procedures:  none  Follow-Up:  4 months   Any Other Special Instructions Will Be Listed Below (If Applicable).   If you need a refill on your cardiac medications before your next appointment, please call your pharmacy.

## 2023-12-31 NOTE — Progress Notes (Signed)
"  ° ° °  Cardiology Office Note  Date: 12/31/2023   ID: KAYNA SUPPA, DOB 28-Oct-1960, MRN [id number]  History of Present Illness: [name] [name] is a 63 y.o. female last seen in September.  She is here for a follow-up visit.  Continues to be seen in the wound clinic, recently had Unna boot taken off left leg.  She follows up with [facility] with repeat vascular studies in the near future as well.  Does not report any increasing sense of palpitations to suggest worsening atrial fibrillation control.  Her weight is down, states that edema/lymphedema has been reasonably well-controlled.  I do see that she had recent lab work obtained during a neurology consultation showing acute on chronic kidney disease with creatinine up to 1.92, BUN 56, potassium 5.1.  We went over her medications today and I discussed gradual down titration of diuretics.  She is scheduled to see nephrology in February as well.  Reports no spontaneous bleeding problems on Xarelto .  Physical Exam: VS:  BP (!) 134/54   Pulse 65   Ht 5' 7.5 (1.715 m)   Wt (!) 317 lb (143.8 kg)   SpO2 96%   BMI 48.92 kg/m , BMI Body mass index is 48.92 kg/m.  Wt Readings from Last 3 Encounters:  12/31/23 (!) 317 lb (143.8 kg)  12/20/23 (!) 325 lb (147.4 kg)  09/19/23 (!) 329 lb (149.2 kg)    General: Patient appears comfortable at rest.  In motorized wheelchair. HEENT: Conjunctiva and lids normal. Neck: Supple, no elevated JVP or carotid bruits. Lungs: Clear to auscultation, nonlabored breathing at rest. Cardiac: RRR with 2/6 systolic murmur, no gallop. Extremities: Chronic appearing lymphedema with venous stasis.  ECG:  An ECG dated 08/31/2023 was personally reviewed today and demonstrated:  Sinus rhythm with prolonged PR interval and IVCD, QRS duration 130 ms.  Labwork: 05/02/2023: B Natriuretic Peptide 13.0 12/20/2023: ALT 9; AST 16; BUN 56; Creatinine, Ser 1.92; Hemoglobin 9.6; Platelets 314; Potassium 5.1; Sodium 136; TSH 2.040    Other Studies Reviewed Today:  No interval cardiac testing for review today.  Assessment and Plan:  1.  Paroxysmal atrial fibrillation with CHA2DS2-VASc score of 3.  Symptomatically stable on current medical regimen including flecainide  50 mg twice daily, Xarelto  20 mg daily, and diltiazem  CD100 and 20 mg daily.  I reviewed her most recent lab work.   2.  Lymphedema.  She has had recurring problems with leg edema, cellulitis, and venous stasis ulcers.  Currently following with wound care and also [facility].  Mechanical compression has been fairly effective, she is also on high-dose diuretics which are being adjusted as discussed below.   3.  OSA on BiPAP.   4.  Intermittent chest pain responsive to nitroglycerin .  Cardiac catheterization from 2020 showed no significant CAD.  Likely has component of microvascular angina.   5.  Fluid retention, possible HFpEF.  LVEF 70 to 75% by echocardiogram in January with mild diastolic dysfunction at that time and normal RV contraction with normal estimated RVSP.  She has acute on chronic kidney disease, weight is down relative to last check.  Plan to decrease Aldactone  to 50 mg twice daily, decrease metolazone  to 2.5 mg every other day, and continue Demadex  40 mg twice daily.  Disposition:  Follow up 4 months.  Signed, Jayson JUDITHANN Sierras, M.D., F.A.C.C. [facility]

## 2024-01-01 ENCOUNTER — Encounter: Payer: Self-pay | Admitting: Vascular Surgery

## 2024-01-01 ENCOUNTER — Ambulatory Visit (HOSPITAL_COMMUNITY)
Admission: RE | Admit: 2024-01-01 | Discharge: 2024-01-01 | Disposition: A | Discharge status: Home / Self Care | Source: Ambulatory Visit | Attending: Vascular Surgery | Admitting: Vascular Surgery

## 2024-01-01 ENCOUNTER — Ambulatory Visit (INDEPENDENT_AMBULATORY_CARE_PROVIDER_SITE_OTHER): Admitting: Vascular Surgery

## 2024-01-01 VITALS — BP 102/64 | HR 64 | Temp 97.9°F

## 2024-01-01 DIAGNOSIS — I872 Venous insufficiency (chronic) (peripheral): Secondary | ICD-10-CM | POA: Diagnosis not present

## 2024-01-01 DIAGNOSIS — I739 Peripheral vascular disease, unspecified: Secondary | ICD-10-CM | POA: Diagnosis not present

## 2024-01-01 DIAGNOSIS — M7989 Other specified soft tissue disorders: Secondary | ICD-10-CM

## 2024-01-01 LAB — VAS US ABI WITH/WO TBI
Left ABI: 0.92
Right ABI: 0.65

## 2024-01-01 NOTE — Progress Notes (Signed)
 [facility] [place]  ASSESSMENT / PLAN: [name] [name] is a 63 y.o. female with resolution of  mixed disease of bilateral lower extremities.  Follow up as needed.   CHIEF COMPLAINT: Evaluate ulcers of bilateral lower extremities  HISTORY OF PRESENT ILLNESS: [name] [name] is a 63 y.o. female referred to clinic for evaluation of bilateral lower extremity ulceration.  On the left she has a calf venous ulcer which is nearly healed with Unna boot therapy.  On the right she has 1/5 toe dorsal pressure ulcer which appears either neuropathic or ischemic.  This is also nearly healed.  On noninvasive testing in outside hospital, she has moderate to severe peripheral arterial disease.  Patient is nonambulatory and moves mostly with a wheelchair.  She has Charcot neuropathy of her bilateral lower extremities.  01/01/24: No issues in clinic today. Reports resolution of ulceration.   Past Medical History:  Diagnosis Date   Anemia    Bladder dysfunction    Self urinary catheterization   Chronic pain syndrome    long term and current opiates   Complication of anesthesia    COPD (chronic obstructive pulmonary disease) (HCC)    DDD (degenerative disc disease)    Depression    Dysrhythmia    Bradycardia, A.fib   Esophageal spasm    NTG and Norvasc   Essential hypertension    Fatty liver    2025   Fibromyalgia    Gastroparesis    GERD (gastroesophageal reflux disease)    History of cardiac catheterization    Normal coronary arteries   History of pituitary tumor    Irritable bowel syndrome    Lymphedema    Osteoarthritis    PAF (paroxysmal atrial fibrillation) (HCC)    Pneumonia    PONV (postoperative nausea and vomiting)    Psoriatic arthritis (HCC)    Rheumatoid arthritis (HCC)    Sleep apnea    BIPAP   Type 2 diabetes mellitus (HCC)    Urinary tract infection     Past Surgical History:  Procedure Laterality Date   ABDOMINAL HYSTERECTOMY  1997   ANTERIOR  CERVICAL DECOMP/DISCECTOMY FUSION N/A 12/02/2014   Procedure: Cervical five cervical six anterior cervical decompression with fusion interbody prosthesis plating and bone graft;  Surgeon: Reyes Budge, MD;  Location: [facility];  Service: Neurosurgery;  Laterality: N/A;  C56 anterior cervical decompression with fusion interbody prosthesis plating and bonegraft   APPENDECTOMY  09/1997   BACK SURGERY     lumbar    1991 and 2009   CARDIAC CATHETERIZATION     2020   CARDIOVERSION N/A 03/26/2014   Procedure: CARDIOVERSION;  Surgeon: Pearla DELENA Rout, MD;  Location: [facility];  Service: Endoscopy;  Laterality: N/A;   CARDIOVERSION N/A 05/04/2014   Procedure: CARDIOVERSION;  Surgeon: Jayson KANDICE Sierras, MD;  Location: [facility];  Service: Cardiovascular;  Laterality: N/A;   CHOLECYSTECTOMY  1996   laparoscopic   COLONOSCOPY     Approximately 2003. Per medical records, internal hemorrhoids noted   COLONOSCOPY N/A 06/25/2013   Dr. Shaaron: Anal canal hemorrhoids-more likely the source of paper hematochezia. Redundant, capacious colon. Multiple colonic polyps-tubular adenoma   COLONOSCOPY WITH PROPOFOL  N/A 12/04/2016   Dr. Shaaron: redundant colon, colonic diverticulosis. Screening due in 2023   COLONOSCOPY WITH PROPOFOL  N/A 08/11/2021   Procedure: COLONOSCOPY WITH PROPOFOL ;  Surgeon: Shaaron Lamar HERO, MD;  Location: [facility];  Service: Endoscopy;  Laterality: N/A;  9:15am   ENDOVENOUS ABLATION  SAPHENOUS VEIN W/ LASER Left 03/07/2018   endovenous ablation left greater saphenous vein by Lonni Blade MD    ESOPHAGOGASTRODUODENOSCOPY  10/12/2004   MFM:Qpwz plaquing on the esophageal mucosa of uncertain significance, not typical of what is seen with candida esophagitis status post KOH brushing for KOH prep and biopsy for histology. Rule out candida esophagitis/eosinophilic esophagitis. Otherwise normal esophagus. Tiny hiatal hernia. Otherwise, normal stomach, normal D1 and D2. Benign biopsy of  esophagus, unknown KOH status.    ESOPHAGOGASTRODUODENOSCOPY N/A 06/25/2013   Dr. Rourk:mild chronic gastritis   HAND SURGERY Left 2012   ganglion excision and nerve repair   KNEE SURGERY Left 1997   arthroscopy and debridement   LEFT HEART CATHETERIZATION WITH CORONARY ANGIOGRAM N/A 11/15/2010   Procedure: LEFT HEART CATHETERIZATION WITH CORONARY ANGIOGRAM;  Surgeon: Erick JONELLE Bergamo, MD;  Location: [facility] C[facility];  Service: Cardiovascular;  Laterality: N/A;   LUNG BIOPSY  2007   benign per patient   POLYPECTOMY  08/11/2021   Procedure: POLYPECTOMY INTESTINAL;  Surgeon: Shaaron Lamar HERO, MD;  Location: [facility];  Service: Endoscopy;;   RIGHT/LEFT HEART CATH AND CORONARY ANGIOGRAPHY N/A 08/28/2018   Procedure: RIGHT/LEFT HEART CATH AND CORONARY ANGIOGRAPHY;  Surgeon: Verlin Lonni BIRCH, MD;  Location: [facility];  Service: Cardiovascular;  Laterality: N/A;   SHOULDER ARTHROSCOPY W/ ROTATOR CUFF REPAIR Left 02/16/2016    Family History  Problem Relation Age of Onset   Coronary artery disease Father        Died age 33   Heart attack Father    Arrhythmia Father        AF   Diabetes Father    Parkinson's disease Father    Arrhythmia Mother        AF   Stroke Mother    Dementia Mother    Cancer Mother        UTERINE    Parkinson's disease Mother    Arrhythmia Brother        AF   Colon cancer Maternal Grandfather    Colon cancer Paternal Aunt    Colon cancer Paternal Aunt    Diabetes Son    Cancer Sister        BREAST    Depression Sister    Breast cancer Sister    Depression Sister    Depression Sister    Breast cancer Maternal Aunt     Social History   Socioeconomic History   Marital status: Divorced    Spouse name: Not on file   Number of children: 1   Years of education: Not on file   Highest education level: Not on file  Occupational History   Not on file  Tobacco Use   Smoking status: Former    Current packs/day: 0.00    Average packs/day:  1.5 packs/day for 30.0 years (45.0 ttl pk-yrs)    Types: Cigarettes    Start date: 03/06/1977    Quit date: 01/03/2007    Years since quitting: 17.0   Smokeless tobacco: Never  Vaping Use   Vaping status: Never Used  Substance and Sexual Activity   Alcohol  use: No    Alcohol /week: 0.0 standard drinks of alcohol    Drug use: No   Sexual activity: Not Currently  Other Topics Concern   Not on file  Social History Narrative   Not on file   Social Drivers of Health   Tobacco Use: Medium Risk (01/01/2024)   Patient History    Smoking Tobacco Use:  Former    Smokeless Tobacco Use: Never    Passive Exposure: Not on Actuary Strain: Low Risk (09/22/2022)   Overall Financial Resource Strain (CARDIA)    Difficulty of Paying Living Expenses: Not very hard  Food Insecurity: No Food Insecurity (05/03/2023)   Hunger Vital Sign    Worried About Running Out of Food in the Last Year: Never true    Ran Out of Food in the Last Year: Never true  Transportation Needs: No Transportation Needs (05/03/2023)   PRAPARE - Administrator, Civil Service (Medical): No    Lack of Transportation (Non-Medical): No  Physical Activity: Inactive (08/18/2022)   Exercise Vital Sign    Days of Exercise per Week: 0 days    Minutes of Exercise per Session: 0 min  Stress: Not on file  Social Connections: Patient Declined (05/03/2023)   Social Connection and Isolation Panel    Frequency of Communication with Friends and Family: Patient declined    Frequency of Social Gatherings with Friends and Family: Patient declined    Attends Religious Services: Patient declined    Database Administrator or Organizations: Patient declined    Attends Banker Meetings: Patient declined    Marital Status: Patient declined  Intimate Partner Violence: Not At Risk (05/03/2023)   Humiliation, Afraid, Rape, and Kick questionnaire    Fear of Current or Ex-Partner: No    Emotionally Abused: No     Physically Abused: No    Sexually Abused: No  Depression (PHQ2-9): Low Risk (10/15/2023)   Depression (PHQ2-9)    PHQ-2 Score: 0  Alcohol  Screen: Not on file  Housing: Low Risk (05/03/2023)   Housing Stability Vital Sign    Unable to Pay for Housing in the Last Year: No    Number of Times Moved in the Last Year: 0    Homeless in the Last Year: No  Utilities: Not At Risk (05/03/2023)   AHC Utilities    Threatened with loss of utilities: No  Health Literacy: Adequate Health Literacy (08/18/2022)   B1300 Health Literacy    Frequency of need for help with medical instructions: Never    Allergies  Allergen Reactions   Tape Other (See Comments)    Welts, bad place on skin. Paper tape is okay   Wound Dressing Adhesive Hives    Welts, bad place on skin. Paper tape is okay   Hyoscyamine Sulfate Palpitations   Metoclopramide Other (See Comments)    Tremors     Current Outpatient Medications  Medication Sig Dispense Refill   albuterol  (VENTOLIN  HFA) 108 (90 Base) MCG/ACT inhaler Inhale 2 puffs into the lungs.     atorvastatin  (LIPITOR) 40 MG tablet Take 40 mg by mouth daily.     baclofen  (LIORESAL ) 20 MG tablet Take 20 mg by mouth 3 (three) times daily.     dexlansoprazole  (DEXILANT ) 60 MG capsule TAKE ONE CAPSULE BY MOUTH ONCE A DAY BEFORE BREAKFAST 90 capsule 0   diclofenac sodium (VOLTAREN) 1 % GEL Apply 2-4 g topically 4 (four) times daily as needed (for pain).      diltiazem  (CARDIZEM  CD) 120 MG 24 hr capsule TAKE ONE CAPSULE BY MOUTH EVERY DAY 90 capsule 3   docusate sodium  (COLACE) 100 MG capsule Take 100 mg by mouth 2 (two) times daily.     ferrous sulfate 325 (65 FE) MG EC tablet Take 325 mg by mouth daily with breakfast.     flecainide  (TAMBOCOR ) 50  MG tablet TAKE 2 TABLETS BY MOUTH EVERY MORNING and TAKE 2 TABLETS BY MOUTH EVERY EVENING 360 tablet 3   fluticasone  (FLONASE ) 50 MCG/ACT nasal spray INSTILL 2 SPRAYS IN EACH NOSTRIL EVERY DAY 16 g 6   gabapentin  (NEURONTIN ) 600 MG  tablet Take 600 mg by mouth 3 (three) times daily.     glimepiride (AMARYL) 4 MG tablet Take 4 mg by mouth 2 (two) times daily.     hydrocortisone  (ANUSOL -HC) 2.5 % rectal cream Place 1 Application rectally 2 (two) times daily. For rectal bleeding 30 g 1   isosorbide  mononitrate (IMDUR ) 120 MG 24 hr tablet Take 120 mg by mouth daily. In the morning     isosorbide  mononitrate (IMDUR ) 30 MG 24 hr tablet Take 30 mg by mouth every evening.     LINZESS  290 MCG CAPS capsule TAKE ONE CAPSULE BY MOUTH DAILY BEFORE BREAKFAST 90 capsule 3   lisinopril  (PRINIVIL ,ZESTRIL ) 10 MG tablet Take 10 mg by mouth every evening.      loratadine  (CLARITIN ) 10 MG tablet Take 1 tablet (10 mg total) by mouth daily. 30 tablet 11   meclizine  (ANTIVERT ) 25 MG tablet Take 25 mg by mouth 3 (three) times daily.     metFORMIN  (GLUCOPHAGE ) 500 MG tablet Take 500 mg by mouth daily with breakfast.     metolazone  (ZAROXOLYN ) 2.5 MG tablet Take 1 tablet (2.5 mg total) by mouth every other day. 30 Minutes before taking your torsemide .     MOTEGRITY  2 MG TABS TAKE 1 TABLET BY MOUTH EVERY DAY 90 tablet 3   Multiple Vitamin (MULTIVITAMIN WITH MINERALS) TABS tablet Take 1 tablet by mouth daily.     mupirocin  ointment (BACTROBAN ) 2 % Apply 1 Application topically in the morning and at bedtime.     mupirocin  ointment (BACTROBAN ) 2 % Apply once daily to the wound beds 30 g 2   nitroGLYCERIN  (NITROLINGUAL ) 0.4 MG/SPRAY spray USE 1 SPRAY UNDER THE TONGUE EVERY 5 MINUTES UP TO 3 DOSES AS NEEDED FOR CHEST PAIN. GO TO ER IF NOT RESOLVED 12 g 3   NUCYNTA  ER 100 MG 12 hr tablet Take 100 mg by mouth 2 (two) times daily.     nystatin  (MYCOSTATIN /NYSTOP ) 100000 UNIT/GM POWD Apply 1 g topically daily.     ondansetron  (ZOFRAN -ODT) 8 MG disintegrating tablet Take 8 mg by mouth daily.     OTEZLA  30 MG TABS Take 1 tablet by mouth 2 (two) times daily.     oxyCODONE -acetaminophen  (PERCOCET) 7.5-325 MG tablet Take 1 tablet by mouth every 6 (six) hours as  needed.     OXYGEN  Inhale 2 L into the lungs as needed (shortness of breath).     OZEMPIC, 1 MG/DOSE, 4 MG/3ML SOPN Inject 1 mg into the skin every Tuesday.     Prucalopride Succinate  (MOTEGRITY ) 2 MG TABS Take 1 tablet (2 mg total) by mouth daily. 90 tablet 3   rivaroxaban  (XARELTO ) 20 MG TABS tablet Take 20 mg by mouth daily with supper.      sodium chloride  (OCEAN) 0.65 % SOLN nasal spray Place 1 spray into both nostrils daily.     spironolactone  (ALDACTONE ) 50 MG tablet Take 1 tablet (50 mg total) by mouth 2 (two) times daily.     torsemide  40 MG TABS Take 40 mg by mouth 2 (two) times daily. 180 tablet 1   TOUJEO  SOLOSTAR 300 UNIT/ML Solostar Pen Inject 43 Units into the skin at bedtime.     umeclidinium-vilanterol (ANORO ELLIPTA ) 62.5-25 MCG/ACT  AEPB Inhale 1 puff into the lungs daily. 60 each 11   VASCEPA  1 g CAPS Take 1 g by mouth 4 (four) times daily.     No current facility-administered medications for this visit.    PHYSICAL EXAM Vitals:   01/01/24 1329  BP: 102/64  Pulse: 64  Temp: 97.9 F (36.6 C)  SpO2: 99%   Appears older than stated age.  No acute distress Regular rate and rhythm In the breathing Obese abdomen.  In wheelchair Bilateral feet are warm and pink.  All ulcers appear healed. R dorsal 5th toe  PERTINENT LABORATORY AND RADIOLOGIC DATA  Most recent CBC    Latest Ref Rng & Units 12/20/2023    2:28 PM 09/14/2023    1:45 PM 05/18/2023    2:01 PM  CBC  WBC 3.4 - 10.8 x10E3/uL 12.1  10.8  11.5   Hemoglobin 11.1 - 15.9 g/dL 9.6  9.7  9.6   Hematocrit 34.0 - 46.6 % 29.8  30.1  30.8   Platelets 150 - 450 x10E3/uL 314  345  336      Most recent CMP    Latest Ref Rng & Units 12/20/2023    2:28 PM 09/14/2023    1:45 PM 05/18/2023    2:01 PM  CMP  Glucose 70 - 99 mg/dL 67  79  897   BUN 8 - 27 mg/dL 56  33  40   Creatinine 0.57 - 1.00 mg/dL 8.07  8.67  8.93   Sodium 134 - 144 mmol/L 136  136  134   Potassium 3.5 - 5.2 mmol/L 5.1  4.1  4.5   Chloride 96  - 106 mmol/L 96  98  99   CO2 20 - 29 mmol/L 26  27  26    Calcium  8.7 - 10.3 mg/dL 89.7  9.9  9.3   Total Protein 6.0 - 8.5 g/dL 7.1  7.6  7.0   Total Bilirubin 0.0 - 1.2 mg/dL 0.2  0.4  0.5   Alkaline Phos 49 - 135 IU/L 79  65  63   AST 0 - 40 IU/L 16  17  16    ALT 0 - 32 IU/L 9  12  13      Renal function Estimated Creatinine Clearance: 45.7 mL/min (A) (by C-G formula based on SCr of 1.92 mg/dL (H)).  Hgb A1c MFr Bld (%)  Date Value  05/02/2023 5.8 (H)    LOWER EXTREMITY DOPPLER STUDY   Patient Name:  [name] [name]  Date of Exam:   01/01/2024  Medical Rec #: [id number]        Accession #:    [id number]  Date of Birth: 02/26/1960        Patient Gender: F  Patient Age:   63 years  Exam Location:  [address]  Procedure:      VAS US  ABI WITH/WO TBI  Referring Phys: DEBBY ROBERTSON    ---------------------------------------------------------------------------  -----    Indications: Peripheral artery disease. Previous ulceration of the right  5th toe               whicn now appears healed.               Mixed venous insufficiency     Limitations: Today's exam was limited due to Mobility and patient  positioning.   Performing Technologist: Geni Lodge RVS, RCS     Examination Guidelines: A complete evaluation includes at minimum, Doppler  waveform signals and systolic blood pressure  reading at the level of  bilateral  brachial, anterior tibial, and posterior tibial arteries, when vessel  segments  are accessible. Bilateral testing is considered an integral part of a  complete  examination. Photoelectric Plethysmograph (PPG) waveforms and toe systolic  pressure readings are included as required and additional duplex testing  as  needed. Limited examinations for reoccurring indications may be performed  as  noted.     ABI Findings:  +---------+------------------+-----+--------+--------+  Right   Rt Pressure (mmHg)IndexWaveformComment    +---------+------------------+-----+--------+--------+  Brachial 121                                      +---------+------------------+-----+--------+--------+  PTA     88                0.63 biphasic          +---------+------------------+-----+--------+--------+  DP      91                0.65 biphasic          +---------+------------------+-----+--------+--------+  Great Toe68                0.49                   +---------+------------------+-----+--------+--------+   +---------+------------------+-----+--------+-------+  Left    Lt Pressure (mmHg)IndexWaveformComment  +---------+------------------+-----+--------+-------+  Brachial 139                                     +---------+------------------+-----+--------+-------+  PTA     128               0.92 biphasic         +---------+------------------+-----+--------+-------+  DP      109               0.78 biphasic         +---------+------------------+-----+--------+-------+  Great Toe74                0.53                  +---------+------------------+-----+--------+-------+   +-------+-----------+-----------+------------+------------+  ABI/TBIToday's ABIToday's TBIPrevious ABIPrevious TBI  +-------+-----------+-----------+------------+------------+  Right 0.65       0.49                                 +-------+-----------+-----------+------------+------------+  Left  0.92       0.53                                 +-------+-----------+-----------+------------+------------+         Toe pressures may be inaccurate due to size of toe and toe cuff placement.    Summary:  Right: Resting right ankle-brachial index indicates moderate right lower  extremity arterial disease.  Right toe pressure is >60 mmHg which suggests adequate perfusion for  healing.    Left: Resting left ankle-brachial index indicates mild left lower  extremity arterial  disease.  Left toe pressure is >60 mmHg which suggests adequate perfusion for  healing.   *See table(s) above for measurements and observations.    Debby SAILOR. Magda, MD FACS [facility] Phone Number: [phone] 01/01/2024 1:33 PM   Total time spent on preparing this encounter including chart review, data review, collecting history, examining the patient, and coordinating care: 45 minutes  Portions of this report may have been transcribed using voice recognition software.  Every effort has been made to ensure accuracy; however, inadvertent computerized transcription errors may still be present.

## 2024-01-02 ENCOUNTER — Inpatient Hospital Stay: Admitting: Oncology

## 2024-01-03 ENCOUNTER — Other Ambulatory Visit: Payer: Self-pay | Admitting: Nurse Practitioner

## 2024-01-09 ENCOUNTER — Encounter (HOSPITAL_BASED_OUTPATIENT_CLINIC_OR_DEPARTMENT_OTHER): Attending: Internal Medicine | Admitting: Internal Medicine

## 2024-01-09 DIAGNOSIS — X58XXXS Exposure to other specified factors, sequela: Secondary | ICD-10-CM | POA: Diagnosis not present

## 2024-01-09 DIAGNOSIS — I890 Lymphedema, not elsewhere classified: Secondary | ICD-10-CM | POA: Insufficient documentation

## 2024-01-09 DIAGNOSIS — Z6841 Body Mass Index (BMI) 40.0 and over, adult: Secondary | ICD-10-CM | POA: Diagnosis not present

## 2024-01-09 DIAGNOSIS — L89892 Pressure ulcer of other site, stage 2: Secondary | ICD-10-CM | POA: Insufficient documentation

## 2024-01-09 DIAGNOSIS — I7025 Atherosclerosis of native arteries of other extremities with ulceration: Secondary | ICD-10-CM | POA: Insufficient documentation

## 2024-01-09 DIAGNOSIS — S81801S Unspecified open wound, right lower leg, sequela: Secondary | ICD-10-CM | POA: Diagnosis not present

## 2024-01-12 ENCOUNTER — Other Ambulatory Visit: Payer: Self-pay | Admitting: Gastroenterology

## 2024-01-16 ENCOUNTER — Encounter (HOSPITAL_BASED_OUTPATIENT_CLINIC_OR_DEPARTMENT_OTHER): Admitting: Internal Medicine

## 2024-01-16 DIAGNOSIS — S81801S Unspecified open wound, right lower leg, sequela: Secondary | ICD-10-CM

## 2024-01-16 DIAGNOSIS — L89892 Pressure ulcer of other site, stage 2: Secondary | ICD-10-CM | POA: Diagnosis not present

## 2024-01-30 ENCOUNTER — Inpatient Hospital Stay

## 2024-01-31 ENCOUNTER — Encounter (HOSPITAL_BASED_OUTPATIENT_CLINIC_OR_DEPARTMENT_OTHER): Admitting: Internal Medicine

## 2024-02-03 ENCOUNTER — Other Ambulatory Visit: Payer: Self-pay | Admitting: Nurse Practitioner

## 2024-02-06 ENCOUNTER — Inpatient Hospital Stay: Admitting: Oncology

## 2024-02-06 NOTE — Telephone Encounter (Signed)
 Creatinine 1.92 on 12/20/23  In accordance with refill protocols, please review and address the following requirements before this medication refill can be authorized:  Labs

## 2024-02-07 ENCOUNTER — Encounter (HOSPITAL_BASED_OUTPATIENT_CLINIC_OR_DEPARTMENT_OTHER): Admitting: Internal Medicine

## 2024-02-12 ENCOUNTER — Ambulatory Visit: Admitting: Gastroenterology

## 2024-02-13 ENCOUNTER — Encounter (HOSPITAL_BASED_OUTPATIENT_CLINIC_OR_DEPARTMENT_OTHER): Admitting: Internal Medicine

## 2024-02-28 ENCOUNTER — Inpatient Hospital Stay: Attending: Oncology

## 2024-04-03 ENCOUNTER — Inpatient Hospital Stay: Admitting: Oncology
